# Patient Record
Sex: Male | Born: 1975 | Race: Black or African American | Hispanic: No | Smoking: Current every day smoker
Health system: Southern US, Community
[De-identification: ages and names within clinical notes are randomized; demographics above are authoritative.]

## PROBLEM LIST (undated history)

## (undated) DIAGNOSIS — E7800 Pure hypercholesterolemia, unspecified: Secondary | ICD-10-CM

## (undated) DIAGNOSIS — R569 Unspecified convulsions: Secondary | ICD-10-CM

## (undated) DIAGNOSIS — G4730 Sleep apnea, unspecified: Secondary | ICD-10-CM

## (undated) DIAGNOSIS — K219 Gastro-esophageal reflux disease without esophagitis: Secondary | ICD-10-CM

## (undated) DIAGNOSIS — I10 Essential (primary) hypertension: Secondary | ICD-10-CM

## (undated) DIAGNOSIS — E119 Type 2 diabetes mellitus without complications: Secondary | ICD-10-CM

## (undated) DIAGNOSIS — K852 Alcohol induced acute pancreatitis without necrosis or infection: Secondary | ICD-10-CM

## (undated) DIAGNOSIS — F32A Depression, unspecified: Secondary | ICD-10-CM

## (undated) DIAGNOSIS — F1010 Alcohol abuse, uncomplicated: Secondary | ICD-10-CM

## (undated) DIAGNOSIS — F329 Major depressive disorder, single episode, unspecified: Secondary | ICD-10-CM

## (undated) DIAGNOSIS — Z8739 Personal history of other diseases of the musculoskeletal system and connective tissue: Secondary | ICD-10-CM

## (undated) HISTORY — DX: Alcohol induced acute pancreatitis without necrosis or infection: K85.20

## (undated) HISTORY — DX: Personal history of other diseases of the musculoskeletal system and connective tissue: Z87.39

## (undated) HISTORY — DX: Type 2 diabetes mellitus without complications: E11.9

## (undated) HISTORY — PX: LUMBAR MICRODISCECTOMY: SHX99

## (undated) HISTORY — DX: Essential (primary) hypertension: I10

---

## 2001-02-06 ENCOUNTER — Emergency Department (HOSPITAL_COMMUNITY): Admission: EM | Admit: 2001-02-06 | Discharge: 2001-02-06 | Payer: Self-pay | Admitting: Emergency Medicine

## 2005-12-19 ENCOUNTER — Ambulatory Visit (HOSPITAL_COMMUNITY): Admission: RE | Admit: 2005-12-19 | Discharge: 2005-12-20 | Payer: Self-pay | Admitting: Neurological Surgery

## 2009-03-22 ENCOUNTER — Emergency Department (HOSPITAL_COMMUNITY): Admission: EM | Admit: 2009-03-22 | Discharge: 2009-03-22 | Payer: Self-pay | Admitting: Family Medicine

## 2009-03-22 ENCOUNTER — Inpatient Hospital Stay (HOSPITAL_COMMUNITY): Admission: EM | Admit: 2009-03-22 | Discharge: 2009-03-28 | Payer: Self-pay | Admitting: Emergency Medicine

## 2009-03-22 ENCOUNTER — Ambulatory Visit: Payer: Self-pay | Admitting: Infectious Diseases

## 2009-03-23 ENCOUNTER — Ambulatory Visit: Payer: Self-pay | Admitting: Internal Medicine

## 2009-07-10 ENCOUNTER — Encounter: Payer: Self-pay | Admitting: Internal Medicine

## 2010-01-18 ENCOUNTER — Emergency Department (HOSPITAL_COMMUNITY): Admission: EM | Admit: 2010-01-18 | Discharge: 2010-01-18 | Payer: Self-pay | Admitting: Emergency Medicine

## 2010-04-13 ENCOUNTER — Emergency Department (HOSPITAL_BASED_OUTPATIENT_CLINIC_OR_DEPARTMENT_OTHER)
Admission: EM | Admit: 2010-04-13 | Discharge: 2010-04-14 | Payer: Self-pay | Source: Home / Self Care | Admitting: Emergency Medicine

## 2010-04-14 ENCOUNTER — Encounter: Payer: Self-pay | Admitting: Internal Medicine

## 2010-04-14 ENCOUNTER — Inpatient Hospital Stay (HOSPITAL_COMMUNITY)
Admission: EM | Admit: 2010-04-14 | Discharge: 2010-04-17 | Payer: Self-pay | Attending: Internal Medicine | Admitting: Internal Medicine

## 2010-05-22 ENCOUNTER — Ambulatory Visit
Admission: RE | Admit: 2010-05-22 | Discharge: 2010-05-22 | Payer: Self-pay | Source: Home / Self Care | Attending: Internal Medicine | Admitting: Internal Medicine

## 2010-05-22 DIAGNOSIS — I10 Essential (primary) hypertension: Secondary | ICD-10-CM | POA: Insufficient documentation

## 2010-05-22 DIAGNOSIS — E1165 Type 2 diabetes mellitus with hyperglycemia: Secondary | ICD-10-CM | POA: Insufficient documentation

## 2010-05-22 LAB — HM DIABETES FOOT EXAM

## 2010-06-14 NOTE — Assessment & Plan Note (Signed)
Summary: new to est/ umr/mhf   Vital Signs:  Patient profile:   35 year old male Height:      70.5 inches Weight:      261.50 pounds BMI:     37.12 O2 Sat:      99 % on Room air Temp:     97.5 degrees F oral Pulse rate:   65 / minute Resp:     18 per minute BP sitting:   140 / 90  Vitals Entered By: Glendell Docker CMA (May 22, 2010 2:40 PM)  O2 Flow:  Room air CC: New Patient  Is Patient Diabetic? Yes Pain Assessment Patient in pain? no      Comments needs refill on Metformin, blood pressure has been elevated, newly diagnosed DM in hospital   Primary Care Provider:  D. Thomos Lemons DO  CC:  New Patient .  History of Present Illness: 35 y/o male to establish he as hx of recurrent alcoholic pancreatitis on last hosp - they noted elevated A1c ( new onset DM II) has not been to diabetic educator  graduated college weighed 200 lbs  - current wt 261 lbs poor diet   Preventive Screening-Counseling & Management  Alcohol-Tobacco     Alcohol drinks/day: 0     Smoking Status: never  Caffeine-Diet-Exercise     Caffeine use/day: 2 beverages daily     Does Patient Exercise: yes     Type of exercise: walking     Times/week: 5  Allergies (verified): No Known Drug Allergies  Past History:  Past Medical History: Hx of recurrent alcoholic pancreatitis Diabetes mellitus, type II Hypertension Hx of low back pain with  herniated disc L5 S1 with  right lumbar radiculopathy s/p right lumbar microdiskectomy 11/2001    Dr. 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  The patient denies vision  loss, chest pain, dyspnea on exertion, abdominal pain, melena, hematochezia, severe indigestion/heartburn, and depression.    Physical Exam  General:  alert, well-developed, and well-nourished.   Head:  normocephalic and atraumatic.   Eyes:  pupils equal, pupils round, and pupils reactive to light.   Ears:  R ear normal and L ear normal.   Mouth:  pharynx pink and moist.   Neck:  No deformities, masses, or tenderness noted. Lungs:  normal respiratory effort and normal breath sounds.   Heart:  normal rate, regular rhythm, no murmur, and no gallop.   Abdomen:  soft, non-tender, normal bowel sounds, no masses, no hepatomegaly, and no splenomegaly.   Extremities:  trace left pedal edema and trace right pedal edema.   Neurologic:  cranial nerves II-XII intact and gait normal.    Diabetes Management Exam:    Foot Exam (with socks and/or shoes not present):       Inspection:          Left foot: normal          Right foot: normal   Impression & Recommendations:  Problem # 1:  DIABETES MELLITUS, TYPE II (ICD-250.00) Assessment New start metformin.  DM II education provided. Pt  counseled on diet and exercise.  The following medications were removed from the medication list:    Metformin Hcl 500 Mg Tabs (Metformin hcl) .Marland Kitchen... Take 1 tablet by mouth two times a day with meals His updated medication list for this problem includes:    Benicar Hct 20-12.5 Mg Tabs (Olmesartan medoxomil-hctz) ..... One by mouth qam    Metformin Hcl 500 Mg Tabs (Metformin hcl) .Marland Kitchen... 1 and 1/2 tab by mouth two times a day  Problem # 2:  HYPERTENSION (ICD-401.9) poorly controlled.  add benicar hctz  His updated medication list for this problem includes:    Zebeta 10 Mg Tabs (Bisoprolol fumarate) .Marland Kitchen... Take 1 tab by mouth at bedtime    Benicar Hct 20-12.5 Mg Tabs (Olmesartan medoxomil-hctz) ..... One by mouth qam  BP today: 140/90  Complete Medication List: 1)  Nexium 40 Mg Cpdr (Esomeprazole magnesium) ....  Take 1 capsule by mouth at bedtime 2)  Alprazolam 0.5 Mg Tabs (Alprazolam) .... 1/2 tablet by mouth  every morning and one tablet by mouth at bedtime as needed 3)  Zebeta 10 Mg Tabs (Bisoprolol fumarate) .... Take 1 tab by mouth at bedtime 4)  Benicar Hct 20-12.5 Mg Tabs (Olmesartan medoxomil-hctz) .... One by mouth qam 5)  Metformin Hcl 500 Mg Tabs (Metformin hcl) .Marland Kitchen.. 1 and 1/2 tab by mouth two times a day 6)  Onetouch Ultra Blue Strp (Glucose blood) .... Use once daily as directed 7)  Onetouch Delica Lancets Misc (Lancets) .... Use once daily as directed  Patient Instructions: 1)  Please schedule a follow-up appointment in 1 month. 2)  BMP prior to visit, ICD-9:  401.9 3)  Hepatic Panel prior to visit, ICD-9: 250.00 4)  Lipid Panel prior to visit, ICD-9: 250.00 5)  TSH prior to visit, ICD-9: 401.9 6)  HbgA1C prior to visit, ICD-9: 250.00 7)  Urine Microalbumin prior to visit, ICD-9: 250.00 8)  Please return for lab work one (1) week before your next appointment.  Prescriptions: NEXIUM 40 MG CPDR (ESOMEPRAZOLE MAGNESIUM) Take 1 capsule by mouth at bedtime  #30 x 5   Entered and Authorized by:   D. Thomos Lemons DO   Signed by:   D. Thomos Lemons DO on 05/22/2010   Method used:   Electronically to        [facility]* (retail)       [address]       [address] Shipping/mailing       [place], [place]  [address]       Ph: [phone]       Fax: [phone]   RxID:   [id number] ALPRAZOLAM 0.5 MG TABS (ALPRAZOLAM) 1/2 tablet by mouth  every morning and one tablet by mouth at bedtime as needed  #30 x 3   Entered and Authorized by:   D. Thomos Lemons DO   Signed by:   D. Thomos Lemons DO on 05/22/2010   Method used:   Print then Give to Patient   RxID:   [id number] [facility] DELICA LANCETS  MISC (LANCETS) use once daily as directed  #30 x 3   Entered and Authorized by:   D. Thomos Lemons DO   Signed by:   D. Thomos Lemons DO on 05/22/2010   Method used:    Electronically to        [facility]* (retail)       [address]       [address] Shipping/mailing       [place], [place]  [address]       Ph: [phone]       Fax: [phone]   RxID:   [id number] ONETOUCH ULTRA BLUE  STRP (GLUCOSE BLOOD) use once daily as directed  #30 x 3   Entered and Authorized by:   D. Thomos Lemons DO   Signed by:   D. Thomos Lemons DO on 05/22/2010   Method used:   Electronically to        [facility]* (retail)       [address]       [address] Shipping/mailing       [place], [place]  [address]       Ph: [phone]       Fax: [phone]   RxID:   [id number] METFORMIN HCL 500 MG TABS (METFORMIN HCL) 1 and 1/2 tab by mouth two times a day  #90 x 3   Entered and Authorized by:   D. Thomos Lemons DO   Signed by:   D. Thomos Lemons DO on 05/22/2010   Method used:   Electronically to        [facility]* (retail)       [address]       [address] Shipping/mailing       [place], [place]  [address]       Ph: [phone]       Fax: [phone]   RxID:   [id number] BENICAR HCT 20-12.5 MG TABS (OLMESARTAN MEDOXOMIL-HCTZ) one by mouth qAM  #30 x 2   Entered and Authorized by:   D. Thomos Lemons DO   Signed by:   D. Thomos Lemons DO on 05/22/2010   Method used:   Electronically to        [facility]* (retail)       [address]       [address] Shipping/mailing       [place], [place]  [address]       Ph: [phone]       Fax: [phone]   RxID:   [id number]    Orders Added: 1)  New Patient Level III [614[address]]   Immunization History:  Influenza Immunization History:    Influenza:  historical (04/16/2010)   Immunization History:  Influenza Immunization History:    Influenza:  Historical (04/16/2010)   Current Allergies (reviewed today): No known allergies

## 2010-06-25 ENCOUNTER — Encounter: Payer: Self-pay | Admitting: Internal Medicine

## 2010-06-25 LAB — CONVERTED CEMR LAB
ALT: 39 units/L (ref 0–53)
AST: 27 units/L (ref 0–37)
Albumin: 5.1 g/dL (ref 3.5–5.2)
Bilirubin, Direct: 0.1 mg/dL (ref 0.0–0.3)
CO2: 28 meq/L (ref 19–32)
Cholesterol: 188 mg/dL (ref 0–200)
Glucose, Bld: 96 mg/dL (ref 70–99)
HDL: 37 mg/dL — ABNORMAL LOW (ref >39)
Hgb A1c MFr Bld: 6.3 % — ABNORMAL HIGH (ref <5.7)
Microalb Creat Ratio: 6.9 mg/g (ref 0.0–30.0)
Microalb, Ur: 0.78 mg/dL (ref 0.00–1.89)
Potassium: 3.8 meq/L (ref 3.5–5.3)
Sodium: 138 meq/L (ref 135–145)
TSH: 2.436 microintl units/mL (ref 0.350–4.500)
Total CHOL/HDL Ratio: 5.1
VLDL: 23 mg/dL (ref 0–40)

## 2010-06-27 ENCOUNTER — Encounter: Payer: Self-pay | Admitting: Internal Medicine

## 2010-06-27 ENCOUNTER — Ambulatory Visit (INDEPENDENT_AMBULATORY_CARE_PROVIDER_SITE_OTHER): Payer: 59 | Admitting: Internal Medicine

## 2010-06-27 DIAGNOSIS — I10 Essential (primary) hypertension: Secondary | ICD-10-CM

## 2010-06-27 DIAGNOSIS — E785 Hyperlipidemia, unspecified: Secondary | ICD-10-CM | POA: Insufficient documentation

## 2010-06-27 DIAGNOSIS — E119 Type 2 diabetes mellitus without complications: Secondary | ICD-10-CM

## 2010-07-05 NOTE — Assessment & Plan Note (Signed)
Summary: 1 month follow up/mhf   Vital Signs:  Patient profile:   35 year old male Height:      70.5 inches Weight:      257.25 pounds BMI:     36.52 O2 Sat:      99 % on Room air Temp:     98.8 degrees F oral Pulse rate:   71 / minute Resp:     20 per minute BP sitting:   130 / 80  (right arm) Cuff size:   large  Vitals Entered By: Glendell Docker CMA (June 27, 2010 10:00 AM)  O2 Flow:  Room air CC: 1 Month follow up Is Patient Diabetic? Yes Pain Assessment Patient in pain? no      Comments low blood sugar 90 high 119 avg 90's   Primary Care Provider:  Dondra Spry DO  CC:  1 Month follow up.  History of Present Illness: 35 y/o male for diabetes f/u doing well,  lost 4 lbs family working on eliminating sugars in diet tolerating metformin A1c improved  Htn - no side effects from benicar.  denies dizziness or light headedness reveiwed lipid panel LDL is suboptimal.  goal LDL < 70 he has never taken statin in the past      Preventive Screening-Counseling & Management  Alcohol-Tobacco     Smoking Status: never  Allergies (verified): No Known Drug Allergies  Past History:  Past Medical History: Hx of recurrent alcoholic pancreatitis Diabetes mellitus, type II - new onset 03/2010 Hypertension Hx of low back pain with  herniated disc L5 S1 with  right lumbar radiculopathy s/p right lumbar microdiskectomy 11/2001    Dr. 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   Lungs:  normal respiratory effort and normal breath sounds.   Heart:  normal rate, regular rhythm, no murmur, and no gallop.   Extremities:  trace left pedal edema and trace right pedal  edema.     Impression & Recommendations:  Problem # 1:  HYPERTENSION (ICD-401.9) Assessment Improved bp at goal.  Maintain current medication regimen.  His updated medication list for this problem includes:    Zebeta 10 Mg Tabs (Bisoprolol fumarate) .Marland Kitchen... Take 1 tab by mouth at bedtime    Benicar Hct 20-12.5 Mg Tabs (Olmesartan medoxomil-hctz) ..... One by mouth qam  BP today: 130/80 Prior BP: 140/90 (05/22/2010)  Labs Reviewed: K+: 3.8 (06/25/2010) Creat: : 0.97 (06/25/2010)   Chol: 188 (06/25/2010)   HDL: 37 (06/25/2010)   LDL: 128 (06/25/2010)   TG: 115 (06/25/2010)  Problem # 2:  DIABETES MELLITUS, TYPE II (ICD-250.00) Assessment: Improved  add statin, and baby aspirin encouraged daily exercise program  His updated medication list for this problem includes:    Benicar Hct 20-12.5 Mg Tabs (Olmesartan medoxomil-hctz) ..... One by mouth qam    Metformin Hcl 500 Mg Tabs (Metformin hcl) .Marland Kitchen... 1 and 1/2 tab by mouth two times a day    Aspirin Ec 81 Mg Tbec (Aspirin) ..... One  by mouth once daily  Labs Reviewed: Creat: 0.97 (06/25/2010)    Reviewed HgBA1c results: 6.3 (06/25/2010)  7.1 (04/14/2010)  Orders: Ophthalmology Referral (Ophthalmology)  Problem # 3:  HYPERLIPIDEMIA (  ICD-272.4) goal LDL < 70.  we discussed potential side effects  His updated medication list for this problem includes:    Atorvastatin Calcium 10 Mg Tabs (Atorvastatin calcium) ..... One by mouth once daily  Labs Reviewed: SGOT: 27 (06/25/2010)   SGPT: 39 (06/25/2010)   HDL:37 (06/25/2010)  LDL:128 (06/25/2010)  Chol:188 (06/25/2010)  Trig:115 (06/25/2010)  Complete Medication List: 1)  Nexium 40 Mg Cpdr (Esomeprazole magnesium) .... Take 1 capsule by mouth at bedtime 2)  Alprazolam 0.5 Mg Tabs (Alprazolam) .... 1/2 tablet by mouth  every morning and one tablet by mouth at bedtime as needed 3)  Zebeta 10 Mg Tabs (Bisoprolol fumarate) .... Take 1 tab by mouth at bedtime 4)  Benicar Hct 20-12.5  Mg Tabs (Olmesartan medoxomil-hctz) .... One by mouth qam 5)  Metformin Hcl 500 Mg Tabs (Metformin hcl) .Marland Kitchen.. 1 and 1/2 tab by mouth two times a day 6)  Onetouch Ultra Blue Strp (Glucose blood) .... Use once daily as directed 7)  Onetouch Delica Lancets Misc (Lancets) .... Use once daily as directed 8)  Atorvastatin Calcium 10 Mg Tabs (Atorvastatin calcium) .... One by mouth once daily 9)  Aspirin Ec 81 Mg Tbec (Aspirin) .... One  by mouth once daily  Patient Instructions: 1)  Please schedule a follow-up appointment in 4 months. 2)  BMP prior to visit, ICD-9:  401.9 3)  HbgA1C prior to visit, ICD-9:  250.00 4)  FLP, AST, AST - 272.4 5)  Please return for lab work one (1) week before your next appointment.  Prescriptions: BENICAR HCT 20-12.5 MG TABS (OLMESARTAN MEDOXOMIL-HCTZ) one by mouth qAM  #90 x 1   Entered and Authorized by:   D. Thomos Lemons DO   Signed by:   D. Thomos Lemons DO on 06/27/2010   Method used:   Electronically to        [facility]* (retail)       [address]       [address] Shipping/mailing       [place], [place]  [address]       Ph: [phone]       Fax: [phone]   RxID:   [id number] ATORVASTATIN CALCIUM 10 MG TABS (ATORVASTATIN CALCIUM) one by mouth once daily  #90 x 1   Entered and Authorized by:   D. Thomos Lemons DO   Signed by:   D. Thomos Lemons DO on 06/27/2010   Method used:   Electronically to        [facility]* (retail)       [address]       [address] Shipping/mailing       [place], [place]  [address]       Ph: [phone]       Fax: [phone]   RxID:   [id number]    Orders Added: 1)  Est. Patient Level III [06301] 2)  Ophthalmology Referral [Ophthalmology]    Current Allergies (reviewed today): No known allergies

## 2010-07-05 NOTE — Miscellaneous (Signed)
Summary: lab order  Clinical Lists Changes  Orders: Added new Test order of T-Basic Metabolic Panel [phone]) - Signed Added new Test order of T-TSH [phone]) - Signed Added new Test order of T-Hepatic Function [phone]) - Signed Added new Test order of T-Lipid Profile [phone]) - Signed Added new Test order of T- Hemoglobin A1C ([phone]) - Signed Added new Test order of T-Urine Microalbumin w/creat. ratio [id number]) - Signed

## 2010-07-09 LAB — COMPREHENSIVE METABOLIC PANEL
ALT: 64 U/L — ABNORMAL HIGH (ref 0–53)
ALT: 75 U/L — ABNORMAL HIGH (ref 0–53)
ALT: 122 U/L — ABNORMAL HIGH (ref 0–53)
AST: 31 U/L (ref 0–37)
AST: 64 U/L — ABNORMAL HIGH (ref 0–37)
AST: 77 U/L — ABNORMAL HIGH (ref 0–37)
Albumin: 3.3 g/dL — ABNORMAL LOW (ref 3.5–5.2)
Albumin: 3.4 g/dL — ABNORMAL LOW (ref 3.5–5.2)
Albumin: 4.3 g/dL (ref 3.5–5.2)
Alkaline Phosphatase: 57 U/L (ref 39–117)
Alkaline Phosphatase: 58 U/L (ref 39–117)
Alkaline Phosphatase: 90 U/L (ref 39–117)
BUN: 5 mg/dL — ABNORMAL LOW (ref 6–23)
BUN: 6 mg/dL (ref 6–23)
BUN: 9 mg/dL (ref 6–23)
CO2: 28 mEq/L (ref 19–32)
CO2: 29 mEq/L (ref 19–32)
CO2: 24 mEq/L (ref 19–32)
Calcium: 8.8 mg/dL (ref 8.4–10.5)
Calcium: 8.9 mg/dL (ref 8.4–10.5)
Calcium: 9.3 mg/dL (ref 8.4–10.5)
Chloride: 100 mEq/L (ref 96–112)
Chloride: 103 mEq/L (ref 96–112)
Chloride: 101 mEq/L (ref 96–112)
Creatinine, Ser: 0.92 mg/dL (ref 0.4–1.5)
Creatinine, Ser: 0.98 mg/dL (ref 0.4–1.5)
Creatinine, Ser: 0.8 mg/dL (ref 0.4–1.5)
GFR calc Af Amer: >60 mL/min (ref >60)
GFR calc Af Amer: >60 mL/min (ref >60)
GFR calc Af Amer: >60 mL/min (ref >60)
GFR calc non Af Amer: >60 mL/min (ref >60)
GFR calc non Af Amer: >60 mL/min (ref >60)
GFR calc non Af Amer: >60 mL/min (ref >60)
Glucose, Bld: 114 mg/dL — ABNORMAL HIGH (ref 70–99)
Glucose, Bld: 117 mg/dL — ABNORMAL HIGH (ref 70–99)
Glucose, Bld: 188 mg/dL — ABNORMAL HIGH (ref 70–99)
Potassium: 3.2 mEq/L — ABNORMAL LOW (ref 3.5–5.1)
Potassium: 3.3 mEq/L — ABNORMAL LOW (ref 3.5–5.1)
Potassium: 3.4 mEq/L — ABNORMAL LOW (ref 3.5–5.1)
Sodium: 137 mEq/L (ref 135–145)
Sodium: 137 mEq/L (ref 135–145)
Sodium: 141 mEq/L (ref 135–145)
Total Bilirubin: 0.9 mg/dL (ref 0.3–1.2)
Total Bilirubin: 1 mg/dL (ref 0.3–1.2)
Total Bilirubin: 0.9 mg/dL (ref 0.3–1.2)
Total Protein: 6.4 g/dL (ref 6.0–8.3)
Total Protein: 6.6 g/dL (ref 6.0–8.3)
Total Protein: 7.8 g/dL (ref 6.0–8.3)

## 2010-07-09 LAB — DIFFERENTIAL
Basophils Absolute: 0 10*3/uL (ref 0.0–0.1)
Basophils Relative: 0 % (ref 0–1)
Eosinophils Absolute: 0.1 10*3/uL (ref 0.0–0.7)
Eosinophils Absolute: 0.2 10*3/uL (ref 0.0–0.7)
Eosinophils Relative: 0 % (ref 0–5)
Eosinophils Relative: 1 % (ref 0–5)
Lymphocytes Relative: 8 % — ABNORMAL LOW (ref 12–46)
Lymphs Abs: 1.5 10*3/uL (ref 0.7–4.0)
Monocytes Absolute: 0.9 10*3/uL (ref 0.1–1.0)

## 2010-07-09 LAB — GLUCOSE, CAPILLARY
Glucose-Capillary: 101 mg/dL — ABNORMAL HIGH (ref 70–99)
Glucose-Capillary: 102 mg/dL — ABNORMAL HIGH (ref 70–99)
Glucose-Capillary: 105 mg/dL — ABNORMAL HIGH (ref 70–99)
Glucose-Capillary: 117 mg/dL — ABNORMAL HIGH (ref 70–99)
Glucose-Capillary: 118 mg/dL — ABNORMAL HIGH (ref 70–99)
Glucose-Capillary: 121 mg/dL — ABNORMAL HIGH (ref 70–99)
Glucose-Capillary: 121 mg/dL — ABNORMAL HIGH (ref 70–99)
Glucose-Capillary: 123 mg/dL — ABNORMAL HIGH (ref 70–99)
Glucose-Capillary: 123 mg/dL — ABNORMAL HIGH (ref 70–99)
Glucose-Capillary: 128 mg/dL — ABNORMAL HIGH (ref 70–99)
Glucose-Capillary: 130 mg/dL — ABNORMAL HIGH (ref 70–99)
Glucose-Capillary: 133 mg/dL — ABNORMAL HIGH (ref 70–99)
Glucose-Capillary: 198 mg/dL — ABNORMAL HIGH (ref 70–99)

## 2010-07-09 LAB — BASIC METABOLIC PANEL
BUN: 7 mg/dL (ref 6–23)
CO2: 28 mEq/L (ref 19–32)
Chloride: 99 mEq/L (ref 96–112)
Creatinine, Ser: 0.99 mg/dL (ref 0.4–1.5)
Glucose, Bld: 167 mg/dL — ABNORMAL HIGH (ref 70–99)
Potassium: 4.6 mEq/L (ref 3.5–5.1)

## 2010-07-09 LAB — CBC
HCT: 42.5 % (ref 39.0–52.0)
Hemoglobin: 15.7 g/dL (ref 13.0–17.0)
MCH: 29.5 pg (ref 26.0–34.0)
MCHC: 35.3 g/dL (ref 30.0–36.0)
MCV: 83.5 fL (ref 78.0–100.0)
Platelets: 237 10*3/uL (ref 150–400)
RBC: 5.39 MIL/uL (ref 4.22–5.81)
RDW: 13.2 % (ref 11.5–15.5)
WBC: 12.5 10*3/uL — ABNORMAL HIGH (ref 4.0–10.5)

## 2010-07-09 LAB — LIPASE, BLOOD
Lipase: 1146 U/L — ABNORMAL HIGH (ref 11–59)
Lipase: 134 U/L — ABNORMAL HIGH (ref 11–59)
Lipase: 192 U/L — ABNORMAL HIGH (ref 11–59)
Lipase: >2000 U/L — ABNORMAL HIGH (ref 23–300)

## 2010-07-09 LAB — LIPID PANEL
Cholesterol: 195 mg/dL (ref 0–200)
LDL Cholesterol: 139 mg/dL — ABNORMAL HIGH (ref 0–99)
Total CHOL/HDL Ratio: 6.3 RATIO

## 2010-07-09 LAB — HEMOGLOBIN A1C: Hgb A1c MFr Bld: 7.1 % — ABNORMAL HIGH (ref <5.7)

## 2010-07-09 LAB — URINE MICROSCOPIC-ADD ON

## 2010-07-09 LAB — HEPATIC FUNCTION PANEL
ALT: 92 U/L — ABNORMAL HIGH (ref 0–53)
Bilirubin, Direct: 0.2 mg/dL (ref 0.0–0.3)
Indirect Bilirubin: 0.4 mg/dL (ref 0.3–0.9)
Total Protein: 7.1 g/dL (ref 6.0–8.3)

## 2010-07-09 LAB — ETHANOL: Alcohol, Ethyl (B): <5 mg/dL (ref 0–10)

## 2010-07-09 LAB — URINALYSIS, ROUTINE W REFLEX MICROSCOPIC
Bilirubin Urine: NEGATIVE
Ketones, ur: 15 mg/dL — AB
Nitrite: NEGATIVE
pH: 6 (ref 5.0–8.0)

## 2010-07-12 LAB — POCT I-STAT, CHEM 8
Creatinine, Ser: 1 mg/dL (ref 0.4–1.5)
Hemoglobin: 16.7 g/dL (ref 13.0–17.0)
Potassium: 3.7 mEq/L (ref 3.5–5.1)
Sodium: 135 mEq/L (ref 135–145)

## 2010-07-12 LAB — POCT URINALYSIS DIPSTICK
Glucose, UA: NEGATIVE mg/dL
Nitrite: NEGATIVE
Specific Gravity, Urine: 1.01 (ref 1.005–1.030)
Urobilinogen, UA: 0.2 mg/dL (ref 0.0–1.0)

## 2010-07-12 LAB — LIPASE, BLOOD: Lipase: 506 U/L — ABNORMAL HIGH (ref 11–59)

## 2010-08-01 LAB — HOMOCYSTEINE: Homocysteine: 4.9 umol/L (ref 4.0–15.4)

## 2010-08-01 LAB — COMPREHENSIVE METABOLIC PANEL
AST: 45 U/L — ABNORMAL HIGH (ref 0–37)
Albumin: 2.7 g/dL — ABNORMAL LOW (ref 3.5–5.2)
Alkaline Phosphatase: 49 U/L (ref 39–117)
Alkaline Phosphatase: 64 U/L (ref 39–117)
BUN: 5 mg/dL — ABNORMAL LOW (ref 6–23)
BUN: 5 mg/dL — ABNORMAL LOW (ref 6–23)
BUN: 8 mg/dL (ref 6–23)
CO2: 26 mEq/L (ref 19–32)
Calcium: 8.4 mg/dL (ref 8.4–10.5)
Chloride: 106 mEq/L (ref 96–112)
Creatinine, Ser: 1.37 mg/dL (ref 0.4–1.5)
GFR calc Af Amer: >60 mL/min (ref >60)
GFR calc non Af Amer: >60 mL/min (ref >60)
Glucose, Bld: 118 mg/dL — ABNORMAL HIGH (ref 70–99)
Potassium: 3.3 mEq/L — ABNORMAL LOW (ref 3.5–5.1)
Potassium: 3.9 mEq/L (ref 3.5–5.1)
Total Bilirubin: 1.2 mg/dL (ref 0.3–1.2)
Total Bilirubin: 1.5 mg/dL — ABNORMAL HIGH (ref 0.3–1.2)
Total Protein: 6.4 g/dL (ref 6.0–8.3)

## 2010-08-01 LAB — LIPID PANEL
HDL: 41 mg/dL (ref >39)
LDL Cholesterol: 98 mg/dL (ref 0–99)
Triglycerides: 195 mg/dL — ABNORMAL HIGH (ref <150)

## 2010-08-01 LAB — POCT I-STAT, CHEM 8
BUN: 5 mg/dL — ABNORMAL LOW (ref 6–23)
Calcium, Ion: 1.11 mmol/L — ABNORMAL LOW (ref 1.12–1.32)
Creatinine, Ser: 0.9 mg/dL (ref 0.4–1.5)
TCO2: 22 mmol/L (ref 0–100)

## 2010-08-01 LAB — DIFFERENTIAL
Basophils Absolute: 0 10*3/uL (ref 0.0–0.1)
Basophils Relative: 0 % (ref 0–1)
Lymphocytes Relative: 6 % — ABNORMAL LOW (ref 12–46)
Lymphs Abs: 1.1 10*3/uL (ref 0.7–4.0)
Monocytes Absolute: 0.7 10*3/uL (ref 0.1–1.0)
Monocytes Relative: 4 % (ref 3–12)
Neutro Abs: 15.9 10*3/uL — ABNORMAL HIGH (ref 1.7–7.7)
Neutro Abs: 19 10*3/uL — ABNORMAL HIGH (ref 1.7–7.7)
Neutrophils Relative %: 92 % — ABNORMAL HIGH (ref 43–77)

## 2010-08-01 LAB — CBC
HCT: 38 % — ABNORMAL LOW (ref 39.0–52.0)
HCT: 38.3 % — ABNORMAL LOW (ref 39.0–52.0)
HCT: 38.3 % — ABNORMAL LOW (ref 39.0–52.0)
HCT: 39.7 % (ref 39.0–52.0)
HCT: 44.4 % (ref 39.0–52.0)
HCT: 52.4 % — ABNORMAL HIGH (ref 39.0–52.0)
Hemoglobin: 13.2 g/dL (ref 13.0–17.0)
Hemoglobin: 13.3 g/dL (ref 13.0–17.0)
Hemoglobin: 13.4 g/dL (ref 13.0–17.0)
Hemoglobin: 15.5 g/dL (ref 13.0–17.0)
Hemoglobin: 16.6 g/dL (ref 13.0–17.0)
MCHC: 34.9 g/dL (ref 30.0–36.0)
MCHC: 35.4 g/dL (ref 30.0–36.0)
MCV: 88.8 fL (ref 78.0–100.0)
MCV: 89.3 fL (ref 78.0–100.0)
Platelets: 170 10*3/uL (ref 150–400)
Platelets: 258 10*3/uL (ref 150–400)
Platelets: 353 10*3/uL (ref 150–400)
RBC: 4.29 MIL/uL (ref 4.22–5.81)
RBC: 4.31 MIL/uL (ref 4.22–5.81)
RBC: 5.37 MIL/uL (ref 4.22–5.81)
RBC: 5.91 MIL/uL — ABNORMAL HIGH (ref 4.22–5.81)
RDW: 12.8 % (ref 11.5–15.5)
RDW: 13.2 % (ref 11.5–15.5)
RDW: 13.3 % (ref 11.5–15.5)
RDW: 13.5 % (ref 11.5–15.5)
WBC: 11.4 10*3/uL — ABNORMAL HIGH (ref 4.0–10.5)
WBC: 13.5 10*3/uL — ABNORMAL HIGH (ref 4.0–10.5)
WBC: 17.9 10*3/uL — ABNORMAL HIGH (ref 4.0–10.5)
WBC: 20.6 10*3/uL — ABNORMAL HIGH (ref 4.0–10.5)

## 2010-08-01 LAB — CARDIAC PANEL(CRET KIN+CKTOT+MB+TROPI)
CK, MB: 1.9 ng/mL (ref 0.3–4.0)
CK, MB: 2 ng/mL (ref 0.3–4.0)
Relative Index: 1.9 (ref 0.0–2.5)
Total CK: 110 U/L (ref 7–232)
Troponin I: 0.02 ng/mL (ref 0.00–0.06)

## 2010-08-01 LAB — CARDIOLIPIN ANTIBODIES, IGG, IGM, IGA
Anticardiolipin IgA: <3 APL U/mL — ABNORMAL LOW (ref <10)
Anticardiolipin IgG: 5 GPL U/mL — ABNORMAL LOW (ref <10)

## 2010-08-01 LAB — HEPATIC FUNCTION PANEL
ALT: 47 U/L (ref 0–53)
AST: 47 U/L — ABNORMAL HIGH (ref 0–37)
Alkaline Phosphatase: 85 U/L (ref 39–117)
Bilirubin, Direct: 0.3 mg/dL (ref 0.0–0.3)
Total Bilirubin: 1.3 mg/dL — ABNORMAL HIGH (ref 0.3–1.2)

## 2010-08-01 LAB — BASIC METABOLIC PANEL
BUN: 4 mg/dL — ABNORMAL LOW (ref 6–23)
BUN: 8 mg/dL (ref 6–23)
CO2: 29 mEq/L (ref 19–32)
Calcium: 8.3 mg/dL — ABNORMAL LOW (ref 8.4–10.5)
Chloride: 104 mEq/L (ref 96–112)
GFR calc non Af Amer: >60 mL/min (ref >60)
Glucose, Bld: 118 mg/dL — ABNORMAL HIGH (ref 70–99)
Glucose, Bld: 126 mg/dL — ABNORMAL HIGH (ref 70–99)
Potassium: 3.2 mEq/L — ABNORMAL LOW (ref 3.5–5.1)
Potassium: 3.5 mEq/L (ref 3.5–5.1)

## 2010-08-01 LAB — GLUCOSE, CAPILLARY: Glucose-Capillary: 141 mg/dL — ABNORMAL HIGH (ref 70–99)

## 2010-08-01 LAB — BILIRUBIN, FRACTIONATED(TOT/DIR/INDIR)
Bilirubin, Direct: 0.6 mg/dL — ABNORMAL HIGH (ref 0.0–0.3)
Total Bilirubin: 1.6 mg/dL — ABNORMAL HIGH (ref 0.3–1.2)

## 2010-08-01 LAB — ANA: Anti Nuclear Antibody(ANA): NEGATIVE

## 2010-08-01 LAB — HEMOGLOBIN A1C: Hgb A1c MFr Bld: 5.8 % (ref 4.6–6.1)

## 2010-08-01 LAB — ANTI-SMOOTH MUSCLE ANTIBODY, IGG: F-Actin IgG: <20 U (ref <20)

## 2010-08-01 LAB — APTT: aPTT: 35 seconds (ref 24–37)

## 2010-08-01 LAB — POCT CARDIAC MARKERS
Myoglobin, poc: 69.1 ng/mL (ref 12–200)
Troponin i, poc: <0.05 ng/mL (ref 0.00–0.09)
Troponin i, poc: <0.05 ng/mL (ref 0.00–0.09)

## 2010-08-01 LAB — ANTI-NEUTROPHIL ANTIBODY

## 2010-08-01 LAB — ANTITHROMBIN III: AntiThromb III Func: 93 % (ref 75–120)

## 2010-08-01 LAB — MAGNESIUM
Magnesium: 1.6 mg/dL (ref 1.5–2.5)
Magnesium: 2.3 mg/dL (ref 1.5–2.5)

## 2010-08-23 ENCOUNTER — Telehealth: Payer: Self-pay | Admitting: *Deleted

## 2010-08-23 NOTE — Telephone Encounter (Signed)
Pt called stating he has noticed some blood in the toilet and on his paper after bowel movements intermittent x 1 week.  Pt denies other symptoms at present. Advised pt of need for evaluation in the office. Pt only wants to see Dr Artist Pais for this and scheduled appt. For Monday 08/27/10 @ 2:15pm. Advised pt to go to the ER if symptoms worsen.

## 2010-08-23 NOTE — Telephone Encounter (Signed)
Ok for appt on 4/30 as long rectal bleeding is mild. Please make sure pt stopped taking aspirin If pt having difficulty to hard to pass stools, I suggest he started OTC stool softener (colace) and increase fluid intake

## 2010-08-24 NOTE — Telephone Encounter (Signed)
Call returned to patient at [phone], no answer. A detailed voice message was left informing patient per Dr Artist Pais instructions. He was advised to call back and leave message that he has received instructions,and status of aspirin

## 2010-08-26 ENCOUNTER — Encounter: Payer: Self-pay | Admitting: Internal Medicine

## 2010-08-27 ENCOUNTER — Encounter: Payer: Self-pay | Admitting: Internal Medicine

## 2010-08-27 ENCOUNTER — Ambulatory Visit (INDEPENDENT_AMBULATORY_CARE_PROVIDER_SITE_OTHER): Payer: 59 | Admitting: Internal Medicine

## 2010-08-27 VITALS — BP 126/90 | HR 68 | Temp 98.0°F | Resp 18 | Wt 258.0 lb

## 2010-08-27 DIAGNOSIS — K625 Hemorrhage of anus and rectum: Secondary | ICD-10-CM | POA: Insufficient documentation

## 2010-08-27 MED ORDER — BISOPROLOL FUMARATE 10 MG PO TABS: 10.0000 mg | ORAL_TABLET | Freq: Every day | ORAL | Status: DC

## 2010-08-27 NOTE — Patient Instructions (Signed)
Our office will contact you re:  GI referral Please use stool softener, and fiber supplement as directed. Please call our office if your rectal bleeding recurs or gets worse. Keep your next follow up appointment

## 2010-08-27 NOTE — Progress Notes (Signed)
  Subjective:    Patient ID: Willie [name], male    DOB: 01-Mar-1976, 35 y.o.   MRN: [id number]  HPI  35 y/o male c/o rectal bleeding.   He had previous episode of rectal bleeding 6-7 months ago  This episode lasted 3-4 days.  Commode is pink.  He noticed blood on tissue. He denies rectal pain  No family hx of colorectal cancer  Brother has lung cancer  Review of Systems No shortness of breath  Past Medical History  Diagnosis Date  . Alcoholic pancreatitis     recurrent  . Diabetes mellitus, type II     New Onset 03/2010  . Hypertension   . 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 S/P lumbar microdiscectomy 11/2001    Dr Danielle Dess    History   Social History  . Marital Status: Married    Spouse Name: N/A    Number of Children: N/A  . Years of Education: N/A   Occupational History  . Not on file.   Social History Main Topics  . Smoking status: Never Smoker   . Smokeless tobacco: Not on file  . Alcohol Use: No  . Drug Use: Not on file  . Sexually Active: Not on file   Other Topics Concern  . 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 Lumbar microdiscectomy     Dr Danielle Dess    Family History  Problem Relation Age of Onset  . Diabetes Mother   . Lung cancer Brother     twin brother    No Known Allergies  Current Outpatient Prescriptions on File Prior to Visit  Medication Sig Dispense Refill  . atorvastatin (LIPITOR) 10 MG tablet Take 10 mg by mouth daily.        Marland Kitchen esomeprazole (NEXIUM) 40 MG capsule Take 40 mg by mouth at bedtime.        Marland Kitchen glucose blood (ONE TOUCH TEST STRIPS) test strip 1 each. Use to check blood sugar once day       . ONE TOUCH LANCETS MISC by Does not apply route. Use to check blood sugar once a day         . aspirin 81 MG tablet Take 81 mg by mouth daily.          BP 126/90  Pulse 68  Temp(Src) 98 F (36.7 C) (Oral)  Resp 18  Wt 258 lb (117.028 kg)  SpO2 100%       Objective:   Physical Exam  Constitutional: He appears well-developed and well-nourished.  Cardiovascular: Normal rate, regular rhythm and normal heart sounds.   Pulmonary/Chest: Effort normal and breath sounds normal.  Abdominal: Soft. Bowel sounds are normal. He exhibits no mass.       RLQ tenderness Rectal exam:  Normal tone, no external hemorrhoids,  Internal hemorrhoid.  Hemoccult negative       Assessment & Plan:

## 2010-08-27 NOTE — Telephone Encounter (Signed)
Call placed to patient at [phone], no answer. Voice message left for patient regarding status. He is scheduled for 2:15pm today with Dr Artist Pais

## 2010-09-14 NOTE — Op Note (Signed)
NAMESTANFORD, Willie [name]             ACCOUNT NO.:  [id number]   MEDICAL RECORD NO.:  [id number]          PATIENT TYPE:  OIB   LOCATION:  3172                         FACILITY:  [facility]   PHYSICIAN:  Stefani Dama, M.D.  DATE OF BIRTH:  1975-11-22   DATE OF PROCEDURE:  12/19/2005  DATE OF DISCHARGE:                                 OPERATIVE REPORT   PREOPERATIVE DIAGNOSIS:  Herniated nucleus pulposus at L5-S1 right with  right lumbar radiculopathy.   POSTOPERATIVE DIAGNOSIS:  Herniated nucleus pulposus at L5-S1 right with  right lumbar radiculopathy.   PROCEDURE:  Right lumbar microdiskectomy at L5-S1 with the operating  microscope and microdissection technique.   SURGEON:  Dr. Barnett Abu.   FIRST ASSISTANT:  Dr. Maeola Harman.   ANESTHESIA:  General tracheal.   INDICATIONS:  Willie [name] is a 35 year old individual whose had  significant back and right lower extremity pain.  He has failed efforts at  conservative management.  MRI demonstrated the presence of a large extruded  fragment of disk behind the body of S1 from the L5-S1 disk space on the  right. The patient was advised regarding surgical extirpation of the disk.   DESCRIPTION OF PROCEDURE:  The patient was brought to the operating room  supine on the stretcher. After the smooth induction of general tracheal  anesthesia, he was turned prone, the back was prepped with DuraPrep, draped  in a sterile fashion.  A midline incision was created in the lower lumbar  spine and the L5-S1 interlaminar space was identified positively with a  singular radiograph. The interlaminar space was then cleared using a  monopolar cautery to remove the outer layer of the yellow ligament. A  laminotomy was then created removing the inferior margin of the lamina of L5  out to the mesial wall of the facet. The inner layer of the yellow ligament  was then taken up and the common dural tube was identified and this was  dissected out laterally  to identify the takeoff at the S1 nerve root which  was both dorsally. By identifying the lateral margin of the dural extent of  the S1 nerve root, this would be gently retracted medially off a significant  mass underneath this. The epidural veins in this area was then cauterized  and divided and with some gentle teasing several fragments of disk were  identified in this region underneath the S1 nerve root.  The fragments were  removed sequentially and then ultimately the S1 nerve root was noted to be  free and clear in its path out the foramen. More proximally over the area of  the disk space, there was noted to be an opening on the medial aspect of the  disk into the disk space.  This was then dissected further and an entry was  created into the disk space. With a 15 blade then an annulotomy was created  to enlarge this opening and the disk space was then evacuated of a  significant quantity of severely degenerated disk material. In the end, the  disk space was cleared of any free disk material that  could be obtained  through this aperture and the epidural space was checked for hemostasis.  The common dural tube and the S1 nerve root was well decompressed in its  travel out the foramen.  With this, the wound was copiously irrigated with  antibiotic irrigating solution, 15 mg of Toradol was  administered in the epidural space and then the lumbodorsal fascia was  closed with 2-0 Vicryl in interrupted fashion, 3-0 Vicryl was used in the  subcuticular tissues.  Dermabond was placed on the skin. The patient  tolerated the procedure well and was returned to the recovery in stable  condition.      Stefani Dama, M.D.  Electronically Signed     HJE/MEDQ  D:  12/19/2005  T:  12/20/2005  Job:  [id number]

## 2010-09-19 ENCOUNTER — Other Ambulatory Visit: Payer: Self-pay | Admitting: Internal Medicine

## 2010-09-20 NOTE — Telephone Encounter (Signed)
Rx refill sent to pharmacy. 

## 2010-09-21 ENCOUNTER — Telehealth: Payer: Self-pay | Admitting: Internal Medicine

## 2010-09-21 MED ORDER — ALPRAZOLAM 0.5 MG PO TABS: ORAL_TABLET | ORAL | Status: DC

## 2010-09-21 NOTE — Telephone Encounter (Signed)
Call placed to pharmacy at [phone] to verify patient instructions on Alprazolam. Spoke with Marta Antu, she stated patients most recent refill was at [facility] pharmacy in [place] in March for 1/2 twice a day and 1 at bedtime. She was informed our Rx directions read different than what was requested. She confirmed instructions from previous Rx's and she was advised to refill this one time only. Patient is due for follow up with Dr Artist Pais in June and clarification on instructions would be obtained at the office visit. Refill for Alprazolam x 1 called to pharmacy

## 2010-09-21 NOTE — Telephone Encounter (Signed)
Refill- alprazolam 0.5mg  tablet. Take 1/2 tablet by mouth twice daily and 1 tablet at bedtime as directed . Qty 60 last fill 3.7.12

## 2010-09-28 HISTORY — PX: COLONOSCOPY W/ BIOPSIES: SHX1374

## 2010-09-30 NOTE — Assessment & Plan Note (Signed)
35 y/o male with unexplained intermittent rectal bleeding.  Refer to GI for further eval.

## 2010-10-02 ENCOUNTER — Ambulatory Visit: Payer: 59 | Admitting: Gastroenterology

## 2010-10-09 ENCOUNTER — Ambulatory Visit (INDEPENDENT_AMBULATORY_CARE_PROVIDER_SITE_OTHER): Payer: 59 | Admitting: Gastroenterology

## 2010-10-09 ENCOUNTER — Encounter: Payer: Self-pay | Admitting: Gastroenterology

## 2010-10-09 ENCOUNTER — Other Ambulatory Visit (INDEPENDENT_AMBULATORY_CARE_PROVIDER_SITE_OTHER): Payer: 59

## 2010-10-09 VITALS — BP 132/84 | HR 76 | Ht 72.0 in | Wt 264.0 lb

## 2010-10-09 DIAGNOSIS — K625 Hemorrhage of anus and rectum: Secondary | ICD-10-CM

## 2010-10-09 LAB — CBC WITH DIFFERENTIAL/PLATELET
Basophils Relative: 0.4 % (ref 0.0–3.0)
Eosinophils Absolute: 0.2 10*3/uL (ref 0.0–0.7)
HCT: 40.8 % (ref 39.0–52.0)
Hemoglobin: 14 g/dL (ref 13.0–17.0)
Lymphs Abs: 2.4 10*3/uL (ref 0.7–4.0)
MCHC: 34.4 g/dL (ref 30.0–36.0)
MCV: 86.6 fl (ref 78.0–100.0)
Monocytes Absolute: 0.5 10*3/uL (ref 0.1–1.0)
Neutro Abs: 4.7 10*3/uL (ref 1.4–7.7)
Neutrophils Relative %: 59.5 % (ref 43.0–77.0)
RBC: 4.71 Mil/uL (ref 4.22–5.81)

## 2010-10-09 MED ORDER — PEG-KCL-NACL-NASULF-NA ASC-C 100 G PO SOLR: 1.0000 | Freq: Once | ORAL | Status: DC

## 2010-10-09 NOTE — Progress Notes (Signed)
HPI: This is a  very pleasant 35 year old man.  He noticed red rectal bleeding 4-5 weeks ago for about 2 weeks.  Had similar bleeding 5-6 months ago. The stool is solid, red blood around.  Changes color red.  PCP told him he had internal hemorrhoids.  No changes in bowels during bleeding.  He started fiber, intermittently. Has noticed much difference.  No NSAIDs.    No colon cancer in family. His twin brother has lung.  Has had admissions for acute pancreatitis, thought due to etoh use.  Last etoh use wsa 2 weeks ago, does not drink to excess anymore.    Overall stable, fluctuating weight.    He has had at least 2 well-documented episodes of acute pancreatitis. This was in 2010 and also 2011. He had pancreatic enzyme elevations well over 1000. Imaging studies showed no gallstones, his triglyceride levels were normal. He had been drinking a fair amount of alcohol prior to these and so the episodes were attributed to alcoholic pancreatitis.    Review of systems: Pertinent positive and negative review of systems were noted in the above HPI section.  All other review of systems was otherwise negative.     Physical Exam: Ht 6' (1.829 m)  Wt 264 lb (119.75 kg)  BMI 35.80 kg/m2 Constitutional: generally well-appearing Psychiatric: alert and oriented x3 Eyes: extraocular movements intact Mouth: oral pharynx moist, no lesions Neck: supple no lymphadenopathy Cardiovascular: heart regular rate and rhythm Lungs: clear to auscultation bilaterally Abdomen: soft, nontender, nondistended, no obvious ascites, no peritoneal signs, normal bowel sounds Extremities: no lower extremity edema bilaterally Skin: no lesions on visible extremities    Assessment and plan: 35 y.o. male with recent rectal bleeding  We will proceed with colonoscopy at his soonest convenience. He'll get a basic set of labs including a CBC to see if he is anemic however he is not appear to be so clinically.

## 2010-10-09 NOTE — Patient Instructions (Signed)
You will be set up for a colonoscopy. You will have labs checked today in the basement lab.  Please head down after you check out with the front desk  (cb[facility]). A copy of this information will be made available to Dr. Artist Pais.

## 2010-10-10 ENCOUNTER — Telehealth: Payer: Self-pay | Admitting: *Deleted

## 2010-10-10 NOTE — Telephone Encounter (Signed)
Message copied by Florene Glen on Wed Oct 10, 2010  3:33 PM ------      Message from: Rob Bunting P      Created: Wed Oct 10, 2010  8:26 AM                   Please call the patient.  The labs were all normal.  Should continue with the suggestions outlined at recent visit.

## 2010-10-10 NOTE — Telephone Encounter (Signed)
Informed pt his labs were normal per Dr Christella Hartigan; he should continue with suggestions made at his last OV. Pt stated understanding.

## 2010-10-22 ENCOUNTER — Encounter: Payer: Self-pay | Admitting: Gastroenterology

## 2010-10-22 ENCOUNTER — Ambulatory Visit (AMBULATORY_SURGERY_CENTER): Payer: 59 | Admitting: Gastroenterology

## 2010-10-22 VITALS — BP 148/77 | HR 65 | Resp 18 | Ht 72.0 in | Wt 254.0 lb

## 2010-10-22 DIAGNOSIS — K625 Hemorrhage of anus and rectum: Secondary | ICD-10-CM

## 2010-10-22 DIAGNOSIS — R933 Abnormal findings on diagnostic imaging of other parts of digestive tract: Secondary | ICD-10-CM

## 2010-10-22 DIAGNOSIS — K5730 Diverticulosis of large intestine without perforation or abscess without bleeding: Secondary | ICD-10-CM

## 2010-10-22 DIAGNOSIS — D126 Benign neoplasm of colon, unspecified: Secondary | ICD-10-CM

## 2010-10-22 LAB — GLUCOSE, CAPILLARY: Glucose-Capillary: 110 mg/dL — ABNORMAL HIGH (ref 70–99)

## 2010-10-22 MED ORDER — SODIUM CHLORIDE 0.9 % IV SOLN: 500.0000 mL | INTRAVENOUS | Status: DC

## 2010-10-22 NOTE — Patient Instructions (Signed)
Please review discharge instructions.

## 2010-10-23 ENCOUNTER — Telehealth: Payer: Self-pay | Admitting: *Deleted

## 2010-10-23 ENCOUNTER — Ambulatory Visit: Payer: 59 | Admitting: Internal Medicine

## 2010-10-23 NOTE — Telephone Encounter (Signed)
No answer, left message on voicemail.

## 2010-10-26 ENCOUNTER — Telehealth: Payer: Self-pay | Admitting: Gastroenterology

## 2010-10-26 NOTE — Telephone Encounter (Signed)
Patty, please call him.  Biopsies were all normal; he does not have colitis, inflammation.  No recall is needed  Dorene Grebe, no results letter is needed

## 2010-10-26 NOTE — Telephone Encounter (Signed)
Pt aware.

## 2010-11-13 ENCOUNTER — Encounter: Payer: Self-pay | Admitting: Family

## 2010-11-13 ENCOUNTER — Other Ambulatory Visit: Payer: Self-pay | Admitting: Family

## 2010-11-13 ENCOUNTER — Ambulatory Visit (INDEPENDENT_AMBULATORY_CARE_PROVIDER_SITE_OTHER): Payer: 59 | Admitting: Family

## 2010-11-13 DIAGNOSIS — F329 Major depressive disorder, single episode, unspecified: Secondary | ICD-10-CM

## 2010-11-13 DIAGNOSIS — E785 Hyperlipidemia, unspecified: Secondary | ICD-10-CM

## 2010-11-13 DIAGNOSIS — I10 Essential (primary) hypertension: Secondary | ICD-10-CM

## 2010-11-13 DIAGNOSIS — F418 Other specified anxiety disorders: Secondary | ICD-10-CM | POA: Insufficient documentation

## 2010-11-13 DIAGNOSIS — E119 Type 2 diabetes mellitus without complications: Secondary | ICD-10-CM

## 2010-11-13 MED ORDER — CITALOPRAM HYDROBROMIDE 20 MG PO TABS: 20.0000 mg | ORAL_TABLET | Freq: Every day | ORAL | Status: DC

## 2010-11-13 NOTE — Patient Instructions (Signed)
Complete your blood work on the first floor. Citalopram- 1/2 tablet once daily for 1 week, then increase to full tablet once daily on week two. Follow up in 1 month, sooner if problems or concerns.

## 2010-11-13 NOTE — Assessment & Plan Note (Addendum)
This is a new problem for the patient. He is also currently abusing alcohol due to his depression. We discussed the possibility of seeing a therapist- though he does not feel that he has the time currently.  I have advised that he start citalopram and follow up in one month. He was advised on side effects of the medication and to start 1/2 tablet once daily for the first week then increase to a full tablet once daily.  He is advised today to discontinue use of alcohol. 25 minutes spent with the patient today.  >50% of this time was spent counseling pt on his depression and alcohol abuse.

## 2010-11-13 NOTE — Assessment & Plan Note (Signed)
This is deteriorating. We'll increase his Benicar HCT from 20/12.5 once daily to 40/25 once daily. He is instructed to take 2 tablets of his existing prescription until that runs out.

## 2010-11-13 NOTE — Assessment & Plan Note (Signed)
This appears well controlled, we'll check A1c. He is up-to-date on his eye exam.

## 2010-11-13 NOTE — Progress Notes (Signed)
Subjective:    Patient ID: Willie [name], male    DOB: 1975/05/09, 35 y.o.   MRN: [id number]  HPI Willie [name] is a 35 year old male who presents today for routine followup. He has several concerns today:  1. DM2- taking metformin 1000mg  AM, and 500mg  PM.  Sugar 120's fasting unless he has had PM snack (then it runs about 150).  Denies any episodes or hypoglycemia. Last eye exam was 1 week ago at [facility].  Told that his eyes looked health.   2. HTN- Takes zebeta daily at lunch time- reports that he has been taking this regularly. Denies chest pain, SOB, HA, swelling in his feet.    3. Hyperlipidemia- on lipitor.  Diet is fair.  Denies myalgia.    4. Fraternal twin brother with lung cancer- now metastatic.   Sleeps ok "when I take my pills (alprazolam)." Some trouble motivating to get up in the AM.  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  Appetite is "fine."  Tearful a lot for 3-4 weeks.   He has never been treated for depression in the past.  Concentration is "fine."  Denies panic attacks.  Increased alcohol consumption 4-8 beers a night, "until I fall asleep" for the last 10 days.  The patient denies suicide ideation.    Review of Systems See HPI  Past Medical History  Diagnosis Date  . Alcoholic pancreatitis     recurrent  . Diabetes mellitus, type II     New Onset 03/2010  . Hypertension   . 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 S/P lumbar microdiscectomy 11/2001    Dr Danielle Dess    History   Social History  . Marital Status: Married    Spouse Name: N/A    Number of Children: 2  . 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 Smoking status: Never Smoker   . Smokeless tobacco: Never Used  . Alcohol Use: 2.4 oz/week    4 Cans of beer per week  . Drug Use: No  . Sexually Active: Not on file   Other Topics Concern  . 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 Lumbar microdiscectomy     Dr Danielle Dess    Family History  Problem Relation Age of Onset  . Diabetes Mother   . Lung cancer Brother     twin brother  . Colon cancer Neg Hx   . Pancreatic cancer Paternal Aunt     No Known Allergies  Current Outpatient Prescriptions on File Prior to Visit  Medication Sig Dispense Refill  . ALPRAZolam (XANAX) 0.5 MG tablet 1/2 tablet by mouth twice a day and 1 tablet by mouth at bedtime as needed  60 tablet  0  . atorvastatin (LIPITOR) 10 MG tablet Take 10 mg by mouth daily.        . bisoprolol (ZEBETA) 10 MG tablet Take 1 tablet (10 mg total) by mouth at bedtime.  90 tablet  1  . esomeprazole (NEXIUM) 40 MG capsule Take 40 mg by mouth at bedtime.        Marland Kitchen  glucose blood (ONE TOUCH TEST STRIPS) test strip 1 each. Use to check blood sugar once day       . metFORMIN (GLUCOPHAGE) 500 MG tablet TAKE 1 & 1/2 TABLET BY MOUTH TWICE DAILY  90 tablet  3  . multivitamin (ONE-A-DAY MEN'S) TABS Take 1 tablet by mouth daily.        . Omega-3 Fatty Acids (FISH OIL) 1000 MG CAPS Take 1,000 mg by mouth 2 (two) times daily.        . ONE TOUCH LANCETS MISC by Does not apply route. Use to check blood sugar once a day        Current Facility-Administered Medications on File Prior to Visit  Medication Dose Route Frequency Provider Last Rate Last Dose  . 0.9 %  sodium chloride infusion  500 mL Intravenous Continuous Rob Bunting, MD        BP 150/100  Pulse 66  Temp(Src) 97.9 F (36.6 C) (Oral)  Resp 16  Ht 6' (1.829 m)  Wt 261 lb 0.6 oz (118.407 kg)  BMI 35.40 kg/m2       Objective:   Physical Exam Gen.: Overweight, pleasant, African American male, in no acute distress Cardiovascular: S1, S2, regular rate and  rhythm Respiratory: Breath sounds are clear to auscultation bilaterally without wheezes rales or rhonchi Abdomen: Soft, nontender, nondistended, positive bowel sounds are noted Extremities: No peripheral edema is noted, see diabetic foot exam Psychiatric: Patient is calm and pleasant, awake and alert and oriented x4. He was noted to become mildly tearful upon discussion of his brother's health.       Assessment & Plan:

## 2010-11-14 LAB — BASIC METABOLIC PANEL
BUN: 12 mg/dL (ref 6–23)
CO2: 28 mEq/L (ref 19–32)
Chloride: 98 mEq/L (ref 96–112)
Creat: 0.95 mg/dL (ref 0.50–1.35)

## 2010-11-14 LAB — HEPATIC FUNCTION PANEL
Alkaline Phosphatase: 72 U/L (ref 39–117)
Indirect Bilirubin: 0.3 mg/dL (ref 0.0–0.9)
Total Bilirubin: 0.4 mg/dL (ref 0.3–1.2)

## 2010-11-14 LAB — LIPID PANEL
LDL Cholesterol: 89 mg/dL (ref 0–99)
Triglycerides: 119 mg/dL (ref <150)

## 2010-11-19 ENCOUNTER — Telehealth: Payer: Self-pay | Admitting: Family

## 2010-11-19 ENCOUNTER — Encounter: Payer: Self-pay | Admitting: Family

## 2010-11-19 NOTE — Telephone Encounter (Signed)
Opened in error

## 2010-11-26 ENCOUNTER — Other Ambulatory Visit: Payer: Self-pay | Admitting: Internal Medicine

## 2010-11-26 NOTE — Telephone Encounter (Signed)
Alprazolam refill left on pharmacy voicemail for #60 x no refills.

## 2010-12-12 ENCOUNTER — Encounter: Payer: Self-pay | Admitting: Family

## 2010-12-12 ENCOUNTER — Ambulatory Visit (INDEPENDENT_AMBULATORY_CARE_PROVIDER_SITE_OTHER): Payer: 59 | Admitting: Family

## 2010-12-12 VITALS — BP 128/86 | HR 83 | Temp 98.0°F | Ht 70.5 in | Wt 258.1 lb

## 2010-12-12 DIAGNOSIS — I10 Essential (primary) hypertension: Secondary | ICD-10-CM

## 2010-12-12 DIAGNOSIS — F329 Major depressive disorder, single episode, unspecified: Secondary | ICD-10-CM

## 2010-12-12 LAB — BASIC METABOLIC PANEL
CO2: 25 mEq/L (ref 19–32)
Calcium: 10.2 mg/dL (ref 8.4–10.5)
Creat: 1.01 mg/dL (ref 0.50–1.35)
Sodium: 134 mEq/L — ABNORMAL LOW (ref 135–145)

## 2010-12-12 MED ORDER — OLMESARTAN MEDOXOMIL-HCTZ 40-25 MG PO TABS: 1.0000 | ORAL_TABLET | Freq: Every day | ORAL | Status: DC

## 2010-12-12 MED ORDER — CITALOPRAM HYDROBROMIDE 20 MG PO TABS: 20.0000 mg | ORAL_TABLET | Freq: Every day | ORAL | Status: DC

## 2010-12-12 MED ORDER — METFORMIN HCL 500 MG PO TABS: ORAL_TABLET | ORAL | Status: DC

## 2010-12-12 NOTE — Assessment & Plan Note (Signed)
Improved, continue current dose of benicar.

## 2010-12-12 NOTE — Patient Instructions (Signed)
Please follow up in 3 months, sooner if problems or concerns. 

## 2010-12-12 NOTE — Progress Notes (Signed)
Subjective:    Patient ID: Willie [name], male    DOB: 1975-05-23, 35 y.o.   MRN: [id number]  HPI  Willie [name] is a 35 yr old male who presents today for folow up of his depression.  Last visit he was placed on citalopram for his depression.  He reports that he is sleeping and eating well.  No longer tearful.  Finds it easier to get out of bed in the morning.  Has cut back on his alcohol intake to 2-3 drinks a night which is a significant improvement for him.  He notes some associated sexual dysfunction, but not severe enough to consider discontinuation of medication.  HTN-  Last visit his benicar was increased.  Reports that he is tolerating the dose without any difficulty.   Review of Systems See HPI  Past Medical History  Diagnosis Date  . Alcoholic pancreatitis     recurrent  . Diabetes mellitus, type II     New Onset 03/2010  . Hypertension   . 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 S/P lumbar microdiscectomy 11/2001    Dr Danielle Dess    History   Social History  . Marital Status: Married    Spouse Name: N/A    Number of Children: 2  . 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 Smoking status: Never Smoker   . Smokeless tobacco: Never Used  . Alcohol Use: 2.4 oz/week    4 Cans of beer per week  . Drug Use: No  . Sexually Active: Not on file   Other Topics Concern  . 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 Lumbar microdiscectomy     Dr Danielle Dess    Family History  Problem Relation Age of Onset  . Diabetes Mother   . Lung cancer Brother     twin brother  . Colon cancer Neg Hx   . Pancreatic cancer Paternal Aunt      No Known Allergies  Current Outpatient Prescriptions on File Prior to Visit  Medication Sig Dispense Refill  . ALPRAZolam (XANAX) 0.5 MG tablet TAKE 1/2 TABLET BY MOUTH TWICE DAILY AND 1 TABLET AT BEDTIME AS DIRECTED  60 tablet  0  . atorvastatin (LIPITOR) 10 MG tablet Take 10 mg by mouth daily.        . bisoprolol (ZEBETA) 10 MG tablet Take 1 tablet (10 mg total) by mouth at bedtime.  90 tablet  1  . glucose blood (ONE TOUCH TEST STRIPS) test strip 1 each. Use to check blood sugar once day       . multivitamin (ONE-A-DAY MEN'S) TABS Take 1 tablet by mouth daily.        Marland Kitchen NEXIUM 40 MG capsule TAKE 1 CAPSULE BY MOUTH AT BEDTIME  30 capsule  1  . Omega-3 Fatty Acids (FISH OIL) 1000 MG CAPS Take 1,000 mg by mouth 2 (two) times daily.        . ONE TOUCH LANCETS MISC by Does not apply route. Use to check blood sugar once a day  Current Facility-Administered Medications on File Prior to Visit  Medication Dose Route Frequency Provider Last Rate Last Dose  . 0.9 %  sodium chloride infusion  500 mL Intravenous Continuous Rob Bunting, MD        BP 128/86  Pulse 83  Temp(Src) 98 F (36.7 C) (Oral)  Ht 5' 10.5" (1.791 m)  Wt 258 lb 1.9 oz (117.082 kg)  BMI 36.51 kg/m2  SpO2 96%       Objective:   Physical Exam  Constitutional: He appears well-developed and well-nourished.  Cardiovascular: Normal rate and regular rhythm.   Pulmonary/Chest: Effort normal and breath sounds normal.  Musculoskeletal: He exhibits no edema.  Psychiatric: He has a normal mood and affect. His behavior is normal. Judgment and thought content normal.          Assessment & Plan:  benicar hct samples provided lot 04/14 lot [id number] #28

## 2010-12-12 NOTE — Assessment & Plan Note (Signed)
Improved, continue current dose of citalopram.  15 minutes spent with pt today.  >50% of this time was spent counseling pt on his depression.

## 2010-12-26 ENCOUNTER — Telehealth: Payer: Self-pay | Admitting: Family

## 2010-12-26 ENCOUNTER — Ambulatory Visit (HOSPITAL_BASED_OUTPATIENT_CLINIC_OR_DEPARTMENT_OTHER)
Admission: RE | Admit: 2010-12-26 | Discharge: 2010-12-26 | Disposition: A | Discharge status: Home / Self Care | Payer: 59 | Source: Ambulatory Visit | Attending: Family | Admitting: Family

## 2010-12-26 ENCOUNTER — Ambulatory Visit: Payer: 59 | Admitting: Family

## 2010-12-26 ENCOUNTER — Ambulatory Visit (INDEPENDENT_AMBULATORY_CARE_PROVIDER_SITE_OTHER): Payer: 59 | Admitting: Family

## 2010-12-26 ENCOUNTER — Encounter: Payer: Self-pay | Admitting: Family

## 2010-12-26 VITALS — BP 152/90 | HR 66 | Temp 98.1°F | Resp 16 | Ht 70.5 in | Wt 263.0 lb

## 2010-12-26 DIAGNOSIS — M25579 Pain in unspecified ankle and joints of unspecified foot: Secondary | ICD-10-CM | POA: Insufficient documentation

## 2010-12-26 DIAGNOSIS — W19XXXA Unspecified fall, initial encounter: Secondary | ICD-10-CM

## 2010-12-26 DIAGNOSIS — M25571 Pain in right ankle and joints of right foot: Secondary | ICD-10-CM

## 2010-12-26 DIAGNOSIS — S93409A Sprain of unspecified ligament of unspecified ankle, initial encounter: Secondary | ICD-10-CM

## 2010-12-26 NOTE — Progress Notes (Signed)
Subjective:    Patient ID: Willie [name], male    DOB: 1975-11-09, 35 y.o.   MRN: [id number]  HPI  Mr.  Willie [name] is a 35 yr old male who presents today following an accidental slip and fall last night at the grocery store. He now has right ankle and mid low back pain.  He reports difficulty bearing weight on the right foot due to pain.  Low back pain is localized to the mid low back and is not associated with any radiculopathy or lower extremity weakness. He does have a history of an L5-S1 microdiscectomy in 2003. Took Aleve, old rx of vicodin with minimal improvement.       Review of Systems Past Medical History  Diagnosis Date  . Alcoholic pancreatitis     recurrent  . Diabetes mellitus, type II     New Onset 03/2010  . Hypertension   . 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 S/P lumbar microdiscectomy 11/2001    Dr Danielle Dess    History   Social History  . Marital Status: Married    Spouse Name: N/A    Number of Children: 2  . 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 Smoking status: Never Smoker   . Smokeless tobacco: Never Used  . Alcohol Use: 2.4 oz/week    4 Cans of beer per week  . Drug Use: No  . Sexually Active: Not on file   Other Topics Concern  . 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 Lumbar microdiscectomy     Dr Danielle Dess    Family History  Problem Relation Age of Onset  . Diabetes Mother   . Lung cancer Brother     twin brother  . Colon cancer Neg Hx   . Pancreatic cancer Paternal Aunt     No Known Allergies  Current Outpatient Prescriptions on File Prior to Visit  Medication Sig Dispense Refill  .  ALPRAZolam (XANAX) 0.5 MG tablet TAKE 1/2 TABLET BY MOUTH TWICE DAILY AND 1 TABLET AT BEDTIME AS DIRECTED  60 tablet  0  . atorvastatin (LIPITOR) 10 MG tablet Take 10 mg by mouth daily.        . bisoprolol (ZEBETA) 10 MG tablet Take 1 tablet (10 mg total) by mouth at bedtime.  90 tablet  1  . citalopram (CELEXA) 20 MG tablet Take 1 tablet (20 mg total) by mouth daily.  30 tablet  3  . glucose blood (ONE TOUCH TEST STRIPS) test strip 1 each. Use to check blood sugar once day       . metFORMIN (GLUCOPHAGE) 500 MG tablet Take two tablets in the morning and one tablet in the evening  90 tablet  3  . multivitamin (ONE-A-DAY MEN'S) TABS Take 1 tablet by mouth daily.        Marland Kitchen NEXIUM 40 MG capsule TAKE 1 CAPSULE BY MOUTH AT BEDTIME  30 capsule  1  . olmesartan-hydrochlorothiazide (BENICAR HCT) 40-25 MG per tablet Take 1 tablet by mouth daily.  60 tablet  2  . Omega-3 Fatty Acids (FISH OIL)  1000 MG CAPS Take 1,000 mg by mouth 2 (two) times daily.        . ONE TOUCH LANCETS MISC by Does not apply route. Use to check blood sugar once a day        Current Facility-Administered Medications on File Prior to Visit  Medication Dose Route Frequency Clint Biello Last Rate Last Dose  . 0.9 %  sodium chloride infusion  500 mL Intravenous Continuous Rob Bunting, MD        BP 152/90  Pulse 66  Temp(Src) 98.1 F (36.7 C) (Oral)  Resp 16  Ht 5' 10.5" (1.791 m)  Wt 263 lb (119.296 kg)  BMI 37.20 kg/m2       Objective:   Physical Exam  Constitutional: He appears well-developed and well-nourished.  Cardiovascular: Normal rate and regular rhythm.   No murmur heard. Pulmonary/Chest: Effort normal and breath sounds normal.  Musculoskeletal:       Full ROM of right ankle- pain is worst with internal rotation.  Mild lateral ankle swelling/tenderness to palpation.    + tenderness to palpation of right lumbar area.           Assessment & Plan:

## 2010-12-26 NOTE — Patient Instructions (Signed)
Aleve 220mg  twice daily as needed for pain.   Complete your x-ray on the first floor. We will contact you with the results.

## 2010-12-26 NOTE — Telephone Encounter (Signed)
Reviewed x-ray results. Neg for fracture.  Discussed with pt. Recommended ACE wrap.

## 2010-12-28 DIAGNOSIS — S93409A Sprain of unspecified ligament of unspecified ankle, initial encounter: Secondary | ICD-10-CM | POA: Insufficient documentation

## 2010-12-28 NOTE — Assessment & Plan Note (Signed)
X-ray of right ankle is reviewed and neg for fracture.  Recommended ACE wrap and short course of NSAIDS as needed for pain.

## 2011-01-16 ENCOUNTER — Telehealth: Payer: Self-pay | Admitting: Internal Medicine

## 2011-01-16 NOTE — Telephone Encounter (Signed)
Refills- alprazolam 0.5mg  tablet. Take 1/2 tablet by mouth twice daily and 1 tablet at bedtime as directed. Qty 60. Last fill 7.30.12

## 2011-01-16 NOTE — Telephone Encounter (Signed)
Is it okay to refill this medication?

## 2011-01-17 MED ORDER — ALPRAZOLAM 0.5 MG PO TABS: ORAL_TABLET | ORAL | Status: DC

## 2011-01-17 NOTE — Telephone Encounter (Signed)
OK to send #60 with no refills. 

## 2011-01-17 NOTE — Telephone Encounter (Signed)
Rx refill called to pharmacy doctor line at  [phone].

## 2011-01-25 ENCOUNTER — Other Ambulatory Visit: Payer: Self-pay | Admitting: Internal Medicine

## 2011-01-25 ENCOUNTER — Other Ambulatory Visit: Payer: Self-pay | Admitting: Family

## 2011-01-29 ENCOUNTER — Telehealth: Payer: Self-pay | Admitting: Internal Medicine

## 2011-01-29 NOTE — Telephone Encounter (Signed)
Refill- atorvastatin 10mg  tab. Take one tablet by mouth daily. Qty 90. Last fill 6.22.12

## 2011-01-30 MED ORDER — ATORVASTATIN CALCIUM 10 MG PO TABS: 10.0000 mg | ORAL_TABLET | Freq: Every day | ORAL | Status: DC

## 2011-01-30 NOTE — Telephone Encounter (Signed)
Refill sent for 90 day supply x no refills. 

## 2011-01-31 ENCOUNTER — Ambulatory Visit (INDEPENDENT_AMBULATORY_CARE_PROVIDER_SITE_OTHER): Payer: 59 | Admitting: Internal Medicine

## 2011-01-31 ENCOUNTER — Emergency Department (HOSPITAL_BASED_OUTPATIENT_CLINIC_OR_DEPARTMENT_OTHER)
Admission: EM | Admit: 2011-01-31 | Discharge: 2011-02-01 | Disposition: A | Discharge status: Home / Self Care | Payer: 59 | Attending: Emergency Medicine | Admitting: Emergency Medicine

## 2011-01-31 ENCOUNTER — Emergency Department (INDEPENDENT_AMBULATORY_CARE_PROVIDER_SITE_OTHER): Payer: 59

## 2011-01-31 ENCOUNTER — Encounter (HOSPITAL_BASED_OUTPATIENT_CLINIC_OR_DEPARTMENT_OTHER): Payer: Self-pay | Admitting: *Deleted

## 2011-01-31 ENCOUNTER — Encounter: Payer: Self-pay | Admitting: Internal Medicine

## 2011-01-31 VITALS — BP 140/100 | HR 79 | Temp 98.0°F | Resp 18 | Ht 70.5 in | Wt 262.0 lb

## 2011-01-31 DIAGNOSIS — K852 Alcohol induced acute pancreatitis without necrosis or infection: Secondary | ICD-10-CM

## 2011-01-31 DIAGNOSIS — R1013 Epigastric pain: Secondary | ICD-10-CM | POA: Insufficient documentation

## 2011-01-31 DIAGNOSIS — R109 Unspecified abdominal pain: Secondary | ICD-10-CM

## 2011-01-31 DIAGNOSIS — F1010 Alcohol abuse, uncomplicated: Secondary | ICD-10-CM | POA: Insufficient documentation

## 2011-01-31 DIAGNOSIS — I10 Essential (primary) hypertension: Secondary | ICD-10-CM

## 2011-01-31 DIAGNOSIS — K429 Umbilical hernia without obstruction or gangrene: Secondary | ICD-10-CM

## 2011-01-31 DIAGNOSIS — R112 Nausea with vomiting, unspecified: Secondary | ICD-10-CM | POA: Insufficient documentation

## 2011-01-31 DIAGNOSIS — K7689 Other specified diseases of liver: Secondary | ICD-10-CM | POA: Insufficient documentation

## 2011-01-31 DIAGNOSIS — K859 Acute pancreatitis without necrosis or infection, unspecified: Secondary | ICD-10-CM

## 2011-01-31 LAB — DIFFERENTIAL
Basophils Absolute: 0 10*3/uL (ref 0.0–0.1)
Basophils Relative: 0 % (ref 0–1)
Monocytes Relative: 6 % (ref 3–12)
Neutro Abs: 10.1 10*3/uL — ABNORMAL HIGH (ref 1.7–7.7)
Neutrophils Relative %: 86 % — ABNORMAL HIGH (ref 43–77)

## 2011-01-31 LAB — BASIC METABOLIC PANEL
CO2: 26 mEq/L (ref 19–32)
Calcium: 9.5 mg/dL (ref 8.4–10.5)
Sodium: 137 mEq/L (ref 135–145)

## 2011-01-31 LAB — URINALYSIS, ROUTINE W REFLEX MICROSCOPIC
Bilirubin Urine: NEGATIVE
Glucose, UA: 100 mg/dL — AB
Ketones, ur: NEGATIVE mg/dL
pH: 6.5 (ref 5.0–8.0)

## 2011-01-31 LAB — CBC WITH DIFFERENTIAL/PLATELET
Basophils Absolute: 0 10*3/uL (ref 0.0–0.1)
Eosinophils Absolute: 0.2 10*3/uL (ref 0.0–0.7)
Lymphocytes Relative: 26 % (ref 12–46)
Lymphs Abs: 3.4 10*3/uL (ref 0.7–4.0)
MCH: 30.7 pg (ref 26.0–34.0)
Neutrophils Relative %: 67 % (ref 43–77)
Platelets: 263 10*3/uL (ref 150–400)
RBC: 4.99 MIL/uL (ref 4.22–5.81)
WBC: 13.2 10*3/uL — ABNORMAL HIGH (ref 4.0–10.5)

## 2011-01-31 LAB — CBC
Hemoglobin: 16.1 g/dL (ref 13.0–17.0)
MCHC: 36.4 g/dL — ABNORMAL HIGH (ref 30.0–36.0)
RDW: 13.4 % (ref 11.5–15.5)

## 2011-01-31 LAB — HEPATIC FUNCTION PANEL
Alkaline Phosphatase: 61 U/L (ref 39–117)
Bilirubin, Direct: 0.2 mg/dL (ref 0.0–0.3)
Indirect Bilirubin: 0.7 mg/dL (ref 0.0–0.9)
Total Protein: 8 g/dL (ref 6.0–8.3)

## 2011-01-31 LAB — COMPREHENSIVE METABOLIC PANEL
ALT: 110 U/L — ABNORMAL HIGH (ref 0–53)
AST: 73 U/L — ABNORMAL HIGH (ref 0–37)
Albumin: 4.5 g/dL (ref 3.5–5.2)
Alkaline Phosphatase: 72 U/L (ref 39–117)
Chloride: 95 mEq/L — ABNORMAL LOW (ref 96–112)
Potassium: 4 mEq/L (ref 3.5–5.1)
Total Bilirubin: 0.8 mg/dL (ref 0.3–1.2)

## 2011-01-31 MED ORDER — AMLODIPINE BESYLATE 5 MG PO TABS: 5.0000 mg | ORAL_TABLET | Freq: Every day | ORAL | Status: DC

## 2011-01-31 MED ORDER — HYDROMORPHONE HCL 2 MG/ML IJ SOLN
INTRAMUSCULAR | Status: AC
  Administered 2011-01-31: 2 mg
  Filled 2011-01-31: qty 1

## 2011-01-31 MED ORDER — SODIUM CHLORIDE 0.9 % IV SOLN
999.0000 mL | Freq: Once | INTRAVENOUS | Status: AC
  Administered 2011-01-31: 1000 mL via INTRAVENOUS

## 2011-01-31 MED ORDER — HYDROMORPHONE HCL 2 MG PO TABS: 2.0000 mg | ORAL_TABLET | ORAL | Status: DC | PRN

## 2011-01-31 MED ORDER — ONDANSETRON HCL 8 MG PO TABS: 8.0000 mg | ORAL_TABLET | Freq: Four times a day (QID) | ORAL | Status: AC | PRN

## 2011-01-31 MED ORDER — HYDROMORPHONE HCL 1 MG/ML IJ SOLN
2.0000 mg | Freq: Once | INTRAMUSCULAR | Status: DC
  Filled 2011-01-31: qty 2

## 2011-01-31 MED ORDER — IOHEXOL 300 MG/ML  SOLN
100.0000 mL | Freq: Once | INTRAMUSCULAR | Status: AC | PRN
  Administered 2011-01-31: 100 mL via INTRAVENOUS

## 2011-01-31 MED ORDER — OXYCODONE HCL 5 MG PO TABS: 5.0000 mg | ORAL_TABLET | ORAL | Status: DC | PRN

## 2011-01-31 MED ORDER — ONDANSETRON HCL 4 MG/2ML IJ SOLN
4.0000 mg | Freq: Once | INTRAMUSCULAR | Status: AC
  Administered 2011-01-31: 4 mg via INTRAVENOUS
  Filled 2011-01-31: qty 2

## 2011-01-31 MED ORDER — HYDROMORPHONE HCL 1 MG/ML IJ SOLN
1.0000 mg | Freq: Once | INTRAMUSCULAR | Status: AC
  Administered 2011-01-31: 1 mg via INTRAVENOUS
  Filled 2011-01-31: qty 1

## 2011-01-31 NOTE — ED Notes (Signed)
Abdominal pain across top of abdomen woke him at 0400 this am. Pt denies nausea or vomiting . States he saw dr Yetta Flock this morning and dr called him back this afternoon telling him his labs were back and he had pancreatitis to go to the hospital if he was still hurting.

## 2011-01-31 NOTE — ED Provider Notes (Signed)
History     CSN: [id number] Arrival date & time: 01/31/2011  6:16 PM  Chief Complaint  Patient presents with  . Pancreatitis    (Consider location/radiation/quality/duration/timing/severity/associated sxs/prior treatment) HPI Comments: The patient is a 35 year old male with a history of prior pancreatitis. He was seen earlier today by his doctor, Dr. Sherre Scarlet of the [facility] for the same complaint of epigastric abdominal pain, burning, and moderate to severe in intensity. There is no radiation of the pain, and he has had nausea but no vomiting. He reports that recently his twin brother died and he had resumed drinking alcohol about 2 weeks ago. He reports onset of his pain was this morning. He denies any shortness of breath or chest pain. Patient is a 35 y.o. male presenting with abdominal pain.  Abdominal Pain The primary symptoms of the illness include abdominal pain, nausea and vomiting. The primary symptoms of the illness do not include fever, fatigue, shortness of breath, diarrhea, hematemesis, hematochezia or dysuria. The current episode started yesterday. The onset of the illness was gradual. The problem has been gradually improving.  The abdominal pain began yesterday. The pain came on gradually. The abdominal pain has been gradually improving since its onset. The abdominal pain is located in the epigastric region. The abdominal pain does not radiate. The severity of the abdominal pain is 7/10. The abdominal pain is relieved by nothing. The abdominal pain is exacerbated by alcohol use and eating.  Nausea began yesterday. The nausea is associated with alcohol use and eating. The nausea is exacerbated by food.  The illness is associated with alcohol use and eating. The patient has not had a change in bowel habit. Additional symptoms associated with the illness include anorexia. Symptoms associated with the illness do not include chills, diaphoresis, heartburn, constipation, urgency,  hematuria, frequency or back pain. Significant associated medical issues include substance abuse.    Past Medical History  Diagnosis Date  . Alcoholic pancreatitis     recurrent  . Diabetes mellitus, type II     New Onset 03/2010  . Hypertension   . 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 S/P lumbar microdiscectomy 11/2001    Dr Danielle Dess    Past Surgical History  Procedure Date  . Lumbar microdiscectomy     Dr Danielle Dess    Family History  Problem Relation Age of Onset  . Diabetes Mother   . Lung cancer Brother     twin brother  . Colon cancer Neg Hx   . Pancreatic cancer Paternal Aunt     History  Substance Use Topics  . Smoking status: Never Smoker   . Smokeless tobacco: Never Used  . Alcohol Use: 2.4 oz/week    4 Cans of beer per week      Review of Systems  Constitutional: Negative for fever, chills, diaphoresis and fatigue.  HENT: Negative.   Eyes: Negative.   Respiratory: Negative for chest tightness, shortness of breath and wheezing.   Cardiovascular: Negative for chest pain and leg swelling.  Gastrointestinal: Positive for nausea, vomiting, abdominal pain and anorexia. Negative for heartburn, diarrhea, constipation, blood in stool, hematochezia, abdominal distention, anal bleeding, rectal pain and hematemesis.  Genitourinary: Negative for dysuria, urgency, frequency, hematuria, flank pain, decreased urine volume and difficulty urinating.  Musculoskeletal: Negative for myalgias and back pain.  Skin: Negative.   Neurological: Negative for dizziness, light-headedness, numbness and headaches.  Psychiatric/Behavioral: Negative.  Allergies  Review of patient's allergies indicates no known allergies.  Home Medications   Current Outpatient Rx  Name Route Sig Dispense Refill  . ALPRAZOLAM 0.5 MG PO TABS  0.5 mg 2 (two) times daily as needed. For anxiety     . AMLODIPINE BESYLATE 5 MG PO TABS Oral Take 1 tablet  (5 mg total) by mouth daily. 30 tablet 0  . BISOPROLOL FUMARATE 10 MG PO TABS Oral Take 1 tablet (10 mg total) by mouth at bedtime. 90 tablet 1  . CITALOPRAM HYDROBROMIDE 20 MG PO TABS Oral Take 1 tablet (20 mg total) by mouth daily. 30 tablet 3  . METFORMIN HCL 500 MG PO TABS Oral Take 500 mg by mouth at bedtime.      Marland Kitchen METFORMIN HCL 500 MG PO TABS Oral Take 1,000 mg by mouth every morning. Take two tablets in the morning and one tablet in the evening     . ONE-A-DAY MENS PO TABS Oral Take 1 tablet by mouth daily.      Marland Kitchen NEXIUM 40 MG PO CPDR  TAKE 1 CAPSULE BY MOUTH AT BEDTIME 30 capsule 2  . OLMESARTAN MEDOXOMIL-HCTZ 40-25 MG PO TABS Oral Take 1 tablet by mouth daily. 60 tablet 2  . FISH OIL 1000 MG PO CAPS Oral Take 2,000 mg by mouth 2 (two) times daily.     . OXYCODONE HCL 5 MG PO TABS Oral Take 1 tablet (5 mg total) by mouth every 4 (four) hours as needed for pain. 28 tablet 0  . ATORVASTATIN CALCIUM 10 MG PO TABS Oral Take 1 tablet (10 mg total) by mouth daily. 90 tablet 0  . GLUCOSE BLOOD VI STRP  1 each. Use to check blood sugar once day     . ONETOUCH LANCETS MISC Does not apply by Does not apply route. Use to check blood sugar once a day       BP 143/96  Pulse 84  Temp 97.6 F (36.4 C)  Resp 18  SpO2 99%  Physical Exam  Nursing note and vitals reviewed. Constitutional: He is oriented to person, place, and time. He appears well-developed and well-nourished. He is active.  Non-toxic appearance. He does not have a sickly appearance. He does not appear ill. He appears distressed.  HENT:  Head: Normocephalic and atraumatic.  Right Ear: Hearing, tympanic membrane, external ear and ear canal normal.  Left Ear: Hearing, tympanic membrane, external ear and ear canal normal.  Nose: Nose normal. No mucosal edema.  Mouth/Throat: Uvula is midline and oropharynx is clear and moist. Mucous membranes are dry. No oral lesions. No uvula swelling. No oropharyngeal exudate, posterior  oropharyngeal edema, posterior oropharyngeal erythema or tonsillar abscesses.  Eyes: Conjunctivae and EOM are normal. Pupils are equal, round, and reactive to light. Right eye exhibits no chemosis, no discharge and no exudate. Left eye exhibits no chemosis, no discharge and no exudate. Right conjunctiva is not injected. Left conjunctiva is not injected. No scleral icterus.  Neck: Normal range of motion, full passive range of motion without pain and phonation normal. Neck supple. No JVD present. No rigidity. No Brudzinski's sign noted.  Cardiovascular: Normal rate, regular rhythm, normal heart sounds, intact distal pulses and normal pulses.   No extrasystoles are present. Exam reveals no gallop and no friction rub.   No murmur heard. Pulmonary/Chest: Effort normal and breath sounds normal. No accessory muscle usage. Not tachypneic. No respiratory distress. He has no decreased breath sounds. He has no wheezes. He has  no rhonchi. He has no rales. He exhibits no tenderness, no crepitus and no retraction.  Abdominal: Soft. Normal appearance and bowel sounds are normal. He exhibits no shifting dullness, no distension, no pulsatile liver, no fluid wave, no abdominal bruit, no ascites, no pulsatile midline mass and no mass. There is no hepatosplenomegaly. There is tenderness in the epigastric area. There is no rigidity, no rebound and no guarding. No hernia.  Musculoskeletal: Normal range of motion.  Neurological: He is alert and oriented to person, place, and time. He has normal strength and normal reflexes. He is not disoriented. No cranial nerve deficit. Coordination normal. GCS eye subscore is 4. GCS verbal subscore is 5. GCS motor subscore is 6.  Skin: Skin is warm, dry and intact. No bruising, no ecchymosis, no lesion and no rash noted. He is not diaphoretic. No erythema. No pallor.  Psychiatric: He has a normal mood and affect. His speech is normal and behavior is normal. Judgment and thought content  normal. Cognition and memory are normal.    ED Course  Procedures (including critical care time) 9:34 PM The patient is a present comfortable and reporting no significant abdominal pain. I will obtain an abdominal CT scan to evaluate for pathology behind his pancreatitis. Labs Reviewed  CBC - Abnormal; Notable for the following:    WBC 11.7 (*)    MCHC 36.4 (*)    All other components within normal limits  DIFFERENTIAL - Abnormal; Notable for the following:    Neutrophils Relative 86 (*)    Neutro Abs 10.1 (*)    Lymphocytes Relative 8 (*)    All other components within normal limits  COMPREHENSIVE METABOLIC PANEL - Abnormal; Notable for the following:    Chloride 95 (*)    Glucose, Bld 174 (*)    AST 73 (*)    ALT 110 (*)    All other components within normal limits  LIPASE, BLOOD  URINALYSIS, ROUTINE W REFLEX MICROSCOPIC  Ct Abdomen Pelvis W Contrast  01/31/2011  *RADIOLOGY REPORT*  Clinical Data: Abdominal pain, elevated lipase, and elevated liver function tests.  Clinically pancreatitis.  CT ABDOMEN AND PELVIS WITH CONTRAST  Technique:  Multidetector CT imaging of the abdomen and pelvis was performed following the standard protocol during bolus administration of intravenous contrast.  Contrast: OMNIPAQUE IOHEXOL 300 MG/ML IV SOLN  Comparison: 03/22/2009  Findings: There is diffuse inflammatory infiltration and fluid around the pancreas and extending throughout the upper abdominal fat planes as well as along the pericolic gutters particularly on the right.  Changes are consistent with acute pancreatitis.  The pancreatic parenchyma demonstrates homogeneous enhancement without evidence of pancreatic necrosis.  No focal loculated fluid collections are present to suggest abscess or pseudocyst.  Diffuse low attenuation change throughout the liver consistent with fatty infiltration.  The gallbladder is not abnormally distended. Spleen size is normal.  Small accessory spleen.  Adrenal  glands and kidneys are unremarkable.  No abdominal aortic aneurysm.  No retroperitoneal lymphadenopathy.  The stomach and small bowel are are not distended.  No free air in the abdomen.  Small umbilical hernia containing fat.  Pelvis:  The appendix is normal.  Scattered diverticula in the sigmoid colon without inflammatory change.  Fat in the left inguinal canal.  No bladder wall thickening.  Minimal fluid in the pelvis, likely extending from the abdominal inflammatory process. Normal alignment of the lumbar vertebra.  Benign-appearing sclerosis in the right acetabulum.  IMPRESSION: Diffuse inflammatory infiltration around the pancreas and upper  abdomen, extending throughout the abdominal fat planes, consistent with acute pancreatitis.  No evidence of pancreatic necrosis, abscess, or pseudocyst.  Diffuse fatty infiltration of the liver. Small fat containing umbilical hernia.  Original Report Authenticated By: Marlon Pel, M.D.   No results found.   No diagnosis found.    MDM  Gastritis, peptic ulcer disease, biliary colic, cholelithiasis, cholecystitis, cholangitis, hepatitis, renal colic, urinary tract infection, colitis, constipation, gastroenteritis all considered among other etiologies in the patient's differential diagnosis.  The patient appears to have uncomplicated acute pancreatitis. At this time his pain is controlled and he is taking oral fluids without difficulty. He wishes to be discharged home. I will do so with prescribed pain medications and have him followup with his primary care physician.        Felisa Bonier, MD 02/01/11 2113

## 2011-02-03 DIAGNOSIS — R109 Unspecified abdominal pain: Secondary | ICD-10-CM | POA: Insufficient documentation

## 2011-02-03 NOTE — Assessment & Plan Note (Signed)
Strongly c/w pancreatitis. Pt not interested in hospitalization. Obtain cbc, chem7, amylase, lipase, lft. Attempt oxycodone prn. Attempt to avoid po food and maintain po fluid hydration. Advised strongly to seek immediate medical attention at closest ED if abdominal pain worsens or is accompanied by n/v, f/c or bleeding. States understanding and agreement.

## 2011-02-03 NOTE — Progress Notes (Signed)
  Subjective:    Patient ID: Willie [name], male    DOB: 30-Sep-1975, 35 y.o.   MRN: [id number]  HPI Pt presents to clinic for evaluation of abdominal pain. Notes one day h/o upper abdominal pain onset 4am without radiation. No associated n/v, f/c. Not eating food secondary to pain exacerbation but is attempting water for hydration. Has h/o alcohol pancreatitis in the past and states recently drinking 12 pack of beer daily since passing of brother. No current evidence of intoxification or withdrawal. Rates pain 8/10 but is attempting to avoid hospitalization due to need to provide support for family at home. Also notes recent increased bp that predates abdominal pain. Had episode of of visual changes accompanying elevated bp at home. Compliant with current bp medication without adverse effect. No other complaint.  Past Medical History  Diagnosis Date  . Alcoholic pancreatitis     recurrent  . Diabetes mellitus, type II     New Onset 03/2010  . Hypertension   . 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 S/P lumbar microdiscectomy 11/2001    Dr Danielle Dess   Past Surgical History  Procedure Date  . Lumbar microdiscectomy     Dr Danielle Dess    reports that he has never smoked. He has never used smokeless tobacco. He reports that he drinks about 2.4 ounces of alcohol per week. He reports that he does not use illicit drugs. family history includes Diabetes in his mother; Lung cancer in his brother; and Pancreatic cancer in his paternal aunt.  There is no history of Colon cancer. No Known Allergies     Review of Systems see hpi     Objective:   Physical Exam  Constitutional: He appears well-developed and well-nourished.       Appears uncomfortable in pain  HENT:  Head: Normocephalic and atraumatic.  Eyes: Conjunctivae are normal. Pupils are equal, round, and reactive to light. No scleral icterus.  Fundoscopic exam:      The right eye shows no AV  nicking, no exudate, no hemorrhage and no papilledema.       The left eye shows no AV nicking, no exudate, no hemorrhage and no papilledema.  Neck: Neck supple.  Cardiovascular: Normal rate, regular rhythm and normal heart sounds.  Exam reveals no gallop and no friction rub.   No murmur heard. Pulmonary/Chest: Effort normal and breath sounds normal. No respiratory distress. He has no wheezes. He has no rales.  Abdominal: Soft. Normal appearance and bowel sounds are normal. He exhibits no distension and no mass. There is tenderness in the epigastric area and periumbilical area. There is no rigidity, no rebound, no guarding and negative Murphy's sign.  Neurological: He is alert.  Skin: Skin is warm and dry. He is not diaphoretic.          Assessment & Plan:

## 2011-02-03 NOTE — Assessment & Plan Note (Signed)
suboptimal control pre-dating abd pain. Add norvasc 5mg  po qd. Monitor bp closely and f/u in clinic closely as scheduled.

## 2011-02-04 ENCOUNTER — Ambulatory Visit (INDEPENDENT_AMBULATORY_CARE_PROVIDER_SITE_OTHER): Payer: 59 | Admitting: Internal Medicine

## 2011-02-04 ENCOUNTER — Encounter: Payer: Self-pay | Admitting: Internal Medicine

## 2011-02-04 DIAGNOSIS — K859 Acute pancreatitis without necrosis or infection, unspecified: Secondary | ICD-10-CM

## 2011-02-04 DIAGNOSIS — I10 Essential (primary) hypertension: Secondary | ICD-10-CM

## 2011-02-04 MED ORDER — HYDROMORPHONE HCL 2 MG PO TABS: 2.0000 mg | ORAL_TABLET | ORAL | Status: AC | PRN

## 2011-02-04 NOTE — Patient Instructions (Signed)
Please stop your lipitor until we can discuss it next week. Also hold the norvasc until you are re-evaluated.

## 2011-02-05 DIAGNOSIS — K861 Other chronic pancreatitis: Secondary | ICD-10-CM | POA: Insufficient documentation

## 2011-02-05 NOTE — Assessment & Plan Note (Signed)
Hold norvasc. Monitor bp as outpt and follow up closely as scheduled.

## 2011-02-05 NOTE — Assessment & Plan Note (Signed)
Mildly improved. Rf dilaudid for short term prn use. Slow advance diet as tolerated. Consider repeat lipase/lfts next visit. Work note provided. Discussion held regarding etoh use. Advised cannot resume etoh use in future. Aware of potential morbidity and/or mortality involved with pancreatitis as well as complications affecting liver.

## 2011-02-05 NOTE — Progress Notes (Signed)
  Subjective:    Patient ID: Willie [name], male    DOB: Apr 07, 1976, 35 y.o.   MRN: [id number]  HPI Pt presents to clinic for follow up of pancreatitis and HTN. Did ultimately present to the ED due to severe pain. Received dilaudid and ivf. Lipase confirmed elevated as well as mildly elevated lfts. CT scan reviewed demonstrating changes c/w pancreatitis but without evidence of necrosis, abscess, or phlegmon. States is some better since last visit. Pain is 2/10 with dilaudid and 8/10 without. Has advanced diet to soft food intermittently. No f/c or n/v. fsbs recently in low 100's without hyoglycemia. No further etoh ingestion and no evidence of withdrawal. Currently holding bp medications due to low bp's ?80's. bp reviewed currently as normotensive. No other complaints.  Past Medical History  Diagnosis Date  . Alcoholic pancreatitis     recurrent  . Diabetes mellitus, type II     New Onset 03/2010  . Hypertension   . 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 S/P lumbar microdiscectomy 11/2001    Dr Danielle Dess   Past Surgical History  Procedure Date  . Lumbar microdiscectomy     Dr Danielle Dess    reports that he has never smoked. He has never used smokeless tobacco. He reports that he drinks about 2.4 ounces of alcohol per week. He reports that he does not use illicit drugs. family history includes Diabetes in his mother; Lung cancer in his brother; and Pancreatic cancer in his paternal aunt.  There is no history of Colon cancer. No Known Allergies     Review of Systems see hpi     Objective:   Physical Exam  Nursing note and vitals reviewed. Constitutional: He appears well-developed and well-nourished. No distress.  HENT:  Head: Normocephalic and atraumatic.  Eyes: Conjunctivae are normal. No scleral icterus.  Abdominal:       Defers abdominal exam  Neurological: He is alert.  Skin: He is not diaphoretic.  Psychiatric: He has a normal  mood and affect.          Assessment & Plan:

## 2011-02-11 ENCOUNTER — Ambulatory Visit (INDEPENDENT_AMBULATORY_CARE_PROVIDER_SITE_OTHER): Payer: 59 | Admitting: Family

## 2011-02-11 ENCOUNTER — Encounter: Payer: Self-pay | Admitting: Family

## 2011-02-11 VITALS — BP 120/82 | HR 78 | Temp 97.8°F | Resp 16 | Ht 70.5 in | Wt 257.1 lb

## 2011-02-11 DIAGNOSIS — K625 Hemorrhage of anus and rectum: Secondary | ICD-10-CM

## 2011-02-11 DIAGNOSIS — R209 Unspecified disturbances of skin sensation: Secondary | ICD-10-CM

## 2011-02-11 DIAGNOSIS — I10 Essential (primary) hypertension: Secondary | ICD-10-CM

## 2011-02-11 DIAGNOSIS — R202 Paresthesia of skin: Secondary | ICD-10-CM

## 2011-02-11 DIAGNOSIS — K859 Acute pancreatitis without necrosis or infection, unspecified: Secondary | ICD-10-CM

## 2011-02-11 LAB — CBC WITH DIFFERENTIAL/PLATELET
Basophils Relative: 1 % (ref 0–1)
HCT: 39.2 % (ref 39.0–52.0)
Hemoglobin: 13.7 g/dL (ref 13.0–17.0)
Lymphs Abs: 1.9 10*3/uL (ref 0.7–4.0)
MCH: 30.6 pg (ref 26.0–34.0)
MCHC: 34.9 g/dL (ref 30.0–36.0)
Monocytes Absolute: 0.5 10*3/uL (ref 0.1–1.0)
Monocytes Relative: 7 % (ref 3–12)
Neutro Abs: 3.7 10*3/uL (ref 1.7–7.7)
RBC: 4.47 MIL/uL (ref 4.22–5.81)

## 2011-02-11 LAB — HEPATIC FUNCTION PANEL
Albumin: 4.6 g/dL (ref 3.5–5.2)
Alkaline Phosphatase: 66 U/L (ref 39–117)
Indirect Bilirubin: 0.5 mg/dL (ref 0.0–0.9)
Total Bilirubin: 0.6 mg/dL (ref 0.3–1.2)

## 2011-02-11 LAB — LIPASE: Lipase: 31 U/L (ref 0–75)

## 2011-02-11 MED ORDER — OXYCODONE HCL 5 MG PO TABS: 5.0000 mg | ORAL_TABLET | ORAL | Status: DC | PRN

## 2011-02-11 NOTE — Assessment & Plan Note (Signed)
He has had recurrent rectal bleeding.  In the setting of recent pancreatitis, will arrange a follow up appointment with Dr. Christella Hartigan.  Check CBC today.

## 2011-02-11 NOTE — Assessment & Plan Note (Signed)
Will check follow up lipase, lft's, cbc today. The patient was commended for discontinuing use of alcohol and I reinforced the importance of staying off of alcohol completely. He verbalizes understanding.  He is requesting a refill on his pain medication and I told him that I would give him one small refill, but that he is to try to get off of pain medication as he will be returning to work in a few days.

## 2011-02-11 NOTE — Assessment & Plan Note (Signed)
BP is stable.

## 2011-02-11 NOTE — Assessment & Plan Note (Signed)
Will check B12/folate (at risk for low levels due to ETOH abuse).  Peripheral neuropathy in the setting of DM2 is also a consideration.

## 2011-02-11 NOTE — Patient Instructions (Addendum)
You will be contacted about your referral to Dr. Christella Hartigan.  Avoid all alcohol. Complete your blood work on the first floor today. Call if you develop recurrent rectal bleeding, or worsening abdominal pain. Go to the ER if severe. Follow up in 1 month.

## 2011-02-11 NOTE — Progress Notes (Signed)
Subjective:    Patient ID: Willie [name], male    DOB: [birth year], 35 y.o.   MRN: [id number]  HPI  Mr.  Willie [name] is a 35 yr old male who presents today for follow up of his pancreatitis.  Pancreatitis- has discontinued use of alcohol completely.  Still having some abdominal discomfort.    Rectal bleeding.  Had with bowel movements started Thursday/friday.  Had a normal non-bloody bowel movement this AM.  Denies vomitting.  + burping. Denies melena.  Some right sided abdominal pain. He has had an evaluation in the past by Dr. Christella Hartigan for rectal bleeding and was noted to have diverticulosis.  Depression- brother passed away on 02-11-23.  He is taking citalopram.  Feels that his alcohol binge was related to the stress of his brother's death.  Overall, he feels that his depression is well controlled at this point.   HTN-  Tolerating his medications without difficulty.  Hyperlipidemia- lipitor was discontinued last visit.  LE numbness- notes intermittent LE numbness worse at night.  Review of Systems See HPI  Past Medical History  Diagnosis Date  . Alcoholic pancreatitis     recurrent  . Diabetes mellitus, type II     New Onset 03/2010  . Hypertension   . 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 S/P lumbar microdiscectomy 11/2001    Dr Danielle Dess    History   Social History  . Marital Status: Married    Spouse Name: N/A    Number of Children: 2  . 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 Smoking status: Never Smoker   . Smokeless tobacco: Never Used  . Alcohol Use: 2.4 oz/week    4 Cans of beer per week  . Drug Use: No  . Sexually Active: Not on file   Other Topics Concern  . 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 Lumbar microdiscectomy     Dr Danielle Dess    Family History  Problem Relation Age of Onset  . Diabetes Mother   . Lung cancer Brother     twin brother  . Colon cancer Neg Hx   . Pancreatic cancer Paternal Aunt     No Known Allergies  Current Outpatient Prescriptions on File Prior to Visit  Medication Sig Dispense Refill  . ALPRAZolam (XANAX) 0.5 MG tablet 0.5 mg 2 (two) times daily as needed. For anxiety       . bisoprolol (ZEBETA) 10 MG tablet Take 1 tablet (10 mg total) by mouth at bedtime.  90 tablet  1  . citalopram (CELEXA) 20 MG tablet Take 1 tablet (20 mg total) by mouth daily.  30 tablet  3  . glucose blood (ONE TOUCH TEST STRIPS) test strip 1 each. Use to check blood sugar once day       . HYDROmorphone (DILAUDID) 2 MG tablet Take 1 tablet (2 mg total) by mouth every 4 (four) hours as needed for pain.  30 tablet  0  . metFORMIN (GLUCOPHAGE) 500 MG tablet Take two tablets in the morning and one tablet in the evening      .  multivitamin (ONE-A-DAY MEN'S) TABS Take 1 tablet by mouth daily.        Marland Kitchen NEXIUM 40 MG capsule TAKE 1 CAPSULE BY MOUTH AT BEDTIME  30 capsule  2  . olmesartan-hydrochlorothiazide (BENICAR HCT) 40-25 MG per tablet Take 1 tablet by mouth daily.  60 tablet  2  . Omega-3 Fatty Acids (FISH OIL) 1000 MG CAPS Take 2,000 mg by mouth 2 (two) times daily.       . ONE TOUCH LANCETS MISC by Does not apply route. Use to check blood sugar once a day       . amLODipine (NORVASC) 5 MG tablet Take 1 tablet (5 mg total) by mouth daily.  30 tablet  0  . atorvastatin (LIPITOR) 10 MG tablet Take 1 tablet (10 mg total) by mouth daily.  90 tablet  0   Current Facility-Administered Medications on File Prior to Visit  Medication Dose Route Frequency Provider Last Rate Last Dose  . 0.9 %  sodium chloride infusion  500 mL Intravenous Continuous Rob Bunting, MD        BP 120/82  Pulse 78   Temp(Src) 97.8 F (36.6 C) (Oral)  Resp 16  Ht 5' 10.5" (1.791 m)  Wt 257 lb 1.3 oz (116.611 kg)  BMI 36.37 kg/m2       Objective:   Physical Exam  Constitutional: He appears well-developed and well-nourished.  HENT:  Head: Normocephalic and atraumatic.  Cardiovascular: Normal rate and regular rhythm.   No murmur heard. Pulmonary/Chest: Effort normal and breath sounds normal. No respiratory distress. He has no wheezes. He has no rales. He exhibits no tenderness.  Abdominal: Soft.       Mild epigastric tenderness to palpation. + bowel sounds, no guarding.  Skin:       Dry skin is noted.   Psychiatric: He has a normal mood and affect. His speech is normal and behavior is normal. Judgment and thought content normal. Cognition and memory are normal.          Assessment & Plan:

## 2011-02-12 ENCOUNTER — Encounter: Payer: Self-pay | Admitting: Family

## 2011-02-12 LAB — FOLATE: Folate: >20 ng/mL

## 2011-03-04 ENCOUNTER — Telehealth: Payer: Self-pay | Admitting: Family

## 2011-03-04 MED ORDER — BISOPROLOL FUMARATE 10 MG PO TABS: 10.0000 mg | ORAL_TABLET | Freq: Every day | ORAL | Status: DC

## 2011-03-04 NOTE — Telephone Encounter (Signed)
Refill bisoprolol fumarate 10 mg take 1 tab by mouth at bedtime qty 90 last fill 11-26-2010

## 2011-03-04 NOTE — Telephone Encounter (Signed)
Refill sent to pharmacy #90 x 1 refill. 

## 2011-03-05 ENCOUNTER — Ambulatory Visit: Payer: 59 | Admitting: Gastroenterology

## 2011-03-13 ENCOUNTER — Ambulatory Visit: Payer: 59 | Admitting: Gastroenterology

## 2011-03-19 ENCOUNTER — Other Ambulatory Visit: Payer: Self-pay | Admitting: Family

## 2011-03-19 NOTE — Telephone Encounter (Signed)
Refill left on pharmacy voicemail for alprazolam 0.5mg  1/2 tablet twice a day and 1 tablet at bedtime #60 x no refills.

## 2011-03-20 ENCOUNTER — Ambulatory Visit: Payer: 59 | Admitting: Family

## 2011-03-26 ENCOUNTER — Ambulatory Visit: Payer: 59 | Admitting: Family

## 2011-04-09 ENCOUNTER — Ambulatory Visit: Payer: 59 | Admitting: Family

## 2011-04-17 ENCOUNTER — Encounter: Payer: Self-pay | Admitting: Family

## 2011-04-17 ENCOUNTER — Other Ambulatory Visit: Payer: Self-pay | Admitting: Family

## 2011-04-17 ENCOUNTER — Emergency Department (INDEPENDENT_AMBULATORY_CARE_PROVIDER_SITE_OTHER): Payer: 59

## 2011-04-17 ENCOUNTER — Ambulatory Visit (INDEPENDENT_AMBULATORY_CARE_PROVIDER_SITE_OTHER): Payer: 59 | Admitting: Family

## 2011-04-17 ENCOUNTER — Emergency Department (HOSPITAL_BASED_OUTPATIENT_CLINIC_OR_DEPARTMENT_OTHER)
Admission: EM | Admit: 2011-04-17 | Discharge: 2011-04-17 | Disposition: A | Discharge status: Home / Self Care | Payer: 59 | Attending: Emergency Medicine | Admitting: Emergency Medicine

## 2011-04-17 ENCOUNTER — Encounter (HOSPITAL_BASED_OUTPATIENT_CLINIC_OR_DEPARTMENT_OTHER): Payer: Self-pay | Admitting: *Deleted

## 2011-04-17 DIAGNOSIS — R1031 Right lower quadrant pain: Secondary | ICD-10-CM | POA: Insufficient documentation

## 2011-04-17 DIAGNOSIS — E119 Type 2 diabetes mellitus without complications: Secondary | ICD-10-CM | POA: Insufficient documentation

## 2011-04-17 DIAGNOSIS — R197 Diarrhea, unspecified: Secondary | ICD-10-CM

## 2011-04-17 DIAGNOSIS — Z79899 Other long term (current) drug therapy: Secondary | ICD-10-CM | POA: Insufficient documentation

## 2011-04-17 DIAGNOSIS — R109 Unspecified abdominal pain: Secondary | ICD-10-CM

## 2011-04-17 DIAGNOSIS — R1011 Right upper quadrant pain: Secondary | ICD-10-CM | POA: Insufficient documentation

## 2011-04-17 DIAGNOSIS — K625 Hemorrhage of anus and rectum: Secondary | ICD-10-CM

## 2011-04-17 DIAGNOSIS — F329 Major depressive disorder, single episode, unspecified: Secondary | ICD-10-CM

## 2011-04-17 DIAGNOSIS — I10 Essential (primary) hypertension: Secondary | ICD-10-CM | POA: Insufficient documentation

## 2011-04-17 DIAGNOSIS — K7689 Other specified diseases of liver: Secondary | ICD-10-CM | POA: Insufficient documentation

## 2011-04-17 DIAGNOSIS — K5730 Diverticulosis of large intestine without perforation or abscess without bleeding: Secondary | ICD-10-CM

## 2011-04-17 LAB — DIFFERENTIAL
Basophils Relative: 0 % (ref 0–1)
Eosinophils Absolute: 0.3 10*3/uL (ref 0.0–0.7)
Eosinophils Relative: 5 % (ref 0–5)
Lymphs Abs: 0.9 10*3/uL (ref 0.7–4.0)
Monocytes Absolute: 0.7 10*3/uL (ref 0.1–1.0)

## 2011-04-17 LAB — COMPREHENSIVE METABOLIC PANEL
ALT: 83 U/L — ABNORMAL HIGH (ref 0–53)
AST: 55 U/L — ABNORMAL HIGH (ref 0–37)
Albumin: 4.1 g/dL (ref 3.5–5.2)
CO2: 30 mEq/L (ref 19–32)
Chloride: 98 mEq/L (ref 96–112)
Creatinine, Ser: 1 mg/dL (ref 0.50–1.35)
Sodium: 139 mEq/L (ref 135–145)
Total Bilirubin: 0.6 mg/dL (ref 0.3–1.2)

## 2011-04-17 LAB — CBC
Hemoglobin: 13.6 g/dL (ref 13.0–17.0)
MCH: 29.4 pg (ref 26.0–34.0)
MCHC: 35.6 g/dL (ref 30.0–36.0)
MCV: 82.7 fL (ref 78.0–100.0)
Platelets: 240 10*3/uL (ref 150–400)
RBC: 4.62 MIL/uL (ref 4.22–5.81)

## 2011-04-17 LAB — URINALYSIS, ROUTINE W REFLEX MICROSCOPIC
Leukocytes, UA: NEGATIVE
Nitrite: NEGATIVE
Specific Gravity, Urine: 1.025 (ref 1.005–1.030)
Urobilinogen, UA: 1 mg/dL (ref 0.0–1.0)
pH: 6 (ref 5.0–8.0)

## 2011-04-17 MED ORDER — ONDANSETRON HCL 4 MG/2ML IJ SOLN
4.0000 mg | Freq: Once | INTRAMUSCULAR | Status: AC
  Administered 2011-04-17: 4 mg via INTRAVENOUS
  Filled 2011-04-17: qty 2

## 2011-04-17 MED ORDER — IOHEXOL 300 MG/ML  SOLN
100.0000 mL | Freq: Once | INTRAMUSCULAR | Status: AC | PRN
  Administered 2011-04-17: 100 mL via INTRAVENOUS

## 2011-04-17 MED ORDER — IOHEXOL 300 MG/ML  SOLN
18.0000 mL | Freq: Once | INTRAMUSCULAR | Status: AC | PRN
  Administered 2011-04-17: 18 mL via ORAL

## 2011-04-17 MED ORDER — MORPHINE SULFATE 4 MG/ML IJ SOLN
4.0000 mg | Freq: Once | INTRAMUSCULAR | Status: AC
  Administered 2011-04-17: 4 mg via INTRAVENOUS
  Filled 2011-04-17: qty 1

## 2011-04-17 MED ORDER — HYDROCODONE-ACETAMINOPHEN 5-500 MG PO TABS: 1.0000 | ORAL_TABLET | Freq: Four times a day (QID) | ORAL | Status: AC | PRN

## 2011-04-17 NOTE — Assessment & Plan Note (Signed)
BP is stable on current meds. Continue same.   BP Readings from Last 3 Encounters:  04/17/11 120/70  02/11/11 120/82  02/04/11 122/80

## 2011-04-17 NOTE — Patient Instructions (Addendum)
Please go directly to the ED on the first floor.  Follow up in 1 week.

## 2011-04-17 NOTE — Assessment & Plan Note (Signed)
Clinically stable. He will need an a1c, hepatic panel next visit.

## 2011-04-17 NOTE — Assessment & Plan Note (Signed)
This is reasonably well controlled.  I commended him for staying off of alcohol.  His brother died a few months back and he finds the holiday season to be more difficult.

## 2011-04-17 NOTE — Assessment & Plan Note (Addendum)
35 yr old male with acute RLQ pain.  Recent rectal bleeding.  Will refer to ED for further evaluation. Diverticulitis and Appendicitis remain in differential.  Report was given to [facility] the Consulting civil engineer in the [facility].

## 2011-04-17 NOTE — Progress Notes (Signed)
Subjective:    Patient ID: Willie [name], male    DOB: [birth year], 35 y.o.   MRN: [id number]  HPI  Mr.  Woolever is a 35 yr old male who presents today to discuss rectal bleeding and follow up.  1) Rectal bleeding- started 2 weeks ago.  With each BM- he has seen blood on the tissue and in the bowl.  Note BM for 2 days.  He felt really weak on Sunday.  Had cold/chills. No fever.  Never saw Dr. Christella Hartigan. He has associated right lower quadrant pain which he rates as a 6/10 at rest.  He has known hx of hemorrhoids on colonoscopy.    2) Depression- reports that things are "so so."  He is not drinking.  He is taking citalopram without difficulty.  He takes xanax at night.   3) DM2-  He reports that he has not checked his sugars for 3 days.  Reports that his numbers have been in the low 100's in the morning.  He continues metformin.  He has not had a flu shot.    4)HTN-  He is on amlodipine and benicar HCT.     Review of Systems See HPI  Past Medical History  Diagnosis Date  . Alcoholic pancreatitis     recurrent  . Diabetes mellitus, type II     New Onset 03/2010  . Hypertension   . 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 S/P lumbar microdiscectomy 11/2001    Dr Danielle Dess    History   Social History  . Marital Status: Married    Spouse Name: N/A    Number of Children: 2  . 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 Smoking status: Never Smoker   . Smokeless tobacco: Never Used  . Alcohol Use: 2.4 oz/week    4 Cans of beer per week  . Drug Use: No  . Sexually Active: Not on file   Other Topics Concern  . 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 Lumbar microdiscectomy     Dr Danielle Dess    Family History  Problem Relation Age of Onset  . Diabetes Mother   . Lung cancer Brother     twin brother  . Colon cancer Neg Hx   . Pancreatic cancer Paternal Aunt     No Known Allergies  Current Outpatient Prescriptions on File Prior to Visit  Medication Sig Dispense Refill  . ALPRAZolam (XANAX) 0.5 MG tablet TAKE 1/2 TABLET BY MOUTH TWICE DAILY AND 1 TABLET AT BEDTIME AS DIRECTED  60 tablet  0  . amLODipine (NORVASC) 5 MG tablet Take 1 tablet (5 mg total) by mouth daily.  30 tablet  0  . atorvastatin (LIPITOR) 10 MG tablet Take 1 tablet (10 mg total) by mouth daily.  90 tablet  0  . bisoprolol (ZEBETA) 10 MG tablet Take 1 tablet (10 mg total) by mouth at bedtime.  90 tablet  1  . glucose blood (ONE TOUCH TEST STRIPS) test strip 1 each. Use to check blood sugar once day       .  metFORMIN (GLUCOPHAGE) 500 MG tablet Take two tablets in the morning and one tablet in the evening      . multivitamin (ONE-A-DAY MEN'S) TABS Take 1 tablet by mouth daily.        Marland Kitchen NEXIUM 40 MG capsule TAKE 1 CAPSULE BY MOUTH AT BEDTIME  30 capsule  2  . olmesartan-hydrochlorothiazide (BENICAR HCT) 40-25 MG per tablet Take 1 tablet by mouth daily.  60 tablet  2  . Omega-3 Fatty Acids (FISH OIL) 1000 MG CAPS Take 2,000 mg by mouth 2 (two) times daily.       . ONE TOUCH LANCETS MISC by Does not apply route. Use to check blood sugar once a day        Current Facility-Administered Medications on File Prior to Visit  Medication Dose Route Frequency Provider Last Rate Last Dose  . 0.9 %  sodium chloride infusion  500 mL Intravenous Continuous Rob Bunting, MD        BP 120/70  Pulse 90  Temp(Src) 97.9 F (36.6 C) (Oral)  Resp 18  Ht 5' 10.5" (1.791 m)  Wt 264 lb 1.9 oz (119.804 kg)  BMI 37.36 kg/m2       Objective:   Physical Exam  Constitutional: He appears well-developed and well-nourished. No distress.  Eyes: No scleral icterus.        Pale conjunctiva  Cardiovascular: Normal rate and regular rhythm.   No murmur heard. Pulmonary/Chest: Effort normal and breath sounds normal. No respiratory distress. He has no wheezes. He has no rales. He exhibits no tenderness.  Abdominal: Soft. There is tenderness.       RLQ tenderness with mild guarding.    Skin: Skin is warm.       Dry skin  Psychiatric: He has a normal mood and affect. His behavior is normal. Judgment and thought content normal.          Assessment & Plan:

## 2011-04-17 NOTE — Telephone Encounter (Signed)
Rx refill sent to pharmacy. 

## 2011-04-17 NOTE — ED Provider Notes (Signed)
History     CSN: [id number] Arrival date & time: 04/17/2011  4:25 PM   First MD Initiated Contact with Patient 04/17/11 1646      Chief Complaint  Patient presents with  . Abdominal Pain  . Rectal Bleeding    (Consider location/radiation/quality/duration/timing/severity/associated sxs/prior treatment) HPI Comments: Pt states that he has a history of rectal bleeding and had colonoscopy and his doctor told him he had internal hemorrhoids:pt states that he has not had bleeding or a bm in the last 4 days:pt states that he was having right lower abdominal pain on exam in the pcp office today  Patient is a 35 y.o. male presenting with abdominal pain and hematochezia. The history is provided by the patient. No language interpreter was used.  Abdominal Pain The primary symptoms of the illness include abdominal pain, nausea, diarrhea and hematochezia. The primary symptoms of the illness do not include vomiting. The current episode started more than 2 days ago. The onset of the illness was gradual. The problem has not changed since onset. The patient has not had a change in bowel habit.  Rectal Bleeding  The current episode started more than 2 weeks ago. The problem occurs occasionally. The problem has been unchanged. The patient is experiencing no pain. The stool is described as mixed with blood, streaked with blood and bloody. Associated symptoms include abdominal pain, diarrhea and nausea. Pertinent negatives include no vomiting.    Past Medical History  Diagnosis Date  . Alcoholic pancreatitis     recurrent  . Diabetes mellitus, type II     New Onset 03/2010  . Hypertension   . 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 S/P lumbar microdiscectomy 11/2001    Dr Danielle Dess    Past Surgical History  Procedure Date  . Lumbar microdiscectomy     Dr Danielle Dess    Family History  Problem Relation Age of Onset  . Diabetes Mother   . Lung cancer  Brother     twin brother  . Colon cancer Neg Hx   . Pancreatic cancer Paternal Aunt     History  Substance Use Topics  . Smoking status: Never Smoker   . Smokeless tobacco: Never Used  . Alcohol Use: 2.4 oz/week    4 Cans of beer per week      Review of Systems  Gastrointestinal: Positive for nausea, abdominal pain, diarrhea and hematochezia. Negative for vomiting.  All other systems reviewed and are negative.    Allergies  Review of patient's allergies indicates no known allergies.  Home Medications   Current Outpatient Rx  Name Route Sig Dispense Refill  . ALPRAZOLAM 0.5 MG PO TABS  TAKE 1/2 TABLET BY MOUTH TWICE DAILY AND 1 TABLET AT BEDTIME AS DIRECTED 60 tablet 0  . ATORVASTATIN CALCIUM 10 MG PO TABS Oral Take 1 tablet (10 mg total) by mouth daily. 90 tablet 0  . CITALOPRAM HYDROBROMIDE 20 MG PO TABS  TAKE 1 TABLET (20 MG TOTAL) BY MOUTH DAILY. 30 tablet 3  . GLUCOSE BLOOD VI STRP  1 each. Use to check blood sugar once day     . METFORMIN HCL 500 MG PO TABS  Take two tablets in the morning and one tablet in the evening    . ONE-A-DAY MENS PO TABS Oral Take 1 tablet by mouth daily.      Marland Kitchen NEXIUM 40 MG PO CPDR  TAKE 1 CAPSULE  BY MOUTH AT BEDTIME 30 capsule 2  . OLMESARTAN MEDOXOMIL-HCTZ 40-25 MG PO TABS Oral Take 1 tablet by mouth daily. 60 tablet 2  . FISH OIL 1000 MG PO CAPS Oral Take 1,000 mg by mouth 2 (two) times daily.     Letta Pate LANCETS MISC Does not apply by Does not apply route. Use to check blood sugar once a day       BP 106/74  Pulse 84  Temp(Src) 98.8 F (37.1 C) (Oral)  Resp 16  Ht 6' (1.829 m)  Wt 260 lb (117.935 kg)  BMI 35.26 kg/m2  SpO2 100%  Physical Exam  Nursing note and vitals reviewed. Constitutional: He is oriented to person, place, and time. He appears well-developed and well-nourished.  HENT:  Head: Normocephalic and atraumatic.  Neck: Normal range of motion. Neck supple.  Cardiovascular: Normal rate and regular rhythm.     Pulmonary/Chest: Effort normal and breath sounds normal.  Abdominal: There is tenderness in the right upper quadrant and right lower quadrant.  Genitourinary: Rectum normal.  Musculoskeletal: Normal range of motion.  Neurological: He is alert and oriented to person, place, and time.  Skin: Skin is warm and dry.  Psychiatric: He has a normal mood and affect.    ED Course  Procedures (including critical care time)  Labs Reviewed  URINALYSIS, ROUTINE W REFLEX MICROSCOPIC - Abnormal; Notable for the following:    Color, Urine AMBER (*) BIOCHEMICALS MAY BE AFFECTED BY COLOR   Bilirubin Urine SMALL (*)    All other components within normal limits  COMPREHENSIVE METABOLIC PANEL - Abnormal; Notable for the following:    Glucose, Bld 114 (*)    AST 55 (*)    ALT 83 (*)    All other components within normal limits  CBC - Abnormal; Notable for the following:    HCT 38.2 (*)    All other components within normal limits  DIFFERENTIAL  LIPASE, BLOOD  OCCULT BLOOD X 1 CARD TO LAB, STOOL  POCT OCCULT BLOOD STOOL, DEVICE   Ct Abdomen Pelvis W Contrast  04/17/2011  *RADIOLOGY REPORT*  Clinical Data: Abdominal pain, right lower quadrant pain, rectal bleeding, diarrhea  CT ABDOMEN AND PELVIS WITH CONTRAST  Technique:  Multidetector CT imaging of the abdomen and pelvis was performed following the standard protocol during bolus administration of intravenous contrast.  Contrast: 18mL OMNIPAQUE IOHEXOL 300 MG/ML IV SOLN, OMNIPAQUE IOHEXOL 300 MG/ML IV SOLN  Comparison: 01/31/2011  Findings: Lung bases are unremarkable.  Sagittal images of the spine shows again mild disc space flattening at L5 S1 level.  Fatty infiltration of the liver again noted.  No intrahepatic biliary ductal dilatation.  No calcified gallstones are noted within gallbladder.  The spleen, and adrenal glands are unremarkable.  The unenhanced pancreas is unremarkable.  There is no evidence of pancreatitis.  Kidneys are symmetrical in  size and enhancement.  No hydronephrosis or hydroureter.  There is no evidence of small bowel obstruction.  No ascites or free air.  No adenopathy.  No pericecal inflammation.  Some stool noted within the cecum.  Normal appendix is clearly visualized in axial image 57.  A few diverticula are noted in the distal left colon and proximal sigmoid colon without evidence of acute diverticulitis.  Prostate gland and seminal vesicles are unremarkable.  Urinary bladder is unremarkable.  No distal colonic obstruction.  Some stool noted in sigmoid colon. The rectum is empty.  No destructive bony lesions are noted within pelvis.  No  inguinal adenopathy.  IMPRESSION:  1.  No acute inflammatory process within abdomen or pelvis. 2.  Fatty infiltration of the liver again noted. 3.  Normal appearing pancreas.  The previous pancreatitis has resolved. 4.  Normal appendix.  No pericecal inflammation. 5.  Few colonic diverticula are noted distal left colon without evidence of acute diverticulitis.  Original Report Authenticated By: Natasha Mead, M.D.     1. Abdominal pain   2. Rectal bleeding       MDM  No acute finding noted:pt ast and alt getting getting:no bleeding here:pt is okay to follow up with his gi doctor   Medical screening examination/treatment/procedure(s) were performed by non-physician practitioner and as supervising physician I was immediately available for consultation/collaboration. Osvaldo Human, M.D.       Teressa Lower, NP 04/17/11 1922  Carleene Cooper III, MD 04/18/11 1325

## 2011-04-17 NOTE — ED Notes (Signed)
Pt sent here from [facility] office c/o lower right abd pain and rectal bleeding

## 2011-05-01 ENCOUNTER — Other Ambulatory Visit: Payer: Self-pay | Admitting: Family

## 2011-05-01 ENCOUNTER — Telehealth: Payer: Self-pay | Admitting: Family

## 2011-05-01 MED ORDER — ESOMEPRAZOLE MAGNESIUM 40 MG PO CPDR: 40.0000 mg | DELAYED_RELEASE_CAPSULE | Freq: Every day | ORAL | Status: DC

## 2011-05-01 NOTE — Telephone Encounter (Signed)
Rx sent to pharmacy for #90 x 1 refill.

## 2011-05-01 NOTE — Telephone Encounter (Signed)
Refill- nexium 40mg  capsule. Take one capsule by mouth at bedtime. Last fill 9.28.12  Pharmacy comments: patient is requesting a 3 month supply

## 2011-05-01 NOTE — Telephone Encounter (Signed)
Pharmacy called requesting refill on metformin.

## 2011-05-02 ENCOUNTER — Other Ambulatory Visit: Payer: Self-pay | Admitting: Family

## 2011-05-17 ENCOUNTER — Other Ambulatory Visit: Payer: Self-pay | Admitting: Family

## 2011-05-17 ENCOUNTER — Ambulatory Visit (INDEPENDENT_AMBULATORY_CARE_PROVIDER_SITE_OTHER): Payer: 59 | Admitting: Family

## 2011-05-17 DIAGNOSIS — Z23 Encounter for immunization: Secondary | ICD-10-CM

## 2011-05-17 MED ORDER — ALPRAZOLAM 0.5 MG PO TABS: 0.5000 mg | ORAL_TABLET | Freq: Two times a day (BID) | ORAL | Status: DC

## 2011-05-17 NOTE — Telephone Encounter (Signed)
Rx refill for Alprazolam provided to patient at office visit for flu shot 05/17/2011.

## 2011-05-30 ENCOUNTER — Other Ambulatory Visit: Payer: Self-pay | Admitting: Family

## 2011-05-30 NOTE — Telephone Encounter (Signed)
Refill previously sent to pharmacy on 05/01/11 #90 x 1 refill. Verified receipt with [facility]. Current request denied.

## 2011-07-16 ENCOUNTER — Other Ambulatory Visit: Payer: Self-pay | Admitting: Family

## 2011-07-16 NOTE — Telephone Encounter (Signed)
Refill left on pharmacy voicemail Alprazolam 0.5mg  1 tablet twice a day, #60 x no refills.

## 2011-08-02 ENCOUNTER — Other Ambulatory Visit: Payer: Self-pay | Admitting: Family

## 2011-08-02 NOTE — Telephone Encounter (Signed)
Rx refill sent to pharmacy. 

## 2011-08-07 ENCOUNTER — Other Ambulatory Visit: Payer: Self-pay | Admitting: Family

## 2011-08-07 NOTE — Telephone Encounter (Signed)
Refills sent to pharmacy for citalopram, atorvastatin and metformin. Pt has no future appts on file. Please advise when pt will be due for office visit?

## 2011-08-07 NOTE — Telephone Encounter (Signed)
He is overdue for follow up and should be seen soon please.

## 2011-08-07 NOTE — Telephone Encounter (Signed)
Please call pt to arrange f/u. 

## 2011-08-08 NOTE — Telephone Encounter (Signed)
Left message on cell stating that refills have been sent to pharmacy and that the patient needs to call to schedule a follow up appointment with [name].

## 2011-09-06 ENCOUNTER — Encounter (HOSPITAL_BASED_OUTPATIENT_CLINIC_OR_DEPARTMENT_OTHER): Payer: Self-pay

## 2011-09-06 ENCOUNTER — Ambulatory Visit: Payer: 59 | Admitting: Family

## 2011-09-06 ENCOUNTER — Emergency Department (HOSPITAL_BASED_OUTPATIENT_CLINIC_OR_DEPARTMENT_OTHER)
Admission: EM | Admit: 2011-09-06 | Discharge: 2011-09-06 | Disposition: A | Discharge status: Home / Self Care | Payer: 59 | Attending: Emergency Medicine | Admitting: Emergency Medicine

## 2011-09-06 ENCOUNTER — Other Ambulatory Visit: Payer: Self-pay | Admitting: Family

## 2011-09-06 DIAGNOSIS — I10 Essential (primary) hypertension: Secondary | ICD-10-CM | POA: Insufficient documentation

## 2011-09-06 DIAGNOSIS — E119 Type 2 diabetes mellitus without complications: Secondary | ICD-10-CM | POA: Insufficient documentation

## 2011-09-06 DIAGNOSIS — K859 Acute pancreatitis without necrosis or infection, unspecified: Secondary | ICD-10-CM | POA: Insufficient documentation

## 2011-09-06 DIAGNOSIS — R1013 Epigastric pain: Secondary | ICD-10-CM | POA: Insufficient documentation

## 2011-09-06 LAB — COMPREHENSIVE METABOLIC PANEL
Alkaline Phosphatase: 74 U/L (ref 39–117)
BUN: 13 mg/dL (ref 6–23)
Calcium: 9.8 mg/dL (ref 8.4–10.5)
Creatinine, Ser: 1 mg/dL (ref 0.50–1.35)
GFR calc Af Amer: >90 mL/min (ref >90)
Glucose, Bld: 164 mg/dL — ABNORMAL HIGH (ref 70–99)
Potassium: 3.6 mEq/L (ref 3.5–5.1)
Total Protein: 7.9 g/dL (ref 6.0–8.3)

## 2011-09-06 LAB — URINALYSIS, ROUTINE W REFLEX MICROSCOPIC
Bilirubin Urine: NEGATIVE
Nitrite: NEGATIVE
Specific Gravity, Urine: 1.023 (ref 1.005–1.030)
Urobilinogen, UA: 0.2 mg/dL (ref 0.0–1.0)

## 2011-09-06 LAB — DIFFERENTIAL
Eosinophils Absolute: 0.1 10*3/uL (ref 0.0–0.7)
Eosinophils Relative: 1 % (ref 0–5)
Lymphs Abs: 1.7 10*3/uL (ref 0.7–4.0)
Monocytes Absolute: 0.5 10*3/uL (ref 0.1–1.0)
Monocytes Relative: 5 % (ref 3–12)

## 2011-09-06 LAB — CBC
Hemoglobin: 15.1 g/dL (ref 13.0–17.0)
MCH: 29.7 pg (ref 26.0–34.0)
MCV: 81.9 fL (ref 78.0–100.0)
RBC: 5.08 MIL/uL (ref 4.22–5.81)

## 2011-09-06 LAB — LIPASE, BLOOD: Lipase: 704 U/L — ABNORMAL HIGH (ref 11–59)

## 2011-09-06 MED ORDER — ONDANSETRON 4 MG PO TBDP: 4.0000 mg | ORAL_TABLET | Freq: Three times a day (TID) | ORAL | Status: AC | PRN

## 2011-09-06 MED ORDER — HYDROMORPHONE HCL PF 1 MG/ML IJ SOLN
1.0000 mg | Freq: Once | INTRAMUSCULAR | Status: AC
  Administered 2011-09-06: 1 mg via INTRAVENOUS
  Filled 2011-09-06: qty 1

## 2011-09-06 MED ORDER — SODIUM CHLORIDE 0.9 % IV SOLN
1000.0000 mL | Freq: Once | INTRAVENOUS | Status: AC
  Administered 2011-09-06: 1000 mL via INTRAVENOUS

## 2011-09-06 MED ORDER — OXYCODONE-ACETAMINOPHEN 5-325 MG PO TABS: 2.0000 | ORAL_TABLET | ORAL | Status: DC | PRN

## 2011-09-06 MED ORDER — ONDANSETRON HCL 4 MG/2ML IJ SOLN
4.0000 mg | Freq: Once | INTRAMUSCULAR | Status: AC
  Administered 2011-09-06: 4 mg via INTRAVENOUS
  Filled 2011-09-06: qty 2

## 2011-09-06 NOTE — Discharge Instructions (Signed)
Acute Pancreatitis The pancreas is a large gland located behind your stomach. It produces (secretes) enzymes. These enzymes help digest food. It also releases the hormones glucagon and insulin. These hormones help regulate blood sugar. When the pancreas becomes inflamed, the disease is called pancreatitis. Inflammation of the pancreas occurs when enzymes from the pancreas begin attacking and digesting the pancreas. CAUSES  Most cases ofsudden onset (acute) pancreatitis are caused by:  Alcohol abuse.   Gallstones.  Other less common causes are:  Some medications.   Exposure to certain chemicals   Infection.   Damage caused by an accident (trauma).   Surgery of the belly (abdomen).  SYMPTOMS  Acute pancreatitis usually begins with pain in the upper abdomen and may radiate to the back. This pain may last a couple days. The constant pain varies from mild to severe. The acute form of this disease may vary from mild, nonspecific abdominal pain to profound shock with coma. About 1 in 5 cases are severe. These patients become dehydrated and develop low blood pressure. In severe cases, bleeding into the pancreas can lead to shock and death. The lungs, heart, and kidneys may fail. DIAGNOSIS  Your caregiver will form a clinical opinion after giving you an exam. Laboratory work is used to confirm this diagnosis. Often,a digestive enzyme from the pancreas (serum amylase) and other enzymes are elevated. Sugars and fats (lipids) in the blood may be elevated. There may also be changes in the following levels: calcium, magnesium, potassium, chloride and bicarbonate (chemicals in the blood). X-rays, a CT scan, or ultrasound of your abdomen may be necessary to search for other causes of your abdominal pain. TREATMENT  Most pancreatitis requires treatment of symptoms. Most acute attacks last a couple of days. Your caregiver can discuss the treatment options with you.  If complications occur, hospitalization  may be necessary for pain control and intravenous (IV) fluid replacement.   Sometimes, a tube may be put into the stomach to control vomiting.   Food may not be allowed for 3 to 4 days. This gives the pancreas time to rest. Giving the pancreas a rest means there is no stimulation that would produce more enzymes and cause more damage.   Medicines (antibiotics) that kill germs may be given if infection is the cause.   Sometimes, surgery may be required.   Following an acute attack, your caregiver will determine the cause, if possible, and offer suggestions to prevent recurrences.  HOME CARE INSTRUCTIONS   Eat smaller, more frequent meals. This reduces the amount of digestive juices the pancreas produces.   Decrease the amount of fat in your diet. This may help reduce loose, diarrheal stools.   Drink enough water and fluids to keep your urine clear or pale yellow. This is to avoid dehydration which can cause increased pain.   Talk to your caregiver about pain relievers or other medicines that may help.   Avoid anything that may have triggered your pancreatitis (for example, alcohol).   Follow the diet advised by your caregiver. Do not advance the diet too soon.   Take medicines as prescribed.   Get plenty of rest.   Check your blood sugar at home as directed by your caregiver.   If your caregiver has given you a follow-up appointment, it is very important to keep that appointment. Not keeping the appointment could result in a lasting (chronic) or permanent injury, pain, and disability. If there is any problem keeping the appointment, you must call to reschedule.    SEEK MEDICAL CARE IF:   You are not recovering in the time described by your caregiver.   You have persistent pain, weakness, or feel sick to your stomach (nauseous).   You have recovered and then have another bout of pain.  SEEK IMMEDIATE MEDICAL CARE IF:   You are unable to eat or keep fluids down.   Your pain  increases a lot or changes.   You have an oral temperature above 102 F (38.9 C), not controlled by medicine.   Your skin or the white part of your eyes look yellow (jaundice).   You develop vomiting.   You feel dizzy or faint.   Your blood sugar is high (over 300).  MAKE SURE YOU:   Understand these instructions.   Will watch your condition.   Will get help right away if you are not doing well or get worse.  Document Released: 04/15/2005 Document Revised: 04/04/2011 Document Reviewed: 11/27/2007 [employer] Patient Information 2012 [employer]. 

## 2011-09-06 NOTE — ED Notes (Addendum)
Pt c/o abdominal pain.  Pt states he has HX of pancreatitis and feels like onset of same.  Last ETOH 3 days ago.  Pt states he had appt upstairs with Willie [name] at 330pm but unable to wait.

## 2011-09-06 NOTE — Telephone Encounter (Signed)
Bisoprolol is no longer on pt's current med list. Please advise if he should still be taking it?

## 2011-09-06 NOTE — ED Provider Notes (Signed)
History     CSN: [id number]  Arrival date & time 09/06/11  1424   First MD Initiated Contact with Patient 09/06/11 1503      Chief Complaint  Patient presents with  . Abdominal Pain    (Consider location/radiation/quality/duration/timing/severity/associated sxs/prior treatment) HPI Comments: Pt states that he has a history of pancreatitis and he drank 4 beers 3 days ago:pt states that he hasn't had a problem in the last year  Patient is a 36 y.o. male presenting with abdominal pain. The history is provided by the patient. No language interpreter was used.  Abdominal Pain The primary symptoms of the illness include abdominal pain. The primary symptoms of the illness do not include fever, nausea, vomiting or dysuria. The current episode started 2 days ago. The onset of the illness was gradual. The problem has not changed since onset. The illness is associated with alcohol use.    Past Medical History  Diagnosis Date  . Alcoholic pancreatitis     recurrent  . Diabetes mellitus, type II     New Onset 03/2010  . Hypertension   . 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 S/P lumbar microdiscectomy 11/2001    Dr Danielle Dess    Past Surgical History  Procedure Date  . Lumbar microdiscectomy     Dr Danielle Dess    Family History  Problem Relation Age of Onset  . Diabetes Mother   . Lung cancer Brother     twin brother  . Colon cancer Neg Hx   . Pancreatic cancer Paternal Aunt     History  Substance Use Topics  . Smoking status: Never Smoker   . Smokeless tobacco: Never Used  . Alcohol Use: 2.4 oz/week    4 Cans of beer per week      Review of Systems  Constitutional: Negative for fever.  Respiratory: Negative.   Cardiovascular: Negative.   Gastrointestinal: Positive for abdominal pain. Negative for nausea and vomiting.  Genitourinary: Negative for dysuria.    Allergies  Review of patient's allergies indicates no known  allergies.  Home Medications   Current Outpatient Rx  Name Route Sig Dispense Refill  . ACETAMINOPHEN 500 MG PO TABS Oral Take 1,000 mg by mouth every 6 (six) hours as needed. Patient used this medication for pain.    Marland Kitchen ALPRAZOLAM 0.5 MG PO TABS  TAKE 1 TABLET BY MOUTH TWICE DAILY 60 tablet 0  . ATORVASTATIN CALCIUM 10 MG PO TABS  TAKE 1 TABLET BY MOUTH DAILY. 90 tablet 0  . BENICAR HCT 40-25 MG PO TABS  TAKE 1 TABLET BY MOUTH DAILY. 60 tablet 2  . BISOPROLOL FUMARATE 10 MG PO TABS  TAKE 1 TABLET BY MOUTH AT BEDTIME. 90 tablet 0  . CITALOPRAM HYDROBROMIDE 20 MG PO TABS  TAKE 1 TABLET (20 MG TOTAL) BY MOUTH DAILY. 30 tablet 2  . ESOMEPRAZOLE MAGNESIUM 40 MG PO CPDR Oral Take 1 capsule (40 mg total) by mouth at bedtime. 90 capsule 1  . GLUCOSE BLOOD VI STRP  1 each. Use to check blood sugar once day     . METFORMIN HCL 500 MG PO TABS  Take two tablets in the morning and one tablet in the evening    . ONE-A-DAY MENS PO TABS Oral Take 1 tablet by mouth daily.      Letta Pate LANCETS MISC Does not apply by Does not apply route. Use to check blood  sugar once a day       BP 143/103  Pulse 81  Temp(Src) 98.9 F (37.2 C) (Oral)  Resp 18  Ht 5\' 11"  (1.803 m)  Wt 270 lb (122.471 kg)  BMI 37.66 kg/m2  SpO2 99%  Physical Exam  Nursing note and vitals reviewed. Constitutional: He is oriented to person, place, and time. He appears well-developed and well-nourished.  HENT:  Head: Normocephalic and atraumatic.  Eyes: Conjunctivae and EOM are normal.  Neck: Neck supple.  Cardiovascular: Normal rate and regular rhythm.   Pulmonary/Chest: Effort normal and breath sounds normal.  Abdominal: Soft. Bowel sounds are normal. There is tenderness in the epigastric area.  Musculoskeletal: Normal range of motion.  Neurological: He is alert and oriented to person, place, and time.  Skin: Skin is warm and dry.  Psychiatric: He has a normal mood and affect.    ED Course  Procedures (including critical  care time)  Labs Reviewed  CBC - Abnormal; Notable for the following:    MCHC 36.3 (*)    All other components within normal limits  DIFFERENTIAL - Abnormal; Notable for the following:    Neutrophils Relative 78 (*)    Neutro Abs 8.1 (*)    All other components within normal limits  COMPREHENSIVE METABOLIC PANEL - Abnormal; Notable for the following:    Glucose, Bld 164 (*)    AST 72 (*)    ALT 123 (*)    All other components within normal limits  LIPASE, BLOOD - Abnormal; Notable for the following:    Lipase 704 (*)    All other components within normal limits  URINALYSIS, ROUTINE W REFLEX MICROSCOPIC  ETHANOL   No results found.   1. Pancreatitis       MDM  Pt feeling better at this time and is ready to go home:pt is tolerating po at this time:abdomen is not acute at this time:will treat with pain and nausea medication at home        Teressa Lower, NP 09/06/11 1757

## 2011-09-06 NOTE — Telephone Encounter (Signed)
Per verbal from Provider, pt called back and is still taking bisoprolol. Ok to send refills. Refill sent to pharmacy.

## 2011-09-06 NOTE — Telephone Encounter (Signed)
Left message on cell phone requesting call back.  Need to confirm that he is still taking bisoprolol.  If he is taking, ok to continue.

## 2011-09-09 ENCOUNTER — Other Ambulatory Visit: Payer: 59

## 2011-09-09 ENCOUNTER — Encounter: Payer: Self-pay | Admitting: Family

## 2011-09-09 ENCOUNTER — Ambulatory Visit (HOSPITAL_COMMUNITY)
Admission: RE | Admit: 2011-09-09 | Discharge: 2011-09-09 | Disposition: A | Discharge status: Home / Self Care | Payer: 59 | Source: Ambulatory Visit | Attending: Family | Admitting: Family

## 2011-09-09 ENCOUNTER — Ambulatory Visit (INDEPENDENT_AMBULATORY_CARE_PROVIDER_SITE_OTHER): Payer: 59 | Admitting: Family

## 2011-09-09 VITALS — HR 75 | Temp 97.9°F | Resp 18 | Ht 70.5 in | Wt 261.0 lb

## 2011-09-09 DIAGNOSIS — R109 Unspecified abdominal pain: Secondary | ICD-10-CM | POA: Insufficient documentation

## 2011-09-09 DIAGNOSIS — F329 Major depressive disorder, single episode, unspecified: Secondary | ICD-10-CM

## 2011-09-09 DIAGNOSIS — K859 Acute pancreatitis without necrosis or infection, unspecified: Secondary | ICD-10-CM

## 2011-09-09 DIAGNOSIS — K449 Diaphragmatic hernia without obstruction or gangrene: Secondary | ICD-10-CM | POA: Insufficient documentation

## 2011-09-09 DIAGNOSIS — K429 Umbilical hernia without obstruction or gangrene: Secondary | ICD-10-CM | POA: Insufficient documentation

## 2011-09-09 DIAGNOSIS — K7689 Other specified diseases of liver: Secondary | ICD-10-CM | POA: Insufficient documentation

## 2011-09-09 LAB — CBC WITH DIFFERENTIAL/PLATELET
Basophils Relative: 0 % (ref 0–1)
Eosinophils Absolute: 0.3 10*3/uL (ref 0.0–0.7)
Eosinophils Relative: 3 % (ref 0–5)
MCH: 29.8 pg (ref 26.0–34.0)
MCHC: 35.3 g/dL (ref 30.0–36.0)
MCV: 84.4 fL (ref 78.0–100.0)
Monocytes Relative: 7 % (ref 3–12)
Neutrophils Relative %: 62 % (ref 43–77)
Platelets: 291 10*3/uL (ref 150–400)

## 2011-09-09 LAB — HEPATIC FUNCTION PANEL
AST: 108 U/L — ABNORMAL HIGH (ref 0–37)
Albumin: 4.8 g/dL (ref 3.5–5.2)
Alkaline Phosphatase: 70 U/L (ref 39–117)
Total Bilirubin: 0.9 mg/dL (ref 0.3–1.2)
Total Protein: 7.6 g/dL (ref 6.0–8.3)

## 2011-09-09 LAB — LIPASE: Lipase: 210 U/L — ABNORMAL HIGH (ref 0–75)

## 2011-09-09 MED ORDER — IOHEXOL 300 MG/ML  SOLN
100.0000 mL | Freq: Once | INTRAMUSCULAR | Status: AC | PRN
  Administered 2011-09-09: 100 mL via INTRAVENOUS

## 2011-09-09 MED ORDER — OXYCODONE-ACETAMINOPHEN 5-325 MG PO TABS: 2.0000 | ORAL_TABLET | ORAL | Status: DC | PRN

## 2011-09-09 NOTE — Patient Instructions (Signed)
Hold metformin for 48 hours after CT. Complete blood work prior to leaving.  Complete CT on the first floor today.  Continue clear liquids. Follow up on Wednesday- go to ER if fever over 101, worsening abdominal pain.

## 2011-09-09 NOTE — Assessment & Plan Note (Signed)
He continues citalopram.  Overall, seems to be doing fairly well considering his family stressors. I did advise him to avoid alcohol.  Monitor.

## 2011-09-09 NOTE — Progress Notes (Signed)
Subjective:    Patient ID: JOEANTHONY SEELING, male    DOB: 02-06-1976, 36 y.o.   MRN: [id number]  HPI  Mr. Mochizuki is a 36 yr old male who presents today for ED follow up.  He was evaluated in the ED on 5/10 due to abdominal pain and was diagnosed with pancreatitis.  ED records are reviewed.  He was noted to have elevated transaminases and a lipase of 700.  No abdominal imaging was performed.  He was given IV abx, rx for percocet which he has completed and told to maintain a liquid diet for 4 days.  Since going home he notes that the epigastric pain has improved, but he is now having lower abdominal pain- especially in the LLQ.  He denies associated vomiting or fevers since discharge from the ED. He has been using percocet every 4-6 hours- which helps his pain for about 4 hours at a time.  He does report that he drank last week  A "6 pack of beer multiple nights in a row."  Was "stressed out."    Depression/Anxiety- notes that he has been "stressed out."  His brother died a few months back and he reports that he has been stressed about his mother who is having a tough time with the situation.  Work remains stressful.  He continues citalopram without difficulty.   Review of Systems See HPI  Past Medical History  Diagnosis Date  . Alcoholic pancreatitis     recurrent  . Diabetes mellitus, type II     New Onset 03/2010  . Hypertension   . 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 S/P lumbar microdiscectomy 11/2001    Dr Danielle Dess    History   Social History  . Marital Status: Married    Spouse Name: N/A    Number of Children: 2  . 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 Smoking status: Never Smoker   . Smokeless tobacco: Never Used  . Alcohol Use: 2.4 oz/week    4 Cans of beer per week  . Drug Use: No  . Sexually Active: Not on file   Other  Topics Concern  . 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 Lumbar microdiscectomy     Dr Danielle Dess    Family History  Problem Relation Age of Onset  . Diabetes Mother   . Lung cancer Brother     twin brother  . Colon cancer Neg Hx   . Pancreatic cancer Paternal Aunt     No Known Allergies  Current Outpatient Prescriptions on File Prior to Visit  Medication Sig Dispense Refill  . ALPRAZolam (XANAX) 0.5 MG tablet TAKE 1 TABLET BY MOUTH TWICE DAILY  60 tablet  0  . atorvastatin (LIPITOR) 10 MG tablet TAKE 1 TABLET BY MOUTH DAILY.  90 tablet  0  . BENICAR HCT 40-25 MG per tablet TAKE 1 TABLET BY MOUTH DAILY.  60 tablet  2  . bisoprolol (ZEBETA) 10 MG tablet TAKE 1 TABLET BY MOUTH AT BEDTIME.  90 tablet  0  . citalopram (CELEXA) 20 MG  tablet TAKE 1 TABLET (20 MG TOTAL) BY MOUTH DAILY.  30 tablet  2  . esomeprazole (NEXIUM) 40 MG capsule Take 1 capsule (40 mg total) by mouth at bedtime.  90 capsule  1  . glucose blood (ONE TOUCH TEST STRIPS) test strip 1 each. Use to check blood sugar once day       . metFORMIN (GLUCOPHAGE) 500 MG tablet Take two tablets in the morning and one tablet in the evening      . multivitamin (ONE-A-DAY MEN'S) TABS Take 1 tablet by mouth daily.        . ondansetron (ZOFRAN ODT) 4 MG disintegrating tablet Take 1 tablet (4 mg total) by mouth every 8 (eight) hours as needed for nausea.  20 tablet  0  . ONE TOUCH LANCETS MISC by Does not apply route. Use to check blood sugar once a day       . acetaminophen (TYLENOL) 500 MG tablet Take 1,000 mg by mouth every 6 (six) hours as needed. Patient used this medication for pain.        Pulse 75  Temp(Src) 97.9 F (36.6 C) (Oral)  Resp 18  Ht 5' 10.5" (1.791 m)  Wt 261 lb 0.6 oz (118.407 kg)  BMI 36.93 kg/m2  SpO2  96%       Objective:   Physical Exam  Constitutional: He appears well-developed and well-nourished. No distress.  Cardiovascular: Normal rate and regular rhythm.   No murmur heard. Pulmonary/Chest: Effort normal and breath sounds normal. No respiratory distress. He has no wheezes. He has no rales. He exhibits no tenderness.  Abdominal: Soft. Bowel sounds are normal. He exhibits no distension.       Mild left upper quadrant tenderness to palpation without rebound tenderness or guarding.    LLQ tenderness is noted to be moderate without rebound or guarding.   Musculoskeletal: He exhibits no edema.  Psychiatric: He has a normal mood and affect. His speech is normal and behavior is normal. Judgment and thought content normal. Cognition and memory are normal.          Assessment & Plan:

## 2011-09-09 NOTE — Assessment & Plan Note (Addendum)
Pt with recurrent pancreatitis in setting of alcohol use.  Recommended that he continue clear liquid diet for now.   Counseled on importance of abstinence from alcohol.  Will repeat follow up LFT's/amylase today along with CBC.   I am concerned about possible colitis with the LLQ pain.  Obtain contrasted CT abdomen. Pt instructed to hold metformin x 2 days following contrast.   Refilled percocet.  Follow up in 2 days, go to ER if symptoms worsen.

## 2011-09-10 ENCOUNTER — Telehealth: Payer: Self-pay | Admitting: Family

## 2011-09-10 NOTE — Telephone Encounter (Signed)
Spoke with pt.  Reports that he is feeling ok today.  Reviewed CT and lab results and instructed pt to follow up in office tomorrow.

## 2011-09-11 ENCOUNTER — Ambulatory Visit (INDEPENDENT_AMBULATORY_CARE_PROVIDER_SITE_OTHER): Payer: 59 | Admitting: Family

## 2011-09-11 ENCOUNTER — Encounter: Payer: Self-pay | Admitting: Family

## 2011-09-11 VITALS — BP 110/80 | HR 68 | Temp 97.6°F | Resp 18 | Wt 262.0 lb

## 2011-09-11 DIAGNOSIS — K859 Acute pancreatitis without necrosis or infection, unspecified: Secondary | ICD-10-CM

## 2011-09-11 DIAGNOSIS — R42 Dizziness and giddiness: Secondary | ICD-10-CM

## 2011-09-11 NOTE — Assessment & Plan Note (Signed)
Pt's BP is on low side today- I have asked him to hold BP meds today, restart tomorrow with parameters as outlined in AVS.  Hopefully as pt advances his diet his dizziness will improve.

## 2011-09-11 NOTE — Assessment & Plan Note (Signed)
Clinically improving and labs on Monday were trending downward.  Will advance to full liquid diet and then regular diet as tolerated. Follow up in 1 week, sooner if problems/concerns.

## 2011-09-11 NOTE — Progress Notes (Signed)
Subjective:    Patient ID: Willie [name], male    DOB: March 06, 1976, 36 y.o.   MRN: [id number]  HPI  Mr. Arriaga is a 36 yr old male who presents today for follow up of his pancreatitis.  He underwent CT on Monday which noted "mild uncomplicated pancreatitis." He reports that he continues to have abdominal pain but that it is much improved from last week when he was seen in the ED.  He is tolerating a clear liquid diet. Ate a bowel of oatmeal today.  Dizziness- He reports that he has had random episodes of dizziness.  On one episode he checked his blood pressure  reports that his BP was 110/58. Happened again last night and a few times throughout the day. He has not taken his BP meds today.      Review of Systems See HPI  Past Medical History  Diagnosis Date  . Alcoholic pancreatitis     recurrent  . Diabetes mellitus, type II     New Onset 03/2010  . Hypertension   . 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 S/P lumbar microdiscectomy 11/2001    Dr Danielle Dess    History   Social History  . Marital Status: Married    Spouse Name: N/A    Number of Children: 2  . 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 Smoking status: Never Smoker   . Smokeless tobacco: Never Used  . Alcohol Use: 2.4 oz/week    4 Cans of beer per week  . Drug Use: No  . Sexually Active: Not on file   Other Topics Concern  . 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 Lumbar microdiscectomy     Dr Danielle Dess    Family History  Problem Relation Age of Onset  . Diabetes Mother   . Lung cancer Brother     twin brother  . Colon cancer Neg Hx   . Pancreatic cancer  Paternal Aunt     No Known Allergies  Current Outpatient Prescriptions on File Prior to Visit  Medication Sig Dispense Refill  . ALPRAZolam (XANAX) 0.5 MG tablet TAKE 1 TABLET BY MOUTH TWICE DAILY  60 tablet  0  . atorvastatin (LIPITOR) 10 MG tablet TAKE 1 TABLET BY MOUTH DAILY.  90 tablet  0  . BENICAR HCT 40-25 MG per tablet TAKE 1 TABLET BY MOUTH DAILY.  60 tablet  2  . bisoprolol (ZEBETA) 10 MG tablet TAKE 1 TABLET BY MOUTH AT BEDTIME.  90 tablet  0  . citalopram (CELEXA) 20 MG tablet TAKE 1 TABLET (20 MG TOTAL) BY MOUTH DAILY.  30 tablet  2  . esomeprazole (NEXIUM) 40 MG capsule Take 1 capsule (40 mg total) by mouth at bedtime.  90 capsule  1  . glucose blood (ONE TOUCH TEST STRIPS) test strip 1 each. Use to check blood sugar once day       . metFORMIN (GLUCOPHAGE) 500 MG tablet Take two tablets in the morning and one tablet in the evening      . multivitamin (ONE-A-DAY MEN'S) TABS Take  1 tablet by mouth daily.        . ondansetron (ZOFRAN ODT) 4 MG disintegrating tablet Take 1 tablet (4 mg total) by mouth every 8 (eight) hours as needed for nausea.  20 tablet  0  . ONE TOUCH LANCETS MISC by Does not apply route. Use to check blood sugar once a day       . oxyCODONE-acetaminophen (PERCOCET) 5-325 MG per tablet Take 2 tablets by mouth every 4 (four) hours as needed for pain.  40 tablet  0    BP 110/80  Pulse 68  Temp(Src) 97.6 F (36.4 C) (Oral)  Resp 18  Wt 262 lb (118.842 kg)  SpO2 98%       Objective:   Physical Exam  Constitutional: He appears well-developed and well-nourished. No distress.  Cardiovascular: Normal rate and regular rhythm.   No murmur heard. Pulmonary/Chest: Effort normal and breath sounds normal. No respiratory distress. He has no wheezes. He has no rales. He exhibits no tenderness.  Abdominal: Bowel sounds are normal. He exhibits no distension. There is tenderness in the epigastric area.  Musculoskeletal: He exhibits no edema.  Psychiatric: He has a  normal mood and affect. His behavior is normal. Judgment and thought content normal.          Assessment & Plan:

## 2011-09-11 NOTE — Patient Instructions (Signed)
You may advance your diet to a full liquid diet today.  If you are able to tolerate full liquids, may advance to regular diet in 24 hours.  Hold Blood pressure medications today.  May restart BP meds tomorrow at half dose if BP >120/80. Call if Blood pressure <90/50. Follow up in 1 week.

## 2011-09-12 NOTE — ED Provider Notes (Signed)
Medical screening examination/treatment/procedure(s) were performed by non-physician practitioner and as supervising physician I was immediately available for consultation/collaboration.   Ziare Cryder Y. Amorita Vanrossum, MD 09/12/11 1949 

## 2011-09-18 ENCOUNTER — Ambulatory Visit: Payer: 59 | Admitting: Family

## 2011-09-18 ENCOUNTER — Encounter: Payer: Self-pay | Admitting: Family

## 2011-09-18 ENCOUNTER — Ambulatory Visit (INDEPENDENT_AMBULATORY_CARE_PROVIDER_SITE_OTHER): Payer: 59 | Admitting: Family

## 2011-09-18 VITALS — BP 132/88 | HR 76 | Temp 97.8°F | Resp 16 | Ht 70.5 in | Wt 269.1 lb

## 2011-09-18 DIAGNOSIS — F329 Major depressive disorder, single episode, unspecified: Secondary | ICD-10-CM

## 2011-09-18 DIAGNOSIS — K859 Acute pancreatitis without necrosis or infection, unspecified: Secondary | ICD-10-CM

## 2011-09-18 DIAGNOSIS — R42 Dizziness and giddiness: Secondary | ICD-10-CM

## 2011-09-18 LAB — CBC WITH DIFFERENTIAL/PLATELET
Hemoglobin: 13.2 g/dL (ref 13.0–17.0)
Lymphocytes Relative: 33 % (ref 12–46)
Lymphs Abs: 2.5 10*3/uL (ref 0.7–4.0)
MCH: 29.4 pg (ref 26.0–34.0)
Monocytes Relative: 5 % (ref 3–12)
Neutro Abs: 4.4 10*3/uL (ref 1.7–7.7)
Neutrophils Relative %: 59 % (ref 43–77)
Platelets: 302 10*3/uL (ref 150–400)
RBC: 4.49 MIL/uL (ref 4.22–5.81)
WBC: 7.4 10*3/uL (ref 4.0–10.5)

## 2011-09-18 LAB — HEPATIC FUNCTION PANEL
ALT: 93 U/L — ABNORMAL HIGH (ref 0–53)
Indirect Bilirubin: 0.3 mg/dL (ref 0.0–0.9)
Total Protein: 6.8 g/dL (ref 6.0–8.3)

## 2011-09-18 LAB — LIPASE: Lipase: 42 U/L (ref 0–75)

## 2011-09-18 MED ORDER — OXYCODONE-ACETAMINOPHEN 5-325 MG PO TABS: 2.0000 | ORAL_TABLET | ORAL | Status: AC | PRN

## 2011-09-18 MED ORDER — ALPRAZOLAM 0.5 MG PO TABS: ORAL_TABLET | ORAL | Status: DC

## 2011-09-18 NOTE — Assessment & Plan Note (Signed)
Well controlled.  Will try cutting his HS alprazolam in half to see if he will tolerate, and if possible, pt then to try to D/C.

## 2011-09-18 NOTE — Assessment & Plan Note (Signed)
Still having some post prandial pain.  Otherwise, clinically improving and he continues to abstain from alcohol.  Will refer back to Dr.  Christella Hartigan GI for further evaluation of his pancreatitis and recent hematochezia.  Obtain follow up lab work today.

## 2011-09-18 NOTE — Patient Instructions (Signed)
You will be contact about your referral to Dr. Christella Hartigan. Please let us know if you have not heard back within 1 week about your referral. Complete your labs prior to leaving. Follow up in 6 weeks.

## 2011-09-18 NOTE — Assessment & Plan Note (Signed)
May be related to addition of percocet.  In the meantime, recommended trial of claritin prn.  Monitor.

## 2011-09-18 NOTE — Progress Notes (Signed)
Subjective:    Patient ID: Willie [name], male    DOB: 05-01-1975, 36 y.o.   MRN: [id number]  HPI  Mr.  Willie [name] is a 36 yr old male who presents today for follow up of his pancreatitis.  He has advanced to a regular diet and reports that he is tolerating food, but still has some post prandial abdominal pain- though it is better than in the beginning.  He continues to use pain pills as needed. He reports a small amount of bright red blood in his stools after he advanced his diet, this has resolved.    Depression- this is now well controlled on citalopram.  He is using alprazolam HS for sleep only.   Dizziness- notes that he has been experiencing brief episodes of dizziness.    Review of Systems See HPI  Past Medical History  Diagnosis Date  . Alcoholic pancreatitis     recurrent  . Diabetes mellitus, type II     New Onset 03/2010  . Hypertension   . 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 S/P lumbar microdiscectomy 11/2001    Dr Danielle Dess    History   Social History  . Marital Status: Married    Spouse Name: N/A    Number of Children: 2  . 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 Smoking status: Never Smoker   . Smokeless tobacco: Never Used  . Alcohol Use: 2.4 oz/week    4 Cans of beer per week  . Drug Use: No  . Sexually Active: Not on file   Other Topics Concern  . 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 Lumbar microdiscectomy     Dr Danielle Dess    Family History  Problem Relation Age of Onset  . Diabetes Mother   . Lung cancer Brother     twin brother  . Colon cancer Neg Hx   . Pancreatic cancer Paternal Aunt      No Known Allergies  Current Outpatient Prescriptions on File Prior to Visit  Medication Sig Dispense Refill  . atorvastatin (LIPITOR) 10 MG tablet TAKE 1 TABLET BY MOUTH DAILY.  90 tablet  0  . BENICAR HCT 40-25 MG per tablet TAKE 1 TABLET BY MOUTH DAILY.  60 tablet  2  . bisoprolol (ZEBETA) 10 MG tablet TAKE 1 TABLET BY MOUTH AT BEDTIME.  90 tablet  0  . citalopram (CELEXA) 20 MG tablet TAKE 1 TABLET (20 MG TOTAL) BY MOUTH DAILY.  30 tablet  2  . esomeprazole (NEXIUM) 40 MG capsule Take 1 capsule (40 mg total) by mouth at bedtime.  90 capsule  1  . glucose blood (ONE TOUCH TEST STRIPS) test strip 1 each. Use to check blood sugar once day       . metFORMIN (GLUCOPHAGE) 500 MG tablet Take two tablets in the morning and one tablet in the evening      . multivitamin (ONE-A-DAY MEN'S) TABS Take 1 tablet by mouth daily.        . ONE TOUCH LANCETS MISC by Does not apply route. Use to  check blood sugar once a day         BP 132/88  Pulse 76  Temp(Src) 97.8 F (36.6 C) (Oral)  Resp 16  Ht 5' 10.5" (1.791 m)  Wt 269 lb 1.3 oz (122.054 kg)  BMI 38.06 kg/m2  SpO2 99%       Objective:   Physical Exam  Constitutional: He appears well-developed and well-nourished. No distress.  Cardiovascular: Normal rate and regular rhythm.   No murmur heard. Pulmonary/Chest: Effort normal and breath sounds normal. No respiratory distress. He has no wheezes. He has no rales. He exhibits no tenderness.  Abdominal: Soft. Bowel sounds are normal. He exhibits no distension and no mass. There is no tenderness. There is no rebound and no guarding.  Psychiatric: He has a normal mood and affect. His behavior is normal. Judgment and thought content normal.          Assessment & Plan:

## 2011-09-27 ENCOUNTER — Ambulatory Visit: Payer: 59 | Admitting: Gastroenterology

## 2011-10-01 ENCOUNTER — Telehealth: Payer: Self-pay | Admitting: Gastroenterology

## 2011-10-01 NOTE — Telephone Encounter (Signed)
Message copied by Leanor Kail I on Tue Oct 01, 2011 12:02 PM ------      Message from: Donata Duff      Created: Fri Sep 27, 2011 10:07 AM       Do not bill

## 2011-10-15 ENCOUNTER — Other Ambulatory Visit: Payer: Self-pay | Admitting: Family

## 2011-10-15 ENCOUNTER — Encounter: Payer: Self-pay | Admitting: Family

## 2011-10-15 ENCOUNTER — Ambulatory Visit (INDEPENDENT_AMBULATORY_CARE_PROVIDER_SITE_OTHER): Payer: 59 | Admitting: Family

## 2011-10-15 VITALS — BP 110/80 | HR 72 | Temp 98.2°F | Resp 16 | Ht 70.5 in | Wt 266.1 lb

## 2011-10-15 DIAGNOSIS — F341 Dysthymic disorder: Secondary | ICD-10-CM

## 2011-10-15 DIAGNOSIS — F418 Other specified anxiety disorders: Secondary | ICD-10-CM

## 2011-10-15 MED ORDER — ALPRAZOLAM 0.5 MG PO TABS: ORAL_TABLET | ORAL | Status: DC

## 2011-10-15 MED ORDER — CITALOPRAM HYDROBROMIDE 40 MG PO TABS: 40.0000 mg | ORAL_TABLET | Freq: Every day | ORAL | Status: DC

## 2011-10-15 NOTE — Patient Instructions (Addendum)
Please schedule a follow up appointment in 1 month.

## 2011-10-15 NOTE — Telephone Encounter (Signed)
Alprazolam refill called to [place] at [facility]30 x no refills and not to fill rx before 10/17/11.

## 2011-10-15 NOTE — Progress Notes (Signed)
  Subjective:    Patient ID: LARRIE FRAIZER, male    DOB: [birth year], 36 y.o.   MRN: [id number]  HPI  Mr.  Traynham is a 36 yr old male who presents today with chief complaint of anxiety.  He reports that recently he has been working hard not to drink and has been doing well with this. He has been taking 1/2 tablet of the alprazolam as needed during the day. He was unable to successfully decrease his hs dose of alprazolam due to difficulty sleeping.  He reports increased stress at work and feeling emotional due to his upcoming birthday tomorrow which he shared with his deceased twin brother. Reports episodes of "falling apart"- tearfulness, anger.  Wife tells him that he has been taking out his anger on her and the children.  He is scheduled to begin therapy next Tuesday.   Review of Systems See HPI     Objective:   Physical Exam  Constitutional: He appears well-developed and well-nourished. No distress.  Psychiatric: His speech is normal and behavior is normal. Judgment and thought content normal. Cognition and memory are normal.       Pleasant, but somewhat flat affect.            Assessment & Plan:

## 2011-10-15 NOTE — Assessment & Plan Note (Addendum)
Deteriorated. Will increase citalopram from 20mg  to 40 mg.  Continue alprazolam as he is currently taking. We discussed that with the increase in his citalopram, hopefully he will not need alprazolam as much during the day. I encouraged him to keep his upcoming apt with the therapist.  20 minutes spent with the pt today.  >50% of this time was spent counseling pt on his anxiety/depression.

## 2011-11-05 ENCOUNTER — Ambulatory Visit: Payer: 59 | Admitting: Family

## 2011-11-08 ENCOUNTER — Other Ambulatory Visit: Payer: Self-pay | Admitting: Family

## 2011-11-11 NOTE — Telephone Encounter (Signed)
Patient called regarding medication refill  

## 2011-11-11 NOTE — Telephone Encounter (Signed)
Refill sent to pharmacy and detailed message left on pt's voicemail.

## 2011-11-12 ENCOUNTER — Encounter: Payer: Self-pay | Admitting: Family

## 2011-11-12 ENCOUNTER — Ambulatory Visit (INDEPENDENT_AMBULATORY_CARE_PROVIDER_SITE_OTHER): Payer: 59 | Admitting: Family

## 2011-11-12 VITALS — BP 124/92 | HR 69 | Temp 97.7°F | Resp 16 | Ht 70.5 in | Wt 265.0 lb

## 2011-11-12 DIAGNOSIS — F418 Other specified anxiety disorders: Secondary | ICD-10-CM

## 2011-11-12 DIAGNOSIS — E119 Type 2 diabetes mellitus without complications: Secondary | ICD-10-CM

## 2011-11-12 DIAGNOSIS — F341 Dysthymic disorder: Secondary | ICD-10-CM

## 2011-11-12 LAB — HEMOGLOBIN A1C
Hgb A1c MFr Bld: 6.5 % — ABNORMAL HIGH (ref <5.7)
Mean Plasma Glucose: 140 mg/dL — ABNORMAL HIGH (ref <117)

## 2011-11-12 MED ORDER — PANTOPRAZOLE SODIUM 40 MG PO TBEC: 40.0000 mg | DELAYED_RELEASE_TABLET | Freq: Every day | ORAL | Status: DC

## 2011-11-12 MED ORDER — CITALOPRAM HYDROBROMIDE 40 MG PO TABS: 40.0000 mg | ORAL_TABLET | Freq: Every day | ORAL | Status: DC

## 2011-11-12 NOTE — Patient Instructions (Addendum)
Please follow up in 3 months. Come fasting to this appointment.  

## 2011-11-12 NOTE — Assessment & Plan Note (Signed)
Improved.  Continue 40mg  of citalopram. Follow up in 3 months.

## 2011-11-12 NOTE — Assessment & Plan Note (Signed)
Pt is due for A1C.  Will check today.

## 2011-11-12 NOTE — Progress Notes (Signed)
Subjective:    Patient ID: Willie [name], male    DOB: 01/22/1976, 36 y.o.   MRN: [id number]  HPI  Willie [name] is a 36 yr old male who presents today for follow up of his anxiety and depression.  Last visit citalopram was increased from 20mg  to 40mg .   He was struggling at that time with his upcoming birthday which he shares with his late twin brother. He reports that he is overall feeling better.  Not having panic attacks. He has been able to take on some new projects. Feels more motivated.  Sleeping somewhat better than before.  Using alprazolam at night as needed.  Review of Systems See HPI  Past Medical History  Diagnosis Date  . Alcoholic pancreatitis     recurrent  . Diabetes mellitus, type II     New Onset 03/2010  . Hypertension   . 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 S/P lumbar microdiscectomy 11/2001    Dr Danielle Dess    History   Social History  . Marital Status: Married    Spouse Name: N/A    Number of Children: 2  . 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 Smoking status: Never Smoker   . Smokeless tobacco: Never Used  . Alcohol Use: No  . Drug Use: No  . Sexually Active: Not on file   Other Topics Concern  . 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 Lumbar microdiscectomy     Dr Danielle Dess    Family History  Problem Relation Age of Onset  . Diabetes Mother   . Lung cancer Brother     twin brother  . Colon cancer Neg Hx   . Pancreatic cancer Paternal Aunt     No Known Allergies  Current Outpatient Prescriptions on File Prior to Visit  Medication Sig Dispense Refill  . ALPRAZolam (XANAX) 0.5 MG tablet 1/2  tablet by mouth as needed during the day.  1 tablet at bedtime.  45 tablet  0  . atorvastatin (LIPITOR) 10 MG tablet TAKE 1 TABLET BY MOUTH DAILY.  90 tablet  0  . BENICAR HCT 40-25 MG per tablet TAKE 1 TABLET BY MOUTH DAILY.  60 tablet  2  . bisoprolol (ZEBETA) 10 MG tablet TAKE 1 TABLET BY MOUTH AT BEDTIME.  90 tablet  0  . glucose blood (ONE TOUCH TEST STRIPS) test strip 1 each. Use to check blood sugar once day       . metFORMIN (GLUCOPHAGE) 500 MG tablet Take two tablets in the morning and one tablet in the evening      . multivitamin (ONE-A-DAY MEN'S) TABS Take 1 tablet by mouth daily.        . ONE TOUCH LANCETS MISC by Does not apply route. Use to check blood sugar once a day       . DISCONTD: citalopram (CELEXA) 40 MG tablet Take 1 tablet (40 mg total) by mouth daily.  30 tablet  1  . pantoprazole (PROTONIX) 40 MG tablet Take 1 tablet (40 mg total) by mouth daily.  30 tablet  3    BP 124/92  Pulse 69  Temp 97.7 F (36.5 C) (Oral)  Resp 16  Ht 5' 10.5" (1.791 m)  Wt 265 lb 0.6 oz (120.221 kg)  BMI 37.49 kg/m2  SpO2 98%       Objective:   Physical Exam  Constitutional: He appears well-developed and well-nourished. No distress.  Cardiovascular: Normal rate and regular rhythm.   No murmur heard. Pulmonary/Chest: Effort normal and breath sounds normal. No respiratory distress. He has no wheezes. He has no rales. He exhibits no tenderness.  Abdominal: Soft. Bowel sounds are normal. He exhibits no distension and no mass. There is no tenderness. There is no rebound and no guarding.  Psychiatric: He has a normal mood and affect. His behavior is normal. Judgment and thought content normal.          Assessment & Plan:

## 2011-11-13 ENCOUNTER — Encounter: Payer: Self-pay | Admitting: Family

## 2011-11-21 ENCOUNTER — Other Ambulatory Visit: Payer: Self-pay | Admitting: Family

## 2011-11-21 NOTE — Telephone Encounter (Signed)
Alprazolam #45 x no refills and metformin #90 x 2 refills called to [place] at [facility]

## 2011-12-10 ENCOUNTER — Other Ambulatory Visit: Payer: Self-pay | Admitting: Family

## 2011-12-11 ENCOUNTER — Ambulatory Visit (HOSPITAL_BASED_OUTPATIENT_CLINIC_OR_DEPARTMENT_OTHER)
Admission: RE | Admit: 2011-12-11 | Discharge: 2011-12-11 | Disposition: A | Discharge status: Home / Self Care | Payer: 59 | Source: Ambulatory Visit | Attending: Family | Admitting: Family

## 2011-12-11 ENCOUNTER — Telehealth: Payer: Self-pay | Admitting: Family

## 2011-12-11 ENCOUNTER — Encounter: Payer: Self-pay | Admitting: Family

## 2011-12-11 ENCOUNTER — Ambulatory Visit (INDEPENDENT_AMBULATORY_CARE_PROVIDER_SITE_OTHER): Payer: 59 | Admitting: Family

## 2011-12-11 VITALS — BP 120/84 | HR 105 | Temp 97.9°F | Resp 16 | Wt 265.0 lb

## 2011-12-11 DIAGNOSIS — R51 Headache: Secondary | ICD-10-CM | POA: Insufficient documentation

## 2011-12-11 DIAGNOSIS — S0990XA Unspecified injury of head, initial encounter: Secondary | ICD-10-CM | POA: Insufficient documentation

## 2011-12-11 DIAGNOSIS — M542 Cervicalgia: Secondary | ICD-10-CM

## 2011-12-11 DIAGNOSIS — W19XXXA Unspecified fall, initial encounter: Secondary | ICD-10-CM | POA: Insufficient documentation

## 2011-12-11 DIAGNOSIS — S0003XA Contusion of scalp, initial encounter: Secondary | ICD-10-CM | POA: Insufficient documentation

## 2011-12-11 DIAGNOSIS — S060X9A Concussion with loss of consciousness of unspecified duration, initial encounter: Secondary | ICD-10-CM | POA: Insufficient documentation

## 2011-12-11 MED ORDER — TRAMADOL HCL 50 MG PO TABS: 50.0000 mg | ORAL_TABLET | Freq: Three times a day (TID) | ORAL | Status: DC | PRN

## 2011-12-11 NOTE — Telephone Encounter (Signed)
Spoke with pt re: results of CT scan.  He reports that he had extreme discomfort of neck when CT performed.  Advised pt to return to imaging for xray of Cspine.  Pt is agreeable.  Advised pt OK to drive as long as he is not sleepy.

## 2011-12-11 NOTE — Patient Instructions (Addendum)
Do not drive until further instruction. Complete CT head on the first floor this AM. Follow up in 1 week. Go to ER if you develop recurrent nausea/vomitting, worsening headache.    Concussion and Brain Injury A blow or jolt to the head can disrupt the normal function of the brain. This type of brain injury is often called a "concussion" or a "closed head injury." Concussions are usually not life-threatening. Even so, the effects of a concussion can be serious.  CAUSES  A concussion is caused by a blunt blow to the head. The blow might be direct or indirect as described below.  Direct blow (running into another player during a soccer game, being hit in a fight, or hitting your head on a hard surface).   Indirect blow (when your head moves rapidly and violently back and forth like in a car crash).  SYMPTOMS  The brain is very complex. Every head injury is different. Some symptoms may appear right away. Other symptoms may not show up for days or weeks after the concussion. The signs of concussion can be hard to notice. Early on, problems may be missed by patients, family members, and caregivers. You may look fine even though you are acting or feeling differently.  These symptoms are usually temporary, but may last for days, weeks, or even longer. Symptoms include:  Mild headaches that will not go away.   Having more trouble than usual with:   Remembering things.   Paying attention or concentrating.   Organizing daily tasks.   Making decisions and solving problems.   Slowness in thinking, acting, speaking, or reading.   Getting lost or easily confused.   Feeling tired all the time or lacking energy (fatigue).   Feeling drowsy.   Sleep disturbances.   Sleeping more than usual.   Sleeping less than usual.   Trouble falling asleep.   Trouble sleeping (insomnia).   Loss of balance or feeling lightheaded or dizzy.   Nausea or vomiting.   Numbness or tingling.   Increased  sensitivity to:   Sounds.   Lights.   Distractions.  Other symptoms might include:  Vision problems or eyes that tire easily.   Diminished sense of taste or smell.   Ringing in the ears.   Mood changes such as feeling sad, anxious, or listless.   Becoming easily irritated or angry for little or no reason.   Lack of motivation.  DIAGNOSIS  Your caregiver can usually diagnose a concussion or mild brain injury based on your description of your injury and your symptoms.  Your evaluation might include:  A brain scan to look for signs of injury to the brain. Even if the test shows no injury, you may still have a concussion.   Blood tests to be sure other problems are not present.  TREATMENT   People with a concussion need to be examined and evaluated. Most people with concussions are treated in an emergency department, urgent care, or clinic. Some people must stay in the hospital overnight for further treatment.   Your caregiver will send you home with important instructions to follow. Be sure to carefully follow them.   Tell your caregiver if you are already taking any medicines (prescription, over-the-counter, or natural remedies), or if you are drinking alcohol or taking illegal drugs. Also, talk with your caregiver if you are taking blood thinners (anticoagulants) or aspirin. These drugs may increase your chances of complications. All of this is important information that may affect treatment.  Only take over-the-counter or prescription medicines for pain, discomfort, or fever as directed by your caregiver.  PROGNOSIS  How fast people recover from brain injury varies from person to person. Although most people have a good recovery, how quickly they improve depends on many factors. These factors include how severe their concussion was, what part of the brain was injured, their age, and how healthy they were before the concussion.  Because all head injuries are different, so is  recovery. Most people with mild injuries recover fully. Recovery can take time. In general, recovery is slower in older persons. Also, persons who have had a concussion in the past or have other medical problems may find that it takes longer to recover from their current injury. Anxiety and depression may also make it harder to adjust to the symptoms of brain injury. HOME CARE INSTRUCTIONS  Return to your normal activities slowly, not all at once. You must give your body and brain enough time for recovery.  Get plenty of sleep at night, and rest during the day. Rest helps the brain to heal.   Avoid staying up late at night.   Keep the same bedtime hours on weekends and weekdays.   Take daytime naps or rest breaks when you feel tired.   Limit activities that require a lot of thought or concentration (brain or cognitive rest). This includes:   Homework or job-related work.   Watching TV.   Computer work.   Avoid activities that could lead to a second brain injury, such as contact or recreational sports, until your caregiver says it is okay. Even after your brain injury has healed, you should protect yourself from having another concussion.   Ask your caregiver when you can return to your normal activities such as driving, bicycling, or operating heavy equipment. Your ability to react may be slower after a brain injury.   Talk with your caregiver about when you can return to work or school.   Inform your teachers, school nurse, school counselor, coach, Event organiser, or work Production designer, theatre/television/film about your injury, symptoms, and restrictions. They should be instructed to report:   Increased problems with attention or concentration.   Increased problems remembering or learning new information.   Increased time needed to complete tasks or assignments.   Increased irritability or decreased ability to cope with stress.   Increased symptoms.   Take only those medicines that your caregiver has  approved.   Do not drink alcohol until your caregiver says you are well enough to do so. Alcohol and certain other drugs may slow your recovery and can put you at risk of further injury.   If it is harder than usual to remember things, write them down.   If you are easily distracted, try to do one thing at a time. For example, do not try to watch TV while fixing dinner.   Talk with family members or close friends when making important decisions.   Keep all follow-up appointments. Repeated evaluation of your symptoms is recommended for your recovery.  PREVENTION  Protect your head from future injury. It is very important to avoid another head or brain injury before you have recovered. In rare cases, another injury has lead to permanent brain damage, brain swelling, or death. Avoid injuries by using:  Seatbelts when riding in a car.   Alcohol only in moderation.   A helmet when biking, skiing, skateboarding, skating, or doing similar activities.   Safety measures in your home.  Remove clutter and tripping hazards from floors and stairways.   Use grab bars in bathrooms and handrails by stairs.   Place non-slip mats on floors and in bathtubs.   Improve lighting in dim areas.  SEEK MEDICAL CARE IF:  A head injury can cause lingering symptoms. You should seek medical care if you have any of the following symptoms for more than 3 weeks after your injury or are planning to return to sports:  Chronic headaches.   Dizziness or balance problems.   Nausea.   Vision problems.   Increased sensitivity to noise or light.   Depression or mood swings.   Anxiety or irritability.   Memory problems.   Difficulty concentrating or paying attention.   Sleep problems.   Feeling tired all the time.  SEEK IMMEDIATE MEDICAL CARE IF:  You have had a blow or jolt to the head and you (or your family or friends) notice:  Severe or worsening headaches.   Weakness (even if only in one hand  or one leg or one part of the face), numbness, or decreased coordination.   Repeated vomiting.   Increased sleepiness or passing out.   One black center of the eye (pupil) is larger than the other.   Convulsions (seizures).   Slurred speech.   Increasing confusion, restlessness, agitation, or irritability.   Lack of ability to recognize people or places.   Neck pain.   Difficulty being awakened.   Unusual behavior changes.   Loss of consciousness.  Older adults with a brain injury may have a higher risk of serious complications such as a blood clot on the brain. Headaches that get worse or an increase in confusion are signs of this complication. If these signs occur, see a caregiver right away. MAKE SURE YOU:   Understand these instructions.   Will watch your condition.   Will get help right away if you are not doing well or get worse.  FOR MORE INFORMATION  Several groups help people with brain injury and their families. They provide information and put people in touch with local resources. These include support groups, rehabilitation services, and a variety of health care professionals. Among these groups, the Brain Injury Association (BIA, www.biausa.org) has a Secretary/administrator that gathers scientific and educational information and works on a national level to help people with brain injury.  Document Released: 07/06/2003 Document Revised: 04/04/2011 Document Reviewed: 12/02/2007 [facility] Patient Information 2012 [place], [place].

## 2011-12-11 NOTE — Assessment & Plan Note (Addendum)
Picture appears to be mechanical fall with subsequent head trauma, concussion. Neuro intact. No somnolence noted.  CT head notes superficial scalp hematoma without acute intracranial process. Recommended that he follow up in 1 week. He is instructed to go to the ER if confusion, worsening HA, visual changes.  Not provided for work.  Case discussed with Dr. Rodena Medin.

## 2011-12-11 NOTE — Progress Notes (Signed)
Subjective:    Patient ID: Willie [name], male    DOB: 08/13/1975, 36 y.o.   MRN: [id number]  HPI  Willie [name] is a 36 yr old male who presents today following an accidental fall in his garage yesterday.   Patient presents today following episode of head trauma. He slipped at 4PM yesterday afternoon.  Does not remember the fall.  He woke up in his bed at 11PM. He has swelling on the back of his head.  No bleeding.   He thinks that he may have vomitted following the fall. He has head soreness today posterior. Had headache this AM- took 2 ibuprofen and 2 tylenol.  Also notes some swelling at base of left anterior neck.  Denies bowel/bladder incontinence or tongue injury. Denies unusual nasal drainage.  What is your name? mycah What is the name of this place? [facility] Why are you here? fall What month are we in? August What year are we in? 2013 In what town/suburb are you in? [place] How old are you? 9 What is your date of birth? 01-20-1976 What time of day is it? (morning, afternoon, evening) Morning    Review of Systems See HPI  Past Medical History  Diagnosis Date  . Alcoholic pancreatitis     recurrent  . Diabetes mellitus, type II     New Onset 03/2010  . Hypertension   . 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 S/P lumbar microdiscectomy 11/2001    Dr Danielle Dess    History   Social History  . Marital Status: Married    Spouse Name: N/A    Number of Children: 2  . 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 Smoking status: Never Smoker   . Smokeless tobacco: Never Used  . Alcohol Use: No  . Drug Use: No  . Sexually Active: Not on file   Other Topics Concern  . 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 Lumbar microdiscectomy     Dr Danielle Dess    Family History  Problem Relation Age of Onset  . Diabetes Mother   . Lung cancer Brother     twin brother  . Colon cancer Neg Hx   . Pancreatic cancer Paternal Aunt     No Known Allergies  Current Outpatient Prescriptions on File Prior to Visit  Medication Sig Dispense Refill  . ALPRAZolam (XANAX) 0.5 MG tablet TAKE 1/2 TABLET BY MOUTH DURING THE DAY AS NEEDED AND 1 AT BEDTIME  45 tablet  0  . atorvastatin (LIPITOR) 10 MG tablet TAKE 1 TABLET BY MOUTH DAILY.  90 tablet  0  . BENICAR HCT 40-25 MG per tablet TAKE 1 TABLET BY MOUTH DAILY.  60 tablet  2  . bisoprolol (ZEBETA) 10 MG tablet TAKE 1 TABLET BY MOUTH AT BEDTIME.  90 tablet  0  . citalopram (CELEXA) 40 MG tablet Take 1 tablet (40 mg total) by mouth daily.  30 tablet  2  . glucose blood (ONE TOUCH TEST STRIPS) test strip 1 each. Use to check blood sugar once day       .  metFORMIN (GLUCOPHAGE) 500 MG tablet Take two tablets in the morning and one tablet in the evening      . metFORMIN (GLUCOPHAGE) 500 MG tablet TAKE 1 & 1/2 TABLETS BY MOUTH TWICE DAILY  90 tablet  2  . multivitamin (ONE-A-DAY MEN'S) TABS Take 1 tablet by mouth daily.        . ONE TOUCH LANCETS MISC by Does not apply route. Use to check blood sugar once a day       . pantoprazole (PROTONIX) 40 MG tablet Take 1 tablet (40 mg total) by mouth daily.  30 tablet  3    BP 120/84  Pulse 105  Temp 97.9 F (36.6 C) (Oral)  Resp 16  Wt 265 lb 0.6 oz (120.221 kg)  SpO2 95%       Objective:   Physical Exam  Constitutional: He is oriented to person, place, and time. He appears well-developed and well-nourished. No distress.  HENT:  Head:         Superficial abrasion right occipital region with mild associated erythema/swelling.   Eyes: Pupils are equal, round, and reactive to light.  Cardiovascular: Normal rate and regular rhythm.     No murmur heard. Pulmonary/Chest: Effort normal and breath sounds normal. No respiratory distress. He has no wheezes. He has no rales. He exhibits no tenderness.  Musculoskeletal:       Mild soft tissue swelling at base of left anterior neck.  No obvious bony abnormality of shoulder or clavicle.   Neurological: He is alert and oriented to person, place, and time. He has normal strength. No cranial nerve deficit. He exhibits normal muscle tone. Coordination and gait normal.  Psychiatric: He has a normal mood and affect. His behavior is normal. Judgment and thought content normal.          Assessment & Plan:

## 2011-12-18 ENCOUNTER — Encounter: Payer: Self-pay | Admitting: Family

## 2011-12-18 ENCOUNTER — Ambulatory Visit (INDEPENDENT_AMBULATORY_CARE_PROVIDER_SITE_OTHER): Payer: 59 | Admitting: Family

## 2011-12-18 VITALS — BP 122/88 | HR 74 | Temp 98.2°F | Resp 16 | Wt 269.5 lb

## 2011-12-18 DIAGNOSIS — G4700 Insomnia, unspecified: Secondary | ICD-10-CM

## 2011-12-18 DIAGNOSIS — S060X9A Concussion with loss of consciousness of unspecified duration, initial encounter: Secondary | ICD-10-CM

## 2011-12-18 DIAGNOSIS — Z23 Encounter for immunization: Secondary | ICD-10-CM

## 2011-12-18 MED ORDER — ALPRAZOLAM 0.5 MG PO TABS: ORAL_TABLET | ORAL | Status: DC

## 2011-12-18 MED ORDER — ZOLPIDEM TARTRATE 5 MG PO TABS: 5.0000 mg | ORAL_TABLET | Freq: Every evening | ORAL | Status: DC | PRN

## 2011-12-18 MED ORDER — TRAMADOL HCL 50 MG PO TABS: 50.0000 mg | ORAL_TABLET | Freq: Three times a day (TID) | ORAL | Status: AC | PRN

## 2011-12-18 NOTE — Patient Instructions (Addendum)
Please call if headaches worsen or if no improvement in headaches in 1-2 weeks.

## 2011-12-18 NOTE — Progress Notes (Signed)
Subjective:    Patient ID: Willie [name], male    DOB: [birth year], 36 y.o.   MRN: [id number]  HPI  [name] is a 36 yr old male who presents today for his 1 week follow up following his concussion.  He continues to have headaches. Yesterday he had mild associated nausea.  Felt better with h2o and sitting in the [facility].  Worked Saturday and Sunday. Had some dizziness and disoriented.  Was having trouble keeping up.  He has not been sleeping well,  Wakes up every 30-45 minutes.  This is new and he attributes this to tenderness on the back of his head.  Often falls asleep sitting in a chair.  He has been using tramadol and 2 tylenol prior to bed.    Review of Systems See HPI  Past Medical History  Diagnosis Date  . Alcoholic pancreatitis     recurrent  . Diabetes mellitus, type II     New Onset 03/2010  . Hypertension   . 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 S/P lumbar microdiscectomy 11/2001    Dr Danielle Dess    History   Social History  . Marital Status: Married    Spouse Name: N/A    Number of Children: 2  . 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 Smoking status: Never Smoker   . Smokeless tobacco: Never Used  . Alcohol Use: No  . Drug Use: No  . Sexually Active: Not on file   Other Topics Concern  . 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 Lumbar microdiscectomy     Dr Danielle Dess    Family History  Problem Relation Age of Onset  . Diabetes Mother   . Lung cancer Brother     twin brother  . Colon cancer Neg Hx   . Pancreatic cancer Paternal Aunt     No Known Allergies  Current Outpatient Prescriptions on File Prior  to Visit  Medication Sig Dispense Refill  . atorvastatin (LIPITOR) 10 MG tablet TAKE 1 TABLET BY MOUTH DAILY.  90 tablet  0  . BENICAR HCT 40-25 MG per tablet TAKE 1 TABLET BY MOUTH DAILY.  60 tablet  2  . bisoprolol (ZEBETA) 10 MG tablet TAKE 1 TABLET BY MOUTH AT BEDTIME.  90 tablet  0  . citalopram (CELEXA) 40 MG tablet Take 1 tablet (40 mg total) by mouth daily.  30 tablet  2  . glucose blood (ONE TOUCH TEST STRIPS) test strip 1 each. Use to check blood sugar once day       . metFORMIN (GLUCOPHAGE) 500 MG tablet Take two tablets in the morning and one tablet in the evening      . metFORMIN (GLUCOPHAGE) 500 MG tablet TAKE 1 & 1/2 TABLETS BY MOUTH TWICE DAILY  90 tablet  2  . multivitamin (ONE-A-DAY MEN'S) TABS Take 1 tablet by mouth daily.        . ONE TOUCH LANCETS MISC by Does not apply route. Use to check blood sugar once a day       . pantoprazole (  PROTONIX) 40 MG tablet Take 1 tablet (40 mg total) by mouth daily.  30 tablet  3  . zolpidem (AMBIEN) 5 MG tablet Take 1 tablet (5 mg total) by mouth at bedtime as needed for sleep.  20 tablet  0    BP 122/88  Pulse 74  Temp 98.2 F (36.8 C) (Oral)  Resp 16  Wt 269 lb 8 oz (122.244 kg)  SpO2 98%       Objective:   Physical Exam  Constitutional: He appears well-developed and well-nourished. He appears lethargic. No distress.  HENT:       R occipital hematoma appears unchanged.   Cardiovascular: Normal rate and regular rhythm.   No murmur heard. Pulmonary/Chest: Effort normal and breath sounds normal. No respiratory distress. He has no wheezes. He has no rales. He exhibits no tenderness.  Neurological: He has normal strength and normal reflexes. He appears lethargic. He displays a negative Romberg sign.          Assessment & Plan:

## 2011-12-20 DIAGNOSIS — G4700 Insomnia, unspecified: Secondary | ICD-10-CM | POA: Insufficient documentation

## 2011-12-20 NOTE — Assessment & Plan Note (Signed)
Exacerbated by painful hematoma on his head.  Plan short course of ambien to be used sparingly.

## 2011-12-20 NOTE — Assessment & Plan Note (Addendum)
He continues to have headaches which I assured him are normal.  It will take him a few weeks before these headaches resolve.  He has requested a note for light duty at work for the next few weeks which I have provided him.

## 2012-01-28 ENCOUNTER — Emergency Department (HOSPITAL_BASED_OUTPATIENT_CLINIC_OR_DEPARTMENT_OTHER): Payer: 59

## 2012-01-28 ENCOUNTER — Emergency Department (HOSPITAL_BASED_OUTPATIENT_CLINIC_OR_DEPARTMENT_OTHER)
Admission: EM | Admit: 2012-01-28 | Discharge: 2012-01-28 | Disposition: A | Discharge status: Home / Self Care | Payer: 59 | Attending: Emergency Medicine | Admitting: Emergency Medicine

## 2012-01-28 ENCOUNTER — Telehealth: Payer: Self-pay | Admitting: Family

## 2012-01-28 ENCOUNTER — Encounter (HOSPITAL_BASED_OUTPATIENT_CLINIC_OR_DEPARTMENT_OTHER): Payer: Self-pay | Admitting: *Deleted

## 2012-01-28 DIAGNOSIS — W19XXXA Unspecified fall, initial encounter: Secondary | ICD-10-CM | POA: Insufficient documentation

## 2012-01-28 DIAGNOSIS — I10 Essential (primary) hypertension: Secondary | ICD-10-CM | POA: Insufficient documentation

## 2012-01-28 DIAGNOSIS — K859 Acute pancreatitis without necrosis or infection, unspecified: Secondary | ICD-10-CM | POA: Insufficient documentation

## 2012-01-28 DIAGNOSIS — R51 Headache: Secondary | ICD-10-CM | POA: Insufficient documentation

## 2012-01-28 DIAGNOSIS — IMO0002 Reserved for concepts with insufficient information to code with codable children: Secondary | ICD-10-CM | POA: Insufficient documentation

## 2012-01-28 DIAGNOSIS — R109 Unspecified abdominal pain: Secondary | ICD-10-CM | POA: Insufficient documentation

## 2012-01-28 DIAGNOSIS — T07XXXA Unspecified multiple injuries, initial encounter: Secondary | ICD-10-CM

## 2012-01-28 DIAGNOSIS — E119 Type 2 diabetes mellitus without complications: Secondary | ICD-10-CM | POA: Insufficient documentation

## 2012-01-28 DIAGNOSIS — K852 Alcohol induced acute pancreatitis without necrosis or infection: Secondary | ICD-10-CM

## 2012-01-28 LAB — CBC WITH DIFFERENTIAL/PLATELET
Basophils Absolute: 0 10*3/uL (ref 0.0–0.1)
Eosinophils Relative: 1 % (ref 0–5)
Lymphocytes Relative: 33 % (ref 12–46)
Lymphs Abs: 3.3 10*3/uL (ref 0.7–4.0)
MCV: 83.4 fL (ref 78.0–100.0)
Neutrophils Relative %: 59 % (ref 43–77)
Platelets: 284 10*3/uL (ref 150–400)
RBC: 4.87 MIL/uL (ref 4.22–5.81)
RDW: 13.7 % (ref 11.5–15.5)
WBC: 10.3 10*3/uL (ref 4.0–10.5)

## 2012-01-28 LAB — COMPREHENSIVE METABOLIC PANEL
ALT: 104 U/L — ABNORMAL HIGH (ref 0–53)
AST: 54 U/L — ABNORMAL HIGH (ref 0–37)
Alkaline Phosphatase: 77 U/L (ref 39–117)
CO2: 30 mEq/L (ref 19–32)
Calcium: 8.8 mg/dL (ref 8.4–10.5)
GFR calc non Af Amer: >90 mL/min (ref >90)
Potassium: 3.3 mEq/L — ABNORMAL LOW (ref 3.5–5.1)
Sodium: 137 mEq/L (ref 135–145)
Total Protein: 7.9 g/dL (ref 6.0–8.3)

## 2012-01-28 LAB — URINALYSIS, ROUTINE W REFLEX MICROSCOPIC
Bilirubin Urine: NEGATIVE
Glucose, UA: NEGATIVE mg/dL
Hgb urine dipstick: NEGATIVE
Specific Gravity, Urine: 1.019 (ref 1.005–1.030)

## 2012-01-28 MED ORDER — FENTANYL CITRATE 0.05 MG/ML IJ SOLN
50.0000 ug | Freq: Once | INTRAMUSCULAR | Status: AC
  Administered 2012-01-28: 50 ug via INTRAVENOUS
  Filled 2012-01-28: qty 2

## 2012-01-28 MED ORDER — GI COCKTAIL ~~LOC~~
30.0000 mL | Freq: Once | ORAL | Status: AC
  Administered 2012-01-28: 30 mL via ORAL
  Filled 2012-01-28: qty 30

## 2012-01-28 MED ORDER — OXYCODONE-ACETAMINOPHEN 5-325 MG PO TABS: 1.0000 | ORAL_TABLET | Freq: Four times a day (QID) | ORAL | Status: DC | PRN

## 2012-01-28 MED ORDER — ONDANSETRON HCL 4 MG/2ML IJ SOLN
4.0000 mg | Freq: Once | INTRAMUSCULAR | Status: AC
  Administered 2012-01-28: 4 mg via INTRAVENOUS
  Filled 2012-01-28: qty 2

## 2012-01-28 MED ORDER — ONDANSETRON 8 MG PO TBDP: ORAL_TABLET | ORAL | Status: DC

## 2012-01-28 MED ORDER — HYDROMORPHONE HCL PF 2 MG/ML IJ SOLN
2.0000 mg | Freq: Once | INTRAMUSCULAR | Status: AC
  Administered 2012-01-28: 2 mg via INTRAVENOUS
  Filled 2012-01-28: qty 1

## 2012-01-28 NOTE — Telephone Encounter (Signed)
We can refill on Friday at his follow up.

## 2012-01-28 NOTE — ED Notes (Signed)
Patient transported to CT 

## 2012-01-28 NOTE — Telephone Encounter (Signed)
Pt reports that he was just seen in the ER for pancreatitis and given a work note for today only. Pt doesn't feel he will be able to return to work tomorrow. Spoke with Provider and advised pt that he can pick up note to be out of work through Friday when he returns for his follow up. Pt stated the ER only gave him enough medications for the next 2 days and he is wanting to know if we will be giving him more?  Please advise.

## 2012-01-28 NOTE — Telephone Encounter (Signed)
Pls call pt to arrange an ED follow up visit this Friday.

## 2012-01-28 NOTE — ED Notes (Signed)
Pt c/o LUQ pain since yesterday that he states feels like his usual pancreatitis flare up. Pt also c/o nausea.

## 2012-01-28 NOTE — Telephone Encounter (Signed)
Letter placed at front desk for pick up and pt has been notified of instructions below.

## 2012-01-28 NOTE — ED Provider Notes (Signed)
History     CSN: [id number]  Arrival date & time 01/28/12  4098   First MD Initiated Contact with Patient 01/28/12 0542      Chief Complaint  Patient presents with  . Fall  . Alcohol Intoxication    (Consider location/radiation/quality/duration/timing/severity/associated sxs/prior treatment) Patient is a 36 y.o. male presenting with fall, intoxication, and abdominal pain. The history is provided by the patient.  Fall The accident occurred 2 days ago. The fall occurred while recreating/playing. He fell from an unknown height. Impact surface: unknown. The volume of blood lost was minimal. The point of impact was the head. The patient is experiencing no pain. There was alcohol use involved in the accident. Associated symptoms include abdominal pain, nausea and vomiting. Pertinent negatives include no visual change. He has tried nothing for the symptoms. The treatment provided no relief.  Alcohol Intoxication Associated symptoms include abdominal pain. Pertinent negatives include no chest pain and no shortness of breath.  Abdominal Pain The primary symptoms of the illness include abdominal pain, nausea and vomiting. The primary symptoms of the illness do not include shortness of breath. The current episode started yesterday. The onset of the illness was gradual. The problem has not changed since onset. The abdominal pain began yesterday. The pain came on gradually. The abdominal pain has been unchanged since its onset. The abdominal pain is located in the LUQ. The abdominal pain does not radiate. The severity of the abdominal pain is 6/10. The abdominal pain is relieved by nothing. The abdominal pain is exacerbated by alcohol use.  The vomiting began yesterday.  The patient has not had a change in bowel habit. Symptoms associated with the illness do not include anorexia, diaphoresis, heartburn, constipation or frequency. Significant associated medical issues include diabetes. Associated medical  issues comments: alcohol abuse and alcoholic pancreatitis.  Patient reports drinking a large amount of vodka on Sunday night.  Is unable to say how much he drank but says he always gets pancreatitis when he drinks to excess.  Moreover, he had an unwitnessed fall while drinking unknown where or what he fell onto but struck face and head  Past Medical History  Diagnosis Date  . Alcoholic pancreatitis     recurrent  . Diabetes mellitus, type II     New Onset 03/2010  . Hypertension   . 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 S/P lumbar microdiscectomy 11/2001    Dr Danielle Dess    Past Surgical History  Procedure Date  . Lumbar microdiscectomy     Dr Danielle Dess    Family History  Problem Relation Age of Onset  . Diabetes Mother   . Lung cancer Brother     twin brother  . Colon cancer Neg Hx   . Pancreatic cancer Paternal Aunt     History  Substance Use Topics  . Smoking status: Never Smoker   . Smokeless tobacco: Never Used  . Alcohol Use: 0.0 oz/week      Review of Systems  Constitutional: Negative for diaphoresis.  Respiratory: Negative for chest tightness and shortness of breath.   Cardiovascular: Negative for chest pain.  Gastrointestinal: Positive for nausea, vomiting and abdominal pain. Negative for heartburn, constipation and anorexia.  Genitourinary: Negative for frequency.  All other systems reviewed and are negative.    Allergies  Review of patient's allergies indicates no known allergies.  Home Medications   Current Outpatient Rx  Name Route Sig  Dispense Refill  . ALPRAZOLAM 0.5 MG PO TABS  TAKE 1/2 TABLET BY MOUTH DURING THE DAY AS NEEDED AND 1 AT BEDTIME 45 tablet 0  . ATORVASTATIN CALCIUM 10 MG PO TABS  TAKE 1 TABLET BY MOUTH DAILY. 90 tablet 0  . BENICAR HCT 40-25 MG PO TABS  TAKE 1 TABLET BY MOUTH DAILY. 60 tablet 2  . BISOPROLOL FUMARATE 10 MG PO TABS  TAKE 1 TABLET BY MOUTH AT BEDTIME. 90 tablet 0  .  CITALOPRAM HYDROBROMIDE 40 MG PO TABS Oral Take 1 tablet (40 mg total) by mouth daily. 30 tablet 2  . GLUCOSE BLOOD VI STRP  1 each. Use to check blood sugar once day     . METFORMIN HCL 500 MG PO TABS  Take two tablets in the morning and one tablet in the evening    . METFORMIN HCL 500 MG PO TABS  TAKE 1 & 1/2 TABLETS BY MOUTH TWICE DAILY 90 tablet 2  . ONE-A-DAY MENS PO TABS Oral Take 1 tablet by mouth daily.      Letta Pate LANCETS MISC Does not apply by Does not apply route. Use to check blood sugar once a day     . PANTOPRAZOLE SODIUM 40 MG PO TBEC Oral Take 1 tablet (40 mg total) by mouth daily. 30 tablet 3  . ZOLPIDEM TARTRATE 5 MG PO TABS Oral Take 1 tablet (5 mg total) by mouth at bedtime as needed for sleep. 20 tablet 0    BP 157/91  Pulse 86  Temp 97.7 F (36.5 C) (Oral)  Resp 18  Wt 270 lb (122.471 kg)  SpO2 99%  Physical Exam  Constitutional: He is oriented to person, place, and time. He appears well-developed and well-nourished. No distress.  HENT:  Head: Normocephalic. Head is without raccoon's eyes.    Right Ear: No mastoid tenderness. No hemotympanum.  Left Ear: No mastoid tenderness. No hemotympanum.  Mouth/Throat: Oropharynx is clear and moist.  Eyes: Pupils are equal, round, and reactive to light.  Neck: Normal range of motion. Neck supple. No tracheal deviation present.  Cardiovascular: Normal rate, regular rhythm and intact distal pulses.   Pulmonary/Chest: Effort normal and breath sounds normal. He has no wheezes. He has no rales.  Abdominal: Soft. Bowel sounds are normal. There is no tenderness. There is no rebound and no guarding.  Musculoskeletal: Normal range of motion.  Neurological: He is alert and oriented to person, place, and time.  Skin: Skin is warm and dry.  Psychiatric: He has a normal mood and affect.    ED Course  Procedures (including critical care time)   Labs Reviewed  CBC WITH DIFFERENTIAL  URINALYSIS, ROUTINE W REFLEX MICROSCOPIC    COMPREHENSIVE METABOLIC PANEL  LIPASE, BLOOD  URINE CULTURE   No results found.   No diagnosis found.    MDM  Patient belching c/w gastritis and his typical pancreatitis.  Given nausea and vomiting in the setting of a fall the patient does not recall because of intoxication will CT head.  Labs c/w pancreatitis.  Has stopped belching and wretching will continue fluid and give dilaudid.  D/c with meds, zofran and clear diet and NO alcohol.  Follow up this week with Dr. Peggyann Juba return for fevers, intractable pain and or vomiting.  Patient verbalizes understanding and agrees to follow up        Mata Rowen Smitty Cords, MD 01/28/12 [phone]

## 2012-01-28 NOTE — Telephone Encounter (Signed)
Patient scheduled appointment for 01/31/12

## 2012-01-29 LAB — URINE CULTURE
Colony Count: NO GROWTH
Culture: NO GROWTH

## 2012-01-31 ENCOUNTER — Encounter: Payer: Self-pay | Admitting: Family

## 2012-01-31 ENCOUNTER — Ambulatory Visit (INDEPENDENT_AMBULATORY_CARE_PROVIDER_SITE_OTHER): Payer: 59 | Admitting: Family

## 2012-01-31 ENCOUNTER — Encounter: Payer: Self-pay | Admitting: *Deleted

## 2012-01-31 VITALS — BP 132/80 | HR 67 | Temp 98.7°F | Resp 16 | Wt 266.1 lb

## 2012-01-31 DIAGNOSIS — H539 Unspecified visual disturbance: Secondary | ICD-10-CM

## 2012-01-31 DIAGNOSIS — F418 Other specified anxiety disorders: Secondary | ICD-10-CM

## 2012-01-31 DIAGNOSIS — F329 Major depressive disorder, single episode, unspecified: Secondary | ICD-10-CM

## 2012-01-31 DIAGNOSIS — Z23 Encounter for immunization: Secondary | ICD-10-CM

## 2012-01-31 DIAGNOSIS — F341 Dysthymic disorder: Secondary | ICD-10-CM

## 2012-01-31 DIAGNOSIS — H547 Unspecified visual loss: Secondary | ICD-10-CM

## 2012-01-31 DIAGNOSIS — E876 Hypokalemia: Secondary | ICD-10-CM

## 2012-01-31 DIAGNOSIS — K859 Acute pancreatitis without necrosis or infection, unspecified: Secondary | ICD-10-CM

## 2012-01-31 LAB — BASIC METABOLIC PANEL WITH GFR
BUN: 7 mg/dL (ref 6–23)
Creat: 1.03 mg/dL (ref 0.50–1.35)
GFR, Est African American: >89 mL/min
GFR, Est Non African American: >89 mL/min
Glucose, Bld: 129 mg/dL — ABNORMAL HIGH (ref 70–99)

## 2012-01-31 LAB — CBC WITH DIFFERENTIAL/PLATELET
Basophils Absolute: 0.1 10*3/uL (ref 0.0–0.1)
Basophils Relative: 1 % (ref 0–1)
Eosinophils Absolute: 0.2 10*3/uL (ref 0.0–0.7)
MCH: 29 pg (ref 26.0–34.0)
MCHC: 34.3 g/dL (ref 30.0–36.0)
Monocytes Absolute: 0.4 10*3/uL (ref 0.1–1.0)
Monocytes Relative: 7 % (ref 3–12)
Neutro Abs: 3.8 10*3/uL (ref 1.7–7.7)
Neutrophils Relative %: 63 % (ref 43–77)
RDW: 14.6 % (ref 11.5–15.5)

## 2012-01-31 LAB — HEPATIC FUNCTION PANEL
ALT: 141 U/L — ABNORMAL HIGH (ref 0–53)
AST: 130 U/L — ABNORMAL HIGH (ref 0–37)
Albumin: 4.5 g/dL (ref 3.5–5.2)
Bilirubin, Direct: 0.2 mg/dL (ref 0.0–0.3)
Total Bilirubin: 0.7 mg/dL (ref 0.3–1.2)

## 2012-01-31 LAB — LIPASE: Lipase: 211 U/L — ABNORMAL HIGH (ref 0–75)

## 2012-01-31 LAB — AMYLASE: Amylase: 74 U/L (ref 0–105)

## 2012-01-31 MED ORDER — DULOXETINE HCL 60 MG PO CPEP: 60.0000 mg | ORAL_CAPSULE | Freq: Every day | ORAL | Status: DC

## 2012-01-31 MED ORDER — FAMOTIDINE 20 MG PO TABS: 20.0000 mg | ORAL_TABLET | Freq: Two times a day (BID) | ORAL | Status: DC

## 2012-01-31 MED ORDER — OXYCODONE-ACETAMINOPHEN 5-325 MG PO TABS: 1.0000 | ORAL_TABLET | Freq: Four times a day (QID) | ORAL | Status: DC | PRN

## 2012-01-31 NOTE — Patient Instructions (Addendum)
You will be contact about your referral to the eye doctor to check your vision.  Please let us know if you have not heard back within 1 week about your referral. Please go to the ER if you develop worsening vision. Cut citalopram in half and take 20mg  once daily for 1 week then stop. Cymbalta- new medication.  Start 30mg  once daily for 1 week then increase to 60mg  once daily on second week. You will overlap the citalopram and cymbalta for 1 week. You will be contact about your referral to Dr. Dellia Cloud (therapist) Please let us know if you have not heard back within 1 week about your referral. Follow up in 1 week. Call if you develop increased abdominal pain, nausea/vomitting or if symptoms do not continue to improve.

## 2012-01-31 NOTE — Progress Notes (Signed)
Subjective:    Patient ID: [place] [name], male    DOB: [birth year], 36 y.o.   MRN: [id number]  HPI  [name] is a 36 yr old male who presents today for ED follow up. ED records are reviewed.  He presented to the ED following a fall 2 days prior. He was apparently intoxicated at the time of the accident.  When he presented to the ED he complained of abdominal pain, nausea and vomitting.  He was noted at that time to be mildly hypokalemic, lft's and amylase were elevated.  Reports that he feel following a Sunday night alcohol binge. Realized that it was the 1 year anniversary of his twin brother's death. Since his visit to the ED he reports that he has been continuing a clear diet.  Last night he developed+ abdominal pain with chicken noodle soup.  Pain is worse with movement. Has been using percocet for pain. Reports that it only helps for 4 hours at at time. He is out of pain medication.   ETOH abuse- reports that he tried to go to AA.  Didn't feel that it was right for him.  Report that there are a lot of people there who are "intense alcoholics."  He feels that he would benefit more from counseling  Review of Systems see HPI    Feel like vision hasn't been as clear since his fall.   Past Medical History  Diagnosis Date  . Alcoholic pancreatitis     recurrent  . Diabetes mellitus, type II     New Onset 03/2010  . Hypertension   . 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 S/P lumbar microdiscectomy 11/2001    Dr [name]    History   Social History  . Marital Status: Married    Spouse Name: N/A    Number of Children: 2  . 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 Smoking status: Never Smoker   . Smokeless tobacco: Never Used  . Alcohol Use: 0.0 oz/week  . Drug Use: No  . Sexually Active: Not on file   Other Topics Concern  . 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 Lumbar microdiscectomy     Dr [name]    Family History  Problem Relation Age of Onset  . Diabetes Mother   . Lung cancer Brother     twin brother  . Colon cancer Neg Hx   . Pancreatic cancer Paternal Aunt     No Known Allergies  Current Outpatient Prescriptions on File Prior to Visit  Medication Sig Dispense Refill  . ALPRAZolam (XANAX) 0.5 MG tablet TAKE 1/2 TABLET BY MOUTH DURING THE DAY AS NEEDED AND 1 AT BEDTIME  45 tablet  0  . atorvastatin (LIPITOR) 10 MG tablet TAKE 1 TABLET BY MOUTH DAILY.  90 tablet  0  . BENICAR HCT 40-25 MG per tablet TAKE 1 TABLET BY MOUTH DAILY.  60 tablet  2  . bisoprolol (ZEBETA) 10 MG tablet TAKE 1 TABLET BY MOUTH AT BEDTIME.  90 tablet  0  . citalopram (CELEXA) 40  MG tablet Take 1 tablet (40 mg total) by mouth daily.  30 tablet  2  . glucose blood (ONE TOUCH TEST STRIPS) test strip 1 each. Use to check blood sugar once day       . metFORMIN (GLUCOPHAGE) 500 MG tablet Take two tablets in the morning and one tablet in the evening      . metFORMIN (GLUCOPHAGE) 500 MG tablet TAKE 1 & 1/2 TABLETS BY MOUTH TWICE DAILY  90 tablet  2  . multivitamin (ONE-A-DAY MEN'S) TABS Take 1 tablet by mouth daily.        . ondansetron (ZOFRAN ODT) 8 MG disintegrating tablet 8mg  ODT q 8 hours prn nausea  12 tablet  0  . ONE TOUCH LANCETS MISC by Does not apply route. Use to check blood sugar once a day       . pantoprazole (PROTONIX) 40 MG tablet Take 1 tablet (40 mg total) by mouth daily.  30 tablet  3  . zolpidem (AMBIEN) 5 MG tablet Take 1 tablet (5 mg total) by mouth at bedtime as needed for sleep.  20 tablet  0  . DULoxetine (CYMBALTA) 60 MG capsule Take 1 capsule (60 mg total) by mouth daily.  30 capsule  0  . famotidine  (PEPCID) 20 MG tablet Take 1 tablet (20 mg total) by mouth 2 (two) times daily.  60 tablet  2    BP 132/80  Pulse 67  Temp 98.7 F (37.1 C) (Oral)  Resp 16  Wt 266 lb 1.3 oz (120.693 kg)  SpO2 99%    Objective:   Physical Exam  Constitutional: He is oriented to person, place, and time. He appears well-developed and well-nourished.  HENT:       Nasal scabbing noted from fall.   Eyes: Left conjunctiva has a hemorrhage.    Cardiovascular: Normal rate and regular rhythm.   No murmur heard. Pulmonary/Chest: Effort normal and breath sounds normal. No respiratory distress. He has no wheezes. He has no rales. He exhibits no tenderness.  Abdominal: Soft. Bowel sounds are decreased. There is no rebound and no guarding.  Musculoskeletal: He exhibits no edema.  Neurological: He is alert and oriented to person, place, and time.  Psychiatric: He has a normal mood and affect. His behavior is normal. Judgment and thought content normal.          Assessment & Plan:

## 2012-02-01 ENCOUNTER — Telehealth: Payer: Self-pay | Admitting: Family

## 2012-02-02 DIAGNOSIS — H547 Unspecified visual loss: Secondary | ICD-10-CM | POA: Insufficient documentation

## 2012-02-02 NOTE — Assessment & Plan Note (Signed)
Clinically improving slowly. Recommended that he continue clear liquid diet for next few days and advance to full liquid diet as tolerated.  Refill percocet for pain. We discussed the importance of complete alcohol avoidance.   

## 2012-02-02 NOTE — Assessment & Plan Note (Deleted)
Clinically improving slowly. Recommended that he continue clear liquid diet for next few days and advance to full liquid diet as tolerated.  Refill percocet for pain. We discussed the importance of complete alcohol avoidance.

## 2012-02-02 NOTE — Assessment & Plan Note (Signed)
Will refer to opthalmology.  Head CT was negative.

## 2012-02-02 NOTE — Assessment & Plan Note (Signed)
Deteriorated in the setting of anniversary of pt's brother's death. Will refer for counseling.

## 2012-02-03 ENCOUNTER — Telehealth: Payer: Self-pay | Admitting: Family

## 2012-02-03 NOTE — Telephone Encounter (Signed)
Message copied by Sandford Craze on Mon Feb 03, 2012 10:54 AM ------      Message from: Eulah Pont      Created: Mon Feb 03, 2012 10:33 AM      Regarding: Referral       Patient is scheduled to  Berniece Andreas,  , [facility]  On Oct  29 @ 11am

## 2012-02-04 MED ORDER — ALPRAZOLAM 0.5 MG PO TABS: ORAL_TABLET | ORAL | Status: DC

## 2012-02-04 NOTE — Telephone Encounter (Signed)
Alprazolam 0.5mg  1/2 during the day as needed and 1 at bedtime, #45 x no refills called to [place] at [facility].

## 2012-02-05 ENCOUNTER — Telehealth: Payer: Self-pay | Admitting: *Deleted

## 2012-02-05 MED ORDER — OXYCODONE-ACETAMINOPHEN 5-325 MG PO TABS: 1.0000 | ORAL_TABLET | Freq: Four times a day (QID) | ORAL | Status: DC | PRN

## 2012-02-05 NOTE — Telephone Encounter (Signed)
Attempted to call rx to pharmacy, rx must be written. Rx printed and fowarded to Provider. Dr Rodena Medin, can you sign Rx in [name]'s absence?

## 2012-02-05 NOTE — Telephone Encounter (Signed)
Pt left message requesting refill of Percocet. He has follow up with on Friday, 02/07/12.  Please advise.

## 2012-02-05 NOTE — Telephone Encounter (Signed)
yes

## 2012-02-05 NOTE — Telephone Encounter (Signed)
OK to send 30 tabs no refills.  

## 2012-02-06 NOTE — Telephone Encounter (Signed)
Left detailed message on cell and to call if any questions. 

## 2012-02-07 ENCOUNTER — Encounter: Payer: Self-pay | Admitting: Family

## 2012-02-07 ENCOUNTER — Ambulatory Visit: Payer: 59 | Admitting: Family

## 2012-02-07 ENCOUNTER — Ambulatory Visit (INDEPENDENT_AMBULATORY_CARE_PROVIDER_SITE_OTHER): Payer: 59 | Admitting: Family

## 2012-02-07 VITALS — BP 110/80 | HR 75 | Temp 97.6°F | Resp 16 | Ht 70.5 in | Wt 265.1 lb

## 2012-02-07 DIAGNOSIS — K859 Acute pancreatitis without necrosis or infection, unspecified: Secondary | ICD-10-CM

## 2012-02-07 DIAGNOSIS — F418 Other specified anxiety disorders: Secondary | ICD-10-CM

## 2012-02-07 DIAGNOSIS — F341 Dysthymic disorder: Secondary | ICD-10-CM

## 2012-02-07 LAB — CBC WITH DIFFERENTIAL/PLATELET
Basophils Absolute: 0 10*3/uL (ref 0.0–0.1)
Basophils Relative: 0 % (ref 0–1)
Eosinophils Absolute: 0.2 10*3/uL (ref 0.0–0.7)
Hemoglobin: 14 g/dL (ref 13.0–17.0)
MCH: 29.2 pg (ref 26.0–34.0)
MCHC: 35 g/dL (ref 30.0–36.0)
Neutro Abs: 5.4 10*3/uL (ref 1.7–7.7)
Neutrophils Relative %: 60 % (ref 43–77)
Platelets: 333 10*3/uL (ref 150–400)
RDW: 14.1 % (ref 11.5–15.5)

## 2012-02-07 NOTE — Assessment & Plan Note (Signed)
Clinically improving. Repeat blood work.  I expect lft's and lipase to be trending downward.  He has already returned to work. I have asked him to try to switch to tylenol as needed for pain relief in place of percocet.

## 2012-02-07 NOTE — Assessment & Plan Note (Signed)
Stable.  He is scheduled to start meeting with a therapist which I think will help him a lot.

## 2012-02-07 NOTE — Progress Notes (Signed)
Subjective:    Patient ID: [place] [name], male    DOB: 03/19/1976, 36 y.o.   MRN: [id number]  HPI  Mr. Hoovler is a 36 yr old male who presents today for follow up of his pancreatitis.    Depression-  Feeling better, keeping busy.  Pancreatitis- Reports that he started eating solids 2 days ago, painful at first.  Eating a bland diet.  Mild post prandial pain.  Some pain in the early AM.  He reports that he has used 2 pain pills today. Denies fever. He reports that he continues to avoid alcohol. Review of Systems See HPI  Past Medical History  Diagnosis Date  . Alcoholic pancreatitis     recurrent  . Diabetes mellitus, type II     New Onset 03/2010  . Hypertension   . 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 S/P lumbar microdiscectomy 11/2001    Dr Danielle Dess    History   Social History  . Marital Status: Married    Spouse Name: N/A    Number of Children: 2  . 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 Smoking status: Never Smoker   . Smokeless tobacco: Never Used  . Alcohol Use: 0.0 oz/week  . Drug Use: No  . Sexually Active: Not on file   Other Topics Concern  . 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 Lumbar microdiscectomy     Dr Danielle Dess    Family History  Problem Relation Age of Onset  . Diabetes Mother   . Lung cancer Brother     twin brother  . Colon cancer Neg Hx   . Pancreatic cancer Paternal Aunt     No Known Allergies  Current Outpatient Prescriptions on File Prior to Visit  Medication Sig Dispense Refill  . ALPRAZolam (XANAX) 0.5 MG tablet TAKE 1/2 TABLET BY MOUTH DURING THE DAY AS NEEDED AND 1 AT BEDTIME   45 tablet  0  . atorvastatin (LIPITOR) 10 MG tablet TAKE 1 TABLET BY MOUTH DAILY.  90 tablet  0  . BENICAR HCT 40-25 MG per tablet TAKE 1 TABLET BY MOUTH DAILY.  60 tablet  2  . bisoprolol (ZEBETA) 10 MG tablet TAKE 1 TABLET BY MOUTH AT BEDTIME.  90 tablet  0  . citalopram (CELEXA) 40 MG tablet Take 1 tablet (40 mg total) by mouth daily.  30 tablet  2  . DULoxetine (CYMBALTA) 60 MG capsule Take 1 capsule (60 mg total) by mouth daily.  30 capsule  0  . famotidine (PEPCID) 20 MG tablet Take 1 tablet (20 mg total) by mouth 2 (two) times daily.  60 tablet  2  . glucose blood (ONE TOUCH TEST STRIPS) test strip 1 each. Use to check blood sugar once day       . metFORMIN (GLUCOPHAGE) 500 MG tablet Take two tablets in the morning and one tablet in the evening      . multivitamin (ONE-A-DAY MEN'S) TABS Take 1 tablet by mouth daily.        . ondansetron (ZOFRAN ODT) 8 MG disintegrating tablet  8mg  ODT q 8 hours prn nausea  12 tablet  0  . ONE TOUCH LANCETS MISC by Does not apply route. Use to check blood sugar once a day       . oxyCODONE-acetaminophen (PERCOCET) 5-325 MG per tablet Take 1 tablet by mouth every 6 (six) hours as needed for pain.  30 tablet  0  . pantoprazole (PROTONIX) 40 MG tablet Take 1 tablet (40 mg total) by mouth daily.  30 tablet  3  . DISCONTD: metFORMIN (GLUCOPHAGE) 500 MG tablet TAKE 1 & 1/2 TABLETS BY MOUTH TWICE DAILY  90 tablet  2  . zolpidem (AMBIEN) 5 MG tablet Take 1 tablet (5 mg total) by mouth at bedtime as needed for sleep.  20 tablet  0    BP 110/80  Pulse 75  Temp 97.6 F (36.4 C) (Oral)  Resp 16  Ht 5' 10.5" (1.791 m)  Wt 265 lb 1.3 oz (120.239 kg)  BMI 37.50 kg/m2  SpO2 98%       Objective:   Physical Exam  Constitutional: He appears well-developed and well-nourished. No distress.  Cardiovascular: Normal rate and regular rhythm.   No murmur heard. Pulmonary/Chest: Effort normal and breath sounds normal. No respiratory distress. He has no wheezes. He has  no rales. He exhibits no tenderness.  Abdominal: Soft. Bowel sounds are normal. He exhibits no distension. There is no tenderness.  Psychiatric: He has a normal mood and affect. His behavior is normal. Judgment and thought content normal.          Assessment & Plan:

## 2012-02-07 NOTE — Patient Instructions (Addendum)
Please complete your blood work prior to leaving.  Follow up in 2 months, call sooner if symptoms do not continue to improve.

## 2012-02-08 LAB — HEPATIC FUNCTION PANEL
Albumin: 4.7 g/dL (ref 3.5–5.2)
Alkaline Phosphatase: 77 U/L (ref 39–117)
Total Bilirubin: 0.6 mg/dL (ref 0.3–1.2)
Total Protein: 7.6 g/dL (ref 6.0–8.3)

## 2012-02-08 LAB — LIPASE: Lipase: 37 U/L (ref 0–75)

## 2012-02-11 ENCOUNTER — Ambulatory Visit: Payer: 59 | Admitting: Family

## 2012-02-24 ENCOUNTER — Other Ambulatory Visit: Payer: Self-pay | Admitting: Family

## 2012-02-25 ENCOUNTER — Ambulatory Visit (INDEPENDENT_AMBULATORY_CARE_PROVIDER_SITE_OTHER): Payer: 59 | Admitting: Licensed Clinical Social Worker

## 2012-02-25 ENCOUNTER — Other Ambulatory Visit: Payer: Self-pay | Admitting: *Deleted

## 2012-02-25 DIAGNOSIS — F321 Major depressive disorder, single episode, moderate: Secondary | ICD-10-CM

## 2012-02-25 MED ORDER — OLMESARTAN MEDOXOMIL-HCTZ 40-25 MG PO TABS: 1.0000 | ORAL_TABLET | Freq: Every day | ORAL | Status: DC

## 2012-02-25 MED ORDER — ATORVASTATIN CALCIUM 10 MG PO TABS: 10.0000 mg | ORAL_TABLET | Freq: Every day | ORAL | Status: DC

## 2012-02-25 NOTE — Telephone Encounter (Signed)
Received call from [facility] at [facility] pharmacy requesting refill of pt's benicar hct. Verbal given for #90 x no refills.

## 2012-02-26 ENCOUNTER — Other Ambulatory Visit: Payer: Self-pay | Admitting: Family

## 2012-03-04 ENCOUNTER — Other Ambulatory Visit: Payer: Self-pay | Admitting: Family

## 2012-03-05 ENCOUNTER — Ambulatory Visit (INDEPENDENT_AMBULATORY_CARE_PROVIDER_SITE_OTHER): Payer: 59 | Admitting: Licensed Clinical Social Worker

## 2012-03-05 DIAGNOSIS — F321 Major depressive disorder, single episode, moderate: Secondary | ICD-10-CM

## 2012-03-19 ENCOUNTER — Ambulatory Visit: Payer: 59 | Admitting: Licensed Clinical Social Worker

## 2012-03-24 ENCOUNTER — Ambulatory Visit (INDEPENDENT_AMBULATORY_CARE_PROVIDER_SITE_OTHER): Payer: 59 | Admitting: Family

## 2012-03-24 ENCOUNTER — Encounter: Payer: Self-pay | Admitting: Family

## 2012-03-24 VITALS — BP 136/100 | HR 69 | Temp 97.8°F | Resp 18 | Ht 70.5 in | Wt 267.0 lb

## 2012-03-24 DIAGNOSIS — Z23 Encounter for immunization: Secondary | ICD-10-CM

## 2012-03-24 DIAGNOSIS — E119 Type 2 diabetes mellitus without complications: Secondary | ICD-10-CM

## 2012-03-24 DIAGNOSIS — E785 Hyperlipidemia, unspecified: Secondary | ICD-10-CM

## 2012-03-24 DIAGNOSIS — I10 Essential (primary) hypertension: Secondary | ICD-10-CM

## 2012-03-24 DIAGNOSIS — F341 Dysthymic disorder: Secondary | ICD-10-CM

## 2012-03-24 DIAGNOSIS — F418 Other specified anxiety disorders: Secondary | ICD-10-CM

## 2012-03-24 LAB — HEMOGLOBIN A1C: Hgb A1c MFr Bld: 7.2 % — ABNORMAL HIGH (ref <5.7)

## 2012-03-24 NOTE — Assessment & Plan Note (Signed)
LDL at goal.  LFTs stable.  Plan repeat flp/lft next visit.

## 2012-03-24 NOTE — Assessment & Plan Note (Signed)
BP Readings from Last 3 Encounters:  03/24/12 136/100  02/07/12 110/80  01/31/12 132/80   DBP up today, pt has not taken meds today. Continue same, monitor.

## 2012-03-24 NOTE — Patient Instructions (Addendum)
Please complete your lab work prior to leaving. Please schedule a follow up appointment in 3 months.  

## 2012-03-24 NOTE — Progress Notes (Signed)
Subjective:    Patient ID: [place] [name], male    DOB: January 12, 1976, 36 y.o.   MRN: [id number]  HPI  Mr.  Willie [name] is a 36 yr old male who presents today for follow up.  1) DM2- He has not been checking sugar.  He continues metformin. Reports Polyuria.  +Polydipsia.  2) Depression/anxiety- Taking cymbalta.  Well controlled.  Denies ETOH.  3) HTN-  Continues benicar-HCT.    4) Hyperlipidemia-  Continues atovastatin.  No myalgia.    Review of Systems See HPI  Past Medical History  Diagnosis Date  . Alcoholic pancreatitis     recurrent  . Diabetes mellitus, type II     New Onset 03/2010  . Hypertension   . 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 S/P lumbar microdiscectomy 11/2001    Dr Willie [name]    History   Social History  . Marital Status: Married    Spouse Name: Willie [name]    Number of Children: 2  . Years of Education: [name]   Occupational History  . 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 Smoking status: Never Smoker   . Smokeless tobacco: Never Used  . Alcohol Use: 0.0 oz/week  . Drug Use: No  . Sexually Active: Not on file   Other Topics Concern  . 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 Lumbar microdiscectomy     Dr [name]    Family History  Problem Relation Age of Onset  . Diabetes [name]   . Lung cancer [name]     twin [place] [name]  . Colon cancer Neg Hx   . Pancreatic cancer [name]     No Known Allergies  Current Outpatient Prescriptions on File Prior to Visit  Medication Sig Dispense Refill  . ALPRAZolam (XANAX) 0.5 MG tablet TAKE 1/2 TABLET BY MOUTH DURING THE DAY AS NEEDED AND 1 AT BEDTIME  45 tablet  0  . atorvastatin (LIPITOR) 10 MG tablet Take 1  tablet (10 mg total) by mouth daily.  90 tablet  0  . bisoprolol (ZEBETA) 10 MG tablet TAKE 1 TABLET BY MOUTH AT BEDTIME.  90 tablet  0  . CYMBALTA 60 MG capsule TAKE 1 CAPSULE (60 MG TOTAL) BY MOUTH DAILY.  30 capsule  0  . famotidine (PEPCID) 20 MG tablet Take 1 tablet (20 mg total) by mouth 2 (two) times daily.  60 tablet  2  . glucose blood (ONE TOUCH TEST STRIPS) test strip 1 each. Use to check blood sugar once day       . metFORMIN (GLUCOPHAGE) 500 MG tablet Take 2 tablets by mouth in the morning and 1 in the evening.  90 tablet  1  . multivitamin (ONE-A-DAY MEN'S) TABS Take 1 tablet by mouth daily.        Marland Kitchen olmesartan-hydrochlorothiazide (BENICAR HCT) 40-25 MG per tablet Take 1 tablet by mouth daily.  90 tablet  0  . ondansetron (ZOFRAN ODT) 8 MG disintegrating tablet 8mg  ODT q 8 hours prn nausea  12 tablet  0  . ONE TOUCH LANCETS MISC by Does not apply route. Use to check blood sugar once a day       .  pantoprazole (PROTONIX) 40 MG tablet Take 1 tablet (40 mg total) by mouth daily.  30 tablet  3    BP 136/100  Pulse 69  Temp 97.8 F (36.6 C) (Oral)  Resp 18  Ht 5' 10.5" (1.791 m)  Wt 267 lb (121.11 kg)  BMI 37.77 kg/m2  SpO2 96%       Objective:   Physical Exam  Constitutional: He appears well-developed and well-nourished. No distress.  Cardiovascular: Normal rate and regular rhythm.   No murmur heard. Pulmonary/Chest: Effort normal and breath sounds normal. No respiratory distress. He has no wheezes. He has no rales. He exhibits no tenderness.  Musculoskeletal: He exhibits no edema.  Skin: Skin is warm and dry. No rash noted. No erythema. No pallor.  Psychiatric: He has a normal mood and affect. His behavior is normal. Judgment and thought content normal.          Assessment & Plan:

## 2012-03-24 NOTE — Assessment & Plan Note (Signed)
Not checking sugars.  Obtain A1C, urine microalbumin.  Pneumovax today. Continue metformin.

## 2012-03-24 NOTE — Assessment & Plan Note (Signed)
Stable on cymbalta, continue same.   

## 2012-03-25 ENCOUNTER — Telehealth: Payer: Self-pay | Admitting: Family

## 2012-03-25 LAB — MICROALBUMIN / CREATININE URINE RATIO
Creatinine, Urine: 257.9 mg/dL
Microalb Creat Ratio: 7.1 mg/g (ref 0.0–30.0)
Microalb, Ur: 1.84 mg/dL (ref 0.00–1.89)

## 2012-03-25 MED ORDER — METFORMIN HCL 500 MG PO TABS: 1000.0000 mg | ORAL_TABLET | Freq: Two times a day (BID) | ORAL | Status: DC

## 2012-03-25 NOTE — Telephone Encounter (Signed)
Pls let pt know that his diabetes is slightly above goal.  I would like him to increase metformin to 1000mg  (2 tabs) bid.  Previously he was taking 2 in am and 1 in pm.

## 2012-03-25 NOTE — Telephone Encounter (Signed)
Notified pt and refill sent to pharmacy.

## 2012-03-27 ENCOUNTER — Other Ambulatory Visit: Payer: Self-pay | Admitting: Family

## 2012-03-27 NOTE — Telephone Encounter (Signed)
45

## 2012-03-27 NOTE — Telephone Encounter (Signed)
Alprazolam request [Last Rx 10.08.13 #45x0]/SLS Please advise.

## 2012-03-27 NOTE — Telephone Encounter (Signed)
Rx phone to pharmacy/SLS 

## 2012-04-07 ENCOUNTER — Other Ambulatory Visit: Payer: Self-pay | Admitting: Family

## 2012-04-07 ENCOUNTER — Ambulatory Visit: Payer: 59 | Admitting: Family

## 2012-04-09 ENCOUNTER — Ambulatory Visit (INDEPENDENT_AMBULATORY_CARE_PROVIDER_SITE_OTHER): Payer: 59 | Admitting: Licensed Clinical Social Worker

## 2012-04-09 DIAGNOSIS — F321 Major depressive disorder, single episode, moderate: Secondary | ICD-10-CM

## 2012-04-14 ENCOUNTER — Telehealth: Payer: Self-pay | Admitting: Family

## 2012-04-14 ENCOUNTER — Encounter (HOSPITAL_BASED_OUTPATIENT_CLINIC_OR_DEPARTMENT_OTHER): Payer: Self-pay | Admitting: *Deleted

## 2012-04-14 ENCOUNTER — Emergency Department (HOSPITAL_BASED_OUTPATIENT_CLINIC_OR_DEPARTMENT_OTHER)
Admission: EM | Admit: 2012-04-14 | Discharge: 2012-04-14 | Disposition: A | Discharge status: Home / Self Care | Payer: 59 | Attending: Emergency Medicine | Admitting: Emergency Medicine

## 2012-04-14 DIAGNOSIS — Z79899 Other long term (current) drug therapy: Secondary | ICD-10-CM | POA: Insufficient documentation

## 2012-04-14 DIAGNOSIS — K861 Other chronic pancreatitis: Secondary | ICD-10-CM | POA: Insufficient documentation

## 2012-04-14 DIAGNOSIS — R7309 Other abnormal glucose: Secondary | ICD-10-CM | POA: Insufficient documentation

## 2012-04-14 DIAGNOSIS — E119 Type 2 diabetes mellitus without complications: Secondary | ICD-10-CM | POA: Insufficient documentation

## 2012-04-14 DIAGNOSIS — R739 Hyperglycemia, unspecified: Secondary | ICD-10-CM

## 2012-04-14 DIAGNOSIS — Z8739 Personal history of other diseases of the musculoskeletal system and connective tissue: Secondary | ICD-10-CM | POA: Insufficient documentation

## 2012-04-14 DIAGNOSIS — R0789 Other chest pain: Secondary | ICD-10-CM

## 2012-04-14 DIAGNOSIS — R071 Chest pain on breathing: Secondary | ICD-10-CM | POA: Insufficient documentation

## 2012-04-14 DIAGNOSIS — I10 Essential (primary) hypertension: Secondary | ICD-10-CM | POA: Insufficient documentation

## 2012-04-14 LAB — CBC WITH DIFFERENTIAL/PLATELET
Basophils Relative: 0 % (ref 0–1)
Eosinophils Absolute: 0.2 10*3/uL (ref 0.0–0.7)
Eosinophils Relative: 2 % (ref 0–5)
Lymphs Abs: 3.3 10*3/uL (ref 0.7–4.0)
MCH: 29.4 pg (ref 26.0–34.0)
MCHC: 36.2 g/dL — ABNORMAL HIGH (ref 30.0–36.0)
MCV: 81.1 fL (ref 78.0–100.0)
Monocytes Relative: 8 % (ref 3–12)
Platelets: 301 10*3/uL (ref 150–400)
RBC: 4.66 MIL/uL (ref 4.22–5.81)

## 2012-04-14 LAB — BASIC METABOLIC PANEL
BUN: 10 mg/dL (ref 6–23)
CO2: 27 mEq/L (ref 19–32)
Calcium: 9.6 mg/dL (ref 8.4–10.5)
Glucose, Bld: 188 mg/dL — ABNORMAL HIGH (ref 70–99)
Sodium: 138 mEq/L (ref 135–145)

## 2012-04-14 MED ORDER — ASPIRIN 81 MG PO CHEW
324.0000 mg | CHEWABLE_TABLET | Freq: Once | ORAL | Status: AC
  Administered 2012-04-14: 324 mg via ORAL
  Filled 2012-04-14: qty 4

## 2012-04-14 MED ORDER — HYDROCODONE-ACETAMINOPHEN 5-325 MG PO TABS
2.0000 | ORAL_TABLET | Freq: Once | ORAL | Status: AC
  Administered 2012-04-14: 2 via ORAL
  Filled 2012-04-14: qty 2

## 2012-04-14 MED ORDER — HYDROCODONE-ACETAMINOPHEN 5-325 MG PO TABS: 2.0000 | ORAL_TABLET | ORAL | Status: DC | PRN

## 2012-04-14 NOTE — Telephone Encounter (Signed)
Pls call pt to arrange a 1 week ED follow up.

## 2012-04-14 NOTE — ED Notes (Signed)
Rx x 1 given for hydrocodone- pt has a ride

## 2012-04-14 NOTE — ED Notes (Signed)
Pt c/o chest pain while driving at 6 pm with "tingling and numbness to both arms and fingers

## 2012-04-14 NOTE — ED Provider Notes (Signed)
History     CSN: [id number]  Arrival date & time 04/14/12  0021   First MD Initiated Contact with Patient 04/14/12 0107      Chief Complaint  Patient presents with  . Chest Pain    (Consider location/radiation/quality/duration/timing/severity/associated sxs/prior treatment) HPI 36 year old male presents to emergency room complaining of intermittent left upper chest pain for last week. He has noticed symptoms more over the last 2-3 days, worse today with pins and needle sensation in both hands left greater than right. He denies any shortness of breath, nausea, no diaphoresis. Pain is worse with movement, sitting up or using his left arm. He denies any known trauma or injury to the area. He has had similar pain in the past, but usually resolves on its own. He is taking no medicine for it, no heat or ice.  Patient became concerned this evening with the tingling and persistent pain. He has history of diabetes, hypertension. He reports his father had heart trouble early on, and died in his 12s. Pain is not worse with exertion. He has no pain when sitting still.  Past Medical History  Diagnosis Date  . Alcoholic pancreatitis     recurrent  . Diabetes mellitus, type II     New Onset 03/2010  . Hypertension   . 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 S/P lumbar microdiscectomy 11/2001    Dr Danielle Dess    Past Surgical History  Procedure Date  . Lumbar microdiscectomy     Dr Danielle Dess    Family History  Problem Relation Age of Onset  . Diabetes Mother   . Lung cancer Brother     twin brother  . Colon cancer Neg Hx   . Pancreatic cancer Paternal Aunt     History  Substance Use Topics  . Smoking status: Never Smoker   . Smokeless tobacco: Never Used  . Alcohol Use: 0.0 oz/week      Review of Systems  All other systems reviewed and are negative.   other than listed in history of present illness    Allergies  Review of  patient's allergies indicates no known allergies.  Home Medications   Current Outpatient Rx  Name  Route  Sig  Dispense  Refill  . ALPRAZOLAM 0.5 MG PO TABS      TAKE 1/2 TABLET BY MOUTH DURING THE DAY AS NEEDED AND 1 AT BEDTIME   45 tablet   0     PATIENT IS OUT OF MEDICATION   . ATORVASTATIN CALCIUM 10 MG PO TABS   Oral   Take 1 tablet (10 mg total) by mouth daily.   90 tablet   0   . BISOPROLOL FUMARATE 10 MG PO TABS      TAKE 1 TABLET BY MOUTH AT BEDTIME.   90 tablet   0   . CYMBALTA 60 MG PO CPEP      TAKE 1 CAPSULE BY MOUTH DAILY   30 capsule   2   . FAMOTIDINE 20 MG PO TABS   Oral   Take 1 tablet (20 mg total) by mouth 2 (two) times daily.   60 tablet   2   . GLUCOSE BLOOD VI STRP      1 each. Use to check blood sugar once day          . METFORMIN HCL 500 MG PO TABS   Oral   Take 2  tablets (1,000 mg total) by mouth 2 (two) times daily with a meal.   120 tablet   3   . ONE-A-DAY MENS PO TABS   Oral   Take 1 tablet by mouth daily.           Marland Kitchen OLMESARTAN MEDOXOMIL-HCTZ 40-25 MG PO TABS   Oral   Take 1 tablet by mouth daily.   90 tablet   0   . ONDANSETRON 8 MG PO TBDP      8mg  ODT q 8 hours prn nausea   12 tablet   0   . ONETOUCH LANCETS MISC   Does not apply   by Does not apply route. Use to check blood sugar once a day          . PANTOPRAZOLE SODIUM 40 MG PO TBEC      TAKE 1 TABLET (40 MG TOTAL) BY MOUTH DAILY.   30 tablet   3     BP 144/98  Pulse 82  Temp 98 F (36.7 C) (Oral)  Resp 16  Ht 5\' 11"  (1.803 m)  Wt 264 lb (119.75 kg)  BMI 36.82 kg/m2  SpO2 100%  Physical Exam  Nursing note and vitals reviewed. Constitutional: He is oriented to person, place, and time. He appears well-developed and well-nourished.       Obese male in no acute distress  HENT:  Head: Normocephalic and atraumatic.  Nose: Nose normal.  Mouth/Throat: Oropharynx is clear and moist.  Eyes: Conjunctivae normal and EOM are normal. Pupils are  equal, round, and reactive to light.  Neck: Normal range of motion. Neck supple. No JVD present. No tracheal deviation present. No thyromegaly present.       Axial loading of the head does not recreate pins and needle sensation in the arms no tenderness to posterior cervical spine or paraspinal muscles. No spasm noted  Cardiovascular: Normal rate, regular rhythm, normal heart sounds and intact distal pulses.  Exam reveals no gallop and no friction rub.   No murmur heard. Pulmonary/Chest: Effort normal and breath sounds normal. No stridor. No respiratory distress. He has no wheezes. He has no rales. He exhibits tenderness (patient with significant tenderness to palpation of his left upper pectoral muscle. Palpation of this area reproduces pain completely. Movement of the left arm above his head also reproduces the pain.).  Abdominal: Soft. Bowel sounds are normal. He exhibits no distension and no mass. There is no tenderness. There is no rebound and no guarding.  Musculoskeletal: Normal range of motion. He exhibits no edema and no tenderness.  Lymphadenopathy:    He has no cervical adenopathy.  Neurological: He is alert and oriented to person, place, and time. He has normal reflexes. No cranial nerve deficit. He exhibits normal muscle tone. Coordination normal.  Skin: Skin is warm and dry. No rash noted. No erythema. No pallor.  Psychiatric: He has a normal mood and affect. His behavior is normal. Judgment and thought content normal.    ED Course  Procedures (including critical care time)  Labs Reviewed  BASIC METABOLIC PANEL - Abnormal; Notable for the following:    Potassium 3.2 (*)     Glucose, Bld 188 (*)     All other components within normal limits  CBC WITH DIFFERENTIAL - Abnormal; Notable for the following:    HCT 37.8 (*)     MCHC 36.2 (*)     All other components within normal limits  TROPONIN I   No results found.  Date: 04/14/2012  Rate: 85  Rhythm: normal sinus rhythm   QRS Axis: normal  Intervals: normal  ST/T Wave abnormalities: nonspecific T wave changes  Conduction Disutrbances:none  Narrative Interpretation: Lateral T-wave flattening new from prior EKG of 2011  Old EKG Reviewed: changes noted    1. Left-sided chest wall pain   2. Hyperglycemia without ketosis   3. Diabetes       MDM  36 year old male with left chest wall pain. Patient does have significant cardiac risk factors, however pain is completely reproducible with palpation and movement of the left pectoral muscle. Will treat with Vicodin. Given his history, we'll check baseline labs. Warfarin back to his PCP for further workup should he have persistent symptoms. With the tingling in his upper extremities, there is a possibility that he may have a cervical spinal cord abn causing symptoms.  Should sxs continue, he may need MRI for further evaluation.          Olivia Mackie, MD 04/14/12 [phone]

## 2012-04-14 NOTE — ED Notes (Signed)
MD at bedside. 

## 2012-04-14 NOTE — Telephone Encounter (Signed)
Left detailed message for patient to return my call.  °

## 2012-04-15 ENCOUNTER — Ambulatory Visit (INDEPENDENT_AMBULATORY_CARE_PROVIDER_SITE_OTHER): Payer: 59 | Admitting: Family

## 2012-04-15 ENCOUNTER — Other Ambulatory Visit: Payer: Self-pay | Admitting: Family

## 2012-04-15 ENCOUNTER — Encounter: Payer: Self-pay | Admitting: Family

## 2012-04-15 ENCOUNTER — Ambulatory Visit (HOSPITAL_BASED_OUTPATIENT_CLINIC_OR_DEPARTMENT_OTHER)
Admission: RE | Admit: 2012-04-15 | Discharge: 2012-04-15 | Disposition: A | Discharge status: Home / Self Care | Payer: 59 | Source: Ambulatory Visit | Attending: Family | Admitting: Family

## 2012-04-15 VITALS — BP 126/90 | HR 78 | Temp 98.4°F | Resp 16

## 2012-04-15 DIAGNOSIS — R209 Unspecified disturbances of skin sensation: Secondary | ICD-10-CM | POA: Insufficient documentation

## 2012-04-15 DIAGNOSIS — M542 Cervicalgia: Secondary | ICD-10-CM | POA: Insufficient documentation

## 2012-04-15 MED ORDER — MELOXICAM 7.5 MG PO TABS: 7.5000 mg | ORAL_TABLET | Freq: Every day | ORAL | Status: DC

## 2012-04-15 NOTE — Assessment & Plan Note (Signed)
?   Mild cervical radiculopathy.  Short course of aleve. Obtain plain film of the c spine.

## 2012-04-15 NOTE — Patient Instructions (Addendum)
Complete your x ray on the first floor. Please call if you develop numbness/weakness in her hands. Follow up in early March, sooner if problems/concerns.

## 2012-04-15 NOTE — Progress Notes (Signed)
Subjective:    Patient ID: Willie [name], male    DOB: July 12, 1975, 36 y.o.   MRN: [id number]  HPI  Willie [name] is a 36 yr old male who presents today for ED follow up. He was seen in the ED yesterday due to left sided musculoskeletal pain. Records are reviewed. Work up revealed normal tryponin.  Mild hypokalemia was incidentally noted.  He was prescribed vicodin for pain.  He reports that prior to his ED visit he was having chest discomfort x 1 week with intermittent hand numbness.  He took vicodin today.  Now having some neck pain and some left anterior chest wall pain.      Review of Systems See HPI  Past Medical History  Diagnosis Date  . Alcoholic pancreatitis     recurrent  . Diabetes mellitus, type II     New Onset 03/2010  . Hypertension   . 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 S/P lumbar microdiscectomy 11/2001    Dr Danielle Dess    History   Social History  . Marital Status: Married    Spouse Name: N/A    Number of Children: 2  . 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 Smoking status: Never Smoker   . Smokeless tobacco: Never Used  . Alcohol Use: 0.0 oz/week  . Drug Use: No  . Sexually Active: Not on file   Other Topics Concern  . 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 Lumbar microdiscectomy     Dr Danielle Dess    Family History  Problem Relation Age of Onset  . Diabetes Mother   . Lung cancer Brother     twin brother  . Colon cancer Neg Hx   . Pancreatic cancer Paternal Aunt     No Known Allergies  Current Outpatient Prescriptions on File Prior to Visit  Medication Sig Dispense Refill  . ALPRAZolam  (XANAX) 0.5 MG tablet TAKE 1/2 TABLET BY MOUTH DURING THE DAY AS NEEDED AND 1 AT BEDTIME  45 tablet  0  . atorvastatin (LIPITOR) 10 MG tablet Take 1 tablet (10 mg total) by mouth daily.  90 tablet  0  . bisoprolol (ZEBETA) 10 MG tablet TAKE 1 TABLET BY MOUTH AT BEDTIME.  90 tablet  0  . CYMBALTA 60 MG capsule TAKE 1 CAPSULE BY MOUTH DAILY  30 capsule  2  . famotidine (PEPCID) 20 MG tablet Take 1 tablet (20 mg total) by mouth 2 (two) times daily.  60 tablet  2  . glucose blood (ONE TOUCH TEST STRIPS) test strip 1 each. Use to check blood sugar once day       . HYDROcodone-acetaminophen (NORCO/VICODIN) 5-325 MG per tablet Take 2 tablets by mouth every 4 (four) hours as needed for pain.  6 tablet  0  . metFORMIN (GLUCOPHAGE) 500 MG tablet Take 2 tablets (1,000 mg total) by mouth 2 (two) times daily with a meal.  120 tablet  3  . multivitamin (ONE-A-DAY MEN'S) TABS Take 1 tablet by mouth daily.        Marland Kitchen olmesartan-hydrochlorothiazide (BENICAR HCT)  40-25 MG per tablet Take 1 tablet by mouth daily.  90 tablet  0  . ondansetron (ZOFRAN ODT) 8 MG disintegrating tablet 8mg  ODT q 8 hours prn nausea  12 tablet  0  . ONE TOUCH LANCETS MISC by Does not apply route. Use to check blood sugar once a day       . pantoprazole (PROTONIX) 40 MG tablet TAKE 1 TABLET (40 MG TOTAL) BY MOUTH DAILY.  30 tablet  3    BP 126/90  Pulse 78  Temp 98.4 F (36.9 C) (Oral)  Resp 16  SpO2 97%       Objective:   Physical Exam  Constitutional: He appears well-developed and well-nourished. No distress.  HENT:  Head: Normocephalic and atraumatic.  Cardiovascular: Normal rate and regular rhythm.   No murmur heard. Pulmonary/Chest: Effort normal and breath sounds normal. No respiratory distress. He has no wheezes. He has no rales. He exhibits no tenderness.  Musculoskeletal: He exhibits no edema.       Left anterior chest wall tenderness to palpation  Neurological: He is alert.       Strong equal hand grasps Bilateral  UE strength is 5/5 Neg Phalans, Neg Tinnels  Skin: Skin is warm.  Psychiatric: He has a normal mood and affect. His behavior is normal. Judgment and thought content normal.          Assessment & Plan:

## 2012-04-15 NOTE — Telephone Encounter (Signed)
Patient had an appointment this morning.

## 2012-04-30 ENCOUNTER — Ambulatory Visit: Payer: 59 | Admitting: Licensed Clinical Social Worker

## 2012-05-08 ENCOUNTER — Telehealth: Payer: Self-pay | Admitting: Family

## 2012-05-08 MED ORDER — ALPRAZOLAM 0.5 MG PO TABS: ORAL_TABLET | ORAL | Status: DC

## 2012-05-08 NOTE — Telephone Encounter (Signed)
Refill called to [place] at [facility].

## 2012-05-08 NOTE — Telephone Encounter (Signed)
Refill-alprazolam 0.5mg  tablet. Take 1/2 tablet by mouth during the day and 1 tablet by mouth at night as needed. Qty 30 last fill 11.29.13

## 2012-05-18 ENCOUNTER — Encounter (HOSPITAL_BASED_OUTPATIENT_CLINIC_OR_DEPARTMENT_OTHER): Payer: Self-pay | Admitting: *Deleted

## 2012-05-18 ENCOUNTER — Emergency Department (HOSPITAL_BASED_OUTPATIENT_CLINIC_OR_DEPARTMENT_OTHER): Payer: 59

## 2012-05-18 ENCOUNTER — Emergency Department (HOSPITAL_BASED_OUTPATIENT_CLINIC_OR_DEPARTMENT_OTHER)
Admission: EM | Admit: 2012-05-18 | Discharge: 2012-05-18 | Disposition: A | Discharge status: Home / Self Care | Payer: 59 | Attending: Emergency Medicine | Admitting: Emergency Medicine

## 2012-05-18 ENCOUNTER — Emergency Department (HOSPITAL_BASED_OUTPATIENT_CLINIC_OR_DEPARTMENT_OTHER)
Admission: EM | Admit: 2012-05-18 | Discharge: 2012-05-19 | Disposition: A | Discharge status: Home / Self Care | Payer: 59 | Attending: Emergency Medicine | Admitting: Emergency Medicine

## 2012-05-18 ENCOUNTER — Telehealth: Payer: Self-pay | Admitting: Family

## 2012-05-18 DIAGNOSIS — K859 Acute pancreatitis without necrosis or infection, unspecified: Secondary | ICD-10-CM

## 2012-05-18 DIAGNOSIS — Z79899 Other long term (current) drug therapy: Secondary | ICD-10-CM | POA: Insufficient documentation

## 2012-05-18 DIAGNOSIS — E119 Type 2 diabetes mellitus without complications: Secondary | ICD-10-CM | POA: Insufficient documentation

## 2012-05-18 DIAGNOSIS — I10 Essential (primary) hypertension: Secondary | ICD-10-CM | POA: Insufficient documentation

## 2012-05-18 DIAGNOSIS — Z8739 Personal history of other diseases of the musculoskeletal system and connective tissue: Secondary | ICD-10-CM | POA: Insufficient documentation

## 2012-05-18 DIAGNOSIS — K861 Other chronic pancreatitis: Secondary | ICD-10-CM | POA: Insufficient documentation

## 2012-05-18 LAB — COMPREHENSIVE METABOLIC PANEL
ALT: 138 U/L — ABNORMAL HIGH (ref 0–53)
Albumin: 4.5 g/dL (ref 3.5–5.2)
Alkaline Phosphatase: 85 U/L (ref 39–117)
Calcium: 10.1 mg/dL (ref 8.4–10.5)
GFR calc Af Amer: >90 mL/min (ref >90)
Glucose, Bld: 137 mg/dL — ABNORMAL HIGH (ref 70–99)
Potassium: 3.6 mEq/L (ref 3.5–5.1)
Sodium: 137 mEq/L (ref 135–145)
Total Protein: 7.8 g/dL (ref 6.0–8.3)

## 2012-05-18 LAB — CBC
MCH: 29.6 pg (ref 26.0–34.0)
MCHC: 36 g/dL (ref 30.0–36.0)
Platelets: 296 10*3/uL (ref 150–400)
RDW: 13.6 % (ref 11.5–15.5)

## 2012-05-18 LAB — URINALYSIS, ROUTINE W REFLEX MICROSCOPIC
Glucose, UA: NEGATIVE mg/dL
Leukocytes, UA: NEGATIVE
Nitrite: NEGATIVE
Protein, ur: NEGATIVE mg/dL
Urobilinogen, UA: 0.2 mg/dL (ref 0.0–1.0)

## 2012-05-18 LAB — LIPASE, BLOOD: Lipase: 778 U/L — ABNORMAL HIGH (ref 11–59)

## 2012-05-18 MED ORDER — ONDANSETRON HCL 4 MG PO TABS: 4.0000 mg | ORAL_TABLET | Freq: Four times a day (QID) | ORAL | Status: DC

## 2012-05-18 MED ORDER — SODIUM CHLORIDE 0.9 % IV BOLUS (SEPSIS)
1000.0000 mL | Freq: Once | INTRAVENOUS | Status: AC
  Administered 2012-05-18: 1000 mL via INTRAVENOUS

## 2012-05-18 MED ORDER — ONDANSETRON HCL 4 MG/2ML IJ SOLN
4.0000 mg | Freq: Once | INTRAMUSCULAR | Status: AC
  Administered 2012-05-18: 4 mg via INTRAVENOUS
  Filled 2012-05-18: qty 2

## 2012-05-18 MED ORDER — OXYCODONE HCL 5 MG PO TABS: 5.0000 mg | ORAL_TABLET | ORAL | Status: DC | PRN

## 2012-05-18 MED ORDER — HYDROMORPHONE HCL PF 1 MG/ML IJ SOLN
1.0000 mg | Freq: Once | INTRAMUSCULAR | Status: AC
  Administered 2012-05-18: 1 mg via INTRAVENOUS
  Filled 2012-05-18: qty 1

## 2012-05-18 MED ORDER — IOHEXOL 300 MG/ML  SOLN
20.0000 mL | INTRAMUSCULAR | Status: AC
  Administered 2012-05-18: 20 mL via ORAL

## 2012-05-18 MED ORDER — OXYCODONE-ACETAMINOPHEN 5-325 MG PO TABS
1.0000 | ORAL_TABLET | Freq: Once | ORAL | Status: AC
  Administered 2012-05-18: 1 via ORAL
  Filled 2012-05-18 (×2): qty 1

## 2012-05-18 MED ORDER — HYDROMORPHONE HCL PF 2 MG/ML IJ SOLN
2.0000 mg | Freq: Once | INTRAMUSCULAR | Status: AC
  Administered 2012-05-18: 2 mg via INTRAMUSCULAR
  Filled 2012-05-18: qty 1

## 2012-05-18 MED ORDER — OXYCODONE-ACETAMINOPHEN 5-325 MG PO TABS: 1.0000 | ORAL_TABLET | Freq: Four times a day (QID) | ORAL | Status: DC | PRN

## 2012-05-18 MED ORDER — IOHEXOL 300 MG/ML  SOLN
100.0000 mL | Freq: Once | INTRAMUSCULAR | Status: AC | PRN
  Administered 2012-05-18: 100 mL via INTRAVENOUS

## 2012-05-18 NOTE — ED Notes (Signed)
C/o mid upper abd pain that started 4 hours ago. States hx of pancreatitis. Denies any n/v or fevers. Denies diarrhea. Describes pain as sharp and constant. States last etoh was yesterday.

## 2012-05-18 NOTE — Telephone Encounter (Signed)
Left detailed message on cell and to call if any questions. 

## 2012-05-18 NOTE — ED Notes (Addendum)
Abdominal pain since yesterday. States pain is no better. Last pain medication was at 6:30pm. He states he is still in pain. Pt drove himself here.

## 2012-05-18 NOTE — ED Notes (Signed)
Pt. Reports he has a follow up  Appt. Next Wednesday.  Pt. Reports he was binge drinking approx. 3 days ago.

## 2012-05-18 NOTE — ED Notes (Signed)
MD at bedside. 

## 2012-05-18 NOTE — Telephone Encounter (Signed)
Patient was seen last night at the ED and scheduled an appointment for 05/27/12. He states that he will be out of pain medication before then and would like to know if Efraim Kaufmann would call him in more. [facility]

## 2012-05-18 NOTE — ED Notes (Signed)
Pt. Reports he has been drinking water.  Pt. Reports he is unable to control his pain with the pain meds perscribed for him.

## 2012-05-18 NOTE — ED Provider Notes (Signed)
History     CSN: [id number]  Arrival date & time 05/18/12  0308   First MD Initiated Contact with Patient 05/18/12 [phone]      Chief Complaint  Patient presents with  . Abdominal Pain    (Consider location/radiation/quality/duration/timing/severity/associated sxs/prior treatment) HPI Pt presents with c/o epigastric abdominal pain.  Pain began approx 4 hours prior to arrival.  He states he has hx of pancreatitis and this feels very similar.  No vomiting.  No fever/chills.  Denies dysuria.  States etoh usually causes flare of pancreatitis- had several drinks approx 24 hours ago.  No change in stools. Pain is constant and sharp.  Has not had any treatment prior to arrival.  There are no other associated systemic symptoms, there are no other alleviating or modifying factors.   Past Medical History  Diagnosis Date  . Alcoholic pancreatitis     recurrent  . Diabetes mellitus, type II     New Onset 03/2010  . Hypertension   . 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 S/P lumbar microdiscectomy 11/2001    Dr Danielle Dess    Past Surgical History  Procedure Date  . Lumbar microdiscectomy     Dr Danielle Dess    Family History  Problem Relation Age of Onset  . Diabetes Mother   . Lung cancer Brother     twin brother  . Colon cancer Neg Hx   . Pancreatic cancer Paternal Aunt     History  Substance Use Topics  . Smoking status: Never Smoker   . Smokeless tobacco: Never Used  . Alcohol Use: 0.0 oz/week      Review of Systems ROS reviewed and all otherwise negative except for mentioned in HPI  Allergies  Review of patient's allergies indicates no known allergies.  Home Medications   Current Outpatient Rx  Name  Route  Sig  Dispense  Refill  . ALPRAZOLAM 0.5 MG PO TABS      Take 1/2 tablet by mouth during the day as needed and 1 at bedtime.   45 tablet   0     PATIENT IS OUT OF MEDICATION   . ATORVASTATIN CALCIUM 10 MG PO  TABS   Oral   Take 1 tablet (10 mg total) by mouth daily.   90 tablet   0   . BISOPROLOL FUMARATE 10 MG PO TABS      TAKE 1 TABLET BY MOUTH AT BEDTIME.   90 tablet   0   . CYMBALTA 60 MG PO CPEP      TAKE 1 CAPSULE BY MOUTH DAILY   30 capsule   2   . FAMOTIDINE 20 MG PO TABS   Oral   Take 1 tablet (20 mg total) by mouth 2 (two) times daily.   60 tablet   2   . GLUCOSE BLOOD VI STRP      1 each. Use to check blood sugar once day          . HYDROCODONE-ACETAMINOPHEN 5-325 MG PO TABS   Oral   Take 2 tablets by mouth every 4 (four) hours as needed for pain.   6 tablet   0   . MELOXICAM 7.5 MG PO TABS   Oral   Take 1 tablet (7.5 mg total) by mouth daily.   7 tablet   0   . METFORMIN HCL 500 MG PO TABS   Oral   Take  2 tablets (1,000 mg total) by mouth 2 (two) times daily with a meal.   120 tablet   3   . ONE-A-DAY MENS PO TABS   Oral   Take 1 tablet by mouth daily.           Marland Kitchen OLMESARTAN MEDOXOMIL-HCTZ 40-25 MG PO TABS   Oral   Take 1 tablet by mouth daily.   90 tablet   0   . ONDANSETRON 8 MG PO TBDP      8mg  ODT q 8 hours prn nausea   12 tablet   0   . ONDANSETRON HCL 4 MG PO TABS   Oral   Take 1 tablet (4 mg total) by mouth every 6 (six) hours.   12 tablet   0   . ONETOUCH LANCETS MISC   Does not apply   by Does not apply route. Use to check blood sugar once a day          . OXYCODONE-ACETAMINOPHEN 5-325 MG PO TABS   Oral   Take 1-2 tablets by mouth every 6 (six) hours as needed for pain.   15 tablet   0   . PANTOPRAZOLE SODIUM 40 MG PO TBEC      TAKE 1 TABLET (40 MG TOTAL) BY MOUTH DAILY.   30 tablet   3     BP 160/112  Pulse 90  Temp 97.5 F (36.4 C) (Oral)  Resp 23  SpO2 100% Vitals reviewed Physical Exam Physical Examination: General appearance - alert, well appearing, and in no distress Mental status - alert, oriented to person, place, and time Eyes - no scleral icterus, no conjunctival injection Mouth - mucous  membranes moist, pharynx normal without lesions Chest - clear to auscultation, no wheezes, rales or rhonchi, symmetric air entry Heart - normal rate, regular rhythm, normal S1, S2, no murmurs, rubs, clicks or gallops Abdomen - soft, epigastric tenderness to palpation, no gaurding or rebound, nondistended, no masses or organomegaly Extremities - peripheral pulses normal, no pedal edema, no clubbing or cyanosis Skin - normal coloration and turgor, no rashes  ED Course  Procedures (including critical care time)  Labs Reviewed  COMPREHENSIVE METABOLIC PANEL - Abnormal; Notable for the following:    Chloride 94 (*)     Glucose, Bld 137 (*)     AST 92 (*)     ALT 138 (*)     All other components within normal limits  LIPASE, BLOOD - Abnormal; Notable for the following:    Lipase 165 (*)     All other components within normal limits  CBC   Ct Abdomen Pelvis W Contrast  05/18/2012  *RADIOLOGY REPORT*  Clinical Data: Mid to left-sided abdominal pain.  CT ABDOMEN AND PELVIS WITH CONTRAST  Technique:  Multidetector CT imaging of the abdomen and pelvis was performed following the standard protocol during bolus administration of intravenous contrast.  Contrast: 100 mL of Omnipaque 300 IV contrast  Comparison: CT of the abdomen and pelvis performed 09/09/2011  Findings: The visualized lung bases are clear.  There is soft tissue inflammation noted about the tail of the pancreas, compatible with acute pancreatitis.  A small amount of associated fluid is also seen.  The tail of the pancreas demonstrates grossly normal attenuation; there is no evidence for pseudocyst formation or devascularization.  The remainder of the pancreas is unremarkable in appearance.  An adjacent splenule is again noted.  The liver demonstrates diffuse fatty infiltration, with mild sparing about the gallbladder  fossa.  The spleen is unremarkable in appearance.  The gallbladder is within normal limits.  The adrenal glands are grossly  unremarkable.  The kidneys are unremarkable in appearance.  There is no evidence of hydronephrosis.  No renal or ureteral stones are seen.  No perinephric stranding is appreciated.  No free fluid is identified.  The small bowel is unremarkable in appearance.  The stomach is within normal limits.  No acute vascular abnormalities are seen.  The appendix is normal in caliber and contains air, without evidence for appendicitis.  Mild scattered diverticulosis is noted along the distal descending and proximal sigmoid colon, without evidence of diverticulitis.  The bladder is mildly distended and grossly unremarkable.  The prostate remains normal in size.  No inguinal lymphadenopathy is seen.  No acute osseous abnormalities are identified.  IMPRESSION:  1.  Soft tissue inflammation about the tail of the pancreas, compatible with mild acute pancreatitis.  Small amount of associated fluid seen.  No evidence for pseudocyst formation or devascularization; the remainder of the pancreas is unremarkable in appearance.  2.  Diffuse fatty infiltration within the liver. 3.  Mild scattered diverticulosis along the distal descending and proximal sigmoid colon, without evidence of diverticulitis.   Original Report Authenticated By: Tonia Ghent, M.D.      1. Pancreatitis       MDM  Pt presenting with epigastric pain and findings c/w flare of his prior hx of pancreatitis.  Pt feels improved after IV hydration, analgesia and anti-emetics.  Pt discharged with rx for percocet and zofran.  Discharged with strict return precautions.  Pt agreeable with plan.        Ethelda Chick, MD 05/18/12 [phone]

## 2012-05-18 NOTE — Telephone Encounter (Signed)
Please call pt and arrange a 1 week ED follow up visit.

## 2012-05-18 NOTE — ED Provider Notes (Signed)
History     CSN: [id number]  Arrival date & time 05/18/12  2200   First MD Initiated Contact with Patient 05/18/12 2211      Chief Complaint  Patient presents with  . Abdominal Pain    (Consider location/radiation/quality/duration/timing/severity/associated sxs/prior treatment) Patient is a 37 y.o. male presenting with abdominal pain. The history is provided by the patient. No language interpreter was used.  Abdominal Pain The primary symptoms of the illness include abdominal pain. The primary symptoms of the illness do not include fever, shortness of breath, nausea, vomiting or diarrhea. The current episode started 13 to 24 hours ago. The onset of the illness was gradual. The problem has been gradually worsening.  The illness is associated with alcohol use.   Recurring abdominal pain similar to his chronic alcoholic pancreatitis.  He was here last pm for the same.  No nausea and vomiting.  Has been taking percocet every 6 hours for pain with some relief. States the pain is worse and that is why he came back to the ER.  No acute distress noted. No other associated systemic symptoms, alleviating or modifying factors. CT last pm showed pancreatitis.  We will control the pain and give fluid trial.  Recheck lipase.     Past Medical History  Diagnosis Date  . Alcoholic pancreatitis     recurrent  . Diabetes mellitus, type II     New Onset 03/2010  . Hypertension   . 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 S/P lumbar microdiscectomy 11/2001    Dr Danielle Dess    Past Surgical History  Procedure Date  . Lumbar microdiscectomy     Dr Danielle Dess    Family History  Problem Relation Age of Onset  . Diabetes Mother   . Lung cancer Brother     twin brother  . Colon cancer Neg Hx   . Pancreatic cancer Paternal Aunt     History  Substance Use Topics  . Smoking status: Never Smoker   . Smokeless tobacco: Never Used  . Alcohol Use: 0.0  oz/week      Review of Systems  Constitutional: Negative.  Negative for fever.  HENT: Negative.   Eyes: Negative.   Respiratory: Negative.  Negative for shortness of breath.   Cardiovascular: Negative.   Gastrointestinal: Positive for abdominal pain. Negative for nausea, vomiting, diarrhea and abdominal distention.       RUQ pain  Neurological: Negative.   Psychiatric/Behavioral: Negative.   All other systems reviewed and are negative.    Allergies  Review of patient's allergies indicates no known allergies.  Home Medications   Current Outpatient Rx  Name  Route  Sig  Dispense  Refill  . ALPRAZOLAM 0.5 MG PO TABS      Take 1/2 tablet by mouth during the day as needed and 1 at bedtime.   45 tablet   0     PATIENT IS OUT OF MEDICATION   . ATORVASTATIN CALCIUM 10 MG PO TABS   Oral   Take 1 tablet (10 mg total) by mouth daily.   90 tablet   0   . BISOPROLOL FUMARATE 10 MG PO TABS      TAKE 1 TABLET BY MOUTH AT BEDTIME.   90 tablet   0   . CYMBALTA 60 MG PO CPEP      TAKE 1 CAPSULE BY MOUTH DAILY   30 capsule   2   .  FAMOTIDINE 20 MG PO TABS   Oral   Take 1 tablet (20 mg total) by mouth 2 (two) times daily.   60 tablet   2   . GLUCOSE BLOOD VI STRP      1 each. Use to check blood sugar once day          . HYDROCODONE-ACETAMINOPHEN 5-325 MG PO TABS   Oral   Take 2 tablets by mouth every 4 (four) hours as needed for pain.   6 tablet   0   . MELOXICAM 7.5 MG PO TABS   Oral   Take 1 tablet (7.5 mg total) by mouth daily.   7 tablet   0   . METFORMIN HCL 500 MG PO TABS   Oral   Take 2 tablets (1,000 mg total) by mouth 2 (two) times daily with a meal.   120 tablet   3   . ONE-A-DAY MENS PO TABS   Oral   Take 1 tablet by mouth daily.           Marland Kitchen OLMESARTAN MEDOXOMIL-HCTZ 40-25 MG PO TABS   Oral   Take 1 tablet by mouth daily.   90 tablet   0   . ONDANSETRON 8 MG PO TBDP      8mg  ODT q 8 hours prn nausea   12 tablet   0   .  ONDANSETRON HCL 4 MG PO TABS   Oral   Take 1 tablet (4 mg total) by mouth every 6 (six) hours.   12 tablet   0   . ONETOUCH LANCETS MISC   Does not apply   by Does not apply route. Use to check blood sugar once a day          . OXYCODONE HCL 5 MG PO TABS   Oral   Take 1 tablet (5 mg total) by mouth every 4 (four) hours as needed for pain.   15 tablet   0   . OXYCODONE-ACETAMINOPHEN 5-325 MG PO TABS   Oral   Take 1-2 tablets by mouth every 6 (six) hours as needed for pain.   15 tablet   0   . PANTOPRAZOLE SODIUM 40 MG PO TBEC      TAKE 1 TABLET (40 MG TOTAL) BY MOUTH DAILY.   30 tablet   3     BP 160/114  Pulse 78  Temp 97.7 F (36.5 C) (Oral)  Resp 16  Ht 5\' 11"  (1.803 m)  Wt 276 lb (125.193 kg)  BMI 38.49 kg/m2  SpO2 99%  Physical Exam  Nursing note and vitals reviewed. Constitutional: He is oriented to person, place, and time. He appears well-developed and well-nourished.  HENT:  Head: Normocephalic.  Eyes: Conjunctivae normal and EOM are normal. Pupils are equal, round, and reactive to light.  Neck: Normal range of motion. Neck supple.  Cardiovascular: Normal rate.   Pulmonary/Chest: Effort normal and breath sounds normal. No respiratory distress.  Abdominal: Soft. Bowel sounds are normal. He exhibits no distension. There is tenderness. There is no rebound and no guarding.  Musculoskeletal: Normal range of motion.  Neurological: He is alert and oriented to person, place, and time.  Skin: Skin is warm and dry.  Psychiatric: He has a normal mood and affect.    ED Course  Procedures (including critical care time)  Labs Reviewed  LIPASE, BLOOD - Abnormal; Notable for the following:    Lipase 778 (*)     All other components within  normal limits  URINALYSIS, ROUTINE W REFLEX MICROSCOPIC   Ct Abdomen Pelvis W Contrast  05/18/2012  *RADIOLOGY REPORT*  Clinical Data: Mid to left-sided abdominal pain.  CT ABDOMEN AND PELVIS WITH CONTRAST  Technique:   Multidetector CT imaging of the abdomen and pelvis was performed following the standard protocol during bolus administration of intravenous contrast.  Contrast: 100 mL of Omnipaque 300 IV contrast  Comparison: CT of the abdomen and pelvis performed 09/09/2011  Findings: The visualized lung bases are clear.  There is soft tissue inflammation noted about the tail of the pancreas, compatible with acute pancreatitis.  A small amount of associated fluid is also seen.  The tail of the pancreas demonstrates grossly normal attenuation; there is no evidence for pseudocyst formation or devascularization.  The remainder of the pancreas is unremarkable in appearance.  An adjacent splenule is again noted.  The liver demonstrates diffuse fatty infiltration, with mild sparing about the gallbladder fossa.  The spleen is unremarkable in appearance.  The gallbladder is within normal limits.  The adrenal glands are grossly unremarkable.  The kidneys are unremarkable in appearance.  There is no evidence of hydronephrosis.  No renal or ureteral stones are seen.  No perinephric stranding is appreciated.  No free fluid is identified.  The small bowel is unremarkable in appearance.  The stomach is within normal limits.  No acute vascular abnormalities are seen.  The appendix is normal in caliber and contains air, without evidence for appendicitis.  Mild scattered diverticulosis is noted along the distal descending and proximal sigmoid colon, without evidence of diverticulitis.  The bladder is mildly distended and grossly unremarkable.  The prostate remains normal in size.  No inguinal lymphadenopathy is seen.  No acute osseous abnormalities are identified.  IMPRESSION:  1.  Soft tissue inflammation about the tail of the pancreas, compatible with mild acute pancreatitis.  Small amount of associated fluid seen.  No evidence for pseudocyst formation or devascularization; the remainder of the pancreas is unremarkable in appearance.  2.   Diffuse fatty infiltration within the liver. 3.  Mild scattered diverticulosis along the distal descending and proximal sigmoid colon, without evidence of diverticulitis.   Original Report Authenticated By: Tonia Ghent, M.D.      1. Pancreatitis       MDM  Chronic pancreatitis better after IM dilaudid and fluid challenge.  Will continue percocet and zofran at home.  Wife and patient agree with plan.  rx for oxycodone without tylenol per wife's request who is a Engineer, civil (consulting). He will follow up this week with Sandford Craze NP        Remi Haggard, NP 05/19/12 1946

## 2012-05-18 NOTE — Telephone Encounter (Signed)
He should call us back in 3 days and let us know how his pain is and we can decide on further refills at that time.

## 2012-05-19 NOTE — Telephone Encounter (Signed)
Patient has post ED visit scheduled for 05/27/12

## 2012-05-19 NOTE — ED Provider Notes (Signed)
Medical screening examination/treatment/procedure(s) were performed by non-physician practitioner and as supervising physician I was immediately available for consultation/collaboration.   Charles B. Bernette Mayers, MD 05/19/12 Corky Crafts

## 2012-05-21 ENCOUNTER — Telehealth: Payer: Self-pay | Admitting: Family

## 2012-05-21 NOTE — Telephone Encounter (Signed)
Was seen in ED 1-20 and diagnosed with pancreatitis. Thinks was prescribed Oxycodone 5mg  but has been prescribed Percocet by office in past for same diagnosis. Is wanting pain medicine refilled until sees MD in this office 1-27. States was told to call by practitioner and medicine would be refilled. PLEASE CALL PATIENT AND ADVISE IF CAN REFILL EITHER PAIN MEDICINE [phone].

## 2012-05-22 MED ORDER — OXYCODONE-ACETAMINOPHEN 5-325 MG PO TABS: 1.0000 | ORAL_TABLET | Freq: Four times a day (QID) | ORAL | Status: DC | PRN

## 2012-05-22 NOTE — Telephone Encounter (Signed)
Unable to call Rx to Pharmacy as it is a CII drug. Rx printed. Left detailed message on pt's cell# that rx can be picked up today and to call if any questions.

## 2012-05-22 NOTE — Telephone Encounter (Signed)
OK to send percocet 5/325mg  one tablet every 6 hours as needed for pain #20 no refills.

## 2012-05-27 ENCOUNTER — Ambulatory Visit: Payer: 59 | Admitting: Family

## 2012-05-28 ENCOUNTER — Other Ambulatory Visit: Payer: Self-pay | Admitting: Family

## 2012-05-29 MED ORDER — ATORVASTATIN CALCIUM 10 MG PO TABS: 10.0000 mg | ORAL_TABLET | Freq: Every day | ORAL | Status: DC

## 2012-05-29 MED ORDER — OXYCODONE-ACETAMINOPHEN 5-325 MG PO TABS: 1.0000 | ORAL_TABLET | Freq: Four times a day (QID) | ORAL | Status: DC | PRN

## 2012-05-29 NOTE — Telephone Encounter (Signed)
Can send 10 tabs only.  No further refills until seen in office. He was scheduled for apt yesterday which he cancelled.

## 2012-05-29 NOTE — Telephone Encounter (Signed)
Rx printed for #20 tablets per "mychart" message and verbal from Provider. Rx taken to [facility] pharmacy.

## 2012-06-02 ENCOUNTER — Ambulatory Visit: Payer: 59 | Admitting: Family

## 2012-06-09 ENCOUNTER — Other Ambulatory Visit: Payer: Self-pay | Admitting: Family

## 2012-06-09 ENCOUNTER — Encounter: Payer: Self-pay | Admitting: Family

## 2012-06-09 ENCOUNTER — Ambulatory Visit (INDEPENDENT_AMBULATORY_CARE_PROVIDER_SITE_OTHER): Payer: 59 | Admitting: Family

## 2012-06-09 VITALS — BP 126/92 | HR 79 | Temp 97.8°F | Resp 16 | Ht 70.5 in | Wt 269.0 lb

## 2012-06-09 DIAGNOSIS — K859 Acute pancreatitis without necrosis or infection, unspecified: Secondary | ICD-10-CM

## 2012-06-09 DIAGNOSIS — R319 Hematuria, unspecified: Secondary | ICD-10-CM

## 2012-06-09 LAB — HEPATIC FUNCTION PANEL
ALT: 80 U/L — ABNORMAL HIGH (ref 0–53)
AST: 38 U/L — ABNORMAL HIGH (ref 0–37)
Albumin: 4.7 g/dL (ref 3.5–5.2)
Alkaline Phosphatase: 85 U/L (ref 39–117)
Indirect Bilirubin: 0.4 mg/dL (ref 0.0–0.9)
Total Protein: 7.2 g/dL (ref 6.0–8.3)

## 2012-06-09 MED ORDER — ALPRAZOLAM 0.5 MG PO TABS: ORAL_TABLET | ORAL | Status: DC

## 2012-06-09 MED ORDER — OLMESARTAN MEDOXOMIL-HCTZ 40-25 MG PO TABS: 1.0000 | ORAL_TABLET | Freq: Every day | ORAL | Status: DC

## 2012-06-09 NOTE — Telephone Encounter (Signed)
Alprazolam rx called to [facility] pharmacy, #45 x no refills. Message sent to pt via previous "mychart" message.

## 2012-06-09 NOTE — Patient Instructions (Addendum)
Please complete your lab work prior to leaving.  Follow up in 2 months. Call if you see recurrent blood in your urine.

## 2012-06-09 NOTE — Telephone Encounter (Signed)
Rx was called to [place] at [facility]. Also called alprazolam 0.5mg  #45 x no refills to [facility].

## 2012-06-09 NOTE — Progress Notes (Signed)
Subjective:    Patient ID: Willie [name], male    DOB: 1975/12/21, 37 y.o.   MRN: [id number]  HPI  Pt presents today for follow up of his pancreatitis.  He was seen in the ED on 1/20 and diagnosed with pancreatitis.  Denies nausea, vomitting or abdominal pain.    ETOH use- [facility], following with Dr. Marland KitchenA" neuropsychiatrist.  He was given medication for "nerve" and muscle spasm - doesn't know name. He will be working with a therapist.   Low back pain- Started to act up 4 days ago.  He has not taken any medication for his back.  Hematuria- has stopped but had been occurring x 3 weeks following his pancreatitis.    Review of Systems    see HPI  Past Medical History  Diagnosis Date  . Alcoholic pancreatitis     recurrent  . Diabetes mellitus, type II     New Onset 03/2010  . Hypertension   . 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 S/P lumbar microdiscectomy 11/2001    Dr Danielle Dess    History   Social History  . Marital Status: Married    Spouse Name: N/A    Number of Children: 2  . 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 Smoking status: Never Smoker   . Smokeless tobacco: Never Used  . Alcohol Use: 0.0 oz/week  . Drug Use: No  . Sexually Active: Not on file   Other Topics Concern  . 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 Lumbar microdiscectomy      Dr Danielle Dess    Family History  Problem Relation Age of Onset  . Diabetes Mother   . Lung cancer Brother     twin brother  . Colon cancer Neg Hx   . Pancreatic cancer Paternal Aunt     No Known Allergies  Current Outpatient  Prescriptions on File Prior to Visit  Medication Sig Dispense Refill  . ALPRAZolam (XANAX) 0.5 MG tablet Take 1/2 tablet by mouth during the day as needed and 1 at bedtime.  45 tablet  0  . atorvastatin (LIPITOR) 10 MG tablet Take 1 tablet (10 mg total) by mouth daily.  90 tablet  0  . bisoprolol (ZEBETA) 10 MG tablet TAKE 1 TABLET BY MOUTH AT BEDTIME.  90 tablet  0  . CYMBALTA 60 MG capsule TAKE 1 CAPSULE BY MOUTH DAILY  30 capsule  2  . famotidine (PEPCID) 20 MG tablet Take 1 tablet (20 mg total) by mouth 2 (two) times daily.  60 tablet  2  . glucose blood (ONE TOUCH TEST STRIPS) test strip 1 each. Use to check blood sugar once day       . HYDROcodone-acetaminophen (NORCO/VICODIN) 5-325 MG per tablet Take 2 tablets by mouth every 4 (four) hours as needed for pain.  6 tablet  0  . meloxicam (MOBIC) 7.5 MG tablet Take 1 tablet (7.5 mg total) by mouth daily.  7  tablet  0  . metFORMIN (GLUCOPHAGE) 500 MG tablet Take 2 tablets (1,000 mg total) by mouth 2 (two) times daily with a meal.  120 tablet  3  . multivitamin (ONE-A-DAY MEN'S) TABS Take 1 tablet by mouth daily.        Marland Kitchen olmesartan-hydrochlorothiazide (BENICAR HCT) 40-25 MG per tablet Take 1 tablet by mouth daily.  90 tablet  0  . ondansetron (ZOFRAN ODT) 8 MG disintegrating tablet 8mg  ODT q 8 hours prn nausea  12 tablet  0  . ondansetron (ZOFRAN) 4 MG tablet Take 1 tablet (4 mg total) by mouth every 6 (six) hours.  12 tablet  0  . ONE TOUCH LANCETS MISC by Does not apply route. Use to check blood sugar once a day       . oxyCODONE (ROXICODONE) 5 MG immediate release tablet Take 1 tablet (5 mg total) by mouth every 4 (four) hours as needed for pain.  15 tablet  0  . oxyCODONE-acetaminophen (PERCOCET/ROXICET) 5-325 MG per tablet Take 1-2 tablets by mouth every 6 (six) hours as needed for pain.  20 tablet  0  . pantoprazole (PROTONIX) 40 MG tablet TAKE 1 TABLET (40 MG TOTAL) BY MOUTH DAILY.  30 tablet  3   No current facility-administered  medications on file prior to visit.    BP 126/92  Pulse 79  Temp(Src) 97.8 F (36.6 C) (Oral)  Resp 16  Ht 5' 10.5" (1.791 m)  Wt 269 lb (122.018 kg)  BMI 38.04 kg/m2  SpO2 97%    Objective:   Physical Exam  Constitutional: He is oriented to person, place, and time. He appears well-developed and well-nourished. No distress.  Cardiovascular: Normal rate.   No murmur heard. Pulmonary/Chest: Effort normal and breath sounds normal. No respiratory distress. He has no wheezes. He has no rales. He exhibits no tenderness.  Abdominal: Soft. Bowel sounds are normal. He exhibits no distension and no mass. There is no tenderness. There is no rebound and no guarding.  Genitourinary:  Neg cvat  Neurological: He is alert and oriented to person, place, and time.  Psychiatric: He has a normal mood and affect. His behavior is normal. Judgment and thought content normal.          Assessment & Plan:

## 2012-06-10 LAB — URINALYSIS, ROUTINE W REFLEX MICROSCOPIC
Hgb urine dipstick: NEGATIVE
Nitrite: NEGATIVE
Protein, ur: NEGATIVE mg/dL
Specific Gravity, Urine: >1.03 — ABNORMAL HIGH (ref 1.005–1.030)
Urobilinogen, UA: 0.2 mg/dL (ref 0.0–1.0)

## 2012-06-10 LAB — URINALYSIS, MICROSCOPIC ONLY: Bacteria, UA: NONE SEEN

## 2012-06-11 DIAGNOSIS — R319 Hematuria, unspecified: Secondary | ICD-10-CM | POA: Insufficient documentation

## 2012-06-11 DIAGNOSIS — K859 Acute pancreatitis without necrosis or infection, unspecified: Secondary | ICD-10-CM | POA: Insufficient documentation

## 2012-06-11 NOTE — Assessment & Plan Note (Addendum)
Resolved, follow up ua neg. Had recent CT which did not show any kidney stones or renal pathology. I suspect that he may have passed a stone prior to ct. This may explain his recent low back pain. Monitor for now.

## 2012-06-11 NOTE — Assessment & Plan Note (Signed)
Clinically resolves, follow up lab work is notmal. Discussed importance of etoh abstinence.

## 2012-06-19 ENCOUNTER — Other Ambulatory Visit: Payer: Self-pay | Admitting: Family

## 2012-06-20 ENCOUNTER — Other Ambulatory Visit: Payer: Self-pay | Admitting: Family

## 2012-06-22 ENCOUNTER — Other Ambulatory Visit: Payer: Self-pay | Admitting: Family

## 2012-06-22 MED ORDER — BISOPROLOL FUMARATE 10 MG PO TABS: 10.0000 mg | ORAL_TABLET | Freq: Every day | ORAL | Status: DC

## 2012-06-22 MED ORDER — BISOPROLOL FUMARATE 10 MG PO TABS: ORAL_TABLET | ORAL | Status: DC

## 2012-06-23 ENCOUNTER — Ambulatory Visit: Payer: 59 | Admitting: Family

## 2012-07-08 ENCOUNTER — Telehealth: Payer: Self-pay | Admitting: Family

## 2012-07-08 MED ORDER — DULOXETINE HCL 60 MG PO CPEP: ORAL_CAPSULE | ORAL | Status: DC

## 2012-07-08 NOTE — Telephone Encounter (Signed)
Refill sent.

## 2012-07-08 NOTE — Telephone Encounter (Signed)
Refill- cymbalta 60mg  capsule. Take one capsule by mouth daily. Qty 30 last fill 2.11.14

## 2012-07-15 ENCOUNTER — Ambulatory Visit: Payer: 59 | Admitting: Family

## 2012-07-15 DIAGNOSIS — Z0289 Encounter for other administrative examinations: Secondary | ICD-10-CM

## 2012-07-26 ENCOUNTER — Other Ambulatory Visit: Payer: Self-pay | Admitting: Family

## 2012-07-27 MED ORDER — ALPRAZOLAM 0.5 MG PO TABS: ORAL_TABLET | ORAL | Status: DC

## 2012-07-27 NOTE — Telephone Encounter (Signed)
Rx last called in 06/09/12 #45 x no refills. Refill called to [place] at [facility], #45 x no refills.

## 2012-08-04 ENCOUNTER — Encounter (HOSPITAL_COMMUNITY): Payer: Self-pay | Admitting: General Practice

## 2012-08-04 ENCOUNTER — Encounter (HOSPITAL_BASED_OUTPATIENT_CLINIC_OR_DEPARTMENT_OTHER): Payer: Self-pay | Admitting: *Deleted

## 2012-08-04 ENCOUNTER — Inpatient Hospital Stay (HOSPITAL_COMMUNITY): Payer: 59

## 2012-08-04 ENCOUNTER — Encounter: Payer: Self-pay | Admitting: Family

## 2012-08-04 ENCOUNTER — Ambulatory Visit (INDEPENDENT_AMBULATORY_CARE_PROVIDER_SITE_OTHER): Payer: 59 | Admitting: Family

## 2012-08-04 ENCOUNTER — Inpatient Hospital Stay (HOSPITAL_COMMUNITY)
Admission: AD | Admit: 2012-08-04 | Discharge: 2012-08-06 | DRG: 440 | Disposition: A | Discharge status: Home / Self Care | Payer: 59 | Source: Ambulatory Visit | Attending: Internal Medicine | Admitting: Internal Medicine

## 2012-08-04 ENCOUNTER — Emergency Department (HOSPITAL_BASED_OUTPATIENT_CLINIC_OR_DEPARTMENT_OTHER)
Admission: EM | Admit: 2012-08-04 | Discharge: 2012-08-04 | Disposition: A | Discharge status: Home / Self Care | Payer: 59 | Source: Home / Self Care | Attending: Emergency Medicine | Admitting: Emergency Medicine

## 2012-08-04 VITALS — BP 130/90 | HR 75 | Temp 97.8°F | Resp 18 | Wt 261.0 lb

## 2012-08-04 DIAGNOSIS — Z800 Family history of malignant neoplasm of digestive organs: Secondary | ICD-10-CM

## 2012-08-04 DIAGNOSIS — F341 Dysthymic disorder: Secondary | ICD-10-CM | POA: Diagnosis present

## 2012-08-04 DIAGNOSIS — Z833 Family history of diabetes mellitus: Secondary | ICD-10-CM

## 2012-08-04 DIAGNOSIS — Z801 Family history of malignant neoplasm of trachea, bronchus and lung: Secondary | ICD-10-CM

## 2012-08-04 DIAGNOSIS — F1010 Alcohol abuse, uncomplicated: Secondary | ICD-10-CM

## 2012-08-04 DIAGNOSIS — F418 Other specified anxiety disorders: Secondary | ICD-10-CM

## 2012-08-04 DIAGNOSIS — R1031 Right lower quadrant pain: Secondary | ICD-10-CM | POA: Diagnosis present

## 2012-08-04 DIAGNOSIS — K859 Acute pancreatitis without necrosis or infection, unspecified: Principal | ICD-10-CM | POA: Diagnosis present

## 2012-08-04 DIAGNOSIS — K7689 Other specified diseases of liver: Secondary | ICD-10-CM | POA: Diagnosis present

## 2012-08-04 DIAGNOSIS — IMO0002 Reserved for concepts with insufficient information to code with codable children: Secondary | ICD-10-CM | POA: Diagnosis present

## 2012-08-04 DIAGNOSIS — K861 Other chronic pancreatitis: Secondary | ICD-10-CM | POA: Diagnosis present

## 2012-08-04 DIAGNOSIS — I10 Essential (primary) hypertension: Secondary | ICD-10-CM | POA: Diagnosis present

## 2012-08-04 DIAGNOSIS — E785 Hyperlipidemia, unspecified: Secondary | ICD-10-CM | POA: Diagnosis present

## 2012-08-04 DIAGNOSIS — F1020 Alcohol dependence, uncomplicated: Secondary | ICD-10-CM | POA: Diagnosis present

## 2012-08-04 DIAGNOSIS — Z9089 Acquired absence of other organs: Secondary | ICD-10-CM | POA: Insufficient documentation

## 2012-08-04 DIAGNOSIS — B358 Other dermatophytoses: Secondary | ICD-10-CM | POA: Diagnosis present

## 2012-08-04 DIAGNOSIS — Z79899 Other long term (current) drug therapy: Secondary | ICD-10-CM | POA: Insufficient documentation

## 2012-08-04 DIAGNOSIS — E1165 Type 2 diabetes mellitus with hyperglycemia: Secondary | ICD-10-CM | POA: Diagnosis present

## 2012-08-04 DIAGNOSIS — K219 Gastro-esophageal reflux disease without esophagitis: Secondary | ICD-10-CM | POA: Diagnosis present

## 2012-08-04 DIAGNOSIS — E119 Type 2 diabetes mellitus without complications: Secondary | ICD-10-CM | POA: Diagnosis present

## 2012-08-04 HISTORY — DX: Major depressive disorder, single episode, unspecified: F32.9

## 2012-08-04 HISTORY — DX: Gastro-esophageal reflux disease without esophagitis: K21.9

## 2012-08-04 HISTORY — DX: Depression, unspecified: F32.A

## 2012-08-04 HISTORY — DX: Pure hypercholesterolemia, unspecified: E78.00

## 2012-08-04 HISTORY — DX: Sleep apnea, unspecified: G47.30

## 2012-08-04 LAB — HEMOGLOBIN A1C: Hgb A1c MFr Bld: 7.6 % — ABNORMAL HIGH (ref <5.7)

## 2012-08-04 LAB — CBC WITH DIFFERENTIAL/PLATELET
Basophils Absolute: 0 10*3/uL (ref 0.0–0.1)
Basophils Relative: 0 % (ref 0–1)
HCT: 44 % (ref 39.0–52.0)
Hemoglobin: 16.1 g/dL (ref 13.0–17.0)
Lymphocytes Relative: 18 % (ref 12–46)
Monocytes Absolute: 0.9 10*3/uL (ref 0.1–1.0)
Monocytes Relative: 8 % (ref 3–12)
Neutro Abs: 8.4 10*3/uL — ABNORMAL HIGH (ref 1.7–7.7)
Neutrophils Relative %: 73 % (ref 43–77)
WBC: 11.6 10*3/uL — ABNORMAL HIGH (ref 4.0–10.5)

## 2012-08-04 LAB — URINALYSIS, ROUTINE W REFLEX MICROSCOPIC
Hgb urine dipstick: NEGATIVE
Specific Gravity, Urine: 1.019 (ref 1.005–1.030)
Urobilinogen, UA: 0.2 mg/dL (ref 0.0–1.0)
pH: 7 (ref 5.0–8.0)

## 2012-08-04 LAB — COMPREHENSIVE METABOLIC PANEL
AST: 48 U/L — ABNORMAL HIGH (ref 0–37)
Albumin: 4.2 g/dL (ref 3.5–5.2)
Alkaline Phosphatase: 69 U/L (ref 39–117)
BUN: 10 mg/dL (ref 6–23)
CO2: 25 mEq/L (ref 19–32)
Chloride: 92 mEq/L — ABNORMAL LOW (ref 96–112)
Creatinine, Ser: 0.9 mg/dL (ref 0.50–1.35)
GFR calc non Af Amer: >90 mL/min (ref >90)
Potassium: 3.6 mEq/L (ref 3.5–5.1)
Total Bilirubin: 1.2 mg/dL (ref 0.3–1.2)

## 2012-08-04 LAB — TROPONIN I: Troponin I: <0.3 ng/mL (ref <0.30)

## 2012-08-04 LAB — GLUCOSE, CAPILLARY: Glucose-Capillary: 184 mg/dL — ABNORMAL HIGH (ref 70–99)

## 2012-08-04 MED ORDER — SODIUM CHLORIDE 0.9 % IJ SOLN: 3.0000 mL | Freq: Two times a day (BID) | INTRAMUSCULAR | Status: DC

## 2012-08-04 MED ORDER — MORPHINE SULFATE 4 MG/ML IJ SOLN
4.0000 mg | Freq: Once | INTRAMUSCULAR | Status: AC
  Administered 2012-08-04: 4 mg via INTRAVENOUS
  Filled 2012-08-04: qty 1

## 2012-08-04 MED ORDER — PANTOPRAZOLE SODIUM 40 MG PO TBEC
40.0000 mg | DELAYED_RELEASE_TABLET | Freq: Every day | ORAL | Status: DC
  Administered 2012-08-04 – 2012-08-05 (×2): 40 mg via ORAL
  Filled 2012-08-04 (×2): qty 1

## 2012-08-04 MED ORDER — GI COCKTAIL ~~LOC~~
30.0000 mL | Freq: Three times a day (TID) | ORAL | Status: DC | PRN
  Administered 2012-08-04: 30 mL via ORAL
  Filled 2012-08-04: qty 30

## 2012-08-04 MED ORDER — VITAMIN B-1 100 MG PO TABS
100.0000 mg | ORAL_TABLET | Freq: Every day | ORAL | Status: DC
  Administered 2012-08-04 – 2012-08-06 (×3): 100 mg via ORAL
  Filled 2012-08-04 (×3): qty 1

## 2012-08-04 MED ORDER — SODIUM CHLORIDE 0.9 % IV SOLN
INTRAVENOUS | Status: DC
  Administered 2012-08-04 – 2012-08-06 (×6): via INTRAVENOUS

## 2012-08-04 MED ORDER — BISOPROLOL FUMARATE 10 MG PO TABS
10.0000 mg | ORAL_TABLET | Freq: Every day | ORAL | Status: DC
  Administered 2012-08-04 – 2012-08-06 (×3): 10 mg via ORAL
  Filled 2012-08-04 (×3): qty 1

## 2012-08-04 MED ORDER — ONDANSETRON HCL 4 MG PO TABS: 4.0000 mg | ORAL_TABLET | Freq: Four times a day (QID) | ORAL | Status: DC | PRN

## 2012-08-04 MED ORDER — ONDANSETRON HCL 4 MG/2ML IJ SOLN
4.0000 mg | Freq: Once | INTRAMUSCULAR | Status: AC
  Administered 2012-08-04: 4 mg via INTRAVENOUS
  Filled 2012-08-04: qty 2

## 2012-08-04 MED ORDER — ONDANSETRON HCL 4 MG/2ML IJ SOLN
4.0000 mg | Freq: Four times a day (QID) | INTRAMUSCULAR | Status: DC | PRN
  Administered 2012-08-04 (×2): 4 mg via INTRAVENOUS
  Filled 2012-08-04 (×3): qty 2

## 2012-08-04 MED ORDER — SODIUM CHLORIDE 0.9 % IV SOLN
Freq: Once | INTRAVENOUS | Status: AC
  Administered 2012-08-04: 18:00:00 via INTRAVENOUS
  Filled 2012-08-04: qty 1000

## 2012-08-04 MED ORDER — OXYCODONE HCL 5 MG PO TABS
5.0000 mg | ORAL_TABLET | ORAL | Status: DC | PRN
  Administered 2012-08-04 – 2012-08-06 (×9): 5 mg via ORAL
  Filled 2012-08-04: qty 1
  Filled 2012-08-04: qty 2
  Filled 2012-08-04: qty 1
  Filled 2012-08-04: qty 2
  Filled 2012-08-04 (×5): qty 1

## 2012-08-04 MED ORDER — MORPHINE SULFATE 2 MG/ML IJ SOLN
2.0000 mg | INTRAMUSCULAR | Status: DC | PRN
  Administered 2012-08-04 – 2012-08-05 (×4): 2 mg via INTRAVENOUS
  Filled 2012-08-04 (×4): qty 1

## 2012-08-04 MED ORDER — HYDROMORPHONE HCL PF 1 MG/ML IJ SOLN
1.0000 mg | Freq: Once | INTRAMUSCULAR | Status: AC
  Administered 2012-08-04: 1 mg via INTRAVENOUS
  Filled 2012-08-04: qty 1

## 2012-08-04 MED ORDER — HYDRALAZINE HCL 20 MG/ML IJ SOLN
10.0000 mg | Freq: Four times a day (QID) | INTRAMUSCULAR | Status: DC | PRN
  Administered 2012-08-05: 10 mg via INTRAVENOUS
  Filled 2012-08-04: qty 1

## 2012-08-04 MED ORDER — LEVALBUTEROL HCL 0.63 MG/3ML IN NEBU
0.6300 mg | INHALATION_SOLUTION | Freq: Four times a day (QID) | RESPIRATORY_TRACT | Status: DC
  Administered 2012-08-04 (×2): 0.63 mg via RESPIRATORY_TRACT
  Filled 2012-08-04 (×4): qty 3

## 2012-08-04 MED ORDER — ALPRAZOLAM 0.25 MG PO TABS
0.2500 mg | ORAL_TABLET | Freq: Three times a day (TID) | ORAL | Status: DC | PRN
  Administered 2012-08-04 – 2012-08-06 (×2): 0.25 mg via ORAL
  Filled 2012-08-04 (×2): qty 1

## 2012-08-04 MED ORDER — DULOXETINE HCL 60 MG PO CPEP
60.0000 mg | ORAL_CAPSULE | Freq: Every day | ORAL | Status: DC
  Administered 2012-08-04 – 2012-08-05 (×2): 60 mg via ORAL
  Filled 2012-08-04 (×4): qty 1

## 2012-08-04 MED ORDER — INSULIN ASPART 100 UNIT/ML ~~LOC~~ SOLN
0.0000 [IU] | Freq: Three times a day (TID) | SUBCUTANEOUS | Status: DC
  Administered 2012-08-04 (×2): 3 [IU] via SUBCUTANEOUS

## 2012-08-04 MED ORDER — ENOXAPARIN SODIUM 40 MG/0.4ML ~~LOC~~ SOLN: 40.0000 mg | SUBCUTANEOUS | Status: DC

## 2012-08-04 MED ORDER — SODIUM CHLORIDE 0.9 % IV SOLN
INTRAVENOUS | Status: DC
  Administered 2012-08-04: 03:00:00 via INTRAVENOUS

## 2012-08-04 MED ORDER — MORPHINE SULFATE 2 MG/ML IJ SOLN
1.0000 mg | INTRAMUSCULAR | Status: DC | PRN
  Administered 2012-08-04 (×2): 1 mg via INTRAVENOUS
  Filled 2012-08-04 (×2): qty 1

## 2012-08-04 MED ORDER — FOLIC ACID 1 MG PO TABS
1.0000 mg | ORAL_TABLET | Freq: Every day | ORAL | Status: DC
  Administered 2012-08-04 – 2012-08-06 (×3): 1 mg via ORAL
  Filled 2012-08-04 (×3): qty 1

## 2012-08-04 MED ORDER — HYDRALAZINE HCL 20 MG/ML IJ SOLN
5.0000 mg | Freq: Four times a day (QID) | INTRAMUSCULAR | Status: DC | PRN
  Administered 2012-08-04: 5 mg via INTRAVENOUS
  Filled 2012-08-04: qty 1

## 2012-08-04 NOTE — H&P (Addendum)
[facility] History and Physical  Willie [name] ZOX:[id number] DOB: 10/13/1975 DOA: 08/04/2012  Referring physician:  PCP: Lemont Fillers., NP   Chief Complaint: Abdominal pain  HPI:  37year old with history of recurrent alcoholic pancreatitis, who was seen earlier this AM at [facility] ED for acute pancreatitis but left AMA as he had PCP appointment. Lipase was elevated at 1008 and was binging on alcohol 3-4 days. He reports that abdominal pain for the last 2 days. Pain is not associated with nausea/vomitting. He went to the ED where he was found to have an elevated lipase of 1088. It was recommended by the ER physician that the patient be admitted to the hospital for treatment of pancreatitis. The patient refused admission. He has previous hx of recurrent pancreatitis in the setting of alcohol binge. The patient tells me that he and his wife are having problems and that he had an alcohol binge 3-4 days ago.  For his alcohol dependence, is followed by- [facility], following with Dr. Marland KitchenA" neuropsychiatrist. He is on Xanax-  he has been working with outpatient therapist      Review of Systems: negative for the following  Constitutional: Denies fever, chills, diaphoresis, appetite change and fatigue.  HEENT: Denies photophobia, eye pain, redness, hearing loss, ear pain, congestion, sore throat, rhinorrhea, sneezing, mouth sores, trouble swallowing, neck pain, neck stiffness and tinnitus.  Respiratory: Denies SOB, DOE, cough, chest tightness, and wheezing.  Cardiovascular: Denies chest pain, palpitations and leg swelling.  Gastrointestinal: Denies nausea, vomiting, abdominal pain, diarrhea, constipation, blood in stool and abdominal distention.  Genitourinary: Denies dysuria, urgency, frequency, hematuria, flank pain and difficulty urinating.  Musculoskeletal: Denies myalgias, back pain, joint swelling, arthralgias and gait problem.  Skin: Denies pallor, rash and wound.   Neurological: Denies dizziness, seizures, syncope, weakness, light-headedness, numbness and headaches.  Hematological: Denies adenopathy. Easy bruising, personal or family bleeding history  Psychiatric/Behavioral: Denies suicidal ideation, mood changes, confusion, nervousness, sleep disturbance and agitation       Past Medical History  Diagnosis Date  . Alcoholic pancreatitis     recurrent  . Diabetes mellitus, type II     New Onset 03/2010  . Hypertension   . 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 S/P lumbar microdiscectomy 11/2001    Dr Danielle Dess     Past Surgical History  Procedure Laterality Date  . Lumbar microdiscectomy      Dr Danielle Dess      Social History:  reports that he has never smoked. He has never used smokeless tobacco. He reports that  drinks alcohol. He reports that he does not use illicit drugs.    No Known Allergies  Family History  Problem Relation Age of Onset  . Diabetes Mother   . Lung cancer Brother     twin brother  . Colon cancer Neg Hx   . Pancreatic cancer Paternal Aunt      Prior to Admission medications   Medication Sig Start Date End Date Taking? Authorizing Provider  ALPRAZolam Prudy Feeler) 0.5 MG tablet Take 1/2 tablet by mouth during the day as needed and 1 at bedtime. 07/27/12   Sandford Craze, NP  atorvastatin (LIPITOR) 10 MG tablet Take 1 tablet (10 mg total) by mouth daily. 05/29/12   Sandford Craze, NP  bisoprolol (ZEBETA) 10 MG tablet Take 1 tablet (10 mg total) by mouth daily. 06/22/12   Sandford Craze, NP  DULoxetine (CYMBALTA) 60  MG capsule TAKE 1 CAPSULE BY MOUTH DAILY 07/08/12   Sandford Craze, NP  famotidine (PEPCID) 20 MG tablet Take 1 tablet (20 mg total) by mouth 2 (two) times daily. 01/31/12   Sandford Craze, NP  glucose blood (ONE TOUCH TEST STRIPS) test strip 1 each. Use to check blood sugar once day     Historical Provider, MD  metFORMIN (GLUCOPHAGE) 500 MG  tablet Take 2 tablets (1,000 mg total) by mouth 2 (two) times daily with a meal. 03/25/12   Sandford Craze, NP  multivitamin (ONE-A-DAY MEN'S) TABS Take 1 tablet by mouth daily.      Historical Provider, MD  olmesartan-hydrochlorothiazide (BENICAR HCT) 40-25 MG per tablet Take 1 tablet by mouth daily. 06/09/12   Sandford Craze, NP  ondansetron (ZOFRAN) 4 MG tablet Take 1 tablet (4 mg total) by mouth every 6 (six) hours. 05/18/12   Ethelda Chick, MD  ONE TOUCH LANCETS MISC by Does not apply route. Use to check blood sugar once a day     Historical Provider, MD  pantoprazole (PROTONIX) 40 MG tablet TAKE 1 TABLET (40 MG TOTAL) BY MOUTH DAILY. 03/27/12   Sandford Craze, NP     Physical Exam: Filed Vitals:   08/04/12 1040  BP: 152/105  Pulse: 86  Temp: 97.4 F (36.3 C)  TempSrc: Oral  Resp: 22  Height: 5\' 11"  (1.803 m)  Weight: 118.389 kg (261 lb)  SpO2: 100%     Constitutional: Vital signs reviewed. Patient is a well-developed and well-nourished in no acute distress and cooperative with exam. Alert and oriented x3.  Head: Normocephalic and atraumatic  Ear: TM normal bilaterally  Mouth: no erythema or exudates, MMM  Eyes: PERRL, EOMI, conjunctivae normal, No scleral icterus.  Neck: Supple, Trachea midline normal ROM, No JVD, mass, thyromegaly, or carotid bruit present.  Cardiovascular: RRR, S1 normal, S2 normal, no MRG, pulses symmetric and intact bilaterally  Pulmonary/Chest: CTAB, no wheezes, rales, or rhonchi  Abdominal: Bowel sounds are decreased.  + epigastric pain + distension  GU: no CVA tenderness Musculoskeletal: No joint deformities, erythema, or stiffness, ROM full and no nontender Ext: no edema and no cyanosis, pulses palpable bilaterally (DP and PT)  Hematology: no cervical, inginal, or axillary adenopathy.  Neurological: A&O x3, Strenght is normal and symmetric bilaterally, cranial nerve II-XII are grossly intact, no focal motor deficit, sensory intact to  light touch bilaterally.  Skin: Warm, dry and intact. No rash, cyanosis, or clubbing.  Psychiatric: Normal mood and affect. speech and behavior is normal. Judgment and thought content normal. Cognition and memory are normal.       Labs on Admission:    Basic Metabolic Panel:  Recent Labs Lab 08/04/12 0212  NA 132*  K 3.6  CL 92*  CO2 25  GLUCOSE 188*  BUN 10  CREATININE 0.90  CALCIUM 9.9   Liver Function Tests:  Recent Labs Lab 08/04/12 0212  AST 48*  ALT 69*  ALKPHOS 69  BILITOT 1.2  PROT 7.7  ALBUMIN 4.2    Recent Labs Lab 08/04/12 0212  LIPASE 1055*   No results found for this basename: AMMONIA,  in the last 168 hours CBC:  Recent Labs Lab 08/04/12 0212  WBC 11.6*  NEUTROABS 8.4*  HGB 16.1  HCT 44.0  MCV 81.9  PLT 304   Cardiac Enzymes:  Recent Labs Lab 08/04/12 0212  TROPONINI <0.30    BNP (last 3 results) No results found for this basename: PROBNP,  in the last 8760  hours    CBG: No results found for this basename: GLUCAP,  in the last 168 hours  Radiological Exams on Admission: No results found.  EKG:   Assessment/Plan Active Problems:   DIABETES MELLITUS, TYPE II   HYPERTENSION   HYPERLIPIDEMIA   Pancreatitis, acute   1. Acute pancreatitis making the patient n.p.o., start the patient on aggressive IV hydration, obtain a CT scan of the abdomen and pelvis, recheck lipase, 2. Alcohol dependence monitor valve on withdrawal and start the patient on CIWA protocol, folic acid, but none about, thiamine 3. Diabetes check the patient's hemoglobin A1c and start the patient on sliding scale insulin, hold metformin 4. Transaminitis follow function tests likely secondary to alcohol use  Code Status:   full Family Communication: bedside Disposition Plan: admit   Time spent: 70 mins   [facility] Pager [phone]  If 7PM-7AM, please contact night-coverage [email or web address] Password [email or web address] 08/04/2012, 11:11  AM

## 2012-08-04 NOTE — Progress Notes (Signed)
SBP 160-170s . DBP > 110's. Patient previosuly medicated for pain and anxiety. BP remains elevated. Dr. Susie Cassette notified. Orders received.

## 2012-08-04 NOTE — Assessment & Plan Note (Signed)
Spoke with patient. I have advised pt to be admitted to the hospital for further treatment.  He is now agreeable to admission.

## 2012-08-04 NOTE — ED Provider Notes (Signed)
History     CSN: [id number]  Arrival date & time 08/04/12  0144   None     Chief Complaint  Patient presents with  . Abdominal Pain    (Consider location/radiation/quality/duration/timing/severity/associated sxs/prior treatment) HPI Comments: Comes to the ER for evaluation of moderate to severe abdominal pain. Patient reports that his symptoms began around noon today. He has had constant upper abdominal pain with increased belching. He has not had any nausea, vomiting or diarrhea. Patient reports a previous history of pancreatitis secondary to alcohol intake. He reports that the pain he is experiencing today is similar to pancreatitis episodes he has had in the past.  Patient is a 37 y.o. male presenting with abdominal pain.  Abdominal Pain Associated symptoms: no fever     Past Medical History  Diagnosis Date  . Alcoholic pancreatitis     recurrent  . Diabetes mellitus, type II     New Onset 03/2010  . Hypertension   . 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 S/P lumbar microdiscectomy 11/2001    Dr Danielle Dess    Past Surgical History  Procedure Laterality Date  . Lumbar microdiscectomy      Dr Danielle Dess    Family History  Problem Relation Age of Onset  . Diabetes Mother   . Lung cancer Brother     twin brother  . Colon cancer Neg Hx   . Pancreatic cancer Paternal Aunt     History  Substance Use Topics  . Smoking status: Never Smoker   . Smokeless tobacco: Never Used  . Alcohol Use: 0.0 oz/week      Review of Systems  Constitutional: Negative for fever.  Gastrointestinal: Positive for abdominal pain.  All other systems reviewed and are negative.    Allergies  Review of patient's allergies indicates no known allergies.  Home Medications   Current Outpatient Rx  Name  Route  Sig  Dispense  Refill  . ALPRAZolam (XANAX) 0.5 MG tablet      Take 1/2 tablet by mouth during the day as needed and 1 at  bedtime.   45 tablet   0   . atorvastatin (LIPITOR) 10 MG tablet   Oral   Take 1 tablet (10 mg total) by mouth daily.   90 tablet   0   . bisoprolol (ZEBETA) 10 MG tablet      TAKE 1 TABLET BY MOUTH AT BEDTIME.   90 tablet   0   . bisoprolol (ZEBETA) 10 MG tablet   Oral   Take 1 tablet (10 mg total) by mouth daily.   90 tablet   1   . DULoxetine (CYMBALTA) 60 MG capsule      TAKE 1 CAPSULE BY MOUTH DAILY   30 capsule   2   . famotidine (PEPCID) 20 MG tablet   Oral   Take 1 tablet (20 mg total) by mouth 2 (two) times daily.   60 tablet   2   . glucose blood (ONE TOUCH TEST STRIPS) test strip      1 each. Use to check blood sugar once day          . metFORMIN (GLUCOPHAGE) 500 MG tablet   Oral   Take 2 tablets (1,000 mg total) by mouth 2 (two) times daily with a meal.   120 tablet   3   . multivitamin (ONE-A-DAY MEN'S) TABS   Oral  Take 1 tablet by mouth daily.           Marland Kitchen olmesartan-hydrochlorothiazide (BENICAR HCT) 40-25 MG per tablet   Oral   Take 1 tablet by mouth daily.   90 tablet   0   . ondansetron (ZOFRAN ODT) 8 MG disintegrating tablet      8mg  ODT q 8 hours prn nausea   12 tablet   0   . ondansetron (ZOFRAN) 4 MG tablet   Oral   Take 1 tablet (4 mg total) by mouth every 6 (six) hours.   12 tablet   0   . ONE TOUCH LANCETS MISC   Does not apply   by Does not apply route. Use to check blood sugar once a day          . pantoprazole (PROTONIX) 40 MG tablet      TAKE 1 TABLET (40 MG TOTAL) BY MOUTH DAILY.   30 tablet   3     BP 162/115  Pulse 84  Temp(Src) 98.3 F (36.8 C) (Oral)  Resp 18  Ht 5\' 11"  (1.803 m)  Wt 235 lb (106.595 kg)  BMI 32.79 kg/m2  SpO2 97%  Physical Exam  Constitutional: He is oriented to person, place, and time. He appears well-developed and well-nourished. No distress.  HENT:  Head: Normocephalic and atraumatic.  Right Ear: Hearing normal.  Nose: Nose normal.  Mouth/Throat: Oropharynx is  clear and moist and mucous membranes are normal.  Eyes: Conjunctivae and EOM are normal. Pupils are equal, round, and reactive to light.  Neck: Normal range of motion. Neck supple.  Cardiovascular: Normal rate, regular rhythm, S1 normal and S2 normal.  Exam reveals no gallop and no friction rub.   No murmur heard. Pulmonary/Chest: Effort normal and breath sounds normal. No respiratory distress. He exhibits no tenderness.  Abdominal: Soft. Normal appearance and bowel sounds are normal. There is no hepatosplenomegaly. There is tenderness in the right upper quadrant, epigastric area and left upper quadrant. There is no rebound, no guarding, no tenderness at McBurney's point and negative Murphy's sign. No hernia.  Musculoskeletal: Normal range of motion.  Neurological: He is alert and oriented to person, place, and time. He has normal strength. No cranial nerve deficit or sensory deficit. Coordination normal. GCS eye subscore is 4. GCS verbal subscore is 5. GCS motor subscore is 6.  Skin: Skin is warm, dry and intact. No rash noted. No cyanosis.  Psychiatric: He has a normal mood and affect. His speech is normal and behavior is normal. Thought content normal.    ED Course  Procedures (including critical care time)  EKG:  Date: 08/04/2012  Rate: 82  Rhythm: normal sinus rhythm  QRS Axis: normal  Intervals: normal  ST/T Wave abnormalities: normal  Conduction Disutrbances: none  Narrative Interpretation: unremarkable      Labs Reviewed  CBC WITH DIFFERENTIAL - Abnormal; Notable for the following:    WBC 11.6 (*)    MCHC 36.6 (*)    Neutro Abs 8.4 (*)    All other components within normal limits  COMPREHENSIVE METABOLIC PANEL - Abnormal; Notable for the following:    Sodium 132 (*)    Chloride 92 (*)    Glucose, Bld 188 (*)    AST 48 (*)    ALT 69 (*)    All other components within normal limits  LIPASE, BLOOD - Abnormal; Notable for the following:    Lipase 1055 (*)    All other  components  within normal limits  TROPONIN I   No results found.   Diagnosis: Pancreatitis    MDM  Patient presents to the ER for evaluation of abdominal pain. Patient reports a previous history of pancreatitis secondary to alcohol. Patient reports that his epigastric pain today is similar to previous pancreatitis flares. Patient did have diffuse upper abdominal tenderness without guarding or rebound. Blood work shows lipase above 1000. I recommended admission to the patient. He declines admission. I did explain to him that pancreatitis is a potentially life-threatening illness and that he would be best served admitted to the hospital. He declines admission. He understands that he could die. Patient reports that he has a primary care doctor's appointment in the morning. Patient was told to return to the ER immediately if he has any increasing pain, recurrent vomiting or fever. He understands and agrees to the plan. Patient was told that he needs to stay n.p.o. except for fluid intake.        Gilda Crease, MD 08/04/12 [phone]

## 2012-08-04 NOTE — Progress Notes (Signed)
Subjective:    Patient ID: Willie [name], male    DOB: [birth year], 37 y.o.   MRN: [id number]  HPI  Mr. Darnell is a 37 yr old male who presents today in follow up from the ER. ER records are reviewed.  The pt presents today with chief complaint of epigastric pain.  He reports that abdominal pain started around noon yesterday. Pain is not associated with nausea/vomitting.    He went to the ED where he was found to have an elevated lipase of 1088.  It was recommended by the ER physician that the patient be admitted to the hospital for treatment of pancreatitis.    The patient refused admission. He has previous hx of recurrent pancreatitis in the setting of alcohol binge. The patient tells me that he and his wife are having problems and that he had an alcohol binge 3-4 days ago.    Review of Systems See HPI  Past Medical History  Diagnosis Date  . Alcoholic pancreatitis     recurrent  . Diabetes mellitus, type II     New Onset 03/2010  . Hypertension   . 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 S/P lumbar microdiscectomy 11/2001    Dr Danielle Dess    History   Social History  . Marital Status: Married    Spouse Name: N/A    Number of Children: 2  . 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 Smoking status: Never Smoker   . Smokeless tobacco: Never Used  . Alcohol Use: 0.0 oz/week  . Drug Use: No  . Sexually Active: Not on file   Other Topics Concern  . 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 Lumbar microdiscectomy      Dr Danielle Dess    Family History  Problem Relation Age of Onset  . Diabetes Mother    . Lung cancer Brother     twin brother  . Colon cancer Neg Hx   . Pancreatic cancer Paternal Aunt     No Known Allergies  Current Outpatient Prescriptions on File Prior to Visit  Medication Sig Dispense Refill  . ALPRAZolam (XANAX) 0.5 MG tablet Take 1/2 tablet by mouth during the day as needed and 1 at bedtime.  45 tablet  0  . atorvastatin (LIPITOR) 10 MG tablet Take 1 tablet (10 mg total) by mouth daily.  90 tablet  0  . bisoprolol (ZEBETA) 10 MG tablet Take 1 tablet (10 mg total) by mouth daily.  90 tablet  1  . DULoxetine (CYMBALTA) 60 MG capsule TAKE 1 CAPSULE BY MOUTH DAILY  30 capsule  2  . famotidine (PEPCID) 20 MG tablet Take 1 tablet (20 mg total) by mouth 2 (two) times daily.  60 tablet  2  . glucose blood (ONE TOUCH TEST STRIPS) test strip 1 each. Use to check blood sugar once day       . metFORMIN (GLUCOPHAGE) 500 MG tablet Take 2 tablets (1,000 mg total) by  mouth 2 (two) times daily with a meal.  120 tablet  3  . multivitamin (ONE-A-DAY MEN'S) TABS Take 1 tablet by mouth daily.        Marland Kitchen olmesartan-hydrochlorothiazide (BENICAR HCT) 40-25 MG per tablet Take 1 tablet by mouth daily.  90 tablet  0  . ondansetron (ZOFRAN) 4 MG tablet Take 1 tablet (4 mg total) by mouth every 6 (six) hours.  12 tablet  0  . ONE TOUCH LANCETS MISC by Does not apply route. Use to check blood sugar once a day       . pantoprazole (PROTONIX) 40 MG tablet TAKE 1 TABLET (40 MG TOTAL) BY MOUTH DAILY.  30 tablet  3   No current facility-administered medications on file prior to visit.    BP 130/90  Pulse 75  Temp(Src) 97.8 F (36.6 C) (Oral)  Resp 18  Wt 261 lb 0.6 oz (118.407 kg)  BMI 36.42 kg/m2  SpO2 97%       Objective:   Physical Exam  Constitutional: He is oriented to person, place, and time. He appears well-developed and well-nourished. No distress.  Uncomfortable, sick appearing  HENT:  Head: Normocephalic and atraumatic.  Cardiovascular: Normal rate and regular rhythm.    Pulmonary/Chest: Effort normal and breath sounds normal. No respiratory distress. He has no wheezes. He has no rales.  Abdominal: Bowel sounds are decreased.  + epigastric pain + distension  Lymphadenopathy:    He has no cervical adenopathy.  Neurological: He is alert and oriented to person, place, and time.  Skin: Skin is warm and dry.  Psychiatric: He has a normal mood and affect. His behavior is normal. Judgment and thought content normal.          Assessment & Plan:  Case was reviewed with Dr. Isidoro Donning who has accepted to the patient to [facility] team 10/med surg bed.

## 2012-08-04 NOTE — Plan of Care (Signed)
Called by Ms. Sandford Craze, NP [facility] primary care for patient Willie [name] 37year old with history of recurrent alcoholic pancreatitis, who was seen earlier this AM at [facility] ED for acute pancreatitis but left AMA as he had PCP appointment. Lipase was elevated at 1008 and was binging on alcohol 3-4 days. Patient accepted for direct admit to med surg bed, team 10.   RAI,RIPUDEEP M.D. [facility] 08/04/2012, 9:22 AM  Pager: [phone]

## 2012-08-04 NOTE — ED Notes (Signed)
Pt c/o pancreatitis flare up. C/o epigastric pain and belching. Denies N/V/D.

## 2012-08-04 NOTE — Patient Instructions (Signed)
Please go directly to [facility] Admitting office. You are being admitted for Pancreatitis to [facility] Team 10.

## 2012-08-04 NOTE — Progress Notes (Signed)
Patient admitted to [facility] Room 21. Dx: pancreatitis. C/o abdominal pain.Oriented to room. Call bell in reach. M.D. Notified of admission and c/os.

## 2012-08-05 DIAGNOSIS — I10 Essential (primary) hypertension: Secondary | ICD-10-CM

## 2012-08-05 DIAGNOSIS — E119 Type 2 diabetes mellitus without complications: Secondary | ICD-10-CM

## 2012-08-05 DIAGNOSIS — F341 Dysthymic disorder: Secondary | ICD-10-CM

## 2012-08-05 DIAGNOSIS — F1010 Alcohol abuse, uncomplicated: Secondary | ICD-10-CM

## 2012-08-05 DIAGNOSIS — K859 Acute pancreatitis without necrosis or infection, unspecified: Principal | ICD-10-CM

## 2012-08-05 LAB — COMPREHENSIVE METABOLIC PANEL
ALT: 48 U/L (ref 0–53)
Albumin: 3.7 g/dL (ref 3.5–5.2)
Alkaline Phosphatase: 65 U/L (ref 39–117)
Chloride: 97 mEq/L (ref 96–112)
Glucose, Bld: 141 mg/dL — ABNORMAL HIGH (ref 70–99)
Potassium: 3.5 mEq/L (ref 3.5–5.1)
Sodium: 135 mEq/L (ref 135–145)
Total Protein: 7.3 g/dL (ref 6.0–8.3)

## 2012-08-05 LAB — CBC
Hemoglobin: 14.4 g/dL (ref 13.0–17.0)
MCHC: 35.5 g/dL (ref 30.0–36.0)
RDW: 14.1 % (ref 11.5–15.5)
WBC: 12.6 10*3/uL — ABNORMAL HIGH (ref 4.0–10.5)

## 2012-08-05 LAB — GLUCOSE, CAPILLARY: Glucose-Capillary: 136 mg/dL — ABNORMAL HIGH (ref 70–99)

## 2012-08-05 MED ORDER — PANTOPRAZOLE SODIUM 40 MG PO TBEC
40.0000 mg | DELAYED_RELEASE_TABLET | Freq: Two times a day (BID) | ORAL | Status: DC
  Administered 2012-08-05 – 2012-08-06 (×2): 40 mg via ORAL
  Filled 2012-08-05 (×2): qty 1

## 2012-08-05 MED ORDER — INSULIN ASPART 100 UNIT/ML ~~LOC~~ SOLN
0.0000 [IU] | SUBCUTANEOUS | Status: DC
  Administered 2012-08-05 (×3): 2 [IU] via SUBCUTANEOUS
  Administered 2012-08-05: 3 [IU] via SUBCUTANEOUS
  Administered 2012-08-06: 2 [IU] via SUBCUTANEOUS
  Administered 2012-08-06: 5 [IU] via SUBCUTANEOUS
  Administered 2012-08-06: 3 [IU] via SUBCUTANEOUS

## 2012-08-05 NOTE — Progress Notes (Signed)
[facility] PROGRESS NOTE  Willie [name] RUE:[id number] DOB: [birth year] DOA: 08/04/2012 PCP: Willie [name]., NP  Assessment/Plan: 1-Acute pancreatitis: secondary to alcohol abuse. Abd [place] w/o cholelithiasis or cholecystitis. -continue supportive care (anlgesics, antiemetics and IVF's) -advance die to [facility]D -check lipase in am  2-GERD: continue PPI  3-Alcohol abuse: counseling provided; will continue CIWA protocol, thiamine and folic acid. -SW consulted for outpatient resources   4- Transaminitis: ALT and AST back to normal. Due to alcohol and pancreatitis. Will monitor.  5-DM: continue SSI  6-depression:continue cymbalta  7-HTN:continue bisoprolol  DVT: SCD's  Code Status: Full Family Communication: wife at bedside Disposition Plan: home when medically stable; social work consult for assistance with outpatient resources to help quitting alcohol.   Consultants:  Social work  Procedures:  See below for x-ray reports  Antibiotics:  none  HPI/Subjective: Afebrile, no nausea, no vomiting and no abdominal pain.  Objective: Filed Vitals:   08/05/12 0003 08/05/12 0145 08/05/12 0540 08/05/12 1355  BP: 176/108 151/95 154/88 131/84  Pulse: 73  96 81  Temp:   98.1 F (36.7 C) 97.7 F (36.5 C)  TempSrc:   Oral Oral  Resp:   20 18  Height:      Weight:      SpO2:   99% 99%    Intake/Output Summary (Last 24 hours) at 08/05/12 1710 Last data filed at 08/05/12 1356  Gross per 24 hour  Intake 2397.5 ml  Output      0 ml  Net 2397.5 ml   Filed Weights   08/04/12 1040  Weight: 118.389 kg (261 lb)    Exam:   General:  Afebrile, feeling better; no nausea, no vomiting  Cardiovascular: s1 and S2, no rubs or gallops  Respiratory: CTA; no wheezing  Abdomen: mid epigastric tenderness, no rebound or guarding; positive BS  Musculoskeletal: no joint swelling, no erythema.  Data Reviewed: Basic Metabolic Panel:  Recent Labs Lab 08/04/12 0212  08/04/12 1122 08/05/12 0450  NA 132*  --  135  K 3.6  --  3.5  CL 92*  --  97  CO2 25  --  26  GLUCOSE 188*  --  141*  BUN 10  --  7  CREATININE 0.90  --  0.77  CALCIUM 9.9  --  9.0  MG  --  1.8  --   PHOS  --  3.3  --    Liver Function Tests:  Recent Labs Lab 08/04/12 0212 08/05/12 0450  AST 48* 29  ALT 69* 48  ALKPHOS 69 65  BILITOT 1.2 0.7  PROT 7.7 7.3  ALBUMIN 4.2 3.7    Recent Labs Lab 08/04/12 0212 08/05/12 0450  LIPASE 1055* 265*   CBC:  Recent Labs Lab 08/04/12 0212 08/05/12 0450  WBC 11.6* 12.6*  NEUTROABS 8.4*  --   HGB 16.1 14.4  HCT 44.0 40.6  MCV 81.9 80.9  PLT 304 263   Cardiac Enzymes:  Recent Labs Lab 08/04/12 0212  TROPONINI <0.30   CBG:  Recent Labs Lab 08/04/12 1725 08/04/12 2135 08/05/12 0740 08/05/12 1201 08/05/12 1635  GLUCAP 199* 184* 147* 127* 123*    Studies: US Abdomen Complete  08/04/2012  *RADIOLOGY REPORT*  Clinical Data:  Abdominal pain.  Alcoholic pancreatitis.  Diabetes and hypertension.  COMPLETE ABDOMINAL ULTRASOUND  Comparison:  CT 05/18/2012.  Ultrasound 04/14/2010  Findings:  Gallbladder:  Normal, without wall thickening, stone, or pericholecystic fluid.  Sonographic Murphy's sign was not elicited.  Common bile duct: Normal,  5 mm.  Liver: Moderately to markedly increased in echogenicity.  IVC: Negative  Pancreas:  Not visualized due to overlying bowel gas.  Spleen:  Normal in size and echogenicity.  Right Kidney:  13.0 cm. No hydronephrosis.  Left Kidney:  13.3 cm. No hydronephrosis.  Poorly visualized due to overlying bowel gas.  Abdominal aorta:  Poorly visualized due to overlying bowel gas.  No aneurysm or ascites identified.  IMPRESSION:  1. Portions of anatomy obscured by overlying bowel gas. 2.  Hepatic steatosis. 3.  No evidence of cholelithiasis or biliary ductal dilatation.   Original Report Authenticated By: Jeronimo Greaves, M.D.     Scheduled Meds: . bisoprolol  10 mg Oral Daily  . DULoxetine  60 mg  Oral QHS  . folic acid  1 mg Oral Daily  . insulin aspart  0-15 Units Subcutaneous Q4H  . pantoprazole  40 mg Oral Daily  . sodium chloride  3 mL Intravenous Q12H  . thiamine  100 mg Oral Daily   Continuous Infusions: . sodium chloride 150 mL/hr at 08/05/12 1049    Active Problems:   DIABETES MELLITUS, TYPE II   HYPERTENSION   HYPERLIPIDEMIA   Pancreatitis, acute    Time spent: >30 minutes    [name]  [facility] Pager [phone]. If 7PM-7AM, please contact night-coverage at [email or web address], password [facility] 08/05/2012, 5:10 PM  LOS: 1 day

## 2012-08-05 NOTE — Progress Notes (Signed)
Pt BP still elevated at 158/100. Not due for next dose of hydralazine. Floor coverage physician notified and orders given for an increase of hydralazine to 10mg . Will give hydralazine to pt and monitor BP further.

## 2012-08-06 DIAGNOSIS — E785 Hyperlipidemia, unspecified: Secondary | ICD-10-CM

## 2012-08-06 LAB — BASIC METABOLIC PANEL
BUN: 5 mg/dL — ABNORMAL LOW (ref 6–23)
CO2: 27 mEq/L (ref 19–32)
Chloride: 101 mEq/L (ref 96–112)
Creatinine, Ser: 0.84 mg/dL (ref 0.50–1.35)
GFR calc Af Amer: >90 mL/min (ref >90)
Potassium: 3.4 mEq/L — ABNORMAL LOW (ref 3.5–5.1)

## 2012-08-06 LAB — GLUCOSE, CAPILLARY
Glucose-Capillary: 119 mg/dL — ABNORMAL HIGH (ref 70–99)
Glucose-Capillary: 184 mg/dL — ABNORMAL HIGH (ref 70–99)

## 2012-08-06 LAB — LIPASE, BLOOD: Lipase: 81 U/L — ABNORMAL HIGH (ref 11–59)

## 2012-08-06 MED ORDER — ACETAMINOPHEN 325 MG PO TABS
650.0000 mg | ORAL_TABLET | Freq: Once | ORAL | Status: AC
  Administered 2012-08-06: 650 mg via ORAL
  Filled 2012-08-06: qty 2

## 2012-08-06 MED ORDER — OXYCODONE HCL 5 MG PO TABS: 5.0000 mg | ORAL_TABLET | Freq: Four times a day (QID) | ORAL | Status: DC | PRN

## 2012-08-06 MED ORDER — CLOTRIMAZOLE 1 % EX CREA
TOPICAL_CREAM | Freq: Two times a day (BID) | CUTANEOUS | Status: DC
  Administered 2012-08-06: 13:00:00 via TOPICAL
  Filled 2012-08-06 (×2): qty 15

## 2012-08-06 MED ORDER — CLOTRIMAZOLE 1 % EX CREA: TOPICAL_CREAM | Freq: Two times a day (BID) | CUTANEOUS | Status: DC

## 2012-08-06 NOTE — Clinical Social Work Psychosocial (Signed)
     Clinical Social Work Department BRIEF PSYCHOSOCIAL ASSESSMENT 08/06/2012  Patient:  Willie [name], Willie [name]     Account Number:  [id number]     Admit date:  08/04/2012  Clinical Social Worker:  Hulan Fray  Date/Time:  08/06/2012 10:13 AM  Referred by:  Physician  Date Referred:  08/06/2012 Referred for  Other - See comment   Other Referral:   "Alcohol abuse; would like outpatient resources and assistance to quit."   Interview type:  Patient Other interview type:    PSYCHOSOCIAL DATA Living Status:  FAMILY Admitted from facility:   Level of care:   Primary support name:  Dianna Limbo Primary support relationship to patient:  SPOUSE Degree of support available:   supportive    CURRENT CONCERNS Current Concerns  Other - See comment   Other Concerns:   Resources for substance abuse    SOCIAL WORK ASSESSMENT / PLAN Clinical Social Worker received referral for resources for substance abuse, mainly alcohol. CSW introduced self and explained reason for visit. Patient reported that he was interested in resouces for alcohol abuse, like AA meetings and outpatient facilities. Patient reported that his main reason for seeking treatment is for alcohol abuse. Patient reported that he does have a good support system around him that consists of family and friends. CSW provided packet that included, inpatient/outpatient intensive treatment facilities and AA meetings in [place]. Patient did not report any further concerns for this CSW. CSW will sign off, as social work intervention is no longer needed.   Assessment/plan status:  Information/Referral to [facility] Other assessment/ plan:   Information/referral to community resources:   AA meetings in [employer].  Inpatient/outpatient substance abuse treatment facilities.    PATIENTS/FAMILYS RESPONSE TO PLAN OF CARE: Patient was agreeable to receiving resources for AA meetings and inpatient/outpatient substance  abuse treatment facilities. Patient was appreciative of CSW"s assistance and resources provided.

## 2012-08-06 NOTE — Discharge Summary (Signed)
Physician Discharge Summary  Willie [name]:[id number] DOB: 1976-02-15 DOA: 08/04/2012  PCP: Lemont Fillers., NP  Admit date: 08/04/2012 Discharge date: 08/06/2012  Time spent: >30 minutes minutes  Recommendations for Outpatient Follow-up:  1. BMET to follow electrolytes 2. Reassess BP and adjust medications as needed 3. Medication compliance and alcohol abstinence to be encouraged  Discharge Diagnoses:  Active Problems:   DIABETES MELLITUS, TYPE II   HYPERTENSION   HYPERLIPIDEMIA   Pancreatitis, acute Liver steatosis Skin fungal infection Alcohol abuse Depression/Anxiety  Discharge Condition: stable and improved. Discharged home. Instructed to take medications as prescribed, to stop drinking ETOH and to follow low fat diet.  Diet recommendation: low carbohydrates, low sodium and low fat diet.  Filed Weights   08/04/12 1040  Weight: 118.389 kg (261 lb)    History of present illness:  37year old with history of recurrent alcoholic pancreatitis, who was seen earlier this AM at [facility] ED for acute pancreatitis but left AMA as he had PCP appointment. Lipase was elevated at 1008 and was binging on alcohol 3-4 days.  He reports that abdominal pain for the last 2 days. Pain is not associated with nausea/vomitting. He went to the ED where he was found to have an elevated lipase of 1088. It was recommended by the ER physician that the patient be admitted to the hospital for treatment of pancreatitis. The patient refused admission. He has previous hx of recurrent pancreatitis in the setting of alcohol binge. The patient tells me that he and his wife are having problems and that he had an alcohol binge 3-4 days ago.    Hospital Course:  1-Acute pancreatitis: secondary to alcohol abuse. Abd [place] w/o cholelithiasis or cholecystitis.  -lipase down to 81; no further significant abdominal pain and able to tolerate diet and medications. -advise to stop drinking alcohol and to keep himself  hydrated. -will follow with PCP in 2 weeks  2-GERD: continue PPI   3-Alcohol abuse: counseling provided; no withdrawal appreciated while inpatient. -resume MV (to provide thiamine and folic acid) -SW consulted for outpatient resources   4- Transaminitis: ALT and AST back to normal. Due to alcohol and pancreatitis.   5-DM: low carb diet and resumption of metformin  6-depression/anxiety: continue cymbalta and PRN xanax  7-HTN: continue bisoprolol and heart healthy diet  8-skin fungal infection: affecting his penis. Started on clotrimazole BID.  9-HLD: continue statins  10-liver steatosis: due to alcohol and HLD most likely. Continue statins; alcohol abstinence counseled.  Procedures:  See below for x-ray reports  Consultations:  SW (for alcohol cessation and outpatient resources)  Discharge Exam: Filed Vitals:   08/05/12 1355 08/05/12 2135 08/06/12 0545 08/06/12 1359  BP: 131/84 148/89 133/86 142/76  Pulse: 81 87 76 85  Temp: 97.7 F (36.5 C) 98.4 F (36.9 C) 98.4 F (36.9 C) 98.1 F (36.7 C)  TempSrc: Oral Oral Oral Oral  Resp: 18 18 18 20   Height:      Weight:      SpO2: 99% 100% 98% 100%   General: Afebrile, feeling better; no nausea, no vomiting  Cardiovascular: s1 and S2, no rubs or gallops  Respiratory: CTA; no wheezing  Abdomen: mild epigastric tenderness, no rebound or guarding; positive BS  Musculoskeletal: no joint swelling, no erythema.    Discharge Instructions  Discharge Orders   Future Appointments Provider Department Dept Phone   08/11/2012 9:45 AM Sandford Craze, NP [facility] at  [facility] [phone]   Future Orders Complete By Expires  Diet - low sodium heart healthy  As directed     Discharge instructions  As directed     Comments:      -stop alcohol -follow low fat diet -take medications as prescribed -follow with PCP in 2 weeks        Medication List    TAKE these medications       acetaminophen 500 MG  tablet  Commonly known as:  TYLENOL  Take 1,000 mg by mouth every 6 (six) hours as needed for pain.     ALPRAZolam 0.5 MG tablet  Commonly known as:  XANAX  Take 0.5 mg by mouth at bedtime.     atorvastatin 10 MG tablet  Commonly known as:  LIPITOR  Take 1 tablet (10 mg total) by mouth daily.     bisoprolol 10 MG tablet  Commonly known as:  ZEBETA  Take 1 tablet (10 mg total) by mouth daily.     clotrimazole 1 % cream  Commonly known as:  LOTRIMIN  Apply topically 2 (two) times daily. Apply to penis skin twice a day and keep area clean and dry     DULoxetine 60 MG capsule  Commonly known as:  CYMBALTA  Take 60 mg by mouth at bedtime.     metFORMIN 500 MG tablet  Commonly known as:  GLUCOPHAGE  Take 1,000 mg by mouth 2 (two) times daily with a meal.     multivitamin Tabs  Take 1 tablet by mouth daily.     olmesartan-hydrochlorothiazide 40-25 MG per tablet  Commonly known as:  BENICAR HCT  Take 1 tablet by mouth daily.     ONE TOUCH LANCETS Misc  by Does not apply route. Use to check blood sugar once a day     ONE TOUCH TEST STRIPS test strip  Generic drug:  glucose blood  1 each. Use to check blood sugar once day     oxyCODONE 5 MG immediate release tablet  Commonly known as:  Oxy IR/ROXICODONE  Take 1 tablet (5 mg total) by mouth every 6 (six) hours as needed.     pantoprazole 40 MG tablet  Commonly known as:  PROTONIX  Take 40 mg by mouth daily.           Follow-up Information   Follow up with Lemont Fillers., NP. Schedule an appointment as soon as possible for a visit in 2 weeks.   Contact information:   [address] [place] [address] [phone]        The results of significant diagnostics from this hospitalization (including imaging, microbiology, ancillary and laboratory) are listed below for reference.    Significant Diagnostic Studies: US Abdomen Complete  08/04/2012  *RADIOLOGY REPORT*  Clinical Data:  Abdominal pain.   Alcoholic pancreatitis.  Diabetes and hypertension.  COMPLETE ABDOMINAL ULTRASOUND  Comparison:  CT 05/18/2012.  Ultrasound 04/14/2010  Findings:  Gallbladder:  Normal, without wall thickening, stone, or pericholecystic fluid.  Sonographic Murphy's sign was not elicited.  Common bile duct: Normal, 5 mm.  Liver: Moderately to markedly increased in echogenicity.  IVC: Negative  Pancreas:  Not visualized due to overlying bowel gas.  Spleen:  Normal in size and echogenicity.  Right Kidney:  13.0 cm. No hydronephrosis.  Left Kidney:  13.3 cm. No hydronephrosis.  Poorly visualized due to overlying bowel gas.  Abdominal aorta:  Poorly visualized due to overlying bowel gas.  No aneurysm or ascites identified.  IMPRESSION:  1. Portions of anatomy obscured by  overlying bowel gas. 2.  Hepatic steatosis. 3.  No evidence of cholelithiasis or biliary ductal dilatation.   Original Report Authenticated By: Jeronimo Greaves, M.D.     Labs: Basic Metabolic Panel:  Recent Labs Lab 08/04/12 0212 08/04/12 1122 08/05/12 0450 08/06/12 0545  NA 132*  --  135 136  K 3.6  --  3.5 3.4*  CL 92*  --  97 101  CO2 25  --  26 27  GLUCOSE 188*  --  141* 142*  BUN 10  --  7 5*  CREATININE 0.90  --  0.77 0.84  CALCIUM 9.9  --  9.0 8.6  MG  --  1.8  --   --   PHOS  --  3.3  --   --    Liver Function Tests:  Recent Labs Lab 08/04/12 0212 08/05/12 0450  AST 48* 29  ALT 69* 48  ALKPHOS 69 65  BILITOT 1.2 0.7  PROT 7.7 7.3  ALBUMIN 4.2 3.7    Recent Labs Lab 08/04/12 0212 08/05/12 0450 08/06/12 0545  LIPASE 1055* 265* 81*   CBC:  Recent Labs Lab 08/04/12 0212 08/05/12 0450  WBC 11.6* 12.6*  NEUTROABS 8.4*  --   HGB 16.1 14.4  HCT 44.0 40.6  MCV 81.9 80.9  PLT 304 263   Cardiac Enzymes:  Recent Labs Lab 08/04/12 0212  TROPONINI <0.30   CBG:  Recent Labs Lab 08/05/12 1935 08/05/12 2345 08/06/12 0322 08/06/12 0730 08/06/12 1158  GLUCAP 163* 136* 119* 184* 113*      Signed:  Kirstyn Lean  [facility] 08/06/2012, 3:10 PM

## 2012-08-07 ENCOUNTER — Other Ambulatory Visit: Payer: Self-pay | Admitting: Family

## 2012-08-07 ENCOUNTER — Telehealth: Payer: Self-pay | Admitting: Family

## 2012-08-07 NOTE — Telephone Encounter (Signed)
Rx request to pharmacy/SLS  

## 2012-08-07 NOTE — Telephone Encounter (Signed)
Pls call pt to request a 2 week hospital follow up.

## 2012-08-07 NOTE — Telephone Encounter (Signed)
Patient scheduled appointment for 08/25/12

## 2012-08-11 ENCOUNTER — Ambulatory Visit: Payer: 59 | Admitting: Family

## 2012-08-18 ENCOUNTER — Other Ambulatory Visit: Payer: Self-pay | Admitting: Family

## 2012-08-24 ENCOUNTER — Other Ambulatory Visit: Payer: Self-pay | Admitting: Family

## 2012-08-25 ENCOUNTER — Ambulatory Visit: Payer: 59 | Admitting: Family

## 2012-08-25 MED ORDER — OLMESARTAN MEDOXOMIL-HCTZ 40-25 MG PO TABS: 1.0000 | ORAL_TABLET | Freq: Every day | ORAL | Status: DC

## 2012-08-25 MED ORDER — ATORVASTATIN CALCIUM 10 MG PO TABS: 10.0000 mg | ORAL_TABLET | Freq: Every day | ORAL | Status: DC

## 2012-08-26 ENCOUNTER — Ambulatory Visit (INDEPENDENT_AMBULATORY_CARE_PROVIDER_SITE_OTHER): Payer: 59 | Admitting: Family

## 2012-08-26 ENCOUNTER — Telehealth: Payer: Self-pay | Admitting: Family

## 2012-08-26 ENCOUNTER — Telehealth: Payer: Self-pay | Admitting: *Deleted

## 2012-08-26 ENCOUNTER — Encounter: Payer: Self-pay | Admitting: Family

## 2012-08-26 VITALS — BP 130/70 | HR 98 | Temp 97.6°F | Resp 16 | Wt 256.0 lb

## 2012-08-26 DIAGNOSIS — R21 Rash and other nonspecific skin eruption: Secondary | ICD-10-CM

## 2012-08-26 DIAGNOSIS — I10 Essential (primary) hypertension: Secondary | ICD-10-CM

## 2012-08-26 DIAGNOSIS — N489 Disorder of penis, unspecified: Secondary | ICD-10-CM

## 2012-08-26 DIAGNOSIS — E119 Type 2 diabetes mellitus without complications: Secondary | ICD-10-CM

## 2012-08-26 DIAGNOSIS — K859 Acute pancreatitis without necrosis or infection, unspecified: Secondary | ICD-10-CM

## 2012-08-26 LAB — HEPATIC FUNCTION PANEL
Bilirubin, Direct: 0.1 mg/dL (ref 0.0–0.3)
Total Bilirubin: 0.8 mg/dL (ref 0.3–1.2)

## 2012-08-26 LAB — BASIC METABOLIC PANEL
Calcium: 10.4 mg/dL (ref 8.4–10.5)
Creat: 1 mg/dL (ref 0.50–1.35)

## 2012-08-26 MED ORDER — ALPRAZOLAM 0.5 MG PO TABS: 0.5000 mg | ORAL_TABLET | Freq: Every day | ORAL | Status: DC

## 2012-08-26 NOTE — Telephone Encounter (Signed)
OK to send #[phone] with zero refills.

## 2012-08-26 NOTE — Telephone Encounter (Signed)
Refill attempted to be sent to pharmacy at pt's office visit today but printed. Per verbal auth from Provider, ok to call refill to pharmacy. Rx called to [place] at [facility].

## 2012-08-26 NOTE — Assessment & Plan Note (Addendum)
Clinically resolved.  Pt counseled on importance of complete alcohol cessation. Obtain follow up LFT/lipase. I suspect that his left upper back pain was referred back pain from his pancreatitis.

## 2012-08-26 NOTE — Telephone Encounter (Signed)
Refill- alprazolam 0.5mg  tablet. Take 1/2 tablet by mouth during the day and one tablet by mouth at night as needed. Qty 30 last fill 3.31.14

## 2012-08-26 NOTE — Progress Notes (Signed)
Subjective:    Patient ID: Willie [name], male    DOB: 1975/09/20, 37 y.o.   MRN: [id number]  HPI  Willie [name] is a 37 yr old male who presents today for hospital follow up. He was admitted on 08/04/12-08/06/12 for pancreatitis. He denies recent abdominal pain. Tolerating PO's without difficulty. Hospital records are reviewed.  Alcohol abuse- he reports that since returning from the hospital he has had "some alcohol but not a lot."   Penile rash- he was started on bid clotrimazole in the hospital. Reports that this has cleared up. He does report recent new sexual partner and that rash initially appeared to be blisters but then the "skin peeled off."   Reports intermittent sharp pain left upper back which occurred during pancreatitis episode and shortly thereafter. Since that time he denies any further symptoms.   Review of Systems See HPI  Past Medical History  Diagnosis Date  . Alcoholic pancreatitis     recurrent  . Diabetes mellitus, type II     New Onset 03/2010  . Hypertension   . 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 High cholesterol   . Sleep apnea   . GERD (gastroesophageal reflux disease)   . Depression     History   Social History  . Marital Status: Married    Spouse Name: N/A    Number of Children: 2  . 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 Smoking status: Never Smoker   . Smokeless tobacco: Never Used  . Alcohol Use: 9.0 oz/week    15 Cans of beer per week     Comment: 08/04/2012 "maybe 15 beers/wk; I don't drink qd"  . Drug Use: No  . Sexually Active: Yes   Other Topics Concern  . 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 Lumbar microdiscectomy  ~ 2004    Dr Danielle Dess    Family History  Problem Relation Age of Onset  . Diabetes Mother   . Lung cancer Brother     twin brother  . Colon cancer Neg Hx   . Pancreatic cancer Paternal Aunt     No Known Allergies  Current Outpatient Prescriptions on File Prior to Visit  Medication Sig Dispense Refill  . acetaminophen (TYLENOL) 500 MG tablet Take 1,000 mg by mouth every 6 (six) hours as needed for pain.      Marland Kitchen ALPRAZolam (XANAX) 0.5 MG tablet Take 0.5 mg by mouth at bedtime.      Marland Kitchen atorvastatin (LIPITOR) 10 MG tablet Take 1 tablet (10 mg total) by mouth daily.  90 tablet  0  . bisoprolol (ZEBETA) 10 MG tablet Take 1 tablet (10 mg total) by mouth daily.  90 tablet  1  . clotrimazole (LOTRIMIN) 1 % cream Apply topically 2 (two) times daily. Apply to penis skin twice a day and keep area clean and dry  30 g  0  . DULoxetine (CYMBALTA) 60 MG capsule Take 60 mg by mouth  at bedtime.      Marland Kitchen glucose blood (ONE TOUCH TEST STRIPS) test strip 1 each. Use to check blood sugar once day      . metFORMIN (GLUCOPHAGE) 500 MG tablet TAKE 2 TABLETS (1,000 MG TOTAL) BY MOUTH 2 (TWO) TIMES DAILY WITH A MEAL.  120 tablet  3  . multivitamin (ONE-A-DAY MEN'S) TABS Take 1 tablet by mouth daily.        Marland Kitchen olmesartan-hydrochlorothiazide (BENICAR HCT) 40-25 MG per tablet Take 1 tablet by mouth daily.  90 tablet  0  . ONE TOUCH LANCETS MISC by Does not apply route. Use to check blood sugar once a day       . oxyCODONE (OXY IR/ROXICODONE) 5 MG immediate release tablet Take 1 tablet (5 mg total) by mouth every 6 (six) hours as needed.  40 tablet  0  . pantoprazole (PROTONIX) 40 MG tablet Take 40 mg by mouth daily.      . pantoprazole (PROTONIX) 40 MG tablet TAKE 1 TABLET (40 MG TOTAL) BY MOUTH DAILY.  30 tablet  3   No current facility-administered medications on file prior to visit.    BP 130/70  Pulse 98  Temp(Src) 97.6 F (36.4  C) (Oral)  Resp 16  Wt 256 lb 0.6 oz (116.139 kg)  BMI 35.73 kg/m2  SpO2 98%       Objective:   Physical Exam  Constitutional: He is oriented to person, place, and time. He appears well-developed and well-nourished. No distress.  HENT:  Head: Normocephalic and atraumatic.  Cardiovascular: Normal rate and regular rhythm.   No murmur heard. Pulmonary/Chest: Effort normal and breath sounds normal. No respiratory distress. He has no wheezes. He has no rales. He exhibits no tenderness.  Abdominal: Bowel sounds are normal. He exhibits no distension and no mass. There is no tenderness. There is no rebound and no guarding.  Musculoskeletal: He exhibits no edema.  Lymphadenopathy:    He has no cervical adenopathy.  Neurological: He is alert and oriented to person, place, and time.  Psychiatric: He has a normal mood and affect. His behavior is normal. Judgment and thought content normal.          Assessment & Plan:

## 2012-08-26 NOTE — Assessment & Plan Note (Signed)
BP currently stable on zebeta and benicar HCT.  Obtain bmet.

## 2012-08-26 NOTE — Assessment & Plan Note (Signed)
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  He wants STD testing. Check RPR, HIV, GC/Chlamydia, HSV titers.

## 2012-08-26 NOTE — Telephone Encounter (Signed)
Please advise refill request for xanax? 

## 2012-08-26 NOTE — Patient Instructions (Addendum)
Please complete lab work prior to leaving.   

## 2012-08-27 LAB — HSV 1 ANTIBODY, IGG: HSV 1 Glycoprotein G Ab, IgG: 0.19 IV

## 2012-08-27 LAB — HIV ANTIBODY (ROUTINE TESTING W REFLEX): HIV: NONREACTIVE

## 2012-08-27 LAB — HSV 2 ANTIBODY, IGG: HSV 2 Glycoprotein G Ab, IgG: 5.99 IV — ABNORMAL HIGH

## 2012-08-27 LAB — RPR

## 2012-08-27 LAB — HSV(HERPES SIMPLEX VRS) I + II AB-IGM: Herpes Simplex Vrs I&II-IgM Ab (EIA): 0.94 INDEX

## 2012-08-28 ENCOUNTER — Telehealth: Payer: Self-pay | Admitting: Family

## 2012-08-28 LAB — GC/CHLAMYDIA PROBE AMP, URINE
Chlamydia, Swab/Urine, PCR: NEGATIVE
GC Probe Amp, Urine: NEGATIVE

## 2012-08-28 MED ORDER — VALACYCLOVIR HCL 500 MG PO TABS: ORAL_TABLET | ORAL | Status: DC

## 2012-08-28 NOTE — Telephone Encounter (Signed)
Reviewed lab work.  + HSV2 results discussed with pt. We discussed no cure, but could take suppressive therapy along with condoms to decrease risk of transmission to partner.  Alternative is to treat breakouts.  He opted for latter and requests rx for prn valtrex be sent to his pharmacy.

## 2012-09-08 ENCOUNTER — Other Ambulatory Visit: Payer: Self-pay | Admitting: Family

## 2012-09-11 ENCOUNTER — Other Ambulatory Visit: Payer: Self-pay | Admitting: Family

## 2012-09-14 MED ORDER — OLMESARTAN MEDOXOMIL-HCTZ 40-25 MG PO TABS: 1.0000 | ORAL_TABLET | Freq: Every day | ORAL | Status: DC

## 2012-09-14 NOTE — Telephone Encounter (Signed)
RX sent

## 2012-10-02 ENCOUNTER — Emergency Department (HOSPITAL_BASED_OUTPATIENT_CLINIC_OR_DEPARTMENT_OTHER)
Admission: EM | Admit: 2012-10-02 | Discharge: 2012-10-02 | Disposition: A | Discharge status: Home / Self Care | Payer: 59 | Attending: Emergency Medicine | Admitting: Emergency Medicine

## 2012-10-02 ENCOUNTER — Encounter (HOSPITAL_BASED_OUTPATIENT_CLINIC_OR_DEPARTMENT_OTHER): Payer: Self-pay | Admitting: *Deleted

## 2012-10-02 ENCOUNTER — Ambulatory Visit (INDEPENDENT_AMBULATORY_CARE_PROVIDER_SITE_OTHER): Payer: 59 | Admitting: Internal Medicine

## 2012-10-02 VITALS — BP 118/82 | HR 118 | Temp 97.5°F | Wt 257.0 lb

## 2012-10-02 DIAGNOSIS — Z8739 Personal history of other diseases of the musculoskeletal system and connective tissue: Secondary | ICD-10-CM | POA: Insufficient documentation

## 2012-10-02 DIAGNOSIS — E7800 Pure hypercholesterolemia, unspecified: Secondary | ICD-10-CM | POA: Insufficient documentation

## 2012-10-02 DIAGNOSIS — F3289 Other specified depressive episodes: Secondary | ICD-10-CM | POA: Insufficient documentation

## 2012-10-02 DIAGNOSIS — G4730 Sleep apnea, unspecified: Secondary | ICD-10-CM | POA: Insufficient documentation

## 2012-10-02 DIAGNOSIS — K859 Acute pancreatitis without necrosis or infection, unspecified: Secondary | ICD-10-CM | POA: Insufficient documentation

## 2012-10-02 DIAGNOSIS — K219 Gastro-esophageal reflux disease without esophagitis: Secondary | ICD-10-CM | POA: Insufficient documentation

## 2012-10-02 DIAGNOSIS — F329 Major depressive disorder, single episode, unspecified: Secondary | ICD-10-CM | POA: Insufficient documentation

## 2012-10-02 DIAGNOSIS — I10 Essential (primary) hypertension: Secondary | ICD-10-CM | POA: Insufficient documentation

## 2012-10-02 DIAGNOSIS — E119 Type 2 diabetes mellitus without complications: Secondary | ICD-10-CM | POA: Insufficient documentation

## 2012-10-02 DIAGNOSIS — Z79899 Other long term (current) drug therapy: Secondary | ICD-10-CM | POA: Insufficient documentation

## 2012-10-02 LAB — COMPREHENSIVE METABOLIC PANEL
ALT: 67 U/L — ABNORMAL HIGH (ref 0–53)
Albumin: 4.6 g/dL (ref 3.5–5.2)
Alkaline Phosphatase: 73 U/L (ref 39–117)
Chloride: 96 mEq/L (ref 96–112)
GFR calc Af Amer: 81 mL/min — ABNORMAL LOW (ref >90)
Glucose, Bld: 210 mg/dL — ABNORMAL HIGH (ref 70–99)
Potassium: 4.2 mEq/L (ref 3.5–5.1)
Sodium: 136 mEq/L (ref 135–145)
Total Bilirubin: 0.5 mg/dL (ref 0.3–1.2)
Total Protein: 8.3 g/dL (ref 6.0–8.3)

## 2012-10-02 LAB — CBC WITH DIFFERENTIAL/PLATELET
Eosinophils Absolute: 0.1 10*3/uL (ref 0.0–0.7)
Hemoglobin: 16.4 g/dL (ref 13.0–17.0)
Lymphs Abs: 3 10*3/uL (ref 0.7–4.0)
MCH: 29.9 pg (ref 26.0–34.0)
Monocytes Relative: 8 % (ref 3–12)
Neutro Abs: 7.6 10*3/uL (ref 1.7–7.7)
Neutrophils Relative %: 65 % (ref 43–77)
Platelets: 309 10*3/uL (ref 150–400)
RBC: 5.49 MIL/uL (ref 4.22–5.81)
WBC: 11.6 10*3/uL — ABNORMAL HIGH (ref 4.0–10.5)

## 2012-10-02 LAB — GLUCOSE, CAPILLARY: Glucose-Capillary: 188 mg/dL — ABNORMAL HIGH (ref 70–99)

## 2012-10-02 LAB — URINALYSIS, ROUTINE W REFLEX MICROSCOPIC
Glucose, UA: >1000 mg/dL — AB
Ketones, ur: 15 mg/dL — AB
Leukocytes, UA: NEGATIVE
Nitrite: NEGATIVE
Protein, ur: 100 mg/dL — AB
Urobilinogen, UA: 0.2 mg/dL (ref 0.0–1.0)

## 2012-10-02 LAB — URINE MICROSCOPIC-ADD ON

## 2012-10-02 MED ORDER — HYDROMORPHONE HCL PF 1 MG/ML IJ SOLN
1.0000 mg | Freq: Once | INTRAMUSCULAR | Status: AC
  Administered 2012-10-02: 1 mg via INTRAVENOUS
  Filled 2012-10-02: qty 1

## 2012-10-02 MED ORDER — ONDANSETRON HCL 4 MG/2ML IJ SOLN
4.0000 mg | Freq: Once | INTRAMUSCULAR | Status: AC
  Administered 2012-10-02: 4 mg via INTRAVENOUS
  Filled 2012-10-02: qty 2

## 2012-10-02 MED ORDER — SODIUM CHLORIDE 0.9 % IV SOLN
Freq: Once | INTRAVENOUS | Status: AC
  Administered 2012-10-02: 19:00:00 via INTRAVENOUS

## 2012-10-02 MED ORDER — OXYCODONE-ACETAMINOPHEN 5-325 MG PO TABS: 2.0000 | ORAL_TABLET | ORAL | Status: DC | PRN

## 2012-10-02 MED ORDER — SODIUM CHLORIDE 0.9 % IV SOLN
Freq: Once | INTRAVENOUS | Status: AC
  Administered 2012-10-02: 17:00:00 via INTRAVENOUS

## 2012-10-02 MED ORDER — ONDANSETRON 4 MG PO TBDP: 4.0000 mg | ORAL_TABLET | Freq: Three times a day (TID) | ORAL | Status: DC | PRN

## 2012-10-02 MED ORDER — FAMOTIDINE IN NACL 20-0.9 MG/50ML-% IV SOLN
20.0000 mg | Freq: Once | INTRAVENOUS | Status: AC
  Administered 2012-10-02: 20 mg via INTRAVENOUS
  Filled 2012-10-02: qty 50

## 2012-10-02 MED ORDER — SODIUM CHLORIDE 0.9 % IV SOLN
Freq: Once | INTRAVENOUS | Status: AC
  Administered 2012-10-02: 18:00:00 via INTRAVENOUS

## 2012-10-02 NOTE — Patient Instructions (Addendum)
Go to the  [facility], corner of [address]  [place], [place] [address] [phone]

## 2012-10-02 NOTE — Progress Notes (Signed)
  Subjective:    Patient ID: Willie [name], male    DOB: 04-08-1976, 37 y.o.   MRN: [id number]  HPI Acute  visit Developed upper, bilateral abdominal pain this morning, it was as high as 8/10. Now 4/10 He got sick early but did not vomit. Has taken Tylenol and ibuprofen. Pain is similar to previous episodes of pancreatitis. Admits to continue  drinking, had 6 beers 2 days ago, 4 beers  yesterday, none today.  Past Medical History  Diagnosis Date  . Alcoholic pancreatitis     recurrent  . Diabetes mellitus, type II     New Onset 03/2010  . Hypertension   . 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 High cholesterol   . Sleep apnea   . GERD (gastroesophageal reflux disease)   . Depression    Past Surgical History  Procedure Laterality Date  . Lumbar microdiscectomy  ~ 2004    Dr Danielle Dess     Review of Systems Denies fever or chills. No chest pain or shortness of breath although the pain increases with deep breaths. Had diarrhea this morning, so some blood in the stools 2 days ago but none today. CBG this morning in the 150s.     Objective:   Physical Exam BP 118/82  Pulse 118  Temp(Src) 97.5 F (36.4 C) (Oral)  Wt 257 lb (116.574 kg)  BMI 35.86 kg/m2  SpO2 93%  General -- alert, well-developed, Got nauseated during the examination, diaphoretic, pulse 118. .   Lungs -- normal respiratory effort, no intercostal retractions, no accessory muscle use, and normal breath sounds.   Heart-- tachycardic no murmur .   Abdomen-- Not distended, tender in the upper abdomen bilaterally, no mass or rebound. + Bowel sounds.  Extremities-- no pretibial edema bilaterally  Neurologic-- alert & oriented X3 and strength normal in all extremities. Psych-- Cognition and judgment appear intact. Alert and cooperative with normal attention span and concentration.  not anxious appearing and not depressed appearing.        Assessment & Plan:    37 year old gentleman, ongoing alcoholism Presents with abdominal pain similar to previous pancreatitis episodes. He is nauseated, tender at the upper abdomen. His diaphoretic and tachycardic. Advise patient to go to the ER via ambulance. He  strongly declined an ambulance. I called the ER, talk with the triage nurse, discuss the case with her. Pt states  will go to the ER now.

## 2012-10-02 NOTE — ED Provider Notes (Signed)
History     CSN: [id number]  Arrival date & time 10/02/12  1613   First MD Initiated Contact with Patient 10/02/12 1706      Chief Complaint  Patient presents with  . Abdominal Pain    (Consider location/radiation/quality/duration/timing/severity/associated sxs/prior treatment) Patient is a 37 y.o. male presenting with abdominal pain. The history is provided by the patient. No language interpreter was used.  Abdominal Pain This is a new problem. The current episode started in the past 7 days. The problem occurs constantly. The problem has been gradually worsening. Associated symptoms include abdominal pain. Nothing aggravates the symptoms. He has tried nothing for the symptoms. The treatment provided no relief.  Pt reports he has a history of pancreatitis.  Pt reports he has had multiple times in the past.    Past Medical History  Diagnosis Date  . Alcoholic pancreatitis     recurrent  . Diabetes mellitus, type II     New Onset 03/2010  . Hypertension   . 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 High cholesterol   . Sleep apnea   . GERD (gastroesophageal reflux disease)   . Depression     Past Surgical History  Procedure Laterality Date  . Lumbar microdiscectomy  ~ 2004    Dr Danielle Dess    Family History  Problem Relation Age of Onset  . Diabetes Mother   . Lung cancer Brother     twin brother  . Colon cancer Neg Hx   . Pancreatic cancer Paternal Aunt     History  Substance Use Topics  . Smoking status: Never Smoker   . Smokeless tobacco: Never Used  . Alcohol Use: 9.0 oz/week    15 Cans of beer per week     Comment: 08/04/2012 "maybe 15 beers/wk; I don't drink qd"      Review of Systems  Gastrointestinal: Positive for abdominal pain.  All other systems reviewed and are negative.    Allergies  Review of patient's allergies indicates no known allergies.  Home Medications   Current Outpatient Rx  Name   Route  Sig  Dispense  Refill  . acetaminophen (TYLENOL) 500 MG tablet   Oral   Take 1,000 mg by mouth every 6 (six) hours as needed for pain.         Marland Kitchen ALPRAZolam (XANAX) 0.5 MG tablet   Oral   Take 1 tablet (0.5 mg total) by mouth at bedtime.   45 tablet   0   . atorvastatin (LIPITOR) 10 MG tablet   Oral   Take 1 tablet (10 mg total) by mouth daily.   90 tablet   0   . bisoprolol (ZEBETA) 10 MG tablet   Oral   Take 1 tablet (10 mg total) by mouth daily.   90 tablet   1   . DULoxetine (CYMBALTA) 60 MG capsule   Oral   Take 60 mg by mouth at bedtime.         Marland Kitchen glucose blood (ONE TOUCH TEST STRIPS) test strip      1 each. Use to check blood sugar once day         . metFORMIN (GLUCOPHAGE) 500 MG tablet      TAKE 2 TABLETS (1,000 MG TOTAL) BY MOUTH 2 (TWO) TIMES DAILY WITH A MEAL.   120 tablet   3   . multivitamin (ONE-A-DAY MEN'S) TABS   Oral  Take 1 tablet by mouth daily.           Marland Kitchen olmesartan-hydrochlorothiazide (BENICAR HCT) 40-25 MG per tablet   Oral   Take 1 tablet by mouth daily.   90 tablet   1   . ONE TOUCH LANCETS MISC   Does not apply   by Does not apply route. Use to check blood sugar once a day          . pantoprazole (PROTONIX) 40 MG tablet   Oral   Take 40 mg by mouth daily.         . valACYclovir (VALTREX) 500 MG tablet      One tablet twice daily for 3 days. Start at first sign of breakout.   12 tablet   5     BP 137/82  Pulse 112  Temp(Src) 98.1 F (36.7 C) (Oral)  Resp 20  Ht 6' (1.829 m)  Wt 257 lb (116.574 kg)  BMI 34.85 kg/m2  SpO2 100%  Physical Exam  Nursing note and vitals reviewed. Constitutional: He is oriented to person, place, and time. He appears well-developed and well-nourished.  HENT:  Head: Normocephalic.  Right Ear: External ear normal.  Left Ear: External ear normal.  Mouth/Throat: Oropharynx is clear and moist.  Eyes: Conjunctivae and EOM are normal. Pupils are equal, round, and reactive to  light.  Neck: Normal range of motion. Neck supple.  Cardiovascular: Normal rate and normal heart sounds.   Pulmonary/Chest: Effort normal.  Abdominal: Soft. There is tenderness. There is no rebound and no guarding.  Musculoskeletal: Normal range of motion.  Neurological: He is alert and oriented to person, place, and time. He has normal reflexes.  Skin: Skin is warm.    ED Course  Procedures (including critical care time)  Labs Reviewed  URINALYSIS, ROUTINE W REFLEX MICROSCOPIC - Abnormal; Notable for the following:    Color, Urine AMBER (*)    Specific Gravity, Urine 1.045 (*)    Glucose, UA >1000 (*)    Bilirubin Urine SMALL (*)    Ketones, ur 15 (*)    Protein, ur 100 (*)    All other components within normal limits  URINE MICROSCOPIC-ADD ON - Abnormal; Notable for the following:    Bacteria, UA FEW (*)    Casts GRANULAR CAST (*)    All other components within normal limits  GLUCOSE, CAPILLARY - Abnormal; Notable for the following:    Glucose-Capillary 188 (*)    All other components within normal limits  CBC WITH DIFFERENTIAL - Abnormal; Notable for the following:    WBC 11.6 (*)    All other components within normal limits  COMPREHENSIVE METABOLIC PANEL - Abnormal; Notable for the following:    Glucose, Bld 210 (*)    AST 44 (*)    ALT 67 (*)    GFR calc non Af Amer 70 (*)    GFR calc Af Amer 81 (*)    All other components within normal limits  LIPASE, BLOOD - Abnormal; Notable for the following:    Lipase 434 (*)    All other components within normal limits   No results found.   1. Pancreatitis       MDM  Pt given IV fluids x 2 liters,  IV pain medications.   Pt not vomitting, can tolerate po fluids.   Pt does not want admission.  Pt reports his wife is Charity fundraiser and he will return if symptoms worsen.   Pt reports he usually  gets better with clear liquids and pain medications          Elson Areas, [place] 10/02/12 1939

## 2012-10-02 NOTE — ED Notes (Signed)
Was sent from Dr Drue Novel office for c.o abdominal pain. Hx of pancreatitis.

## 2012-10-03 ENCOUNTER — Encounter: Payer: Self-pay | Admitting: Internal Medicine

## 2012-10-03 ENCOUNTER — Emergency Department (HOSPITAL_BASED_OUTPATIENT_CLINIC_OR_DEPARTMENT_OTHER)
Admission: EM | Admit: 2012-10-03 | Discharge: 2012-10-03 | Disposition: A | Discharge status: Home / Self Care | Payer: 59 | Attending: Emergency Medicine | Admitting: Emergency Medicine

## 2012-10-03 ENCOUNTER — Emergency Department (HOSPITAL_BASED_OUTPATIENT_CLINIC_OR_DEPARTMENT_OTHER): Payer: 59

## 2012-10-03 ENCOUNTER — Encounter (HOSPITAL_BASED_OUTPATIENT_CLINIC_OR_DEPARTMENT_OTHER): Payer: Self-pay

## 2012-10-03 DIAGNOSIS — K859 Acute pancreatitis without necrosis or infection, unspecified: Secondary | ICD-10-CM | POA: Insufficient documentation

## 2012-10-03 DIAGNOSIS — F329 Major depressive disorder, single episode, unspecified: Secondary | ICD-10-CM | POA: Insufficient documentation

## 2012-10-03 DIAGNOSIS — E119 Type 2 diabetes mellitus without complications: Secondary | ICD-10-CM | POA: Insufficient documentation

## 2012-10-03 DIAGNOSIS — Z79899 Other long term (current) drug therapy: Secondary | ICD-10-CM | POA: Insufficient documentation

## 2012-10-03 DIAGNOSIS — F3289 Other specified depressive episodes: Secondary | ICD-10-CM | POA: Insufficient documentation

## 2012-10-03 DIAGNOSIS — M549 Dorsalgia, unspecified: Secondary | ICD-10-CM | POA: Insufficient documentation

## 2012-10-03 DIAGNOSIS — Z8739 Personal history of other diseases of the musculoskeletal system and connective tissue: Secondary | ICD-10-CM | POA: Insufficient documentation

## 2012-10-03 DIAGNOSIS — K219 Gastro-esophageal reflux disease without esophagitis: Secondary | ICD-10-CM | POA: Insufficient documentation

## 2012-10-03 DIAGNOSIS — G4730 Sleep apnea, unspecified: Secondary | ICD-10-CM | POA: Insufficient documentation

## 2012-10-03 DIAGNOSIS — R110 Nausea: Secondary | ICD-10-CM | POA: Insufficient documentation

## 2012-10-03 DIAGNOSIS — R61 Generalized hyperhidrosis: Secondary | ICD-10-CM | POA: Insufficient documentation

## 2012-10-03 DIAGNOSIS — I10 Essential (primary) hypertension: Secondary | ICD-10-CM | POA: Insufficient documentation

## 2012-10-03 DIAGNOSIS — E7800 Pure hypercholesterolemia, unspecified: Secondary | ICD-10-CM | POA: Insufficient documentation

## 2012-10-03 LAB — COMPREHENSIVE METABOLIC PANEL
AST: 39 U/L — ABNORMAL HIGH (ref 0–37)
BUN: 12 mg/dL (ref 6–23)
CO2: 29 mEq/L (ref 19–32)
Chloride: 92 mEq/L — ABNORMAL LOW (ref 96–112)
Creatinine, Ser: 1.1 mg/dL (ref 0.50–1.35)
GFR calc Af Amer: >90 mL/min (ref >90)
GFR calc non Af Amer: 86 mL/min — ABNORMAL LOW (ref >90)
Glucose, Bld: 238 mg/dL — ABNORMAL HIGH (ref 70–99)
Total Bilirubin: 0.7 mg/dL (ref 0.3–1.2)

## 2012-10-03 LAB — CBC WITH DIFFERENTIAL/PLATELET
HCT: 43.7 % (ref 39.0–52.0)
Hemoglobin: 15.7 g/dL (ref 13.0–17.0)
Lymphocytes Relative: 12 % (ref 12–46)
MCV: 84.4 fL (ref 78.0–100.0)
Monocytes Absolute: 0.7 10*3/uL (ref 0.1–1.0)
Monocytes Relative: 7 % (ref 3–12)
Neutro Abs: 7.6 10*3/uL (ref 1.7–7.7)
WBC: 9.5 10*3/uL (ref 4.0–10.5)

## 2012-10-03 LAB — LIPASE, BLOOD: Lipase: 766 U/L — ABNORMAL HIGH (ref 11–59)

## 2012-10-03 LAB — TROPONIN I: Troponin I: <0.3 ng/mL (ref <0.30)

## 2012-10-03 LAB — GLUCOSE, CAPILLARY

## 2012-10-03 MED ORDER — SODIUM CHLORIDE 0.9 % IV SOLN
INTRAVENOUS | Status: DC
  Administered 2012-10-03: 13:00:00 via INTRAVENOUS

## 2012-10-03 MED ORDER — ONDANSETRON HCL 4 MG/2ML IJ SOLN
4.0000 mg | Freq: Once | INTRAMUSCULAR | Status: AC
  Administered 2012-10-03: 4 mg via INTRAVENOUS
  Filled 2012-10-03: qty 2

## 2012-10-03 MED ORDER — SODIUM CHLORIDE 0.9 % IV BOLUS (SEPSIS)
250.0000 mL | Freq: Once | INTRAVENOUS | Status: AC
  Administered 2012-10-03: 250 mL via INTRAVENOUS

## 2012-10-03 MED ORDER — HYDROMORPHONE HCL PF 1 MG/ML IJ SOLN
1.0000 mg | Freq: Once | INTRAMUSCULAR | Status: AC
  Administered 2012-10-03: 1 mg via INTRAVENOUS
  Filled 2012-10-03: qty 1

## 2012-10-03 MED ORDER — HYDROMORPHONE HCL 2 MG PO TABS: 2.0000 mg | ORAL_TABLET | ORAL | Status: DC | PRN

## 2012-10-03 MED ORDER — IOHEXOL 300 MG/ML  SOLN
50.0000 mL | Freq: Once | INTRAMUSCULAR | Status: AC | PRN
  Administered 2012-10-03: 50 mL via ORAL

## 2012-10-03 MED ORDER — IOHEXOL 300 MG/ML  SOLN
125.0000 mL | Freq: Once | INTRAMUSCULAR | Status: AC | PRN
  Administered 2012-10-03: 125 mL via INTRAVENOUS

## 2012-10-03 MED ORDER — SODIUM CHLORIDE 0.9 % IV BOLUS (SEPSIS)
1000.0000 mL | Freq: Once | INTRAVENOUS | Status: AC
  Administered 2012-10-03: 1000 mL via INTRAVENOUS

## 2012-10-03 MED ORDER — HYDROMORPHONE HCL PF 1 MG/ML IJ SOLN
INTRAMUSCULAR | Status: AC
  Administered 2012-10-03: 1 mg via INTRAVENOUS
  Filled 2012-10-03: qty 1

## 2012-10-03 MED ORDER — HYDROMORPHONE HCL PF 1 MG/ML IJ SOLN: 1.0000 mg | Freq: Once | INTRAMUSCULAR | Status: AC

## 2012-10-03 NOTE — ED Provider Notes (Signed)
History     CSN: [id number]  Arrival date & time 10/03/12  1001   First MD Initiated Contact with Patient 10/03/12 1009      Chief Complaint  Patient presents with  . Abdominal Pain    (Consider location/radiation/quality/duration/timing/severity/associated sxs/prior treatment) Patient is a 37 y.o. male presenting with abdominal pain. The history is provided by the patient.  Abdominal Pain Associated symptoms include abdominal pain. Pertinent negatives include no chest pain, no headaches and no shortness of breath.   patient with history of pancreatitis seen yesterday and diagnosed as such. Patient back today for worsening pain. Stained pain medication he was sent home with oxycodone is having trouble controlling the pain. His only been drinking water but was confused about the recommendation for liquids. Some nausea but no further vomiting. Appetite is decreased but he is tolerating liquids fine. Patient's pain is currently 10 out of 10 epigastric area radiating to the back. Described as a sharp ache. Not made worse or better by anything. Patient was taking the oxycodone to frequently precautions provided for that.  Past Medical History  Diagnosis Date  . Alcoholic pancreatitis     recurrent  . Diabetes mellitus, type II     New Onset 03/2010  . Hypertension   . 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 High cholesterol   . Sleep apnea   . GERD (gastroesophageal reflux disease)   . Depression     Past Surgical History  Procedure Laterality Date  . Lumbar microdiscectomy  ~ 2004    Dr Danielle Dess    Family History  Problem Relation Age of Onset  . Diabetes Mother   . Lung cancer Brother     twin brother  . Colon cancer Neg Hx   . Pancreatic cancer Paternal Aunt     History  Substance Use Topics  . Smoking status: Never Smoker   . Smokeless tobacco: Never Used  . Alcohol Use: 9.0 oz/week    15 Cans of beer per week      Comment: 08/04/2012 "maybe 15 beers/wk; I don't drink qd"      Review of Systems  Constitutional: Positive for diaphoresis and appetite change.  HENT: Negative for neck pain.   Eyes: Negative for visual disturbance.  Respiratory: Negative for shortness of breath.   Cardiovascular: Negative for chest pain.  Gastrointestinal: Positive for nausea and abdominal pain. Negative for vomiting.  Genitourinary: Negative for hematuria.  Musculoskeletal: Positive for back pain.  Skin: Negative for rash.  Neurological: Negative for headaches.  Hematological: Does not bruise/bleed easily.  Psychiatric/Behavioral: Negative for confusion.    Allergies  Review of patient's allergies indicates no known allergies.  Home Medications   Current Outpatient Rx  Name  Route  Sig  Dispense  Refill  . acetaminophen (TYLENOL) 500 MG tablet   Oral   Take 1,000 mg by mouth every 6 (six) hours as needed for pain.         Marland Kitchen ALPRAZolam (XANAX) 0.5 MG tablet   Oral   Take 1 tablet (0.5 mg total) by mouth at bedtime.   45 tablet   0   . atorvastatin (LIPITOR) 10 MG tablet   Oral   Take 1 tablet (10 mg total) by mouth daily.   90 tablet   0   . bisoprolol (ZEBETA) 10 MG tablet   Oral   Take 1 tablet (10 mg total) by mouth daily.  90 tablet   1   . DULoxetine (CYMBALTA) 60 MG capsule   Oral   Take 60 mg by mouth at bedtime.         Marland Kitchen glucose blood (ONE TOUCH TEST STRIPS) test strip      1 each. Use to check blood sugar once day         . metFORMIN (GLUCOPHAGE) 500 MG tablet      TAKE 2 TABLETS (1,000 MG TOTAL) BY MOUTH 2 (TWO) TIMES DAILY WITH A MEAL.   120 tablet   3   . multivitamin (ONE-A-DAY MEN'S) TABS   Oral   Take 1 tablet by mouth daily.           Marland Kitchen olmesartan-hydrochlorothiazide (BENICAR HCT) 40-25 MG per tablet   Oral   Take 1 tablet by mouth daily.   90 tablet   1   . ondansetron (ZOFRAN ODT) 4 MG disintegrating tablet   Oral   Take 1 tablet (4 mg total) by  mouth every 8 (eight) hours as needed for nausea.   20 tablet   0   . ONE TOUCH LANCETS MISC   Does not apply   by Does not apply route. Use to check blood sugar once a day          . oxyCODONE-acetaminophen (PERCOCET/ROXICET) 5-325 MG per tablet   Oral   Take 2 tablets by mouth every 4 (four) hours as needed for pain.   20 tablet   0   . pantoprazole (PROTONIX) 40 MG tablet   Oral   Take 40 mg by mouth daily.         . valACYclovir (VALTREX) 500 MG tablet      One tablet twice daily for 3 days. Start at first sign of breakout.   12 tablet   5   . HYDROmorphone (DILAUDID) 2 MG tablet   Oral   Take 1 tablet (2 mg total) by mouth every 4 (four) hours as needed for pain.   30 tablet   0     BP 140/89  Pulse 81  Temp(Src) 97.4 F (36.3 C) (Oral)  Resp 13  Ht 6' (1.829 m)  Wt 257 lb (116.574 kg)  BMI 34.85 kg/m2  SpO2 91%  Physical Exam  Nursing note and vitals reviewed. Constitutional: He is oriented to person, place, and time. He appears well-developed and well-nourished. He appears distressed.  HENT:  Head: Normocephalic and atraumatic.  Mouth/Throat: Oropharynx is clear and moist.  Eyes: Conjunctivae and EOM are normal. Pupils are equal, round, and reactive to light.  Neck: Normal range of motion.  Cardiovascular: Normal rate, regular rhythm and normal heart sounds.   No murmur heard. Abdominal: Soft. Bowel sounds are normal. There is tenderness.  Epigastric tenderness. No guarding.  Musculoskeletal: Normal range of motion. He exhibits no edema.  Neurological: He is alert and oriented to person, place, and time. No cranial nerve deficit. He exhibits normal muscle tone. Coordination normal.  Skin: Skin is warm. No rash noted.    ED Course  Procedures (including critical care time)  Labs Reviewed  GLUCOSE, CAPILLARY - Abnormal; Notable for the following:    Glucose-Capillary 230 (*)    All other components within normal limits  CBC WITH DIFFERENTIAL  - Abnormal; Notable for the following:    Neutrophils Relative % 80 (*)    All other components within normal limits  COMPREHENSIVE METABOLIC PANEL - Abnormal; Notable for the following:    Sodium  132 (*)    Chloride 92 (*)    Glucose, Bld 238 (*)    AST 39 (*)    ALT 57 (*)    GFR calc non Af Amer 86 (*)    All other components within normal limits  LIPASE, BLOOD - Abnormal; Notable for the following:    Lipase 766 (*)    All other components within normal limits  TROPONIN I   Ct Abdomen Pelvis W Contrast  10/03/2012   *RADIOLOGY REPORT*  Clinical Data: Epigastric pain for 2 days.  History of pancreatitis.  White count of 0.6.  CT ABDOMEN AND PELVIS WITH CONTRAST  Technique:  Multidetector CT imaging of the abdomen and pelvis was performed following the standard protocol during bolus administration of intravenous contrast.  Contrast: 50mL OMNIPAQUE IOHEXOL 300 MG/ML  SOLN, OMNIPAQUE IOHEXOL 300 MG/ML  SOLN 125 ml Omnipaque-300  Comparison: CT of the abdomen and pelvis 05/18/2012  Findings: Lung bases are negative.  There is fatty infiltration of the liver.  Strandy retroperitoneal densities are identified in the peri pancreatic region, consistent with pancreatic exudates in the setting of pancreatitis.  No focal pancreatic lesions or pseudocysts are identified.  The pancreatic enhancement is normal.  The small left upper quadrant splenule measures 1.7 cm, similar appearance to prior study.  The spleen, adrenal glands, and kidneys have a normal appearance. The gallbladder is present.  The stomach and small bowel loops are normal in appearance. Multiple colonic diverticula are present.  There is stranding surrounding the splenic flexure, felt to be secondary to pancreatitis.  There is no free intraperitoneal air or significant evidence for acute diverticulitis. The appendix is well seen and has a normal appearance.  No retroperitoneal or mesenteric adenopathy. No evidence for aortic aneurysm.  Visualized osseous structures have a normal appearance.  IMPRESSION: 1.  Peripancreatic stranding is diffuse and consistent with pancreatitis.  No evidence for pseudocyst, abscess, or devascularization of the pancreas. 2.  Fatty liver.   Original Report Authenticated By: Norva Pavlov, M.D.   Results for orders placed during the hospital encounter of 10/03/12  GLUCOSE, CAPILLARY      Result Value Range   Glucose-Capillary 230 (*) 70 - 99 mg/dL   Comment 1 Notify RN     Comment 2 Documented in Chart    CBC WITH DIFFERENTIAL      Result Value Range   WBC 9.5  4.0 - 10.5 K/uL   RBC 5.18  4.22 - 5.81 MIL/uL   Hemoglobin 15.7  13.0 - 17.0 g/dL   HCT 16.1  09.6 - 04.5 %   MCV 84.4  78.0 - 100.0 fL   MCH 30.3  26.0 - 34.0 pg   MCHC 35.9  30.0 - 36.0 g/dL   RDW 40.9  81.1 - 91.4 %   Platelets 272  150 - 400 K/uL   Neutrophils Relative % 80 (*) 43 - 77 %   Neutro Abs 7.6  1.7 - 7.7 K/uL   Lymphocytes Relative 12  12 - 46 %   Lymphs Abs 1.1  0.7 - 4.0 K/uL   Monocytes Relative 7  3 - 12 %   Monocytes Absolute 0.7  0.1 - 1.0 K/uL   Eosinophils Relative 1  0 - 5 %   Eosinophils Absolute 0.1  0.0 - 0.7 K/uL   Basophils Relative 0  0 - 1 %   Basophils Absolute 0.0  0.0 - 0.1 K/uL  COMPREHENSIVE METABOLIC PANEL  Result Value Range   Sodium 132 (*) 135 - 145 mEq/L   Potassium 3.9  3.5 - 5.1 mEq/L   Chloride 92 (*) 96 - 112 mEq/L   CO2 29  19 - 32 mEq/L   Glucose, Bld 238 (*) 70 - 99 mg/dL   BUN 12  6 - 23 mg/dL   Creatinine, Ser 8.29  0.50 - 1.35 mg/dL   Calcium 9.4  8.4 - 56.2 mg/dL   Total Protein 7.7  6.0 - 8.3 g/dL   Albumin 4.2  3.5 - 5.2 g/dL   AST 39 (*) 0 - 37 U/L   ALT 57 (*) 0 - 53 U/L   Alkaline Phosphatase 68  39 - 117 U/L   Total Bilirubin 0.7  0.3 - 1.2 mg/dL   GFR calc non Af Amer 86 (*) >90 mL/min   GFR calc Af Amer >90  >90 mL/min  LIPASE, BLOOD      Result Value Range   Lipase 766 (*) 11 - 59 U/L  TROPONIN I      Result Value Range   Troponin I <0.30  <0.30  ng/mL     1. Pancreatitis       MDM  Patient seen here yesterday diagnosed with acute pancreatitis. Returns today for worse pain. Patient's lipase is now in the upper 700s. So most doubled since yesterday. Blood sugar those not too badly controlled at 238 no signs of acidosis. CT scan of the abdomen shows no complicating factors of pancreatitis such as pseudocyst or abscess. However patient's electrolytes of sodium a little low chloride a low treated with normal saline here given more than a liter of fluid. Pain brought under better control still recommended admission due to the patient's elevating lipase and history of diabetes. BUN and creatinine are still normal. Patient refuses admission and her stance potential consequences to include death. Patient is reviewed admissions in the past according to past notes. He will followup with his primary care doctor on Monday. We'll return for any new or worse symptoms. We'll supplement his current pain medication with the hydromorphone orally. Patient did improve with fluids and pain medicine in ED.       Shelda Jakes, MD 10/03/12 [phone]

## 2012-10-03 NOTE — ED Notes (Signed)
Pt states that he was seen yesterday and dx with pancreatitis, states that he has been taking percocet with no relief, diaphoretic.  Pt c/o anorexia, has been tolerating PO liquids.  C/o severe abd pain and reports taking 2 tabs of Roxicet 5/325 every 2 hours instead of every 4 hours.

## 2012-10-03 NOTE — ED Notes (Signed)
Patient transported to CT 

## 2012-10-03 NOTE — ED Provider Notes (Signed)
Medical screening examination/treatment/procedure(s) were performed by non-physician practitioner and as supervising physician I was immediately available for consultation/collaboration.    Gilda Crease, MD 10/03/12 1550

## 2012-10-04 ENCOUNTER — Telehealth: Payer: Self-pay | Admitting: Family

## 2012-10-04 NOTE — Telephone Encounter (Signed)
Please call pt and arrange ED follow up for 6/9.  Ok to use two 15 minute slots. Thanks.

## 2012-10-05 ENCOUNTER — Ambulatory Visit (INDEPENDENT_AMBULATORY_CARE_PROVIDER_SITE_OTHER): Payer: 59 | Admitting: Family

## 2012-10-05 ENCOUNTER — Telehealth: Payer: Self-pay | Admitting: Family

## 2012-10-05 ENCOUNTER — Encounter: Payer: Self-pay | Admitting: Family

## 2012-10-05 VITALS — BP 120/80 | HR 76 | Temp 97.8°F | Resp 16 | Ht 70.5 in | Wt 251.1 lb

## 2012-10-05 DIAGNOSIS — F341 Dysthymic disorder: Secondary | ICD-10-CM

## 2012-10-05 DIAGNOSIS — K859 Acute pancreatitis without necrosis or infection, unspecified: Secondary | ICD-10-CM

## 2012-10-05 DIAGNOSIS — F418 Other specified anxiety disorders: Secondary | ICD-10-CM

## 2012-10-05 LAB — CBC WITH DIFFERENTIAL/PLATELET
Basophils Absolute: 0 10*3/uL (ref 0.0–0.1)
Eosinophils Absolute: 0.2 10*3/uL (ref 0.0–0.7)
Eosinophils Relative: 2 % (ref 0–5)
Lymphocytes Relative: 28 % (ref 12–46)
MCV: 83.3 fL (ref 78.0–100.0)
Platelets: 329 10*3/uL (ref 150–400)
RDW: 14.8 % (ref 11.5–15.5)
WBC: 7.8 10*3/uL (ref 4.0–10.5)

## 2012-10-05 MED ORDER — ALPRAZOLAM 0.5 MG PO TABS: ORAL_TABLET | ORAL | Status: DC

## 2012-10-05 MED ORDER — OXYCODONE HCL 5 MG PO CAPS: 5.0000 mg | ORAL_CAPSULE | ORAL | Status: DC | PRN

## 2012-10-05 NOTE — Telephone Encounter (Signed)
He is using percocet.  He needs without tylenol because they make him bleed.  He needs this refill as we could not get him in for hospital follow up till Friday (per Nicki [place])

## 2012-10-05 NOTE — Progress Notes (Signed)
Subjective:    Patient ID: Willie [name], male    DOB: [birth year], 37 y.o.   MRN: [id number]  HPI  Mr. Cott is a 37 yr old male who presents today for ED follow up of his pancreatitis. Records are reviewed. He was initially seen 6/6 due to abdominal pain.  He was given rx for percocet and sent home.  He returned to the ED on 6/7due to uncontrolled abdominal pain.  His lipase was noted to be increased from 434 to 766.  Admission was recommended.  He declined ad mission and was given rx for dilaudid.  CT abdomen pelvis was performed and it noted peripancreatic stranding.   He is drinking water and gatorade only. He is not taking any solids.  Today his abdominal pain is improved from Saturday.  Was 10/10 now down to 5/10.  Denies known fever but does report + sweating.   Separated from his wife. Reports that he drinks 2-3 times a week.  Goes to AA.  Has been meeting with divorce lawyer and after these meetings he becomes very upset and tends to turn to alcohol.   Review of Systems See HPI  Past Medical History  Diagnosis Date  . Alcoholic pancreatitis     recurrent  . Diabetes mellitus, type II     New Onset 03/2010  . Hypertension   . 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 High cholesterol   . Sleep apnea   . GERD (gastroesophageal reflux disease)   . Depression     History   Social History  . Marital Status: Married    Spouse Name: N/A    Number of Children: 2  . 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 Smoking status: Never Smoker   . Smokeless tobacco: Never Used  . Alcohol Use: 9.0 oz/week    15 Cans of beer per week     Comment: 08/04/2012 "maybe 15 beers/wk; I don't drink qd"  . Drug Use: No  . Sexually Active: Yes   Other Topics Concern  . 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 Lumbar microdiscectomy  ~ 2004    Dr Danielle Dess    Family History  Problem Relation Age of Onset  . Diabetes Mother   . Lung cancer Brother     twin brother  . Colon cancer Neg Hx   . Pancreatic cancer Paternal Aunt     No Known Allergies  Current Outpatient Prescriptions on File Prior to Visit  Medication Sig Dispense Refill  . acetaminophen (TYLENOL) 500 MG tablet Take 1,000 mg by mouth every 6 (six) hours as needed for pain.      Marland Kitchen atorvastatin (LIPITOR) 10 MG tablet Take 1 tablet (10 mg total) by mouth daily.  90 tablet  0  . bisoprolol (ZEBETA) 10 MG tablet Take 1 tablet (10 mg total) by mouth daily.  90 tablet  1  . DULoxetine (CYMBALTA) 60 MG capsule Take 60 mg by mouth  at bedtime.      Marland Kitchen glucose blood (ONE TOUCH TEST STRIPS) test strip 1 each. Use to check blood sugar once day      . metFORMIN (GLUCOPHAGE) 500 MG tablet TAKE 2 TABLETS (1,000 MG TOTAL) BY MOUTH 2 (TWO) TIMES DAILY WITH A MEAL.  120 tablet  3  . multivitamin (ONE-A-DAY MEN'S) TABS Take 1 tablet by mouth daily.        Marland Kitchen olmesartan-hydrochlorothiazide (BENICAR HCT) 40-25 MG per tablet Take 1 tablet by mouth daily.  90 tablet  1  . ondansetron (ZOFRAN ODT) 4 MG disintegrating tablet Take 1 tablet (4 mg total) by mouth every 8 (eight) hours as needed for nausea.  20 tablet  0  . ONE TOUCH LANCETS MISC by Does not apply route. Use to check blood sugar once a day       . oxyCODONE-acetaminophen (PERCOCET/ROXICET) 5-325 MG per tablet Take 2 tablets by mouth every 4 (four) hours as needed for pain.  20 tablet  0  . pantoprazole (PROTONIX) 40 MG tablet Take 40 mg by mouth daily.      . valACYclovir (VALTREX) 500 MG tablet One tablet twice daily for 3 days. Start at first sign of breakout.  12 tablet  5  . HYDROmorphone  (DILAUDID) 2 MG tablet Take 1 tablet (2 mg total) by mouth every 4 (four) hours as needed for pain.  30 tablet  0   No current facility-administered medications on file prior to visit.    BP 120/80  Pulse 76  Temp(Src) 97.8 F (36.6 C) (Oral)  Resp 16  Ht 5' 10.5" (1.791 m)  Wt 251 lb 1.3 oz (113.889 kg)  BMI 35.51 kg/m2  SpO2 99%       Objective:   Physical Exam  Constitutional: He appears well-developed and well-nourished. No distress.  Cardiovascular: Normal rate and regular rhythm.   No murmur heard. Pulmonary/Chest: Effort normal and breath sounds normal. No respiratory distress. He has no wheezes. He has no rales. He exhibits no tenderness.  Abdominal: Soft. Bowel sounds are normal.  Mild epigastric tenderness without guarding.   Musculoskeletal: He exhibits no edema.  Psychiatric: He has a normal mood and affect. His behavior is normal. Judgment and thought content normal.          Assessment & Plan:

## 2012-10-05 NOTE — Telephone Encounter (Signed)
Addressed at today's visit

## 2012-10-05 NOTE — Telephone Encounter (Signed)
Please advise 

## 2012-10-05 NOTE — Patient Instructions (Addendum)
Continue clear liquid diet until you are seen back in the office.  Please follow up on Friday- call sooner if symptoms do not continue to improve.   Clear Liquid Diet The clear liquid dietconsists of foods that are liquid or will become liquid at room temperature.You should be able to see through the liquid and beverages. Examples of foods allowed on a clear liquid diet include fruit juice, broth or bouillon, gelatin, or frozen ice pops. The purpose of this diet is to provide necessary fluid, electrolytes such as sodium and potassium, and energy to keep the body functioning during times when you are not able to consume a regular diet.A clear liquid diet should not be continued for long periods of time as it is not nutritionally adequate.  REASONS FOR USING A CLEAR LIQUID DIET  In sudden onset (acute) conditions for a patient before or after surgery.  As the first step in oral feeding.  For fluid and electrolyte replacement in diarrheal diseases.  As a diet before certain medical tests are performed. ADEQUACY The clear liquid diet is adequate only in ascorbic acid, according to the Recommended Dietary Allowances of the [employer]. CHOOSING FOODS Breads and Starches  Allowed:  None are allowed.  Avoid: All are avoided. Vegetables  Allowed:  Strained tomato or vegetable juice.  Avoid: Any others. Fruit  Allowed:  Strained fruit juices and fruit drinks. Include 1 serving of citrus or vitamin C-enriched fruit juice daily.  Avoid: Any others. Meat and Meat Substitutes  Allowed:  None are allowed.  Avoid: All are avoided. Milk  Allowed:  None are allowed.  Avoid: All are avoided. Soups and Combination Foods  Allowed:  Clear bouillon, broth, or strained broth-based soups.  Avoid: Any others. Desserts and Sweets  Allowed:  Sugar, honey. High protein gelatin. Flavored gelatin, ices, or frozen ice pops that do not contain milk.  Avoid: Any others. Fats and  Oils  Allowed:  None are allowed.  Avoid: All are avoided. Beverages  Allowed: Cereal beverages, coffee (regular or decaffeinated), tea, or soda at the discretion of your caregiver.  Avoid: Any others. Condiments  Allowed:  Iodized salt.  Avoid: Any others, including pepper. Supplements  Allowed:  Liquid nutrition beverages.  Avoid: Any others that contain lactose or fiber. SAMPLE MEAL PLAN Breakfast  4 oz (120 mL) strained orange juice.   to 1 cup (125 to 250 mL) gelatin (plain or fortified).  1 cup (250 mL) beverage (coffee or tea).  Sugar, if desired. Midmorning Snack   cup (125 mL) gelatin (plain or fortified). Lunch  1 cup (250 mL) broth or consomm.  4 oz (120 mL) strained grapefruit juice.   cup (125 mL) gelatin (plain or fortified).  1 cup (250 mL) beverage (coffee or tea).  Sugar, if desired. Midafternoon Snack   cup (125 mL) fruit ice.   cup (125 mL) strained fruit juice. Dinner  1 cup (250 mL) broth or consomm.   cup (125 mL) cranberry juice.   cup (125 mL) flavored gelatin (plain or fortified).  1 cup (250 mL) beverage (coffee or tea).  Sugar, if desired. Evening Snack  4 oz (120 mL) strained apple juice (vitamin C-fortified).   cup (125 mL) flavored gelatin (plain or fortified). Document Released: 04/15/2005 Document Revised: 07/08/2011 Document Reviewed: 07/13/2010 [facility] Patient Information 2014 [place], [place].

## 2012-10-05 NOTE — Telephone Encounter (Signed)
Appointment scheduled for this afternoon at 3:15pm

## 2012-10-06 LAB — BASIC METABOLIC PANEL WITH GFR
CO2: 31 mEq/L (ref 19–32)
Calcium: 9.8 mg/dL (ref 8.4–10.5)
Creat: 0.86 mg/dL (ref 0.50–1.35)

## 2012-10-06 LAB — HEPATIC FUNCTION PANEL
ALT: 121 U/L — ABNORMAL HIGH (ref 0–53)
Bilirubin, Direct: 0.2 mg/dL (ref 0.0–0.3)
Total Protein: 7.8 g/dL (ref 6.0–8.3)

## 2012-10-06 NOTE — Assessment & Plan Note (Signed)
Self medicating with alcohol.  We discussed risk of ETOH use due to recurrent pancreatitis which can be life threatening.  Discussed using prn xanax when anxious rather than turning to ETOH.  Continue cymbalta.

## 2012-10-06 NOTE — Assessment & Plan Note (Signed)
Clinically improving.  Repeat lab work, oxycodone prn pain, continue clear liquids, close follow up.

## 2012-10-09 ENCOUNTER — Ambulatory Visit (INDEPENDENT_AMBULATORY_CARE_PROVIDER_SITE_OTHER): Payer: 59 | Admitting: Family

## 2012-10-09 ENCOUNTER — Encounter: Payer: Self-pay | Admitting: Family

## 2012-10-09 VITALS — BP 110/80 | HR 79 | Temp 97.8°F | Resp 16 | Ht 70.5 in | Wt 258.0 lb

## 2012-10-09 DIAGNOSIS — K859 Acute pancreatitis without necrosis or infection, unspecified: Secondary | ICD-10-CM

## 2012-10-09 DIAGNOSIS — K625 Hemorrhage of anus and rectum: Secondary | ICD-10-CM

## 2012-10-09 LAB — CBC WITH DIFFERENTIAL/PLATELET
Basophils Relative: 0 % (ref 0–1)
Eosinophils Absolute: 0.1 10*3/uL (ref 0.0–0.7)
Eosinophils Relative: 1 % (ref 0–5)
Hemoglobin: 14.1 g/dL (ref 13.0–17.0)
MCH: 29.3 pg (ref 26.0–34.0)
MCHC: 35.1 g/dL (ref 30.0–36.0)
Monocytes Absolute: 0.5 10*3/uL (ref 0.1–1.0)
Monocytes Relative: 6 % (ref 3–12)
Neutrophils Relative %: 68 % (ref 43–77)

## 2012-10-09 LAB — LIPASE: Lipase: 21 U/L (ref 0–75)

## 2012-10-09 LAB — HEPATIC FUNCTION PANEL
Alkaline Phosphatase: 68 U/L (ref 39–117)
Indirect Bilirubin: 0.4 mg/dL (ref 0.0–0.9)
Total Protein: 7 g/dL (ref 6.0–8.3)

## 2012-10-09 NOTE — Assessment & Plan Note (Signed)
Clinically improving. He has advanced his diet.  Repeat lft, lipase to follow up trend.

## 2012-10-09 NOTE — Progress Notes (Signed)
Subjective:    Patient ID: Willie [name], male    DOB: [birth year], 37 y.o.   MRN: [id number]  HPI  Willie [name] is a 37 yr old male who present today for follow up of pancreatitis. Reports that his abdominal pain is improving. Has not taken pain meds in 2 days.  He has progressed his diet.  He reports + rectal bleeding with BM's. Reports that this started prior to his pancreatitis.  Has been intermittent x 1 month.     Review of Systems    see HPI  Past Medical History  Diagnosis Date  . Alcoholic pancreatitis     recurrent  . Diabetes mellitus, type II     New Onset 03/2010  . Hypertension   . 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 High cholesterol   . Sleep apnea   . GERD (gastroesophageal reflux disease)   . Depression     History   Social History  . Marital Status: Married    Spouse Name: N/A    Number of Children: 2  . 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 Smoking status: Never Smoker   . Smokeless tobacco: Never Used  . Alcohol Use: 9.0 oz/week    15 Cans of beer per week     Comment: 08/04/2012 "maybe 15 beers/wk; I don't drink qd"  . Drug Use: No  . Sexually Active: Yes   Other Topics Concern  . 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 Lumbar microdiscectomy  ~ 2004    Dr Danielle Dess    Family History  Problem Relation Age of Onset  . Diabetes Mother   . Lung cancer Brother     twin brother  . Colon cancer Neg Hx   . Pancreatic cancer Paternal Aunt     No Known Allergies  Current Outpatient Prescriptions on File Prior to Visit  Medication Sig Dispense Refill  .  acetaminophen (TYLENOL) 500 MG tablet Take 1,000 mg by mouth every 6 (six) hours as needed for pain.      Marland Kitchen ALPRAZolam (XANAX) 0.5 MG tablet One tablet by mouth twice daily as needed.  60 tablet  0  . atorvastatin (LIPITOR) 10 MG tablet Take 1 tablet (10 mg total) by mouth daily.  90 tablet  0  . bisoprolol (ZEBETA) 10 MG tablet Take 1 tablet (10 mg total) by mouth daily.  90 tablet  1  . DULoxetine (CYMBALTA) 60 MG capsule Take 60 mg by mouth at bedtime.      Marland Kitchen glucose blood (ONE TOUCH TEST STRIPS) test strip 1 each. Use to check blood sugar once day      . metFORMIN (GLUCOPHAGE) 500 MG tablet TAKE 2 TABLETS (1,000 MG TOTAL) BY MOUTH 2 (TWO) TIMES DAILY WITH A MEAL.  120 tablet  3  . multivitamin (ONE-A-DAY MEN'S) TABS Take 1 tablet by mouth daily.        Marland Kitchen olmesartan-hydrochlorothiazide (BENICAR HCT) 40-25 MG per tablet Take  1 tablet by mouth daily.  90 tablet  1  . ONE TOUCH LANCETS MISC by Does not apply route. Use to check blood sugar once a day       . oxycodone (OXY-IR) 5 MG capsule Take 1 capsule (5 mg total) by mouth every 4 (four) hours as needed.  30 capsule  0  . pantoprazole (PROTONIX) 40 MG tablet Take 40 mg by mouth daily.      . valACYclovir (VALTREX) 500 MG tablet One tablet twice daily for 3 days. Start at first sign of breakout.  12 tablet  5  . HYDROmorphone (DILAUDID) 2 MG tablet Take 1 tablet (2 mg total) by mouth every 4 (four) hours as needed for pain.  30 tablet  0  . ondansetron (ZOFRAN ODT) 4 MG disintegrating tablet Take 1 tablet (4 mg total) by mouth every 8 (eight) hours as needed for nausea.  20 tablet  0  . oxyCODONE-acetaminophen (PERCOCET/ROXICET) 5-325 MG per tablet Take 2 tablets by mouth every 4 (four) hours as needed for pain.  20 tablet  0   No current facility-administered medications on file prior to visit.    BP 110/80  Pulse 79  Temp(Src) 97.8 F (36.6 C) (Oral)  Resp 16  Ht 5' 10.5" (1.791 m)  Wt 258 lb 0.6 oz (117.046 kg)  BMI 36.49 kg/m2  SpO2  97%    Objective:   Physical Exam  Gen: awake, alert, NAD CV: s1/S2, RRR Resp: BS CTA bilaterally no wheezes/rales/rhonchi Abd: soft/NT ND, + bowel sounds Psych A and O x 3, calm, pleasant       Assessment & Plan:

## 2012-10-09 NOTE — Assessment & Plan Note (Signed)
Rectal bleeding has been intermittent x 1 month.  Obtain stat CBC. Refer back to GI. Reviewed colo from 2012 and this noted diverticulosis only.  No noted of hemorrhoids. He was having rectal bleeding at that time as well.  He is instructed to go to the ER if he develops increased frequency of bloody stools.  Will refer back to GI for close follow up.

## 2012-10-11 ENCOUNTER — Encounter: Payer: Self-pay | Admitting: Family

## 2012-10-13 ENCOUNTER — Ambulatory Visit: Payer: 59 | Admitting: Physician Assistant

## 2012-10-16 ENCOUNTER — Ambulatory Visit (INDEPENDENT_AMBULATORY_CARE_PROVIDER_SITE_OTHER): Payer: 59 | Admitting: Physician Assistant

## 2012-10-16 ENCOUNTER — Encounter: Payer: Self-pay | Admitting: Physician Assistant

## 2012-10-16 ENCOUNTER — Other Ambulatory Visit: Payer: Self-pay | Admitting: Family

## 2012-10-16 VITALS — BP 118/88 | HR 100 | Ht 71.0 in | Wt 258.5 lb

## 2012-10-16 DIAGNOSIS — K625 Hemorrhage of anus and rectum: Secondary | ICD-10-CM

## 2012-10-16 DIAGNOSIS — K859 Acute pancreatitis without necrosis or infection, unspecified: Secondary | ICD-10-CM

## 2012-10-16 NOTE — Progress Notes (Signed)
I agree with the plan outlined in this note 

## 2012-10-16 NOTE — Patient Instructions (Addendum)
You have been scheduled for a flexible sigmoidoscopy. Please follow the written instructions given to you at your visit today. If you use inhalers (even only as needed), please bring them with you on the day of your procedure.  

## 2012-10-16 NOTE — Telephone Encounter (Signed)
Request for Duloxetine Last Rx: 03.12.14, #30x2 Last OV: 06.13.14 Rx request to pharmacy per [facility] refill protocol/SLS

## 2012-10-16 NOTE — Progress Notes (Signed)
Subjective:    Patient ID: Willie [name], male    DOB: 04/29/1976, 37 y.o.   MRN: [id number]  HPI  Willie [name] is a pleasant 37 year old male known to Dr. Christella Hartigan from prior colonoscopy done in June of 2012. This was done for complaints of intermittent rectal bleeding. He was noted to have left colon diverticulosis but no hemorrhoids. At some mild erythema noted in his descending colon and biopsies were taken to rule out colitis in the showed a benign colonic mucosa . He has history of adult-onset diabetes mellitus, depression and EtOH abuse. He has had problems with recurrent alcohol-related pancreatitis and has had an ER visit on June 7 with abdominal pain. He was found on CT scan at that time to have an acute pancreatitis as well as multiple colon diverticuli. He is referred here today by primary care for evaluation of recurrent intermittent rectal bleeding patient had labs done on 10/09/2012 showing hemoglobin of 14.1 hematocrit 40.2 lipase is 21 AST 38 ALT of 79 Patient states that he has had recurrent bleeding over the past month which is intermittent but occurring almost daily. He says he has  bright red blood mixed in with the bowel movements, in the commode and on the tissue  He denies any rectal pain or discomfort or other hemorrhoidal symptoms. He says he has been having abdominal pain from pancreatitis but has not had any changes in  his bowel habits lower, cramping etc.    Review of Systems  Constitutional: Positive for appetite change.  HENT: Negative.   Eyes: Negative.   Respiratory: Negative.   Cardiovascular: Negative.   Gastrointestinal: Positive for abdominal pain and blood in stool.  Endocrine: Negative.   Genitourinary: Negative.   Musculoskeletal: Negative.   Skin: Negative.   Allergic/Immunologic: Negative.   Neurological: Negative.   Hematological: Negative.   Psychiatric/Behavioral: Negative.    Outpatient Prescriptions Prior to Visit  Medication Sig Dispense  Refill  . ALPRAZolam (XANAX) 0.5 MG tablet One tablet by mouth twice daily as needed.  60 tablet  0  . atorvastatin (LIPITOR) 10 MG tablet Take 1 tablet (10 mg total) by mouth daily.  90 tablet  0  . bisoprolol (ZEBETA) 10 MG tablet Take 1 tablet (10 mg total) by mouth daily.  90 tablet  1  . DULoxetine (CYMBALTA) 60 MG capsule Take 60 mg by mouth at bedtime.      Marland Kitchen glucose blood (ONE TOUCH TEST STRIPS) test strip 1 each. Use to check blood sugar once day      . metFORMIN (GLUCOPHAGE) 500 MG tablet TAKE 2 TABLETS (1,000 MG TOTAL) BY MOUTH 2 (TWO) TIMES DAILY WITH A MEAL.  120 tablet  3  . multivitamin (ONE-A-DAY MEN'S) TABS Take 1 tablet by mouth daily.        Marland Kitchen olmesartan-hydrochlorothiazide (BENICAR HCT) 40-25 MG per tablet Take 1 tablet by mouth daily.  90 tablet  1  . ONE TOUCH LANCETS MISC by Does not apply route. Use to check blood sugar once a day       . oxycodone (OXY-IR) 5 MG capsule Take 1 capsule (5 mg total) by mouth every 4 (four) hours as needed.  30 capsule  0  . pantoprazole (PROTONIX) 40 MG tablet Take 40 mg by mouth daily.      . valACYclovir (VALTREX) 500 MG tablet One tablet twice daily for 3 days. Start at first sign of breakout.  12 tablet  5  . acetaminophen (TYLENOL) 500 MG tablet Take  1,000 mg by mouth every 6 (six) hours as needed for pain.      Marland Kitchen HYDROmorphone (DILAUDID) 2 MG tablet Take 1 tablet (2 mg total) by mouth every 4 (four) hours as needed for pain.  30 tablet  0  . ondansetron (ZOFRAN ODT) 4 MG disintegrating tablet Take 1 tablet (4 mg total) by mouth every 8 (eight) hours as needed for nausea.  20 tablet  0  . oxyCODONE-acetaminophen (PERCOCET/ROXICET) 5-325 MG per tablet Take 2 tablets by mouth every 4 (four) hours as needed for pain.  20 tablet  0   No facility-administered medications prior to visit.   No Known Allergies Patient Active Problem List   Diagnosis Date Noted  . Rectal bleeding 10/09/2012  . Penile rash 08/26/2012  . Hematuria 06/11/2012   . Pancreatitis, acute 06/11/2012  . Vision problem 02/02/2012  . Insomnia 12/20/2011  . Depression with anxiety 11/13/2010  . HYPERLIPIDEMIA 06/27/2010  . DIABETES MELLITUS, TYPE II 05/22/2010  . HYPERTENSION 05/22/2010   History  Substance Use Topics  . Smoking status: Never Smoker   . Smokeless tobacco: Never Used  . Alcohol Use: 9.0 oz/week    15 Cans of beer per week     Comment: 08/04/2012 "maybe 15 beers/wk; I don't drink qd"   family history includes Diabetes in his mother; Lung cancer in his brother; and Pancreatic cancer in his paternal aunt.  There is no history of Colon cancer.     Objective:   Physical Exam well-developed white male in no acute distress. Blood pressure 118/88 pulse 96 height 5 foot 11 weight 258. HEENT; nontraumatic normocephalic EOMI PERRLA sclera anicteric, Neck; supple no JVD, Cardiovascular regular rate and rhythm with S1-S2 no murmur or gallop, Pulmonary; clear bilaterally, Abdomen; soft and is mildly tender in the epigastrium and hypogastrium there is no guarding or rebound no palpable mass or hepatosplenomegaly, Rectal; exam no external lesions noted, anoscopy was done patient had a very difficult time relaxing but no internal hemorrhoids noted, no stool in vault to hemocult ., Extremities ;no clubbing cyanosis or edema skin warm and dry, Psych; mood and affect normal and appropriate.        Assessment & Plan:  #1 37 yo  old male with recurrent rectal bleeding x1 month with blood noted mixed in with the bowel movements. Cannot appreciate any significant internal hemorrhoids on anoscopy and therefore am concerned he may have a proctitis or mild colitis accounting for his recurrent bleeding. #2 recurrent alcohol-related pancreatitis with most recent episode 2 weeks ago-he is stable and recovering #3 diverticulosis #4 adult-onset diabetes mellitus #5 hypertension #6 hyperlipidemia  Plan ; patient had a normal CBC on 10/09/2012 Will schedule for  flexible sigmoidoscopy with Dr. Christella Hartigan for further evaluation-procedure was discussed in detail with patient and he is agreeable to proceed. Further plans pending findings at sigmoidoscopy

## 2012-10-22 ENCOUNTER — Telehealth: Payer: Self-pay | Admitting: Gastroenterology

## 2012-10-24 NOTE — Telephone Encounter (Signed)
no

## 2012-10-26 ENCOUNTER — Other Ambulatory Visit: Payer: 59 | Admitting: Gastroenterology

## 2012-11-05 ENCOUNTER — Other Ambulatory Visit: Payer: Self-pay

## 2012-11-15 ENCOUNTER — Other Ambulatory Visit: Payer: Self-pay | Admitting: Family

## 2012-11-16 ENCOUNTER — Ambulatory Visit (INDEPENDENT_AMBULATORY_CARE_PROVIDER_SITE_OTHER): Payer: 59 | Admitting: Family

## 2012-11-16 ENCOUNTER — Encounter: Payer: Self-pay | Admitting: Family

## 2012-11-16 ENCOUNTER — Ambulatory Visit (HOSPITAL_BASED_OUTPATIENT_CLINIC_OR_DEPARTMENT_OTHER)
Admission: RE | Admit: 2012-11-16 | Discharge: 2012-11-16 | Disposition: A | Discharge status: Home / Self Care | Payer: 59 | Source: Ambulatory Visit | Attending: Family | Admitting: Family

## 2012-11-16 VITALS — BP 110/80 | HR 86 | Temp 98.1°F | Resp 16 | Wt 251.0 lb

## 2012-11-16 DIAGNOSIS — R05 Cough: Secondary | ICD-10-CM

## 2012-11-16 DIAGNOSIS — R062 Wheezing: Secondary | ICD-10-CM | POA: Insufficient documentation

## 2012-11-16 DIAGNOSIS — R059 Cough, unspecified: Secondary | ICD-10-CM | POA: Insufficient documentation

## 2012-11-16 DIAGNOSIS — J209 Acute bronchitis, unspecified: Secondary | ICD-10-CM

## 2012-11-16 MED ORDER — ALPRAZOLAM 0.5 MG PO TABS: ORAL_TABLET | ORAL | Status: DC

## 2012-11-16 MED ORDER — AZITHROMYCIN 250 MG PO TABS: ORAL_TABLET | ORAL | Status: DC

## 2012-11-16 MED ORDER — BENZONATATE 100 MG PO CAPS: 100.0000 mg | ORAL_CAPSULE | Freq: Three times a day (TID) | ORAL | Status: DC | PRN

## 2012-11-16 NOTE — Progress Notes (Signed)
Subjective:    Patient ID: Willie [name], male    DOB: 1975/05/07, 37 y.o.   MRN: [id number]  HPI  [name] is a 37 yr old male who presents today with chief complaint of cough.  Reports that symptoms started one week ago.  Has been taking nyquil which was helping.   Cough is productive at times of thick thick phlegm. Denies fever or sob.  Does report feeling weak and dizzy.   Review of Systems See HPI  Past Medical History  Diagnosis Date  . Alcoholic pancreatitis     recurrent  . Diabetes mellitus, type II     New Onset 03/2010  . Hypertension   . 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 High cholesterol   . Sleep apnea   . GERD (gastroesophageal reflux disease)   . Depression     History   Social History  . Marital Status: Married    Spouse Name: N/A    Number of Children: 2  . 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 Smoking status: Never Smoker   . Smokeless tobacco: Never Used  . Alcohol Use: 9.0 oz/week    15 Cans of beer per week     Comment: 08/04/2012 "maybe 15 beers/wk; I don't drink qd"  . Drug Use: No  . Sexually Active: Yes   Other Topics Concern  . 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 Lumbar microdiscectomy  ~ 2004    Dr Danielle Dess    Family History  Problem Relation Age of Onset  . Diabetes Mother   . Lung cancer Brother     twin brother  . Colon cancer Neg Hx   . Pancreatic cancer Paternal Aunt     No Known Allergies  Current Outpatient Prescriptions on File Prior to Visit  Medication Sig Dispense Refill  . ALPRAZolam (XANAX) 0.5 MG tablet One tablet by  mouth twice daily as needed.  60 tablet  0  . atorvastatin (LIPITOR) 10 MG tablet Take 1 tablet (10 mg total) by mouth daily.  90 tablet  0  . bisoprolol (ZEBETA) 10 MG tablet Take 1 tablet (10 mg total) by mouth daily.  90 tablet  1  . glucose blood (ONE TOUCH TEST STRIPS) test strip 1 each. Use to check blood sugar once day      . metFORMIN (GLUCOPHAGE) 500 MG tablet TAKE 2 TABLETS (1,000 MG TOTAL) BY MOUTH 2 (TWO) TIMES DAILY WITH A MEAL.  120 tablet  3  . multivitamin (ONE-A-DAY MEN'S) TABS Take 1 tablet by mouth daily.        Marland Kitchen olmesartan-hydrochlorothiazide (BENICAR HCT) 40-25 MG per tablet Take 1 tablet by mouth daily.  90 tablet  1  . ONE TOUCH LANCETS MISC by Does not apply route. Use to check blood sugar once a day       . oxycodone (OXY-IR) 5 MG capsule Take 1 capsule (5 mg total) by mouth every 4 (four) hours  as needed.  30 capsule  0  . pantoprazole (PROTONIX) 40 MG tablet Take 40 mg by mouth daily.      . valACYclovir (VALTREX) 500 MG tablet One tablet twice daily for 3 days. Start at first sign of breakout.  12 tablet  5   No current facility-administered medications on file prior to visit.    BP 110/80  Pulse 86  Temp(Src) 98.1 F (36.7 C) (Oral)  Resp 16  Wt 251 lb (113.853 kg)  BMI 35.02 kg/m2  SpO2 98%       Objective:   Physical Exam  Constitutional: He is oriented to person, place, and time. He appears well-developed and well-nourished. No distress.  HENT:  Head: Normocephalic and atraumatic.  Right Ear: Tympanic membrane and ear canal normal.  Left Ear: Tympanic membrane and ear canal normal.  Mouth/Throat: No oropharyngeal exudate, posterior oropharyngeal edema or posterior oropharyngeal erythema.  Cardiovascular: Normal rate and regular rhythm.   No murmur heard. Pulmonary/Chest: Effort normal and breath sounds normal. No respiratory distress. He has no wheezes. He has no rales. He exhibits no tenderness.  Musculoskeletal: He exhibits no edema.   Neurological: He is alert and oriented to person, place, and time.  Psychiatric: He has a normal mood and affect. His behavior is normal. Judgment and thought content normal.          Assessment & Plan:

## 2012-11-16 NOTE — Telephone Encounter (Signed)
Rx request phoned to pharmacy/SLS  

## 2012-11-16 NOTE — Telephone Encounter (Signed)
eScribe request for refill on Alprazolam Last filled - 06.09.14, #60x0 Last AEX - 06.20.14 Please advise on refills/SLS

## 2012-11-16 NOTE — Telephone Encounter (Signed)
OK to sen #[id number] zero refills.

## 2012-11-16 NOTE — Patient Instructions (Addendum)
Please complete your chest x ray on the first floor. Call if symptoms worsen, or if not improved in 2-3 days.

## 2012-11-19 DIAGNOSIS — J209 Acute bronchitis, unspecified: Secondary | ICD-10-CM | POA: Insufficient documentation

## 2012-11-19 NOTE — Assessment & Plan Note (Signed)
CXR is negative for acute changes. Will rx with zithromax and tessalon.

## 2012-11-24 ENCOUNTER — Ambulatory Visit: Payer: 59 | Admitting: Family

## 2012-11-24 ENCOUNTER — Telehealth: Payer: Self-pay | Admitting: Gastroenterology

## 2012-11-24 ENCOUNTER — Other Ambulatory Visit: Payer: 59 | Admitting: Gastroenterology

## 2012-11-24 NOTE — Telephone Encounter (Signed)
Left message on machine to call back  

## 2012-11-24 NOTE — Telephone Encounter (Signed)
He cancelled this procedure last minute one month ago. No showed today.    Patty,  Can you please call him.  He needs rov with me in the office to discuss his symptoms again before the procedure can be rescheduled.

## 2012-11-25 ENCOUNTER — Encounter: Payer: Self-pay | Admitting: Family Medicine

## 2012-11-25 NOTE — Telephone Encounter (Signed)
Left message on machine to call back letter mailed. 

## 2012-11-30 ENCOUNTER — Other Ambulatory Visit: Payer: Self-pay | Admitting: Family

## 2012-11-30 NOTE — Telephone Encounter (Signed)
Rx request to pharmacy/SLS  

## 2012-12-04 ENCOUNTER — Telehealth: Payer: Self-pay | Admitting: Family

## 2012-12-04 ENCOUNTER — Other Ambulatory Visit: Payer: Self-pay | Admitting: Family

## 2012-12-04 NOTE — Telephone Encounter (Signed)
Patient states that he was fired from his job yesterday and they will not let him back on the premises to get his prescription of xanax. He would like to know if Efraim Kaufmann would fill another prescription for him? Also, patient states that he feels like he needs a stronger dosage of xanax.

## 2012-12-04 NOTE — Telephone Encounter (Signed)
We cannot fill early refills for xanax even if lost or stolen.  If he thinks anxiety has worsened, he can schedule office visit to discuss.

## 2012-12-04 NOTE — Telephone Encounter (Signed)
See phone note, 12/04/12. Xanax refill denied.

## 2012-12-04 NOTE — Telephone Encounter (Signed)
Advised pt of below instructions and he is agreeable to an appt. Appt scheduled for 12/09/12 at 8:15am. Advised pt this will not ensure rx for xanax and is a re-evaluation of current anxiety. Pt voices understanding.

## 2012-12-09 ENCOUNTER — Encounter: Payer: Self-pay | Admitting: Family

## 2012-12-09 ENCOUNTER — Ambulatory Visit (INDEPENDENT_AMBULATORY_CARE_PROVIDER_SITE_OTHER): Payer: 59 | Admitting: Family

## 2012-12-09 VITALS — BP 120/84 | HR 88 | Temp 97.9°F | Resp 16 | Ht 70.5 in | Wt 246.0 lb

## 2012-12-09 DIAGNOSIS — F418 Other specified anxiety disorders: Secondary | ICD-10-CM

## 2012-12-09 DIAGNOSIS — F341 Dysthymic disorder: Secondary | ICD-10-CM

## 2012-12-09 DIAGNOSIS — N529 Male erectile dysfunction, unspecified: Secondary | ICD-10-CM

## 2012-12-09 MED ORDER — ALPRAZOLAM 0.5 MG PO TABS: ORAL_TABLET | ORAL | Status: DC

## 2012-12-09 MED ORDER — SILDENAFIL CITRATE 50 MG PO TABS: 50.0000 mg | ORAL_TABLET | Freq: Every day | ORAL | Status: DC | PRN

## 2012-12-09 NOTE — Assessment & Plan Note (Signed)
Trial of viagra 

## 2012-12-09 NOTE — Assessment & Plan Note (Addendum)
Deteriorated. Will increase xanax to tid. We discussed that in the future, no early refills will be provided. Continue cymbalta.  Recent deterioration in his depression appears situational.

## 2012-12-09 NOTE — Progress Notes (Signed)
Subjective:    Patient ID: Willie [name], male    DOB: [birth year], 37 y.o.   MRN: [id number]  HPI  Mr. Madlock is a 37 yr old male who presents today to discuss depression/anxiety.  He reports that he was recently terminated from his job.  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  Since that time he reports crying fits several times a day. Reports that he is able to get out fo bed but reports poor motivation.  Having trouble attending therapy sessions due to finances.  He sees [facility] for counseling.  He reports that he was using xanax tid.  He reports poor sleep- only sleeping 3 hours.  Looking for work.  He has a new puppy.  He reports occasional panic attacks.  Starts "heaving" during crying attacks.  He is currently separated from his wife and this is stressful for him.  Reports that for the last few days he has been drinking due to anxiety and not having any xanax. He has been drinking beer (3-4) he has switched to vodka.  Drank 3-4 ounces of vodka last night.  Denies suicide ideation/homocide ideation.    ED- reports that he has recently had trouble maintaining an erection during sex.  He is bothered by this.  Review of Systems See HPI  Past Medical History  Diagnosis Date  . Alcoholic pancreatitis     recurrent  . Diabetes mellitus, type II     New Onset 03/2010  . Hypertension   . 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 High cholesterol   . Sleep apnea   . GERD (gastroesophageal reflux disease)   . Depression     History   Social History  . Marital Status: Married    Spouse Name: N/A    Number of Children: 2  . 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 Smoking status: Never Smoker   . Smokeless tobacco: Never Used  . Alcohol Use: 9.0 oz/week    15 Cans of beer per week     Comment:  08/04/2012 "maybe 15 beers/wk; I don't drink qd"  . Drug Use: No  . Sexual Activity: Yes   Other Topics Concern  . 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 Lumbar microdiscectomy  ~ 2004    Dr Danielle Dess    Family History  Problem Relation Age of Onset  . Diabetes Mother   . Lung cancer Brother     twin brother  . Colon cancer Neg Hx   . Pancreatic cancer Paternal Aunt     No Known Allergies  Current Outpatient Prescriptions on File Prior to Visit  Medication Sig Dispense Refill  . atorvastatin (LIPITOR) 10 MG tablet Take 1 tablet (10 mg total) by mouth daily.  90 tablet  0  . bisoprolol (ZEBETA) 10 MG tablet Take 1 tablet (10 mg total) by mouth daily.  90 tablet  1  . glucose blood (ONE  TOUCH TEST STRIPS) test strip 1 each. Use to check blood sugar once day      . metFORMIN (GLUCOPHAGE) 500 MG tablet TAKE 2 TABLETS (1,000 MG TOTAL) BY MOUTH 2 (TWO) TIMES DAILY WITH A MEAL.  120 tablet  3  . multivitamin (ONE-A-DAY MEN'S) TABS Take 1 tablet by mouth daily.        Marland Kitchen olmesartan-hydrochlorothiazide (BENICAR HCT) 40-25 MG per tablet Take 1 tablet by mouth daily.  90 tablet  1  . ONE TOUCH LANCETS MISC by Does not apply route. Use to check blood sugar once a day       . pantoprazole (PROTONIX) 40 MG tablet TAKE 1 TABLET (40 MG TOTAL) BY MOUTH DAILY.  30 tablet  3  . valACYclovir (VALTREX) 500 MG tablet One tablet twice daily for 3 days. Start at first sign of breakout.  12 tablet  5   No current facility-administered medications on file prior to visit.    BP 120/84  Pulse 88  Temp(Src) 97.9 F (36.6 C) (Oral)  Resp 16  Ht 5' 10.5" (1.791 m)  Wt 246 lb (111.585 kg)  BMI 34.79 kg/m2  SpO2 98%       Objective:   Physical Exam   Constitutional: He is oriented to person, place, and time. He appears well-developed and well-nourished. No distress.  HENT:  Head: Normocephalic and atraumatic.  Cardiovascular: Normal rate and regular rhythm.   No murmur heard. Pulmonary/Chest: Effort normal and breath sounds normal. No respiratory distress. He has no wheezes. He has no rales. He exhibits no tenderness.  Abdominal: Soft. Bowel sounds are normal. He exhibits no distension. There is no tenderness.  Musculoskeletal: He exhibits no edema.  Neurological: He is alert and oriented to person, place, and time.  Psychiatric: He has a normal mood and affect. His behavior is normal. Judgment and thought content normal.          Assessment & Plan:

## 2012-12-09 NOTE — Patient Instructions (Addendum)
Please follow up in 3 months.  Call sooner if problems/concerns.  

## 2012-12-20 ENCOUNTER — Other Ambulatory Visit: Payer: Self-pay | Admitting: Family

## 2012-12-21 MED ORDER — ATORVASTATIN CALCIUM 10 MG PO TABS: 10.0000 mg | ORAL_TABLET | Freq: Every day | ORAL | Status: DC

## 2012-12-21 NOTE — Telephone Encounter (Signed)
Rx request to pharmacy/SLS  

## 2012-12-29 ENCOUNTER — Encounter (HOSPITAL_BASED_OUTPATIENT_CLINIC_OR_DEPARTMENT_OTHER): Payer: Self-pay | Admitting: *Deleted

## 2012-12-29 ENCOUNTER — Emergency Department (HOSPITAL_BASED_OUTPATIENT_CLINIC_OR_DEPARTMENT_OTHER)
Admission: EM | Admit: 2012-12-29 | Discharge: 2012-12-30 | Disposition: A | Discharge status: Home / Self Care | Payer: 59 | Attending: Emergency Medicine | Admitting: Emergency Medicine

## 2012-12-29 DIAGNOSIS — I10 Essential (primary) hypertension: Secondary | ICD-10-CM | POA: Insufficient documentation

## 2012-12-29 DIAGNOSIS — E876 Hypokalemia: Secondary | ICD-10-CM | POA: Insufficient documentation

## 2012-12-29 DIAGNOSIS — R000 Tachycardia, unspecified: Secondary | ICD-10-CM | POA: Insufficient documentation

## 2012-12-29 DIAGNOSIS — F329 Major depressive disorder, single episode, unspecified: Secondary | ICD-10-CM | POA: Insufficient documentation

## 2012-12-29 DIAGNOSIS — E119 Type 2 diabetes mellitus without complications: Secondary | ICD-10-CM | POA: Insufficient documentation

## 2012-12-29 DIAGNOSIS — K859 Acute pancreatitis without necrosis or infection, unspecified: Secondary | ICD-10-CM | POA: Insufficient documentation

## 2012-12-29 DIAGNOSIS — Z8739 Personal history of other diseases of the musculoskeletal system and connective tissue: Secondary | ICD-10-CM | POA: Insufficient documentation

## 2012-12-29 DIAGNOSIS — F3289 Other specified depressive episodes: Secondary | ICD-10-CM | POA: Insufficient documentation

## 2012-12-29 DIAGNOSIS — Z79899 Other long term (current) drug therapy: Secondary | ICD-10-CM | POA: Insufficient documentation

## 2012-12-29 DIAGNOSIS — K219 Gastro-esophageal reflux disease without esophagitis: Secondary | ICD-10-CM | POA: Insufficient documentation

## 2012-12-29 DIAGNOSIS — E7800 Pure hypercholesterolemia, unspecified: Secondary | ICD-10-CM | POA: Insufficient documentation

## 2012-12-29 DIAGNOSIS — G4730 Sleep apnea, unspecified: Secondary | ICD-10-CM | POA: Insufficient documentation

## 2012-12-29 LAB — COMPREHENSIVE METABOLIC PANEL
ALT: 82 U/L — ABNORMAL HIGH (ref 0–53)
BUN: 6 mg/dL (ref 6–23)
CO2: 25 mEq/L (ref 19–32)
Calcium: 9.7 mg/dL (ref 8.4–10.5)
Creatinine, Ser: 0.8 mg/dL (ref 0.50–1.35)
GFR calc Af Amer: >90 mL/min (ref >90)
GFR calc non Af Amer: >90 mL/min (ref >90)
Glucose, Bld: 388 mg/dL — ABNORMAL HIGH (ref 70–99)

## 2012-12-29 LAB — CBC WITH DIFFERENTIAL/PLATELET
Eosinophils Relative: 2 % (ref 0–5)
HCT: 39.9 % (ref 39.0–52.0)
Lymphocytes Relative: 33 % (ref 12–46)
Lymphs Abs: 2.3 10*3/uL (ref 0.7–4.0)
MCV: 84.2 fL (ref 78.0–100.0)
Monocytes Absolute: 0.5 10*3/uL (ref 0.1–1.0)
RBC: 4.74 MIL/uL (ref 4.22–5.81)
WBC: 6.9 10*3/uL (ref 4.0–10.5)

## 2012-12-29 LAB — ETHANOL: Alcohol, Ethyl (B): 83 mg/dL — ABNORMAL HIGH (ref 0–11)

## 2012-12-29 MED ORDER — SODIUM CHLORIDE 0.9 % IV BOLUS (SEPSIS)
1000.0000 mL | Freq: Once | INTRAVENOUS | Status: AC
  Administered 2012-12-29: 1000 mL via INTRAVENOUS

## 2012-12-29 MED ORDER — HYDROMORPHONE HCL PF 1 MG/ML IJ SOLN
1.0000 mg | Freq: Once | INTRAMUSCULAR | Status: AC
  Administered 2012-12-29: 1 mg via INTRAVENOUS
  Filled 2012-12-29: qty 1

## 2012-12-29 MED ORDER — SODIUM CHLORIDE 0.9 % IV BOLUS (SEPSIS)
1000.0000 mL | Freq: Once | INTRAVENOUS | Status: AC
  Administered 2012-12-30: 1000 mL via INTRAVENOUS

## 2012-12-29 MED ORDER — ONDANSETRON HCL 4 MG/2ML IJ SOLN
4.0000 mg | Freq: Once | INTRAMUSCULAR | Status: AC
  Administered 2012-12-29: 4 mg via INTRAVENOUS
  Filled 2012-12-29: qty 2

## 2012-12-29 MED ORDER — PANTOPRAZOLE SODIUM 40 MG IV SOLR
40.0000 mg | Freq: Once | INTRAVENOUS | Status: AC
  Administered 2012-12-29: 40 mg via INTRAVENOUS
  Filled 2012-12-29: qty 40

## 2012-12-29 NOTE — ED Provider Notes (Signed)
CSN: [id number]     Arrival date & time 12/29/12  2214 History   First MD Initiated Contact with Patient 12/29/12 2301     Chief Complaint  Patient presents with  . Abdominal Pain   (Consider location/radiation/quality/duration/timing/severity/associated sxs/prior Treatment) HPI Comments: 37 year old male presents with recurrent abdominal pain since yesterday. He states his epigastric pain that radiates to his back. This feels like his prior episodes of pancreatitis. He states that his pancreatitis because of alcohol use. Denies any nausea or vomiting but has been burping more. He had alcohol over the past weekend up until yesterday. The pain is currently an 8/10. The pain is worsened by food. He's tried to avoid to do this but has been unable to and has thus had worsening pain.  The history is provided by the patient.    Past Medical History  Diagnosis Date  . Alcoholic pancreatitis     recurrent  . Diabetes mellitus, type II     New Onset 03/2010  . Hypertension   . 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 High cholesterol   . Sleep apnea   . GERD (gastroesophageal reflux disease)   . Depression    Past Surgical History  Procedure Laterality Date  . Lumbar microdiscectomy  ~ 2004    Dr Danielle Dess   Family History  Problem Relation Age of Onset  . Diabetes Mother   . Lung cancer Brother     twin brother  . Colon cancer Neg Hx   . Pancreatic cancer Paternal Aunt    History  Substance Use Topics  . Smoking status: Never Smoker   . Smokeless tobacco: Never Used  . Alcohol Use: 9.0 oz/week    15 Cans of beer per week     Comment: 08/04/2012 "maybe 15 beers/wk; I don't drink qd"    Review of Systems  Constitutional: Negative for fever and chills.  Respiratory: Negative for cough and shortness of breath.   Cardiovascular: Negative for chest pain.  Gastrointestinal: Positive for abdominal pain and diarrhea. Negative for nausea  and vomiting.       Frequent burping  Genitourinary: Negative for dysuria.  Musculoskeletal: Positive for back pain.  All other systems reviewed and are negative.    Allergies  Review of patient's allergies indicates no known allergies.  Home Medications   Current Outpatient Rx  Name  Route  Sig  Dispense  Refill  . ALPRAZolam (XANAX) 0.5 MG tablet      One tablet by mouth twice daily as needed.   90 tablet   0   . atorvastatin (LIPITOR) 10 MG tablet   Oral   Take 1 tablet (10 mg total) by mouth daily.   90 tablet   0   . bisoprolol (ZEBETA) 10 MG tablet   Oral   Take 1 tablet (10 mg total) by mouth daily.   90 tablet   1   . DULoxetine (CYMBALTA) 60 MG capsule   Oral   Take 60 mg by mouth daily.         . metFORMIN (GLUCOPHAGE) 500 MG tablet      TAKE 2 TABLETS (1,000 MG TOTAL) BY MOUTH 2 (TWO) TIMES DAILY WITH A MEAL.   120 tablet   3   . olmesartan-hydrochlorothiazide (BENICAR HCT) 40-25 MG per tablet   Oral   Take 1 tablet by mouth daily.   90 tablet   1   .  pantoprazole (PROTONIX) 40 MG tablet      TAKE 1 TABLET (40 MG TOTAL) BY MOUTH DAILY.   30 tablet   3   . sildenafil (VIAGRA) 50 MG tablet   Oral   Take 1 tablet (50 mg total) by mouth daily as needed for erectile dysfunction.   10 tablet   2   . valACYclovir (VALTREX) 500 MG tablet      One tablet twice daily for 3 days. Start at first sign of breakout.   12 tablet   5   . glucose blood (ONE TOUCH TEST STRIPS) test strip      1 each. Use to check blood sugar once day         . multivitamin (ONE-A-DAY MEN'S) TABS   Oral   Take 1 tablet by mouth daily.           . ONE TOUCH LANCETS MISC   Does not apply   by Does not apply route. Use to check blood sugar once a day           BP 156/104  Pulse 105  Temp(Src) 98.7 F (37.1 C) (Oral)  Resp 20  SpO2 99% Physical Exam  Nursing note and vitals reviewed. Constitutional: He is oriented to person, place, and time. He  appears well-developed and well-nourished.  Frequent burping  HENT:  Head: Normocephalic and atraumatic.  Right Ear: External ear normal.  Left Ear: External ear normal.  Nose: Nose normal.  Eyes: Right eye exhibits no discharge. Left eye exhibits no discharge.  Neck: Neck supple.  Cardiovascular: Regular rhythm, normal heart sounds and intact distal pulses.  Tachycardia present.   Pulmonary/Chest: Effort normal and breath sounds normal. He exhibits no tenderness.  Abdominal: Soft. There is tenderness in the epigastric area. There is no rebound and no guarding.  Musculoskeletal: He exhibits no edema.  Neurological: He is alert and oriented to person, place, and time.  Skin: Skin is warm and dry.    ED Course  Procedures (including critical care time) Labs Review Labs Reviewed  COMPREHENSIVE METABOLIC PANEL - Abnormal; Notable for the following:    Potassium 3.3 (*)    Glucose, Bld 388 (*)    AST 54 (*)    ALT 82 (*)    All other components within normal limits  LIPASE, BLOOD - Abnormal; Notable for the following:    Lipase 432 (*)    All other components within normal limits  ETHANOL - Abnormal; Notable for the following:    Alcohol, Ethyl (B) 83 (*)    All other components within normal limits  CBC WITH DIFFERENTIAL   Imaging Review No results found.  MDM   1. Pancreatitis   2. Hypokalemia    Patient is well-appearing, however has recurrent pancreatitis. Given IV narcotics for pain. Given fluids and antibiotics. Most likely etiology is his recurrent alcohol use. Pain was controlled with pain medicine. His potassium was repleted orally. I discussed with the patient admission, however patient declines. He states that his wife is a Engineer, civil (consulting) and he would rather be home. I discussed that pancreatitis is a serious disease that can be life-threatening. Patient understands risks of going home. He states he is going to follow up with his PCP in 1 day.    Audree Camel,  MD 12/30/12 [phone]

## 2012-12-29 NOTE — ED Notes (Signed)
C/o abd pain with diarrhea. Pt has hx of pancreatitis and admits to ETOH.

## 2012-12-30 ENCOUNTER — Telehealth: Payer: Self-pay | Admitting: Family

## 2012-12-30 MED ORDER — HYDROMORPHONE HCL PF 1 MG/ML IJ SOLN
1.0000 mg | Freq: Once | INTRAMUSCULAR | Status: AC
  Administered 2012-12-30: 1 mg via INTRAVENOUS
  Filled 2012-12-30: qty 1

## 2012-12-30 MED ORDER — POTASSIUM CHLORIDE CRYS ER 20 MEQ PO TBCR
40.0000 meq | EXTENDED_RELEASE_TABLET | Freq: Once | ORAL | Status: AC
Start: 2012-12-30 — End: 2012-12-30
  Administered 2012-12-30: 40 meq via ORAL
  Filled 2012-12-30: qty 2

## 2012-12-30 MED ORDER — HYDROCODONE-ACETAMINOPHEN 5-325 MG PO TABS: 1.0000 | ORAL_TABLET | ORAL | Status: DC | PRN

## 2012-12-30 NOTE — ED Notes (Signed)
Pt observed getting into passenger side of vehicle and riding away from ER. Pt advised not to drive and that he may return to get his vehicle later today. Pt verbalized understanding.

## 2012-12-30 NOTE — Telephone Encounter (Signed)
Please call pt and arrange an ED follow up for this afternoon.

## 2012-12-30 NOTE — Telephone Encounter (Signed)
Received message from pt requesting pain medication for his pancreatitis. Per verbal from Provider, ok to call in hydrocodone 5/325, #10. Take 1 tablet every 4 hours as needed for pain. Spoke with pt and scheduled follow up for tomorrow at 10:30 with Malva Cogan, PA.

## 2012-12-31 ENCOUNTER — Encounter: Payer: Self-pay | Admitting: Physician Assistant

## 2012-12-31 ENCOUNTER — Ambulatory Visit (INDEPENDENT_AMBULATORY_CARE_PROVIDER_SITE_OTHER): Payer: 59 | Admitting: Physician Assistant

## 2012-12-31 VITALS — BP 142/100 | HR 64 | Temp 97.8°F | Resp 16 | Wt 248.2 lb

## 2012-12-31 DIAGNOSIS — K852 Alcohol induced acute pancreatitis without necrosis or infection: Secondary | ICD-10-CM

## 2012-12-31 DIAGNOSIS — F1010 Alcohol abuse, uncomplicated: Secondary | ICD-10-CM

## 2012-12-31 DIAGNOSIS — F341 Dysthymic disorder: Secondary | ICD-10-CM

## 2012-12-31 DIAGNOSIS — I10 Essential (primary) hypertension: Secondary | ICD-10-CM

## 2012-12-31 DIAGNOSIS — F418 Other specified anxiety disorders: Secondary | ICD-10-CM

## 2012-12-31 DIAGNOSIS — K859 Acute pancreatitis without necrosis or infection, unspecified: Secondary | ICD-10-CM

## 2012-12-31 LAB — CBC WITH DIFFERENTIAL/PLATELET
Basophils Absolute: 0 10*3/uL (ref 0.0–0.1)
Eosinophils Relative: 3 % (ref 0–5)
HCT: 40.6 % (ref 39.0–52.0)
Lymphocytes Relative: 26 % (ref 12–46)
Lymphs Abs: 1.4 10*3/uL (ref 0.7–4.0)
MCV: 85.8 fL (ref 78.0–100.0)
Monocytes Absolute: 0.4 10*3/uL (ref 0.1–1.0)
Monocytes Relative: 8 % (ref 3–12)
RDW: 14.4 % (ref 11.5–15.5)
WBC: 5.4 10*3/uL (ref 4.0–10.5)

## 2012-12-31 LAB — COMPREHENSIVE METABOLIC PANEL
Alkaline Phosphatase: 77 U/L (ref 39–117)
Glucose, Bld: 207 mg/dL — ABNORMAL HIGH (ref 70–99)
Sodium: 135 mEq/L (ref 135–145)
Total Bilirubin: 1 mg/dL (ref 0.3–1.2)
Total Protein: 6.8 g/dL (ref 6.0–8.3)

## 2012-12-31 MED ORDER — HYDROCODONE-ACETAMINOPHEN 5-325 MG PO TABS: 1.0000 | ORAL_TABLET | ORAL | Status: DC | PRN

## 2012-12-31 NOTE — Assessment & Plan Note (Signed)
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  Patient currently in [place]. Has  Been to counseling in the past but cannot afford it currently. 15 minutes spent on alcohol cessation and risks associated with alcohol abuse.  Patient verbalizes understanding of his situation.  Will attempt to find resources for patient to receive counseling or rehab services that are reduced-cost or free to him.  Alcohol cessation will be integral to his continued health, as many of his comorbid conditions are exacerbated by alcohol consumption.

## 2012-12-31 NOTE — Assessment & Plan Note (Signed)
Patient states overall his depression and anxiety are controlled with daily cymbalta and xanax.  However patient reports his main coping mechanism is drinking alcohol, and he does not have money to afford counseling sessions.  Encouraged alcohol cessation and spoke with the patient about finding healthier coping mechanisms for his depression -- such as daily exercise, watching his favorite movies, or spending 15 minutes a day doing something he enjoys doing -- to avoid returning to alcohol.  Will attempt to find cost-effective resources for the patient.

## 2012-12-31 NOTE — Assessment & Plan Note (Signed)
Patient with elevated bp today in clinic.  Patient endorses not taking his BP medications this am prior to visit.  Patient to restart BP medications as prescribed.  Will see him in 1 week for follow-up for pancreatitis.  Will recheck BP at that visit.

## 2012-12-31 NOTE — Progress Notes (Signed)
Patient ID: Willie [name], male   DOB: [birth year], 37 y.o.   MRN: [id number]  Patient presents to clinic today for ED follow-up for acute pancreatitis.  Information was obtained from the patient.  Patient has history of multiple recurrent episodes of alcohol-induced pancreatitis.  Most recent was on 12/28/12 where patient presented to ED complaining of Nausea and intense epigastric pain.  Patient was treated in the ED and was advised he should be admitted, but the patient declined and left the ED.  Since ED visit patient still having epigastric pain that is 7/10 on the pain scale.  Has taken vidocin 5-325 mg tablet every 4 hours since filling prescription yesterday.  Stays on a couple of occasions he took two vicodin with alleviation of his pain.  Had some nausea and 1 episode of non-bloody emesis yesterday, but none since.  States he takes his zofran if he is feeling nauseas.  Has avoided foods and alcohol.  Denies fevers, chills, sweats, change to bladder habits.  Denies pain elsewhere.  As stated before, patient has been seen multiple times for alcoholic pancreatitis.  Patient still drinking alcohol despite illness and counseling efforts from NP [facility] for alcohol cessation.  Patient also with history of depression for which he takes a daily cymbalta and xanax BID.  Patient endorses he has tried counseling in the past which was somewhat helpful, but he recently lost his job and cannot afford sessions.  Patient's wife/ex-wife is a [employer], but patient states he was denied 5 free counseling sessions through her insurance.  Patient states he occasionally goes to [facility], but cannot afford the gas to get there.  States he has been a heavy drinker since young adulthood and has tried to cut back, but with recent stressors he feels he has no other way to cope.  Past Medical History  Diagnosis Date  . Alcoholic pancreatitis     recurrent  . Diabetes mellitus, type II     New Onset 03/2010   . Hypertension   . 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 High cholesterol   . Sleep apnea   . GERD (gastroesophageal reflux disease)   . Depression     Current Outpatient Prescriptions on File Prior to Visit  Medication Sig Dispense Refill  . ALPRAZolam (XANAX) 0.5 MG tablet One tablet by mouth twice daily as needed.  90 tablet  0  . atorvastatin (LIPITOR) 10 MG tablet Take 1 tablet (10 mg total) by mouth daily.  90 tablet  0  . bisoprolol (ZEBETA) 10 MG tablet Take 1 tablet (10 mg total) by mouth daily.  90 tablet  1  . DULoxetine (CYMBALTA) 60 MG capsule Take 60 mg by mouth daily.      Marland Kitchen glucose blood (ONE TOUCH TEST STRIPS) test strip 1 each. Use to check blood sugar once day      . metFORMIN (GLUCOPHAGE) 500 MG tablet TAKE 2 TABLETS (1,000 MG TOTAL) BY MOUTH 2 (TWO) TIMES DAILY WITH A MEAL.  120 tablet  3  . multivitamin (ONE-A-DAY MEN'S) TABS Take 1 tablet by mouth daily.        Marland Kitchen olmesartan-hydrochlorothiazide (BENICAR HCT) 40-25 MG per tablet Take 1 tablet by mouth daily.  90 tablet  1  . ONE TOUCH LANCETS MISC by Does not apply route. Use to check blood sugar once a day       . pantoprazole (PROTONIX)  40 MG tablet TAKE 1 TABLET (40 MG TOTAL) BY MOUTH DAILY.  30 tablet  3  . sildenafil (VIAGRA) 50 MG tablet Take 1 tablet (50 mg total) by mouth daily as needed for erectile dysfunction.  10 tablet  2  . valACYclovir (VALTREX) 500 MG tablet One tablet twice daily for 3 days. Start at first sign of breakout.  12 tablet  5   No current facility-administered medications on file prior to visit.    No Known Allergies  Family History  Problem Relation Age of Onset  . Diabetes Mother   . Lung cancer Brother     twin brother  . Colon cancer Neg Hx   . Pancreatic cancer Paternal Aunt     History   Social History  . Marital Status: Married    Spouse Name: N/A    Number of Children: 2  . Years of Education: N/A    Occupational History  . 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 Smoking status: Never Smoker   . Smokeless tobacco: Never Used  . Alcohol Use: 12.0 oz/week    15 Cans of beer, 5 Shots of liquor per week     Comment: 08/04/2012 "maybe 15 beers/wk; I don't drink qd"   12/31/2012 "switched to mostly liquor, but trying to cut back"  . Drug Use: No  . Sexual Activity: Yes   Other Topics Concern  . 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  Cardiovascular: Negative for chest pain and palpitations.  Gastrointestinal: Positive for nausea, vomiting and abdominal pain. Negative for diarrhea, constipation, blood in stool and melena.  Musculoskeletal: Negative for myalgias and back pain.  Neurological: Negative for loss of consciousness and headaches.  Endo/Heme/Allergies: Does not bruise/bleed easily.  Psychiatric/Behavioral: Positive for depression and substance abuse. The patient is nervous/anxious.    Filed Vitals:   12/31/12 1038  BP: 142/100  Pulse: 64  Temp: 97.8 F (36.6 C)  Resp: 16   Physical Exam  Vitals reviewed. Constitutional: He is oriented to person, place, and time and well-developed, well-nourished, and in no distress.  HENT:  Head: Normocephalic and atraumatic.  Eyes: Conjunctivae and EOM are normal. Pupils are equal, round, and reactive to light. No scleral icterus.  Neck: Normal range of motion. Neck supple.  Cardiovascular: Normal rate, regular rhythm, normal heart sounds and intact distal pulses.   Pulmonary/Chest: Effort normal and breath sounds normal.  Abdominal: Soft. Bowel sounds are normal. He exhibits no distension and no mass. There is no rebound  and no guarding.  Positive epigastric tenderness to palpation  Lymphadenopathy:    He has no cervical adenopathy.  Neurological: He is alert and oriented to person, place, and time. He has normal reflexes.  Skin: Skin is warm and dry. No rash noted.  Psychiatric: Mood and affect normal.   Recent Results (from the past 2160 hour(s))  URINALYSIS, ROUTINE W REFLEX MICROSCOPIC     Status: Abnormal   Collection Time    10/02/12  4:20 PM      Result Value Range   Color, Urine AMBER (*) YELLOW  Comment: BIOCHEMICALS MAY BE AFFECTED BY COLOR   APPearance CLEAR  CLEAR   Specific Gravity, Urine 1.045 (*) 1.005 - 1.030   pH 5.5  5.0 - 8.0   Glucose, UA >1000 (*) NEGATIVE mg/dL   Hgb urine dipstick NEGATIVE  NEGATIVE   Bilirubin Urine SMALL (*) NEGATIVE   Ketones, ur 15 (*) NEGATIVE mg/dL   Protein, ur 161 (*) NEGATIVE mg/dL   Urobilinogen, UA 0.2  0.0 - 1.0 mg/dL   Nitrite NEGATIVE  NEGATIVE   Leukocytes, UA NEGATIVE  NEGATIVE  URINE MICROSCOPIC-ADD ON     Status: Abnormal   Collection Time    10/02/12  4:20 PM      Result Value Range   Squamous Epithelial / LPF RARE  RARE   WBC, UA 0-2  <3 WBC/hpf   RBC / HPF 0-2  <3 RBC/hpf   Bacteria, UA FEW (*) RARE   Casts GRANULAR CAST (*) NEGATIVE   Comment: HYALINE CASTS   Urine-Other MUCOUS PRESENT    GLUCOSE, CAPILLARY     Status: Abnormal   Collection Time    10/02/12  5:00 PM      Result Value Range   Glucose-Capillary 188 (*) 70 - 99 mg/dL  CBC WITH DIFFERENTIAL     Status: Abnormal   Collection Time    10/02/12  5:40 PM      Result Value Range   WBC 11.6 (*) 4.0 - 10.5 K/uL   RBC 5.49  4.22 - 5.81 MIL/uL   Hemoglobin 16.4  13.0 - 17.0 g/dL   HCT 09.6  04.5 - 40.9 %   MCV 84.7  78.0 - 100.0 fL   MCH 29.9  26.0 - 34.0 pg   MCHC 35.3  30.0 - 36.0 g/dL   RDW 81.1  91.4 - 78.2 %   Platelets 309  150 - 400 K/uL   Neutrophils Relative % 65  43 - 77 %   Neutro Abs 7.6  1.7 - 7.7 K/uL   Lymphocytes Relative 26  12 - 46 %   Lymphs  Abs 3.0  0.7 - 4.0 K/uL   Monocytes Relative 8  3 - 12 %   Monocytes Absolute 0.9  0.1 - 1.0 K/uL   Eosinophils Relative 1  0 - 5 %   Eosinophils Absolute 0.1  0.0 - 0.7 K/uL   Basophils Relative 0  0 - 1 %   Basophils Absolute 0.0  0.0 - 0.1 K/uL  COMPREHENSIVE METABOLIC PANEL     Status: Abnormal   Collection Time    10/02/12  5:40 PM      Result Value Range   Sodium 136  135 - 145 mEq/L   Potassium 4.2  3.5 - 5.1 mEq/L   Chloride 96  96 - 112 mEq/L   CO2 29  19 - 32 mEq/L   Glucose, Bld 210 (*) 70 - 99 mg/dL   BUN 14  6 - 23 mg/dL   Creatinine, Ser 9.56  0.50 - 1.35 mg/dL   Calcium 21.3  8.4 - 08.6 mg/dL   Total Protein 8.3  6.0 - 8.3 g/dL   Albumin 4.6  3.5 - 5.2 g/dL   AST 44 (*) 0 - 37 U/L   ALT 67 (*) 0 - 53 U/L   Alkaline Phosphatase 73  39 - 117 U/L   Total Bilirubin 0.5  0.3 - 1.2 mg/dL   GFR calc non Af Amer 70 (*) >90 mL/min   GFR calc Af Denyse Dago  81 (*) >90 mL/min   Comment:            The eGFR has been calculated     using the CKD EPI equation.     This calculation has not been     validated in all clinical     situations.     eGFR's persistently     <90 mL/min signify     possible Chronic Kidney Disease.  LIPASE, BLOOD     Status: Abnormal   Collection Time    10/02/12  5:40 PM      Result Value Range   Lipase 434 (*) 11 - 59 U/L  GLUCOSE, CAPILLARY     Status: Abnormal   Collection Time    10/03/12 10:13 AM      Result Value Range   Glucose-Capillary 230 (*) 70 - 99 mg/dL   Comment 1 Notify RN     Comment 2 Documented in Chart    CBC WITH DIFFERENTIAL     Status: Abnormal   Collection Time    10/03/12 10:30 AM      Result Value Range   WBC 9.5  4.0 - 10.5 K/uL   RBC 5.18  4.22 - 5.81 MIL/uL   Hemoglobin 15.7  13.0 - 17.0 g/dL   HCT 16.1  09.6 - 04.5 %   MCV 84.4  78.0 - 100.0 fL   MCH 30.3  26.0 - 34.0 pg   MCHC 35.9  30.0 - 36.0 g/dL   RDW 40.9  81.1 - 91.4 %   Platelets 272  150 - 400 K/uL   Neutrophils Relative % 80 (*) 43 - 77 %   Neutro  Abs 7.6  1.7 - 7.7 K/uL   Lymphocytes Relative 12  12 - 46 %   Lymphs Abs 1.1  0.7 - 4.0 K/uL   Monocytes Relative 7  3 - 12 %   Monocytes Absolute 0.7  0.1 - 1.0 K/uL   Eosinophils Relative 1  0 - 5 %   Eosinophils Absolute 0.1  0.0 - 0.7 K/uL   Basophils Relative 0  0 - 1 %   Basophils Absolute 0.0  0.0 - 0.1 K/uL  COMPREHENSIVE METABOLIC PANEL     Status: Abnormal   Collection Time    10/03/12 10:30 AM      Result Value Range   Sodium 132 (*) 135 - 145 mEq/L   Potassium 3.9  3.5 - 5.1 mEq/L   Chloride 92 (*) 96 - 112 mEq/L   CO2 29  19 - 32 mEq/L   Glucose, Bld 238 (*) 70 - 99 mg/dL   BUN 12  6 - 23 mg/dL   Creatinine, Ser 7.82  0.50 - 1.35 mg/dL   Calcium 9.4  8.4 - 95.6 mg/dL   Total Protein 7.7  6.0 - 8.3 g/dL   Albumin 4.2  3.5 - 5.2 g/dL   AST 39 (*) 0 - 37 U/L   ALT 57 (*) 0 - 53 U/L   Alkaline Phosphatase 68  39 - 117 U/L   Total Bilirubin 0.7  0.3 - 1.2 mg/dL   GFR calc non Af Amer 86 (*) >90 mL/min   GFR calc Af Amer >90  >90 mL/min   Comment:            The eGFR has been calculated     using the CKD EPI equation.     This calculation has not been     validated in all  clinical     situations.     eGFR's persistently     <90 mL/min signify     possible Chronic Kidney Disease.  LIPASE, BLOOD     Status: Abnormal   Collection Time    10/03/12 10:30 AM      Result Value Range   Lipase 766 (*) 11 - 59 U/L   Comment: RESULT CONFIRMED BY AUTOMATED DILUTION  TROPONIN I     Status: None   Collection Time    10/03/12 10:30 AM      Result Value Range   Troponin I <0.30  <0.30 ng/mL   Comment:            Due to the release kinetics of cTnI,     a negative result within the first hours     of the onset of symptoms does not rule out     myocardial infarction with certainty.     If myocardial infarction is still suspected,     repeat the test at appropriate intervals.  BASIC METABOLIC PANEL WITH GFR     Status: Abnormal   Collection Time    10/05/12  3:34 PM       Result Value Range   Sodium 135  135 - 145 mEq/L   Potassium 4.8  3.5 - 5.3 mEq/L   Chloride 94 (*) 96 - 112 mEq/L   CO2 31  19 - 32 mEq/L   Glucose, Bld 184 (*) 70 - 99 mg/dL   BUN 9  6 - 23 mg/dL   Creat 4.09  8.11 - 9.14 mg/dL   Calcium 9.8  8.4 - 78.2 mg/dL   GFR, Est African American >89     GFR, Est Non African American >89     Comment:       The estimated GFR is a calculation valid for adults (>=69 years old)     that uses the CKD-EPI algorithm to adjust for age and sex. It is       not to be used for children, pregnant women, hospitalized patients,        patients on dialysis, or with rapidly changing kidney function.     According to the NKDEP, eGFR >89 is normal, 60-89 shows mild     impairment, 30-59 shows moderate impairment, 15-29 shows severe     impairment and <15 is ESRD.        CBC WITH DIFFERENTIAL     Status: None   Collection Time    10/05/12  3:34 PM      Result Value Range   WBC 7.8  4.0 - 10.5 K/uL   RBC 5.40  4.22 - 5.81 MIL/uL   Hemoglobin 15.6  13.0 - 17.0 g/dL   HCT 95.6  21.3 - 08.6 %   MCV 83.3  78.0 - 100.0 fL   MCH 28.9  26.0 - 34.0 pg   MCHC 34.7  30.0 - 36.0 g/dL   RDW 57.8  46.9 - 62.9 %   Platelets 329  150 - 400 K/uL   Neutrophils Relative % 61  43 - 77 %   Neutro Abs 4.7  1.7 - 7.7 K/uL   Lymphocytes Relative 28  12 - 46 %   Lymphs Abs 2.2  0.7 - 4.0 K/uL   Monocytes Relative 9  3 - 12 %   Monocytes Absolute 0.7  0.1 - 1.0 K/uL   Eosinophils Relative 2  0 - 5 %   Eosinophils Absolute  0.2  0.0 - 0.7 K/uL   Basophils Relative 0  0 - 1 %   Basophils Absolute 0.0  0.0 - 0.1 K/uL   Smear Review Criteria for review not met    LIPASE     Status: Abnormal   Collection Time    10/05/12  3:34 PM      Result Value Range   Lipase 132 (*) 0 - 75 U/L  HEPATIC FUNCTION PANEL     Status: Abnormal   Collection Time    10/05/12  3:34 PM      Result Value Range   Total Bilirubin 1.0  0.3 - 1.2 mg/dL   Bilirubin, Direct 0.2  0.0 - 0.3 mg/dL    Indirect Bilirubin 0.8  0.0 - 0.9 mg/dL   Alkaline Phosphatase 63  39 - 117 U/L   AST 158 (*) 0 - 37 U/L   ALT 121 (*) 0 - 53 U/L   Total Protein 7.8  6.0 - 8.3 g/dL   Albumin 4.5  3.5 - 5.2 g/dL  LIPASE     Status: None   Collection Time    10/09/12  3:22 PM      Result Value Range   Lipase 21  0 - 75 U/L  HEPATIC FUNCTION PANEL     Status: Abnormal   Collection Time    10/09/12  3:22 PM      Result Value Range   Total Bilirubin 0.6  0.3 - 1.2 mg/dL   Bilirubin, Direct 0.2  0.0 - 0.3 mg/dL   Indirect Bilirubin 0.4  0.0 - 0.9 mg/dL   Alkaline Phosphatase 68  39 - 117 U/L   AST 38 (*) 0 - 37 U/L   ALT 79 (*) 0 - 53 U/L   Total Protein 7.0  6.0 - 8.3 g/dL   Albumin 4.6  3.5 - 5.2 g/dL  CBC WITH DIFFERENTIAL     Status: None   Collection Time    10/09/12  3:26 PM      Result Value Range   WBC 7.7  4.0 - 10.5 K/uL   RBC 4.82  4.22 - 5.81 MIL/uL   Hemoglobin 14.1  13.0 - 17.0 g/dL   HCT 16.1  09.6 - 04.5 %   MCV 83.4  78.0 - 100.0 fL   MCH 29.3  26.0 - 34.0 pg   MCHC 35.1  30.0 - 36.0 g/dL   RDW 40.9  81.1 - 91.4 %   Platelets 280  150 - 400 K/uL   Neutrophils Relative % 68  43 - 77 %   Neutro Abs 5.2  1.7 - 7.7 K/uL   Lymphocytes Relative 25  12 - 46 %   Lymphs Abs 1.9  0.7 - 4.0 K/uL   Monocytes Relative 6  3 - 12 %   Monocytes Absolute 0.5  0.1 - 1.0 K/uL   Eosinophils Relative 1  0 - 5 %   Eosinophils Absolute 0.1  0.0 - 0.7 K/uL   Basophils Relative 0  0 - 1 %   Basophils Absolute 0.0  0.0 - 0.1 K/uL   Smear Review Criteria for review not met    CBC WITH DIFFERENTIAL     Status: None   Collection Time    12/29/12 10:50 PM      Result Value Range   WBC 6.9  4.0 - 10.5 K/uL   RBC 4.74  4.22 - 5.81 MIL/uL   Hemoglobin 14.3  13.0 - 17.0 g/dL  HCT 39.9  39.0 - 52.0 %   MCV 84.2  78.0 - 100.0 fL   MCH 30.2  26.0 - 34.0 pg   MCHC 35.8  30.0 - 36.0 g/dL   RDW 16.1  09.6 - 04.5 %   Platelets 343  150 - 400 K/uL   Neutrophils Relative % 57  43 - 77 %   Neutro Abs  3.9  1.7 - 7.7 K/uL   Lymphocytes Relative 33  12 - 46 %   Lymphs Abs 2.3  0.7 - 4.0 K/uL   Monocytes Relative 8  3 - 12 %   Monocytes Absolute 0.5  0.1 - 1.0 K/uL   Eosinophils Relative 2  0 - 5 %   Eosinophils Absolute 0.1  0.0 - 0.7 K/uL   Basophils Relative 1  0 - 1 %   Basophils Absolute 0.1  0.0 - 0.1 K/uL  COMPREHENSIVE METABOLIC PANEL     Status: Abnormal   Collection Time    12/29/12 10:50 PM      Result Value Range   Sodium 139  135 - 145 mEq/L   Potassium 3.3 (*) 3.5 - 5.1 mEq/L   Chloride 96  96 - 112 mEq/L   CO2 25  19 - 32 mEq/L   Glucose, Bld 388 (*) 70 - 99 mg/dL   BUN 6  6 - 23 mg/dL   Creatinine, Ser 4.09  0.50 - 1.35 mg/dL   Calcium 9.7  8.4 - 81.1 mg/dL   Total Protein 7.2  6.0 - 8.3 g/dL   Albumin 3.9  3.5 - 5.2 g/dL   AST 54 (*) 0 - 37 U/L   ALT 82 (*) 0 - 53 U/L   Alkaline Phosphatase 97  39 - 117 U/L   Total Bilirubin 0.4  0.3 - 1.2 mg/dL   GFR calc non Af Amer >90  >90 mL/min   GFR calc Af Amer >90  >90 mL/min   Comment: (NOTE)     The eGFR has been calculated using the CKD EPI equation.     This calculation has not been validated in all clinical situations.     eGFR's persistently <90 mL/min signify possible Chronic Kidney     Disease.  LIPASE, BLOOD     Status: Abnormal   Collection Time    12/29/12 10:50 PM      Result Value Range   Lipase 432 (*) 11 - 59 U/L  ETHANOL     Status: Abnormal   Collection Time    12/29/12 10:50 PM      Result Value Range   Alcohol, Ethyl (B) 83 (*) 0 - 11 mg/dL   Comment:            LOWEST DETECTABLE LIMIT FOR     SERUM ALCOHOL IS 11 mg/dL     FOR MEDICAL PURPOSES ONLY   Assessment/Plan: Pancreatitis, alcoholic Patient still with epigastric pain, improved from 2 days ago.  Refill Vicodin 5-325 # 10 no refill.  Patient to lengthen interval between dosages as pain improves.  Encourage alternating tylenol and vicodin for pain.  Clear liquid diet.  Advance diet as tolerated.  Patient with no N/V today. Has Rx for  Zofran as needed. Recheck labs today.  Will call patient with results. Patient educated on red flag symptoms and when to proceed to the ED.  15 minutes spent on alcohol cessation education and the prognosis of alcoholic pancreatitis, including progression to pancreatic fibrosis and increased risk for pancreatic  cancer.  Will try to find free or reduced-cost counseling resources for patient.  Patient is to follow-up in 1 week with myself or NP Peggyann Juba.  Alcohol abuse Patient with recurrent pancreatitis 2/2 alcohol abuse.  Patient currently in [place]. Has  Been to counseling in the past but cannot afford it currently. 15 minutes spent on alcohol cessation and risks associated with alcohol abuse.  Patient verbalizes understanding of his situation.  Will attempt to find resources for patient to receive counseling or rehab services that are reduced-cost or free to him.  Alcohol cessation will be integral to his continued health, as many of his comorbid conditions are exacerbated by alcohol consumption.  Depression with anxiety Patient states overall his depression and anxiety are controlled with daily cymbalta and xanax.  However patient reports his main coping mechanism is drinking alcohol, and he does not have money to afford counseling sessions.  Encouraged alcohol cessation and spoke with the patient about finding healthier coping mechanisms for his depression -- such as daily exercise, watching his favorite movies, or spending 15 minutes a day doing something he enjoys doing -- to avoid returning to alcohol.  Will attempt to find cost-effective resources for the patient.  HYPERTENSION Patient with elevated bp today in clinic.  Patient endorses not taking his BP medications this am prior to visit.  Patient to restart BP medications as prescribed.  Will see him in 1 week for follow-up for pancreatitis.  Will recheck BP at that visit.

## 2012-12-31 NOTE — Assessment & Plan Note (Signed)
Patient still with epigastric pain, improved from 2 days ago.  Refill Vicodin 5-325 # 10 no refill.  Patient to lengthen interval between dosages as pain improves.  Encourage alternating tylenol and vicodin for pain.  Clear liquid diet.  Advance diet as tolerated.  Patient with no N/V today. Has Rx for Zofran as needed. Recheck labs today.  Will call patient with results. Patient educated on red flag symptoms and when to proceed to the ED.  15 minutes spent on alcohol cessation education and the prognosis of alcoholic pancreatitis, including progression to pancreatic fibrosis and increased risk for pancreatic cancer.  Will try to find free or reduced-cost counseling resources for patient.  Patient is to follow-up in 1 week with myself or NP Peggyann Juba.

## 2012-12-31 NOTE — Patient Instructions (Signed)
Please continue clear liquid diet. You can move towards a diet of soft foods as the pain subsides and you are able to tolerate them.  Continue pain pills as needed for pain.  Try to take 1 pill every four hours.  Try to use tylenol intermittently to cut down on vicodin use.  Need to see you in 1 week for reassessment.  If your pain worsens, please call us or proceed to the ED.  I will try to find resources for your for free or reduced-cost counseling.  Acute Pancreatitis Acute pancreatitis is a disease in which the pancreas becomes suddenly inflamed. The pancreas is a large gland located behind your stomach. The pancreas produces enzymes that help digest food. The pancreas also releases the hormones glucagon and insulin that help regulate blood sugar. Damage to the pancreas occurs when the digestive enzymes from the pancreas are activated and begin attacking the pancreas before being released into the intestine. Most acute attacks last a couple of days and can cause serious complications. Some people become dehydrated and develop low blood pressure. In severe cases, bleeding into the pancreas can lead to shock and can be life-threatening. The lungs, heart, and kidneys may fail. CAUSES  Pancreatitis can happen to anyone. In some cases, the cause is unknown. Most cases are caused by:  Alcohol abuse.  Gallstones. Other less common causes are:  Certain medicines.  Exposure to certain chemicals.  Infection.  Damage caused by an accident (trauma).  Abdominal surgery. SYMPTOMS   Pain in the upper abdomen that may radiate to the back.  Tenderness and swelling of the abdomen.  Nausea and vomiting. DIAGNOSIS  Your caregiver will perform a physical exam. Blood and stool tests may be done to confirm the diagnosis. Imaging tests may also be done, such as X-rays, CT scans, or an ultrasound of the abdomen. TREATMENT  Treatment usually requires a stay in the hospital. Treatment may include:  Pain  medicine.  Fluid replacement through an intravenous line (IV).  Placing a tube in the stomach to remove stomach contents and control vomiting.  Not eating for 3 or 4 days. This gives your pancreas a rest, because enzymes are not being produced that can cause further damage.  Antibiotic medicines if your condition is caused by an infection.  Surgery of the pancreas or gallbladder. HOME CARE INSTRUCTIONS   Follow the diet advised by your caregiver. This may involve avoiding alcohol and decreasing the amount of fat in your diet.  Eat smaller, more frequent meals. This reduces the amount of digestive juices the pancreas produces.  Drink enough fluids to keep your urine clear or pale yellow.  Only take over-the-counter or prescription medicines as directed by your caregiver.  Avoid drinking alcohol if it caused your condition.  Do not smoke.  Get plenty of rest.  Check your blood sugar at home as directed by your caregiver.  Keep all follow-up appointments as directed by your caregiver. SEEK MEDICAL CARE IF:   You do not recover as quickly as expected.  You develop new or worsening symptoms.  You have persistent pain, weakness, or nausea.  You recover and then have another episode of pain. SEEK IMMEDIATE MEDICAL CARE IF:   You are unable to eat or keep fluids down.  Your pain becomes severe.  You have a fever or persistent symptoms for more than 2 to 3 days.  You have a fever and your symptoms suddenly get worse.  Your skin or the white part of  your eyes turn yellow (jaundice).  You develop vomiting.  You feel dizzy, or you faint.  Your blood sugar is high (over 300 mg/dL). MAKE SURE YOU:   Understand these instructions.  Will watch your condition.  Will get help right away if you are not doing well or get worse. Document Released: 04/15/2005 Document Revised: 10/15/2011 Document Reviewed: 07/25/2011 [facility] Patient Information 2014 [place], [place].

## 2013-01-07 ENCOUNTER — Ambulatory Visit: Payer: 59 | Admitting: Physician Assistant

## 2013-01-11 ENCOUNTER — Encounter: Payer: Self-pay | Admitting: Physician Assistant

## 2013-01-11 ENCOUNTER — Ambulatory Visit (INDEPENDENT_AMBULATORY_CARE_PROVIDER_SITE_OTHER): Payer: 59 | Admitting: Physician Assistant

## 2013-01-11 ENCOUNTER — Other Ambulatory Visit: Payer: Self-pay | Admitting: Family

## 2013-01-11 VITALS — BP 138/90 | HR 90 | Temp 97.7°F | Resp 16 | Ht 70.5 in | Wt 242.5 lb

## 2013-01-11 DIAGNOSIS — Z23 Encounter for immunization: Secondary | ICD-10-CM

## 2013-01-11 DIAGNOSIS — F418 Other specified anxiety disorders: Secondary | ICD-10-CM

## 2013-01-11 DIAGNOSIS — F341 Dysthymic disorder: Secondary | ICD-10-CM

## 2013-01-11 DIAGNOSIS — F329 Major depressive disorder, single episode, unspecified: Secondary | ICD-10-CM

## 2013-01-11 DIAGNOSIS — K859 Acute pancreatitis without necrosis or infection, unspecified: Secondary | ICD-10-CM

## 2013-01-11 DIAGNOSIS — F1010 Alcohol abuse, uncomplicated: Secondary | ICD-10-CM

## 2013-01-11 DIAGNOSIS — K852 Alcohol induced acute pancreatitis without necrosis or infection: Secondary | ICD-10-CM

## 2013-01-11 LAB — COMPREHENSIVE METABOLIC PANEL
BUN: 10 mg/dL (ref 6–23)
CO2: 29 mEq/L (ref 19–32)
Calcium: 10.5 mg/dL (ref 8.4–10.5)
Chloride: 95 mEq/L — ABNORMAL LOW (ref 96–112)
Creat: 0.9 mg/dL (ref 0.50–1.35)
Glucose, Bld: 199 mg/dL — ABNORMAL HIGH (ref 70–99)

## 2013-01-11 LAB — LIPASE: Lipase: 12 U/L (ref 0–75)

## 2013-01-11 MED ORDER — ALPRAZOLAM 0.5 MG PO TABS: ORAL_TABLET | ORAL | Status: DC

## 2013-01-11 NOTE — Assessment & Plan Note (Signed)
Refill alprazolam.  Additional refills to be given depending on follow-up with Peggyann Juba who manages this condition.

## 2013-01-11 NOTE — Patient Instructions (Signed)
Please obtain labs.  Will call you with results.  Take alprazolam as prescribed.  Further refills will need to come from your PCP, Willie [name].

## 2013-01-11 NOTE — Progress Notes (Signed)
Patient ID: Willie [name], male   DOB: 1975/09/11, 37 y.o.   MRN: [id number]  Patient presents to clinic today for 2nd follow-up of alcoholic pancreatitis.  Patient has been doing well since last visit.  Endorses pain has resolved.  Denies nausea, vomiting.  Stopped taking pain medicines two days after last visit.  Is up to normal diet.    Patient endorses increase in anxiety after running out of his xanax last Friday.  Denies alcohol consumption. Has been trying to find other ways to cope with his anxiety.  Is still searching for employment, which he states is the main source of current anxiety.  Denies SI/HI.  Past Medical History  Diagnosis Date  . Alcoholic pancreatitis     recurrent  . Diabetes mellitus, type II     New Onset 03/2010  . Hypertension   . 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 High cholesterol   . Sleep apnea   . GERD (gastroesophageal reflux disease)   . Depression     Current Outpatient Prescriptions on File Prior to Visit  Medication Sig Dispense Refill  . atorvastatin (LIPITOR) 10 MG tablet Take 1 tablet (10 mg total) by mouth daily.  90 tablet  0  . bisoprolol (ZEBETA) 10 MG tablet Take 1 tablet (10 mg total) by mouth daily.  90 tablet  1  . DULoxetine (CYMBALTA) 60 MG capsule Take 60 mg by mouth daily.      Marland Kitchen glucose blood (ONE TOUCH TEST STRIPS) test strip 1 each. Use to check blood sugar once day      . HYDROcodone-acetaminophen (NORCO/VICODIN) 5-325 MG per tablet Take 1 tablet by mouth every 4 (four) hours as needed for pain.  10 tablet  0  . multivitamin (ONE-A-DAY MEN'S) TABS Take 1 tablet by mouth daily.        Marland Kitchen olmesartan-hydrochlorothiazide (BENICAR HCT) 40-25 MG per tablet Take 1 tablet by mouth daily.  90 tablet  1  . ONE TOUCH LANCETS MISC by Does not apply route. Use to check blood sugar once a day       . pantoprazole (PROTONIX) 40 MG tablet TAKE 1 TABLET (40 MG TOTAL) BY MOUTH DAILY.  30  tablet  3  . sildenafil (VIAGRA) 50 MG tablet Take 1 tablet (50 mg total) by mouth daily as needed for erectile dysfunction.  10 tablet  2  . valACYclovir (VALTREX) 500 MG tablet One tablet twice daily for 3 days. Start at first sign of breakout.  12 tablet  5   No current facility-administered medications on file prior to visit.    No Known Allergies  Family History  Problem Relation Age of Onset  . Diabetes Mother   . Lung cancer Brother     twin brother  . Colon cancer Neg Hx   . Pancreatic cancer Paternal Aunt     History   Social History  . Marital Status: Married    Spouse Name: N/A    Number of Children: 2  . 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 Smoking status: Never Smoker   . Smokeless tobacco: Never Used  . Alcohol Use: 12.0 oz/week    15 Cans of beer, 5 Shots of liquor per week     Comment: 08/04/2012 "maybe 15 beers/wk; I don't drink qd"   12/31/2012 "  switched to mostly liquor, but trying to cut back"  . Drug Use: No  . Sexual Activity: Yes   Other Topics Concern  . 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  Gastrointestinal: Negative for heartburn, nausea, vomiting, abdominal pain, diarrhea, constipation, blood in stool and melena.  Neurological: Negative for dizziness, loss of consciousness and headaches.  Psychiatric/Behavioral: Positive for depression and substance abuse. Negative for suicidal ideas and hallucinations. The patient is nervous/anxious.    Filed Vitals:   01/11/13 1031  BP: 138/90  Pulse: 90  Temp: 97.7 F (36.5 C)  Resp: 16   Physical Exam  Vitals reviewed. Constitutional: He is oriented  to person, place, and time and well-developed, well-nourished, and in no distress.  HENT:  Head: Normocephalic and atraumatic.  Eyes: Conjunctivae are normal.  Cardiovascular: Normal rate, regular rhythm and normal heart sounds.   Pulmonary/Chest: Effort normal and breath sounds normal.  Abdominal: Soft. Bowel sounds are normal. He exhibits no distension and no mass. There is no tenderness. There is no rebound and no guarding.  Neurological: He is alert and oriented to person, place, and time.  Skin: Skin is warm and dry. No rash noted.  Psychiatric: Affect normal.  Patient seems much more talkative and smiling which is drastically different from last examination where patient exhibited a very flat affect.   Recent Results (from the past 2160 hour(s))  CBC WITH DIFFERENTIAL     Status: None   Collection Time    12/29/12 10:50 PM      Result Value Range   WBC 6.9  4.0 - 10.5 K/uL   RBC 4.74  4.22 - 5.81 MIL/uL   Hemoglobin 14.3  13.0 - 17.0 g/dL   HCT 21.3  08.6 - 57.8 %   MCV 84.2  78.0 - 100.0 fL   MCH 30.2  26.0 - 34.0 pg   MCHC 35.8  30.0 - 36.0 g/dL   RDW 46.9  62.9 - 52.8 %   Platelets 343  150 - 400 K/uL   Neutrophils Relative % 57  43 - 77 %   Neutro Abs 3.9  1.7 - 7.7 K/uL   Lymphocytes Relative 33  12 - 46 %   Lymphs Abs 2.3  0.7 - 4.0 K/uL   Monocytes Relative 8  3 - 12 %   Monocytes Absolute 0.5  0.1 - 1.0 K/uL   Eosinophils Relative 2  0 - 5 %   Eosinophils Absolute 0.1  0.0 - 0.7 K/uL   Basophils Relative 1  0 - 1 %   Basophils Absolute 0.1  0.0 - 0.1 K/uL  COMPREHENSIVE METABOLIC PANEL     Status: Abnormal   Collection Time    12/29/12 10:50 PM      Result Value Range   Sodium 139  135 - 145 mEq/L   Potassium 3.3 (*) 3.5 - 5.1 mEq/L   Chloride 96  96 - 112 mEq/L   CO2 25  19 - 32 mEq/L   Glucose, Bld 388 (*) 70 - 99 mg/dL   BUN 6  6 - 23 mg/dL   Creatinine, Ser 4.13  0.50 - 1.35  mg/dL   Calcium 9.7  8.4 - 78.2 mg/dL   Total Protein 7.2  6.0 - 8.3 g/dL    Albumin 3.9  3.5 - 5.2 g/dL   AST 54 (*) 0 - 37 U/L   ALT 82 (*) 0 - 53 U/L   Alkaline Phosphatase 97  39 - 117 U/L   Total Bilirubin 0.4  0.3 - 1.2 mg/dL   GFR calc non Af Amer >90  >90 mL/min   GFR calc Af Amer >90  >90 mL/min   Comment: (NOTE)     The eGFR has been calculated using the CKD EPI equation.     This calculation has not been validated in all clinical situations.     eGFR's persistently <90 mL/min signify possible Chronic Kidney     Disease.  LIPASE, BLOOD     Status: Abnormal   Collection Time    12/29/12 10:50 PM      Result Value Range   Lipase 432 (*) 11 - 59 U/L  ETHANOL     Status: Abnormal   Collection Time    12/29/12 10:50 PM      Result Value Range   Alcohol, Ethyl (B) 83 (*) 0 - 11 mg/dL   Comment:            LOWEST DETECTABLE LIMIT FOR     SERUM ALCOHOL IS 11 mg/dL     FOR MEDICAL PURPOSES ONLY  COMPREHENSIVE METABOLIC PANEL     Status: Abnormal   Collection Time    12/31/12 11:31 AM      Result Value Range   Sodium 135  135 - 145 mEq/L   Potassium 3.9  3.5 - 5.3 mEq/L   Chloride 100  96 - 112 mEq/L   CO2 27  19 - 32 mEq/L   Glucose, Bld 207 (*) 70 - 99 mg/dL   BUN 5 (*) 6 - 23 mg/dL   Creat 9.56  2.13 - 0.86 mg/dL   Total Bilirubin 1.0  0.3 - 1.2 mg/dL   Alkaline Phosphatase 77  39 - 117 U/L   AST 69 (*) 0 - 37 U/L   ALT 72 (*) 0 - 53 U/L   Total Protein 6.8  6.0 - 8.3 g/dL   Albumin 4.2  3.5 - 5.2 g/dL   Calcium 9.3  8.4 - 57.8 mg/dL  LIPASE     Status: Abnormal   Collection Time    12/31/12 11:31 AM      Result Value Range   Lipase 161 (*) 0 - 75 U/L  CBC WITH DIFFERENTIAL     Status: None   Collection Time    12/31/12 11:31 AM      Result Value Range   WBC 5.4  4.0 - 10.5 K/uL   RBC 4.73  4.22 - 5.81 MIL/uL   Hemoglobin 13.9  13.0 - 17.0 g/dL   HCT 46.9  62.9 - 52.8 %   MCV 85.8  78.0 - 100.0 fL   MCH 29.4  26.0 - 34.0 pg   MCHC 34.2  30.0 - 36.0 g/dL   RDW 41.3  24.4 - 01.0 %   Platelets 357  150 - 400 K/uL   Neutrophils  Relative % 63  43 - 77 %   Neutro Abs 3.4  1.7 - 7.7 K/uL   Lymphocytes Relative 26  12 - 46 %   Lymphs Abs 1.4  0.7 - 4.0 K/uL   Monocytes Relative 8  3 - 12 %  Monocytes Absolute 0.4  0.1 - 1.0 K/uL   Eosinophils Relative 3  0 - 5 %   Eosinophils Absolute 0.2  0.0 - 0.7 K/uL   Basophils Relative 0  0 - 1 %   Basophils Absolute 0.0  0.0 - 0.1 K/uL   Smear Review Criteria for review not met     Assessment/Plan: Pancreatitis, alcoholic Will recheck labs today.  Patient asymptomatic.  Encourage continual alcohol cessation.  Spent 10 minutes again discussing consequences of alcohol abuse.  Depression with anxiety Refill alprazolam.  Additional refills to be given depending on follow-up with Peggyann Juba who manages this condition.  Alcohol abuse Patient denies alcohol use after his last ED visit.  States he does not keep any alcohol in the house as to avoid temptation.  Discussed return to [employer]  Discusses prognosis of continual alcohol consumption.

## 2013-01-11 NOTE — Assessment & Plan Note (Signed)
Patient denies alcohol use after his last ED visit.  States he does not keep any alcohol in the house as to avoid temptation.  Discussed return to [employer]  Discusses prognosis of continual alcohol consumption.

## 2013-01-11 NOTE — Assessment & Plan Note (Signed)
Will recheck labs today.  Patient asymptomatic.  Encourage continual alcohol cessation.  Spent 10 minutes again discussing consequences of alcohol abuse.

## 2013-02-10 ENCOUNTER — Encounter (HOSPITAL_COMMUNITY): Payer: Self-pay | Admitting: Emergency Medicine

## 2013-02-10 ENCOUNTER — Inpatient Hospital Stay (HOSPITAL_COMMUNITY)
Admission: EM | Admit: 2013-02-10 | Discharge: 2013-02-12 | DRG: 918 | Disposition: A | Discharge status: Other Acute Inpatient Hospital | Payer: 59 | Attending: Internal Medicine | Admitting: Internal Medicine

## 2013-02-10 DIAGNOSIS — T394X2A Poisoning by antirheumatics, not elsewhere classified, intentional self-harm, initial encounter: Secondary | ICD-10-CM | POA: Diagnosis present

## 2013-02-10 DIAGNOSIS — F418 Other specified anxiety disorders: Secondary | ICD-10-CM | POA: Diagnosis present

## 2013-02-10 DIAGNOSIS — E1165 Type 2 diabetes mellitus with hyperglycemia: Secondary | ICD-10-CM | POA: Diagnosis present

## 2013-02-10 DIAGNOSIS — T424X4A Poisoning by benzodiazepines, undetermined, initial encounter: Principal | ICD-10-CM | POA: Diagnosis present

## 2013-02-10 DIAGNOSIS — T400X1A Poisoning by opium, accidental (unintentional), initial encounter: Secondary | ICD-10-CM | POA: Diagnosis present

## 2013-02-10 DIAGNOSIS — I10 Essential (primary) hypertension: Secondary | ICD-10-CM | POA: Diagnosis present

## 2013-02-10 DIAGNOSIS — E785 Hyperlipidemia, unspecified: Secondary | ICD-10-CM | POA: Diagnosis present

## 2013-02-10 DIAGNOSIS — T43591A Poisoning by other antipsychotics and neuroleptics, accidental (unintentional), initial encounter: Secondary | ICD-10-CM

## 2013-02-10 DIAGNOSIS — E119 Type 2 diabetes mellitus without complications: Secondary | ICD-10-CM | POA: Diagnosis present

## 2013-02-10 DIAGNOSIS — K219 Gastro-esophageal reflux disease without esophagitis: Secondary | ICD-10-CM | POA: Diagnosis present

## 2013-02-10 DIAGNOSIS — R45851 Suicidal ideations: Secondary | ICD-10-CM

## 2013-02-10 DIAGNOSIS — T6592XA Toxic effect of unspecified substance, intentional self-harm, initial encounter: Secondary | ICD-10-CM

## 2013-02-10 DIAGNOSIS — F1010 Alcohol abuse, uncomplicated: Secondary | ICD-10-CM

## 2013-02-10 DIAGNOSIS — T43502A Poisoning by unspecified antipsychotics and neuroleptics, intentional self-harm, initial encounter: Secondary | ICD-10-CM | POA: Diagnosis present

## 2013-02-10 DIAGNOSIS — I959 Hypotension, unspecified: Secondary | ICD-10-CM | POA: Diagnosis present

## 2013-02-10 DIAGNOSIS — F329 Major depressive disorder, single episode, unspecified: Secondary | ICD-10-CM | POA: Diagnosis present

## 2013-02-10 DIAGNOSIS — IMO0002 Reserved for concepts with insufficient information to code with codable children: Secondary | ICD-10-CM | POA: Diagnosis present

## 2013-02-10 DIAGNOSIS — T424X1A Poisoning by benzodiazepines, accidental (unintentional), initial encounter: Secondary | ICD-10-CM

## 2013-02-10 DIAGNOSIS — F3289 Other specified depressive episodes: Secondary | ICD-10-CM | POA: Diagnosis present

## 2013-02-10 DIAGNOSIS — F411 Generalized anxiety disorder: Secondary | ICD-10-CM | POA: Diagnosis present

## 2013-02-10 DIAGNOSIS — G4730 Sleep apnea, unspecified: Secondary | ICD-10-CM | POA: Diagnosis present

## 2013-02-10 DIAGNOSIS — T50901A Poisoning by unspecified drugs, medicaments and biological substances, accidental (unintentional), initial encounter: Secondary | ICD-10-CM

## 2013-02-10 DIAGNOSIS — E7800 Pure hypercholesterolemia, unspecified: Secondary | ICD-10-CM | POA: Diagnosis present

## 2013-02-10 DIAGNOSIS — F332 Major depressive disorder, recurrent severe without psychotic features: Secondary | ICD-10-CM

## 2013-02-10 DIAGNOSIS — Z79899 Other long term (current) drug therapy: Secondary | ICD-10-CM

## 2013-02-10 DIAGNOSIS — Z8679 Personal history of other diseases of the circulatory system: Secondary | ICD-10-CM

## 2013-02-10 LAB — CBC WITH DIFFERENTIAL/PLATELET
Basophils Relative: 0 % (ref 0–1)
HCT: 35.9 % — ABNORMAL LOW (ref 39.0–52.0)
Hemoglobin: 12.5 g/dL — ABNORMAL LOW (ref 13.0–17.0)
Lymphs Abs: 2 10*3/uL (ref 0.7–4.0)
MCHC: 34.8 g/dL (ref 30.0–36.0)
Monocytes Absolute: 0.4 10*3/uL (ref 0.1–1.0)
Monocytes Relative: 5 % (ref 3–12)
Neutro Abs: 4.6 10*3/uL (ref 1.7–7.7)
Neutrophils Relative %: 66 % (ref 43–77)
RBC: 4.27 MIL/uL (ref 4.22–5.81)

## 2013-02-10 LAB — BLOOD GAS, ARTERIAL
Acid-base deficit: 3.9 mmol/L — ABNORMAL HIGH (ref 0.0–2.0)
Drawn by: 308601
TCO2: 19.1 mmol/L (ref 0–100)
pCO2 arterial: 40.1 mmHg (ref 35.0–45.0)

## 2013-02-10 LAB — URINE MICROSCOPIC-ADD ON: Urine-Other: NONE SEEN

## 2013-02-10 LAB — URINALYSIS, ROUTINE W REFLEX MICROSCOPIC
Nitrite: NEGATIVE
Protein, ur: NEGATIVE mg/dL
Specific Gravity, Urine: 1.021 (ref 1.005–1.030)
Urobilinogen, UA: 0.2 mg/dL (ref 0.0–1.0)

## 2013-02-10 LAB — BASIC METABOLIC PANEL
BUN: 6 mg/dL (ref 6–23)
Chloride: 99 mEq/L (ref 96–112)
GFR calc Af Amer: >90 mL/min (ref >90)
Glucose, Bld: 335 mg/dL — ABNORMAL HIGH (ref 70–99)
Potassium: 3.6 mEq/L (ref 3.5–5.1)
Sodium: 134 mEq/L — ABNORMAL LOW (ref 135–145)

## 2013-02-10 LAB — RAPID URINE DRUG SCREEN, HOSP PERFORMED
Cocaine: NOT DETECTED
Opiates: NOT DETECTED

## 2013-02-10 LAB — CG4 I-STAT (LACTIC ACID): Lactic Acid, Venous: 4.34 mmol/L — ABNORMAL HIGH (ref 0.5–2.2)

## 2013-02-10 MED ORDER — ONDANSETRON HCL 4 MG/2ML IJ SOLN
4.0000 mg | Freq: Once | INTRAMUSCULAR | Status: AC
Start: 2013-02-10 — End: 2013-02-10
  Administered 2013-02-10: 4 mg via INTRAVENOUS
  Filled 2013-02-10: qty 2

## 2013-02-10 MED ORDER — ACETAMINOPHEN 325 MG PO TABS: 650.0000 mg | ORAL_TABLET | Freq: Once | ORAL | Status: DC

## 2013-02-10 MED ORDER — SODIUM CHLORIDE 0.9 % IV BOLUS (SEPSIS)
1000.0000 mL | Freq: Once | INTRAVENOUS | Status: AC
  Administered 2013-02-10: 1000 mL via INTRAVENOUS

## 2013-02-10 MED ORDER — HALOPERIDOL LACTATE 5 MG/ML IJ SOLN
5.0000 mg | Freq: Once | INTRAMUSCULAR | Status: DC
  Filled 2013-02-10: qty 1

## 2013-02-10 NOTE — ED Notes (Signed)
Notified RN, Hamby pt. CG$ i-stat results 4.34.

## 2013-02-10 NOTE — ED Notes (Signed)
bp remains steady.  Family and sitter with pt.  Pt alert and verbalizing at this time.

## 2013-02-10 NOTE — ED Notes (Signed)
Pt brother and sister at bedside.

## 2013-02-10 NOTE — ED Notes (Signed)
Security and staff remaining in room.

## 2013-02-10 NOTE — H&P (Signed)
[facility] History and Physical  Willie [name] ZOX:[id number] DOB: 06/20/1975    PCP:   Lemont Fillers., NP   Chief Complaint: Suicidal attempt by overdosing on Xanax.  HPI: Willie [name] is an 37 y.o. male with hx of DM2, depression and anxiety, sleep apnea, hyperlipidemia, reportedly overdose on Xanax 0.5mg , all 30 tablets.  He has marital problems at home and reportedly problem with child custody. He also has hx of HTN and has been on Benacar/ Hct.  He hadn't taken any other medicine or drugs.  In the ER, he was found somnolent, but arousable.  Evaluation showed hypotension with SBP around 90, not tachycardic.  His UDS was negative, with normal tylenol and ASA level.  He has normal renal fx, electrolytes, and BS of 400's. Poison control advised supportive care.  He has been able to maintain his oral airways and breathing adequately.  Rewiew of Systems:  ROS is unreliable.      Past Medical History  Diagnosis Date  . Alcoholic pancreatitis     recurrent  . Diabetes mellitus, type II     New Onset 03/2010  . Hypertension   . 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 High cholesterol   . Sleep apnea   . GERD (gastroesophageal reflux disease)   . Depression     Past Surgical History  Procedure Laterality Date  . Lumbar microdiscectomy  ~ 2004    Dr Danielle Dess    Medications:  HOME MEDS: Prior to Admission medications   Medication Sig Start Date End Date Taking? Authorizing Provider  ALPRAZolam Prudy Feeler) 0.5 MG tablet One tablet by mouth twice daily as needed. 01/11/13   Piedad Climes, PA-C  atorvastatin (LIPITOR) 10 MG tablet Take 1 tablet (10 mg total) by mouth daily. 12/21/12   Sandford Craze, NP  bisoprolol (ZEBETA) 10 MG tablet Take 1 tablet (10 mg total) by mouth daily. 06/22/12   Sandford Craze, NP  DULoxetine (CYMBALTA) 60 MG capsule Take 60 mg by mouth daily.    Historical Provider, MD   glucose blood (ONE TOUCH TEST STRIPS) test strip 1 each. Use to check blood sugar once day    Historical Provider, MD  HYDROcodone-acetaminophen (NORCO/VICODIN) 5-325 MG per tablet Take 1 tablet by mouth every 4 (four) hours as needed for pain. 12/31/12   Piedad Climes, PA-C  metFORMIN (GLUCOPHAGE) 500 MG tablet TAKE 2 TABLETS (1,000 MG TOTAL) BY MOUTH 2 (TWO) TIMES DAILY WITH A MEAL. 01/11/13   Sandford Craze, NP  multivitamin (ONE-A-DAY MEN'S) TABS Take 1 tablet by mouth daily.      Historical Provider, MD  olmesartan-hydrochlorothiazide (BENICAR HCT) 40-25 MG per tablet Take 1 tablet by mouth daily. 09/14/12   Sandford Craze, NP  ONE TOUCH LANCETS MISC by Does not apply route. Use to check blood sugar once a day     Historical Provider, MD  pantoprazole (PROTONIX) 40 MG tablet TAKE 1 TABLET (40 MG TOTAL) BY MOUTH DAILY. 11/30/12   Sandford Craze, NP  sildenafil (VIAGRA) 50 MG tablet Take 1 tablet (50 mg total) by mouth daily as needed for erectile dysfunction. 12/09/12   Sandford Craze, NP  valACYclovir (VALTREX) 500 MG tablet One tablet twice daily for 3 days. Start at first sign of breakout. 08/28/12   Sandford Craze, NP     Allergies:  No Known Allergies  Social History:   reports that he has never smoked. He  has never used smokeless tobacco. He reports that he drinks about 12.0 ounces of alcohol per week. He reports that he does not use illicit drugs.  Family History: Family History  Problem Relation Age of Onset  . Diabetes Mother   . Lung cancer Brother     twin brother  . Colon cancer Neg Hx   . Pancreatic cancer Paternal Aunt      Physical Exam: Filed Vitals:   02/10/13 2130 02/10/13 2200 02/10/13 2215 02/10/13 2233  BP: 76/46 88/49 84/46  78/46  Pulse:  84 84 66  Temp:      TempSrc:      Resp: 16     SpO2:  95% 99% 100%   Blood pressure 78/46, pulse 66, temperature 98.1 F (36.7 C), temperature source Oral, resp. rate 16, SpO2 100.00%.  GEN:   Lethargic patient lying in the stretcher in no acute distress; cooperative with exam when aroused. PSYCH: lethargic but oriented x4; does not appear anxious affect is appropriate. HEENT: Mucous membranes pink and anicteric; PERRLA; EOM intact; no cervical lymphadenopathy nor thyromegaly or carotid bruit; no JVD; There were no stridor. Neck is very supple. Breasts:: Not examined CHEST WALL: No tenderness CHEST: Normal respiration, clear to auscultation bilaterally.  HEART: Regular rate and rhythm.  There are no murmur, rub, or gallops.   BACK: No kyphosis or scoliosis; no CVA tenderness ABDOMEN: soft and non-tender; no masses, no organomegaly, normal abdominal bowel sounds; no pannus; no intertriginous candida. There is no rebound and no distention. Rectal Exam: Not done EXTREMITIES: No bone or joint deformity; age-appropriate arthropathy of the hands and knees; no edema; no ulcerations.  There is no calf tenderness. Genitalia: not examined PULSES: 2+ and symmetric SKIN: Normal hydration no rash or ulceration CNS: Cranial nerves 2-12 grossly intact no focal lateralizing neurologic deficit.  Speech is fluent; uvula elevated with phonation, facial symmetry and tongue midline. DTR are normal bilaterally, cerebella exam is intact, barbinski is negative and strengths are equaled bilaterally.  No sensory loss.   Labs on Admission:  Basic Metabolic Panel:  Recent Labs Lab 02/10/13 2100  NA 134*  K 3.6  CL 99  CO2 21  GLUCOSE 335*  BUN 6  CREATININE 0.96  CALCIUM 8.3*   Liver Function Tests: No results found for this basename: AST, ALT, ALKPHOS, BILITOT, PROT, ALBUMIN,  in the last 168 hours No results found for this basename: LIPASE, AMYLASE,  in the last 168 hours No results found for this basename: AMMONIA,  in the last 168 hours CBC:  Recent Labs Lab 02/10/13 2100  WBC 7.0  NEUTROABS 4.6  HGB 12.5*  HCT 35.9*  MCV 84.1  PLT 271   Cardiac Enzymes: No results found for this  basename: CKTOTAL, CKMB, CKMBINDEX, TROPONINI,  in the last 168 hours  CBG: No results found for this basename: GLUCAP,  in the last 168 hours   Radiological Exams on Admission: No results found.  Assessment/Plan Present on Admission:  . Hypotension arterial . DIABETES MELLITUS, TYPE II . Depression with anxiety . HYPERLIPIDEMIA  PLAN: Will admit him to the ICU for closer monitoring of RR, and BP.  Will check an ABG. For his hypotension, will cycle his troponins.   I have stopped his betablocker, benicar/Hct, and will continue IVF. Will order sitter with suicidal precaution.  Please consult psychiatry tomorrow. Though his BP is soft, he is having adequate urinary output, and no evidence of shock.  For his DM, will give IVF, and SSI  as needed. I have held his metformin.    Other plans as per orders.  Code Status: FULL Unk Lightning, MD. [facility] Pager [phone] 7pm to 7am.  02/10/2013, 11:29 PM

## 2013-02-10 NOTE — ED Notes (Signed)
Per EMS, pt from a friends house.  Wife has recently left him and pt very upset.  Pt called family telling them goodbye.  Pt states he took 20 (0.5mg ) xanax.  Family called EMS.  BP soft in route.  Pt given 250 cc NS.  BP 104 on arrival.  Pt remains alert. No hx of same.  CBG 416.  HX HTN/DM.  Vitals  104/78, hr 95, resp 18, 94% ra.  IV 20g L hand

## 2013-02-10 NOTE — ED Notes (Signed)
Bed: WA20 Expected date:  Expected time:  Means of arrival:  Comments: 37 y/o M, etoh, overdose 20 xanax

## 2013-02-10 NOTE — ED Notes (Signed)
Pt attempting to get out of bed.  Security and MD notified.  Pt not listening to reasoning.  Haldol and restraints on standby at this time.

## 2013-02-10 NOTE — ED Notes (Signed)
BP low.  MD at bedside.  2nd IV started and open.  Pt placed in trendelenburg position.  Pt remains lethargic.

## 2013-02-10 NOTE — ED Provider Notes (Signed)
CSN: [id number]     Arrival date & time 02/10/13  1928 History   First MD Initiated Contact with Patient 02/10/13 1935     Chief Complaint  Patient presents with  . Drug Overdose   (Consider location/radiation/quality/duration/timing/severity/associated sxs/prior Treatment) HPI  This is a 37 rolled male who presents with acute overdose. He has a history of pancreatitis, diabetes, and hypertension. Per EMS report, patient overdosed on approximately 20 0.5 mg Xanax tablets. This is in the setting of becoming separated from his wife. He reportedly called his family to say goodbye. His brother and sister found him unarousable on the couch. Upon EMS arrival, patient was somnolent but arousable. Given 250 cc bolus in route. CBG was 416. Patient confirms story is as told by EMS. He states "I just wanted to stop hurting." He denies any other ingestions. He denies any alcohol use.  He denies any chest pain or shortness breath, abdominal pain, urinary symptoms, focal weakness or numbness  Past Medical History  Diagnosis Date  . Alcoholic pancreatitis     recurrent  . Diabetes mellitus, type II     New Onset 03/2010  . Hypertension   . 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 High cholesterol   . Sleep apnea   . GERD (gastroesophageal reflux disease)   . Depression    Past Surgical History  Procedure Laterality Date  . Lumbar microdiscectomy  ~ 2004    Dr Danielle Dess   Family History  Problem Relation Age of Onset  . Diabetes Mother   . Lung cancer Brother     twin brother  . Colon cancer Neg Hx   . Pancreatic cancer Paternal Aunt    History  Substance Use Topics  . Smoking status: Never Smoker   . Smokeless tobacco: Never Used  . Alcohol Use: 12.0 oz/week    15 Cans of beer, 5 Shots of liquor per week     Comment: 08/04/2012 "maybe 15 beers/wk; I don't drink qd"   12/31/2012 "switched to mostly liquor, but trying to cut back"    Review of  Systems  Constitutional: Negative.  Negative for fever.  Respiratory: Negative.  Negative for chest tightness and shortness of breath.   Cardiovascular: Negative.  Negative for chest pain.  Gastrointestinal: Negative.  Negative for abdominal pain.  Genitourinary: Negative.  Negative for dysuria.  Neurological: Negative for headaches.  Psychiatric/Behavioral: Positive for suicidal ideas.  All other systems reviewed and are negative.    Allergies  Review of patient's allergies indicates no known allergies.  Home Medications   Current Outpatient Rx  Name  Route  Sig  Dispense  Refill  . ALPRAZolam (XANAX) 0.5 MG tablet      One tablet by mouth twice daily as needed.   90 tablet   0   . atorvastatin (LIPITOR) 10 MG tablet   Oral   Take 1 tablet (10 mg total) by mouth daily.   90 tablet   0   . bisoprolol (ZEBETA) 10 MG tablet   Oral   Take 1 tablet (10 mg total) by mouth daily.   90 tablet   1   . DULoxetine (CYMBALTA) 60 MG capsule   Oral   Take 60 mg by mouth daily.         Marland Kitchen glucose blood (ONE TOUCH TEST STRIPS) test strip      1 each. Use to check blood sugar once  day         . HYDROcodone-acetaminophen (NORCO/VICODIN) 5-325 MG per tablet   Oral   Take 1 tablet by mouth every 4 (four) hours as needed for pain.   10 tablet   0     Ok for patient to fill 12/31/12   . metFORMIN (GLUCOPHAGE) 500 MG tablet      TAKE 2 TABLETS (1,000 MG TOTAL) BY MOUTH 2 (TWO) TIMES DAILY WITH A MEAL.   120 tablet   3   . multivitamin (ONE-A-DAY MEN'S) TABS   Oral   Take 1 tablet by mouth daily.           Marland Kitchen olmesartan-hydrochlorothiazide (BENICAR HCT) 40-25 MG per tablet   Oral   Take 1 tablet by mouth daily.   90 tablet   1   . ONE TOUCH LANCETS MISC   Does not apply   by Does not apply route. Use to check blood sugar once a day          . pantoprazole (PROTONIX) 40 MG tablet      TAKE 1 TABLET (40 MG TOTAL) BY MOUTH DAILY.   30 tablet   3   . sildenafil  (VIAGRA) 50 MG tablet   Oral   Take 1 tablet (50 mg total) by mouth daily as needed for erectile dysfunction.   10 tablet   2   . valACYclovir (VALTREX) 500 MG tablet      One tablet twice daily for 3 days. Start at first sign of breakout.   12 tablet   5    BP 78/46  Pulse 66  Temp(Src) 98.1 F (36.7 C) (Oral)  Resp 16  SpO2 100% Physical Exam  Nursing note and vitals reviewed. Constitutional: He appears well-developed and well-nourished.  Somnolent but arousable  HENT:  Head: Normocephalic and atraumatic.  Eyes: Pupils are equal, round, and reactive to light.  Pupils 3 mm and reactive bilaterally  Neck: Neck supple.  Cardiovascular: Normal rate, regular rhythm and normal heart sounds.   No murmur heard. Pulmonary/Chest: Effort normal and breath sounds normal. No respiratory distress. He has no wheezes.  Respiratory rate 15  Abdominal: Soft. Bowel sounds are normal. There is no tenderness. There is no rebound.  Musculoskeletal: He exhibits no edema.  Lymphadenopathy:    He has no cervical adenopathy.  Neurological:  Somnolent but arousable, follows commands, no lateralizing signs  Skin: Skin is warm and dry.  Psychiatric:  Flat affect    ED Course  Procedures (including critical care time) Labs Review Labs Reviewed  URINALYSIS, ROUTINE W REFLEX MICROSCOPIC - Abnormal; Notable for the following:    Glucose, UA >1000 (*)    All other components within normal limits  BASIC METABOLIC PANEL - Abnormal; Notable for the following:    Sodium 134 (*)    Glucose, Bld 335 (*)    Calcium 8.3 (*)    All other components within normal limits  CBC WITH DIFFERENTIAL - Abnormal; Notable for the following:    Hemoglobin 12.5 (*)    HCT 35.9 (*)    All other components within normal limits  SALICYLATE LEVEL - Abnormal; Notable for the following:    Salicylate Lvl <2.0 (*)    All other components within normal limits  CG4 I-STAT (LACTIC ACID) - Abnormal; Notable for the  following:    Lactic Acid, Venous 4.34 (*)    All other components within normal limits  URINE RAPID DRUG SCREEN (HOSP PERFORMED)  ACETAMINOPHEN  LEVEL  URINE MICROSCOPIC-ADD ON  CBC WITH DIFFERENTIAL  BLOOD GAS, ARTERIAL   Imaging Review No results found.  EKG Interpretation   None      EKG independently reviewed by myself: Normal sinus rhythm with a rate of 89, no evidence of ST elevation or acute ischemia, normal intervals, no widening of the QRS  MDM   1. Overdose drug, initial encounter   2. Hypotension   3. Suicidal ideation    This a 37 year old male who presents with acute overdose.  He is somnolent but arousable on exam. Pupils are equal and reactive. Blood pressure initially noted to be 70/36. Patient was given 2 L of fluid. He is mentating. Basic labwork was obtained. Poison control advises supportive care with fluids and monitoring for at least 6 hours. EKG shows no evidence of heart block or interval prolongation. Work up is notable for hyperglycemia. Patient remained hypotensive even after 2 L of fluid. He does take a beta blocker for hypertension, but at this time I have low suspicion for beta blocker overdose given his heart rate.  UDS is negative. Tylenol level is negative. Patient was given a third liter of fluid. He continues to remain 80s/40s.  Lactate was obtained and shows a lactic acidosis at 4. Patient is not acidotic. Patient was discussed with the hospitalist who will admit the patient to the ICU.    Shon Baton, MD 02/10/13 [phone]

## 2013-02-11 ENCOUNTER — Encounter (HOSPITAL_COMMUNITY): Payer: Self-pay

## 2013-02-11 DIAGNOSIS — E119 Type 2 diabetes mellitus without complications: Secondary | ICD-10-CM

## 2013-02-11 DIAGNOSIS — F332 Major depressive disorder, recurrent severe without psychotic features: Secondary | ICD-10-CM

## 2013-02-11 DIAGNOSIS — F341 Dysthymic disorder: Secondary | ICD-10-CM

## 2013-02-11 DIAGNOSIS — F1010 Alcohol abuse, uncomplicated: Secondary | ICD-10-CM

## 2013-02-11 LAB — CBC
Hemoglobin: 11.8 g/dL — ABNORMAL LOW (ref 13.0–17.0)
MCH: 28.8 pg (ref 26.0–34.0)
RBC: 4.1 MIL/uL — ABNORMAL LOW (ref 4.22–5.81)
RDW: 13.1 % (ref 11.5–15.5)

## 2013-02-11 LAB — CREATININE, SERUM
Creatinine, Ser: 0.76 mg/dL (ref 0.50–1.35)
GFR calc non Af Amer: >90 mL/min (ref >90)

## 2013-02-11 LAB — GLUCOSE, CAPILLARY
Glucose-Capillary: 168 mg/dL — ABNORMAL HIGH (ref 70–99)
Glucose-Capillary: 118 mg/dL — ABNORMAL HIGH (ref 70–99)
Glucose-Capillary: 149 mg/dL — ABNORMAL HIGH (ref 70–99)

## 2013-02-11 LAB — TROPONIN I: Troponin I: <0.3 ng/mL (ref <0.30)

## 2013-02-11 LAB — TSH: TSH: 0.826 u[IU]/mL (ref 0.350–4.500)

## 2013-02-11 MED ORDER — SODIUM CHLORIDE 0.9 % IJ SOLN
3.0000 mL | Freq: Two times a day (BID) | INTRAMUSCULAR | Status: DC
  Administered 2013-02-11 – 2013-02-12 (×4): 3 mL via INTRAVENOUS

## 2013-02-11 MED ORDER — INSULIN ASPART 100 UNIT/ML ~~LOC~~ SOLN
0.0000 [IU] | SUBCUTANEOUS | Status: DC
Start: 2013-02-11 — End: 2013-02-12
  Administered 2013-02-11: 3 [IU] via SUBCUTANEOUS
  Administered 2013-02-11: 2 [IU] via SUBCUTANEOUS
  Administered 2013-02-11: 3 [IU] via SUBCUTANEOUS
  Administered 2013-02-11 – 2013-02-12 (×2): 2 [IU] via SUBCUTANEOUS
  Administered 2013-02-12: 3 [IU] via SUBCUTANEOUS
  Administered 2013-02-12: 11 [IU] via SUBCUTANEOUS
  Administered 2013-02-12: 2 [IU] via SUBCUTANEOUS

## 2013-02-11 MED ORDER — ALPRAZOLAM 0.25 MG PO TABS
0.2500 mg | ORAL_TABLET | Freq: Once | ORAL | Status: AC
  Administered 2013-02-11: 0.25 mg via ORAL
  Filled 2013-02-11: qty 1

## 2013-02-11 MED ORDER — ENOXAPARIN SODIUM 40 MG/0.4ML ~~LOC~~ SOLN
40.0000 mg | SUBCUTANEOUS | Status: DC
  Administered 2013-02-11 – 2013-02-12 (×2): 40 mg via SUBCUTANEOUS
  Filled 2013-02-11 (×2): qty 0.4

## 2013-02-11 MED ORDER — SODIUM CHLORIDE 0.9 % IV BOLUS (SEPSIS)
1000.0000 mL | Freq: Once | INTRAVENOUS | Status: AC
  Administered 2013-02-11: 1000 mL via INTRAVENOUS

## 2013-02-11 MED ORDER — DEXTROSE-NACL 5-0.9 % IV SOLN
INTRAVENOUS | Status: DC
  Administered 2013-02-11 (×3): via INTRAVENOUS

## 2013-02-11 MED ORDER — DULOXETINE HCL 60 MG PO CPEP
60.0000 mg | ORAL_CAPSULE | Freq: Every day | ORAL | Status: DC
  Administered 2013-02-12: 60 mg via ORAL
  Filled 2013-02-11 (×2): qty 1

## 2013-02-11 MED ORDER — PANTOPRAZOLE SODIUM 40 MG PO TBEC
40.0000 mg | DELAYED_RELEASE_TABLET | Freq: Every day | ORAL | Status: DC
  Administered 2013-02-12: 40 mg via ORAL
  Filled 2013-02-11 (×3): qty 1

## 2013-02-11 MED ORDER — ALPRAZOLAM 0.25 MG PO TABS: 0.2500 mg | ORAL_TABLET | Freq: Once | ORAL | Status: DC

## 2013-02-11 NOTE — Progress Notes (Signed)
eLink Physician-Brief Progress Note Patient Name: Willie [name] DOB: [birth year] MRN: [id number]  Date of Service  02/11/2013   HPI/Events of Note  Patient admitted with xanax overdose.  Had been alert but now intermittently responsive.  With stimulation the patient will wake and sats will increase but now with drops to the 60s.  Placed on ventimask by nurse.  ABG earlier was WNL.    eICU Interventions  Plan: BiPAP as bridge 100% NRB for now while awaiting BiPAP May need to intubate   Intervention Category Intermediate Interventions: Respiratory distress - evaluation and management  DETERDING,ELIZABETH 02/11/2013, 2:12 AM

## 2013-02-11 NOTE — Progress Notes (Signed)
CARE MANAGEMENT NOTE 02/11/2013  Patient:  REINER, LOEWEN   Account Number:  [id number]  Date Initiated:  02/11/2013  Documentation initiated by:  Ahamed Hofland  Subjective/Objective Assessment:   drug overdose requiring resp support with bipap     Action/Plan:   home when stable   Anticipated DC Date:  02/14/2013   Anticipated DC Plan:  HOME/SELF CARE  In-house referral  Clinical Social Worker      DC Planning Services  NA      [facility] Choice  NA   Choice offered to / List presented to:  NA   DME arranged  NA      DME agency  NA     HH arranged  NA      HH agency  NA   Status of service:  In process, will continue to follow Medicare Important Message given?  NA - LOS <3 / Initial given by admissions (If response is "NO", the following Medicare IM given date fields will be blank) Date Medicare IM given:   Date Additional Medicare IM given:    Discharge Disposition:    Per UR Regulation:  Reviewed for med. necessity/level of care/duration of stay  If discussed at Long Length of Stay Meetings, dates discussed:    Comments:  [id number]/Pinkney Venard Stark Jock, BSN, [place] [phone] Chart Reviewed for discharge and hospital needs. Discharge needs at time of review:  None Review of patient progress due on [id number].

## 2013-02-11 NOTE — Progress Notes (Signed)
Pt on 3 LPM J[place] and tolerating well at this time, RT to monitor and assess as needed.

## 2013-02-11 NOTE — Consult Note (Signed)
[facility] Face-to-Face Psychiatry Consult   Reason for Consult:  Overdose on Xanax Referring Physician:  Dr Willie [name] Willie [name] is an 37 y.o. male.  Assessment: AXIS I:  Major Depression, Recurrent severe AXIS II:  Deferred AXIS III:   Past Medical History  Diagnosis Date  . Alcoholic pancreatitis     recurrent  . Diabetes mellitus, type II     New Onset 03/2010  . Hypertension   . 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 High cholesterol   . Sleep apnea   . GERD (gastroesophageal reflux disease)   . Depression    AXIS IV:  economic problems, other psychosocial or environmental problems, problems related to social environment and problems with primary support group AXIS V:  1-10 persistent dangerousness to self and others present  Plan:  Recommend psychiatric Inpatient admission when medically cleared.  Subjective:   Willie [name] is a 37 y.o. male patient admitted with overdose on Xanax.Marland Kitchen  HPI:  Patient seen chart reviewed.  Patient is 37 year old man who was admitted after taking an overdose of Xanax 0.5 mg 30 tablets.  Patient admitted having depression for some time which has been getting worse in recent days.  He is in the process of getting divorced because his wife decided to live with another man.  Patient has 2 children who are 28 and 60-year-old.  Yesterday patient tried to see the children however his wife did not let him see the kids and took the phone away.  He was unable to talk to his children.  After that patient became more depressed and took unknown amount of Xanax.  Patient denied it was a suicidal attempt however when I spoke to her mother and sister they told the patient has been very depressed in past 2 weeks.  During the interview patient was very tearful, anxious and admitted to feeling of hopelessness and helplessness.  Patient also endorsed drinking alcohol last night because he was very upset and did not  know what to do.  He is taking Cymbalta and Xanax from [name] [place] but does not feel it is working.  Patient has any hallucination, paranoia, aggression or any mania.  He endorses feeling of hopelessness, anhedonia, lack of motivation and poor sleep.  He lost his job one year ago from [employer].  Patient told he was fired from the work.  Patient endorsed financial distress causing more intense to his depressive symptoms.  Patien is also seeing therapist at [name]. HPI Elements:   Location:  medical floor. Quality:  poor. Severity:  moderate.  Past Psychiatric History: Past Medical History  Diagnosis Date  . Alcoholic pancreatitis     recurrent  . Diabetes mellitus, type II     New Onset 03/2010  . Hypertension   . 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 High cholesterol   . Sleep apnea   . GERD (gastroesophageal reflux disease)   . Depression     reports that he has never smoked. He has never used smokeless tobacco. He reports that he drinks about 12.0 ounces of alcohol per week. He reports that he does not use illicit drugs. Family History  Problem Relation Age of Onset  . Diabetes Mother   . Lung cancer Brother     twin brother  . Colon cancer Neg  Hx   . Pancreatic cancer Paternal Aunt      Living Arrangements: Alone (WIFE RECENTLY LEFT HIM)   Abuse/Neglect [name]) Physical Abuse: Denies Verbal Abuse: Denies Sexual Abuse: Denies Allergies:  No Known Allergies  ACT Assessment Complete:  No:   Past Psychiatric History: Diagnosis:  Depression   Hospitalizations:  Denies   Outpatient Care:  [facility] for counseling   Substance Abuse Care:  [facility]oh  Self-Mutilation:  denies  Suicidal Attempts:  yes  Homicidal Behaviors:  denies   Violent Behaviors:  denies   Place of Residence:  Lives alone Marital Status:  Separated Employed/Unemployed:  Unemployed Education:  Some education Family Supports:  Mother, brother  and sister Objective: Blood pressure 130/89, pulse 65, temperature 97.8 F (36.6 C), temperature source Axillary, resp. rate 13, height 5\' 11"  (1.803 m), weight 244 lb 4.3 oz (110.8 kg), SpO2 100.00%.Body mass index is 34.08 kg/(m^2). Results for orders placed during the hospital encounter of 02/10/13 (from the past 72 hour(s))  URINALYSIS, ROUTINE W REFLEX MICROSCOPIC     Status: Abnormal   Collection Time    02/10/13  8:52 PM      Result Value Range   Color, Urine YELLOW  YELLOW   APPearance CLEAR  CLEAR   Specific Gravity, Urine 1.021  1.005 - 1.030   pH 5.5  5.0 - 8.0   Glucose, UA >1000 (*) NEGATIVE mg/dL   Hgb urine dipstick NEGATIVE  NEGATIVE   Bilirubin Urine NEGATIVE  NEGATIVE   Ketones, ur NEGATIVE  NEGATIVE mg/dL   Protein, ur NEGATIVE  NEGATIVE mg/dL   Urobilinogen, UA 0.2  0.0 - 1.0 mg/dL   Nitrite NEGATIVE  NEGATIVE   Leukocytes, UA NEGATIVE  NEGATIVE  URINE RAPID DRUG SCREEN (HOSP PERFORMED)     Status: None   Collection Time    02/10/13  8:52 PM      Result Value Range   Opiates NONE DETECTED  NONE DETECTED   Cocaine NONE DETECTED  NONE DETECTED   Benzodiazepines NONE DETECTED  NONE DETECTED   Amphetamines NONE DETECTED  NONE DETECTED   Tetrahydrocannabinol NONE DETECTED  NONE DETECTED   Barbiturates NONE DETECTED  NONE DETECTED   Comment:            DRUG SCREEN FOR MEDICAL PURPOSES     ONLY.  IF CONFIRMATION IS NEEDED     FOR ANY PURPOSE, NOTIFY LAB     WITHIN 5 DAYS.                LOWEST DETECTABLE LIMITS     FOR URINE DRUG SCREEN     Drug Class       Cutoff (ng/mL)     Amphetamine      1000     Barbiturate      200     Benzodiazepine   200     Tricyclics       300     Opiates          300     Cocaine          300     THC              50  URINE MICROSCOPIC-ADD ON     Status: None   Collection Time    02/10/13  8:52 PM      Result Value Range   Urine-Other       Value: NO FORMED ELEMENTS SEEN ON URINE MICROSCOPIC EXAMINATION  ACETAMINOPHEN LEVEL      Status: None   Collection Time    02/10/13  9:00 PM      Result Value Range   Acetaminophen (Tylenol), Serum <15.0  10 - 30 ug/mL   Comment:            THERAPEUTIC CONCENTRATIONS VARY     SIGNIFICANTLY. A RANGE OF 10-30     ug/mL MAY BE AN EFFECTIVE     CONCENTRATION FOR MANY PATIENTS.     HOWEVER, SOME ARE BEST TREATED     AT CONCENTRATIONS OUTSIDE THIS     RANGE.     ACETAMINOPHEN CONCENTRATIONS     >150 ug/mL AT 4 HOURS AFTER     INGESTION AND >50 ug/mL AT 12     HOURS AFTER INGESTION ARE     OFTEN ASSOCIATED WITH TOXIC     REACTIONS.  BASIC METABOLIC PANEL     Status: Abnormal   Collection Time    02/10/13  9:00 PM      Result Value Range   Sodium 134 (*) 135 - 145 mEq/L   Potassium 3.6  3.5 - 5.1 mEq/L   Chloride 99  96 - 112 mEq/L   CO2 21  19 - 32 mEq/L   Glucose, Bld 335 (*) 70 - 99 mg/dL   BUN 6  6 - 23 mg/dL   Creatinine, Ser 1.61  0.50 - 1.35 mg/dL   Calcium 8.3 (*) 8.4 - 10.5 mg/dL   GFR calc non Af Amer >90  >90 mL/min   GFR calc Af Amer >90  >90 mL/min   Comment: (NOTE)     The eGFR has been calculated using the CKD EPI equation.     This calculation has not been validated in all clinical situations.     eGFR's persistently <90 mL/min signify possible Chronic Kidney     Disease.  CBC WITH DIFFERENTIAL     Status: Abnormal   Collection Time    02/10/13  9:00 PM      Result Value Range   WBC 7.0  4.0 - 10.5 K/uL   RBC 4.27  4.22 - 5.81 MIL/uL   Hemoglobin 12.5 (*) 13.0 - 17.0 g/dL   Comment: RESULTS VERIFIED VIA RECOLLECT   HCT 35.9 (*) 39.0 - 52.0 %   MCV 84.1  78.0 - 100.0 fL   MCH 29.3  26.0 - 34.0 pg   MCHC 34.8  30.0 - 36.0 g/dL   RDW 09.6  04.5 - 40.9 %   Platelets 271  150 - 400 K/uL   Neutrophils Relative % 66  43 - 77 %   Neutro Abs 4.6  1.7 - 7.7 K/uL   Lymphocytes Relative 28  12 - 46 %   Lymphs Abs 2.0  0.7 - 4.0 K/uL   Monocytes Relative 5  3 - 12 %   Monocytes Absolute 0.4  0.1 - 1.0 K/uL   Eosinophils Relative 1  0 - 5 %    Eosinophils Absolute 0.1  0.0 - 0.7 K/uL   Basophils Relative 0  0 - 1 %   Basophils Absolute 0.0  0.0 - 0.1 K/uL  SALICYLATE LEVEL     Status: Abnormal   Collection Time    02/10/13  9:00 PM      Result Value Range   Salicylate Lvl <2.0 (*) 2.8 - 20.0 mg/dL  BLOOD GAS, ARTERIAL     Status: Abnormal   Collection Time    02/10/13 11:10  PM      Result Value Range   O2 Content 2.0     Delivery systems NASAL CANNULA     pH, Arterial 7.339 (*) 7.350 - 7.450   pCO2 arterial 40.1  35.0 - 45.0 mmHg   pO2, Arterial 100.0  80.0 - 100.0 mmHg   Bicarbonate 21.0  20.0 - 24.0 mEq/L   TCO2 19.1  0 - 100 mmol/L   Acid-base deficit 3.9 (*) 0.0 - 2.0 mmol/L   O2 Saturation 96.7     Patient temperature 98.6     Collection site RIGHT RADIAL     Drawn by [id number]     Sample type ARTERIAL DRAW     Allens test (pass/fail) PASS  PASS  CG4 I-STAT (LACTIC ACID)     Status: Abnormal   Collection Time    02/10/13 11:21 PM      Result Value Range   Lactic Acid, Venous 4.34 (*) 0.5 - 2.2 mmol/L  MRSA PCR SCREENING     Status: None   Collection Time    02/11/13 12:56 AM      Result Value Range   MRSA by PCR NEGATIVE  NEGATIVE   Comment:            The GeneXpert MRSA Assay (FDA     approved for NASAL specimens     only), is one component of a     comprehensive MRSA colonization     surveillance program. It is not     intended to diagnose MRSA     infection nor to guide or     monitor treatment for     MRSA infections.  GLUCOSE, CAPILLARY     Status: Abnormal   Collection Time    02/11/13  1:19 AM      Result Value Range   Glucose-Capillary 168 (*) 70 - 99 mg/dL  CBC     Status: Abnormal   Collection Time    02/11/13  2:44 AM      Result Value Range   WBC 7.2  4.0 - 10.5 K/uL   RBC 4.10 (*) 4.22 - 5.81 MIL/uL   Hemoglobin 11.8 (*) 13.0 - 17.0 g/dL   HCT 78.2 (*) 95.6 - 21.3 %   MCV 84.1  78.0 - 100.0 fL   MCH 28.8  26.0 - 34.0 pg   MCHC 34.2  30.0 - 36.0 g/dL   RDW 08.6  57.8 - 46.9 %    Platelets 231  150 - 400 K/uL  CREATININE, SERUM     Status: None   Collection Time    02/11/13  2:44 AM      Result Value Range   Creatinine, Ser 0.76  0.50 - 1.35 mg/dL   GFR calc non Af Amer >90  >90 mL/min   GFR calc Af Amer >90  >90 mL/min   Comment: (NOTE)     The eGFR has been calculated using the CKD EPI equation.     This calculation has not been validated in all clinical situations.     eGFR's persistently <90 mL/min signify possible Chronic Kidney     Disease.  TSH     Status: None   Collection Time    02/11/13  2:44 AM      Result Value Range   TSH 0.826  0.350 - 4.500 uIU/mL   Comment: Performed at [facility]  TROPONIN I     Status: None   Collection Time    02/11/13  2:44 AM      Result Value Range   Troponin I <0.30  <0.30 ng/mL   Comment:            Due to the release kinetics of cTnI,     a negative result within the first hours     of the onset of symptoms does not rule out     myocardial infarction with certainty.     If myocardial infarction is still suspected,     repeat the test at appropriate intervals.  TROPONIN I     Status: None   Collection Time    02/11/13  4:31 AM      Result Value Range   Troponin I <0.30  <0.30 ng/mL   Comment:            Due to the release kinetics of cTnI,     a negative result within the first hours     of the onset of symptoms does not rule out     myocardial infarction with certainty.     If myocardial infarction is still suspected,     repeat the test at appropriate intervals.  GLUCOSE, CAPILLARY     Status: Abnormal   Collection Time    02/11/13  5:39 AM      Result Value Range   Glucose-Capillary 118 (*) 70 - 99 mg/dL   Comment 1 Notify RN    GLUCOSE, CAPILLARY     Status: Abnormal   Collection Time    02/11/13  7:52 AM      Result Value Range   Glucose-Capillary 149 (*) 70 - 99 mg/dL  GLUCOSE, CAPILLARY     Status: Abnormal   Collection Time    02/11/13 11:41 AM      Result Value Range    Glucose-Capillary 128 (*) 70 - 99 mg/dL  TROPONIN I     Status: None   Collection Time    02/11/13 12:25 PM      Result Value Range   Troponin I <0.30  <0.30 ng/mL   Comment:            Due to the release kinetics of cTnI,     a negative result within the first hours     of the onset of symptoms does not rule out     myocardial infarction with certainty.     If myocardial infarction is still suspected,     repeat the test at appropriate intervals.   Labs are reviewed and are pertinent for UDS negative for benzodiazepine.  Current Facility-Administered Medications  Medication Dose Route Frequency Provider Last Rate Last Dose  . dextrose 5 %-0.9 % sodium chloride infusion   Intravenous Continuous Houston Siren, MD 125 mL/hr at 02/11/13 1045    . DULoxetine (CYMBALTA) DR capsule 60 mg  60 mg Oral Daily Houston Siren, MD      . enoxaparin (LOVENOX) injection 40 mg  40 mg Subcutaneous Q24H Houston Siren, MD   40 mg at 02/11/13 1007  . insulin aspart (novoLOG) injection 0-15 Units  0-15 Units Subcutaneous Q4H Houston Siren, MD   2 Units at 02/11/13 1146  . pantoprazole (PROTONIX) EC tablet 40 mg  40 mg Oral Daily Houston Siren, MD      . sodium chloride 0.9 % injection 3 mL  3 mL Intravenous Q12H Houston Siren, MD   3 mL at 02/11/13 1009    Psychiatric Specialty Exam:     Blood pressure 130/89, pulse  65, temperature 97.8 F (36.6 C), temperature source Axillary, resp. rate 13, height 5\' 11"  (1.803 m), weight 244 lb 4.3 oz (110.8 kg), SpO2 100.00%.Body mass index is 34.08 kg/(m^2).  General Appearance: Fairly Groomed and Guarded  Patent attorney::  Fair  Speech:  Slow  Volume:  Decreased  Mood:  Depressed, Dysphoric and Hopeless  Affect:  Depressed and Flat  Thought Process:  Loose  Orientation:  Full (Time, Place, and Person)  Thought Content:  Rumination  Suicidal Thoughts:  Yes.  with intent/plan  Homicidal Thoughts:  No  Memory:  Negative  Judgement:  Impaired  Insight:  Lacking  Psychomotor Activity:   Decreased  Concentration:  Poor  Recall:  Fair  Akathisia:  No  Handed:  Right  AIMS (if indicated):     Assets:  Communication Skills Housing Social Support  Sleep:      Treatment Plan Summary: Medication management Patient requires inpatient psychiatric treatment when he is medically stable.  Continue sitter for patient safety.  Social worker please call [phone] to facilitate inpatient psychiatric treatment.  Please call us back if you have any further questions.  ARFEEN,SYED T. 02/11/2013 4:57 PM

## 2013-02-11 NOTE — Progress Notes (Signed)
Clinical Social Work Department CLINICAL SOCIAL WORK PSYCHIATRY SERVICE LINE ASSESSMENT 02/11/2013  Patient:  Willie [name]  Account:  [id number]  Admit Date:  02/10/2013  Clinical Social Worker:  Unk Lightning, LCSW  Date/Time:  02/11/2013 04:15 PM Referred by:  Physician  Date referred:  02/11/2013 Reason for Referral  Psychosocial assessment   Presenting Symptoms/Problems (In the person's/family's own words):   Psych consulted due to OD.   Abuse/Neglect/Trauma History (check all that apply)  Denies history   Abuse/Neglect/Trauma Comments:   N/A   Psychiatric History (check all that apply)  Outpatient treatment   Psychiatric medications:  Cymbalta 60 mg   Current Mental Health Hospitalizations/Previous Mental Health History:   Patient sleeping and did not participate in assessment. Family reports that patient has been diagnosed with depression and has several stressors at this time. Patient receives medication management but family unsure at which facility.   Current provider:   Unknown at this time   Place and Date:   Unknown at this time   Current Medications:   Scheduled Meds:      . DULoxetine  60 mg Oral Daily  . enoxaparin (LOVENOX) injection  40 mg Subcutaneous Q24H  . insulin aspart  0-15 Units Subcutaneous Q4H  . pantoprazole  40 mg Oral Daily  . sodium chloride  3 mL Intravenous Q12H        Continuous Infusions:      . dextrose 5 % and 0.9% NaCl 125 mL/hr at 02/11/13 1045          PRN Meds:.       Previous Impatient Admission/Date/Reason:   Family is unaware of any hospitalizations. CSW will verify with patient.   Emotional Health / Current Symptoms    Suicide/Self Harm  Suicide attempt in past (date/description)   Suicide attempt in the past:   Patient admitted after overdosing on medications.   Other harmful behavior:   None reported   Psychotic/Dissociative Symptoms  Unable to accurately assess   Other Psychotic/Dissociative Symptoms:    Will evaluate when patient able to participate in assessment.    Attention/Behavioral Symptoms  Unable to accurately assess   Other Attention / Behavioral Symptoms:   Will evaluate when patient able to participate in assessment.    Cognitive Impairment  Unable to accurately assess   Other Cognitive Impairment:   Will evaluate when patient able to participate in assessment.    Mood and Adjustment  Unable to accurately assess    Stress, Anxiety, Trauma, Any Recent Loss/Stressor  Relationship  Other - See comment   Anxiety (frequency):   N/A   Phobia (specify):   N/A   Compulsive behavior (specify):   N/A   Obsessive behavior (specify):   N/A   Other:   Family reports patient is going through a custody battle to be able to have visitation rights with children.    SBIRT completed (please refer for detailed history):  N  Self-reported substance use:   Will assess with patient at later time.   Urinary Drug Screen Completed:  N Alcohol level:   N/A    Environmental/Housing/Living Arrangement  Stable housing   Who is in the home:   Alone   Emergency contact:  Art gallery manager   Patient's Strengths and Goals (patient's own words):   Patient has supportive sister and mother.   Clinical Social Worker's Interpretive Summary:   CSW received referral to complete psychosocial assessment. CSW reviewed chart and met with patient's  sister and mom at bedside. Patient sleeping and unable to be aroused by verbal stimuli. CSW introduced myself and explained role to family.    Sister and mom report that patient is currently living alone due to separating from wife. Patient has a son and dtr aged 9 and 60 who he is going through a custody battle in order to have visitation rights. Family reports that patient has been depressed and has been taking medications. Mom reports concern with patient's compliance with medication and reports that he  has stated that he has forgotten to take medication or chosen not to.    Family reports concern with patient and his separation from wife. Family reports that patient is unable to cope well with stress from separation and they are worried about his safety.    CSW will continue to follow and will assist with any recommendations provided by psych MD.   Disposition:  Recommend Psych CSW continuing to support while in hospital  [place], [place] [phone]

## 2013-02-11 NOTE — Progress Notes (Signed)
[facility] PROGRESS NOTE  CHEZ BULNES ZOX:[id number] DOB: July 16, 1975 DOA: 02/10/2013 PCP: Lemont Fillers., NP  Assessment/Plan: 1. Suicide attempt/opiate overdose/depression 1. Psychiatry consulted 2. Pt remains on additional O2 support 3. Cont close monitoring 4. Cont SSRI 2. DM 1. On SSI coverage 3. DVT prophylaxis 1. Lovenox  Code Status: Full Family Communication: Pt in room (indicate person spoken with, relationship, and if by phone, the number) Disposition Plan: Pending   Consultants:  Psychiatry  HPI/Subjective: No acute events noted overnight  Objective: Filed Vitals:   02/11/13 0600 02/11/13 0630 02/11/13 0744 02/11/13 0800  BP: 84/58 92/60  103/72  Pulse: 88 87 84 82  Temp:      TempSrc:      Resp: 25 28 14 23   Height:      Weight:      SpO2: 99% 100%  100%    Intake/Output Summary (Last 24 hours) at 02/11/13 0815 Last data filed at 02/11/13 0800  Gross per 24 hour  Intake 814.58 ml  Output    275 ml  Net 539.58 ml   Filed Weights   02/11/13 0102  Weight: 110.8 kg (244 lb 4.3 oz)    Exam:   General:  Arousable, in nad  Cardiovascular: regular, s1, s2  Respiratory: normal resp effort, no wheezing  Abdomen: soft, nondistended  Musculoskeletal: perfused, no clubbing   Data Reviewed: Basic Metabolic Panel:  Recent Labs Lab 02/10/13 2100 02/11/13 0244  NA 134*  --   K 3.6  --   CL 99  --   CO2 21  --   GLUCOSE 335*  --   BUN 6  --   CREATININE 0.96 0.76  CALCIUM 8.3*  --    Liver Function Tests: No results found for this basename: AST, ALT, ALKPHOS, BILITOT, PROT, ALBUMIN,  in the last 168 hours No results found for this basename: LIPASE, AMYLASE,  in the last 168 hours No results found for this basename: AMMONIA,  in the last 168 hours CBC:  Recent Labs Lab 02/10/13 2100 02/11/13 0244  WBC 7.0 7.2  NEUTROABS 4.6  --   HGB 12.5* 11.8*  HCT 35.9* 34.5*  MCV 84.1 84.1  PLT 271 231   Cardiac  Enzymes:  Recent Labs Lab 02/11/13 0244 02/11/13 0431  TROPONINI <0.30 <0.30   BNP (last 3 results) No results found for this basename: PROBNP,  in the last 8760 hours CBG:  Recent Labs Lab 02/11/13 0119  GLUCAP 168*    Recent Results (from the past 240 hour(s))  MRSA PCR SCREENING     Status: None   Collection Time    02/11/13 12:56 AM      Result Value Range Status   MRSA by PCR NEGATIVE  NEGATIVE Final   Comment:            The GeneXpert MRSA Assay (FDA     approved for NASAL specimens     only), is one component of a     comprehensive MRSA colonization     surveillance program. It is not     intended to diagnose MRSA     infection nor to guide or     monitor treatment for     MRSA infections.     Studies: No results found.  Scheduled Meds: . DULoxetine  60 mg Oral Daily  . enoxaparin (LOVENOX) injection  40 mg Subcutaneous Q24H  . insulin aspart  0-15 Units Subcutaneous Q4H  . pantoprazole  40 mg Oral  Daily  . sodium chloride  3 mL Intravenous Q12H   Continuous Infusions: . dextrose 5 % and 0.9% NaCl 125 mL/hr at 02/11/13 0129    Principal Problem:   Overdose of benzodiazepine Active Problems:   DIABETES MELLITUS, TYPE II   HYPERLIPIDEMIA   Depression with anxiety   Suicide attempt by substance overdose   Hypotension arterial   H/O: HTN (hypertension)    Time spent:    Kalin Amrhein K  [facility] Pager [phone]. If 7PM-7AM, please contact night-coverage at [email or web address], password [facility] 02/11/2013, 8:15 AM  LOS: 1 day

## 2013-02-12 ENCOUNTER — Encounter (HOSPITAL_COMMUNITY): Payer: Self-pay | Admitting: *Deleted

## 2013-02-12 ENCOUNTER — Inpatient Hospital Stay (HOSPITAL_COMMUNITY)
Admission: AD | Admit: 2013-02-12 | Discharge: 2013-02-15 | DRG: 885 | Disposition: A | Discharge status: Home / Self Care | Payer: 59 | Source: Intra-hospital | Attending: Psychiatry | Admitting: Psychiatry

## 2013-02-12 DIAGNOSIS — E119 Type 2 diabetes mellitus without complications: Secondary | ICD-10-CM | POA: Diagnosis present

## 2013-02-12 DIAGNOSIS — F332 Major depressive disorder, recurrent severe without psychotic features: Principal | ICD-10-CM | POA: Diagnosis present

## 2013-02-12 DIAGNOSIS — K219 Gastro-esophageal reflux disease without esophagitis: Secondary | ICD-10-CM | POA: Diagnosis present

## 2013-02-12 DIAGNOSIS — F1010 Alcohol abuse, uncomplicated: Secondary | ICD-10-CM | POA: Diagnosis present

## 2013-02-12 DIAGNOSIS — F411 Generalized anxiety disorder: Secondary | ICD-10-CM | POA: Diagnosis present

## 2013-02-12 DIAGNOSIS — F1020 Alcohol dependence, uncomplicated: Secondary | ICD-10-CM | POA: Diagnosis present

## 2013-02-12 DIAGNOSIS — F418 Other specified anxiety disorders: Secondary | ICD-10-CM | POA: Diagnosis present

## 2013-02-12 DIAGNOSIS — Z79899 Other long term (current) drug therapy: Secondary | ICD-10-CM

## 2013-02-12 DIAGNOSIS — I10 Essential (primary) hypertension: Secondary | ICD-10-CM | POA: Diagnosis present

## 2013-02-12 DIAGNOSIS — F329 Major depressive disorder, single episode, unspecified: Secondary | ICD-10-CM

## 2013-02-12 LAB — GLUCOSE, CAPILLARY
Glucose-Capillary: 123 mg/dL — ABNORMAL HIGH (ref 70–99)
Glucose-Capillary: 110 mg/dL — ABNORMAL HIGH (ref 70–99)
Glucose-Capillary: 138 mg/dL — ABNORMAL HIGH (ref 70–99)
Glucose-Capillary: 167 mg/dL — ABNORMAL HIGH (ref 70–99)
Glucose-Capillary: 206 mg/dL — ABNORMAL HIGH (ref 70–99)

## 2013-02-12 MED ORDER — HYDRALAZINE HCL 20 MG/ML IJ SOLN: 10.0000 mg | INTRAMUSCULAR | Status: DC

## 2013-02-12 MED ORDER — HYDROCHLOROTHIAZIDE 25 MG PO TABS
25.0000 mg | ORAL_TABLET | Freq: Every day | ORAL | Status: DC
  Administered 2013-02-13 – 2013-02-15 (×3): 25 mg via ORAL
  Filled 2013-02-12 (×5): qty 1

## 2013-02-12 MED ORDER — MAGNESIUM HYDROXIDE 400 MG/5ML PO SUSP: 30.0000 mL | Freq: Every day | ORAL | Status: DC | PRN

## 2013-02-12 MED ORDER — ACETAMINOPHEN 325 MG PO TABS
650.0000 mg | ORAL_TABLET | Freq: Four times a day (QID) | ORAL | Status: DC | PRN
Start: 2013-02-12 — End: 2013-02-15
  Administered 2013-02-14: 650 mg via ORAL
  Filled 2013-02-12: qty 2

## 2013-02-12 MED ORDER — OLMESARTAN MEDOXOMIL-HCTZ 40-25 MG PO TABS: 1.0000 | ORAL_TABLET | Freq: Every day | ORAL | Status: DC

## 2013-02-12 MED ORDER — METFORMIN HCL 500 MG PO TABS
1000.0000 mg | ORAL_TABLET | Freq: Two times a day (BID) | ORAL | Status: DC
  Administered 2013-02-13 – 2013-02-15 (×5): 1000 mg via ORAL
  Filled 2013-02-12 (×9): qty 2

## 2013-02-12 MED ORDER — HYDRALAZINE HCL 20 MG/ML IJ SOLN
10.0000 mg | INTRAMUSCULAR | Status: AC
  Administered 2013-02-12: 10 mg via INTRAVENOUS
  Filled 2013-02-12: qty 1

## 2013-02-12 MED ORDER — IRBESARTAN 300 MG PO TABS
300.0000 mg | ORAL_TABLET | Freq: Every day | ORAL | Status: DC
  Administered 2013-02-13 – 2013-02-15 (×3): 300 mg via ORAL
  Filled 2013-02-12 (×5): qty 1

## 2013-02-12 MED ORDER — HYDROCHLOROTHIAZIDE 25 MG PO TABS
25.0000 mg | ORAL_TABLET | Freq: Every day | ORAL | Status: DC
  Administered 2013-02-12: 25 mg via ORAL
  Filled 2013-02-12: qty 1

## 2013-02-12 MED ORDER — DULOXETINE HCL 60 MG PO CPEP
60.0000 mg | ORAL_CAPSULE | Freq: Every day | ORAL | Status: DC
  Administered 2013-02-13 – 2013-02-14 (×2): 60 mg via ORAL
  Filled 2013-02-12 (×5): qty 1

## 2013-02-12 MED ORDER — HYDROXYZINE HCL 25 MG PO TABS
25.0000 mg | ORAL_TABLET | ORAL | Status: DC | PRN
  Administered 2013-02-14: 25 mg via ORAL
  Filled 2013-02-12 (×2): qty 1

## 2013-02-12 MED ORDER — ALPRAZOLAM 0.25 MG PO TABS
0.2500 mg | ORAL_TABLET | Freq: Once | ORAL | Status: AC
  Administered 2013-02-12: 0.25 mg via ORAL
  Filled 2013-02-12: qty 1

## 2013-02-12 MED ORDER — ATORVASTATIN CALCIUM 10 MG PO TABS
10.0000 mg | ORAL_TABLET | Freq: Every day | ORAL | Status: DC
  Administered 2013-02-12: 10 mg via ORAL
  Filled 2013-02-12: qty 1

## 2013-02-12 MED ORDER — ATORVASTATIN CALCIUM 10 MG PO TABS
10.0000 mg | ORAL_TABLET | Freq: Every day | ORAL | Status: DC
Start: 2013-02-13 — End: 2013-02-15
  Administered 2013-02-13 – 2013-02-14 (×2): 10 mg via ORAL
  Filled 2013-02-12 (×5): qty 1

## 2013-02-12 MED ORDER — INSULIN ASPART 100 UNIT/ML ~~LOC~~ SOLN: 0.0000 [IU] | Freq: Once | SUBCUTANEOUS | Status: DC

## 2013-02-12 MED ORDER — BISOPROLOL FUMARATE 10 MG PO TABS
10.0000 mg | ORAL_TABLET | Freq: Every day | ORAL | Status: DC
  Administered 2013-02-12: 10 mg via ORAL
  Filled 2013-02-12: qty 1

## 2013-02-12 MED ORDER — BISOPROLOL FUMARATE 10 MG PO TABS
10.0000 mg | ORAL_TABLET | Freq: Every day | ORAL | Status: DC
  Administered 2013-02-13 – 2013-02-15 (×3): 10 mg via ORAL
  Filled 2013-02-12 (×5): qty 1

## 2013-02-12 MED ORDER — TRAZODONE HCL 50 MG PO TABS
50.0000 mg | ORAL_TABLET | Freq: Every evening | ORAL | Status: DC | PRN
  Administered 2013-02-12: 50 mg via ORAL
  Filled 2013-02-12: qty 1

## 2013-02-12 MED ORDER — VALACYCLOVIR HCL 500 MG PO TABS
500.0000 mg | ORAL_TABLET | Freq: Every day | ORAL | Status: DC
  Administered 2013-02-14 – 2013-02-15 (×2): 500 mg via ORAL
  Filled 2013-02-12 (×3): qty 1

## 2013-02-12 MED ORDER — PANTOPRAZOLE SODIUM 40 MG PO TBEC
40.0000 mg | DELAYED_RELEASE_TABLET | Freq: Every day | ORAL | Status: DC
  Administered 2013-02-13 – 2013-02-14 (×2): 40 mg via ORAL
  Filled 2013-02-12 (×5): qty 1

## 2013-02-12 MED ORDER — ALUM & MAG HYDROXIDE-SIMETH 200-200-20 MG/5ML PO SUSP: 30.0000 mL | ORAL | Status: DC | PRN

## 2013-02-12 MED ORDER — IRBESARTAN 300 MG PO TABS
300.0000 mg | ORAL_TABLET | Freq: Every day | ORAL | Status: DC
  Administered 2013-02-12: 300 mg via ORAL
  Filled 2013-02-12: qty 1

## 2013-02-12 NOTE — Progress Notes (Signed)
Clinical Social Work  CSW met with patient at bedside with mom present.  Patient reports he and wife have been married for 17 years and are now going through a divorce. On day of admission, wife was texting him stating that she is dating another man and he is moving in to replace patient's place. CSW validated patient's feelings and reminded him that he will always be his children's father. Patient concerned about visitation rights and states that wife will not let him have contact with children. CSW provided patient with legal aid information and encouraged him to talk with agency regarding assistance.  Patient reports he has been unemployed and it has been difficult to manage money and attend MH appointments. Patient saw therapist at [facility] but could no longer afford the $40 copay and stopped going. Patient also reports that he has not seen his psychiatrist in over a month due to financial concerns. Patient is interested in therapy and wants to return home as soon as possible due to just receiving a job. Patient wanted to speak with psych MD again in order to determine if he could DC home. CSW spoke with Dr. Lolly Mustache who reports that patient still requires inpatient treatment. Patient asked if he could leave against MD advice and CSW explained that IVC would be put into place. Patient understanding and agreeable to go to [facility] voluntarily.  Patient requested letter for work stating hospitalization.  Patient remains tearful throughout assessment and agreeable to [facility] when bed available.  [place], [place] [phone]

## 2013-02-12 NOTE — Progress Notes (Signed)
Report called and given to [place], rn at [facility]. All questions answered. bp within normal limits. Ofilia Neas

## 2013-02-12 NOTE — Progress Notes (Signed)
Clinical Social Work  Per chart review, patient medically stable to DC. CSW sent referral to [facility] and spoke with admissions to confirm referral was received. CSW will continue to follow.  [place], [place] [phone]

## 2013-02-12 NOTE — [facility] Group Notes (Signed)
Adult Psychoeducational Group Note  Date:  02/12/2013 Time:  10:09 PM  Group Topic/Focus:  Wrap-Up Group:   The focus of this group is to help patients review their daily goal of treatment and discuss progress on daily workbooks.  Participation Level:  Did Not Attend  Participation Quality:  None  Affect:  None  Cognitive:  None  Insight: None  Engagement in Group:  None  Modes of Intervention:  Discussion  Additional Comments:  Jene did not attend group.  Caroll Rancher A 02/12/2013, 10:09 PM

## 2013-02-12 NOTE — Progress Notes (Signed)
Clinical Social Work  CSW spoke with Inetta Fermo at [facility] who reports patient's high blood pressure needs to be resolved prior to admission. CSW spoke with RN and MD and MD reports he will address blood pressure medications. RN to retake vitals at 6pm and will call [phone] with report. Patient has already signed voluntary form. RN to call [facility] when [facility] has accepted to transport patient at [phone].  CSW updated patient and mom on DC plans.  [place], [place] [phone]

## 2013-02-12 NOTE — Discharge Summary (Signed)
Physician Discharge Summary  Willie [name]:[id number] DOB: April 19, 1976 DOA: 02/10/2013  PCP: Lemont Fillers., NP  Admit date: 02/10/2013 Discharge date: 02/12/2013  Time spent: 35 minutes  Recommendations for Outpatient Follow-up:  1. Follow up with PCP in 2 weeks after hospital discharge 2. Transfer to inpatient psychiatry for further treatment  Discharge Diagnoses:  Principal Problem:   Overdose of benzodiazepine Active Problems:   DIABETES MELLITUS, TYPE II   HYPERLIPIDEMIA   Depression with anxiety   Suicide attempt by substance overdose   Hypotension arterial   H/O: HTN (hypertension)   Discharge Condition: Improved  Diet recommendation: Diabetic  Filed Weights   02/11/13 0102 02/12/13 0415  Weight: 110.8 kg (244 lb 4.3 oz) 111.4 kg (245 lb 9.5 oz)    History of present illness:  Willie [name] is an 37 y.o. male with hx of DM2, depression and anxiety, sleep apnea, hyperlipidemia, reportedly overdose on Xanax 0.5mg , all 30 tablets. He has marital problems at home and reportedly problem with child custody. He also has hx of HTN and has been on Benacar/ Hct. He hadn't taken any other medicine or drugs. In the ER, he was found somnolent, but arousable. Evaluation showed hypotension with SBP around 90, not tachycardic. His UDS was negative, with normal tylenol and ASA level. He has normal renal fx, electrolytes, and BS of 400's. Poison control advised supportive care. He has been able to maintain his oral airways and breathing adequately.  Hospital Course:  Suicide attempt/opiate overdose/depression  1. Psychiatry was consulted - appreciate recs 2. Pt was initially lethargic and requiring BiPAP support, but was eventually weaned to RA and doing well 3. Cont SSRI 4. Recs noted for inpatient psychiatry 5. Pt was medically stable as of 10/17 for inpatient psych treatment DM  1. On SSI coverage DVT prophylaxis   1. Lovenox  Consultations:  Psychiatry  Discharge Exam: Filed Vitals:   02/12/13 1000 02/12/13 1100 02/12/13 1200 02/12/13 1342  BP:   153/101 135/88  Pulse: 73 73 116 81  Temp:   98.2 F (36.8 C) 98.3 F (36.8 C)  TempSrc:   Oral Oral  Resp: 14 14 14 16   Height:      Weight:      SpO2: 98% 74% 98% 97%    General: Awake, in nad Cardiovascular: regular, s1, s2 Respiratory: normal resp effort, no wheezing  Discharge Instructions    Medication List    STOP taking these medications       sildenafil 50 MG tablet  Commonly known as:  VIAGRA      TAKE these medications       ALPRAZolam 0.5 MG tablet  Commonly known as:  XANAX  One tablet by mouth twice daily as needed.     atorvastatin 10 MG tablet  Commonly known as:  LIPITOR  Take 1 tablet (10 mg total) by mouth daily.     bisoprolol 10 MG tablet  Commonly known as:  ZEBETA  Take 1 tablet (10 mg total) by mouth daily.     DULoxetine 60 MG capsule  Commonly known as:  CYMBALTA  Take 60 mg by mouth daily.     HYDROcodone-acetaminophen 5-325 MG per tablet  Commonly known as:  NORCO/VICODIN  Take 1 tablet by mouth every 4 (four) hours as needed for pain.     metFORMIN 500 MG tablet  Commonly known as:  GLUCOPHAGE  TAKE 2 TABLETS (1,000 MG TOTAL) BY MOUTH 2 (TWO) TIMES DAILY WITH A MEAL.  multivitamin Tabs tablet  Take 1 tablet by mouth daily.     olmesartan-hydrochlorothiazide 40-25 MG per tablet  Commonly known as:  BENICAR HCT  Take 1 tablet by mouth daily.     pantoprazole 40 MG tablet  Commonly known as:  PROTONIX  TAKE 1 TABLET (40 MG TOTAL) BY MOUTH DAILY.     valACYclovir 500 MG tablet  Commonly known as:  VALTREX  One tablet twice daily for 3 days. Start at first sign of breakout.       No Known Allergies Follow-up Information   Follow up with Lemont Fillers., NP. Schedule an appointment as soon as possible for a visit in 2 weeks.   Specialty:  Internal Medicine   Contact  information:   [address] [place] [address] [phone]        The results of significant diagnostics from this hospitalization (including imaging, microbiology, ancillary and laboratory) are listed below for reference.    Significant Diagnostic Studies: No results found.  Microbiology: Recent Results (from the past 240 hour(s))  MRSA PCR SCREENING     Status: None   Collection Time    02/11/13 12:56 AM      Result Value Range Status   MRSA by PCR NEGATIVE  NEGATIVE Final   Comment:            The GeneXpert MRSA Assay (FDA     approved for NASAL specimens     only), is one component of a     comprehensive MRSA colonization     surveillance program. It is not     intended to diagnose MRSA     infection nor to guide or     monitor treatment for     MRSA infections.     Labs: Basic Metabolic Panel:  Recent Labs Lab 02/10/13 2100 02/11/13 0244  NA 134*  --   K 3.6  --   CL 99  --   CO2 21  --   GLUCOSE 335*  --   BUN 6  --   CREATININE 0.96 0.76  CALCIUM 8.3*  --    Liver Function Tests: No results found for this basename: AST, ALT, ALKPHOS, BILITOT, PROT, ALBUMIN,  in the last 168 hours No results found for this basename: LIPASE, AMYLASE,  in the last 168 hours No results found for this basename: AMMONIA,  in the last 168 hours CBC:  Recent Labs Lab 02/10/13 2100 02/11/13 0244  WBC 7.0 7.2  NEUTROABS 4.6  --   HGB 12.5* 11.8*  HCT 35.9* 34.5*  MCV 84.1 84.1  PLT 271 231   Cardiac Enzymes:  Recent Labs Lab 02/11/13 0244 02/11/13 0431 02/11/13 1225  TROPONINI <0.30 <0.30 <0.30   BNP: BNP (last 3 results) No results found for this basename: PROBNP,  in the last 8760 hours CBG:  Recent Labs Lab 02/12/13 0010 02/12/13 0418 02/12/13 0525 02/12/13 0745 02/12/13 1215  GLUCAP 303* 123* 110* 138* 146*    Signed:  Olivya Sobol K  [facility] 02/12/2013, 2:50 PM

## 2013-02-12 NOTE — Progress Notes (Signed)
Clinical Social Work  CSW received call from [facility] reporting that they could accept patient at 1600 if vital signs within normal levels. CSW asked tech to retake vitals at 1600. CSW asked patient to sign voluntary form which was faxed to [facility] and placed on chart. CSW text paged MD. CSW will continue to follow.  [place], [place] [phone]

## 2013-02-12 NOTE — Progress Notes (Addendum)
Pt appears very depressed. He asked to have a bible and also asked for dinner. Pt was given both. Spoke with the PA, Leonette Most who is on call.He will put orders in on the pt. Pt is cooperative and stated his wife will only let him see the children on her terms. Pt stated he did phone home and the children did not recognize the phone number and did not pick up,.Pts. fsbs was 201 so he did not receive any insulin.

## 2013-02-12 NOTE — Tx Team (Signed)
Initial Interdisciplinary Treatment Plan  PATIENT STRENGTHS: (choose at least two) Ability for insight Average or above average intelligence Capable of independent living  PATIENT STRESSORS: Marital or family conflict   PROBLEM LIST: Problem List/Patient Goals Date to be addressed Date deferred Reason deferred Estimated date of resolution  Depression 02/12/13                                                      DISCHARGE CRITERIA:  Ability to meet basic life and health needs Improved stabilization in mood, thinking, and/or behavior Verbal commitment to aftercare and medication compliance  PRELIMINARY DISCHARGE PLAN: Attend aftercare/continuing care group Return to previous living arrangement  PATIENT/FAMIILY INVOLVEMENT: This treatment plan has been presented to and reviewed with the patient, Willie [name], and/or family member, .  The patient and family have been given the opportunity to ask questions and make suggestions.  Lovenia Debruler, [place] 02/12/2013, 8:17 PM

## 2013-02-12 NOTE — Progress Notes (Signed)
Clinical Social Work  CSW spoke with [place] in admissions at [facility] who reports Dr. Lolly Mustache accepted patient Willie [name] pending an available bed on [address]. [facility] is expecting DC and will call CSW when bed is available. CSW will continue to follow.  [place], [place] [phone]

## 2013-02-12 NOTE — Progress Notes (Signed)
37 year old male pt admitted on voluntary basis. Pt reports having some marital problems recently. Pt spoke about how he has been living in an apartment and his wife and children live in the home and how his wife was getting ready to move another man into the home and pt stated that she has only known this person for approximately one month. Pt did state that he took 3 xanax, was talking to his children on the phone and said his wife hung the phone up and that he proceeded to take another 3 xanax. Pt was upset, called his mother and brother who came over and then 911 was called and pt ended up in hospital. Pt denied any SI on admission and stated that he has no desire to want to die and able to contract for safety on the unit. Pt was oriented to the unit and safety maintained.

## 2013-02-12 NOTE — Progress Notes (Signed)
Patient discharged to [facility]. Discharge instructions given with family at bedside. No concerns voiced. Pt excited about being transferred to [facility]. Stated, " I'm ready to go and get this thing over with". Left unit ambulating to checkout accompanied by family members. Refused to be wheeled downstairs. No concerns voiced. [facility] in to transport patient to [facility]. Sitter accompanied patient to [facility]. [facility] will transport nurse tech back to this hospital. Ofilia Neas.

## 2013-02-12 NOTE — Progress Notes (Signed)
[facility] PROGRESS NOTE  LOT MEDFORD AOZ:[id number] DOB: 07/01/1975 DOA: 02/10/2013 PCP: Lemont Fillers., NP  Assessment/Plan: 1. Suicide attempt/opiate overdose/depression 1. Psychiatry consulted - appreciate recs 2. Pt now on RA and doing well 3. Cont close monitoring 4. Cont SSRI 5. Recs for inpatient psychiatry 6. Pt is medically stable at this time for inpatient psych treatment 2. DM 1. On SSI coverage 3. DVT prophylaxis 1. Lovenox  Code Status: Full Family Communication: Pt in room (indicate person spoken with, relationship, and if by phone, the number) Disposition Plan: Pending   Consultants:  Psychiatry  HPI/Subjective: No acute events noted overnight. Successfully weaned to RA  Objective: Filed Vitals:   02/12/13 0200 02/12/13 0400 02/12/13 0415 02/12/13 0600  BP: 137/92 129/77  121/75  Pulse: 74 74  78  Temp:   98.1 F (36.7 C)   TempSrc:   Oral   Resp: 17 22  19   Height:      Weight:   111.4 kg (245 lb 9.5 oz)   SpO2: 99% 98%  97%    Intake/Output Summary (Last 24 hours) at 02/12/13 0806 Last data filed at 02/12/13 0600  Gross per 24 hour  Intake   2750 ml  Output   1300 ml  Net   1450 ml   Filed Weights   02/11/13 0102 02/12/13 0415  Weight: 110.8 kg (244 lb 4.3 oz) 111.4 kg (245 lb 9.5 oz)    Exam:   General:  Awake, tearful  Cardiovascular: regular, s1, s2  Respiratory: normal resp effort, no wheezing  Abdomen: soft, nondistended  Musculoskeletal: perfused, no clubbing   Data Reviewed: Basic Metabolic Panel:  Recent Labs Lab 02/10/13 2100 02/11/13 0244  NA 134*  --   K 3.6  --   CL 99  --   CO2 21  --   GLUCOSE 335*  --   BUN 6  --   CREATININE 0.96 0.76  CALCIUM 8.3*  --    Liver Function Tests: No results found for this basename: AST, ALT, ALKPHOS, BILITOT, PROT, ALBUMIN,  in the last 168 hours No results found for this basename: LIPASE, AMYLASE,  in the last 168 hours No results found for this  basename: AMMONIA,  in the last 168 hours CBC:  Recent Labs Lab 02/10/13 2100 02/11/13 0244  WBC 7.0 7.2  NEUTROABS 4.6  --   HGB 12.5* 11.8*  HCT 35.9* 34.5*  MCV 84.1 84.1  PLT 271 231   Cardiac Enzymes:  Recent Labs Lab 02/11/13 0244 02/11/13 0431 02/11/13 1225  TROPONINI <0.30 <0.30 <0.30   BNP (last 3 results) No results found for this basename: PROBNP,  in the last 8760 hours CBG:  Recent Labs Lab 02/11/13 1141 02/11/13 1632 02/11/13 2016 02/12/13 0010 02/12/13 0525  GLUCAP 128* 170* 126* 303* 110*    Recent Results (from the past 240 hour(s))  MRSA PCR SCREENING     Status: None   Collection Time    02/11/13 12:56 AM      Result Value Range Status   MRSA by PCR NEGATIVE  NEGATIVE Final   Comment:            The GeneXpert MRSA Assay (FDA     approved for NASAL specimens     only), is one component of a     comprehensive MRSA colonization     surveillance program. It is not     intended to diagnose MRSA     infection nor to guide  or     monitor treatment for     MRSA infections.     Studies: No results found.  Scheduled Meds: . DULoxetine  60 mg Oral Daily  . enoxaparin (LOVENOX) injection  40 mg Subcutaneous Q24H  . insulin aspart  0-15 Units Subcutaneous Q4H  . pantoprazole  40 mg Oral Daily  . sodium chloride  3 mL Intravenous Q12H   Continuous Infusions: . dextrose 5 % and 0.9% NaCl 125 mL/hr at 02/11/13 1917    Principal Problem:   Overdose of benzodiazepine Active Problems:   DIABETES MELLITUS, TYPE II   HYPERLIPIDEMIA   Depression with anxiety   Suicide attempt by substance overdose   Hypotension arterial   H/O: HTN (hypertension)    Time spent:    Taber Sweetser K  [facility] Pager [phone]. If 7PM-7AM, please contact night-coverage at [email or web address], password [facility] 02/12/2013, 8:06 AM  LOS: 2 days

## 2013-02-13 ENCOUNTER — Encounter (HOSPITAL_COMMUNITY): Payer: Self-pay | Admitting: Psychiatry

## 2013-02-13 DIAGNOSIS — F332 Major depressive disorder, recurrent severe without psychotic features: Principal | ICD-10-CM

## 2013-02-13 DIAGNOSIS — F1021 Alcohol dependence, in remission: Secondary | ICD-10-CM

## 2013-02-13 DIAGNOSIS — F411 Generalized anxiety disorder: Secondary | ICD-10-CM

## 2013-02-13 LAB — GLUCOSE, CAPILLARY: Glucose-Capillary: 250 mg/dL — ABNORMAL HIGH (ref 70–99)

## 2013-02-13 MED ORDER — TRAZODONE HCL 100 MG PO TABS
100.0000 mg | ORAL_TABLET | Freq: Every evening | ORAL | Status: DC | PRN
  Administered 2013-02-13 (×2): 100 mg via ORAL
  Filled 2013-02-13 (×2): qty 1

## 2013-02-13 MED ORDER — IBUPROFEN 200 MG PO TABS
400.0000 mg | ORAL_TABLET | Freq: Four times a day (QID) | ORAL | Status: DC | PRN
  Administered 2013-02-13: 400 mg via ORAL
  Filled 2013-02-13: qty 2

## 2013-02-13 NOTE — [facility] Group Notes (Signed)
[facility] Group Notes:  (Clinical Social Work)  02/13/2013   3:00-4:00PM  Summary of Progress/Problems:   The main focus of today's process group was for the patient to identify ways in which they have sabotaged their own mental health wellness/recovery.  Motivational interviewing was used to explore the reasons they engage in this behavior, and reasons they may have for wanting to change.  The Stages of Change were explained to the group using a handout, and patients identified where they are with regard to changing self-defeating behaviors.  The patient expressed that he has a problem with alcohol, but he will self-sabotage by putting himself in the wrong place, specifically a bar, where he will end up drinking.  He stated he does this because he wants to be part of "the group."  Type of Therapy:  Process Group  Participation Level:  Active  Participation Quality:  Attentive and Sharing  Affect:  Not Congruent  Cognitive:  Oriented  Insight:  Engaged  Engagement in Therapy:  Engaged  Modes of Intervention:  Education, Motivational Interviewing   Ambrose Mantle, LCSW 02/13/2013, 5:17 PM

## 2013-02-13 NOTE — [facility] Suicide Risk Assessment (Signed)
Suicide Risk Assessment  Admission Assessment     Nursing information obtained from:    Demographic factors:    Current Mental Status:    Loss Factors:    Historical Factors:    Risk Reduction Factors:     CLINICAL FACTORS:   Depression:   Comorbid alcohol abuse/dependence  COGNITIVE FEATURES THAT CONTRIBUTE TO RISK:  Polarized thinking Thought constriction (tunnel vision)    SUICIDE RISK:   Moderate:  Frequent suicidal ideation with limited intensity, and duration, some specificity in terms of plans, no associated intent, good self-control, limited dysphoria/symptomatology, some risk factors present, and identifiable protective factors, including available and accessible social support.  PLAN OF CARE: Supportive approach/coping skills/relapse prevention                               Get collateral information                               CBT                               Optimize response to psychotropics    I certify that inpatient services furnished can reasonably be expected to improve the patient's condition.  Torres Hardenbrook A 02/13/2013, 6:45 PM

## 2013-02-13 NOTE — Progress Notes (Signed)
.  Psychoeducational Group Note    Date: 02/13/2013 Time:  0930   Goal Setting Purpose of Group: To be able to set a goal that is measurable and that can be accomplished in one day Participation Level:  Active  Participation Quality:  Attentive  Affect:  Appropriate  Cognitive:  Oriented  Insight:  Improving  Engagement in Group:  Engaged  Additional Comments:    Markey Deady A 

## 2013-02-13 NOTE — Progress Notes (Signed)
Adult Psychoeducational Group Note  Date:  02/13/2013 Time:  8:00 pm  Group Topic/Focus:  Wrap-Up Group:   The focus of this group is to help patients review their daily goal of treatment and discuss progress on daily workbooks.  Participation Level:  Active  Participation Quality:  Appropriate and Sharing  Affect:  Appropriate  Cognitive:  Appropriate  Insight: Good  Engagement in Group:  Engaged  Modes of Intervention:  Discussion, Education, Socialization and Support  Additional Comments:  Pt stated that he is in the hospital because of his doctor. The pt stated that he has great kids and that he is thankful to have a job when asked to share two positive things happening currently in his life.   Lainee Lehrman 02/13/2013, 9:00 PM
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
## 2013-02-14 DIAGNOSIS — F1994 Other psychoactive substance use, unspecified with psychoactive substance-induced mood disorder: Secondary | ICD-10-CM

## 2013-02-14 LAB — GLUCOSE, CAPILLARY

## 2013-02-14 MED ORDER — QUETIAPINE FUMARATE 100 MG PO TABS
100.0000 mg | ORAL_TABLET | Freq: Every day | ORAL | Status: DC
  Administered 2013-02-14: 100 mg via ORAL
  Filled 2013-02-14 (×4): qty 1

## 2013-02-14 NOTE — Progress Notes (Addendum)
Willie [name] is seen  UAL on the unit today..tolerated fair. He remains sa, depressed and has a flat affect. HE attends his groups. He is engaged in trying tio understand his depression and learn healthier coping skills. `   A He takes his meds as scheduled. He completes his am self inventory and on it hewrites he denies SI, he rates his depression and hopelessness "2/1" and he says his DC plan is to " follow plan set up to avoid wife".    R Safety is in place and POC moves forward.

## 2013-02-14 NOTE — Progress Notes (Signed)
Patient ID: Willie [name], male   DOB: 1975-09-04, 37 y.o.   MRN: [id number] D)  Pt shared with RN that his wife is an Charity fundraiser and works in [employer] system, has concerns that she will look at his chart.  States he had had an affair and they are having custody issues, she went online and met someone and has been seeing that person.  He is concerned about the kids, worried about this new person, said his wife has only known him a month, trying to work out their issues.  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  Has been out on the unit this evening, attended group, played cards and interacting appropriately with staff and peers. A)  Will continue to monitor for safety, continue POC R)  Safety maintained.

## 2013-02-14 NOTE — Progress Notes (Signed)
[facility] Group Notes:  (Nursing/MHT/Case Management/Adjunct)  Date:  02/14/2013  Time:  2:37 PM  Type of Therapy:  Psychoeducational Skills  Participation Level:  Active  Participation Quality:  Appropriate  Affect:  Appropriate  Cognitive:  Appropriate  Insight:  Appropriate  Engagement in Group:  Engaged  Modes of Intervention:  Activity, Socialization and Support  Summary of Progress/Problems: Pt attended group and was appropriate.  Kelisha Dall C 02/14/2013, 2:37 PM 

## 2013-02-14 NOTE — Progress Notes (Signed)
Psychoeducational Group Note  Date: 02/14/2013 Time:  0930  Group Topic/Focus:  Gratefulness:  The focus of this group is to help patients identify what two things they are most grateful for in their lives. What helps ground them and to center them on their work to their recovery.  Participation Level:  Active  Participation Quality:  Appropriate  Affect:  Appropriate  Cognitive:  Oriented  Insight:  Improving  Engagement in Group:  Improving  Additional Comments:    Niclas Markell A   

## 2013-02-14 NOTE — Progress Notes (Signed)
Patient ID: Willie [name], male   DOB: 12-05-1975, 37 y.o.   MRN: [id number] D)  Has been out and about on the hall this evening, interacting appropriately with staff and peers.  Attended group, came to med window afterward for hs meds.  Played cards for a short time with several peers, states feeling better, denies thoughts of self harm. A)  Will continue to monitor for safety, continue POC R)  Safety maintained.

## 2013-02-14 NOTE — Progress Notes (Signed)
Nutrition Consult Note  Wt Readings from Last 10 Encounters:  02/12/13 246 lb (111.585 kg)  02/12/13 245 lb 9.5 oz (111.4 kg)  01/11/13 242 lb 8 oz (109.997 kg)  12/31/12 248 lb 4 oz (112.605 kg)  12/09/12 246 lb (111.585 kg)  11/16/12 251 lb (113.853 kg)  10/16/12 258 lb 8 oz (117.255 kg)  10/09/12 258 lb 0.6 oz (117.046 kg)  10/05/12 251 lb 1.3 oz (113.889 kg)  10/03/12 257 lb (116.574 kg)   Body mass index is 34.33 kg/(m^2). Patient meets criteria for obesity based on current BMI.   Discussed intake PTA with patient and compared to intake presently.  Discussed changes in intake, if any, and encouraged adequate intake of meals and snacks. Current diet order is carb modified and pt is also offered choice of unit snacks mid-morning and mid-afternoon.  Pt is eating as desired.   Labs and medications reviewed.   Pt reported his usual body weight as 270 lbs and that he has recently experienced a 24 lb wt loss. He says that he would like to weight 215 lbs. Pt reports an increase in his blood sugar due to a change in medication. He reports that he has started eating healthy and has been eating salads daily along with a protein shake for breakfast. RD discussed with pt healthy nutrition for a carb modified diet and incorporating protein and fiber into the diet to slow the absorption of carbohydrate-containing foods. Provided pt with healthy nutrition therapy handout.  Nutrition Dx:  Unintended wt change r/t suboptimal oral intake AEB pt report  Interventions:   Discussed the importance of nutrition and encouraged intake of food and beverages.    Provided pt with healthy nutrition instructions and handout for controlling blood glucose and cholesterol.    Discussed weight goals with patient.   Supplements: none    No additional nutrition interventions warranted at this time. If nutrition issues arise, please consult RD.   Ebbie Latus RD, LDN

## 2013-02-14 NOTE — [facility] Group Notes (Signed)
[facility] Group Notes:  (Clinical Social Work)  02/14/2013   3:00-4:00PM  Summary of Progress/Problems:   The main focus of today's process group was to   identify the patient's current support system and decide on other supports that can be put in place.  The picture on workbook was used to discuss why additional supports are needed. An emphasis was placed on using counselor, doctor, therapy groups, 12-step groups, and problem-specific support groups to expand supports.  The patient agreed that there is a need to add more supports.  He was then called out of the group and was out for most of the remaining time of group.  Type of Therapy:  Process Group  Participation Level:  Minimal  Participation Quality:  Attentive  Affect:  Not Congruent  Cognitive:  Appropriate  Insight:  Developing/Improving  Engagement in Therapy:  Engaged  Modes of Intervention:  Education,  Support and [employer], LCSW 02/14/2013, 4:36 PM

## 2013-02-14 NOTE — Progress Notes (Signed)
Psychoeducational Group Note  Date:  02/14/2013 Time:  1015  Group Topic/Focus:  Making Healthy Choices:   The focus of this group is to help patients identify negative/unhealthy choices they were using prior to admission and identify positive/healthier coping strategies to replace them upon discharge.  Participation Level:  Active  Participation Quality:  Appropriate  Affect:  Appropriate  Cognitive:  Oriented  Insight:  Improving  Engagement in Group:  Engaged  Additional Comments:    Dione Housekeeper 02/14/2013

## 2013-02-14 NOTE — Progress Notes (Signed)
[facility] MD Progress Note  02/14/2013 2:16 PM ZACKERIAH KISSLER  MRN:  [id number] Subjective:  Willie [name] is up and active in the unit milieu. He wants me to know that he has signed a 72 hour request for discharge. He is very anxious to get back to his new job, for fear that he will lose it. He also states today that it's time for him to move past his wife's new relationship. He realizes now that if she wouldn't come to see him in the hospital, then it's unlikely that she will mend their relationship. He becomes tearful as he opens up about all the text messages and emails phone calls that go unanswered, and he does admit that he is depressed. He has denied this consistently up until now. 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  Negative for fever, chills, weight loss, malaise/fatigue and diaphoresis.  HENT: Negative for congestion and sore throat.   Eyes: Negative for blurred vision, double vision and photophobia.  Respiratory: Negative for cough, shortness of breath and wheezing.   Cardiovascular: Negative for chest pain, palpitations and PND.  Gastrointestinal: Negative for heartburn, nausea, vomiting, abdominal pain, diarrhea and constipation.  Musculoskeletal: Negative for falls, joint pain and myalgias.  Neurological: Negative for dizziness, tingling, tremors, sensory change, speech change, focal weakness, seizures, loss of consciousness, weakness and headaches.  Endo/Heme/Allergies: Negative for polydipsia.  Does not bruise/bleed easily.  Psychiatric/Behavioral: Negative for depression, suicidal ideas, hallucinations, memory loss and substance abuse. The patient is not nervous/anxious and does not have insomnia.     Blood pressure 150/105, pulse 71, temperature 97.1 F (36.2 C), temperature source Oral, resp. rate 20, height 5\' 11"  (1.803 m), weight 111.585 kg (246 lb).Body mass index is 34.33 kg/(m^2).  General Appearance: Casual  Eye Contact::  Fair  Speech:  Clear and Coherent  Volume:  Normal  Mood:  Depressed  Affect:  Tearful  Thought Process:  Goal Directed  Orientation:  Full (Time, Place, and Person)  Thought Content:  WDL  Suicidal Thoughts:  No  Homicidal Thoughts:  No  Memory:  Immediate;   Fair  Judgement:  Impaired  Insight:  Lacking  Psychomotor Activity:  Normal  Concentration:  Poor  Recall:  Fair  Akathisia:  No  Handed:  Right  AIMS (if indicated):     Assets:  Communication Skills Desire for Improvement Housing Physical Health Vocational/Educational  Sleep:  Number of Hours: 6.5   Current Medications: Current Facility-Administered Medications  Medication Dose Route Frequency Provider Last Rate Last Dose  . acetaminophen (TYLENOL) tablet 650 mg  650 mg Oral Q6H PRN Court Joy, PA-C      . alum & mag hydroxide-simeth (MAALOX/MYLANTA) 200-200-20 MG/5ML suspension 30 mL  30 mL Oral Q4H PRN Court Joy, PA-C      . atorvastatin (LIPITOR) tablet 10 mg  10 mg Oral Daily Court Joy, PA-C   10 mg at 02/13/13 2213  . bisoprolol (ZEBETA) tablet 10 mg  10 mg Oral Daily Court Joy, PA-C   10 mg at 02/14/13 0840  .  DULoxetine (CYMBALTA) DR capsule 60 mg  60 mg Oral Daily Court Joy, PA-C   60 mg at 02/13/13 2213  . irbesartan (AVAPRO) tablet 300 mg  300 mg Oral Daily Nehemiah Settle, MD   300 mg at 02/14/13 0840   And  . hydrochlorothiazide (HYDRODIURIL) tablet 25 mg  25 mg Oral Daily Nehemiah Settle, MD   25 mg at 02/14/13 0840   . hydrOXYzine (ATARAX/VISTARIL) tablet 25 mg  25 mg Oral Q4H PRN Court Joy, PA-C   25 mg at 02/14/13 0901  . ibuprofen (ADVIL,MOTRIN) tablet 400 mg  400 mg Oral Q6H PRN Verne Spurr, PA-C   400 mg at 02/13/13 1720  . insulin aspart (novoLOG) injection 0-6 Units  0-6 Units Subcutaneous Once Court Joy, PA-C      . magnesium hydroxide (MILK OF MAGNESIA) suspension 30 mL  30 mL Oral Daily PRN Court Joy, PA-C      . metFORMIN (GLUCOPHAGE) tablet 1,000 mg  1,000 mg Oral BID WC Court Joy, PA-C   1,000 mg at 02/14/13 0840  . pantoprazole (PROTONIX) EC tablet 40 mg  40 mg Oral Daily Court Joy, PA-C   40 mg at 02/13/13 2213  . traZODone (DESYREL) tablet 100 mg  100 mg Oral QHS PRN,MR X 1 Rachael Fee, MD   100 mg at 02/13/13 2310  . valACYclovir (VALTREX) tablet 500 mg  500 mg Oral Daily Court Joy, PA-C   500 mg at 02/14/13 0840    Lab Results:  Results for orders placed during the hospital encounter of 02/12/13 (from the past 48 hour(s))  GLUCOSE, CAPILLARY     Status: Abnormal   Collection Time    02/12/13  9:12 PM      Result Value Range   Glucose-Capillary 206 (*) 70 - 99 mg/dL  GLUCOSE, CAPILLARY     Status: Abnormal   Collection Time    02/13/13  5:32 PM      Result Value Range   Glucose-Capillary 250 (*) 70 - 99 mg/dL   Comment 1 Notify RN    GLUCOSE, CAPILLARY     Status: Abnormal   Collection Time    02/14/13  6:13 AM      Result Value Range   Glucose-Capillary 176 (*) 70 - 99 mg/dL    Physical Findings: AIMS: Facial and Oral Movements Muscles of Facial Expression: None, normal Lips and Perioral Area: None, normal Jaw: None, normal Tongue: None, normal,Extremity Movements Upper (arms, wrists, hands, fingers): None, normal Lower (legs, knees, ankles, toes): None, normal, Trunk Movements Neck, shoulders, hips: None, normal, Overall Severity Severity of abnormal movements (highest score from questions above): None, normal Incapacitation due  to abnormal movements: None, normal Patient's awareness of abnormal movements (rate only patient's report): No Awareness, Dental Status Current problems with teeth and/or dentures?: No Does patient usually wear dentures?: No  CIWA:    COWS:     Treatment Plan Summary: Daily contact with patient to assess and evaluate symptoms and progress in treatment Medication management  Plan: 1. Discussed with the patient benzodiazepine abuse and his ativan is d/cd and xanax is not recommended for patients who are alcohol dependent. Education done. 2. Will continue the Cymbalta. 3. Will D/C trazodone due to his reports of nightmares and poor sleep. 4. Seroquel 100mg  at hs for insomnia and adjuvant therapy for depression. 5. Tx team will need to discuss his discharge tomorrow after this case is reviewed  We  certainly don't want him to lose his new job, but do not want to discharge him too early or without solid follow up. He is at risk for relapse and self harm but has seemed to make a break through today.  Medical Decision Making Problem Points:  Established problem, stable/improving (1) and Review of psycho-social stressors (1) Data Points:  Review of medication regiment & side effects (2) Review of new medications or change in dosage (2)  I certify that inpatient services furnished can reasonably be expected to improve the patient's condition.   Rona Ravens. Mashburn RPAC 7:35 PM 02/14/2013 Agree with assessment and plan Madie Reno A. Dub Mikes, M.D.
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
## 2013-02-14 NOTE — Progress Notes (Signed)
Adult Psychoeducational Group Note  Date:  02/14/2013 Time:  8:57 PM  Group Topic/Focus:  Wrap-Up Group:   The focus of this group is to help patients review their daily goal of treatment and discuss progress on daily workbooks.  Participation Level:  Active  Participation Quality:  Appropriate  Affect:  Appropriate  Cognitive:  Alert  Insight: Appropriate  Engagement in Group:  Engaged  Modes of Intervention:  Support  Additional Comments:  Pt stated he had a great day today. His sister and mom visited today. He would like a work note for this week.   Rodman Key [facility] 02/14/2013, 8:57 PM

## 2013-02-15 DIAGNOSIS — F411 Generalized anxiety disorder: Secondary | ICD-10-CM

## 2013-02-15 DIAGNOSIS — F1010 Alcohol abuse, uncomplicated: Secondary | ICD-10-CM

## 2013-02-15 LAB — GLUCOSE, CAPILLARY
Glucose-Capillary: 186 mg/dL — ABNORMAL HIGH (ref 70–99)
Glucose-Capillary: 177 mg/dL — ABNORMAL HIGH (ref 70–99)

## 2013-02-15 MED ORDER — QUETIAPINE FUMARATE 100 MG PO TABS: 100.0000 mg | ORAL_TABLET | Freq: Every day | ORAL | Status: DC

## 2013-02-15 MED ORDER — PANTOPRAZOLE SODIUM 40 MG PO TBEC: 40.0000 mg | DELAYED_RELEASE_TABLET | Freq: Every day | ORAL | Status: DC

## 2013-02-15 MED ORDER — HYDROXYZINE HCL 25 MG PO TABS: 25.0000 mg | ORAL_TABLET | ORAL | Status: DC | PRN

## 2013-02-15 MED ORDER — DULOXETINE HCL 60 MG PO CPEP: 60.0000 mg | ORAL_CAPSULE | Freq: Every day | ORAL | Status: DC

## 2013-02-15 NOTE — Progress Notes (Signed)
Pt discharged per MD orders; pt currently denies SI/HI and auditory/visual hallucinations; pt was given education by RN regarding follow-up appointments and medications and pt denied any questions or concerns about these instructions; pt was then escorted to search room to retrieve his belongings by RN before being discharged to hospital lobby. 

## 2013-02-15 NOTE — Progress Notes (Signed)
Patient hypertensive today (BP 165/120, HR 80 at 1150). Patient presents with no symptoms or complaints. MD and NP notified of patient status. Patient denies any concerns and states that he will follow-up with his primary care physician.

## 2013-02-15 NOTE — [facility] Suicide Risk Assessment (Signed)
[facility] INPATIENT:  Family/Significant Other Suicide Prevention Education  Suicide Prevention Education:  Education Completed; Kaiyden Simkin, Brother, [phone]; has been identified by the patient as the family member/significant other with whom the patient will be residing, and identified as the person(s) who will aid the patient in the event of a mental health crisis (suicidal ideations/suicide attempt).  With written consent from the patient, the family member/significant other has been provided the following suicide prevention education, prior to the and/or following the discharge of the patient.  The suicide prevention education provided includes the following:  Suicide risk factors  Suicide prevention and interventions  National Suicide Hotline telephone number  [facility] assessment telephone number  [facility] Emergency Assistance 911  [facility] and/or Residential Mobile Crisis Unit telephone number  Request made of family/significant other to:  Remove weapons (e.g., guns, rifles, knives), all items previously/currently identified as safety concern. Brother advised patient does not have access to weapons.     Remove drugs/medications (over-the-counter, prescriptions, illicit drugs), all items previously/currently identified as a safety concern.  The family member/significant other verbalizes understanding of the suicide prevention education information provided.  The family member/significant other agrees to remove the items of safety concern listed above.  Wynn Banker 02/15/2013, 12:45 PM

## 2013-02-15 NOTE — Discharge Summary (Signed)
Physician Discharge Summary Note  Patient:  Willie [name] is an 37 y.o., male MRN:  [id number] DOB:  [birth year] Patient phone:  [phone] (home)  Patient address:   [address] [place] [address],   Date of Admission:  02/12/2013 Date of Discharge: 02/15/2013  Reason for Admission:  Depression and intentional overdose  Discharge Diagnoses: Active Problems:   Depression with anxiety   Other and unspecified alcohol dependence, unspecified drinking behavior  Review of Systems  Constitutional: Negative.   HENT: Negative.   Eyes: Negative.   Respiratory: Negative.   Cardiovascular: Negative.   Gastrointestinal: Negative.   Genitourinary: Negative.   Musculoskeletal: Negative.   Skin: Negative.   Neurological: Negative.   Endo/Heme/Allergies: Negative.   Psychiatric/Behavioral: The patient is nervous/anxious.     DSM5:  Substance/Addictive Disorders:  Alcohol Related Disorder - Severe (303.90) Depressive Disorders:  Major Depressive Disorder - Severe (296.23)  Axis Diagnosis:   AXIS I:  Alcohol Abuse, Anxiety Disorder NOS and Major Depression, Recurrent severe AXIS II:  Deferred AXIS III:   Past Medical History  Diagnosis Date  . Alcoholic pancreatitis     recurrent  . Diabetes mellitus, type II     New Onset 03/2010  . Hypertension   . 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 High cholesterol   . Sleep apnea   . GERD (gastroesophageal reflux disease)   . Depression    AXIS IV:  other psychosocial or environmental problems, problems related to social environment and problems with primary support group AXIS V:  61-70 mild symptoms  Level of Care:  OP  Hospital Course:  On admission:  37 year old male who is accepted on transfer from the medical unit at [facility] where he was admitted after an overdose of Ativan. He states he took 2 Ativan before he met with his wife from whom he is separated and  then took three more when he returned to his apartment. He denies it was a suicide attempt. He states that he became more upset when he found that his wife had moved another man into their home after only knowing him one month. He received a psychiatric consult after being admitted to the medical floor that recommended in patient psychiatric admission when medically stable.  During hospitalization:  Medications managed--Xanax and Vicodin not continued during inpatient, his medical medications were continued along with Cymbalta 60 mg for depression.  Vistaril 25 mg every 6 hours PRN anxiety, Seroquel 100 mg at bedtime for sleep and mood stability started.  Patient attended and participated in therapy sessions.  He denied suicidal/homicidal ideations and auditory/visual hallucinations, follow-up appointments encouraged to attend, Rx given at discharge.  Willie [name] is mentally and physically stable for discharge.  Consults:  None  Significant Diagnostic Studies:  labs: completed, reviewed, stable  Discharge Vitals:   Blood pressure 150/108, pulse 75, temperature 97.1 F (36.2 C), temperature source Oral, resp. rate 20, height 5\' 11"  (1.803 m), weight 111.585 kg (246 lb). Body mass index is 34.33 kg/(m^2). Lab Results:   Results for orders placed during the hospital encounter of 02/12/13 (from the past 72 hour(s))  GLUCOSE, CAPILLARY     Status: Abnormal   Collection Time    02/12/13  9:12 PM      Result Value Range   Glucose-Capillary 206 (*) 70 - 99 mg/dL  GLUCOSE, CAPILLARY     Status: Abnormal   Collection Time  02/13/13  5:32 PM      Result Value Range   Glucose-Capillary 250 (*) 70 - 99 mg/dL   Comment 1 Notify RN    GLUCOSE, CAPILLARY     Status: Abnormal   Collection Time    02/14/13  6:13 AM      Result Value Range   Glucose-Capillary 176 (*) 70 - 99 mg/dL  GLUCOSE, CAPILLARY     Status: Abnormal   Collection Time    02/14/13  5:10 PM      Result Value Range   Glucose-Capillary  231 (*) 70 - 99 mg/dL   Comment 1 Documented in Chart     Comment 2 Notify RN    GLUCOSE, CAPILLARY     Status: Abnormal   Collection Time    02/15/13  6:45 AM      Result Value Range   Glucose-Capillary 186 (*) 70 - 99 mg/dL   Comment 1 Notify RN    GLUCOSE, CAPILLARY     Status: Abnormal   Collection Time    02/15/13 11:52 AM      Result Value Range   Glucose-Capillary 177 (*) 70 - 99 mg/dL   Comment 1 Notify RN      Physical Findings: AIMS: Facial and Oral Movements Muscles of Facial Expression: None, normal Lips and Perioral Area: None, normal Jaw: None, normal Tongue: None, normal,Extremity Movements Upper (arms, wrists, hands, fingers): None, normal Lower (legs, knees, ankles, toes): None, normal, Trunk Movements Neck, shoulders, hips: None, normal, Overall Severity Severity of abnormal movements (highest score from questions above): None, normal Incapacitation due to abnormal movements: None, normal Patient's awareness of abnormal movements (rate only patient's report): No Awareness, Dental Status Current problems with teeth and/or dentures?: No Does patient usually wear dentures?: No  CIWA:    COWS:     Psychiatric Specialty Exam: See Psychiatric Specialty Exam and Suicide Risk Assessment completed by Attending Physician prior to discharge.  Discharge destination:  Home  Is patient on multiple antipsychotic therapies at discharge:  No   Has Patient had three or more failed trials of antipsychotic monotherapy by history:  No  Recommended Plan for Multiple Antipsychotic Therapies: NA  Discharge Orders   Future Orders Complete By Expires   Activity as tolerated - No restrictions  As directed    Diet - low sodium heart healthy  As directed        Medication List    STOP taking these medications       ALPRAZolam 0.5 MG tablet  Commonly known as:  XANAX     HYDROcodone-acetaminophen 5-325 MG per tablet  Commonly known as:  NORCO/VICODIN     multivitamin  Tabs tablet     valACYclovir 500 MG tablet  Commonly known as:  VALTREX      TAKE these medications     Indication   atorvastatin 10 MG tablet  Commonly known as:  LIPITOR  Take 1 tablet (10 mg total) by mouth daily.   Indication:  hyperlipidemia     BENICAR HCT 40-25 MG per tablet  Generic drug:  olmesartan-hydrochlorothiazide  Take 1 tablet by mouth daily.   Indication:  High Blood Pressure     bisoprolol 10 MG tablet  Commonly known as:  ZEBETA  Take 1 tablet (10 mg total) by mouth daily.   Indication:  High Blood Pressure     DULoxetine 60 MG capsule  Commonly known as:  CYMBALTA  Take 1 capsule (60 mg total) by mouth  daily.   Indication:  Major Depressive Disorder, Neuropathic Pain     hydrOXYzine 25 MG tablet  Commonly known as:  ATARAX/VISTARIL  Take 1 tablet (25 mg total) by mouth every 4 (four) hours as needed for anxiety.      metFORMIN 1000 MG tablet  Commonly known as:  GLUCOPHAGE  Take 1 tablet (1,000 mg total) by mouth 2 (two) times daily with a meal.   Indication:  Type 2 Diabetes     pantoprazole 40 MG tablet  Commonly known as:  PROTONIX  Take 1 tablet (40 mg total) by mouth daily.   Indication:  Gastroesophageal Reflux Disease     QUEtiapine 100 MG tablet  Commonly known as:  SEROQUEL  Take 1 tablet (100 mg total) by mouth at bedtime.   Indication:  Trouble Sleeping, mood stability           Follow-up Information   Follow up with Dallie Dad -  [facility] On 02/17/2013. (Wednesday February 17, 2013 at 9A M)    Contact information:   [address]      Follow-up recommendations:  Activity:  as tolerated Diet:  low-sodium heart healthy diet  Comments:  Patient will continue his care at [facility].  Total Discharge Time:  Greater than 30 minutes.  SignedNanine [name], PMh-NP 02/15/2013, 11:58 AM  Patient is seen personally for psych assessment, suicidal risk assessment and discussed with physician extenders. Developed  discharge treatment plans and reviewed the information documented and agree with the treatment plan.  Willie [name]., MD 02/16/2013 1:06 PM

## 2013-02-15 NOTE — Progress Notes (Signed)
Adult Psychoeducational Group Note  Date:  02/15/2013 Time:  11:00am Group Topic/Focus:  Self Care:   The focus of this group is to help patients understand the importance of self-care in order to improve or restore emotional, physical, spiritual, interpersonal, and financial health.  Participation Level:  Active  Participation Quality:  Appropriate and Attentive  Affect:  Appropriate  Cognitive:  Alert and Appropriate  Insight: Appropriate  Engagement in Group:  Engaged  Modes of Intervention:  Discussion and Education  Additional Comments:    Pt attended and participated in group. Discussion today was on self-care and the question was asked what does self care mean to you and what is your biggest struggle? Pt stated self care means reaching out to others when you need help,developing healthy choices. Pt stated his biggest struggle is moving on.  Shelly Bombard D 02/15/2013, 1:40 PM

## 2013-02-15 NOTE — Tx Team (Signed)
Interdisciplinary Treatment Plan Update   Date Reviewed:  02/15/2013  Time Reviewed:  9:37 AM  Progress in Treatment:   Attending groups: Yes Participating in groups: Yes Taking medication as prescribed: Yes  Tolerating medication: Yes Family/Significant other contact made: Yes, contact made with brother  Patient understands diagnosis: Yes  Discussing patient identified problems/goals with staff: Yes Medical problems stabilized or resolved: Yes Denies suicidal/homicidal ideation: Yes Patient has not harmed self or others: Yes  For review of initial/current patient goals, please see plan of care.  Estimated Length of Stay:  Discharge today  Reasons for Continued Hospitalization:   New Problems/Goals identified:    Discharge Plan or Barriers:   Home with outpatient follow up [facility] and Dr. Jannifer Franklin  Additional Comments:  Attendees:  Patient:  02/15/2013 9:37 AM   Signature: Mervyn Gay, MD 02/15/2013 9:37 AM  Signature:  02/15/2013 9:37 AM  Signature:  02/15/2013 9:37 AM  Signature:Beverly Terrilee Croak, RN 02/15/2013 9:37 AM  Signature:  Neill Loft RN 02/15/2013 9:37 AM  Signature:  Juline Patch, LCSW 02/15/2013 9:37 AM  Signature:  Reyes Ivan, LCSW 02/15/2013 9:37 AM  Signature:  Sharin Grave Coordinator 02/15/2013 9:37 AM  Signature:   02/15/2013 9:37 AM  Signature:  02/15/2013  9:37 AM  Signature:   02/15/2013  9:37 AM  Signature:   02/15/2013  9:37 AM    Scribe for Treatment Team:   Juline Patch,  02/15/2013 9:37 AM

## 2013-02-15 NOTE — Progress Notes (Signed)
[facility] Adult Case Management Discharge Plan :  Will you be returning to the same living situation after discharge: Yes,  Patient is returning to his home At discharge, do you have transportation home?:Yes,  Brother to transport patient home. Do you have the ability to pay for your medications:Yes,  Patient is able to obtain medications.  Release of information consent forms completed and in the chart;  Patient's signature needed at discharge.  Patient to Follow up at: Follow-up Information   Follow up with Dallie Dad -  [facility] On 02/17/2013. (Wednesday February 17, 2013 at 9A M)    Contact information:   [address]      Follow up with Dr. Jannifer Franklin - [facility] On 03/01/2013. (Monday, March 01, 2013 at 4;30 PM)    Contact information:   [address] [place], [place]   [address]  [phone]      Patient denies SI/HI:   Patient no longer endorsing SI/HI or other thoughts of self harm.     Safety Planning and Suicide Prevention discussed:  .Reviewed with all patients during discharge planning group   Willie [name], Willie [name] July 02/15/2013, 2:42 PM

## 2013-02-15 NOTE — [facility] Suicide Risk Assessment (Signed)
Suicide Risk Assessment  Discharge Assessment     Demographic Factors:  Male, Adolescent or young adult, Caucasian and Low socioeconomic status  Mental Status Per Nursing Assessment::   On Admission:     Current Mental Status by Physician: NA  Loss Factors: Loss of significant relationship and Financial problems/change in socioeconomic status  Historical Factors: Prior suicide attempts and Impulsivity  Risk Reduction Factors:   Sense of responsibility to family, Religious beliefs about death, Positive social support, Positive therapeutic relationship and Positive coping skills or problem solving skills  Continued Clinical Symptoms:  Depression:   Impulsivity Recent sense of peace/wellbeing Alcohol/Substance Abuse/Dependencies Medical Diagnoses and Treatments/Surgeries  Cognitive Features That Contribute To Risk:  Polarized thinking    Suicide Risk:  Minimal: No identifiable suicidal ideation.  Patients presenting with no risk factors but with morbid ruminations; may be classified as minimal risk based on the severity of the depressive symptoms  Discharge Diagnoses:   AXIS I:  Depressive Disorder NOS, Substance Induced Mood Disorder and Alcohol abuse vs dependence AXIS II:  Deferred AXIS III:   Past Medical History  Diagnosis Date  . Alcoholic pancreatitis     recurrent  . Diabetes mellitus, type II     New Onset 03/2010  . Hypertension   . 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 High cholesterol   . Sleep apnea   . GERD (gastroesophageal reflux disease)   . Depression    AXIS IV:  economic problems, occupational problems, other psychosocial or environmental problems, problems related to social environment and problems with primary support group AXIS V:  61-70 mild symptoms  Plan Of Care/Follow-up recommendations:  Activity:  as tolerated Diet:  Regular  Is patient on multiple antipsychotic therapies at  discharge:  No   Has Patient had three or more failed trials of antipsychotic monotherapy by history:  No  Recommended Plan for Multiple Antipsychotic Therapies: NA  Nehemiah Settle., MD 02/15/2013, 12:41 PM

## 2013-02-18 NOTE — Progress Notes (Signed)
Patient Discharge Instructions:  After Visit Summary (AVS):   Faxed to:  02/18/13 Discharge Summary Note:   Faxed to:  02/18/13 Psychiatric Admission Assessment Note:   Faxed to:  02/18/13 Suicide Risk Assessment - Discharge Assessment:   Faxed to:  02/18/13 Faxed/Sent to the Next Level Care provider:  02/18/13 Faxed to [facility] @ [phone] Faxed to [facility] @ [phone]  Jerelene Redden, 02/18/2013, 3:29 PM

## 2013-03-04 ENCOUNTER — Other Ambulatory Visit: Payer: Self-pay

## 2013-03-27 ENCOUNTER — Emergency Department (HOSPITAL_BASED_OUTPATIENT_CLINIC_OR_DEPARTMENT_OTHER): Payer: 59

## 2013-03-27 ENCOUNTER — Emergency Department (HOSPITAL_BASED_OUTPATIENT_CLINIC_OR_DEPARTMENT_OTHER)
Admission: EM | Admit: 2013-03-27 | Discharge: 2013-03-27 | Disposition: A | Discharge status: Home / Self Care | Payer: 59 | Attending: Emergency Medicine | Admitting: Emergency Medicine

## 2013-03-27 ENCOUNTER — Encounter (HOSPITAL_BASED_OUTPATIENT_CLINIC_OR_DEPARTMENT_OTHER): Payer: Self-pay | Admitting: Emergency Medicine

## 2013-03-27 DIAGNOSIS — F10929 Alcohol use, unspecified with intoxication, unspecified: Secondary | ICD-10-CM

## 2013-03-27 DIAGNOSIS — F1010 Alcohol abuse, uncomplicated: Secondary | ICD-10-CM | POA: Insufficient documentation

## 2013-03-27 DIAGNOSIS — S61512A Laceration without foreign body of left wrist, initial encounter: Secondary | ICD-10-CM

## 2013-03-27 DIAGNOSIS — E7800 Pure hypercholesterolemia, unspecified: Secondary | ICD-10-CM | POA: Insufficient documentation

## 2013-03-27 DIAGNOSIS — E119 Type 2 diabetes mellitus without complications: Secondary | ICD-10-CM | POA: Insufficient documentation

## 2013-03-27 DIAGNOSIS — Z23 Encounter for immunization: Secondary | ICD-10-CM | POA: Insufficient documentation

## 2013-03-27 DIAGNOSIS — Z8719 Personal history of other diseases of the digestive system: Secondary | ICD-10-CM | POA: Insufficient documentation

## 2013-03-27 DIAGNOSIS — F3289 Other specified depressive episodes: Secondary | ICD-10-CM | POA: Insufficient documentation

## 2013-03-27 DIAGNOSIS — Z79899 Other long term (current) drug therapy: Secondary | ICD-10-CM | POA: Insufficient documentation

## 2013-03-27 DIAGNOSIS — Y939 Activity, unspecified: Secondary | ICD-10-CM | POA: Insufficient documentation

## 2013-03-27 DIAGNOSIS — W2209XA Striking against other stationary object, initial encounter: Secondary | ICD-10-CM | POA: Insufficient documentation

## 2013-03-27 DIAGNOSIS — Y929 Unspecified place or not applicable: Secondary | ICD-10-CM | POA: Insufficient documentation

## 2013-03-27 DIAGNOSIS — I10 Essential (primary) hypertension: Secondary | ICD-10-CM | POA: Insufficient documentation

## 2013-03-27 DIAGNOSIS — S61509A Unspecified open wound of unspecified wrist, initial encounter: Secondary | ICD-10-CM | POA: Insufficient documentation

## 2013-03-27 DIAGNOSIS — K219 Gastro-esophageal reflux disease without esophagitis: Secondary | ICD-10-CM | POA: Insufficient documentation

## 2013-03-27 DIAGNOSIS — F329 Major depressive disorder, single episode, unspecified: Secondary | ICD-10-CM | POA: Insufficient documentation

## 2013-03-27 LAB — RAPID STREP SCREEN (MED CTR MEBANE ONLY): Streptococcus, Group A Screen (Direct): NEGATIVE

## 2013-03-27 MED ORDER — TETANUS-DIPHTH-ACELL PERTUSSIS 5-2.5-18.5 LF-MCG/0.5 IM SUSP
0.5000 mL | Freq: Once | INTRAMUSCULAR | Status: AC
  Administered 2013-03-27: 0.5 mL via INTRAMUSCULAR
  Filled 2013-03-27: qty 0.5

## 2013-03-27 NOTE — ED Provider Notes (Addendum)
CSN: [id number]     Arrival date & time 03/27/13  0415 History   First MD Initiated Contact with Patient 03/27/13 [phone]     Chief Complaint  Patient presents with  . Extremity Laceration  . Alcohol Intoxication   (Consider location/radiation/quality/duration/timing/severity/associated sxs/prior Treatment) HPI Comments: Pt is a 37 y.o. male with Pmhx as above who presents with laceration to L wrist after he punched a broken TV on the wall 3 times because he was angry.  Two visitors corroborate the story and one has video of the broken TV.  He has been drinking tequila this evening.  Patient is a 37 y.o. male presenting with wrist pain. The history is provided by the patient. No language interpreter was used.  Wrist Pain This is a new problem. The current episode started less than 1 hour ago. The problem occurs constantly. The problem has not changed since onset.Pertinent negatives include no chest pain, no abdominal pain, no headaches and no shortness of breath. Nothing aggravates the symptoms. Nothing relieves the symptoms. He has tried nothing for the symptoms. The treatment provided no relief.    Past Medical History  Diagnosis Date  . Alcoholic pancreatitis     recurrent  . Diabetes mellitus, type II     New Onset 03/2010  . Hypertension   . 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 High cholesterol   . Sleep apnea   . GERD (gastroesophageal reflux disease)   . Depression    Past Surgical History  Procedure Laterality Date  . Lumbar microdiscectomy  ~ 2004    Dr Danielle Dess   Family History  Problem Relation Age of Onset  . Diabetes Mother   . Lung cancer Brother     twin brother  . Colon cancer Neg Hx   . Pancreatic cancer Paternal Aunt    History  Substance Use Topics  . Smoking status: Never Smoker   . Smokeless tobacco: Never Used  . Alcohol Use: 12.0 oz/week    15 Cans of beer, 5 Shots of liquor per week     Comment:  08/04/2012 "maybe 15 beers/wk; I don't drink qd"   12/31/2012 "switched to mostly liquor, but trying to cut back"    Review of Systems  Constitutional: Negative for fever, activity change, appetite change and fatigue.  HENT: Negative for congestion, facial swelling, rhinorrhea and trouble swallowing.   Eyes: Negative for photophobia and pain.  Respiratory: Negative for cough, chest tightness and shortness of breath.   Cardiovascular: Negative for chest pain and leg swelling.  Gastrointestinal: Negative for nausea, vomiting, abdominal pain, diarrhea and constipation.  Endocrine: Negative for polydipsia and polyuria.  Genitourinary: Negative for dysuria, urgency, decreased urine volume and difficulty urinating.  Musculoskeletal: Negative for back pain and gait problem.  Skin: Positive for wound. Negative for color change and rash.  Allergic/Immunologic: Negative for immunocompromised state.  Neurological: Negative for dizziness, facial asymmetry, speech difficulty, weakness, numbness and headaches.  Psychiatric/Behavioral: Negative for confusion, decreased concentration and agitation.    Allergies  Review of patient's allergies indicates no known allergies.  Home Medications   Current Outpatient Rx  Name  Route  Sig  Dispense  Refill  . atorvastatin (LIPITOR) 10 MG tablet   Oral   Take 1 tablet (10 mg total) by mouth daily.   90 tablet   0   . bisoprolol (ZEBETA) 10 MG tablet   Oral   Take  1 tablet (10 mg total) by mouth daily.   90 tablet   1   . DULoxetine (CYMBALTA) 60 MG capsule   Oral   Take 1 capsule (60 mg total) by mouth daily.   30 capsule   0   . hydrOXYzine (ATARAX/VISTARIL) 25 MG tablet   Oral   Take 1 tablet (25 mg total) by mouth every 4 (four) hours as needed for anxiety.   30 tablet   0   . metFORMIN (GLUCOPHAGE) 1000 MG tablet   Oral   Take 1 tablet (1,000 mg total) by mouth 2 (two) times daily with a meal.         . olmesartan-hydrochlorothiazide  (BENICAR HCT) 40-25 MG per tablet   Oral   Take 1 tablet by mouth daily.   90 tablet   1   . pantoprazole (PROTONIX) 40 MG tablet   Oral   Take 1 tablet (40 mg total) by mouth daily.   30 tablet   0   . QUEtiapine (SEROQUEL) 100 MG tablet   Oral   Take 1 tablet (100 mg total) by mouth at bedtime.   30 tablet   0    BP 101/68  Pulse 115  Temp(Src) 98.4 F (36.9 C) (Oral)  Resp 16  Ht 5\' 11"  (1.803 m)  SpO2 98% Physical Exam  Constitutional: He is oriented to person, place, and time. He appears well-developed and well-nourished. No distress.  HENT:  Head: Normocephalic and atraumatic.  Mouth/Throat: No oropharyngeal exudate.  Eyes: Pupils are equal, round, and reactive to light.  Neck: Normal range of motion. Neck supple.  Cardiovascular: Normal rate, regular rhythm and normal heart sounds.  Exam reveals no gallop and no friction rub.   No murmur heard. Pulmonary/Chest: Effort normal and breath sounds normal. No respiratory distress. He has no wheezes. He has no rales.  Abdominal: Soft. Bowel sounds are normal. He exhibits no distension and no mass. There is no tenderness. There is no rebound and no guarding.  Musculoskeletal: Normal range of motion. He exhibits no edema and no tenderness.       Left wrist: He exhibits laceration.       Arms: Neurological: He is alert and oriented to person, place, and time.  Skin: Skin is warm and dry.  Psychiatric: He has a normal mood and affect.    ED Course  LACERATION REPAIR Date/Time: 03/27/2013 6:19 AM Performed by: Toy Cookey, E Authorized by: Toy Cookey, E Consent: Verbal consent obtained. written consent not obtained. Risks and benefits: risks, benefits and alternatives were discussed Consent given by: patient Patient understanding: patient states understanding of the procedure being performed Patient consent: the patient's understanding of the procedure matches consent given Procedure consent: procedure  consent matches procedure scheduled Relevant documents: relevant documents present and verified Test results: test results available and properly labeled Site marked: the operative site was marked Imaging studies: imaging studies available Required items: required blood products, implants, devices, and special equipment available Patient identity confirmed: verbally with patient Time out: Immediately prior to procedure a "time out" was called to verify the correct patient, procedure, equipment, support staff and site/side marked as required. Body area: upper extremity Location details: left wrist Laceration length: 2 cm Foreign bodies: no foreign bodies Tendon involvement: none Nerve involvement: none Vascular damage: no Anesthesia: local infiltration Local anesthetic: lidocaine 1% with epinephrine Anesthetic total: 3 ml Patient sedated: no Preparation: Patient was prepped and draped in the usual sterile fashion. Irrigation solution: saline  Amount of cleaning: standard Debridement: none Degree of undermining: none Skin closure: 5-0 nylon Number of sutures: 6 Technique: simple Approximation: close Approximation difficulty: simple Dressing: 4x4 sterile gauze, antibiotic ointment and gauze roll   (including critical care time) Labs Review Labs Reviewed - No data to display Imaging Review Dg Wrist Complete Left  03/27/2013   CLINICAL DATA:  Patient punched a television with anterior wrist laceration.  EXAM: LEFT WRIST - COMPLETE 3+ VIEW  COMPARISON:  None.  FINDINGS: No evidence of acute fracture or subluxation. 2 tiny subcutaneous soft tissue foreign bodies are demonstrated in the soft tissues along the dorsal lateral aspect of the left wrist, at the level of the triquetrum and hamate.  IMPRESSION: Tiny subcutaneous soft tissue foreign bodies demonstrated in the soft tissues along the dorsal lateral aspect of the left wrist. No acute bony abnormalities.   Electronically Signed   By:  Burman Nieves M.D.   On: 03/27/2013 05:32   Dg Hand Complete Right  03/27/2013   CLINICAL DATA:  Pain over the thumb and middle finger.  EXAM: RIGHT HAND - COMPLETE 3+ VIEW  COMPARISON:  None.  FINDINGS: Three views of the right hand are negative for a fracture or dislocation. Alignment of the hand is normal. No gross soft tissue abnormality.  IMPRESSION: No acute bone abnormality to the right hand.   Electronically Signed   By: Richarda Overlie M.D.   On: 03/27/2013 07:09    EKG Interpretation   None       MDM   1. Alcohol intoxication   2. Wrist laceration, left, initial encounter    Pt is a 37 y.o. male with Pmhx as above who presents with laceration to L wrist after he punched a broken TV on the wall 3 times because he was angry.  Two visitors corroborate the story and one has video of the broken TV.  He has been drinking tequila this evening. On PE, pt belligerent, intoxicated.  NVI distally, bleeding controlled.  Laceration about 1cm, irregularly shaped, no c/w self-induced injury.  XR ordered to r/o FB was negative for FB at wound site, but did have two superficial FBs opposite wound that were removed.  Tetanus updated.  Lac repaired.  Pt then complained of pain over R thumb, first, second digit and XR R hand ordered which was negative for acute bony fracture.  Pt will be allowed to metabolized ETOH in ED prior to d/c.  Pt denies SI on repeat exam.         Shanna Cisco, MD 03/27/13 1610  Shanna Cisco, MD 03/27/13 [phone]

## 2013-03-27 NOTE — ED Notes (Signed)
Pt used urinal in room.

## 2013-03-27 NOTE — ED Notes (Addendum)
Pt states that he repeatedly punched a TV due to being mad. Pt states that he has been drinking tequila. Pt complaining of pain to left wrist. Pt appears intoxicated, pt cries and states that he wants to be a xxx patient due to his wife being a nurse and he does not want her to know that he is here. Pt friend and friend's daughter at bedside approved by patient.  Pt denies SI or HI.

## 2013-03-27 NOTE — ED Notes (Signed)
Per EMS: pt punched a TV. Pt has a laceration to his left wrsit with dressing applied no bleeding noted on bandage.

## 2013-03-27 NOTE — ED Notes (Addendum)
[phone] [name], pt friend for calling for discharge.

## 2013-03-27 NOTE — ED Notes (Signed)
EDP at bedside. Removed bandage for EMS. Pt has a laceration to left supine wrist approx. 3cm long and a clot has formed. Dressing reapplied. Pt unaware of last tetnaus shot. Pt friend has a picture of TV with several pieces of broken glass.

## 2013-03-27 NOTE — ED Notes (Signed)
EDP at bedside to suture patients wrist. Area cleaned, Dressing applied and bleeding controlled.

## 2013-03-27 NOTE — ED Notes (Signed)
Pt sleeping, can see chest rise and fall. Pt audible snoring.

## 2013-03-27 NOTE — ED Notes (Signed)
Pt removed dressing and clot from wrist came out. Pt bleeding, dressing reapplied and pt cleaned up.

## 2013-03-29 LAB — CULTURE, GROUP A STREP

## 2013-04-02 ENCOUNTER — Telehealth: Payer: Self-pay | Admitting: Family

## 2013-04-02 MED ORDER — DULOXETINE HCL 60 MG PO CPEP: 60.0000 mg | ORAL_CAPSULE | Freq: Every day | ORAL | Status: DC

## 2013-04-02 NOTE — Telephone Encounter (Signed)
Informed patient of medication refill and he scheduled appointment for 04/07/13

## 2013-04-02 NOTE — Telephone Encounter (Signed)
Refill on cymbalta 60mg  cap take 1 by mouth daily, qty 30 last filled 02/19/13

## 2013-04-02 NOTE — Telephone Encounter (Signed)
30 day supply sent to pharmacy. Pt is due for follow up this month. Further refills will not be given until pt is seen in the office. Please call pt to arrange.

## 2013-04-07 ENCOUNTER — Encounter: Payer: Self-pay | Admitting: Family

## 2013-04-07 ENCOUNTER — Ambulatory Visit (INDEPENDENT_AMBULATORY_CARE_PROVIDER_SITE_OTHER): Payer: 59 | Admitting: Family

## 2013-04-07 VITALS — BP 130/90 | HR 101 | Temp 97.6°F | Resp 16 | Ht 70.5 in | Wt 237.0 lb

## 2013-04-07 DIAGNOSIS — M545 Low back pain, unspecified: Secondary | ICD-10-CM

## 2013-04-07 DIAGNOSIS — R0989 Other specified symptoms and signs involving the circulatory and respiratory systems: Secondary | ICD-10-CM

## 2013-04-07 DIAGNOSIS — R0609 Other forms of dyspnea: Secondary | ICD-10-CM

## 2013-04-07 DIAGNOSIS — F418 Other specified anxiety disorders: Secondary | ICD-10-CM

## 2013-04-07 DIAGNOSIS — IMO0002 Reserved for concepts with insufficient information to code with codable children: Secondary | ICD-10-CM | POA: Insufficient documentation

## 2013-04-07 DIAGNOSIS — R0683 Snoring: Secondary | ICD-10-CM | POA: Insufficient documentation

## 2013-04-07 DIAGNOSIS — T148XXA Other injury of unspecified body region, initial encounter: Secondary | ICD-10-CM

## 2013-04-07 DIAGNOSIS — F341 Dysthymic disorder: Secondary | ICD-10-CM

## 2013-04-07 MED ORDER — METHYLPREDNISOLONE 4 MG PO KIT: PACK | ORAL | Status: DC

## 2013-04-07 MED ORDER — CEPHALEXIN 500 MG PO CAPS: 500.0000 mg | ORAL_CAPSULE | Freq: Two times a day (BID) | ORAL | Status: DC

## 2013-04-07 NOTE — Assessment & Plan Note (Signed)
Obtain mri lumbar spine.  Medrol dose pak

## 2013-04-07 NOTE — Assessment & Plan Note (Signed)
Improving, advised pt to try seroquel 50mg  hs to see if he can better tolerate.

## 2013-04-07 NOTE — Progress Notes (Signed)
Subjective:    Patient ID: Willie [name], male    DOB: 12-17-1975, 37 y.o.   MRN: [id number]  HPI  Willie [name] is a 37 yr old male who presents today for follow up. He was evaluated in the ED on 11/29 due to laceration on the left wrist. This occurred after the pt punched a broken TV on the wall.    Depression-  He was admitted to [facility] health 10/17-10/20/14 following an intentional overdose of ativan.  He cannot afford to follow up with psychiatry.  Reports that he continues cymbalta.  Could not tolerate seroquel due to somnolence.  Denies panic attacks.    He reports low back pain.  Has trouble sitting down for long periods of time.  He reports that he does have some numbenss in the right leg. Reports associated low back spasms- worse when he tries to stand up.  He has tried aleve, tylenol, ibuprofen without relief of symptoms.    OSA-  Was told that he probably has sleep apnea most recently at the hospital. Reports that he has not had sleep study.    ETOH abuse- has not drank in 5 days.   Review of Systems See HPI  Past Medical History  Diagnosis Date  . Alcoholic pancreatitis     recurrent  . Diabetes mellitus, type II     New Onset 03/2010  . Hypertension   . 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 High cholesterol   . Sleep apnea   . GERD (gastroesophageal reflux disease)   . Depression     History   Social History  . Marital Status: Married    Spouse Name: N/A    Number of Children: 2  . 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 Smoking status: Never Smoker   . Smokeless tobacco: Never Used  . Alcohol Use: 12.0 oz/week    15 Cans of beer, 5 Shots of liquor per week     Comment: 08/04/2012 "maybe 15 beers/wk; I don't drink qd"   12/31/2012 "switched to mostly liquor, but trying to cut back"  . Drug Use: No  .  Sexual Activity: Yes   Other Topics Concern  . 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 Lumbar microdiscectomy  ~ 2004    Dr Danielle Dess    Family History  Problem Relation Age of Onset  . Diabetes Mother   . Lung cancer Brother     twin brother  . Colon cancer Neg Hx   . Pancreatic cancer Paternal Aunt     No Known Allergies  Current Outpatient Prescriptions on File Prior to Visit  Medication Sig Dispense Refill  . atorvastatin (LIPITOR) 10 MG tablet Take 1 tablet (10 mg total) by mouth daily.  90 tablet  0  . bisoprolol (ZEBETA) 10 MG tablet Take 1 tablet (10 mg total) by mouth daily.  90 tablet  1  . DULoxetine (CYMBALTA) 60 MG capsule Take 1 capsule (  60 mg total) by mouth daily.  30 capsule  0  . hydrOXYzine (ATARAX/VISTARIL) 25 MG tablet Take 1 tablet (25 mg total) by mouth every 4 (four) hours as needed for anxiety.  30 tablet  0  . olmesartan-hydrochlorothiazide (BENICAR HCT) 40-25 MG per tablet Take 1 tablet by mouth daily.  90 tablet  1  . pantoprazole (PROTONIX) 40 MG tablet Take 1 tablet (40 mg total) by mouth daily.  30 tablet  0  . QUEtiapine (SEROQUEL) 100 MG tablet Take 1 tablet (100 mg total) by mouth at bedtime.  30 tablet  0   No current facility-administered medications on file prior to visit.    BP 130/90  Pulse 101  Temp(Src) 97.6 F (36.4 C) (Oral)  Resp 16  Ht 5' 10.5" (1.791 m)  Wt 237 lb (107.502 kg)  BMI 33.51 kg/m2  SpO2 97%       Objective:   Physical Exam  Constitutional: He is oriented to person, place, and time. He appears well-developed and well-nourished. No distress.  Cardiovascular: Normal rate and regular rhythm.   No murmur heard. Pulmonary/Chest: Effort normal and breath sounds normal. No  respiratory distress. He has no wheezes. He has no rales. He exhibits no tenderness.  Neurological: He is alert and oriented to person, place, and time.  Skin: Skin is warm and dry.  Wound left inner wrist with some granulation tissue/drainage in center.  Mild erythema/induration surrounding.            Assessment & Plan:

## 2013-04-07 NOTE — Assessment & Plan Note (Signed)
Will order home sleep study

## 2013-04-07 NOTE — Progress Notes (Signed)
Pre visit review using our clinic review tool, if applicable. No additional management support is needed unless otherwise documented below in the visit note. 

## 2013-04-07 NOTE — Patient Instructions (Addendum)
Start Keflex (antibiotic) for your wrist wound. Keep wound clean and dry. Change dressing daily. Call if increased redness or if wound does not continue to heal. Restart seroquel at 1/2 tab (50mg ) once daily at bedtime. You will be contacted about your referral for sleep study and for MRI of your back. Start medrol dose pak for your back pain.  Follow up in 1 month.

## 2013-04-07 NOTE — Assessment & Plan Note (Signed)
Sutures removed today in the office. Wound was dressed.  Rx with keflex for possible early infection- pt instructed to follow up if symptoms worsen or if symptoms do not improve.

## 2013-04-09 ENCOUNTER — Other Ambulatory Visit: Payer: Self-pay | Admitting: Family

## 2013-04-09 DIAGNOSIS — M545 Low back pain, unspecified: Secondary | ICD-10-CM

## 2013-04-10 ENCOUNTER — Ambulatory Visit (HOSPITAL_BASED_OUTPATIENT_CLINIC_OR_DEPARTMENT_OTHER): Payer: 59

## 2013-04-13 ENCOUNTER — Telehealth: Payer: Self-pay | Admitting: Family

## 2013-04-13 ENCOUNTER — Ambulatory Visit (INDEPENDENT_AMBULATORY_CARE_PROVIDER_SITE_OTHER): Payer: 59

## 2013-04-13 DIAGNOSIS — M545 Low back pain: Secondary | ICD-10-CM

## 2013-04-13 DIAGNOSIS — M5126 Other intervertebral disc displacement, lumbar region: Secondary | ICD-10-CM

## 2013-04-13 NOTE — Telephone Encounter (Signed)
Pls let pt know that I reviewed his MRI and it notes shows bulging discs in lumbar spine, possible mild nerve compression. How is he feeling with medrol dose pak.  If not improved I will refer to neurosurgery.  Who has he seen in the past.

## 2013-04-13 NOTE — Telephone Encounter (Signed)
Left detailed message on cell# to call back re: below information.

## 2013-04-15 NOTE — Telephone Encounter (Signed)
PATIENT RETURNED YOUR CALL ABOUT SEEING A NEURO SURGEON AND MEDS FOR HIS BACK PLEASE CALL HIM

## 2013-04-19 NOTE — Telephone Encounter (Signed)
Notified pt. He states the dose pack worked great at first but the pain returned when the dose leveled off. States is in a lot of pain. Can't stand or sit for long periods of time and is agreeable to neurosurgeon referral.  He states he will call me back tomorrow with name of neurosurgeon that he wants to see. Also wants to know if a muscle relaxer would help his pain?  Please advise.

## 2013-04-20 MED ORDER — MELOXICAM 7.5 MG PO TABS: 7.5000 mg | ORAL_TABLET | Freq: Every day | ORAL | Status: DC

## 2013-04-20 NOTE — Telephone Encounter (Signed)
I would advise against muscle relaxer at this point.  Rx sent for meloxicam.  Will refer to Neurosurgery.  I checked and he has see Dr. Danielle Dess in the past.  I will arrange follow up with him.

## 2013-04-20 NOTE — Telephone Encounter (Signed)
Left detailed message re: below instructions on pt's voicemail. Pt stated last night that he did not want to return to his previous neurosurgery group.  Advised him to call us with name of neurosurgeon so we can initiate referral.

## 2013-04-29 ENCOUNTER — Other Ambulatory Visit: Payer: Self-pay | Admitting: Family

## 2013-05-03 MED ORDER — DULOXETINE HCL 60 MG PO CPEP: 60.0000 mg | ORAL_CAPSULE | Freq: Every day | ORAL | Status: DC

## 2013-05-03 NOTE — Telephone Encounter (Signed)
Please advise Mobic refill?  Last RX was done on 04-20-13 quantity 20 with 1 refill

## 2013-05-05 ENCOUNTER — Other Ambulatory Visit: Payer: Self-pay | Admitting: Family

## 2013-05-07 ENCOUNTER — Telehealth: Payer: Self-pay | Admitting: *Deleted

## 2013-05-07 MED ORDER — DULOXETINE HCL 60 MG PO CPEP: 60.0000 mg | ORAL_CAPSULE | Freq: Every day | ORAL | Status: DC

## 2013-05-07 NOTE — Telephone Encounter (Signed)
Refill sent. No samples available as rx has gone generic.  He could apply for pt assistance though.

## 2013-05-07 NOTE — Telephone Encounter (Signed)
Left detailed message for pt to call and let us know if he wants to proceed with pt assistance.

## 2013-05-07 NOTE — Telephone Encounter (Signed)
Received message from pt that he was scheduled to see [place] on Tuesday and needs a refill on his Cymbalta. States his insurance was cancelled and he does not have coverage at present. Would like samples of Cymbalta?  Please advise.

## 2013-05-10 NOTE — Telephone Encounter (Signed)
Pt left message on Friday at 4:36pm that he would like rx to go to [facility] as he thinks he can get it for $4.  Left detailed message on voicemail to call and confirm that cymbalta is the only medication he wants to pick up at [facility]. Also checked with [facility] and confirmed that they will do a price match on [facility] $4 meds and they may have special pricing for non-insured Rxs.  Left detailed message on voicemail to let us know how he wants to proceed.

## 2013-05-11 ENCOUNTER — Ambulatory Visit: Payer: Self-pay | Admitting: Family

## 2013-05-13 NOTE — Telephone Encounter (Signed)
Unable to afford cymbalta, got upset on the phone, started crying. Request to be changed to cheaper drug.

## 2013-05-14 MED ORDER — CITALOPRAM HYDROBROMIDE 20 MG PO TABS: 20.0000 mg | ORAL_TABLET | Freq: Every day | ORAL | Status: DC

## 2013-05-14 NOTE — Telephone Encounter (Signed)
Spoke to pt. He reports feeling better. Will rx with celexa as he has tolerated in the past.  Advised him to schedule follow up in the office in the next 1 month.

## 2013-06-11 ENCOUNTER — Emergency Department (HOSPITAL_BASED_OUTPATIENT_CLINIC_OR_DEPARTMENT_OTHER)
Admission: EM | Admit: 2013-06-11 | Discharge: 2013-06-11 | Disposition: A | Discharge status: Home / Self Care | Payer: Self-pay | Attending: Emergency Medicine | Admitting: Emergency Medicine

## 2013-06-11 ENCOUNTER — Emergency Department (HOSPITAL_BASED_OUTPATIENT_CLINIC_OR_DEPARTMENT_OTHER): Payer: Self-pay

## 2013-06-11 ENCOUNTER — Encounter (HOSPITAL_BASED_OUTPATIENT_CLINIC_OR_DEPARTMENT_OTHER): Payer: Self-pay | Admitting: Emergency Medicine

## 2013-06-11 DIAGNOSIS — E876 Hypokalemia: Secondary | ICD-10-CM | POA: Insufficient documentation

## 2013-06-11 DIAGNOSIS — E7800 Pure hypercholesterolemia, unspecified: Secondary | ICD-10-CM | POA: Insufficient documentation

## 2013-06-11 DIAGNOSIS — Z791 Long term (current) use of non-steroidal anti-inflammatories (NSAID): Secondary | ICD-10-CM | POA: Insufficient documentation

## 2013-06-11 DIAGNOSIS — E119 Type 2 diabetes mellitus without complications: Secondary | ICD-10-CM | POA: Insufficient documentation

## 2013-06-11 DIAGNOSIS — K859 Acute pancreatitis without necrosis or infection, unspecified: Secondary | ICD-10-CM | POA: Insufficient documentation

## 2013-06-11 DIAGNOSIS — F329 Major depressive disorder, single episode, unspecified: Secondary | ICD-10-CM | POA: Insufficient documentation

## 2013-06-11 DIAGNOSIS — Z8739 Personal history of other diseases of the musculoskeletal system and connective tissue: Secondary | ICD-10-CM | POA: Insufficient documentation

## 2013-06-11 DIAGNOSIS — F3289 Other specified depressive episodes: Secondary | ICD-10-CM | POA: Insufficient documentation

## 2013-06-11 DIAGNOSIS — N509 Disorder of male genital organs, unspecified: Secondary | ICD-10-CM | POA: Insufficient documentation

## 2013-06-11 DIAGNOSIS — Z79899 Other long term (current) drug therapy: Secondary | ICD-10-CM | POA: Insufficient documentation

## 2013-06-11 DIAGNOSIS — I10 Essential (primary) hypertension: Secondary | ICD-10-CM | POA: Insufficient documentation

## 2013-06-11 DIAGNOSIS — K219 Gastro-esophageal reflux disease without esophagitis: Secondary | ICD-10-CM | POA: Insufficient documentation

## 2013-06-11 LAB — CBC WITH DIFFERENTIAL/PLATELET
Basophils Absolute: 0 10*3/uL (ref 0.0–0.1)
Basophils Relative: 0 % (ref 0–1)
Eosinophils Absolute: 0.2 10*3/uL (ref 0.0–0.7)
Eosinophils Relative: 2 % (ref 0–5)
HEMATOCRIT: 41.4 % (ref 39.0–52.0)
Hemoglobin: 14.9 g/dL (ref 13.0–17.0)
LYMPHS PCT: 16 % (ref 12–46)
Lymphs Abs: 1.4 10*3/uL (ref 0.7–4.0)
MCH: 30.5 pg (ref 26.0–34.0)
MCHC: 36 g/dL (ref 30.0–36.0)
MCV: 84.8 fL (ref 78.0–100.0)
MONOS PCT: 8 % (ref 3–12)
Monocytes Absolute: 0.7 10*3/uL (ref 0.1–1.0)
NEUTROS ABS: 6.6 10*3/uL (ref 1.7–7.7)
Neutrophils Relative %: 74 % (ref 43–77)
Platelets: 279 10*3/uL (ref 150–400)
RBC: 4.88 MIL/uL (ref 4.22–5.81)
RDW: 12.7 % (ref 11.5–15.5)
WBC: 8.9 10*3/uL (ref 4.0–10.5)

## 2013-06-11 LAB — URINALYSIS, ROUTINE W REFLEX MICROSCOPIC
BILIRUBIN URINE: NEGATIVE
Glucose, UA: >1000 mg/dL — AB
Hgb urine dipstick: NEGATIVE
KETONES UR: 40 mg/dL — AB
LEUKOCYTES UA: NEGATIVE
NITRITE: NEGATIVE
PROTEIN: 30 mg/dL — AB
SPECIFIC GRAVITY, URINE: 1.028 (ref 1.005–1.030)
Urobilinogen, UA: 0.2 mg/dL (ref 0.0–1.0)
pH: 6.5 (ref 5.0–8.0)

## 2013-06-11 LAB — CG4 I-STAT (LACTIC ACID): LACTIC ACID, VENOUS: 1.95 mmol/L (ref 0.5–2.2)

## 2013-06-11 LAB — COMPREHENSIVE METABOLIC PANEL
ALK PHOS: 97 U/L (ref 39–117)
ALT: 36 U/L (ref 0–53)
AST: 29 U/L (ref 0–37)
Albumin: 3.9 g/dL (ref 3.5–5.2)
BILIRUBIN TOTAL: 1.5 mg/dL — AB (ref 0.3–1.2)
BUN: 6 mg/dL (ref 6–23)
CHLORIDE: 95 meq/L — AB (ref 96–112)
CO2: 25 mEq/L (ref 19–32)
Calcium: 9.3 mg/dL (ref 8.4–10.5)
Creatinine, Ser: 0.8 mg/dL (ref 0.50–1.35)
GFR calc Af Amer: >90 mL/min (ref >90)
GFR calc non Af Amer: >90 mL/min (ref >90)
Glucose, Bld: 206 mg/dL — ABNORMAL HIGH (ref 70–99)
POTASSIUM: 2.9 meq/L — AB (ref 3.7–5.3)
Sodium: 137 mEq/L (ref 137–147)
Total Protein: 7.4 g/dL (ref 6.0–8.3)

## 2013-06-11 LAB — LIPASE, BLOOD: LIPASE: 574 U/L — AB (ref 11–59)

## 2013-06-11 LAB — URINE MICROSCOPIC-ADD ON

## 2013-06-11 MED ORDER — ONDANSETRON HCL 4 MG/2ML IJ SOLN
4.0000 mg | Freq: Once | INTRAMUSCULAR | Status: AC
  Administered 2013-06-11: 4 mg via INTRAVENOUS
  Filled 2013-06-11: qty 2

## 2013-06-11 MED ORDER — POTASSIUM CHLORIDE CRYS ER 20 MEQ PO TBCR: 20.0000 meq | EXTENDED_RELEASE_TABLET | Freq: Two times a day (BID) | ORAL | Status: DC

## 2013-06-11 MED ORDER — SODIUM CHLORIDE 0.9 % IV SOLN
INTRAVENOUS | Status: DC
  Administered 2013-06-11: 03:00:00 via INTRAVENOUS

## 2013-06-11 MED ORDER — IOHEXOL 300 MG/ML  SOLN
100.0000 mL | Freq: Once | INTRAMUSCULAR | Status: AC | PRN
  Administered 2013-06-11: 100 mL via INTRAVENOUS

## 2013-06-11 MED ORDER — IOHEXOL 300 MG/ML  SOLN
50.0000 mL | Freq: Once | INTRAMUSCULAR | Status: AC | PRN
  Administered 2013-06-11: 50 mL via ORAL

## 2013-06-11 MED ORDER — HYDROMORPHONE HCL 4 MG PO TABS: 4.0000 mg | ORAL_TABLET | ORAL | Status: DC | PRN

## 2013-06-11 MED ORDER — HYDROMORPHONE HCL PF 1 MG/ML IJ SOLN
1.0000 mg | Freq: Once | INTRAMUSCULAR | Status: AC | PRN
  Administered 2013-06-11: 1 mg via INTRAVENOUS
  Filled 2013-06-11: qty 1

## 2013-06-11 MED ORDER — HYDROMORPHONE HCL PF 1 MG/ML IJ SOLN
1.0000 mg | Freq: Once | INTRAMUSCULAR | Status: AC
  Administered 2013-06-11: 1 mg via INTRAVENOUS
  Filled 2013-06-11: qty 1

## 2013-06-11 MED ORDER — POTASSIUM CHLORIDE 10 MEQ/100ML IV SOLN
10.0000 meq | INTRAVENOUS | Status: DC
  Administered 2013-06-11: 10 meq via INTRAVENOUS
  Filled 2013-06-11: qty 100

## 2013-06-11 NOTE — ED Notes (Signed)
Pt c/o lower abd pain with testicular pain x2 days states hx of pancreatitis feels similar, denies n/v/d; states burping and hasn't ate for 2days

## 2013-06-11 NOTE — Discharge Instructions (Signed)
Acute Pancreatitis °Acute pancreatitis is a disease in which the pancreas becomes suddenly inflamed. The pancreas is a large gland located behind your stomach. The pancreas produces enzymes that help digest food. The pancreas also releases the hormones glucagon and insulin that help regulate blood sugar. Damage to the pancreas occurs when the digestive enzymes from the pancreas are activated and begin attacking the pancreas before being released into the intestine. Most acute attacks last a couple of days and can cause serious complications. Some people become dehydrated and develop low blood pressure. In severe cases, bleeding into the pancreas can lead to shock and can be life-threatening. The lungs, heart, and kidneys may fail. °CAUSES  °Pancreatitis can happen to anyone. In some cases, the cause is unknown. Most cases are caused by: °· Alcohol abuse. °· Gallstones. °Other less common causes are: °· Certain medicines. °· Exposure to certain chemicals. °· Infection. °· Damage caused by an accident (trauma). °· Abdominal surgery. °SYMPTOMS  °· Pain in the upper abdomen that may radiate to the back. °· Tenderness and swelling of the abdomen. °· Nausea and vomiting. °DIAGNOSIS  °Your caregiver will perform a physical exam. Blood and stool tests may be done to confirm the diagnosis. Imaging tests may also be done, such as X-rays, CT scans, or an ultrasound of the abdomen. °TREATMENT  °Treatment usually requires a stay in the hospital. Treatment may include: °· Pain medicine. °· Fluid replacement through an intravenous line (IV). °· Placing a tube in the stomach to remove stomach contents and control vomiting. °· Not eating for 3 or 4 days. This gives your pancreas a rest, because enzymes are not being produced that can cause further damage. °· Antibiotic medicines if your condition is caused by an infection. °· Surgery of the pancreas or gallbladder. °HOME CARE INSTRUCTIONS  °· Follow the diet advised by your  caregiver. This may involve avoiding alcohol and decreasing the amount of fat in your diet. °· Eat smaller, more frequent meals. This reduces the amount of digestive juices the pancreas produces. °· Drink enough fluids to keep your urine clear or pale yellow. °· Only take over-the-counter or prescription medicines as directed by your caregiver. °· Avoid drinking alcohol if it caused your condition. °· Do not smoke. °· Get plenty of rest. °· Check your blood sugar at home as directed by your caregiver. °· Keep all follow-up appointments as directed by your caregiver. °SEEK MEDICAL CARE IF:  °· You do not recover as quickly as expected. °· You develop new or worsening symptoms. °· You have persistent pain, weakness, or nausea. °· You recover and then have another episode of pain. °SEEK IMMEDIATE MEDICAL CARE IF:  °· You are unable to eat or keep fluids down. °· Your pain becomes severe. °· You have a fever or persistent symptoms for more than 2 to 3 days. °· You have a fever and your symptoms suddenly get worse. °· Your skin or the white part of your eyes turn yellow (jaundice). °· You develop vomiting. °· You feel dizzy, or you faint. °· Your blood sugar is high (over 300 mg/dL). °MAKE SURE YOU:  °· Understand these instructions. °· Will watch your condition. °· Will get help right away if you are not doing well or get worse. °Document Released: 04/15/2005 Document Revised: 10/15/2011 Document Reviewed: 07/25/2011 °Exit[employer]® Patient Information ©2014 [employer]. ° °

## 2013-06-11 NOTE — ED Provider Notes (Signed)
CSN: [id number]     Arrival date & time 06/11/13  0250 History   First MD Initiated Contact with Patient 06/11/13 0305     Chief Complaint  Patient presents with  . Abdominal Pain     (Consider location/radiation/quality/duration/timing/severity/associated sxs/prior Treatment) HPI This is a 38 year old male with history of alcoholic pancreatitis. He is here with a two-day history of abdominal pain. It felt like pancreatitis at first but it has localized to the lower abdomen rather than the epigastrium where his pancreatitis usually affects him. He had been treating himself with acetaminophen and ibuprofen but the pain became severe and he came to the ED. The pain is worse with deep breathing, movement or palpation. He has also felt pain in his testicles and has had difficulty finding a comfortable position. He has had frequent belching. He denies nausea, vomiting, diarrhea or constipation. He last moved his bowels yesterday which was of normal consistency. He denies fever, chills, shortness of breath, chest pain, dysuria or hematuria. He states his umbilicus, which is usually "an outy and firm", is now soft. He has not eaten in two days. He last consumed alcohol about 4 days ago.  Past Medical History  Diagnosis Date  . Alcoholic pancreatitis     recurrent  . Diabetes mellitus, type II     New Onset 03/2010  . Hypertension   . 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 High cholesterol   . Sleep apnea   . GERD (gastroesophageal reflux disease)   . Depression    Past Surgical History  Procedure Laterality Date  . Lumbar microdiscectomy  ~ 2004    Dr Ellene Route   Family History  Problem Relation Age of Onset  . Diabetes Mother   . Lung cancer Brother     twin brother  . Colon cancer Neg Hx   . Pancreatic cancer Paternal Aunt    History  Substance Use Topics  . Smoking status: Never Smoker   . Smokeless tobacco: Never Used  . Alcohol  Use: 12.0 oz/week    15 Cans of beer, 5 Shots of liquor per week     Comment: 08/04/2012 "maybe 15 beers/wk; I don't drink qd"   12/31/2012 "switched to mostly liquor, but trying to cut back"    Review of Systems  All other systems reviewed and are negative.   Allergies  Review of patient's allergies indicates no known allergies.  Home Medications   Current Outpatient Rx  Name  Route  Sig  Dispense  Refill  . atorvastatin (LIPITOR) 10 MG tablet      TAKE 1 TABLET (10 MG TOTAL) BY MOUTH DAILY.   90 tablet   1   . BENICAR HCT 40-25 MG per tablet      TAKE 1 TABLET BY MOUTH DAILY.   90 tablet   1   . bisoprolol (ZEBETA) 10 MG tablet      TAKE 1 TABLET (10 MG TOTAL) BY MOUTH DAILY.   90 tablet   1   . citalopram (CELEXA) 20 MG tablet   Oral   Take 1 tablet (20 mg total) by mouth daily.   30 tablet   0   . DULoxetine (CYMBALTA) 60 MG capsule   Oral   Take 1 capsule (60 mg total) by mouth daily.   30 capsule   0     PATIENT NEEDS OFFICE VISIT FOR FURTHER REFILLS.   Marland Kitchen  hydrOXYzine (ATARAX/VISTARIL) 25 MG tablet   Oral   Take 1 tablet (25 mg total) by mouth every 4 (four) hours as needed for anxiety.   30 tablet   0   . meloxicam (MOBIC) 7.5 MG tablet   Oral   Take 1 tablet (7.5 mg total) by mouth daily.   20 tablet   0   . metFORMIN (GLUCOPHAGE) 500 MG tablet      Take 2 tablets twice a day.         . pantoprazole (PROTONIX) 40 MG tablet   Oral   Take 1 tablet (40 mg total) by mouth daily.   30 tablet   0    BP 167/109  Pulse 98  Temp(Src) 97.9 F (36.6 C) (Oral)  Resp 16  Ht 5\' 11"  (1.803 m)  Wt 220 lb (99.791 kg)  BMI 30.70 kg/m2  SpO2 100%  Physical Exam General: Well-developed, well-nourished male in no acute distress; appearance consistent with age of record HENT: normocephalic; atraumatic Eyes: pupils equal, round and reactive to light; extraocular muscles intact Neck: supple Heart: regular rate and rhythm Lungs: clear to auscultation  bilaterally Abdomen: soft; nondistended; severe lower abdominal tenderness; no masses or hepatosplenomegaly; bowel sounds present GU: Tanner 4 male, uncircumcised; testicles without tenderness or enlargement, no hernia palpated Extremities: No deformity; full range of motion; pulses normal Neurologic: Awake, alert and oriented; motor function intact in all extremities and symmetric; no facial droop Skin: Warm and dry Psychiatric: Tearful     ED Course  Procedures (including critical care time)     MDM   Nursing notes and vitals signs, including pulse oximetry, reviewed.  Summary of this visit's results, reviewed by myself:  Labs:  Results for orders placed during the hospital encounter of 06/11/13 (from the past 24 hour(s))  COMPREHENSIVE METABOLIC PANEL     Status: Abnormal   Collection Time    06/11/13  3:10 AM      Result Value Ref Range   Sodium 137  137 - 147 mEq/L   Potassium 2.9 (*) 3.7 - 5.3 mEq/L   Chloride 95 (*) 96 - 112 mEq/L   CO2 25  19 - 32 mEq/L   Glucose, Bld 206 (*) 70 - 99 mg/dL   BUN 6  6 - 23 mg/dL   Creatinine, Ser 0.80  0.50 - 1.35 mg/dL   Calcium 9.3  8.4 - 10.5 mg/dL   Total Protein 7.4  6.0 - 8.3 g/dL   Albumin 3.9  3.5 - 5.2 g/dL   AST 29  0 - 37 U/L   ALT 36  0 - 53 U/L   Alkaline Phosphatase 97  39 - 117 U/L   Total Bilirubin 1.5 (*) 0.3 - 1.2 mg/dL   GFR calc non Af Amer >90  >90 mL/min   GFR calc Af Amer >90  >90 mL/min  LIPASE, BLOOD     Status: Abnormal   Collection Time    06/11/13  3:10 AM      Result Value Ref Range   Lipase 574 (*) 11 - 59 U/L  CBC WITH DIFFERENTIAL     Status: None   Collection Time    06/11/13  3:10 AM      Result Value Ref Range   WBC 8.9  4.0 - 10.5 K/uL   RBC 4.88  4.22 - 5.81 MIL/uL   Hemoglobin 14.9  13.0 - 17.0 g/dL   HCT 41.4  39.0 - 52.0 %   MCV 84.8  78.0 - 100.0 fL   MCH 30.5  26.0 - 34.0 pg   MCHC 36.0  30.0 - 36.0 g/dL   RDW 12.7  11.5 - 15.5 %   Platelets 279  150 - 400 K/uL    Neutrophils Relative % 74  43 - 77 %   Neutro Abs 6.6  1.7 - 7.7 K/uL   Lymphocytes Relative 16  12 - 46 %   Lymphs Abs 1.4  0.7 - 4.0 K/uL   Monocytes Relative 8  3 - 12 %   Monocytes Absolute 0.7  0.1 - 1.0 K/uL   Eosinophils Relative 2  0 - 5 %   Eosinophils Absolute 0.2  0.0 - 0.7 K/uL   Basophils Relative 0  0 - 1 %   Basophils Absolute 0.0  0.0 - 0.1 K/uL  URINALYSIS, ROUTINE W REFLEX MICROSCOPIC     Status: Abnormal   Collection Time    06/11/13  3:25 AM      Result Value Ref Range   Color, Urine YELLOW  YELLOW   APPearance CLEAR  CLEAR   Specific Gravity, Urine 1.028  1.005 - 1.030   pH 6.5  5.0 - 8.0   Glucose, UA >1000 (*) NEGATIVE mg/dL   Hgb urine dipstick NEGATIVE  NEGATIVE   Bilirubin Urine NEGATIVE  NEGATIVE   Ketones, ur 40 (*) NEGATIVE mg/dL   Protein, ur 30 (*) NEGATIVE mg/dL   Urobilinogen, UA 0.2  0.0 - 1.0 mg/dL   Nitrite NEGATIVE  NEGATIVE   Leukocytes, UA NEGATIVE  NEGATIVE  URINE MICROSCOPIC-ADD ON     Status: None   Collection Time    06/11/13  3:25 AM      Result Value Ref Range   Squamous Epithelial / LPF RARE  RARE   WBC, UA 0-2  <3 WBC/hpf   RBC / HPF 0-2  <3 RBC/hpf   Urine-Other MUCOUS PRESENT     03:55 Lactate 1.95  Imaging Studies: Ct Abdomen Pelvis W Contrast  06/11/2013   CLINICAL DATA:  Abdominal pain.  EXAM: CT ABDOMEN AND PELVIS WITH CONTRAST  TECHNIQUE: Multidetector CT imaging of the abdomen and pelvis was performed using the standard protocol following bolus administration of intravenous contrast.  CONTRAST:  74mL OMNIPAQUE IOHEXOL 300 MG/ML SOLN, 151mL OMNIPAQUE IOHEXOL 300 MG/ML SOLN  COMPARISON:  CT ABD/PELVIS W CM dated 10/03/2012; CT ABD/PELVIS W CM dated 05/18/2012  FINDINGS: Lung Bases: Dependent atelectasis at the lung bases.  Liver: Hepatomegaly and fatty liver. Small amount of perihepatic ascites. Liver span is 21 cm.  Spleen:  Normal.  Gallbladder: Distended. No calcified stones. No definite mural thickening on CT.  Common bile  duct:  Normal.  Pancreas: Inflammatory changes around the pancreas compatible with acute pancreatitis. No vascular complications or pseudocyst.  Adrenal glands:  Normal.  Kidneys: Normal enhancement. Both ureters appear within normal limits.  Stomach:  Normal.  Small bowel: Normal. No obstruction. Stranding in the mesentery extending from the root associated with pancreatitis.  Colon: Reactive inflammatory changes of the colon secondary to pancreatitis. Moderate stool burden. Colonic diverticulosis.  Pelvic Genitourinary: Urinary bladder appears normal. Small amount of ascites.  Bones:  Negative for AVN.  No aggressive osseous lesions.  Vasculature: Normal.  Body Wall: Fat containing periumbilical hernia.  IMPRESSION: 1. Uncomplicated acute pancreatitis. 2. Hepatomegaly and hepatosteatosis. 3. Small amount of ascites.   Electronically Signed   By: Dereck Ligas M.D.   On: 06/11/2013 05:03   5:10 AM The patient states he  usually does not get admitted for his episodes of acute pancreatitis. He is usually treated as an outpatient with analgesics and bowel rest.       Wynetta Fines, MD 06/11/13 [phone]

## 2013-06-14 ENCOUNTER — Telehealth: Payer: Self-pay | Admitting: Family

## 2013-06-14 NOTE — Telephone Encounter (Signed)
Went to the emergency room on the weekend.  Was told he has pancreatitis.  They gave him dilauid till today.  He needs more meds.

## 2013-06-14 NOTE — Telephone Encounter (Signed)
Cannot prescribe these meds over the phone, if he is still in severe pain will need to go to ED or urgent care and he will need an appt soon to follow up. Watch the fatty, spicy and acidic foods, only eat bland foods with simple carbs.

## 2013-06-14 NOTE — Telephone Encounter (Signed)
Please advise 

## 2013-06-14 NOTE — Telephone Encounter (Signed)
Left a detailed message on v/m

## 2013-08-05 ENCOUNTER — Other Ambulatory Visit: Payer: Self-pay

## 2013-08-13 ENCOUNTER — Emergency Department (HOSPITAL_BASED_OUTPATIENT_CLINIC_OR_DEPARTMENT_OTHER)
Admission: EM | Admit: 2013-08-13 | Discharge: 2013-08-14 | Disposition: A | Discharge status: Home / Self Care | Payer: Self-pay | Attending: Emergency Medicine | Admitting: Emergency Medicine

## 2013-08-13 ENCOUNTER — Encounter (HOSPITAL_BASED_OUTPATIENT_CLINIC_OR_DEPARTMENT_OTHER): Payer: Self-pay | Admitting: Emergency Medicine

## 2013-08-13 DIAGNOSIS — F3289 Other specified depressive episodes: Secondary | ICD-10-CM | POA: Insufficient documentation

## 2013-08-13 DIAGNOSIS — F329 Major depressive disorder, single episode, unspecified: Secondary | ICD-10-CM | POA: Insufficient documentation

## 2013-08-13 DIAGNOSIS — E7800 Pure hypercholesterolemia, unspecified: Secondary | ICD-10-CM | POA: Insufficient documentation

## 2013-08-13 DIAGNOSIS — Z791 Long term (current) use of non-steroidal anti-inflammatories (NSAID): Secondary | ICD-10-CM | POA: Insufficient documentation

## 2013-08-13 DIAGNOSIS — I10 Essential (primary) hypertension: Secondary | ICD-10-CM | POA: Insufficient documentation

## 2013-08-13 DIAGNOSIS — K859 Acute pancreatitis without necrosis or infection, unspecified: Secondary | ICD-10-CM | POA: Insufficient documentation

## 2013-08-13 DIAGNOSIS — E119 Type 2 diabetes mellitus without complications: Secondary | ICD-10-CM | POA: Insufficient documentation

## 2013-08-13 DIAGNOSIS — Z79899 Other long term (current) drug therapy: Secondary | ICD-10-CM | POA: Insufficient documentation

## 2013-08-13 DIAGNOSIS — K219 Gastro-esophageal reflux disease without esophagitis: Secondary | ICD-10-CM | POA: Insufficient documentation

## 2013-08-13 LAB — CBC
HCT: 40.7 % (ref 39.0–52.0)
HEMOGLOBIN: 14.8 g/dL (ref 13.0–17.0)
MCH: 29.8 pg (ref 26.0–34.0)
MCHC: 36.4 g/dL — ABNORMAL HIGH (ref 30.0–36.0)
MCV: 81.9 fL (ref 78.0–100.0)
PLATELETS: 254 10*3/uL (ref 150–400)
RBC: 4.97 MIL/uL (ref 4.22–5.81)
RDW: 12.3 % (ref 11.5–15.5)
WBC: 7.2 10*3/uL (ref 4.0–10.5)

## 2013-08-13 LAB — COMPREHENSIVE METABOLIC PANEL
ALK PHOS: 80 U/L (ref 39–117)
ALT: 39 U/L (ref 0–53)
AST: 33 U/L (ref 0–37)
Albumin: 3.9 g/dL (ref 3.5–5.2)
BILIRUBIN TOTAL: 0.6 mg/dL (ref 0.3–1.2)
BUN: 6 mg/dL (ref 6–23)
CHLORIDE: 97 meq/L (ref 96–112)
CO2: 24 meq/L (ref 19–32)
Calcium: 9.6 mg/dL (ref 8.4–10.5)
Creatinine, Ser: 0.7 mg/dL (ref 0.50–1.35)
GLUCOSE: 240 mg/dL — AB (ref 70–99)
POTASSIUM: 3.5 meq/L — AB (ref 3.7–5.3)
SODIUM: 136 meq/L — AB (ref 137–147)
Total Protein: 7.1 g/dL (ref 6.0–8.3)

## 2013-08-13 LAB — LIPASE, BLOOD: Lipase: 293 U/L — ABNORMAL HIGH (ref 11–59)

## 2013-08-13 MED ORDER — ONDANSETRON HCL 4 MG/2ML IJ SOLN
4.0000 mg | Freq: Once | INTRAMUSCULAR | Status: AC
  Administered 2013-08-13: 4 mg via INTRAVENOUS
  Filled 2013-08-13: qty 2

## 2013-08-13 MED ORDER — SODIUM CHLORIDE 0.9 % IV BOLUS (SEPSIS)
1000.0000 mL | Freq: Once | INTRAVENOUS | Status: AC
  Administered 2013-08-13: 1000 mL via INTRAVENOUS

## 2013-08-13 MED ORDER — HYDROMORPHONE HCL PF 1 MG/ML IJ SOLN
1.0000 mg | Freq: Once | INTRAMUSCULAR | Status: AC
  Administered 2013-08-13: 1 mg via INTRAVENOUS
  Filled 2013-08-13: qty 1

## 2013-08-13 NOTE — ED Notes (Signed)
Pt states upper RQ pain and upper mid back pain for past 2 days.  Worsening over past 2 days.  Patient states has taken 600mg  q3hours for pain with minimal relief.  Pt states dilaudid normally helps with pain during flare ups and that he cannot take tylenol due to liver damage.

## 2013-08-13 NOTE — ED Notes (Signed)
C/o abd pain "pancreatitis" x 2 days

## 2013-08-13 NOTE — ED Provider Notes (Signed)
CSN: [id number]     Arrival date & time 08/13/13  2048 History  This chart was scribed for Osvaldo Shipper, MD by Zettie Pho, ED Scribe. This patient was seen in room MH12/MH12 and the patient's care was started at 9:15 PM.    Chief Complaint  Patient presents with  . Abdominal Pain   Patient is a 38 y.o. male presenting with abdominal pain. The history is provided by the patient. No language interpreter was used.  Abdominal Pain Pain location:  RLQ Pain radiates to:  Back Pain severity:  Moderate Onset quality:  Gradual Timing:  Intermittent Progression:  Worsening Chronicity:  Recurrent Context: alcohol use   Relieved by:  Nothing Worsened by:  Nothing tried Ineffective treatments:  None tried Associated symptoms: no chills, no fever and no vomiting   Risk factors: alcohol abuse    HPI Comments: Willie [name] is a 38 y.o. Male with a history of alcoholic pancreatitis and GERD who presents to the Emergency Department complaining of an intermittent pain to the generalized abdomen, worse over the RLQ, that radiates to the back onset 2 days ago that he states has been progressively worsening. Patient reports that his current pain feels similar to his past pancreatitis. He denies history of prior surgeries or hospital admissions related to his pancreatitis. Patient reports that his last drink was 3 days ago (a few shots of liquor and a few beers per patient). He denies fever, chills, leg swelling, emesis. Patient also has a history of type II DM, HTN, hypercholesterolemia.   Past Medical History  Diagnosis Date  . Alcoholic pancreatitis     recurrent  . Diabetes mellitus, type II     New Onset 03/2010  . Hypertension   . 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 High cholesterol   . Sleep apnea   . GERD (gastroesophageal reflux disease)   . Depression    Past Surgical History  Procedure Laterality Date  . Lumbar  microdiscectomy  ~ 2004    Dr Ellene Route   Family History  Problem Relation Age of Onset  . Diabetes Mother   . Lung cancer Brother     twin brother  . Colon cancer Neg Hx   . Pancreatic cancer Paternal Aunt    History  Substance Use Topics  . Smoking status: Never Smoker   . Smokeless tobacco: Never Used  . Alcohol Use: Yes    Review of Systems  Constitutional: Negative for fever and chills.  Cardiovascular: Negative for leg swelling.  Gastrointestinal: Positive for abdominal pain. Negative for vomiting.  All other systems reviewed and are negative.     Allergies  Review of patient's allergies indicates no known allergies.  Home Medications   Prior to Admission medications   Medication Sig Start Date End Date Taking? Authorizing Provider  atorvastatin (LIPITOR) 10 MG tablet TAKE 1 TABLET (10 MG TOTAL) BY MOUTH DAILY. 05/05/13   Debbrah Alar, NP  BENICAR HCT 40-25 MG per tablet TAKE 1 TABLET BY MOUTH DAILY. 05/05/13   Debbrah Alar, NP  bisoprolol (ZEBETA) 10 MG tablet TAKE 1 TABLET (10 MG TOTAL) BY MOUTH DAILY. 05/05/13   Debbrah Alar, NP  citalopram (CELEXA) 20 MG tablet Take 1 tablet (20 mg total) by mouth daily. 05/14/13   Debbrah Alar, NP  DULoxetine (CYMBALTA) 60 MG capsule Take 1 capsule (60 mg total) by mouth daily. 05/07/13   Debbrah Alar, NP  HYDROmorphone (DILAUDID) 4 MG tablet Take 1 tablet (4 mg total) by mouth every 4 (four) hours as needed for severe pain. 06/11/13   Karen Chafe Molpus, MD  hydrOXYzine (ATARAX/VISTARIL) 25 MG tablet Take 1 tablet (25 mg total) by mouth every 4 (four) hours as needed for anxiety. 02/15/13   Waylan Boga, NP  meloxicam (MOBIC) 7.5 MG tablet Take 1 tablet (7.5 mg total) by mouth daily. 04/20/13   Debbrah Alar, NP  metFORMIN (GLUCOPHAGE) 500 MG tablet Take 2 tablets twice a day.    Historical Provider, MD  pantoprazole (PROTONIX) 40 MG tablet Take 1 tablet (40 mg total) by mouth daily. 02/15/13   Waylan Boga, NP   potassium chloride SA (K-DUR,KLOR-CON) 20 MEQ tablet Take 1 tablet (20 mEq total) by mouth 2 (two) times daily. 06/11/13   Karen Chafe Molpus, MD   Triage Vitals: BP 151/112  Pulse 93  Temp(Src) 98.6 F (37 C) (Oral)  Resp 20  Ht 5\' 11"  (1.803 m)  Wt 209 lb (94.802 kg)  BMI 29.16 kg/m2  SpO2 100%  Physical Exam  Nursing note and vitals reviewed. Constitutional: He is oriented to person, place, and time. He appears well-developed and well-nourished. No distress.  HENT:  Head: Normocephalic and atraumatic.  Eyes: Conjunctivae are normal.  Neck: Normal range of motion. Neck supple.  Cardiovascular: Normal rate, regular rhythm and normal heart sounds.   Pulmonary/Chest: Effort normal and breath sounds normal. No respiratory distress.  Abdominal: Soft. Bowel sounds are normal. He exhibits no distension. There is tenderness. There is no rebound and no guarding.  Tenderness to palpation to the epigastric region and right side of the abdomen.   Musculoskeletal: Normal range of motion.  Neurological: He is alert and oriented to person, place, and time.  Skin: Skin is warm and dry.  Psychiatric: He has a normal mood and affect. His behavior is normal.    ED Course  Procedures (including critical care time)  DIAGNOSTIC STUDIES: Oxygen Saturation is 100% on room air, normal by my interpretation.    COORDINATION OF CARE: 9:19 PM- Ordered lipase, CBC, CMP. Ordered IV fluids, Zofran, and Dilaudid to manage symptoms. Discussed treatment plan with patient at bedside and patient verbalized agreement.     Labs Review Labs Reviewed  CBC - Abnormal; Notable for the following:    MCHC 36.4 (*)    All other components within normal limits  COMPREHENSIVE METABOLIC PANEL - Abnormal; Notable for the following:    Sodium 136 (*)    Potassium 3.5 (*)    Glucose, Bld 240 (*)    All other components within normal limits  LIPASE, BLOOD - Abnormal; Notable for the following:    Lipase 293 (*)    All  other components within normal limits    Imaging Review No results found.   EKG Interpretation None      MDM   Final diagnoses:  Pancreatitis    87M with hx of pancreatitis. Still drinking. Symptoms for past 2 days. No fevers. Similar to prior pancreatitis pain. Here vitals stable. Exam with mid abdominal tenderness. No ecchymosis of flanks.  Labs with elevated lipase - 293 - had elevations prior of 570s, mid 400s. Patient feeling better after fluids, tolerating PO. Stable for discharge. Counseled to quit drinking.   I personally performed the services described in this documentation, which was scribed in my presence. The recorded information has been reviewed and is accurate.      Osvaldo Shipper, MD 08/14/13 [phone]

## 2013-08-14 ENCOUNTER — Telehealth: Payer: Self-pay | Admitting: Family

## 2013-08-14 MED ORDER — OXYCODONE-ACETAMINOPHEN 5-325 MG PO TABS: 1.0000 | ORAL_TABLET | Freq: Four times a day (QID) | ORAL | Status: DC | PRN

## 2013-08-14 NOTE — Discharge Instructions (Signed)
Acute Pancreatitis °Acute pancreatitis is a disease in which the pancreas becomes suddenly inflamed. The pancreas is a large gland located behind your stomach. The pancreas produces enzymes that help digest food. The pancreas also releases the hormones glucagon and insulin that help regulate blood sugar. Damage to the pancreas occurs when the digestive enzymes from the pancreas are activated and begin attacking the pancreas before being released into the intestine. Most acute attacks last a couple of days and can cause serious complications. Some people become dehydrated and develop low blood pressure. In severe cases, bleeding into the pancreas can lead to shock and can be life-threatening. The lungs, heart, and kidneys may fail. °CAUSES  °Pancreatitis can happen to anyone. In some cases, the cause is unknown. Most cases are caused by: °· Alcohol abuse. °· Gallstones. °Other less common causes are: °· Certain medicines. °· Exposure to certain chemicals. °· Infection. °· Damage caused by an accident (trauma). °· Abdominal surgery. °SYMPTOMS  °· Pain in the upper abdomen that may radiate to the back. °· Tenderness and swelling of the abdomen. °· Nausea and vomiting. °DIAGNOSIS  °Your caregiver will perform a physical exam. Blood and stool tests may be done to confirm the diagnosis. Imaging tests may also be done, such as X-rays, CT scans, or an ultrasound of the abdomen. °TREATMENT  °Treatment usually requires a stay in the hospital. Treatment may include: °· Pain medicine. °· Fluid replacement through an intravenous line (IV). °· Placing a tube in the stomach to remove stomach contents and control vomiting. °· Not eating for 3 or 4 days. This gives your pancreas a rest, because enzymes are not being produced that can cause further damage. °· Antibiotic medicines if your condition is caused by an infection. °· Surgery of the pancreas or gallbladder. °HOME CARE INSTRUCTIONS  °· Follow the diet advised by your  caregiver. This may involve avoiding alcohol and decreasing the amount of fat in your diet. °· Eat smaller, more frequent meals. This reduces the amount of digestive juices the pancreas produces. °· Drink enough fluids to keep your urine clear or pale yellow. °· Only take over-the-counter or prescription medicines as directed by your caregiver. °· Avoid drinking alcohol if it caused your condition. °· Do not smoke. °· Get plenty of rest. °· Check your blood sugar at home as directed by your caregiver. °· Keep all follow-up appointments as directed by your caregiver. °SEEK MEDICAL CARE IF:  °· You do not recover as quickly as expected. °· You develop new or worsening symptoms. °· You have persistent pain, weakness, or nausea. °· You recover and then have another episode of pain. °SEEK IMMEDIATE MEDICAL CARE IF:  °· You are unable to eat or keep fluids down. °· Your pain becomes severe. °· You have a fever or persistent symptoms for more than 2 to 3 days. °· You have a fever and your symptoms suddenly get worse. °· Your skin or the white part of your eyes turn yellow (jaundice). °· You develop vomiting. °· You feel dizzy, or you faint. °· Your blood sugar is high (over 300 mg/dL). °MAKE SURE YOU:  °· Understand these instructions. °· Will watch your condition. °· Will get help right away if you are not doing well or get worse. °Document Released: 04/15/2005 Document Revised: 10/15/2011 Document Reviewed: 07/25/2011 °Exit[employer]® Patient Information ©2014 [employer]. ° °

## 2013-08-14 NOTE — Telephone Encounter (Signed)
pls call pt and arrange ED follow up this week.

## 2013-08-18 NOTE — Telephone Encounter (Signed)
Left detailed message informing patient to call our office and schedule an ED follow up this week.

## 2013-09-23 ENCOUNTER — Emergency Department (HOSPITAL_BASED_OUTPATIENT_CLINIC_OR_DEPARTMENT_OTHER)
Admission: EM | Admit: 2013-09-23 | Discharge: 2013-09-23 | Disposition: A | Discharge status: Home / Self Care | Payer: Self-pay | Attending: Emergency Medicine | Admitting: Emergency Medicine

## 2013-09-23 ENCOUNTER — Encounter (HOSPITAL_BASED_OUTPATIENT_CLINIC_OR_DEPARTMENT_OTHER): Payer: Self-pay | Admitting: Emergency Medicine

## 2013-09-23 DIAGNOSIS — E119 Type 2 diabetes mellitus without complications: Secondary | ICD-10-CM | POA: Insufficient documentation

## 2013-09-23 DIAGNOSIS — G8929 Other chronic pain: Secondary | ICD-10-CM | POA: Insufficient documentation

## 2013-09-23 DIAGNOSIS — F329 Major depressive disorder, single episode, unspecified: Secondary | ICD-10-CM | POA: Insufficient documentation

## 2013-09-23 DIAGNOSIS — Z79899 Other long term (current) drug therapy: Secondary | ICD-10-CM | POA: Insufficient documentation

## 2013-09-23 DIAGNOSIS — K859 Acute pancreatitis without necrosis or infection, unspecified: Secondary | ICD-10-CM | POA: Insufficient documentation

## 2013-09-23 DIAGNOSIS — Z791 Long term (current) use of non-steroidal anti-inflammatories (NSAID): Secondary | ICD-10-CM | POA: Insufficient documentation

## 2013-09-23 DIAGNOSIS — E7800 Pure hypercholesterolemia, unspecified: Secondary | ICD-10-CM | POA: Insufficient documentation

## 2013-09-23 DIAGNOSIS — K219 Gastro-esophageal reflux disease without esophagitis: Secondary | ICD-10-CM | POA: Insufficient documentation

## 2013-09-23 DIAGNOSIS — I10 Essential (primary) hypertension: Secondary | ICD-10-CM | POA: Insufficient documentation

## 2013-09-23 DIAGNOSIS — F3289 Other specified depressive episodes: Secondary | ICD-10-CM | POA: Insufficient documentation

## 2013-09-23 LAB — CBC WITH DIFFERENTIAL/PLATELET
Basophils Absolute: 0 10*3/uL (ref 0.0–0.1)
Basophils Relative: 0 % (ref 0–1)
EOS ABS: 0.1 10*3/uL (ref 0.0–0.7)
EOS PCT: 1 % (ref 0–5)
HCT: 43.6 % (ref 39.0–52.0)
Hemoglobin: 16.1 g/dL (ref 13.0–17.0)
LYMPHS ABS: 1.1 10*3/uL (ref 0.7–4.0)
Lymphocytes Relative: 12 % (ref 12–46)
MCH: 29.9 pg (ref 26.0–34.0)
MCHC: 36.8 g/dL — AB (ref 30.0–36.0)
MCV: 81.2 fL (ref 78.0–100.0)
Monocytes Absolute: 0.5 10*3/uL (ref 0.1–1.0)
Monocytes Relative: 5 % (ref 3–12)
NEUTROS PCT: 83 % — AB (ref 43–77)
Neutro Abs: 7.7 10*3/uL (ref 1.7–7.7)
PLATELETS: 351 10*3/uL (ref 150–400)
RBC: 5.37 MIL/uL (ref 4.22–5.81)
RDW: 13.4 % (ref 11.5–15.5)
WBC: 9.4 10*3/uL (ref 4.0–10.5)

## 2013-09-23 LAB — I-STAT VENOUS BLOOD GAS, ED
ACID-BASE EXCESS: 1 mmol/L (ref 0.0–2.0)
Bicarbonate: 27.8 mEq/L — ABNORMAL HIGH (ref 20.0–24.0)
O2 Saturation: 58 %
TCO2: 29 mmol/L (ref 0–100)
pCO2, Ven: 51 mmHg — ABNORMAL HIGH (ref 45.0–50.0)
pH, Ven: 7.345 — ABNORMAL HIGH (ref 7.250–7.300)
pO2, Ven: 33 mmHg (ref 30.0–45.0)

## 2013-09-23 LAB — URINALYSIS, ROUTINE W REFLEX MICROSCOPIC
Bilirubin Urine: NEGATIVE
Glucose, UA: >1000 mg/dL — AB
Hgb urine dipstick: NEGATIVE
Ketones, ur: 40 mg/dL — AB
Leukocytes, UA: NEGATIVE
NITRITE: NEGATIVE
PH: 5 (ref 5.0–8.0)
Protein, ur: NEGATIVE mg/dL
SPECIFIC GRAVITY, URINE: 1.042 — AB (ref 1.005–1.030)
UROBILINOGEN UA: 0.2 mg/dL (ref 0.0–1.0)

## 2013-09-23 LAB — COMPREHENSIVE METABOLIC PANEL
ALBUMIN: 4.2 g/dL (ref 3.5–5.2)
ALT: 43 U/L (ref 0–53)
AST: 38 U/L — ABNORMAL HIGH (ref 0–37)
Alkaline Phosphatase: 103 U/L (ref 39–117)
BUN: 8 mg/dL (ref 6–23)
CALCIUM: 9.8 mg/dL (ref 8.4–10.5)
CO2: 24 mEq/L (ref 19–32)
Chloride: 92 mEq/L — ABNORMAL LOW (ref 96–112)
Creatinine, Ser: 1 mg/dL (ref 0.50–1.35)
GFR calc non Af Amer: >90 mL/min (ref >90)
GLUCOSE: 314 mg/dL — AB (ref 70–99)
Potassium: 3.9 mEq/L (ref 3.7–5.3)
Sodium: 134 mEq/L — ABNORMAL LOW (ref 137–147)
TOTAL PROTEIN: 7.9 g/dL (ref 6.0–8.3)
Total Bilirubin: 2.2 mg/dL — ABNORMAL HIGH (ref 0.3–1.2)

## 2013-09-23 LAB — URINE MICROSCOPIC-ADD ON

## 2013-09-23 LAB — LIPASE, BLOOD: LIPASE: 294 U/L — AB (ref 11–59)

## 2013-09-23 MED ORDER — BISOPROLOL FUMARATE 10 MG PO TABS: 10.0000 mg | ORAL_TABLET | Freq: Every day | ORAL | Status: DC

## 2013-09-23 MED ORDER — HYDROMORPHONE HCL PF 1 MG/ML IJ SOLN
INTRAMUSCULAR | Status: AC
  Administered 2013-09-23: 1 mg via INTRAVENOUS
  Filled 2013-09-23: qty 1

## 2013-09-23 MED ORDER — METFORMIN HCL 1000 MG PO TABS: 1000.0000 mg | ORAL_TABLET | Freq: Two times a day (BID) | ORAL | Status: DC

## 2013-09-23 MED ORDER — HYDROMORPHONE HCL PF 1 MG/ML IJ SOLN
1.0000 mg | Freq: Once | INTRAMUSCULAR | Status: AC
  Administered 2013-09-23: 1 mg via INTRAVENOUS

## 2013-09-23 MED ORDER — OXYCODONE-ACETAMINOPHEN 5-325 MG PO TABS: 1.0000 | ORAL_TABLET | ORAL | Status: DC | PRN

## 2013-09-23 MED ORDER — HYDROMORPHONE HCL PF 1 MG/ML IJ SOLN
1.0000 mg | Freq: Once | INTRAMUSCULAR | Status: AC
  Administered 2013-09-23: 1 mg via INTRAVENOUS
  Filled 2013-09-23: qty 1

## 2013-09-23 MED ORDER — PROMETHAZINE HCL 25 MG PO TABS: 25.0000 mg | ORAL_TABLET | Freq: Four times a day (QID) | ORAL | Status: DC | PRN

## 2013-09-23 MED ORDER — ONDANSETRON HCL 4 MG/2ML IJ SOLN
4.0000 mg | Freq: Once | INTRAMUSCULAR | Status: AC
  Administered 2013-09-23: 4 mg via INTRAVENOUS
  Filled 2013-09-23: qty 2

## 2013-09-23 MED ORDER — SODIUM CHLORIDE 0.9 % IV BOLUS (SEPSIS)
1000.0000 mL | Freq: Once | INTRAVENOUS | Status: AC
  Administered 2013-09-23: 1000 mL via INTRAVENOUS

## 2013-09-23 MED ORDER — MORPHINE SULFATE 4 MG/ML IJ SOLN
4.0000 mg | Freq: Once | INTRAMUSCULAR | Status: AC
  Administered 2013-09-23: 4 mg via INTRAVENOUS
  Filled 2013-09-23: qty 1

## 2013-09-23 NOTE — ED Notes (Signed)
Patient drinking water / no difficulties

## 2013-09-23 NOTE — Discharge Instructions (Signed)

## 2013-09-23 NOTE — ED Notes (Addendum)
Abd pain for couple days.  Pt sts he has pancreatitis.  Sts he has had it 6 times and this is what it feels like.  Pt reports not taking any of his meds x2 months due to loss of ins.

## 2013-09-23 NOTE — ED Provider Notes (Signed)
CSN: [id number]     Arrival date & time 09/23/13  1603 History   First MD Initiated Contact with Patient 09/23/13 1609     Chief Complaint  Patient presents with  . Abdominal Pain     (Consider location/radiation/quality/duration/timing/severity/associated sxs/prior Treatment) HPI Comments: Pt states that he has had vomiting and generalized abdominal pain that started a couple of days. Pt states that he has a history of pancreatitis and it feels similar. No fever, diarrhea. Pt states that he had a beer 3 days ago. Pt hasn't been taking any of his medication because he lost is insurance  The history is provided by the patient. No language interpreter was used.    Past Medical History  Diagnosis Date  . Alcoholic pancreatitis     recurrent  . Diabetes mellitus, type II     New Onset 03/2010  . Hypertension   . 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 High cholesterol   . Sleep apnea   . GERD (gastroesophageal reflux disease)   . Depression    Past Surgical History  Procedure Laterality Date  . Lumbar microdiscectomy  ~ 2004    Dr Ellene Route   Family History  Problem Relation Age of Onset  . Diabetes Mother   . Lung cancer Brother     twin brother  . Colon cancer Neg Hx   . Pancreatic cancer Paternal Aunt    History  Substance Use Topics  . Smoking status: Never Smoker   . Smokeless tobacco: Never Used  . Alcohol Use: Yes     Comment: last drink 4 days ago    Review of Systems  Constitutional: Negative.   Respiratory: Negative.   Cardiovascular: Negative.       Allergies  Review of patient's allergies indicates no known allergies.  Home Medications   Prior to Admission medications   Medication Sig Start Date End Date Taking? Authorizing Provider  atorvastatin (LIPITOR) 10 MG tablet TAKE 1 TABLET (10 MG TOTAL) BY MOUTH DAILY. 05/05/13   Debbrah Alar, NP  BENICAR HCT 40-25 MG per tablet TAKE 1 TABLET BY  MOUTH DAILY. 05/05/13   Debbrah Alar, NP  bisoprolol (ZEBETA) 10 MG tablet TAKE 1 TABLET (10 MG TOTAL) BY MOUTH DAILY. 05/05/13   Debbrah Alar, NP  citalopram (CELEXA) 20 MG tablet Take 1 tablet (20 mg total) by mouth daily. 05/14/13   Debbrah Alar, NP  DULoxetine (CYMBALTA) 60 MG capsule Take 1 capsule (60 mg total) by mouth daily. 05/07/13   Debbrah Alar, NP  HYDROmorphone (DILAUDID) 4 MG tablet Take 1 tablet (4 mg total) by mouth every 4 (four) hours as needed for severe pain. 06/11/13   Karen Chafe Molpus, MD  hydrOXYzine (ATARAX/VISTARIL) 25 MG tablet Take 1 tablet (25 mg total) by mouth every 4 (four) hours as needed for anxiety. 02/15/13   Waylan Boga, NP  meloxicam (MOBIC) 7.5 MG tablet Take 1 tablet (7.5 mg total) by mouth daily. 04/20/13   Debbrah Alar, NP  metFORMIN (GLUCOPHAGE) 500 MG tablet Take 2 tablets twice a day.    Historical Provider, MD  oxyCODONE-acetaminophen (PERCOCET) 5-325 MG per tablet Take 1 tablet by mouth every 6 (six) hours as needed for moderate pain. 08/14/13   Osvaldo Shipper, MD  pantoprazole (PROTONIX) 40 MG tablet Take 1 tablet (40 mg total) by mouth daily. 02/15/13   Waylan Boga, NP  potassium chloride SA (K-DUR,KLOR-CON) 20 MEQ tablet  Take 1 tablet (20 mEq total) by mouth 2 (two) times daily. 06/11/13   John L Molpus, MD   BP 174/120  Pulse 99  Temp(Src) 98.3 F (36.8 C) (Oral)  Resp 18  Ht 5\' 11"  (1.803 m)  Wt 198 lb (89.812 kg)  BMI 27.63 kg/m2  SpO2 100% Physical Exam  Nursing note and vitals reviewed. Constitutional: He is oriented to person, place, and time. He appears well-developed and well-nourished.  Cardiovascular: Normal rate and regular rhythm.   Pulmonary/Chest: Effort normal and breath sounds normal.  Abdominal: Soft. Bowel sounds are normal.  diffuse tenderness  Musculoskeletal: Normal range of motion.  Neurological: He is alert and oriented to person, place, and time.  Skin: Skin is warm and dry.    ED  Course  Procedures (including critical care time) Labs Review Labs Reviewed  CBC WITH DIFFERENTIAL - Abnormal; Notable for the following:    MCHC 36.8 (*)    Neutrophils Relative % 83 (*)    All other components within normal limits  COMPREHENSIVE METABOLIC PANEL - Abnormal; Notable for the following:    Sodium 134 (*)    Chloride 92 (*)    Glucose, Bld 314 (*)    AST 38 (*)    Total Bilirubin 2.2 (*)    All other components within normal limits  LIPASE, BLOOD - Abnormal; Notable for the following:    Lipase 294 (*)    All other components within normal limits  URINALYSIS, ROUTINE W REFLEX MICROSCOPIC - Abnormal; Notable for the following:    Specific Gravity, Urine 1.042 (*)    Glucose, UA >1000 (*)    Ketones, ur 40 (*)    All other components within normal limits  I-STAT VENOUS BLOOD GAS, ED - Abnormal; Notable for the following:    pH, Ven 7.345 (*)    pCO2, Ven 51.0 (*)    Bicarbonate 27.8 (*)    All other components within normal limits  URINE CULTURE  URINE MICROSCOPIC-ADD ON  BLOOD GAS, VENOUS    Imaging Review No results found.   EKG Interpretation None      MDM   Final diagnoses:  Pancreatitis  Diabetes  HTN (hypertension)    Pt is feeling better and tolerating po at this time. Pt looks to have a chronic pancreatitis. Pt not taking his medications, glucose elevated, pt not in dka. He agreed to take his metformin and one bp medication due to cost    Glendell Docker, NP 09/23/13 2035

## 2013-09-24 ENCOUNTER — Telehealth: Payer: Self-pay | Admitting: Family

## 2013-09-24 LAB — URINE CULTURE: Colony Count: 50000

## 2013-09-24 NOTE — ED Provider Notes (Signed)
Medical screening examination/treatment/procedure(s) were performed by non-physician practitioner and as supervising physician I was immediately available for consultation/collaboration.  Megan E Docherty, MD 09/24/13 0931 

## 2013-09-24 NOTE — Telephone Encounter (Signed)
Patient states that he does not have insurance right now and cannot afford to come in for an appointment.

## 2013-09-24 NOTE — Telephone Encounter (Signed)
Noted  

## 2013-09-24 NOTE — Telephone Encounter (Signed)
Please contact pt and arrange a hospital follow up next week.

## 2013-11-05 ENCOUNTER — Telehealth: Payer: Self-pay | Admitting: Family

## 2013-11-05 NOTE — Telephone Encounter (Signed)
Left message for patient to schedule follow up with [name] last OV 03/2013, diabetic bundle pt needs BP check, LDL & A1C

## 2014-05-05 ENCOUNTER — Ambulatory Visit: Payer: Self-pay | Admitting: Medical

## 2014-05-24 ENCOUNTER — Encounter: Payer: Self-pay | Admitting: Family

## 2014-05-24 ENCOUNTER — Ambulatory Visit (INDEPENDENT_AMBULATORY_CARE_PROVIDER_SITE_OTHER): Payer: 59 | Admitting: Family

## 2014-05-24 VITALS — BP 135/96 | HR 85 | Temp 97.6°F | Resp 16 | Ht 71.5 in | Wt 205.0 lb

## 2014-05-24 DIAGNOSIS — R358 Other polyuria: Secondary | ICD-10-CM

## 2014-05-24 DIAGNOSIS — E785 Hyperlipidemia, unspecified: Secondary | ICD-10-CM

## 2014-05-24 DIAGNOSIS — Z23 Encounter for immunization: Secondary | ICD-10-CM

## 2014-05-24 DIAGNOSIS — R3589 Other polyuria: Secondary | ICD-10-CM

## 2014-05-24 DIAGNOSIS — Z0000 Encounter for general adult medical examination without abnormal findings: Secondary | ICD-10-CM

## 2014-05-24 DIAGNOSIS — F418 Other specified anxiety disorders: Secondary | ICD-10-CM

## 2014-05-24 DIAGNOSIS — F1010 Alcohol abuse, uncomplicated: Secondary | ICD-10-CM

## 2014-05-24 DIAGNOSIS — I10 Essential (primary) hypertension: Secondary | ICD-10-CM

## 2014-05-24 DIAGNOSIS — E119 Type 2 diabetes mellitus without complications: Secondary | ICD-10-CM

## 2014-05-24 DIAGNOSIS — R634 Abnormal weight loss: Secondary | ICD-10-CM

## 2014-05-24 DIAGNOSIS — K219 Gastro-esophageal reflux disease without esophagitis: Secondary | ICD-10-CM

## 2014-05-24 LAB — BASIC METABOLIC PANEL
BUN: 6 mg/dL (ref 6–23)
CALCIUM: 9.4 mg/dL (ref 8.4–10.5)
CO2: 26 mEq/L (ref 19–32)
CREATININE: 0.74 mg/dL (ref 0.40–1.50)
Chloride: 97 mEq/L (ref 96–112)
GFR: 152.56 mL/min (ref >60.00)
Glucose, Bld: 372 mg/dL — ABNORMAL HIGH (ref 70–99)
POTASSIUM: 3.5 meq/L (ref 3.5–5.1)
SODIUM: 133 meq/L — AB (ref 135–145)

## 2014-05-24 LAB — HEPATIC FUNCTION PANEL
ALT: 22 U/L (ref 0–53)
AST: 15 U/L (ref 0–37)
Albumin: 4.2 g/dL (ref 3.5–5.2)
Alkaline Phosphatase: 136 U/L — ABNORMAL HIGH (ref 39–117)
BILIRUBIN DIRECT: 0.1 mg/dL (ref 0.0–0.3)
Total Bilirubin: 0.6 mg/dL (ref 0.2–1.2)
Total Protein: 7.4 g/dL (ref 6.0–8.3)

## 2014-05-24 LAB — LIPID PANEL
CHOL/HDL RATIO: 4
Cholesterol: 164 mg/dL (ref 0–200)
HDL: 36.7 mg/dL — ABNORMAL LOW (ref >39.00)
NonHDL: 127.3
Triglycerides: 263 mg/dL — ABNORMAL HIGH (ref 0.0–149.0)
VLDL: 52.6 mg/dL — AB (ref 0.0–40.0)

## 2014-05-24 LAB — MICROALBUMIN / CREATININE URINE RATIO
Creatinine,U: 65.5 mg/dL
MICROALB UR: 1 mg/dL (ref 0.0–1.9)
Microalb Creat Ratio: 1.5 mg/g (ref 0.0–30.0)

## 2014-05-24 LAB — TSH: TSH: 2.6 u[IU]/mL (ref 0.35–4.50)

## 2014-05-24 LAB — HEMOGLOBIN A1C: Hgb A1c MFr Bld: 15 % — ABNORMAL HIGH (ref 4.6–6.5)

## 2014-05-24 LAB — LDL CHOLESTEROL, DIRECT: Direct LDL: 99 mg/dL

## 2014-05-24 MED ORDER — OMEPRAZOLE MAGNESIUM 20 MG PO TBEC: 20.0000 mg | DELAYED_RELEASE_TABLET | Freq: Every day | ORAL | Status: DC

## 2014-05-24 MED ORDER — LISINOPRIL 10 MG PO TABS: 10.0000 mg | ORAL_TABLET | Freq: Every day | ORAL | Status: DC

## 2014-05-24 NOTE — Patient Instructions (Addendum)
Please complete lab work prior to leaving. You will be contacted about your eye exam.  Start prilosec otc 20mg  once daily for reflux. Start lisinopril 10mg  once daily for blood pressure. Follow up in 1 month.

## 2014-05-24 NOTE — Progress Notes (Signed)
Subjective:    Patient ID: Willie [name], male    DOB: October 09, 1975, 39 y.o.   MRN: [id number]  HPI  Willie [name] is a 39 yr old male who presents today for follow up.  He was last seen in our office 12/14.   1) DM2-  Not taking metformin. Not compliant with diabetic diet.   Wt Readings from Last 3 Encounters:  05/24/14 205 lb (92.987 kg)  09/23/13 198 lb (89.812 kg)  06/11/13 220 lb (99.791 kg)  + polyuria, polydipsia.   2) Depression- not currently on meds.  Mood is good. Back at work after period of unemployment. Works for [employer] in [place] and has worked there for 1 year.   3) Hyperlipidemia-  Patient is fasting.   4) HTN-  BP Readings from Last 3 Encounters:  05/24/14 135/96  09/23/13 174/114  06/11/13 167/109   5) ETOH abuse-  Reports that his last drink was 4-5 months ago.    6) gerd- using tums, still having symptoms, used to take protonix.   Review of Systems See HPI  Past Medical History  Diagnosis Date  . Alcoholic pancreatitis     recurrent  . Diabetes mellitus, type II     New Onset 03/2010  . Hypertension   . 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 High cholesterol   . Sleep apnea   . GERD (gastroesophageal reflux disease)   . Depression     History   Social History  . Marital Status: Legally Separated    Spouse Name: N/A    Number of Children: 2  . 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 Smoking status: Never Smoker   . Smokeless tobacco: Never Used  . Alcohol Use: No     Comment: last drink 4 days ago  . Drug Use: No  . Sexual Activity: Not on file   Other Topics Concern  . 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 Lumbar microdiscectomy  ~ 2004    Dr Ellene Route    Family History  Problem Relation Age of Onset  . Diabetes Mother   . Lung cancer Brother     twin brother  . Colon cancer Neg Hx   . Pancreatic cancer Paternal Aunt     No Known Allergies  No current outpatient prescriptions on file prior to visit.   No current facility-administered medications on file prior to visit.    BP 135/96 mmHg  Pulse 85  Temp(Src) 97.6 F (36.4 C) (Oral)  Resp 16  Ht 5' 11.5" (1.816 m)  Wt 205 lb (92.987 kg)  BMI 28.20 kg/m2  SpO2 98%       Objective:   Physical Exam  Constitutional: He is oriented to person, place, and time.  Appears healthier looking/happier than last visit  HENT:  Head: Normocephalic and atraumatic.  Cardiovascular: Normal rate and regular rhythm.   No murmur heard. Pulmonary/Chest: Effort normal and breath sounds normal. No respiratory  distress. He has no wheezes. He has no rales. He exhibits no tenderness.  Neurological: He is alert and oriented to person, place, and time.  Psychiatric: He has a normal mood and affect. His behavior is normal. Judgment and thought content normal.          Assessment & Plan:

## 2014-05-24 NOTE — Progress Notes (Signed)
Pre visit review using our clinic review tool, if applicable. No additional management support is needed unless otherwise documented below in the visit note. 

## 2014-05-24 NOTE — Assessment & Plan Note (Signed)
In remission, I applauded pt for abstaining from alcohol.

## 2014-05-24 NOTE — Assessment & Plan Note (Signed)
BP mildly elevated.  Add low dose ace for BP control and renal protection.

## 2014-05-24 NOTE — Assessment & Plan Note (Signed)
Obtain lipid panel.  Consider statin if LDL above goal.

## 2014-05-24 NOTE — Assessment & Plan Note (Signed)
Currently well controlled off meds.  Monitor.

## 2014-05-24 NOTE — Assessment & Plan Note (Signed)
Recommend prilosec otc.

## 2014-05-24 NOTE — Assessment & Plan Note (Signed)
Suspect elevated sugars.  Check A1C.  If elevated, resume metformin.

## 2014-05-25 ENCOUNTER — Encounter: Payer: Self-pay | Admitting: Family

## 2014-05-25 ENCOUNTER — Telehealth: Payer: Self-pay | Admitting: Family

## 2014-05-25 LAB — URINE CULTURE
COLONY COUNT: NO GROWTH
Organism ID, Bacteria: NO GROWTH

## 2014-05-25 MED ORDER — ESOMEPRAZOLE MAGNESIUM 40 MG PO CPDR: 40.0000 mg | DELAYED_RELEASE_CAPSULE | Freq: Every day | ORAL | Status: DC

## 2014-05-25 MED ORDER — CANAGLIFLOZIN-METFORMIN HCL 50-500 MG PO TABS: 1.0000 | ORAL_TABLET | Freq: Two times a day (BID) | ORAL | Status: DC

## 2014-05-25 NOTE — Telephone Encounter (Signed)
Incorrect phone number in EMR chart; sent message via My Chart with provider information to reach patient in timely manner [see below]; awaiting return call/SLS

## 2014-05-25 NOTE — Telephone Encounter (Signed)
Please contact pt and let him know that sugar is extremely high.  I would recommend that he start invokamet.  He should go onto [employer] and register for coupon for $0 copay or call Call 1-877-INVOKAMET [phone]).  Needs to check sugar twice daily.  Call me if sugar remains >250 after starting invokamet.  Follow up in 1 month. If he does not have a meter please send rx to his pharmacy for one touch meter, test strips/lancets and book nurse visit if needed for teaching.  He also needs to work on dietary changes for diabetes.

## 2014-05-27 MED ORDER — ONETOUCH BASIC SYSTEM W/DEVICE KIT: PACK | Status: DC

## 2014-05-27 MED ORDER — ONETOUCH ULTRASOFT LANCETS MISC: Status: DC

## 2014-05-27 MED ORDER — GLUCOSE BLOOD VI STRP: ORAL_STRIP | Status: DC

## 2014-05-27 NOTE — Telephone Encounter (Signed)
Patient received message from pharmacy stating that his Insurance will no longer cover the Nexium and that they will cover Pantoprazole; requesting to have change made and protonix sent to pharmacy [pended Rx for your signature]/SLS Please Advise on change and refills/SLS  All Blood Glucose Testing supplies sent to pharmacy and [place] went ahead and retrieved the coupon for pt's Invokamet for him. Patient states he has previously attended the education classes for Diabetes and/or blood glucose testing/SLS

## 2014-05-29 MED ORDER — PANTOPRAZOLE SODIUM 40 MG PO TBEC: 40.0000 mg | DELAYED_RELEASE_TABLET | Freq: Every day | ORAL | Status: DC

## 2014-05-31 ENCOUNTER — Telehealth: Payer: Self-pay | Admitting: *Deleted

## 2014-05-31 NOTE — Telephone Encounter (Signed)
Prior authorization initiated for TRUEtest strips. Awaiting determination. JG//CMA

## 2014-06-09 ENCOUNTER — Telehealth: Payer: Self-pay | Admitting: Family

## 2014-06-09 MED ORDER — TADALAFIL 10 MG PO TABS: 10.0000 mg | ORAL_TABLET | Freq: Every day | ORAL | Status: DC | PRN

## 2014-06-09 NOTE — Telephone Encounter (Signed)
Pt notified and made aware 

## 2014-06-09 NOTE — Telephone Encounter (Signed)
Last OV:  05/24/14  Please advise.

## 2014-06-09 NOTE — Telephone Encounter (Signed)
rx sent for cialis.

## 2014-06-09 NOTE — Telephone Encounter (Signed)
Caller name: [place] Relation to pt: self Call back number: [phone] Pharmacy: [facility]  Reason for call:   Requesting a medication for erectile dysfunction. He states that he has talked to [place] about this.

## 2014-06-24 ENCOUNTER — Ambulatory Visit: Payer: Self-pay | Admitting: Family

## 2014-06-27 ENCOUNTER — Encounter: Payer: Self-pay | Admitting: Family

## 2014-06-27 ENCOUNTER — Ambulatory Visit (INDEPENDENT_AMBULATORY_CARE_PROVIDER_SITE_OTHER): Payer: 59 | Admitting: Family

## 2014-06-27 VITALS — BP 120/86 | HR 76 | Temp 97.7°F | Resp 16 | Ht 71.5 in | Wt 215.0 lb

## 2014-06-27 DIAGNOSIS — E785 Hyperlipidemia, unspecified: Secondary | ICD-10-CM

## 2014-06-27 DIAGNOSIS — E1165 Type 2 diabetes mellitus with hyperglycemia: Secondary | ICD-10-CM

## 2014-06-27 DIAGNOSIS — IMO0002 Reserved for concepts with insufficient information to code with codable children: Secondary | ICD-10-CM

## 2014-06-27 DIAGNOSIS — L309 Dermatitis, unspecified: Secondary | ICD-10-CM | POA: Insufficient documentation

## 2014-06-27 MED ORDER — LISINOPRIL 10 MG PO TABS: 10.0000 mg | ORAL_TABLET | Freq: Every day | ORAL | Status: DC

## 2014-06-27 MED ORDER — CANAGLIFLOZIN-METFORMIN HCL 50-500 MG PO TABS: 1.0000 | ORAL_TABLET | Freq: Two times a day (BID) | ORAL | Status: DC

## 2014-06-27 NOTE — Patient Instructions (Signed)
Continue invokamet, diet, exercise.   You may use over the counter hydrocortisone cream 0.5% as needed for eczema on your face, but use care to avoid getting hydrocortisone cream in or near your eyes.  Follow up in 2 months.

## 2014-06-27 NOTE — Assessment & Plan Note (Signed)
Stable. Advised pt against use of high potency steroids on his face.  Advised ok to use low dose steroid such as OTC hydrocortisone 0.5% as long as he does not use on or near his eyes.

## 2014-06-27 NOTE — Assessment & Plan Note (Signed)
Improving, continue invokamet, diabetic diet.  Plan recheck A1C next visit.

## 2014-06-27 NOTE — Progress Notes (Signed)
Subjective:    Patient ID: Willie [name], male    DOB: 1975/05/06, 39 y.o.   MRN: [id number]  HPI  Willie [name] is a 39 yr old male who presents today for follow up of his diabetes.  Pt is currently maintained on the following medications for diabetes:invokamet Last A1C was:   Lab Results  Component Value Date   HGBA1C 15.0* 05/24/2014  Last diabetic eye exam was >1 year ago- he will schedule Reports improvement in polyuria/polydipsia. Denies hypoglycemia Home glucose readings range 100-200 but has not checked for several weeks due to running out of strips.   Eczema-  Reports that he has been using triamcinolone from a family member on his face and noted that this has cleared up his eczema.   Review of Systems    see HPI  Past Medical History  Diagnosis Date  . Alcoholic pancreatitis     recurrent  . Diabetes mellitus, type II     New Onset 03/2010  . Hypertension   . 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 High cholesterol   . Sleep apnea   . GERD (gastroesophageal reflux disease)   . Depression     History   Social History  . Marital Status: Legally Separated    Spouse Name: N/A  . Number of Children: 2  . 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 Smoking status: Never Smoker   . Smokeless tobacco: Never Used  . Alcohol Use: No     Comment: last drink 4 days ago  . Drug Use: No  . Sexual Activity: Not on file   Other Topics Concern  . 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 Lumbar microdiscectomy  ~ 2004    Dr Ellene Route    Family History  Problem Relation Age of Onset  .  Diabetes Mother   . Lung cancer Brother     twin brother  . Colon cancer Neg Hx   . Pancreatic cancer Paternal Aunt     No Known Allergies  Current Outpatient Prescriptions on File Prior to Visit  Medication Sig Dispense Refill  . glucose blood test strip Use as instructed TO TEST BLOOD GLUCOSE TWICE DAILY WITH ONETOUCH DX: E11.9 100 each 5  . Lancets (ONETOUCH ULTRASOFT) lancets Use as instructed TO TEST BLOOD GLUCOSE TWICE DAILY DX: E11.9 100 each 5  . pantoprazole (PROTONIX) 40 MG tablet Take 1 tablet (40 mg total) by mouth daily. 30 tablet 3  . tadalafil (CIALIS) 10 MG tablet Take 1 tablet (10 mg total) by mouth daily as needed for erectile dysfunction. 10 tablet 2   No current facility-administered medications on file prior to visit.    BP 120/86 mmHg  Pulse 76  Temp(Src) 97.7 F (36.5 C) (Oral)  Resp 16  Ht 5' 11.5" (1.816 m)  Wt 215 lb (97.523 kg)  BMI 29.57 kg/m2  SpO2 98%    Objective:   Physical Exam  Constitutional: He is oriented  to person, place, and time. He appears well-developed and well-nourished. No distress.  HENT:  Head: Normocephalic and atraumatic.  Cardiovascular: Normal rate and regular rhythm.   No murmur heard. Pulmonary/Chest: Effort normal and breath sounds normal. No respiratory distress. He has no wheezes. He has no rales. He exhibits no tenderness.  Musculoskeletal: He exhibits no edema.  Neurological: He is alert and oriented to person, place, and time.  Psychiatric: He has a normal mood and affect. His behavior is normal. Thought content normal.          Assessment & Plan:

## 2014-06-27 NOTE — Progress Notes (Signed)
Pre visit review using our clinic review tool, if applicable. No additional management support is needed unless otherwise documented below in the visit note. 

## 2014-07-06 LAB — HM DIABETES EYE EXAM

## 2014-07-13 ENCOUNTER — Other Ambulatory Visit (HOSPITAL_BASED_OUTPATIENT_CLINIC_OR_DEPARTMENT_OTHER): Payer: Self-pay | Admitting: Ophthalmology

## 2014-07-18 ENCOUNTER — Other Ambulatory Visit (HOSPITAL_BASED_OUTPATIENT_CLINIC_OR_DEPARTMENT_OTHER): Payer: Self-pay | Admitting: Ophthalmology

## 2014-07-18 DIAGNOSIS — G902 Horner's syndrome: Secondary | ICD-10-CM

## 2014-07-19 ENCOUNTER — Telehealth: Payer: Self-pay | Admitting: *Deleted

## 2014-07-19 DIAGNOSIS — IMO0002 Reserved for concepts with insufficient information to code with codable children: Secondary | ICD-10-CM

## 2014-07-19 DIAGNOSIS — E1165 Type 2 diabetes mellitus with hyperglycemia: Secondary | ICD-10-CM

## 2014-07-19 NOTE — Telephone Encounter (Signed)
Future lab order entered. Notified Linda in Radiology.

## 2014-07-19 NOTE — Telephone Encounter (Signed)
Received call from [facility] in radiology, ext [phone] stating pt's eye doctor is ordering CTA head orbits and neck CT to rule out horner's syndrome. They are needing bun/creat in the last 6 weeks. We do not have recent result. Can we order lab and have pt complete it here?

## 2014-07-19 NOTE — Telephone Encounter (Signed)
Yes, dx dm2 pls

## 2014-07-20 ENCOUNTER — Other Ambulatory Visit (HOSPITAL_BASED_OUTPATIENT_CLINIC_OR_DEPARTMENT_OTHER): Payer: Self-pay | Admitting: Ophthalmology

## 2014-07-20 ENCOUNTER — Other Ambulatory Visit (INDEPENDENT_AMBULATORY_CARE_PROVIDER_SITE_OTHER): Payer: 59

## 2014-07-20 DIAGNOSIS — IMO0002 Reserved for concepts with insufficient information to code with codable children: Secondary | ICD-10-CM

## 2014-07-20 DIAGNOSIS — E1165 Type 2 diabetes mellitus with hyperglycemia: Secondary | ICD-10-CM

## 2014-07-20 DIAGNOSIS — G902 Horner's syndrome: Secondary | ICD-10-CM

## 2014-07-20 LAB — BASIC METABOLIC PANEL
BUN: 12 mg/dL (ref 6–23)
CALCIUM: 9.7 mg/dL (ref 8.4–10.5)
CO2: 26 mEq/L (ref 19–32)
CREATININE: 0.94 mg/dL (ref 0.40–1.50)
Chloride: 99 mEq/L (ref 96–112)
GFR: 115.66 mL/min (ref >60.00)
GLUCOSE: 260 mg/dL — AB (ref 70–99)
Potassium: 4 mEq/L (ref 3.5–5.1)
Sodium: 134 mEq/L — ABNORMAL LOW (ref 135–145)

## 2014-07-21 ENCOUNTER — Ambulatory Visit (HOSPITAL_BASED_OUTPATIENT_CLINIC_OR_DEPARTMENT_OTHER)
Admission: RE | Admit: 2014-07-21 | Discharge: 2014-07-21 | Disposition: A | Discharge status: Home / Self Care | Payer: 59 | Source: Ambulatory Visit | Attending: Ophthalmology | Admitting: Ophthalmology

## 2014-07-21 ENCOUNTER — Encounter (HOSPITAL_BASED_OUTPATIENT_CLINIC_OR_DEPARTMENT_OTHER): Payer: Self-pay

## 2014-07-21 ENCOUNTER — Encounter: Payer: Self-pay | Admitting: Family

## 2014-07-21 DIAGNOSIS — G902 Horner's syndrome: Secondary | ICD-10-CM | POA: Diagnosis present

## 2014-07-21 MED ORDER — IOHEXOL 350 MG/ML SOLN
100.0000 mL | Freq: Once | INTRAVENOUS | Status: AC | PRN
  Administered 2014-07-21: 100 mL via INTRAVENOUS

## 2014-07-22 ENCOUNTER — Encounter: Payer: Self-pay | Admitting: Family

## 2014-07-23 ENCOUNTER — Ambulatory Visit (HOSPITAL_BASED_OUTPATIENT_CLINIC_OR_DEPARTMENT_OTHER): Payer: 59

## 2014-07-25 ENCOUNTER — Other Ambulatory Visit: Payer: Self-pay

## 2014-08-05 ENCOUNTER — Encounter: Payer: Self-pay | Admitting: Physician Assistant

## 2014-08-05 ENCOUNTER — Ambulatory Visit (INDEPENDENT_AMBULATORY_CARE_PROVIDER_SITE_OTHER): Payer: 59 | Admitting: Physician Assistant

## 2014-08-05 VITALS — BP 133/99 | HR 93 | Temp 98.2°F | Resp 16 | Ht 71.5 in | Wt 215.2 lb

## 2014-08-05 DIAGNOSIS — N481 Balanitis: Secondary | ICD-10-CM | POA: Diagnosis not present

## 2014-08-05 MED ORDER — FLUCONAZOLE 150 MG PO TABS: 150.0000 mg | ORAL_TABLET | Freq: Once | ORAL | Status: DC

## 2014-08-05 NOTE — Assessment & Plan Note (Signed)
Seems mostly yeast related.  Moderate inflammation.  Rx Diflucan. Encouraged OTC cortisone.  NSAIDs.  Hygiene measures reviewed.  Will obtain urine gc/chalmydia, RPR and ancillary testing for trich/bv giving lesion.

## 2014-08-05 NOTE — Patient Instructions (Signed)
Please take the Diflucan as directed. Keep the area clean and dry. Can apply OTC Hydrocortisone to the area. Baby Powders may also be beneficial to keep area clean and dry. Avoid intercourse until this clears up.

## 2014-08-05 NOTE — Addendum Note (Signed)
Addended by: Rockwell Germany on: 08/05/2014 02:58 PM   Modules accepted: Orders

## 2014-08-05 NOTE — Progress Notes (Signed)
Pre visit review using our clinic review tool, if applicable. No additional management support is needed unless otherwise documented below in the visit note/SLS  

## 2014-08-05 NOTE — Progress Notes (Signed)
Patient presents to clinic today c/o peeling, pruritic and painful rash of penile head and shaft.  Endorses wife just diagnosed with yeast infection.  Denies penile drainage.  Denies fever, chills, malaise or fatigue.  Past Medical History  Diagnosis Date  . Alcoholic pancreatitis     recurrent  . Diabetes mellitus, type II     New Onset 03/2010  . Hypertension   . 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 High cholesterol   . Sleep apnea   . GERD (gastroesophageal reflux disease)   . Depression     Current Outpatient Prescriptions on File Prior to Visit  Medication Sig Dispense Refill  . Canagliflozin-Metformin HCl (INVOKAMET) 50-500 MG TABS Take 1 tablet by mouth 2 (two) times daily. 60 tablet 3  . glucose blood test strip Use as instructed TO TEST BLOOD GLUCOSE TWICE DAILY WITH ONETOUCH DX: E11.9 100 each 5  . Lancets (ONETOUCH ULTRASOFT) lancets Use as instructed TO TEST BLOOD GLUCOSE TWICE DAILY DX: E11.9 100 each 5  . lisinopril (PRINIVIL,ZESTRIL) 10 MG tablet Take 1 tablet (10 mg total) by mouth daily. 30 tablet 3  . pantoprazole (PROTONIX) 40 MG tablet Take 1 tablet (40 mg total) by mouth daily. 30 tablet 3  . tadalafil (CIALIS) 10 MG tablet Take 1 tablet (10 mg total) by mouth daily as needed for erectile dysfunction. 10 tablet 2   No current facility-administered medications on file prior to visit.    No Known Allergies  Family History  Problem Relation Age of Onset  . Diabetes Mother   . Lung cancer Brother     twin brother  . Colon cancer Neg Hx   . Pancreatic cancer Paternal Aunt     History   Social History  . Marital Status: Legally Separated    Spouse Name: N/A  . Number of Children: 2  . 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 Smoking status: Never Smoker   . Smokeless tobacco: Never Used  . Alcohol Use: No      Comment: last drink 4 days ago  . Drug Use: No  . Sexual Activity: Not on file   Other Topics Concern  . 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  All other ROS are negative.  BP 133/99 mmHg  Pulse 93  Temp(Src) 98.2 F (36.8 C) (Oral)  Resp 16  Ht 5' 11.5" (1.816 m)  Wt 215 lb 4 oz (97.637 kg)  BMI 29.61 kg/m2  SpO2 97%  Physical Exam  Constitutional: He is oriented to person, place, and time and well-developed, well-nourished, and in no distress.  HENT:  Head: Normocephalic and atraumatic.  Cardiovascular: Normal rate, regular rhythm, normal heart sounds and intact distal pulses.   Pulmonary/Chest: Effort normal and breath sounds normal. No respiratory distress. He has no wheezes. He has no rales. He exhibits no tenderness.  Genitourinary: Testes/scrotum normal. Penis exhibits lesions. Penis exhibits no edema.  Area of erythema, scaling and shiny appearance on glans penis and penile shaft.  Small ulceration noted just underneath the dorsal aspect of glans.  Neurological: He is alert and oriented to person, place, and time.  Skin: Skin is warm and dry.  Vitals reviewed.   Recent Results (from the past 2160 hour(s))  Lipid panel     Status: Abnormal   Collection Time: 05/24/14  9:00 AM  Result Value Ref Range   Cholesterol 164 0 - 200 mg/dL    Comment: ATP III Classification       Desirable:  < 200 mg/dL               Borderline High:  200 - 239 mg/dL          High:  > = 240 mg/dL   Triglycerides 263.0 (H) 0.0 - 149.0 mg/dL    Comment: Normal:  <150 mg/dLBorderline High:  150 - 199 mg/dL   HDL 36.70 (L) >39.00 mg/dL   VLDL 52.6 (H) 0.0 - 40.0 mg/dL   Total CHOL/HDL Ratio 4     Comment:                Men          Women1/2 Average Risk     3.4           3.3Average Risk          5.0          4.42X Average Risk          9.6          7.13X Average Risk          15.0          11.0                       NonHDL 127.30     Comment: NOTE:  Non-HDL goal should be 30 mg/dL higher than patient's LDL goal (i.e. LDL goal of < 70 mg/dL, would have non-HDL goal of < 100 mg/dL)  TSH     Status: None   Collection Time: 05/24/14  9:00 AM  Result Value Ref Range   TSH 2.60 0.35 - 4.50 uIU/mL  Hemoglobin A1c     Status: Abnormal   Collection Time: 05/24/14  9:00 AM  Result Value Ref Range   Hgb A1c MFr Bld 15.0 (H) 4.6 - 6.5 %    Comment: Glycemic Control Guidelines for People with Diabetes:Non Diabetic:  <6%Goal of Therapy: <7%Additional Action Suggested:  >8%   Microalbumin / creatinine urine ratio     Status: None   Collection Time: 05/24/14  9:00 AM  Result Value Ref Range   Microalb, Ur 1.0 0.0 - 1.9 mg/dL   Creatinine,U 65.5 mg/dL   Microalb Creat Ratio 1.5 0.0 - 30.0 mg/g  Basic metabolic panel     Status: Abnormal   Collection Time: 05/24/14  9:00 AM  Result Value Ref Range   Sodium 133 (L) 135 - 145 mEq/L   Potassium 3.5 3.5 - 5.1 mEq/L   Chloride 97 96 - 112 mEq/L   CO2 26 19 - 32 mEq/L   Glucose, Bld 372 (H) 70 - 99 mg/dL   BUN 6 6 - 23 mg/dL   Creatinine, Ser 0.74 0.40 - 1.50 mg/dL   Calcium 9.4 8.4 - 10.5 mg/dL   GFR 152.56 >60.00 mL/min  Hepatic function panel     Status: Abnormal   Collection Time: 05/24/14  9:00 AM  Result Value Ref Range   Total Bilirubin 0.6  0.2 - 1.2 mg/dL   Bilirubin, Direct 0.1 0.0 - 0.3 mg/dL   Alkaline Phosphatase 136 (H) 39 - 117 U/L   AST 15 0 - 37 U/L   ALT 22 0 - 53 U/L   Total Protein 7.4 6.0 - 8.3 g/dL   Albumin 4.2 3.5 - 5.2 g/dL  Urine Culture     Status: None   Collection Time: 05/24/14  9:00 AM  Result Value Ref Range   Colony Count NO GROWTH    Organism ID, Bacteria NO GROWTH   LDL cholesterol, direct     Status: None   Collection Time: 05/24/14  9:00 AM  Result Value Ref Range   Direct  LDL 99.0 mg/dL    Comment: Optimal:  <100 mg/dLNear or Above Optimal:  100-129 mg/dLBorderline High:  130-159 mg/dLHigh:  160-189 mg/dLVery High:  >190 mg/dL  HM DIABETES EYE EXAM     Status: None   Collection Time: 07/06/14  4:05 PM  Result Value Ref Range   HM Diabetic Eye Exam  No Retinopathy  Basic metabolic panel     Status: Abnormal   Collection Time: 07/20/14 11:34 AM  Result Value Ref Range   Sodium 134 (L) 135 - 145 mEq/L   Potassium 4.0 3.5 - 5.1 mEq/L   Chloride 99 96 - 112 mEq/L   CO2 26 19 - 32 mEq/L   Glucose, Bld 260 (H) 70 - 99 mg/dL   BUN 12 6 - 23 mg/dL   Creatinine, Ser 0.94 0.40 - 1.50 mg/dL   Calcium 9.7 8.4 - 10.5 mg/dL   GFR 115.66 >60.00 mL/min    Assessment/Plan: Balanitis Seems mostly yeast related.  Moderate inflammation.  Rx Diflucan. Encouraged OTC cortisone.  NSAIDs.  Hygiene measures reviewed.  Will obtain urine gc/chalmydia, RPR and ancillary testing for trich/bv giving lesion.

## 2014-08-05 NOTE — Addendum Note (Signed)
Addended by: Rockwell Germany on: 08/05/2014 03:42 PM   Modules accepted: Orders

## 2014-08-06 LAB — GC/CHLAMYDIA PROBE AMP, URINE
Chlamydia, Swab/Urine, PCR: NEGATIVE
GC Probe Amp, Urine: NEGATIVE

## 2014-08-06 LAB — RPR

## 2014-08-08 LAB — WOUND CULTURE: GRAM STAIN: NONE SEEN

## 2014-08-09 LAB — HSV(HERPES SMPLX)ABS-I+II(IGG+IGM)-BLD
HERPES SIMPLEX VRS I-IGM AB (EIA): 2.38 {index} — AB
HSV 1 Glycoprotein G Ab, IgG: <0.1 IV
HSV 2 Glycoprotein G Ab, IgG: 8.98 IV — ABNORMAL HIGH

## 2014-08-11 ENCOUNTER — Telehealth: Payer: Self-pay | Admitting: Physician Assistant

## 2014-08-11 MED ORDER — VALACYCLOVIR HCL 500 MG PO TABS
500.0000 mg | ORAL_TABLET | Freq: Every day | ORAL | Status: DC
Start: 2014-08-11 — End: 2014-11-09

## 2014-08-11 NOTE — Telephone Encounter (Signed)
Rx sent 

## 2014-08-11 NOTE — Telephone Encounter (Signed)
-----   Message from Peggyann Shoals, RMA sent at 08/10/2014 12:22 PM EDT ----- Pt agrees to have RX sent to [facility].

## 2014-08-24 ENCOUNTER — Ambulatory Visit: Payer: 59 | Admitting: Family

## 2014-08-24 DIAGNOSIS — Z0289 Encounter for other administrative examinations: Secondary | ICD-10-CM

## 2014-10-17 ENCOUNTER — Other Ambulatory Visit: Payer: Self-pay | Admitting: Family

## 2014-10-17 NOTE — Telephone Encounter (Signed)
30 day supply Protonix sent to pharmacy.  Pt is past due for follow up. Please call pt to arrange appt. Thanks!

## 2014-10-17 NOTE — Telephone Encounter (Signed)
Called pt to schedule appt. He declined scheduling at this time and states he will call back.

## 2014-11-03 ENCOUNTER — Other Ambulatory Visit: Payer: Self-pay | Admitting: Family

## 2014-11-03 NOTE — Telephone Encounter (Signed)
Scheduled appt for 11/09/14 1:15pm

## 2014-11-03 NOTE — Telephone Encounter (Signed)
30 day supply of lisinopril and invokamet sent to pharmacy.  Pt was due for follow up in May and is past due. Please call pt to schedule f/u as further refills may not be given until he is seen.  Thanks!

## 2014-11-09 ENCOUNTER — Ambulatory Visit (INDEPENDENT_AMBULATORY_CARE_PROVIDER_SITE_OTHER): Payer: 59 | Admitting: Family

## 2014-11-09 VITALS — BP 140/82 | HR 97 | Temp 98.1°F | Resp 18 | Ht 72.0 in | Wt 219.2 lb

## 2014-11-09 DIAGNOSIS — IMO0002 Reserved for concepts with insufficient information to code with codable children: Secondary | ICD-10-CM

## 2014-11-09 DIAGNOSIS — E785 Hyperlipidemia, unspecified: Secondary | ICD-10-CM | POA: Diagnosis not present

## 2014-11-09 DIAGNOSIS — E1165 Type 2 diabetes mellitus with hyperglycemia: Secondary | ICD-10-CM | POA: Diagnosis not present

## 2014-11-09 DIAGNOSIS — K219 Gastro-esophageal reflux disease without esophagitis: Secondary | ICD-10-CM

## 2014-11-09 LAB — BASIC METABOLIC PANEL
BUN: 11 mg/dL (ref 6–23)
CALCIUM: 9.6 mg/dL (ref 8.4–10.5)
CHLORIDE: 94 meq/L — AB (ref 96–112)
CO2: 23 mEq/L (ref 19–32)
Creatinine, Ser: 1.15 mg/dL (ref 0.40–1.50)
GFR: 91.5 mL/min (ref >60.00)
Glucose, Bld: 400 mg/dL — ABNORMAL HIGH (ref 70–99)
Potassium: 3.9 mEq/L (ref 3.5–5.1)
SODIUM: 132 meq/L — AB (ref 135–145)

## 2014-11-09 LAB — LIPID PANEL
Cholesterol: 233 mg/dL — ABNORMAL HIGH (ref 0–200)
HDL: 52 mg/dL (ref >39.00)
NonHDL: 181
TRIGLYCERIDES: 280 mg/dL — AB (ref 0.0–149.0)
Total CHOL/HDL Ratio: 4
VLDL: 56 mg/dL — ABNORMAL HIGH (ref 0.0–40.0)

## 2014-11-09 LAB — LDL CHOLESTEROL, DIRECT: LDL DIRECT: 152 mg/dL

## 2014-11-09 LAB — HEMOGLOBIN A1C: HEMOGLOBIN A1C: 10 % — AB (ref 4.6–6.5)

## 2014-11-09 MED ORDER — LISINOPRIL 20 MG PO TABS: 20.0000 mg | ORAL_TABLET | Freq: Every day | ORAL | Status: DC

## 2014-11-09 MED ORDER — PANTOPRAZOLE SODIUM 40 MG PO TBEC: DELAYED_RELEASE_TABLET | ORAL | Status: DC

## 2014-11-09 MED ORDER — CANAGLIFLOZIN-METFORMIN HCL 50-500 MG PO TABS: ORAL_TABLET | ORAL | Status: DC

## 2014-11-09 MED ORDER — ASPIRIN EC 81 MG PO TBEC: 81.0000 mg | DELAYED_RELEASE_TABLET | Freq: Every day | ORAL | Status: DC

## 2014-11-09 MED ORDER — VALACYCLOVIR HCL 500 MG PO TABS: 500.0000 mg | ORAL_TABLET | Freq: Every day | ORAL | Status: DC

## 2014-11-09 MED ORDER — TADALAFIL 10 MG PO TABS: 10.0000 mg | ORAL_TABLET | Freq: Every day | ORAL | Status: DC | PRN

## 2014-11-09 NOTE — Progress Notes (Signed)
Pre visit review using our clinic review tool, if applicable. No additional management support is needed unless otherwise documented below in the visit note. 

## 2014-11-09 NOTE — Progress Notes (Signed)
Subjective:    Patient ID: Willie [name], male    DOB: 1975/09/20, 39 y.o.   MRN: [id number]  HPI  Willie [name] is a 39 yr old male who presents today for follow up.    Depression/anxiety- good moods, sleeping well.  No anxiety.   HTN-  Patient is currently maintained on the following medications for blood pressure: lisinopril 20mg .  Patient reports good compliance with blood pressure medications. Patient denies chest pain, shortness of breath or swelling. Last 3 blood pressure readings in our office are as follows:  BP Readings from Last 3 Encounters:  11/09/14 140/82  08/05/14 133/99  06/27/14 120/86    DM2- not checking sugars. Reports that he feels thirsty.  Currently on invokamet.  Lab Results  Component Value Date   HGBA1C 15.0* 05/24/2014   HGBA1C 7.6* 08/04/2012   HGBA1C 7.2* 03/24/2012   Lab Results  Component Value Date   MICROALBUR 1.0 05/24/2014   LDLCALC 89 11/13/2010   CREATININE 0.94 07/20/2014   Continues protonix, reports gerd is better.  He has changed his diet which has helped.    Penile rash- resolved.  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     Review of Systems    see HPI  Past Medical History  Diagnosis Date  . Alcoholic pancreatitis     recurrent  . Diabetes mellitus, type II     New Onset 03/2010  . Hypertension   . 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 High cholesterol   . Sleep apnea   . GERD (gastroesophageal reflux disease)   . Depression     History   Social History  . Marital Status: Legally Separated    Spouse Name: N/A  . Number of Children: 2  . 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 Smoking status: Never Smoker   . Smokeless tobacco: Never Used  . Alcohol Use: No     Comment: last drink 4 days ago  . Drug Use: No  . Sexual Activity: Not on file    Other Topics Concern  . 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 Lumbar microdiscectomy  ~ 2004    Dr Ellene Route    Family History  Problem Relation Age of Onset  . Diabetes Mother   . Lung cancer Brother     twin brother  . Colon cancer Neg Hx   . Pancreatic cancer Paternal Aunt     No Known Allergies  Current Outpatient Prescriptions on File Prior to Visit  Medication Sig Dispense Refill  . fluconazole (DIFLUCAN) 150 MG tablet Take 1 tablet (150 mg total) by mouth once. 1 tablet 0  . glucose blood test strip Use as instructed TO TEST BLOOD GLUCOSE TWICE DAILY WITH ONETOUCH DX: E11.9 100 each 5  . Lancets (ONETOUCH ULTRASOFT) lancets Use as instructed TO TEST BLOOD GLUCOSE TWICE DAILY DX: E11.9 100 each 5   No current facility-administered medications on file prior to visit.  BP 140/82 mmHg  Pulse 97  Temp(Src) 98.1 F (36.7 C) (Oral)  Resp 18  Ht 6' (1.829 m)  Wt 219 lb 3.2 oz (99.428 kg)  BMI 29.72 kg/m2  SpO2 97%    Objective:   Physical Exam  Constitutional: He is oriented to person, place, and time. He appears well-developed and well-nourished. No distress.  HENT:  Head: Normocephalic and atraumatic.  Cardiovascular: Normal rate and regular rhythm.   No murmur heard. Pulmonary/Chest: Effort normal and breath sounds normal. No respiratory distress. He has no wheezes. He has no rales.  Musculoskeletal: He exhibits no edema.  Lymphadenopathy:    He has no cervical adenopathy.  Neurological: He is alert and oriented to person, place, and time.  Skin: Skin is warm and dry.  Psychiatric: He has a normal mood and affect. His behavior is normal. Thought content normal.          Assessment & Plan:

## 2014-11-09 NOTE — Patient Instructions (Signed)
Please complete lab work prior to leaving. Add aspirin 81 mg once daily for heart protection. Increase lisinopril from 10mg  to 20mg  once daily. Follow up in 1 month.

## 2014-11-10 ENCOUNTER — Telehealth: Payer: Self-pay | Admitting: Family

## 2014-11-10 DIAGNOSIS — E785 Hyperlipidemia, unspecified: Secondary | ICD-10-CM

## 2014-11-10 MED ORDER — GLUCOSE BLOOD VI STRP: ORAL_STRIP | Status: DC

## 2014-11-10 MED ORDER — INSULIN PEN NEEDLE 32G X 4 MM MISC: Status: DC

## 2014-11-10 MED ORDER — SITAGLIPTIN PHOSPHATE 100 MG PO TABS: 100.0000 mg | ORAL_TABLET | Freq: Every day | ORAL | Status: DC

## 2014-11-10 MED ORDER — INSULIN GLARGINE 100 UNITS/ML SOLOSTAR PEN: 10.0000 [IU] | PEN_INJECTOR | Freq: Every day | SUBCUTANEOUS | Status: DC

## 2014-11-10 MED ORDER — ATORVASTATIN CALCIUM 20 MG PO TABS: 20.0000 mg | ORAL_TABLET | Freq: Every day | ORAL | Status: DC

## 2014-11-10 NOTE — Telephone Encounter (Signed)
Please advise pt that his sugar remains very uncontrolled. At this point I would recommend that he add lantus insulin 10 units once daily.  Please send rx for solostar pen + Pen needles.  His wife is an Therapist, sports and should be able to help him with injections.  Check sugar BID and call me in 1 week with blood sugar readings please.   Cholesterol/triglycerides are also elevated. Start atovastatin 20mg . Repeat flp in 3 months, dx hyperlipidemia. Please work on avoiding concentrated sweets, and limiting white carbs (rice/bread/pasta/potatoes). Instead substitute whole grain versions with reasonable portions.

## 2014-11-10 NOTE — Telephone Encounter (Signed)
Notified pt and he voices understanding.  He will have his wife assist him with insulin injections.  Pt states he could not afford onetouch test strips. Rxs sent to pharmacy for true metrix meter, test strips and lancets as well as lantus. Scheduled pt 1 month f/u with PCP for 12/13/14 and lab appt for 02/14/15. Future lab order entered.

## 2014-11-11 ENCOUNTER — Telehealth: Payer: Self-pay | Admitting: Behavioral Health

## 2014-11-11 MED ORDER — INSULIN GLARGINE 300 UNIT/ML ~~LOC~~ SOPN: 10.0000 [IU] | PEN_INJECTOR | Freq: Every day | SUBCUTANEOUS | Status: DC

## 2014-11-11 NOTE — Telephone Encounter (Signed)
Spoke with [facility] in pharmacy at [facility] in regards to Lantus medication being too expensive for the  patient. It was recommended from pharmacy that the patient take Toujeo instead of Lantus. Writer informed Dr. Birdie Riddle of this medication concern. Per Dr. Birdie Riddle, verbal order was given for 10 units of Toujeo daily, issuing 5 pens and  patient is to follow-up with his provider. Patient made aware of the medication change and understood the instructions given. No further questions or concerns at the end of call.

## 2014-11-11 NOTE — Telephone Encounter (Signed)
Spoke with patient.  States questions have been answered.  No further questions or concerns at this time.

## 2014-11-11 NOTE — Telephone Encounter (Signed)
Please call patient. He has questions regarding insulin medication. Best # [phone]

## 2014-11-12 ENCOUNTER — Encounter: Payer: Self-pay | Admitting: Family

## 2014-11-12 NOTE — Assessment & Plan Note (Signed)
Improved, continue PPI 

## 2014-11-12 NOTE — Assessment & Plan Note (Addendum)
Lab Results  Component Value Date   HGBA1C 10.0* 11/09/2014   Follow up A1C has improved significantly, but is still above goal.  Attempted to add lantus, but Tuojeo is less expensive for him. See phone note. Add asa for cardiac protection.

## 2014-11-13 ENCOUNTER — Encounter: Payer: Self-pay | Admitting: Family

## 2014-11-14 ENCOUNTER — Telehealth: Payer: Self-pay | Admitting: *Deleted

## 2014-11-14 ENCOUNTER — Telehealth: Payer: Self-pay | Admitting: Family

## 2014-11-14 MED ORDER — CITALOPRAM HYDROBROMIDE 20 MG PO TABS: ORAL_TABLET | ORAL | Status: DC

## 2014-11-14 NOTE — Telephone Encounter (Signed)
Received fax from [facility] that Lantus will cost pt $75 and he will be unable to afford that. Pharmacy states Toujeo copay card will lower cost to $15 if that is an acceptable alternative?  Please advise.

## 2014-11-14 NOTE — Telephone Encounter (Signed)
OK, dosing is the same.

## 2014-11-14 NOTE — Telephone Encounter (Signed)
Please let pt know that I reviewed his mychart message re: anxiety.  I doubt that is is med related.  I will send rx for citalopram which he has been on in the past to his pharmacy. I will plan to see him back on 8/16 as scheduled.

## 2014-11-14 NOTE — Telephone Encounter (Signed)
See phone note of 11/11/14 re: Toujeo. Notified pt of below recommendation and he voices understanding and is agreeable to restart citalopram.

## 2014-11-14 NOTE — Telephone Encounter (Signed)
See phone note

## 2014-11-14 NOTE — Telephone Encounter (Signed)
Noted, see other phone note

## 2014-11-14 NOTE — Telephone Encounter (Signed)
Relation to pt: sef Call back number: [phone]   Reason for call:  Patient states every since he started new cholesterol medication he has experience anxiety. Patient in need of clinical advice does not know the name of cholesterol medication

## 2014-11-17 ENCOUNTER — Emergency Department (HOSPITAL_BASED_OUTPATIENT_CLINIC_OR_DEPARTMENT_OTHER): Payer: 59

## 2014-11-17 ENCOUNTER — Encounter (HOSPITAL_BASED_OUTPATIENT_CLINIC_OR_DEPARTMENT_OTHER): Payer: Self-pay | Admitting: *Deleted

## 2014-11-17 ENCOUNTER — Emergency Department (HOSPITAL_BASED_OUTPATIENT_CLINIC_OR_DEPARTMENT_OTHER)
Admission: EM | Admit: 2014-11-17 | Discharge: 2014-11-17 | Disposition: A | Discharge status: Home / Self Care | Payer: 59 | Attending: Emergency Medicine | Admitting: Emergency Medicine

## 2014-11-17 DIAGNOSIS — F329 Major depressive disorder, single episode, unspecified: Secondary | ICD-10-CM | POA: Insufficient documentation

## 2014-11-17 DIAGNOSIS — Z7982 Long term (current) use of aspirin: Secondary | ICD-10-CM | POA: Insufficient documentation

## 2014-11-17 DIAGNOSIS — E872 Acidosis, unspecified: Secondary | ICD-10-CM

## 2014-11-17 DIAGNOSIS — E780 Pure hypercholesterolemia: Secondary | ICD-10-CM | POA: Insufficient documentation

## 2014-11-17 DIAGNOSIS — K219 Gastro-esophageal reflux disease without esophagitis: Secondary | ICD-10-CM | POA: Diagnosis not present

## 2014-11-17 DIAGNOSIS — I10 Essential (primary) hypertension: Secondary | ICD-10-CM | POA: Insufficient documentation

## 2014-11-17 DIAGNOSIS — Z79899 Other long term (current) drug therapy: Secondary | ICD-10-CM | POA: Insufficient documentation

## 2014-11-17 DIAGNOSIS — Z8669 Personal history of other diseases of the nervous system and sense organs: Secondary | ICD-10-CM | POA: Insufficient documentation

## 2014-11-17 DIAGNOSIS — E8729 Other acidosis: Secondary | ICD-10-CM

## 2014-11-17 DIAGNOSIS — R000 Tachycardia, unspecified: Secondary | ICD-10-CM | POA: Diagnosis not present

## 2014-11-17 DIAGNOSIS — Z794 Long term (current) use of insulin: Secondary | ICD-10-CM | POA: Diagnosis not present

## 2014-11-17 DIAGNOSIS — R079 Chest pain, unspecified: Secondary | ICD-10-CM

## 2014-11-17 DIAGNOSIS — E119 Type 2 diabetes mellitus without complications: Secondary | ICD-10-CM | POA: Diagnosis not present

## 2014-11-17 LAB — BASIC METABOLIC PANEL
Anion gap: 21 — ABNORMAL HIGH (ref 5–15)
Anion gap: 15 (ref 5–15)
BUN: 21 mg/dL — ABNORMAL HIGH (ref 6–20)
BUN: 16 mg/dL (ref 6–20)
CO2: 16 mmol/L — ABNORMAL LOW (ref 22–32)
CO2: 16 mmol/L — ABNORMAL LOW (ref 22–32)
Calcium: 8.8 mg/dL — ABNORMAL LOW (ref 8.9–10.3)
Calcium: 7.7 mg/dL — ABNORMAL LOW (ref 8.9–10.3)
Chloride: 99 mmol/L — ABNORMAL LOW (ref 101–111)
Chloride: 104 mmol/L (ref 101–111)
Creatinine, Ser: 1.07 mg/dL (ref 0.61–1.24)
Creatinine, Ser: 0.85 mg/dL (ref 0.61–1.24)
GFR calc Af Amer: >60 mL/min (ref >60)
GFR calc Af Amer: >60 mL/min (ref >60)
GFR calc non Af Amer: >60 mL/min (ref >60)
GFR calc non Af Amer: >60 mL/min (ref >60)
Glucose, Bld: 103 mg/dL — ABNORMAL HIGH (ref 65–99)
Glucose, Bld: 84 mg/dL (ref 65–99)
Potassium: 3.8 mmol/L (ref 3.5–5.1)
Potassium: 4.4 mmol/L (ref 3.5–5.1)
Sodium: 136 mmol/L (ref 135–145)
Sodium: 135 mmol/L (ref 135–145)

## 2014-11-17 LAB — URINALYSIS, ROUTINE W REFLEX MICROSCOPIC
Bilirubin Urine: NEGATIVE
Glucose, UA: >1000 mg/dL — AB
Hgb urine dipstick: NEGATIVE
Ketones, ur: 40 mg/dL — AB
Leukocytes, UA: NEGATIVE
Nitrite: NEGATIVE
Protein, ur: NEGATIVE mg/dL
Specific Gravity, Urine: 1.022 (ref 1.005–1.030)
Urobilinogen, UA: 0.2 mg/dL (ref 0.0–1.0)
pH: 5 (ref 5.0–8.0)

## 2014-11-17 LAB — CBC WITH DIFFERENTIAL/PLATELET
Basophils Absolute: 0.1 10*3/uL (ref 0.0–0.1)
Basophils Relative: 1 % (ref 0–1)
Eosinophils Absolute: 0 10*3/uL (ref 0.0–0.7)
Eosinophils Relative: 0 % (ref 0–5)
HCT: 44.2 % (ref 39.0–52.0)
Hemoglobin: 15.5 g/dL (ref 13.0–17.0)
Lymphocytes Relative: 10 % — ABNORMAL LOW (ref 12–46)
Lymphs Abs: 0.9 10*3/uL (ref 0.7–4.0)
MCH: 30.8 pg (ref 26.0–34.0)
MCHC: 35.1 g/dL (ref 30.0–36.0)
MCV: 87.7 fL (ref 78.0–100.0)
Monocytes Absolute: 0.3 10*3/uL (ref 0.1–1.0)
Monocytes Relative: 3 % (ref 3–12)
Neutro Abs: 7.9 10*3/uL — ABNORMAL HIGH (ref 1.7–7.7)
Neutrophils Relative %: 86 % — ABNORMAL HIGH (ref 43–77)
Platelets: 231 10*3/uL (ref 150–400)
RBC: 5.04 MIL/uL (ref 4.22–5.81)
RDW: 13.9 % (ref 11.5–15.5)
WBC: 9.1 10*3/uL (ref 4.0–10.5)

## 2014-11-17 LAB — URINE MICROSCOPIC-ADD ON

## 2014-11-17 LAB — I-STAT CG4 LACTIC ACID, ED
Lactic Acid, Venous: 6.02 mmol/L (ref 0.5–2.0)
Lactic Acid, Venous: 3.95 mmol/L (ref 0.5–2.0)

## 2014-11-17 LAB — CBG MONITORING, ED: Glucose-Capillary: 88 mg/dL (ref 65–99)

## 2014-11-17 LAB — TROPONIN I: Troponin I: <0.03 ng/mL (ref <0.031)

## 2014-11-17 MED ORDER — SODIUM CHLORIDE 0.9 % IV BOLUS (SEPSIS)
2000.0000 mL | Freq: Once | INTRAVENOUS | Status: AC
Start: 2014-11-17 — End: 2014-11-17
  Administered 2014-11-17: 2000 mL via INTRAVENOUS

## 2014-11-17 MED ORDER — LORAZEPAM 1 MG PO TABS
1.0000 mg | ORAL_TABLET | Freq: Once | ORAL | Status: AC
  Administered 2014-11-17: 1 mg via ORAL
  Filled 2014-11-17: qty 1

## 2014-11-17 MED ORDER — SODIUM CHLORIDE 0.9 % IV BOLUS (SEPSIS)
1000.0000 mL | Freq: Once | INTRAVENOUS | Status: AC
  Administered 2014-11-17: 1000 mL via INTRAVENOUS

## 2014-11-17 NOTE — ED Notes (Signed)
MD at bedside. 

## 2014-11-17 NOTE — ED Provider Notes (Addendum)
CSN: [id number]     Arrival date & time 11/17/14  1052 History   First MD Initiated Contact with Patient 11/17/14 1057     Chief Complaint  Patient presents with  . Chest Pain     (Consider location/radiation/quality/duration/timing/severity/associated sxs/prior Treatment) HPI   39yM with palpitations and feeling of nervousness. Weird sensation/tightness across his chest. Not really pain, but feels like something isn't right. Onset about a week ago. Progressively worsening since then. Started new medications about the same time. He was started on invokana/metformin. He had previously taken metformin prior to this. He was also started on Lipitor. Patient describes sensation of feeling nervous. Particularly worse when driving on the highway and has had to pull over. He also expressed some fears about going over bridges/over passes. Has never felt like this before. Denies new stressors. Reports called PCP about this and prescribed celexa but hasn't started taking this yet. No fever or chills. No n/v. No urinary complaints. Past history of ETOH abuse and alcohol induced pancreatitis. Pt reports had 2-3 drinks 3 days ago and last drink prior to this was about a week before. He hasn't felt like this previously when stopped drinking.  Also concerned about being started on new diabetic medication and has made an effort to reduce caloric intake, particularly from simple sugars.   Past Medical History  Diagnosis Date  . Alcoholic pancreatitis     recurrent  . Diabetes mellitus, type II     New Onset 03/2010  . Hypertension   . 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 High cholesterol   . Sleep apnea   . GERD (gastroesophageal reflux disease)   . Depression    Past Surgical History  Procedure Laterality Date  . Lumbar microdiscectomy  ~ 2004    Dr Ellene Route   Family History  Problem Relation Age of Onset  . Diabetes Mother   . Lung cancer Brother      twin brother  . Colon cancer Neg Hx   . Pancreatic cancer Paternal Aunt    History  Substance Use Topics  . Smoking status: Never Smoker   . Smokeless tobacco: Never Used  . Alcohol Use: Yes     Comment: last drink on 11/15/14    Review of Systems  All systems reviewed and negative, other than as noted in HPI.   Allergies  Review of patient's allergies indicates no known allergies.  Home Medications   Prior to Admission medications   Medication Sig Start Date End Date Taking? Authorizing Provider  aspirin EC 81 MG tablet Take 1 tablet (81 mg total) by mouth daily. 11/09/14  Yes Debbrah Alar, NP  atorvastatin (LIPITOR) 20 MG tablet Take 1 tablet (20 mg total) by mouth daily. 11/10/14  Yes Debbrah Alar, NP  Canagliflozin-Metformin HCl (INVOKAMET) 50-500 MG TABS TAKE 1 TABLET BY MOUTH 2 (TWO) TIMES DAILY. 11/09/14  Yes Debbrah Alar, NP  citalopram (CELEXA) 20 MG tablet 1/2 tab by mouth once daily for 1 week, then increase to a full tab once daily on week two. 11/14/14  Yes Debbrah Alar, NP  glucose blood (TRUE METRIX BLOOD GLUCOSE TEST) test strip Use as instructed 11/10/14  Yes Debbrah Alar, NP  Insulin Glargine (TOUJEO SOLOSTAR) 300 UNIT/ML SOPN Inject 10 Units into the skin daily. 11/11/14  Yes Midge Minium, MD  Insulin Pen Needle 32G X 4 MM MISC Use to administer lantus injection daily. 11/10/14  Yes Debbrah Alar, NP  Lancets (ONETOUCH ULTRASOFT) lancets Use as instructed TO TEST BLOOD GLUCOSE TWICE DAILY DX: E11.9 05/27/14  Yes Debbrah Alar, NP  lisinopril (PRINIVIL,ZESTRIL) 20 MG tablet Take 1 tablet (20 mg total) by mouth daily. 11/09/14  Yes Debbrah Alar, NP  pantoprazole (PROTONIX) 40 MG tablet TAKE 1 TABLET (40 MG TOTAL) BY MOUTH DAILY. 11/09/14  Yes Debbrah Alar, NP  tadalafil (CIALIS) 10 MG tablet Take 1 tablet (10 mg total) by mouth daily as needed for erectile dysfunction. 11/09/14  Yes Debbrah Alar, NP   valACYclovir (VALTREX) 500 MG tablet Take 1 tablet (500 mg total) by mouth daily. 11/09/14  Yes Debbrah Alar, NP   BP 127/73 mmHg  Pulse 95  Temp(Src) 97.5 F (36.4 C) (Oral)  Resp 14  Ht 6' (1.829 m)  Wt 216 lb (97.977 kg)  BMI 29.29 kg/m2  SpO2 100% Physical Exam  Constitutional: He appears well-developed and well-nourished. No distress.  HENT:  Head: Normocephalic and atraumatic.  Eyes: Conjunctivae are normal. Right eye exhibits no discharge. Left eye exhibits no discharge.  Neck: Neck supple.  Cardiovascular: Regular rhythm and normal heart sounds.  Exam reveals no gallop and no friction rub.   No murmur heard. Mild tachycardia  Pulmonary/Chest: Effort normal and breath sounds normal. No respiratory distress.  Abdominal: Soft. He exhibits no distension. There is no tenderness.  Musculoskeletal: He exhibits no edema or tenderness.  Neurological: He is alert.  Skin: Skin is warm and dry.  Psychiatric: He has a normal mood and affect. His behavior is normal. Thought content normal.  Nursing note and vitals reviewed.   ED Course  Procedures (including critical care time) Labs Review Labs Reviewed  CBC WITH DIFFERENTIAL/PLATELET - Abnormal; Notable for the following:    Neutrophils Relative % 86 (*)    Neutro Abs 7.9 (*)    Lymphocytes Relative 10 (*)    All other components within normal limits  BASIC METABOLIC PANEL - Abnormal; Notable for the following:    Chloride 99 (*)    CO2 16 (*)    Glucose, Bld 103 (*)    BUN 21 (*)    Calcium 8.8 (*)    Anion gap 21 (*)    All other components within normal limits  URINALYSIS, ROUTINE W REFLEX MICROSCOPIC (NOT AT [facility]) - Abnormal; Notable for the following:    Glucose, UA >1000 (*)    Ketones, ur 40 (*)    All other components within normal limits  BASIC METABOLIC PANEL - Abnormal; Notable for the following:    CO2 16 (*)    Calcium 7.7 (*)    All other components within normal limits  I-STAT CG4 LACTIC ACID, ED  - Abnormal; Notable for the following:    Lactic Acid, Venous 6.02 (*)    All other components within normal limits  I-STAT CG4 LACTIC ACID, ED - Abnormal; Notable for the following:    Lactic Acid, Venous 3.95 (*)    All other components within normal limits  TROPONIN I  URINE MICROSCOPIC-ADD ON  LACTIC ACID, PLASMA  CBG MONITORING, ED    Imaging Review Dg Chest 2 View  11/17/2014   CLINICAL DATA:  Left anterior chest pain and arm pain and tingling since last night.  EXAM: CHEST  2 VIEW  COMPARISON:  11/16/2012.  FINDINGS: Normal sized heart. Clear lungs. Normal vascularity. Minimal thoracic spine degenerative changes.  IMPRESSION: No acute abnormality.   Electronically Signed   By: Percell Locus.D.  On: 11/17/2014 11:51     EKG Interpretation   Date/Time:  Thursday November 17 2014 10:57:27 EDT Ventricular Rate:  102 PR Interval:  172 QRS Duration: 102 QT Interval:  372 QTC Calculation: 484 R Axis:   37 Text Interpretation:  Sinus tachycardia Otherwise normal ECG No  significant change since last tracing Confirmed by Issachar Broady  MD, Maansi Wike  ([id number]) on 11/17/2014 10:58:34 AM      MDM   Final diagnoses:  Chest pain  Metabolic acidosis, increased anion gap  Lactic acidosis    39 year old male with palpitations. Resting HR around 100. W/u significant for anion gap metabolic acidosis. Lactic acid subsequently significantly elevated at 6. Afebrile. Nontoxic. Not significantly hyperglycemic.  Suspect may be medication related.  Pt on invokana/metformin.  Symptoms began shortly after initiating this. SGLT2 inhibitors can potentially cause ketoacidosis with only mildly elevated glucose levels Of note, pt had been on metformin only for quite some time prior to this w/o apparent issue though. Pt also reports also recently making concerted effort to reduced caloric intake. I don't have a great explanation otherwise and I'm sure his symptoms are related to this, at least to some degree. Some  more consistent with anxiety though, particularly feeling tense/fearful when driving.  Consider ETOH withdrawal, but pt denies previous similar symptoms.   Pt hesitant to be hospitalized. Children at home and wife out of town. Has medical decision making capability. Understands my concerns and benefits of hospitalization.   He does not look toxic. Alert and nonfocal neuro exam. Drinking water in ED w/o complaint. Reports no recent n/v. Renal function ok. He is agreeable for placement of IV, IVF and repeat labs.  Will reassess. Disposition pending repeat labs and further discussion.    Lactic acid has improved, but still significantly elevated. Bicarb remains 16, but improvement of anion gap. Again recommending admission to patient but he says he just can't stay. Will have him stop invokana/metformin. Continue insulin. Push fluids. Needs re-evaluation and immediate ED return for worsening symptoms.   Virgel Manifold, MD 11/17/14 1543  Discussed with office upstairs. [name] currently out of the office but arranged appointment with her for 1045 tomorrow.   Virgel Manifold, MD 11/17/14 [phone]

## 2014-11-17 NOTE — ED Notes (Signed)
Pt reports left chest pain with "heart racing" since yesterday afternoon- also c/o lightheadedness- states started on insulin and new cholesterol meds last week- states he has been having panic attacks when driving since new medications-

## 2014-11-17 NOTE — ED Notes (Signed)
Critical Value Lactic Acid 6.02 reported to Dr. Wilson Singer.

## 2014-11-17 NOTE — ED Notes (Signed)
Critical value lactic acid of 3.95 reported to Dr. Wilson Singer.

## 2014-11-17 NOTE — ED Notes (Addendum)
MD at bedside. Notified of lactic acid level

## 2014-11-18 ENCOUNTER — Encounter: Payer: Self-pay | Admitting: Family

## 2014-11-18 ENCOUNTER — Ambulatory Visit (INDEPENDENT_AMBULATORY_CARE_PROVIDER_SITE_OTHER): Payer: 59 | Admitting: Family

## 2014-11-18 VITALS — BP 130/86 | HR 98 | Temp 98.1°F | Resp 18 | Ht 72.0 in | Wt 217.0 lb

## 2014-11-18 DIAGNOSIS — IMO0002 Reserved for concepts with insufficient information to code with codable children: Secondary | ICD-10-CM

## 2014-11-18 DIAGNOSIS — E872 Acidosis, unspecified: Secondary | ICD-10-CM

## 2014-11-18 DIAGNOSIS — F418 Other specified anxiety disorders: Secondary | ICD-10-CM

## 2014-11-18 DIAGNOSIS — E876 Hypokalemia: Secondary | ICD-10-CM

## 2014-11-18 DIAGNOSIS — E1165 Type 2 diabetes mellitus with hyperglycemia: Secondary | ICD-10-CM

## 2014-11-18 LAB — COMPREHENSIVE METABOLIC PANEL
ALK PHOS: 66 U/L (ref 39–117)
ALT: 47 U/L (ref 0–53)
AST: 63 U/L — ABNORMAL HIGH (ref 0–37)
Albumin: 4.4 g/dL (ref 3.5–5.2)
BILIRUBIN TOTAL: 0.7 mg/dL (ref 0.2–1.2)
BUN: 9 mg/dL (ref 6–23)
CO2: 24 mEq/L (ref 19–32)
Calcium: 9.2 mg/dL (ref 8.4–10.5)
Chloride: 102 mEq/L (ref 96–112)
Creatinine, Ser: 0.87 mg/dL (ref 0.40–1.50)
GFR: 126.24 mL/min (ref >60.00)
GLUCOSE: 133 mg/dL — AB (ref 70–99)
Potassium: 3.4 mEq/L — ABNORMAL LOW (ref 3.5–5.1)
SODIUM: 140 meq/L (ref 135–145)
Total Protein: 7.2 g/dL (ref 6.0–8.3)

## 2014-11-18 LAB — CBC WITH DIFFERENTIAL/PLATELET
BASOS ABS: 0 10*3/uL (ref 0.0–0.1)
Basophils Relative: 0.6 % (ref 0.0–3.0)
EOS PCT: 1.1 % (ref 0.0–5.0)
Eosinophils Absolute: 0.1 10*3/uL (ref 0.0–0.7)
HCT: 42.2 % (ref 39.0–52.0)
Hemoglobin: 14.5 g/dL (ref 13.0–17.0)
Lymphocytes Relative: 28 % (ref 12.0–46.0)
Lymphs Abs: 1.4 10*3/uL (ref 0.7–4.0)
MCHC: 34.4 g/dL (ref 30.0–36.0)
MCV: 88.9 fl (ref 78.0–100.0)
Monocytes Absolute: 0.3 10*3/uL (ref 0.1–1.0)
Monocytes Relative: 6.8 % (ref 3.0–12.0)
NEUTROS ABS: 3.1 10*3/uL (ref 1.4–7.7)
Neutrophils Relative %: 63.5 % (ref 43.0–77.0)
Platelets: 250 10*3/uL (ref 150.0–400.0)
RBC: 4.74 Mil/uL (ref 4.22–5.81)
RDW: 15 % (ref 11.5–15.5)
WBC: 4.9 10*3/uL (ref 4.0–10.5)

## 2014-11-18 LAB — LIPASE: Lipase: 7 U/L — ABNORMAL LOW (ref 11.0–59.0)

## 2014-11-18 MED ORDER — POTASSIUM CHLORIDE CRYS ER 20 MEQ PO TBCR: 40.0000 meq | EXTENDED_RELEASE_TABLET | Freq: Once | ORAL | Status: DC

## 2014-11-18 NOTE — Assessment & Plan Note (Signed)
DM is now better controlled.  Advised pt to monitor her blood sugars on the 10 units of Toujeo and call if any further sugars <80. Remain off of invokamet.

## 2014-11-18 NOTE — Assessment & Plan Note (Signed)
Anxiety has deteriorated. Advised pt to start citalopram.

## 2014-11-18 NOTE — Progress Notes (Signed)
Subjective:    Patient ID: Willie [name], male    DOB: 08-Mar-1976, 39 y.o.   MRN: [id number]  HPI  Mr. Debold is a 39 yr old male who presents today for ED follow up. He presented to the ED with c/o nervousness.  He was noted to have anion gap and elevated lactic acid level.  Glucose level was OK- 103.  Mild hypocalcemia.  Blood count was normal.  Tryponin normal.  He recently started Toujeo for his diabetes. Started 1 week ago. Reports that fasting sugar 119 today. Sugar last night was 68.  ED physician stopped invokamet. Denies abdominal pain, nausea/vomitting.    Anxiety- feels that his heart is racing. Reports + panic attacks.  Reports that he has panic attacks with driving only. Ok at home. Denies depression.     Review of Systems     Past Medical History  Diagnosis Date  . Alcoholic pancreatitis     recurrent  . Diabetes mellitus, type II     New Onset 03/2010  . Hypertension   . 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 High cholesterol   . Sleep apnea   . GERD (gastroesophageal reflux disease)   . Depression     History   Social History  . Marital Status: Legally Separated    Spouse Name: N/A  . Number of Children: 2  . 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 Smoking status: Never Smoker   . Smokeless tobacco: Never Used  . Alcohol Use: Yes     Comment: last drink on 11/15/14  . Drug Use: No  . Sexual Activity: Not on file   Other Topics Concern  . 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 Lumbar microdiscectomy  ~ 2004    Dr Ellene Route    Family History  Problem Relation Age  of Onset  . Diabetes Mother   . Lung cancer Brother     twin brother  . Colon cancer Neg Hx   . Pancreatic cancer Paternal Aunt     No Known Allergies  Current Outpatient Prescriptions on File Prior to Visit  Medication Sig Dispense Refill  . aspirin EC 81 MG tablet Take 1 tablet (81 mg total) by mouth daily.    Marland Kitchen atorvastatin (LIPITOR) 20 MG tablet Take 1 tablet (20 mg total) by mouth daily. 30 tablet 2  . citalopram (CELEXA) 20 MG tablet 1/2 tab by mouth once daily for 1 week, then increase to a full tab once daily on week two. 30 tablet 0  . glucose blood (TRUE METRIX BLOOD GLUCOSE TEST) test strip Use as instructed 180 each 1  . Insulin Glargine (TOUJEO SOLOSTAR) 300 UNIT/ML SOPN Inject 10 Units into the skin daily. 5 pen 0  . Insulin Pen Needle 32G X 4 MM MISC Use to administer lantus injection daily. 100 each 1  . Lancets (ONETOUCH ULTRASOFT) lancets Use as instructed TO TEST BLOOD GLUCOSE TWICE DAILY DX: E11.9 100 each  5  . lisinopril (PRINIVIL,ZESTRIL) 20 MG tablet Take 1 tablet (20 mg total) by mouth daily. 30 tablet 5  . pantoprazole (PROTONIX) 40 MG tablet TAKE 1 TABLET (40 MG TOTAL) BY MOUTH DAILY. 30 tablet 5  . tadalafil (CIALIS) 10 MG tablet Take 1 tablet (10 mg total) by mouth daily as needed for erectile dysfunction. 10 tablet 5  . valACYclovir (VALTREX) 500 MG tablet Take 1 tablet (500 mg total) by mouth daily. 30 tablet 5   No current facility-administered medications on file prior to visit.    BP 130/86 mmHg  Pulse 98  Temp(Src) 98.1 F (36.7 C) (Oral)  Resp 18  Ht 6' (1.829 m)  Wt 217 lb (98.431 kg)  BMI 29.42 kg/m2  SpO2 99%    Objective:   Physical Exam  Constitutional: He is oriented to person, place, and time. He appears well-developed and well-nourished. No distress.  HENT:  Head: Normocephalic and atraumatic.  Cardiovascular: Normal rate and regular rhythm.   No murmur heard. Pulmonary/Chest: Effort normal and breath sounds normal. No  respiratory distress. He has no wheezes. He has no rales.  Musculoskeletal: He exhibits no edema.  Lymphadenopathy:    He has no cervical adenopathy.  Neurological: He is alert and oriented to person, place, and time.  Skin: Skin is warm and dry.  Dry skin noted on face  Psychiatric: He has a normal mood and affect. His behavior is normal. Thought content normal.          Assessment & Plan:  Pt was counseled on importance of ETOH abstinence- had a few drinks a few days ago.

## 2014-11-18 NOTE — Progress Notes (Signed)
Pre visit review using our clinic review tool, if applicable. No additional management support is needed unless otherwise documented below in the visit note. 

## 2014-11-18 NOTE — Telephone Encounter (Signed)
Please make sure pt reads message re: potassium.  Could you please check status of lactic acid order?  I looks like it was cancelled.

## 2014-11-18 NOTE — Assessment & Plan Note (Signed)
Follow up bicarb is now normal. Await Lactic acid level. Was most likely related to invokamet +/- dehydration.

## 2014-11-18 NOTE — Patient Instructions (Signed)
Hydrate with at least 64 ounces of water today. Remain off of invokamet.  Continue Toujeo- call me if you develop recurrent sugar < 80. Start citalopram for anxiety. Go to the ER if you develop weakness, nausea/vomiting, fever or if you begin to feel badly.  Follow up in 1 week.

## 2014-11-21 NOTE — Telephone Encounter (Signed)
Willie [name]--  I checked with [facility] and they state they do not have order and did not receive specimen for the lactic acid order from 11/18/14.  Can you find out what happened?  Message status now shows read by pt on 11/18/14.

## 2014-11-22 ENCOUNTER — Telehealth: Payer: Self-pay | Admitting: Family

## 2014-11-22 DIAGNOSIS — E872 Acidosis, unspecified: Secondary | ICD-10-CM

## 2014-11-22 NOTE — Telephone Encounter (Signed)
Please ask lab to add on lactic acid to sample.  It appears that the test was not run.

## 2014-11-22 NOTE — Telephone Encounter (Signed)
Spoke with [place] lab, test would require a grey top tube and they did not receive that from [place]. Left message on pt's voicemail to return my call as we will need to have him return to the lab to complete along with potassium level.

## 2014-11-23 ENCOUNTER — Other Ambulatory Visit (INDEPENDENT_AMBULATORY_CARE_PROVIDER_SITE_OTHER): Payer: 59

## 2014-11-23 DIAGNOSIS — E876 Hypokalemia: Secondary | ICD-10-CM

## 2014-11-23 DIAGNOSIS — E872 Acidosis, unspecified: Secondary | ICD-10-CM

## 2014-11-23 DIAGNOSIS — E785 Hyperlipidemia, unspecified: Secondary | ICD-10-CM | POA: Diagnosis not present

## 2014-11-23 LAB — BASIC METABOLIC PANEL
BUN: 9 mg/dL (ref 6–23)
CO2: 31 mEq/L (ref 19–32)
CREATININE: 0.8 mg/dL (ref 0.40–1.50)
Calcium: 9 mg/dL (ref 8.4–10.5)
Chloride: 101 mEq/L (ref 96–112)
GFR: 139.06 mL/min (ref >60.00)
GLUCOSE: 191 mg/dL — AB (ref 70–99)
POTASSIUM: 3.6 meq/L (ref 3.5–5.1)
Sodium: 137 mEq/L (ref 135–145)

## 2014-11-23 LAB — LIPID PANEL
Cholesterol: 125 mg/dL (ref 0–200)
HDL: 52.2 mg/dL (ref >39.00)
LDL Cholesterol: 59 mg/dL (ref 0–99)
NonHDL: 72.8
Total CHOL/HDL Ratio: 2
Triglycerides: 68 mg/dL (ref 0.0–149.0)
VLDL: 13.6 mg/dL (ref 0.0–40.0)

## 2014-11-23 NOTE — Telephone Encounter (Signed)
Notified pt and scheduled lab visit for 11/23/14 at 1:30pm. Future lab orders entered.

## 2014-11-27 ENCOUNTER — Encounter: Payer: Self-pay | Admitting: Family

## 2014-11-28 ENCOUNTER — Telehealth: Payer: Self-pay | Admitting: Family

## 2014-11-28 ENCOUNTER — Encounter: Payer: Self-pay | Admitting: Family

## 2014-11-28 LAB — LACTIC ACID, PLASMA: LACTIC ACID: 1 mmol/L (ref 0.5–2.2)

## 2014-11-28 NOTE — Telephone Encounter (Signed)
Could you please check status of lactic acid?

## 2014-11-28 NOTE — Telephone Encounter (Signed)
Spoke with Mateo Flow at [facility] and was advised that result should be available by tomorrow afternoon.

## 2014-12-11 ENCOUNTER — Emergency Department (HOSPITAL_BASED_OUTPATIENT_CLINIC_OR_DEPARTMENT_OTHER)
Admission: EM | Admit: 2014-12-11 | Discharge: 2014-12-12 | Disposition: A | Discharge status: Home / Self Care | Payer: 59 | Attending: Emergency Medicine | Admitting: Emergency Medicine

## 2014-12-11 ENCOUNTER — Encounter (HOSPITAL_BASED_OUTPATIENT_CLINIC_OR_DEPARTMENT_OTHER): Payer: Self-pay | Admitting: Emergency Medicine

## 2014-12-11 DIAGNOSIS — E780 Pure hypercholesterolemia: Secondary | ICD-10-CM | POA: Diagnosis not present

## 2014-12-11 DIAGNOSIS — I10 Essential (primary) hypertension: Secondary | ICD-10-CM | POA: Insufficient documentation

## 2014-12-11 DIAGNOSIS — Z794 Long term (current) use of insulin: Secondary | ICD-10-CM | POA: Diagnosis not present

## 2014-12-11 DIAGNOSIS — Z8659 Personal history of other mental and behavioral disorders: Secondary | ICD-10-CM | POA: Diagnosis not present

## 2014-12-11 DIAGNOSIS — Z7982 Long term (current) use of aspirin: Secondary | ICD-10-CM | POA: Diagnosis not present

## 2014-12-11 DIAGNOSIS — K219 Gastro-esophageal reflux disease without esophagitis: Secondary | ICD-10-CM | POA: Insufficient documentation

## 2014-12-11 DIAGNOSIS — Z79899 Other long term (current) drug therapy: Secondary | ICD-10-CM | POA: Insufficient documentation

## 2014-12-11 DIAGNOSIS — K852 Alcohol induced acute pancreatitis without necrosis or infection: Secondary | ICD-10-CM

## 2014-12-11 DIAGNOSIS — E119 Type 2 diabetes mellitus without complications: Secondary | ICD-10-CM | POA: Insufficient documentation

## 2014-12-11 DIAGNOSIS — R1010 Upper abdominal pain, unspecified: Secondary | ICD-10-CM | POA: Diagnosis present

## 2014-12-11 LAB — COMPREHENSIVE METABOLIC PANEL
ALT: 29 U/L (ref 17–63)
AST: 32 U/L (ref 15–41)
Albumin: 4.1 g/dL (ref 3.5–5.0)
Alkaline Phosphatase: 83 U/L (ref 38–126)
Anion gap: 9 (ref 5–15)
BILIRUBIN TOTAL: 1 mg/dL (ref 0.3–1.2)
BUN: 10 mg/dL (ref 6–20)
CO2: 28 mmol/L (ref 22–32)
Calcium: 9.3 mg/dL (ref 8.9–10.3)
Chloride: 101 mmol/L (ref 101–111)
Creatinine, Ser: 0.83 mg/dL (ref 0.61–1.24)
GLUCOSE: 209 mg/dL — AB (ref 65–99)
Potassium: 3.4 mmol/L — ABNORMAL LOW (ref 3.5–5.1)
Sodium: 138 mmol/L (ref 135–145)
TOTAL PROTEIN: 7.3 g/dL (ref 6.5–8.1)

## 2014-12-11 LAB — I-STAT CG4 LACTIC ACID, ED: LACTIC ACID, VENOUS: 1.19 mmol/L (ref 0.5–2.0)

## 2014-12-11 LAB — CBC
HCT: 40.6 % (ref 39.0–52.0)
Hemoglobin: 14.5 g/dL (ref 13.0–17.0)
MCH: 31 pg (ref 26.0–34.0)
MCHC: 35.7 g/dL (ref 30.0–36.0)
MCV: 86.8 fL (ref 78.0–100.0)
Platelets: 268 10*3/uL (ref 150–400)
RBC: 4.68 MIL/uL (ref 4.22–5.81)
RDW: 12.1 % (ref 11.5–15.5)
WBC: 9.3 10*3/uL (ref 4.0–10.5)

## 2014-12-11 LAB — LIPASE, BLOOD: Lipase: 120 U/L — ABNORMAL HIGH (ref 22–51)

## 2014-12-11 MED ORDER — HYDROMORPHONE HCL 1 MG/ML IJ SOLN
1.0000 mg | Freq: Once | INTRAMUSCULAR | Status: AC
  Administered 2014-12-11: 1 mg via INTRAVENOUS
  Filled 2014-12-11: qty 1

## 2014-12-11 MED ORDER — PANTOPRAZOLE SODIUM 40 MG IV SOLR
40.0000 mg | Freq: Once | INTRAVENOUS | Status: AC
Start: 2014-12-11 — End: 2014-12-11
  Administered 2014-12-11: 40 mg via INTRAVENOUS
  Filled 2014-12-11: qty 40

## 2014-12-11 MED ORDER — ONDANSETRON HCL 4 MG/2ML IJ SOLN
4.0000 mg | Freq: Once | INTRAMUSCULAR | Status: AC
  Administered 2014-12-11: 4 mg via INTRAVENOUS
  Filled 2014-12-11: qty 2

## 2014-12-11 MED ORDER — SODIUM CHLORIDE 0.9 % IV BOLUS (SEPSIS)
1000.0000 mL | Freq: Once | INTRAVENOUS | Status: AC
  Administered 2014-12-11: 1000 mL via INTRAVENOUS

## 2014-12-11 NOTE — ED Provider Notes (Signed)
CSN: [id number]     Arrival date & time 12/11/14  2217 History  This chart was scribed for Shanon Rosser, MD by Rayna Sexton, ED scribe. This patient was seen in room MH10/MH10 and the patient's care was started at 11:01 PM.   Chief Complaint  Patient presents with  . Abdominal Pain   HPI  HPI Comments: Willie [name] is a 39 y.o. male, with a hx of pancreatitis, who presents to the Emergency Department complaining of moderate to severe upper abd pain with onset 3 days ago and worsening symptoms beginning today. Pt notes over consuming ETOH 4 days ago after 8 months of sobriety and then eating a large meal today which exacerbated his abd pain which he rates currently as a 10/10. He notes associated diarrhea multiple times today. He denies nausea and vomiting.  Past Medical History  Diagnosis Date  . Alcoholic pancreatitis     recurrent  . Diabetes mellitus, type II     New Onset 03/2010  . Hypertension   . 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 High cholesterol   . Sleep apnea   . GERD (gastroesophageal reflux disease)   . Depression    Past Surgical History  Procedure Laterality Date  . Lumbar microdiscectomy  ~ 2004    Dr Ellene Route   Family History  Problem Relation Age of Onset  . Diabetes Mother   . Lung cancer Brother     twin brother  . Colon cancer Neg Hx   . Pancreatic cancer Paternal Aunt    Social History  Substance Use Topics  . Smoking status: Never Smoker   . Smokeless tobacco: Never Used  . Alcohol Use: Yes     Comment: last drink on 11/15/14    Review of Systems  All other systems reviewed and are negative.  Allergies  Invokamet  Home Medications   Prior to Admission medications   Medication Sig Start Date End Date Taking? Authorizing Provider  aspirin EC 81 MG tablet Take 1 tablet (81 mg total) by mouth daily. 11/09/14   Debbrah Alar, NP  atorvastatin (LIPITOR) 20 MG tablet Take 1 tablet  (20 mg total) by mouth daily. 11/10/14   Debbrah Alar, NP  citalopram (CELEXA) 20 MG tablet 1/2 tab by mouth once daily for 1 week, then increase to a full tab once daily on week two. 11/14/14   Debbrah Alar, NP  glucose blood (TRUE METRIX BLOOD GLUCOSE TEST) test strip Use as instructed 11/10/14   Debbrah Alar, NP  Insulin Glargine (TOUJEO SOLOSTAR) 300 UNIT/ML SOPN Inject 10 Units into the skin daily. 11/11/14   Midge Minium, MD  Insulin Pen Needle 32G X 4 MM MISC Use to administer lantus injection daily. 11/10/14   Debbrah Alar, NP  Lancets [facility] ULTRASOFT) lancets Use as instructed TO TEST BLOOD GLUCOSE TWICE DAILY DX: E11.9 05/27/14   Debbrah Alar, NP  lisinopril (PRINIVIL,ZESTRIL) 20 MG tablet Take 1 tablet (20 mg total) by mouth daily. 11/09/14   Debbrah Alar, NP  pantoprazole (PROTONIX) 40 MG tablet TAKE 1 TABLET (40 MG TOTAL) BY MOUTH DAILY. 11/09/14   Debbrah Alar, NP  potassium chloride SA (K-DUR,KLOR-CON) 20 MEQ tablet Take 2 tablets (40 mEq total) by mouth once. 11/18/14   Debbrah Alar, NP  tadalafil (CIALIS) 10 MG tablet Take 1 tablet (10 mg total) by mouth daily as needed for erectile dysfunction. 11/09/14   Debbrah Alar,  NP  valACYclovir (VALTREX) 500 MG tablet Take 1 tablet (500 mg total) by mouth daily. 11/09/14   Debbrah Alar, NP   BP 155/101 mmHg  Pulse 88  Temp(Src) 98.2 F (36.8 C) (Oral)  Resp 20  Ht 6' (1.829 m)  Wt 217 lb (98.431 kg)  BMI 29.42 kg/m2  SpO2 95%   Physical Exam General: Well-developed, well-nourished male in no acute distress; appearance consistent with age of record HENT: normocephalic; atraumatic Eyes: pupils equal, round and reactive to light; extraocular muscles intact Neck: supple Heart: regular rate and rhythm; no murmurs, rubs or gallops Lungs: clear to auscultation bilaterally Abdomen: soft; nondistended; nontender; no masses or hepatosplenomegaly; bowel sounds present Extremities:  No deformity; full range of motion; pulses normal Neurologic: Awake, alert and oriented; motor function intact in all extremities and symmetric; no facial droop Skin: Warm and dry Psychiatric: Normal mood and affect  ED Course  Procedures  DIAGNOSTIC STUDIES: Oxygen Saturation is 100% on RA, normal by my interpretation.    COORDINATION OF CARE: 11:04 PM Discussed treatment plan with pt at bedside including pain medication and pt agreed to plan.   MDM   Nursing notes and vitals signs, including pulse oximetry, reviewed.  Summary of this visit's results, reviewed by myself:  Labs:  Results for orders placed or performed during the hospital encounter of 12/11/14 (from the past 24 hour(s))  Lipase, blood     Status: Abnormal   Collection Time: 12/11/14 10:45 PM  Result Value Ref Range   Lipase 120 (H) 22 - 51 U/L  Comprehensive metabolic panel     Status: Abnormal   Collection Time: 12/11/14 10:45 PM  Result Value Ref Range   Sodium 138 135 - 145 mmol/L   Potassium 3.4 (L) 3.5 - 5.1 mmol/L   Chloride 101 101 - 111 mmol/L   CO2 28 22 - 32 mmol/L   Glucose, Bld 209 (H) 65 - 99 mg/dL   BUN 10 6 - 20 mg/dL   Creatinine, Ser 0.83 0.61 - 1.24 mg/dL   Calcium 9.3 8.9 - 10.3 mg/dL   Total Protein 7.3 6.5 - 8.1 g/dL   Albumin 4.1 3.5 - 5.0 g/dL   AST 32 15 - 41 U/L   ALT 29 17 - 63 U/L   Alkaline Phosphatase 83 38 - 126 U/L   Total Bilirubin 1.0 0.3 - 1.2 mg/dL   GFR calc non Af Amer >60 >60 mL/min   GFR calc Af Amer >60 >60 mL/min   Anion gap 9 5 - 15  CBC     Status: None   Collection Time: 12/11/14 10:45 PM  Result Value Ref Range   WBC 9.3 4.0 - 10.5 K/uL   RBC 4.68 4.22 - 5.81 MIL/uL   Hemoglobin 14.5 13.0 - 17.0 g/dL   HCT 40.6 39.0 - 52.0 %   MCV 86.8 78.0 - 100.0 fL   MCH 31.0 26.0 - 34.0 pg   MCHC 35.7 30.0 - 36.0 g/dL   RDW 12.1 11.5 - 15.5 %   Platelets 268 150 - 400 K/uL  I-Stat CG4 Lactic Acid, ED     Status: None   Collection Time: 12/11/14 10:56 PM   Result Value Ref Range   Lactic Acid, Venous 1.19 0.5 - 2.0 mmol/L   1:47 AM Patient feeling better after IV fluids and medications.  I personally performed the services described in this documentation, which was scribed in my presence. The recorded information has been reviewed and is accurate.  Shanon Rosser, MD 12/12/14 [phone]

## 2014-12-11 NOTE — ED Notes (Signed)
Patient states that he is having a "flair up of pancreatitis". States that he is having generalized abdominal pain with N/V

## 2014-12-12 MED ORDER — OXYCODONE HCL 5 MG PO TABS: 5.0000 mg | ORAL_TABLET | Freq: Four times a day (QID) | ORAL | Status: DC | PRN

## 2014-12-12 MED ORDER — HYDROMORPHONE HCL 1 MG/ML IJ SOLN
1.0000 mg | Freq: Once | INTRAMUSCULAR | Status: AC
  Administered 2014-12-12: 1 mg via INTRAVENOUS
  Filled 2014-12-12: qty 1

## 2014-12-12 MED ORDER — PROMETHAZINE HCL 25 MG PO TABS: 25.0000 mg | ORAL_TABLET | Freq: Four times a day (QID) | ORAL | Status: DC | PRN

## 2014-12-12 NOTE — Discharge Instructions (Signed)

## 2014-12-13 ENCOUNTER — Encounter: Payer: Self-pay | Admitting: Family

## 2014-12-13 ENCOUNTER — Ambulatory Visit (INDEPENDENT_AMBULATORY_CARE_PROVIDER_SITE_OTHER): Payer: 59 | Admitting: Family

## 2014-12-13 VITALS — BP 148/120 | HR 91 | Temp 98.2°F | Resp 16 | Ht 72.0 in | Wt 218.2 lb

## 2014-12-13 DIAGNOSIS — IMO0002 Reserved for concepts with insufficient information to code with codable children: Secondary | ICD-10-CM

## 2014-12-13 DIAGNOSIS — K852 Alcohol induced acute pancreatitis without necrosis or infection: Secondary | ICD-10-CM

## 2014-12-13 DIAGNOSIS — F1010 Alcohol abuse, uncomplicated: Secondary | ICD-10-CM

## 2014-12-13 DIAGNOSIS — F418 Other specified anxiety disorders: Secondary | ICD-10-CM

## 2014-12-13 DIAGNOSIS — E1165 Type 2 diabetes mellitus with hyperglycemia: Secondary | ICD-10-CM | POA: Diagnosis not present

## 2014-12-13 DIAGNOSIS — K859 Acute pancreatitis, unspecified: Secondary | ICD-10-CM | POA: Diagnosis not present

## 2014-12-13 MED ORDER — PROMETHAZINE HCL 25 MG PO TABS: 25.0000 mg | ORAL_TABLET | Freq: Four times a day (QID) | ORAL | Status: DC | PRN

## 2014-12-13 MED ORDER — OXYCODONE HCL 5 MG PO TABS: 5.0000 mg | ORAL_TABLET | Freq: Four times a day (QID) | ORAL | Status: DC | PRN

## 2014-12-13 MED ORDER — CITALOPRAM HYDROBROMIDE 20 MG PO TABS: ORAL_TABLET | ORAL | Status: DC

## 2014-12-13 NOTE — Progress Notes (Signed)
Pre visit review using our clinic review tool, if applicable. No additional management support is needed unless otherwise documented below in the visit note. 

## 2014-12-13 NOTE — Progress Notes (Signed)
Subjective:    Patient ID: Willie [name], male    DOB: 02/06/1976, 39 y.o.   MRN: [id number]  HPI  [name] is a 39 yr old male who presents today for ED follow up.  1) Pancreatitis- he was evaluated in the ED with acute abdominal pain on 8/14. Record is reviewed. He apparently had an alcohol binge 4 days prior to the start of the abdominal pain.  He was given iv fluid in the ED as well as an rx for pain medication (oxycodone 5mg  #20).  He is requesting a refill today due to ongoing pain. Reports that he is tolerating PO's.  Reports that the pain med only lasts 3 hrs.    2) Depression/anxiety-was initially seen on 7/22 with worsening anxiety and was started on citalopram. Reports no recent panic attacks.  Mood is good.    3) DM2- reports sugars have been 120-140 throughout the day. He is taking insulin in the AM.   Lab Results  Component Value Date   HGBA1C 10.0* 11/09/2014   HGBA1C 15.0* 05/24/2014   HGBA1C 7.6* 08/04/2012   Lab Results  Component Value Date   MICROALBUR 1.0 05/24/2014   LDLCALC 59 11/23/2014   CREATININE 0.83 12/11/2014     Review of Systems See HPI  Past Medical History  Diagnosis Date  . Alcoholic pancreatitis     recurrent  . Diabetes mellitus, type II     New Onset 03/2010  . Hypertension   . 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 High cholesterol   . Sleep apnea   . GERD (gastroesophageal reflux disease)   . Depression     Social History   Social History  . Marital Status: Legally Separated    Spouse Name: N/A  . Number of Children: 2  . 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 Smoking status: Never Smoker   . Smokeless tobacco: Never Used  . Alcohol Use: Yes     Comment: last drink on 11/15/14  . Drug Use: No  . Sexual Activity: Not on file   Other Topics Concern  . 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 Lumbar microdiscectomy  ~ 2004    Dr Ellene Route    Family History  Problem Relation Age of Onset  . Diabetes Mother   . Lung cancer Brother     twin brother  . Colon cancer Neg Hx   . Pancreatic cancer Paternal Aunt     Allergies  Allergen Reactions  . Invokamet [Canagliflozin-Metformin Hcl] Other (See Comments)    Lactic acidosis    Current Outpatient Prescriptions on File Prior to Visit  Medication Sig Dispense Refill  . aspirin EC 81 MG tablet Take 1 tablet (81 mg total) by mouth daily.    Marland Kitchen atorvastatin (LIPITOR) 20 MG tablet Take 1 tablet (20 mg total) by mouth daily. 30 tablet 2  . citalopram (CELEXA) 20 MG tablet 1/2 tab by mouth once daily for  1 week, then increase to a full tab once daily on week two. 30 tablet 0  . glucose blood (TRUE METRIX BLOOD GLUCOSE TEST) test strip Use as instructed 180 each 1  . Insulin Glargine (TOUJEO SOLOSTAR) 300 UNIT/ML SOPN Inject 10 Units into the skin daily. 5 pen 0  . Insulin Pen Needle 32G X 4 MM MISC Use to administer lantus injection daily. 100 each 1  . Lancets (ONETOUCH ULTRASOFT) lancets Use as instructed TO TEST BLOOD GLUCOSE TWICE DAILY DX: E11.9 100 each 5  . lisinopril (PRINIVIL,ZESTRIL) 20 MG tablet Take 1 tablet (20 mg total) by mouth daily. 30 tablet 5  . oxyCODONE (OXY IR/ROXICODONE) 5 MG immediate release tablet Take 1-2 tablets (5-10 mg total) by mouth every 6 (six) hours as needed (for pain). 20 tablet 0  . pantoprazole (PROTONIX) 40 MG tablet TAKE 1 TABLET (40 MG TOTAL) BY MOUTH DAILY. 30 tablet 5  . potassium chloride SA (K-DUR,KLOR-CON) 20 MEQ tablet Take 2 tablets (40 mEq total) by mouth once. 2 tablet 0  . promethazine (PHENERGAN) 25 MG tablet Take 1  tablet (25 mg total) by mouth every 6 (six) hours as needed for nausea or vomiting. 10 tablet 0  . tadalafil (CIALIS) 10 MG tablet Take 1 tablet (10 mg total) by mouth daily as needed for erectile dysfunction. 10 tablet 5  . valACYclovir (VALTREX) 500 MG tablet Take 1 tablet (500 mg total) by mouth daily. 30 tablet 5   No current facility-administered medications on file prior to visit.    BP 148/120 mmHg  Pulse 91  Temp(Src) 98.2 F (36.8 C) (Oral)  Resp 16  Ht 6' (1.829 m)  Wt 218 lb 3.2 oz (98.975 kg)  BMI 29.59 kg/m2  SpO2 97%       Objective:   Physical Exam  Constitutional: He is oriented to person, place, and time. He appears well-developed and well-nourished. No distress.  HENT:  Head: Normocephalic and atraumatic.  Cardiovascular: Normal rate and regular rhythm.   No murmur heard. Pulmonary/Chest: Effort normal and breath sounds normal. No respiratory distress. He has no wheezes. He has no rales.  Abdominal: Soft. He exhibits no distension and no mass. There is tenderness in the epigastric area. There is no rebound and no guarding.  Musculoskeletal: He exhibits no edema.  Neurological: He is alert and oriented to person, place, and time.  Skin: Skin is warm and dry.  Psychiatric: He has a normal mood and affect. His behavior is normal. Thought content normal.          Assessment & Plan:

## 2014-12-13 NOTE — Patient Instructions (Addendum)
Increase toujeo to 12 units once daily once you are back on a regular diet. Continue clear liquids and advance diet slowly as tolerated over the next few days.  Call if you develop worsening abdominal pain or if you are unable to keep down food/liquid or if your abdominal pain does not improve over the next 1 week.  You will be contacted about your referral to endocrinology (diabetes specialist).

## 2014-12-14 ENCOUNTER — Encounter: Payer: Self-pay | Admitting: Family

## 2014-12-14 ENCOUNTER — Other Ambulatory Visit: Payer: Self-pay | Admitting: Family

## 2014-12-14 DIAGNOSIS — E876 Hypokalemia: Secondary | ICD-10-CM

## 2014-12-14 DIAGNOSIS — K859 Acute pancreatitis without necrosis or infection, unspecified: Secondary | ICD-10-CM | POA: Insufficient documentation

## 2014-12-14 LAB — COMPREHENSIVE METABOLIC PANEL
ALK PHOS: 90 U/L (ref 39–117)
ALT: 25 U/L (ref 0–53)
AST: 29 U/L (ref 0–37)
Albumin: 4.4 g/dL (ref 3.5–5.2)
BUN: 6 mg/dL (ref 6–23)
CO2: 29 mEq/L (ref 19–32)
Calcium: 9.7 mg/dL (ref 8.4–10.5)
Chloride: 99 mEq/L (ref 96–112)
Creatinine, Ser: 0.76 mg/dL (ref 0.40–1.50)
GFR: 147.49 mL/min (ref >60.00)
GLUCOSE: 167 mg/dL — AB (ref 70–99)
POTASSIUM: 3.4 meq/L — AB (ref 3.5–5.1)
Sodium: 137 mEq/L (ref 135–145)
TOTAL PROTEIN: 7.5 g/dL (ref 6.0–8.3)
Total Bilirubin: 0.9 mg/dL (ref 0.2–1.2)

## 2014-12-14 LAB — LIPASE: Lipase: 34 U/L (ref 11.0–59.0)

## 2014-12-14 MED ORDER — POTASSIUM CHLORIDE CRYS ER 20 MEQ PO TBCR: 20.0000 meq | EXTENDED_RELEASE_TABLET | Freq: Every day | ORAL | Status: DC

## 2014-12-14 NOTE — Assessment & Plan Note (Signed)
Counseled on importance of absinence.

## 2014-12-14 NOTE — Assessment & Plan Note (Signed)
Lipase/cmet sent.  Clinically stable. Refill provided for oxycodone.   Advised pt:  Continue clear liquids and advance diet slowly as tolerated over the next few days.  Call if you develop worsening abdominal pain or if you are unable to keep down food/liquid or if your abdominal pain does not improve over the next 1 week.

## 2014-12-14 NOTE — Assessment & Plan Note (Signed)
Suboptimally controlled. Once he is tolerating regular diet again, I advised him to increase toujeo from 10-12 units.  Will refer to endocrinology as well for ongoing management.

## 2014-12-14 NOTE — Assessment & Plan Note (Signed)
Mood is improved on citalopram. Continue same.

## 2014-12-15 NOTE — Telephone Encounter (Signed)
Please contact pt re: unread mychart message. 

## 2014-12-16 NOTE — Telephone Encounter (Signed)
Notified pt. States he never started Potassium. He may not be able to pick up Rx until Monday and states he will call us back next week to schedule 1 week lab appt.  Future lab orders already in place.

## 2014-12-26 ENCOUNTER — Ambulatory Visit (INDEPENDENT_AMBULATORY_CARE_PROVIDER_SITE_OTHER): Payer: 59 | Admitting: Family

## 2014-12-26 ENCOUNTER — Encounter: Payer: Self-pay | Admitting: Family

## 2014-12-26 ENCOUNTER — Ambulatory Visit: Payer: Self-pay | Admitting: Family

## 2014-12-26 VITALS — BP 140/86 | HR 112 | Temp 98.0°F | Resp 16 | Wt 216.4 lb

## 2014-12-26 DIAGNOSIS — K852 Alcohol induced acute pancreatitis without necrosis or infection: Secondary | ICD-10-CM

## 2014-12-26 DIAGNOSIS — W19XXXA Unspecified fall, initial encounter: Secondary | ICD-10-CM

## 2014-12-26 MED ORDER — OXYCODONE HCL 5 MG PO TABS: 5.0000 mg | ORAL_TABLET | Freq: Four times a day (QID) | ORAL | Status: DC | PRN

## 2014-12-26 NOTE — Patient Instructions (Signed)
You may use tylenol as needed for pain. We will request your ER records for review.   Stitches should be removed on Thursday or Friday of this week.

## 2014-12-26 NOTE — Progress Notes (Signed)
Pre visit review using our clinic review tool, if applicable. No additional management support is needed unless otherwise documented below in the visit note. 

## 2014-12-26 NOTE — Progress Notes (Signed)
Subjective:    Patient ID: Willie [name], male    DOB: Sep 14, 1975, 39 y.o.   MRN: [id number]  HPI   Mr. Wiedeman is a 39 yr old male who presents today for ED follow up. Fell down the stairs 2 nights ago. Slipped on a wet towel at the top of the stairs and fell all the way down.  He had LOC, pt unsure how long.  Reports that wife contacted EMS.  He was brought to the ED. Reports feeling sore.  + bruising right elbow.  Right brow.  No visual changes.    Pancreatitis- symptoms resolved.  Reports that he is avoiding alcohol.   Review of Systems See HPI  Past Medical History  Diagnosis Date  . Alcoholic pancreatitis     recurrent  . Diabetes mellitus, type II     New Onset 03/2010  . Hypertension   . 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 High cholesterol   . Sleep apnea   . GERD (gastroesophageal reflux disease)   . Depression     Social History   Social History  . Marital Status: Legally Separated    Spouse Name: N/A  . Number of Children: 2  . 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 Smoking status: Never Smoker   . Smokeless tobacco: Never Used  . Alcohol Use: Yes     Comment: last drink on 11/15/14  . Drug Use: No  . Sexual Activity: Not on file   Other Topics Concern  . 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 Lumbar microdiscectomy  ~ 2004    Dr Ellene Route    Family History  Problem Relation Age of Onset  . Diabetes Mother   . Lung cancer Brother     twin brother  . Colon cancer Neg Hx   . Pancreatic cancer Paternal Aunt     Allergies  Allergen Reactions  . Invokamet  [Canagliflozin-Metformin Hcl] Other (See Comments)    Lactic acidosis    Current Outpatient Prescriptions on File Prior to Visit  Medication Sig Dispense Refill  . aspirin EC 81 MG tablet Take 1 tablet (81 mg total) by mouth daily.    Marland Kitchen atorvastatin (LIPITOR) 20 MG tablet Take 1 tablet (20 mg total) by mouth daily. 30 tablet 2  . citalopram (CELEXA) 20 MG tablet Take 1 tablet once daily. 30 tablet 2  . glucose blood (TRUE METRIX BLOOD GLUCOSE TEST) test strip Use as instructed 180 each 1  . Insulin Glargine (TOUJEO SOLOSTAR) 300 UNIT/ML SOPN Inject 10 Units into the skin daily. 5 pen 0  . Insulin Pen Needle 32G X 4 MM MISC Use to administer lantus injection daily. 100 each 1  . Lancets (ONETOUCH ULTRASOFT) lancets Use as instructed TO TEST BLOOD GLUCOSE TWICE DAILY DX: E11.9 100 each 5  . lisinopril (PRINIVIL,ZESTRIL) 20 MG tablet Take 1 tablet (20 mg total) by mouth daily. 30 tablet 5  . pantoprazole (PROTONIX) 40 MG  tablet TAKE 1 TABLET (40 MG TOTAL) BY MOUTH DAILY. 30 tablet 5  . potassium chloride SA (K-DUR,KLOR-CON) 20 MEQ tablet Take 1 tablet (20 mEq total) by mouth daily. 30 tablet 3  . tadalafil (CIALIS) 10 MG tablet Take 1 tablet (10 mg total) by mouth daily as needed for erectile dysfunction. 10 tablet 5  . valACYclovir (VALTREX) 500 MG tablet Take 1 tablet (500 mg total) by mouth daily. 30 tablet 5  . oxyCODONE (OXY IR/ROXICODONE) 5 MG immediate release tablet Take 1 tablet (5 mg total) by mouth every 6 (six) hours as needed (for pain). (Patient not taking: Reported on 12/26/2014) 30 tablet 0  . promethazine (PHENERGAN) 25 MG tablet Take 1 tablet (25 mg total) by mouth every 6 (six) hours as needed for nausea or vomiting. (Patient not taking: Reported on 12/26/2014) 20 tablet 0   No current facility-administered medications on file prior to visit.    BP 140/86 mmHg  Pulse 112  Temp(Src) 98 F (36.7 C) (Oral)  Resp 16  Wt 216 lb 6 oz (98.147 kg)  SpO2 98%       Objective:     Physical Exam  Constitutional: He appears well-developed and well-nourished. No distress.  HENT:  Head: Normocephalic and atraumatic.  Eyes: Right conjunctiva has a hemorrhage.    Cardiovascular: Normal rate and regular rhythm.   No murmur heard. Pulmonary/Chest: Effort normal and breath sounds normal. No respiratory distress. He has no wheezes. He has no rales.  Abdominal: There is no tenderness. There is no rebound.  Skin:  + ecchymosis and soft tissue swelling right inner elbow.           Assessment & Plan:  Patient with accidental fall,  Multiple contusions.  Wife is an Therapist, sports and he wishes to have her remove his sutures. Requests rx for pain.  Rx provided for oxycodone for short term.  He is unsure of what hospital he was evaluated at.  He will ask his wife and send me this info via mychart so we can review his records.  He denies any x rays today.    Pancreatitis- clinically resolved.

## 2014-12-28 ENCOUNTER — Emergency Department (HOSPITAL_COMMUNITY)
Admission: EM | Admit: 2014-12-28 | Discharge: 2014-12-29 | Disposition: A | Discharge status: Home / Self Care | Payer: 59 | Attending: Emergency Medicine | Admitting: Emergency Medicine

## 2014-12-28 ENCOUNTER — Encounter (HOSPITAL_COMMUNITY): Payer: Self-pay | Admitting: *Deleted

## 2014-12-28 DIAGNOSIS — R45851 Suicidal ideations: Secondary | ICD-10-CM

## 2014-12-28 DIAGNOSIS — I10 Essential (primary) hypertension: Secondary | ICD-10-CM | POA: Diagnosis not present

## 2014-12-28 DIAGNOSIS — E119 Type 2 diabetes mellitus without complications: Secondary | ICD-10-CM | POA: Insufficient documentation

## 2014-12-28 DIAGNOSIS — K219 Gastro-esophageal reflux disease without esophagitis: Secondary | ICD-10-CM | POA: Insufficient documentation

## 2014-12-28 DIAGNOSIS — F329 Major depressive disorder, single episode, unspecified: Secondary | ICD-10-CM | POA: Diagnosis not present

## 2014-12-28 DIAGNOSIS — Z8669 Personal history of other diseases of the nervous system and sense organs: Secondary | ICD-10-CM | POA: Insufficient documentation

## 2014-12-28 DIAGNOSIS — Z794 Long term (current) use of insulin: Secondary | ICD-10-CM | POA: Insufficient documentation

## 2014-12-28 DIAGNOSIS — F1310 Sedative, hypnotic or anxiolytic abuse, uncomplicated: Secondary | ICD-10-CM | POA: Insufficient documentation

## 2014-12-28 DIAGNOSIS — Z79899 Other long term (current) drug therapy: Secondary | ICD-10-CM | POA: Diagnosis not present

## 2014-12-28 DIAGNOSIS — F1020 Alcohol dependence, uncomplicated: Secondary | ICD-10-CM | POA: Diagnosis not present

## 2014-12-28 DIAGNOSIS — Z7982 Long term (current) use of aspirin: Secondary | ICD-10-CM | POA: Diagnosis not present

## 2014-12-28 DIAGNOSIS — E780 Pure hypercholesterolemia: Secondary | ICD-10-CM | POA: Diagnosis not present

## 2014-12-28 DIAGNOSIS — Y908 Blood alcohol level of 240 mg/100 ml or more: Secondary | ICD-10-CM | POA: Diagnosis not present

## 2014-12-28 DIAGNOSIS — F1099 Alcohol use, unspecified with unspecified alcohol-induced disorder: Secondary | ICD-10-CM | POA: Diagnosis not present

## 2014-12-28 DIAGNOSIS — F410 Panic disorder [episodic paroxysmal anxiety] without agoraphobia: Secondary | ICD-10-CM | POA: Diagnosis not present

## 2014-12-28 LAB — COMPREHENSIVE METABOLIC PANEL
ALK PHOS: 130 U/L — AB (ref 38–126)
ALT: 37 U/L (ref 17–63)
ANION GAP: 11 (ref 5–15)
AST: 28 U/L (ref 15–41)
Albumin: 4.6 g/dL (ref 3.5–5.0)
BILIRUBIN TOTAL: 1 mg/dL (ref 0.3–1.2)
BUN: 11 mg/dL (ref 6–20)
CALCIUM: 9.2 mg/dL (ref 8.9–10.3)
CO2: 28 mmol/L (ref 22–32)
CREATININE: 0.8 mg/dL (ref 0.61–1.24)
Chloride: 103 mmol/L (ref 101–111)
Glucose, Bld: 210 mg/dL — ABNORMAL HIGH (ref 65–99)
Potassium: 3.8 mmol/L (ref 3.5–5.1)
Sodium: 142 mmol/L (ref 135–145)
TOTAL PROTEIN: 8.5 g/dL — AB (ref 6.5–8.1)

## 2014-12-28 LAB — CBC
HCT: 43 % (ref 39.0–52.0)
HEMOGLOBIN: 15.1 g/dL (ref 13.0–17.0)
MCH: 31.1 pg (ref 26.0–34.0)
MCHC: 35.1 g/dL (ref 30.0–36.0)
MCV: 88.7 fL (ref 78.0–100.0)
Platelets: 536 10*3/uL — ABNORMAL HIGH (ref 150–400)
RBC: 4.85 MIL/uL (ref 4.22–5.81)
RDW: 12.2 % (ref 11.5–15.5)
WBC: 11.5 10*3/uL — ABNORMAL HIGH (ref 4.0–10.5)

## 2014-12-28 LAB — RAPID URINE DRUG SCREEN, HOSP PERFORMED
Amphetamines: NOT DETECTED
BARBITURATES: NOT DETECTED
Benzodiazepines: POSITIVE — AB
COCAINE: NOT DETECTED
OPIATES: NOT DETECTED
TETRAHYDROCANNABINOL: NOT DETECTED

## 2014-12-28 LAB — ACETAMINOPHEN LEVEL

## 2014-12-28 LAB — SALICYLATE LEVEL

## 2014-12-28 LAB — ETHANOL: Alcohol, Ethyl (B): 262 mg/dL — ABNORMAL HIGH (ref <5)

## 2014-12-28 LAB — CBG MONITORING, ED: Glucose-Capillary: 188 mg/dL — ABNORMAL HIGH (ref 65–99)

## 2014-12-28 MED ORDER — PANTOPRAZOLE SODIUM 40 MG PO TBEC: 40.0000 mg | DELAYED_RELEASE_TABLET | Freq: Every day | ORAL | Status: DC

## 2014-12-28 MED ORDER — ACETAMINOPHEN 325 MG PO TABS
650.0000 mg | ORAL_TABLET | Freq: Once | ORAL | Status: AC
  Administered 2014-12-29: 650 mg via ORAL
  Filled 2014-12-28: qty 2

## 2014-12-28 MED ORDER — ATORVASTATIN CALCIUM 20 MG PO TABS
20.0000 mg | ORAL_TABLET | Freq: Every day | ORAL | Status: DC
Start: 2014-12-29 — End: 2014-12-29
  Filled 2014-12-28: qty 1

## 2014-12-28 MED ORDER — POTASSIUM CHLORIDE CRYS ER 20 MEQ PO TBCR: 20.0000 meq | EXTENDED_RELEASE_TABLET | Freq: Every day | ORAL | Status: DC

## 2014-12-28 MED ORDER — LISINOPRIL 20 MG PO TABS
20.0000 mg | ORAL_TABLET | Freq: Every day | ORAL | Status: DC
  Filled 2014-12-28: qty 1

## 2014-12-28 MED ORDER — NICOTINE 21 MG/24HR TD PT24: 21.0000 mg | MEDICATED_PATCH | Freq: Every day | TRANSDERMAL | Status: DC

## 2014-12-28 MED ORDER — INSULIN GLARGINE 100 UNIT/ML ~~LOC~~ SOLN
10.0000 [IU] | Freq: Every day | SUBCUTANEOUS | Status: DC
  Filled 2014-12-28: qty 0.1

## 2014-12-28 MED ORDER — ONDANSETRON HCL 4 MG PO TABS: 4.0000 mg | ORAL_TABLET | Freq: Three times a day (TID) | ORAL | Status: DC | PRN
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
## 2014-12-28 NOTE — ED Notes (Signed)
PT INFO ME HE HAVE TO USE RESTROOM. INFO PT OF URINE SAMPLE, AFTER BEING IN RESTROOM FOR ABOUT ONE MIN PT INFO [employer] HE DID NOT HAVE TO USE IT
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
## 2014-12-29 DIAGNOSIS — Y908 Blood alcohol level of 240 mg/100 ml or more: Secondary | ICD-10-CM

## 2014-12-29 DIAGNOSIS — F410 Panic disorder [episodic paroxysmal anxiety] without agoraphobia: Secondary | ICD-10-CM

## 2014-12-29 DIAGNOSIS — F1020 Alcohol dependence, uncomplicated: Secondary | ICD-10-CM | POA: Diagnosis not present

## 2014-12-29 LAB — CBG MONITORING, ED
GLUCOSE-CAPILLARY: 166 mg/dL — AB (ref 65–99)
Glucose-Capillary: 217 mg/dL — ABNORMAL HIGH (ref 65–99)

## 2014-12-29 MED ORDER — ACETAMINOPHEN 325 MG PO TABS
650.0000 mg | ORAL_TABLET | Freq: Once | ORAL | Status: AC
  Administered 2014-12-29: 650 mg via ORAL
  Filled 2014-12-29: qty 2

## 2014-12-29 MED ORDER — LISINOPRIL 20 MG PO TABS
20.0000 mg | ORAL_TABLET | Freq: Every day | ORAL | Status: DC
  Administered 2014-12-29: 20 mg via ORAL
  Filled 2014-12-29: qty 1

## 2014-12-29 NOTE — ED Provider Notes (Signed)
CSN: [id number]     Arrival date & time 12/28/14  2030 History   First MD Initiated Contact with Patient 12/28/14 2223     Chief Complaint  Patient presents with  . Suicidal     (Consider location/radiation/quality/duration/timing/severity/associated sxs/prior Treatment) HPI Comments: Patient with a history of alcohol dependence, DM, HTN, pancreatitis presents under IVC for polysubstance abuse and being a danger to himself. Per wife's statement, he has been verbalizing suicidal threats throughout today and taking too many Xanax and Percocet. He has recently been arrested with assault while incarcerated, as well as fallen down his stairs at home ( 2 days ago) causing multiple areas of bruising. He denies being suicidal or taking too much medication. He denies drinking today. He complains of pain in the right arm and bilateral hips from injuries sustained in falling 2 days ago.   The history is provided by the patient. No language interpreter was used.    Past Medical History  Diagnosis Date  . Alcoholic pancreatitis     recurrent  . Diabetes mellitus, type II     New Onset 03/2010  . Hypertension   . 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 High cholesterol   . Sleep apnea   . GERD (gastroesophageal reflux disease)   . Depression    Past Surgical History  Procedure Laterality Date  . Lumbar microdiscectomy  ~ 2004    Dr Ellene Route   Family History  Problem Relation Age of Onset  . Diabetes Mother   . Lung cancer Brother     twin brother  . Colon cancer Neg Hx   . Pancreatic cancer Paternal Aunt    Social History  Substance Use Topics  . Smoking status: Never Smoker   . Smokeless tobacco: Never Used  . Alcohol Use: Yes     Comment: last drink on 11/15/14    Review of Systems  Constitutional: Negative for fever and chills.  HENT: Negative.   Respiratory: Negative.   Cardiovascular: Negative.   Gastrointestinal: Negative.    Musculoskeletal:       See HPI.  Skin: Positive for color change.  Neurological: Negative.   Psychiatric/Behavioral:       See HPI.      Allergies  Invokamet and Metformin and related  Home Medications   Prior to Admission medications   Medication Sig Start Date End Date Taking? Authorizing Provider  acetaminophen (TYLENOL) 325 MG tablet Take 650 mg by mouth every 6 (six) hours as needed for moderate pain.   Yes Historical Provider, MD  aspirin EC 81 MG tablet Take 1 tablet (81 mg total) by mouth daily. 11/09/14  Yes Debbrah Alar, NP  atorvastatin (LIPITOR) 20 MG tablet Take 1 tablet (20 mg total) by mouth daily. 11/10/14  Yes Debbrah Alar, NP  citalopram (CELEXA) 20 MG tablet Take 1 tablet once daily. 12/13/14  Yes Debbrah Alar, NP  ibuprofen (ADVIL,MOTRIN) 200 MG tablet Take 600 mg by mouth every 8 (eight) hours as needed for moderate pain.   Yes Historical Provider, MD  Insulin Glargine (TOUJEO SOLOSTAR) 300 UNIT/ML SOPN Inject 10 Units into the skin daily. Patient taking differently: Inject 12 Units into the skin daily.  11/11/14  Yes Midge Minium, MD  lisinopril (PRINIVIL,ZESTRIL) 20 MG tablet Take 1 tablet (20 mg total) by mouth daily. 11/09/14  Yes Debbrah Alar, NP  Multiple Vitamin (MULTIVITAMIN WITH MINERALS) TABS tablet Take 2 tablets  by mouth daily.   Yes Historical Provider, MD  oxyCODONE (OXY IR/ROXICODONE) 5 MG immediate release tablet Take 1 tablet (5 mg total) by mouth every 6 (six) hours as needed (for pain). 12/26/14  Yes Debbrah Alar, NP  pantoprazole (PROTONIX) 40 MG tablet TAKE 1 TABLET (40 MG TOTAL) BY MOUTH DAILY. 11/09/14  Yes Debbrah Alar, NP  potassium chloride SA (K-DUR,KLOR-CON) 20 MEQ tablet Take 1 tablet (20 mEq total) by mouth daily. 12/14/14  Yes Debbrah Alar, NP  tadalafil (CIALIS) 10 MG tablet Take 1 tablet (10 mg total) by mouth daily as needed for erectile dysfunction. 11/09/14  Yes Debbrah Alar, NP   valACYclovir (VALTREX) 500 MG tablet Take 1 tablet (500 mg total) by mouth daily. 11/09/14  Yes Debbrah Alar, NP  promethazine (PHENERGAN) 25 MG tablet Take 1 tablet (25 mg total) by mouth every 6 (six) hours as needed for nausea or vomiting. Patient not taking: Reported on 12/26/2014 12/13/14   Debbrah Alar, NP   BP 170/119 mmHg  Pulse 115  Temp(Src) 98.4 F (36.9 C) (Oral)  Resp 20  SpO2 100% Physical Exam  Constitutional: He is oriented to person, place, and time. He appears well-developed and well-nourished.  HENT:  Head: Normocephalic.  Neck: Normal range of motion. Neck supple.  Cardiovascular: Normal rate and regular rhythm.   Pulmonary/Chest: Effort normal and breath sounds normal.  Abdominal: Soft. Bowel sounds are normal. There is no tenderness. There is no rebound and no guarding.  Musculoskeletal: Normal range of motion.  Right upper extremity has extensive aged bruising to proximal forearm and distal upper arm over elbow. No bony deformity. No tenseness of forearm musculature to suggest compartment syndrome. Bruising with same appearance of age to bilateral hips, right greater than left. . No bony deformity. Ambulatory without difficulty.  Neurological: He is alert and oriented to person, place, and time.  Oriented. Speech clear and focused. CN's 3-12 grossly intact. Coordinated without deficit. Ambulatory without imbalance.   Skin: Skin is warm and dry. No rash noted.  Psychiatric: He has a normal mood and affect.    ED Course  Procedures (including critical care time) Labs Review Labs Reviewed  COMPREHENSIVE METABOLIC PANEL - Abnormal; Notable for the following:    Glucose, Bld 210 (*)    Total Protein 8.5 (*)    Alkaline Phosphatase 130 (*)    All other components within normal limits  ETHANOL - Abnormal; Notable for the following:    Alcohol, Ethyl (B) 262 (*)    All other components within normal limits  ACETAMINOPHEN LEVEL - Abnormal; Notable for  the following:    Acetaminophen (Tylenol), Serum <10 (*)    All other components within normal limits  CBC - Abnormal; Notable for the following:    WBC 11.5 (*)    Platelets 536 (*)    All other components within normal limits  URINE RAPID DRUG SCREEN, HOSP PERFORMED - Abnormal; Notable for the following:    Benzodiazepines POSITIVE (*)    All other components within normal limits  CBG MONITORING, ED - Abnormal; Notable for the following:    Glucose-Capillary 188 (*)    All other components within normal limits  CBG MONITORING, ED - Abnormal; Notable for the following:    Glucose-Capillary 217 (*)    All other components within normal limits  SALICYLATE LEVEL    Imaging Review No results found. I have personally reviewed and evaluated these images and lab results as part of my medical decision-making.  EKG Interpretation None      MDM   Final diagnoses:  None    1. IVC - suicidal ideation, danger to self  The patient attempted to walk out of emergency department through ambulance doors. Redirected to his room and transferred to locked unit immediately.  The patient will require TTS consultation and psychiatric evaluation to determine disposition.   Hyperglycemia mild. He reports he did not take his insulin yesterday. Regular dosing ordered in emergency department, due later this morning. Repeat CBG without significant change. No acidosis. Will continue with regular dosing insulin      Charlann Lange, PA-C 12/29/14 1324  Julianne Rice, MD 01/01/15 2302

## 2014-12-29 NOTE — ED Notes (Signed)
PA Shari at bedside to eval pt.

## 2014-12-29 NOTE — Discharge Instructions (Signed)
Outpatient Programs   [facility] Out[facility] [place], [place] [address]  [phone] Private insurance, Medicare A&B, and [facility]   [facility] ([facility] Drug Services)  [address] # [place],  [place], [place] [address] [phone] Medicaid, Self Pay   [facility] [address] # [place]  [place], [place] Medicaid and Private Insurance, Self Pay   [facility]  IOP and Partial Hospitalization Program  [place].  [place], [place] [address]  [phone] Private Insurance, [place] only for partial   [facility]  [place].  [place], [place] Private Insurance and Self Pay    [facility] [address]  [place], [place], and Self Pay.   Medical laboratory scientific officer (Partial Hospitalization) [place], [place] [address] [phone] [facility] insurance, some Medicaid)     [place], [place] [address]  Private Insurance, [place], and Self Pay   [facility]:  [facility] [address] [place], [place], [place] [address] [phone] [place], [place] [address] Phone: [phone] or [phone] [facility] [address] [place], [place] [address] [phone] Medicaid, private insurance  [facility]      [address]    [place], [place] [address]  [phone] or [place]. [address] [phone]  Self Pay only, sliding scale  [facility]  [address]  [place], [place] [address] [phone] ([facility]) Self Pay Only   [place].  [place], [place] Private Insurance, and Self  pay Residential Treatment Programs   [facility] ([place]  [address]  [place], [place] [address]  [phone] or [phone] Detox (Self Pay Only)  Residential Rehab 14 days (Medicare, Medicaid, private insurance, and self pay)   [facility] (Residential Treatment Services)  [place], [place]  Detox (Self Pay and Medicaid limited availability)  Rehab only Male (Medicare, Medicaid, private insurance, and self pay)   [facility] [address]      [address]  [place], [place] [address]  [phone] or [place]  [place].  [place], [place] [address]  [phone]  Treatment Only, must make assessment appointment, and must be sober for assessment appointment.  Self Pay Only, Medicare A&B, [facility], [place] ID only! *Transportation assistance offered from [place] on [place], [place] [address] Walk in interviews M-Sat 8-4p No pending legal charges [phone]  [facility] ([place]) 90 days [place], [place] or [phone] (Must be homeless, No SI/AVH)   [facility]:  [facility]  Outpatient Surgery Referral or walk in over weekend [place], [place] -- [phone] Admissions -- [phone] Private Insurance Males/Females, call to make referrals, multiple facilities  [facility] [address] [place], [place] [address] Local [phone] [place] [phone] Commercial Insurance Only  [employer] [place].  [place], [place] [address]  [phone] (Males, upfront fee)  [facility]  [place], [place] Private Insurance, Self Pay   [facility]  [place], [place] (Males and females, must be willing to work and go to [employer])  [facility]    [address]  [place], [place], No insurance  [place] program: [facility] [address] [place], [place] [address] [phone])
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
## 2014-12-29 NOTE — ED Notes (Signed)
PA Shari notified of elevated B/P, pending now dose of Lisinopril, to hold 10 am dose.

## 2014-12-29 NOTE — Progress Notes (Signed)
Per discussion with psychiatrist and NP, patient psychiatrically stable for discharge home. Pt provided with outpatient substance abuse resources. Pt wife informed of patient discharge.   Belia Heman, [place] [employer]  [employer] [phone]

## 2014-12-29 NOTE — [facility] Suicide Risk Assessment (Cosign Needed)
Suicide Risk Assessment  Discharge Assessment   [facility] Discharge Suicide Risk Assessment   Demographic Factors:  Male, Caucasian and Low socioeconomic status  Total Time spent with patient: 20 minutes  Musculoskeletal: Strength & Muscle Tone: within normal limits Gait & Station: normal Patient leans: N/A  Psychiatric Specialty Exam:     Blood pressure 146/94, pulse 115, temperature 98.2 F (36.8 C), temperature source Oral, resp. rate 16, SpO2 100 %.There is no weight on file to calculate BMI.  General Appearance: Casual  Eye Contact:: Good  Speech: Clear and Coherent and Normal Rate  Volume: Normal  Mood: Anxious  Affect: Congruent  Thought Process: Coherent, Goal Directed and Intact  Orientation: Full (Time, Place, and Person)  Thought Content: WDL  Suicidal Thoughts: No  Homicidal Thoughts: No  Memory: Immediate; Good Recent; Good Remote; Good  Judgement: Fair  Insight: Fair  Psychomotor Activity: Normal  Concentration: Fair  Recall: NA  Fund of Knowledge:Fair  Language: Good  Akathisia: NA  Handed: Right  AIMS (if indicated):    Assets: Desire for Improvement  ADL's: Intact  Cognition: WNL            Has this patient used any form of tobacco in the last 30 days? (Cigarettes, Smokeless Tobacco, Cigars, and/or Pipes) Yes, A prescription for an FDA-approved tobacco cessation medication was offered at discharge and the patient refused  Mental Status Per Nursing Assessment::   On Admission:     Current Mental Status by Physician: NA  Loss Factors: NA  Historical Factors: Prior suicide attempts  Risk Reduction Factors:   Responsible for children under 40 years of age, Sense of responsibility to family, Living with another person, especially a relative and Positive therapeutic relationship  Continued Clinical Symptoms:  Panic Attacks Alcohol/Substance Abuse/Dependencies  Cognitive Features That  Contribute To Risk:  Polarized thinking    Suicide Risk:  Minimal: No identifiable suicidal ideation.  Patients presenting with no risk factors but with morbid ruminations; may be classified as minimal risk based on the severity of the depressive symptoms  Principal Problem: Alcohol use disorder, severe, dependence Discharge Diagnoses:  Patient Active Problem List   Diagnosis Date Noted  . Alcohol use disorder, severe, dependence [F10.20] 12/29/2014    Priority: High  . Eczema [L30.9] 06/27/2014  . GERD (gastroesophageal reflux disease) [K21.9] 05/24/2014  . Snoring [R06.83] 04/07/2013  . Suicide attempt by substance overdose [T65.92XA] 02/10/2013  . Hypotension arterial [I95.9] 02/10/2013  . Alcohol abuse [F10.10] 12/31/2012  . Erectile dysfunction [N52.9] 12/09/2012  . Hematuria [R31.9] 06/11/2012  . Vision problem [H54.7] 02/02/2012  . Depression with anxiety [F41.8] 11/13/2010  . Hyperlipemia [E78.5] 06/27/2010  . Diabetes type 2, uncontrolled [E11.65] 05/22/2010  . Essential hypertension [I10] 05/22/2010      Plan Of Care/Follow-up recommendations:  Activity:  As tolerated Diet:  Regular  Is patient on multiple antipsychotic therapies at discharge:  No   Has Patient had three or more failed trials of antipsychotic monotherapy by history:  No  Recommended Plan for Multiple Antipsychotic Therapies: NA    Gabriela Irigoyen C   PMHNP-BC 12/29/2014, 10:47 AM

## 2015-01-02 ENCOUNTER — Encounter: Payer: Self-pay | Admitting: Family

## 2015-01-02 DIAGNOSIS — M25551 Pain in right hip: Secondary | ICD-10-CM

## 2015-01-02 DIAGNOSIS — M25521 Pain in right elbow: Secondary | ICD-10-CM

## 2015-01-13 ENCOUNTER — Encounter: Payer: Self-pay | Admitting: Family

## 2015-01-17 ENCOUNTER — Ambulatory Visit: Payer: Self-pay | Admitting: Endocrinology

## 2015-02-13 ENCOUNTER — Other Ambulatory Visit: Payer: Self-pay | Admitting: Family Medicine

## 2015-02-13 ENCOUNTER — Other Ambulatory Visit: Payer: Self-pay | Admitting: Family

## 2015-02-14 ENCOUNTER — Other Ambulatory Visit: Payer: 59

## 2015-02-16 ENCOUNTER — Emergency Department (HOSPITAL_BASED_OUTPATIENT_CLINIC_OR_DEPARTMENT_OTHER)
Admission: EM | Admit: 2015-02-16 | Discharge: 2015-02-16 | Disposition: A | Discharge status: Home / Self Care | Payer: 59 | Attending: Emergency Medicine | Admitting: Emergency Medicine

## 2015-02-16 ENCOUNTER — Emergency Department (HOSPITAL_BASED_OUTPATIENT_CLINIC_OR_DEPARTMENT_OTHER): Payer: 59

## 2015-02-16 ENCOUNTER — Encounter (HOSPITAL_BASED_OUTPATIENT_CLINIC_OR_DEPARTMENT_OTHER): Payer: Self-pay | Admitting: *Deleted

## 2015-02-16 DIAGNOSIS — Y92410 Unspecified street and highway as the place of occurrence of the external cause: Secondary | ICD-10-CM | POA: Insufficient documentation

## 2015-02-16 DIAGNOSIS — S29019A Strain of muscle and tendon of unspecified wall of thorax, initial encounter: Secondary | ICD-10-CM

## 2015-02-16 DIAGNOSIS — Y998 Other external cause status: Secondary | ICD-10-CM | POA: Diagnosis not present

## 2015-02-16 DIAGNOSIS — K219 Gastro-esophageal reflux disease without esophagitis: Secondary | ICD-10-CM | POA: Diagnosis not present

## 2015-02-16 DIAGNOSIS — F329 Major depressive disorder, single episode, unspecified: Secondary | ICD-10-CM | POA: Insufficient documentation

## 2015-02-16 DIAGNOSIS — S161XXA Strain of muscle, fascia and tendon at neck level, initial encounter: Secondary | ICD-10-CM | POA: Diagnosis not present

## 2015-02-16 DIAGNOSIS — E7800 Pure hypercholesterolemia, unspecified: Secondary | ICD-10-CM | POA: Diagnosis not present

## 2015-02-16 DIAGNOSIS — S29012A Strain of muscle and tendon of back wall of thorax, initial encounter: Secondary | ICD-10-CM | POA: Diagnosis not present

## 2015-02-16 DIAGNOSIS — S0990XA Unspecified injury of head, initial encounter: Secondary | ICD-10-CM | POA: Diagnosis not present

## 2015-02-16 DIAGNOSIS — Z7982 Long term (current) use of aspirin: Secondary | ICD-10-CM | POA: Diagnosis not present

## 2015-02-16 DIAGNOSIS — Z79899 Other long term (current) drug therapy: Secondary | ICD-10-CM | POA: Insufficient documentation

## 2015-02-16 DIAGNOSIS — S199XXA Unspecified injury of neck, initial encounter: Secondary | ICD-10-CM | POA: Diagnosis present

## 2015-02-16 DIAGNOSIS — Z8669 Personal history of other diseases of the nervous system and sense organs: Secondary | ICD-10-CM | POA: Insufficient documentation

## 2015-02-16 DIAGNOSIS — Z794 Long term (current) use of insulin: Secondary | ICD-10-CM | POA: Insufficient documentation

## 2015-02-16 DIAGNOSIS — I10 Essential (primary) hypertension: Secondary | ICD-10-CM | POA: Insufficient documentation

## 2015-02-16 DIAGNOSIS — E119 Type 2 diabetes mellitus without complications: Secondary | ICD-10-CM | POA: Diagnosis not present

## 2015-02-16 DIAGNOSIS — Y9389 Activity, other specified: Secondary | ICD-10-CM | POA: Insufficient documentation

## 2015-02-16 DIAGNOSIS — S3992XA Unspecified injury of lower back, initial encounter: Secondary | ICD-10-CM | POA: Insufficient documentation

## 2015-02-16 MED ORDER — KETOROLAC TROMETHAMINE 60 MG/2ML IM SOLN
60.0000 mg | Freq: Once | INTRAMUSCULAR | Status: AC
  Administered 2015-02-16: 60 mg via INTRAMUSCULAR
  Filled 2015-02-16: qty 2

## 2015-02-16 MED ORDER — CYCLOBENZAPRINE HCL 10 MG PO TABS: 10.0000 mg | ORAL_TABLET | Freq: Three times a day (TID) | ORAL | Status: DC | PRN
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
## 2015-03-16 ENCOUNTER — Other Ambulatory Visit: Payer: Self-pay | Admitting: Family

## 2015-03-17 NOTE — Telephone Encounter (Signed)
Pt request refill of Citalopram. Last seen by PCP in August and has no future appts scheduled. How many refills can we give and when should pt follow up in the office?

## 2015-03-17 NOTE — Telephone Encounter (Signed)
Ok to give 1 refill.  Due for follow up.

## 2015-03-17 NOTE — Telephone Encounter (Signed)
30 day supply sent to pharmacy. Please call pt to scheduled f/u with [name] before further refills are due.

## 2015-03-20 NOTE — Telephone Encounter (Signed)
scheduled

## 2015-03-21 ENCOUNTER — Encounter: Payer: Self-pay | Admitting: Family

## 2015-03-21 ENCOUNTER — Ambulatory Visit (INDEPENDENT_AMBULATORY_CARE_PROVIDER_SITE_OTHER): Payer: Self-pay | Admitting: Family

## 2015-03-21 VITALS — BP 150/110 | HR 100 | Temp 97.7°F | Resp 16 | Ht 72.0 in | Wt 212.8 lb

## 2015-03-21 DIAGNOSIS — K429 Umbilical hernia without obstruction or gangrene: Secondary | ICD-10-CM

## 2015-03-21 DIAGNOSIS — Z794 Long term (current) use of insulin: Secondary | ICD-10-CM

## 2015-03-21 DIAGNOSIS — F418 Other specified anxiety disorders: Secondary | ICD-10-CM

## 2015-03-21 DIAGNOSIS — R109 Unspecified abdominal pain: Secondary | ICD-10-CM

## 2015-03-21 DIAGNOSIS — E1310 Other specified diabetes mellitus with ketoacidosis without coma: Secondary | ICD-10-CM

## 2015-03-21 DIAGNOSIS — Z23 Encounter for immunization: Secondary | ICD-10-CM

## 2015-03-21 DIAGNOSIS — E1110 Type 2 diabetes mellitus with ketoacidosis without coma: Secondary | ICD-10-CM

## 2015-03-21 DIAGNOSIS — I10 Essential (primary) hypertension: Secondary | ICD-10-CM

## 2015-03-21 LAB — LIPASE: Lipase: 61 U/L — ABNORMAL HIGH (ref 11.0–59.0)

## 2015-03-21 LAB — CBC WITH DIFFERENTIAL/PLATELET
BASOS PCT: 0.5 % (ref 0.0–3.0)
Basophils Absolute: 0 10*3/uL (ref 0.0–0.1)
EOS ABS: 0.1 10*3/uL (ref 0.0–0.7)
Eosinophils Relative: 0.9 % (ref 0.0–5.0)
HEMATOCRIT: 48.2 % (ref 39.0–52.0)
Hemoglobin: 16.5 g/dL (ref 13.0–17.0)
LYMPHS ABS: 1.4 10*3/uL (ref 0.7–4.0)
LYMPHS PCT: 17.4 % (ref 12.0–46.0)
MCHC: 34.2 g/dL (ref 30.0–36.0)
MCV: 86.4 fl (ref 78.0–100.0)
Monocytes Absolute: 0.7 10*3/uL (ref 0.1–1.0)
Monocytes Relative: 8.9 % (ref 3.0–12.0)
NEUTROS ABS: 5.6 10*3/uL (ref 1.4–7.7)
NEUTROS PCT: 72.3 % (ref 43.0–77.0)
Platelets: 360 10*3/uL (ref 150.0–400.0)
RBC: 5.58 Mil/uL (ref 4.22–5.81)
RDW: 12.8 % (ref 11.5–15.5)
WBC: 7.8 10*3/uL (ref 4.0–10.5)

## 2015-03-21 LAB — COMPREHENSIVE METABOLIC PANEL
ALK PHOS: 126 U/L — AB (ref 39–117)
ALT: 59 U/L — AB (ref 0–53)
AST: 62 U/L — AB (ref 0–37)
Albumin: 4.4 g/dL (ref 3.5–5.2)
BILIRUBIN TOTAL: 2.5 mg/dL — AB (ref 0.2–1.2)
BUN: 8 mg/dL (ref 6–23)
CO2: 27 meq/L (ref 19–32)
CREATININE: 0.78 mg/dL (ref 0.40–1.50)
Calcium: 9.4 mg/dL (ref 8.4–10.5)
Chloride: 97 mEq/L (ref 96–112)
GFR: 142.94 mL/min (ref >60.00)
GLUCOSE: 252 mg/dL — AB (ref 70–99)
Potassium: 3.2 mEq/L — ABNORMAL LOW (ref 3.5–5.1)
SODIUM: 135 meq/L (ref 135–145)
TOTAL PROTEIN: 7.9 g/dL (ref 6.0–8.3)

## 2015-03-21 LAB — HEMOGLOBIN A1C: HEMOGLOBIN A1C: 10.7 % — AB (ref 4.6–6.5)

## 2015-03-21 MED ORDER — OXYCODONE HCL 5 MG PO TABS: 5.0000 mg | ORAL_TABLET | Freq: Four times a day (QID) | ORAL | Status: DC | PRN

## 2015-03-21 MED ORDER — AMLODIPINE BESYLATE 5 MG PO TABS: 5.0000 mg | ORAL_TABLET | Freq: Every day | ORAL | Status: DC

## 2015-03-21 MED ORDER — POTASSIUM CHLORIDE CRYS ER 10 MEQ PO TBCR: 20.0000 meq | EXTENDED_RELEASE_TABLET | Freq: Two times a day (BID) | ORAL | Status: DC

## 2015-03-21 MED ORDER — CITALOPRAM HYDROBROMIDE 40 MG PO TABS: 40.0000 mg | ORAL_TABLET | Freq: Every day | ORAL | Status: DC

## 2015-03-21 NOTE — Patient Instructions (Signed)
Increase citalopram from 20mg  to 40mg . Add amlodipine 5mg  once daily for blood pressure  Stop anti-inflammatories (motrin). Avoid all alcohol. OK to take tylenol, but not more than 3000mg /24 hrs. You will be contacted about your referral to the surgeon to discuss your umbilical hernai.  Follow up in 1 week.

## 2015-03-21 NOTE — Assessment & Plan Note (Signed)
Obtain A1C, urine microablmumin, continue toujeo

## 2015-03-21 NOTE — Assessment & Plan Note (Signed)
Uncontrolled, increase citalopram 20mg .

## 2015-03-21 NOTE — Progress Notes (Signed)
Pre visit review using our clinic review tool, if applicable. No additional management support is needed unless otherwise documented below in the visit note. 

## 2015-03-21 NOTE — Progress Notes (Signed)
Subjective:    Patient ID: Willie [name], male    DOB: 1976-03-09, 39 y.o.   MRN: [id number]  HPI  Mr. Steininger is a 39 yr old male who presents today for follow up.  1) Depression/anxiety- Current meds include citalopram.  Ran out of citalopram 2 days ago. Feels increase stress with the holidays.  Has family stress, work stress Animal nutritionist).  Denies SI/HI  2) DM2- not checking sugars lately. Reports that he is using 13 units of toujeo.  Denies low blood sugars.   Lab Results  Component Value Date   HGBA1C 10.0* 11/09/2014   HGBA1C 15.0* 05/24/2014   HGBA1C 7.6* 08/04/2012   Lab Results  Component Value Date   MICROALBUR 1.0 05/24/2014   LDLCALC 59 11/23/2014   CREATININE 0.80 12/28/2014    3) Abdominal Pain- reports periumbilical pain into epigastrium x 1 week, worse after eating. Using ibuprofen, and tylenol without improvement in his symptoms.    4) HTN-  Reports BP has been running high.  Review of Systems See HPI  Past Medical History  Diagnosis Date  . Alcoholic pancreatitis     recurrent  . Diabetes mellitus, type II ([place])     New Onset 03/2010  . Hypertension   . 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 High cholesterol   . Sleep apnea   . GERD (gastroesophageal reflux disease)   . Depression     Social History   Social History  . Marital Status: Legally Separated    Spouse Name: N/A  . Number of Children: 2  . 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 Smoking status: Never Smoker   . Smokeless tobacco: Never Used  . Alcohol Use: Yes     Comment: last drink on 11/15/14  . Drug Use: No  . Sexual Activity: Not on file   Other Topics Concern  . 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 Lumbar microdiscectomy  ~ 2004    Dr Ellene Route    Family History  Problem Relation Age of Onset  . Diabetes Mother   . Lung cancer Brother     twin brother  . Colon cancer Neg Hx   . Pancreatic cancer Paternal Aunt     Allergies  Allergen Reactions  . Invokamet [Canagliflozin-Metformin Hcl] Other (See Comments)    Lactic acidosis  . Metformin And Related     Drastic drop in blood sugar    Current Outpatient Prescriptions on File Prior to Visit  Medication Sig Dispense Refill  . aspirin EC 81 MG tablet Take 1 tablet (81 mg total) by mouth daily.    Marland Kitchen atorvastatin (LIPITOR) 20 MG tablet TAKE 1 TABLET (20 MG TOTAL) BY MOUTH DAILY. 30 tablet 2  . BD PEN NEEDLE NANO U/F 32G X 4 MM MISC USE ONCE A DAY TO INJECT INSULIN 100 each 0  . citalopram (CELEXA) 20 MG tablet TAKE 1 TABLET BY MOUTH DAILY 30 tablet 0  .  ibuprofen (ADVIL,MOTRIN) 200 MG tablet Take 600 mg by mouth every 8 (eight) hours as needed for moderate pain.    Marland Kitchen lisinopril (PRINIVIL,ZESTRIL) 20 MG tablet Take 1 tablet (20 mg total) by mouth daily. 30 tablet 5  . Multiple Vitamin (MULTIVITAMIN WITH MINERALS) TABS tablet Take 2 tablets by mouth daily.    Marland Kitchen oxyCODONE (OXY IR/ROXICODONE) 5 MG immediate release tablet Take 1 tablet (5 mg total) by mouth every 6 (six) hours as needed (for pain). 20 tablet 0  . pantoprazole (PROTONIX) 40 MG tablet TAKE 1 TABLET (40 MG TOTAL) BY MOUTH DAILY. 30 tablet 5  . potassium chloride SA (K-DUR,KLOR-CON) 20 MEQ tablet Take 1 tablet (20 mEq total) by mouth daily. 30 tablet 3  . promethazine (PHENERGAN) 25 MG tablet Take 1 tablet (25 mg total) by mouth every 6 (six) hours as needed for nausea or vomiting. 20 tablet 0  . tadalafil (CIALIS) 10 MG tablet Take 1 tablet (10 mg total) by mouth daily as needed for erectile dysfunction. 10 tablet 5  . TOUJEO SOLOSTAR 300 UNIT/ML SOPN  INJECT 10 UNITS SUBCUTANEOUSLY ONCE A DAY. 4.5 mL 2  . valACYclovir (VALTREX) 500 MG tablet Take 1 tablet (500 mg total) by mouth daily. 30 tablet 5   No current facility-administered medications on file prior to visit.    BP 156/110 mmHg  Pulse 100  Temp(Src) 97.7 F (36.5 C) (Oral)  Resp 16  Ht 6' (1.829 m)  Wt 212 lb 12.8 oz (96.525 kg)  BMI 28.85 kg/m2  SpO2 100%       Objective:   Physical Exam  Constitutional: He is oriented to person, place, and time. He appears well-developed and well-nourished. No distress.  HENT:  Head: Normocephalic and atraumatic.  Cardiovascular: Normal rate and regular rhythm.   No murmur heard. Pulmonary/Chest: Effort normal and breath sounds normal. No respiratory distress. He has no wheezes. He has no rales.  Abdominal: Soft. Bowel sounds are normal. He exhibits no distension. There is tenderness in the epigastric area and left upper quadrant. There is no guarding.  Reducible umbilical hernia   Musculoskeletal: He exhibits no edema.  Neurological: He is alert and oriented to person, place, and time.  Skin: Skin is warm and dry.  Psychiatric: He has a normal mood and affect. His behavior is normal. Thought content normal.          Assessment & Plan:  Abdominal pain-  I think periumbilical pain is due to his umbilical hernia.  Epigastric/LUQ pain is  Most likely either due to possible gastritis/ulcer from recent NSAID abuse or due to recurrent pancreatitis. Will obtain H pylori, LFT, Lipase, refer to GI  (for epigastric pain) and surgery (for hernia). Pt give a 1 time rx for roxicodone.  He understands to go to the ER for severe/worsening abdominal pain. Flu shot today.

## 2015-03-21 NOTE — Assessment & Plan Note (Signed)
Uncontrolled. Add amlodipine.  

## 2015-03-22 ENCOUNTER — Telehealth: Payer: Self-pay | Admitting: Family

## 2015-03-22 LAB — H. PYLORI ANTIBODY, IGG: H PYLORI IGG: NEGATIVE

## 2015-03-22 NOTE — Telephone Encounter (Signed)
Notified pt and he voices understanding. States pharmacy filled old potassium Rx of 87meq twice a day. He will take 1 tablet now, 1 in 4 hours and resume 1 tablet twice a day. Scheduled follow up for Monday at 8am.

## 2015-03-22 NOTE — Telephone Encounter (Signed)
Potassium is low. Advise pt to take 2 tabs now, 2 tabs in 4 hours, then resume one tab twice daily. Lipase is mildly elevated indicating mild pancreatitis. Advise pt to avoid all alcohol, switch to clear liquids for the next few days until abdominal pain is improved, then go to full liquid, then bland diet until seen back in the office.  I would like to see him back on Monday please.  Go to ER if severe/worsening abdominal pain.

## 2015-03-29 ENCOUNTER — Ambulatory Visit: Payer: Self-pay | Admitting: Family

## 2015-03-29 ENCOUNTER — Telehealth: Payer: Self-pay | Admitting: Family

## 2015-03-29 DIAGNOSIS — Z0289 Encounter for other administrative examinations: Secondary | ICD-10-CM

## 2015-04-05 NOTE — Telephone Encounter (Signed)
Pt was no show 03/29/15 8:15am for follow up appt, pt did not reschedule, 2nd no show I see, charge or no charge?

## 2015-04-05 NOTE — Telephone Encounter (Signed)
Charge please 

## 2015-04-07 ENCOUNTER — Ambulatory Visit: Payer: Self-pay | Admitting: Physician Assistant

## 2015-04-16 ENCOUNTER — Emergency Department (HOSPITAL_BASED_OUTPATIENT_CLINIC_OR_DEPARTMENT_OTHER)
Admission: EM | Admit: 2015-04-16 | Discharge: 2015-04-16 | Disposition: A | Discharge status: Home / Self Care | Payer: 59 | Attending: Emergency Medicine | Admitting: Emergency Medicine

## 2015-04-16 ENCOUNTER — Encounter (HOSPITAL_BASED_OUTPATIENT_CLINIC_OR_DEPARTMENT_OTHER): Payer: Self-pay | Admitting: Emergency Medicine

## 2015-04-16 DIAGNOSIS — I10 Essential (primary) hypertension: Secondary | ICD-10-CM | POA: Insufficient documentation

## 2015-04-16 DIAGNOSIS — K219 Gastro-esophageal reflux disease without esophagitis: Secondary | ICD-10-CM | POA: Diagnosis not present

## 2015-04-16 DIAGNOSIS — Z8669 Personal history of other diseases of the nervous system and sense organs: Secondary | ICD-10-CM | POA: Insufficient documentation

## 2015-04-16 DIAGNOSIS — K859 Acute pancreatitis without necrosis or infection, unspecified: Secondary | ICD-10-CM | POA: Diagnosis not present

## 2015-04-16 DIAGNOSIS — F329 Major depressive disorder, single episode, unspecified: Secondary | ICD-10-CM | POA: Diagnosis not present

## 2015-04-16 DIAGNOSIS — E7800 Pure hypercholesterolemia, unspecified: Secondary | ICD-10-CM | POA: Insufficient documentation

## 2015-04-16 DIAGNOSIS — Z7982 Long term (current) use of aspirin: Secondary | ICD-10-CM | POA: Insufficient documentation

## 2015-04-16 DIAGNOSIS — Z79899 Other long term (current) drug therapy: Secondary | ICD-10-CM | POA: Insufficient documentation

## 2015-04-16 DIAGNOSIS — R109 Unspecified abdominal pain: Secondary | ICD-10-CM | POA: Diagnosis present

## 2015-04-16 DIAGNOSIS — E119 Type 2 diabetes mellitus without complications: Secondary | ICD-10-CM | POA: Insufficient documentation

## 2015-04-16 LAB — COMPREHENSIVE METABOLIC PANEL
ALBUMIN: 4.4 g/dL (ref 3.5–5.0)
ALT: 41 U/L (ref 17–63)
AST: 39 U/L (ref 15–41)
Alkaline Phosphatase: 86 U/L (ref 38–126)
Anion gap: 9 (ref 5–15)
BUN: 11 mg/dL (ref 6–20)
CHLORIDE: 96 mmol/L — AB (ref 101–111)
CO2: 25 mmol/L (ref 22–32)
CREATININE: 0.93 mg/dL (ref 0.61–1.24)
Calcium: 9 mg/dL (ref 8.9–10.3)
GFR calc non Af Amer: >60 mL/min (ref >60)
GLUCOSE: 342 mg/dL — AB (ref 65–99)
Potassium: 3.9 mmol/L (ref 3.5–5.1)
SODIUM: 130 mmol/L — AB (ref 135–145)
Total Bilirubin: 2 mg/dL — ABNORMAL HIGH (ref 0.3–1.2)
Total Protein: 7.6 g/dL (ref 6.5–8.1)

## 2015-04-16 LAB — CBC
HCT: 42.8 % (ref 39.0–52.0)
HEMOGLOBIN: 15.3 g/dL (ref 13.0–17.0)
MCH: 29.1 pg (ref 26.0–34.0)
MCHC: 35.7 g/dL (ref 30.0–36.0)
MCV: 81.5 fL (ref 78.0–100.0)
PLATELETS: 303 10*3/uL (ref 150–400)
RBC: 5.25 MIL/uL (ref 4.22–5.81)
RDW: 12.3 % (ref 11.5–15.5)
WBC: 8.7 10*3/uL (ref 4.0–10.5)

## 2015-04-16 LAB — URINALYSIS, ROUTINE W REFLEX MICROSCOPIC
BILIRUBIN URINE: NEGATIVE
HGB URINE DIPSTICK: NEGATIVE
Ketones, ur: >80 mg/dL — AB
Leukocytes, UA: NEGATIVE
Nitrite: NEGATIVE
Protein, ur: NEGATIVE mg/dL
pH: 5.5 (ref 5.0–8.0)

## 2015-04-16 LAB — URINE MICROSCOPIC-ADD ON: Squamous Epithelial / LPF: NONE SEEN

## 2015-04-16 LAB — LIPASE, BLOOD: LIPASE: 183 U/L — AB (ref 11–51)

## 2015-04-16 MED ORDER — OXYCODONE-ACETAMINOPHEN 5-325 MG PO TABS: 1.0000 | ORAL_TABLET | ORAL | Status: DC | PRN

## 2015-04-16 MED ORDER — MORPHINE SULFATE (PF) 4 MG/ML IV SOLN
4.0000 mg | Freq: Once | INTRAVENOUS | Status: AC
  Administered 2015-04-16: 4 mg via INTRAVENOUS
  Filled 2015-04-16: qty 1

## 2015-04-16 MED ORDER — SODIUM CHLORIDE 0.9 % IV BOLUS (SEPSIS)
1000.0000 mL | Freq: Once | INTRAVENOUS | Status: AC
  Administered 2015-04-16: 1000 mL via INTRAVENOUS

## 2015-04-16 MED ORDER — ONDANSETRON HCL 4 MG/2ML IJ SOLN
4.0000 mg | Freq: Once | INTRAMUSCULAR | Status: AC | PRN
  Administered 2015-04-16: 4 mg via INTRAVENOUS
  Filled 2015-04-16: qty 2

## 2015-04-16 NOTE — ED Notes (Signed)
Patient states that for the last 2 days her has had pain to his right upper quadrant area. Reports that he has had pancreatitis before and it feels like that again.

## 2015-04-16 NOTE — ED Provider Notes (Signed)
CSN: [id number]     Arrival date & time 04/16/15  1535 History   First MD Initiated Contact with Patient 04/16/15 1617     Chief Complaint  Patient presents with  . Abdominal Pain    HPI   39 year old male presents today with acute onset of abdominal pain. Patient has a history of pancreatitis, alcohol induced. Patient reports that 3 days ago he had an episode of drinking alcohol resulting in abdominal pain the next day. He reports pain continued to persist, epigastric in nature with belching, burping, stabbing in nature, this is identical to previous episodes. He reports decreased appetite but has been able to tolerate liquids without difficulty. Patient denies any fever, chills, altered mental status, nausea or vomiting, lower abdominal pain, changes in bowel or bladder characteristics or frequency. Patient denies any exposure to abnormal food or drinks, has not been using any over-the-counter medications other than Tylenol and ibuprofen for the acute episode. He reports hospitalization of his first episode of pancreatitis, he feels this is nowhere near as severe.   Past Medical History  Diagnosis Date  . Alcoholic pancreatitis     recurrent  . Diabetes mellitus, type II ([place])     New Onset 03/2010  . Hypertension   . 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 High cholesterol   . Sleep apnea   . GERD (gastroesophageal reflux disease)   . Depression    Past Surgical History  Procedure Laterality Date  . Lumbar microdiscectomy  ~ 2004    Dr Ellene Route   Family History  Problem Relation Age of Onset  . Diabetes Mother   . Lung cancer Brother     twin brother  . Colon cancer Neg Hx   . Pancreatic cancer Paternal Aunt    Social History  Substance Use Topics  . Smoking status: Never Smoker   . Smokeless tobacco: Never Used  . Alcohol Use: Yes     Comment: last drink 3 days ago    Review of Systems  All other systems reviewed and  are negative.   Allergies  Invokamet and Metformin and related  Home Medications   Prior to Admission medications   Medication Sig Start Date End Date Taking? Authorizing Provider  amLODipine (NORVASC) 5 MG tablet Take 1 tablet (5 mg total) by mouth daily. 03/21/15   Debbrah Alar, NP  aspirin EC 81 MG tablet Take 1 tablet (81 mg total) by mouth daily. 11/09/14   Debbrah Alar, NP  atorvastatin (LIPITOR) 20 MG tablet TAKE 1 TABLET (20 MG TOTAL) BY MOUTH DAILY. 02/13/15   Debbrah Alar, NP  BD PEN NEEDLE NANO U/F 32G X 4 MM MISC USE ONCE A DAY TO INJECT INSULIN 02/13/15   Debbrah Alar, NP  citalopram (CELEXA) 40 MG tablet Take 1 tablet (40 mg total) by mouth daily. 03/21/15   Debbrah Alar, NP  ibuprofen (ADVIL,MOTRIN) 200 MG tablet Take 600 mg by mouth every 8 (eight) hours as needed for moderate pain.    Historical Provider, MD  lisinopril (PRINIVIL,ZESTRIL) 20 MG tablet Take 1 tablet (20 mg total) by mouth daily. 11/09/14   Debbrah Alar, NP  Multiple Vitamin (MULTIVITAMIN WITH MINERALS) TABS tablet Take 2 tablets by mouth daily.    Historical Provider, MD  oxyCODONE (OXY IR/ROXICODONE) 5 MG immediate release tablet Take 1 tablet (5 mg total) by mouth every 6 (six) hours as needed (for pain). 03/21/15  Debbrah Alar, NP  oxyCODONE-acetaminophen (PERCOCET/ROXICET) 5-325 MG tablet Take 1 tablet by mouth every 4 (four) hours as needed for severe pain. 04/16/15   Okey Regal, PA-C  pantoprazole (PROTONIX) 40 MG tablet TAKE 1 TABLET (40 MG TOTAL) BY MOUTH DAILY. 11/09/14   Debbrah Alar, NP  potassium chloride (K-DUR,KLOR-CON) 10 MEQ tablet Take 2 tablets (20 mEq total) by mouth 2 (two) times daily. 03/21/15   Debbrah Alar, NP  promethazine (PHENERGAN) 25 MG tablet Take 1 tablet (25 mg total) by mouth every 6 (six) hours as needed for nausea or vomiting. 12/13/14   Debbrah Alar, NP  tadalafil (CIALIS) 10 MG tablet Take 1 tablet (10 mg total) by  mouth daily as needed for erectile dysfunction. 11/09/14   Debbrah Alar, NP  TOUJEO SOLOSTAR 300 UNIT/ML SOPN INJECT 10 UNITS SUBCUTANEOUSLY ONCE A DAY. 02/14/15   Debbrah Alar, NP  valACYclovir (VALTREX) 500 MG tablet Take 1 tablet (500 mg total) by mouth daily. 11/09/14   Debbrah Alar, NP   BP 133/99 mmHg  Pulse 91  Temp(Src) 98.4 F (36.9 C) (Oral)  Resp 18  Ht 6' (1.829 m)  Wt 97.523 kg  BMI 29.15 kg/m2  SpO2 97%   Physical Exam  Constitutional: He is oriented to person, place, and time. He appears well-developed and well-nourished.  HENT:  Head: Normocephalic and atraumatic.  Eyes: Conjunctivae are normal. Pupils are equal, round, and reactive to light. Right eye exhibits no discharge. Left eye exhibits no discharge. No scleral icterus.  Neck: Normal range of motion. No JVD present. No tracheal deviation present.  Pulmonary/Chest: Effort normal. No stridor.  Abdominal: Soft. Bowel sounds are normal. He exhibits no distension and no mass. There is tenderness. There is no rebound and no guarding.  Epigastric tenderness   Neurological: He is alert and oriented to person, place, and time. Coordination normal.  Skin: Skin is warm and dry. No rash noted. No erythema. No pallor.  Psychiatric: He has a normal mood and affect. His behavior is normal. Judgment and thought content normal.  Nursing note and vitals reviewed.     ED Course  Procedures (including critical care time) Labs Review Labs Reviewed  LIPASE, BLOOD - Abnormal; Notable for the following:    Lipase 183 (*)    All other components within normal limits  COMPREHENSIVE METABOLIC PANEL - Abnormal; Notable for the following:    Sodium 130 (*)    Chloride 96 (*)    Glucose, Bld 342 (*)    Total Bilirubin 2.0 (*)    All other components within normal limits  URINALYSIS, ROUTINE W REFLEX MICROSCOPIC (NOT AT [facility]) - Abnormal; Notable for the following:    Specific Gravity, Urine >1.046 (*)    Glucose,  UA >1000 (*)    Ketones, ur >80 (*)    All other components within normal limits  URINE MICROSCOPIC-ADD ON - Abnormal; Notable for the following:    Bacteria, UA RARE (*)    All other components within normal limits  CBC    Imaging Review No results found. I have personally reviewed and evaluated these images and lab results as part of my medical decision-making.   EKG Interpretation None      MDM   Final diagnoses:  Acute pancreatitis, unspecified pancreatitis type    Labs: Urinalysis, urine microscopic, lipase, CMP, CBC- significant for glucose of 342, lipase 183  Imaging:  Consults:  Therapeutics: NS, morphine  Discharge Meds: Percocet  Assessment/Plan: 39 year old male. History of alcoholic pancreatitis.  Presents today with an acute episode of pancreatitis as evidenced by lipase at 183 and abdominal pain. Patient is afebrile, nontoxic appearing, in no acute distress. He has no elevation in white count, no changes in LFTs, tolerating by mouth without difficulty. Patient had a glucose of 342 was given a liter of normal saline. He was made aware of his elevated CBG and need for management of type 2 diabetes. Patient requesting home management of pancreatic episode, he'll be given a short course of narcotic pain medication and encouraged to drink clear liquids, follow-up with primary care provider in 2 days for reevaluation, return to the emergency room immediately if any worsening signs or symptoms presented. Patient verbalized understanding and agreement for today's plan and had no further questions or concerns at time of discharge        Okey Regal, PA-C 04/16/15 [place], MD 04/16/15 2231

## 2015-04-16 NOTE — Discharge Instructions (Signed)
Acute Pancreatitis Acute pancreatitis is a disease in which the pancreas becomes suddenly inflamed. The pancreas is a large gland located behind your stomach. The pancreas produces enzymes that help digest food. The pancreas also releases the hormones glucagon and insulin that help regulate blood sugar. Damage to the pancreas occurs when the digestive enzymes from the pancreas are activated and begin attacking the pancreas before being released into the intestine. Most acute attacks last a couple of days and can cause serious complications. Some people become dehydrated and develop low blood pressure. In severe cases, bleeding into the pancreas can lead to shock and can be life-threatening. The lungs, heart, and kidneys may fail. CAUSES  Pancreatitis can happen to anyone. In some cases, the cause is unknown. Most cases are caused by:  Alcohol abuse.  Gallstones. Other less common causes are:  Certain medicines.  Exposure to certain chemicals.  Infection.  Damage caused by an accident (trauma).  Abdominal surgery. SYMPTOMS   Pain in the upper abdomen that may radiate to the back.  Tenderness and swelling of the abdomen.  Nausea and vomiting. DIAGNOSIS  Your caregiver will perform a physical exam. Blood and stool tests may be done to confirm the diagnosis. Imaging tests may also be done, such as X-rays, CT scans, or an ultrasound of the abdomen. TREATMENT  Treatment usually requires a stay in the hospital. Treatment may include:  Pain medicine.  Fluid replacement through an intravenous line (IV).  Placing a tube in the stomach to remove stomach contents and control vomiting.  Not eating for 3 or 4 days. This gives your pancreas a rest, because enzymes are not being produced that can cause further damage.  Antibiotic medicines if your condition is caused by an infection.  Surgery of the pancreas or gallbladder. HOME CARE INSTRUCTIONS   Follow the diet advised by your  caregiver. This may involve avoiding alcohol and decreasing the amount of fat in your diet.  Eat smaller, more frequent meals. This reduces the amount of digestive juices the pancreas produces.  Drink enough fluids to keep your urine clear or pale yellow.  Only take over-the-counter or prescription medicines as directed by your caregiver.  Avoid drinking alcohol if it caused your condition.  Do not smoke.  Get plenty of rest.  Check your blood sugar at home as directed by your caregiver.  Keep all follow-up appointments as directed by your caregiver. SEEK MEDICAL CARE IF:   You do not recover as quickly as expected.  You develop new or worsening symptoms.  You have persistent pain, weakness, or nausea.  You recover and then have another episode of pain. SEEK IMMEDIATE MEDICAL CARE IF:   You are unable to eat or keep fluids down.  Your pain becomes severe.  You have a fever or persistent symptoms for more than 2 to 3 days.  You have a fever and your symptoms suddenly get worse.  Your skin or the white part of your eyes turn yellow (jaundice).  You develop vomiting.  You feel dizzy, or you faint.  Your blood sugar is high (over 300 mg/dL). MAKE SURE YOU:   Understand these instructions.  Will watch your condition.  Will get help right away if you are not doing well or get worse.   This information is not intended to replace advice given to you by your health care provider. Make sure you discuss any questions you have with your health care provider.   Document Released: 04/15/2005 Document Revised: 10/15/2011  Document Reviewed: 07/25/2011 Elsevier Interactive Patient Education [employer].  Please read attached information. If you experience any new or worsening signs or symptoms please return to the emergency room for evaluation. Please follow-up with your primary care provider or specialist as discussed. Please use medication prescribed only as directed  and discontinue taking if you have any concerning signs or symptoms.

## 2015-04-17 ENCOUNTER — Telehealth: Payer: Self-pay | Admitting: Family

## 2015-04-17 NOTE — Telephone Encounter (Signed)
Called pt, lvm informing him that a ED follow up is needed. Advised pt to call in to the office or schedule via My Chart    Thanks.

## 2015-04-17 NOTE — Telephone Encounter (Signed)
Please arrange ED follow up with me tomorrow.

## 2015-04-18 ENCOUNTER — Encounter: Payer: Self-pay | Admitting: Family

## 2015-04-18 ENCOUNTER — Ambulatory Visit (INDEPENDENT_AMBULATORY_CARE_PROVIDER_SITE_OTHER): Payer: 59 | Admitting: Family

## 2015-04-18 VITALS — BP 136/103 | HR 97 | Temp 98.3°F | Resp 16 | Ht 72.0 in | Wt 212.4 lb

## 2015-04-18 DIAGNOSIS — K859 Acute pancreatitis without necrosis or infection, unspecified: Secondary | ICD-10-CM | POA: Insufficient documentation

## 2015-04-18 DIAGNOSIS — F39 Unspecified mood [affective] disorder: Secondary | ICD-10-CM

## 2015-04-18 LAB — CBC WITH DIFFERENTIAL/PLATELET
Basophils Absolute: 0 10*3/uL (ref 0.0–0.1)
Basophils Relative: 0.5 % (ref 0.0–3.0)
EOS PCT: 2.2 % (ref 0.0–5.0)
Eosinophils Absolute: 0.2 10*3/uL (ref 0.0–0.7)
HCT: 44.1 % (ref 39.0–52.0)
Hemoglobin: 15 g/dL (ref 13.0–17.0)
LYMPHS ABS: 1.8 10*3/uL (ref 0.7–4.0)
Lymphocytes Relative: 21.6 % (ref 12.0–46.0)
MCHC: 34.1 g/dL (ref 30.0–36.0)
MCV: 86.5 fl (ref 78.0–100.0)
MONOS PCT: 6.1 % (ref 3.0–12.0)
Monocytes Absolute: 0.5 10*3/uL (ref 0.1–1.0)
NEUTROS ABS: 5.8 10*3/uL (ref 1.4–7.7)
NEUTROS PCT: 69.6 % (ref 43.0–77.0)
Platelets: 298 10*3/uL (ref 150.0–400.0)
RBC: 5.1 Mil/uL (ref 4.22–5.81)
RDW: 13 % (ref 11.5–15.5)
WBC: 8.3 10*3/uL (ref 4.0–10.5)

## 2015-04-18 LAB — COMPREHENSIVE METABOLIC PANEL
ALT: 30 U/L (ref 0–53)
AST: 22 U/L (ref 0–37)
Albumin: 4.4 g/dL (ref 3.5–5.2)
Alkaline Phosphatase: 92 U/L (ref 39–117)
BUN: 7 mg/dL (ref 6–23)
CHLORIDE: 95 meq/L — AB (ref 96–112)
CO2: 28 mEq/L (ref 19–32)
Calcium: 9.6 mg/dL (ref 8.4–10.5)
Creatinine, Ser: 0.8 mg/dL (ref 0.40–1.50)
GFR: 138.76 mL/min (ref >60.00)
GLUCOSE: 267 mg/dL — AB (ref 70–99)
POTASSIUM: 3.7 meq/L (ref 3.5–5.1)
SODIUM: 132 meq/L — AB (ref 135–145)
Total Bilirubin: 0.7 mg/dL (ref 0.2–1.2)
Total Protein: 7.9 g/dL (ref 6.0–8.3)

## 2015-04-18 LAB — LIPASE: LIPASE: 57 U/L (ref 11.0–59.0)

## 2015-04-18 MED ORDER — AMLODIPINE BESYLATE 10 MG PO TABS: 10.0000 mg | ORAL_TABLET | Freq: Every day | ORAL | Status: DC

## 2015-04-18 MED ORDER — ATORVASTATIN CALCIUM 20 MG PO TABS: ORAL_TABLET | ORAL | Status: DC

## 2015-04-18 MED ORDER — LISINOPRIL 20 MG PO TABS: 20.0000 mg | ORAL_TABLET | Freq: Every day | ORAL | Status: DC

## 2015-04-18 MED ORDER — ONDANSETRON HCL 4 MG PO TABS: 4.0000 mg | ORAL_TABLET | Freq: Three times a day (TID) | ORAL | Status: DC | PRN

## 2015-04-18 MED ORDER — OXYCODONE-ACETAMINOPHEN 5-325 MG PO TABS: 1.0000 | ORAL_TABLET | ORAL | Status: DC | PRN

## 2015-04-18 MED ORDER — CITALOPRAM HYDROBROMIDE 40 MG PO TABS: 40.0000 mg | ORAL_TABLET | Freq: Every day | ORAL | Status: DC

## 2015-04-18 MED ORDER — PANTOPRAZOLE SODIUM 40 MG PO TBEC: DELAYED_RELEASE_TABLET | ORAL | Status: DC

## 2015-04-18 NOTE — Assessment & Plan Note (Signed)
Advised pt to continue clear liquid diet. Refill provided for oxycodone.  zofran added prn nausea.

## 2015-04-18 NOTE — Progress Notes (Signed)
Pre visit review using our clinic review tool, if applicable. No additional management support is needed unless otherwise documented below in the visit note. 

## 2015-04-18 NOTE — Patient Instructions (Signed)
Increase amlodipine from 5mg  to 10mg . Continue clear fluids for next few days until your abdominal pain is improved.  When abdominal pain improves you may slowly advance diet to bland diet as tolerated.  Please contact psychiatry (see below) to arrange appointment.  I would also recommend that you establish with a counselor. We have counselors at our office. You can arrange an appointment by calling [phone]  Psychiatric Services:  [facility], [place] [address], [place], [address] [place], [place] [facility] [phone] [place], [place], [facility], [address] [place], [place]

## 2015-04-18 NOTE — Progress Notes (Signed)
Subjective:    Patient ID: Willie [name], male    DOB: 1975/07/31, 39 y.o.   MRN: [id number]  HPI  Willie [name] is a 39 yr old male who presents today for ED follow up.  He presented to the ED on 04/16/15 with acute onset of abdominal pain.  ED records are reviewed.  Lipase was elevated.  He was given a short course of narcotic pain medication. Reports epigastric pain began on Thursday following some alcohol consumption.  Tried tylenol and ibuprofen. Reports that he has been sleeping poorly due to the pain.  Reports feeling clammy.  Reports sugars have been running high 200-300 post prandial.  He is now taking 14 units of Toujeo which he takes in the AM.    Pain reports that his epigastric pain is improving but continues to have pain.  Last dose of percocet was at 3AM.  Reports feeling sweating, belching.  He is only taking clear fluids.  Reports that he has been drinking to help him sleep.    Depression- reports severe insomnia.  Stays up all night cleaning etc when he can't sleep.  Wife "thinks I am Bipolar."     Review of Systems See HPI  Past Medical History  Diagnosis Date  . Alcoholic pancreatitis     recurrent  . Diabetes mellitus, type II ([place])     New Onset 03/2010  . Hypertension   . 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 High cholesterol   . Sleep apnea   . GERD (gastroesophageal reflux disease)   . Depression     Social History   Social History  . Marital Status: Legally Separated    Spouse Name: N/A  . Number of Children: 2  . 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 Smoking status: Never Smoker   . Smokeless tobacco: Never Used  . Alcohol Use: Yes     Comment: last drink 3 days ago  . Drug Use: No  . Sexual Activity: Not on file   Other Topics Concern  . 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 Lumbar microdiscectomy  ~ 2004    Dr Ellene Route    Family History  Problem Relation Age of Onset  . Diabetes Mother   . Lung cancer Brother     twin brother  . Colon cancer Neg Hx   . Pancreatic cancer Paternal Aunt     Allergies  Allergen Reactions  . Invokamet [Canagliflozin-Metformin Hcl] Other (See Comments)    Lactic acidosis  . Metformin And Related     Drastic drop in blood sugar    Current Outpatient Prescriptions on File Prior to Visit  Medication Sig Dispense Refill  . amLODipine (NORVASC) 5 MG tablet Take 1 tablet (5 mg total) by mouth daily. 30 tablet 1  . aspirin EC 81 MG tablet Take 1 tablet (81 mg total) by mouth daily.    Marland Kitchen atorvastatin (LIPITOR) 20 MG tablet TAKE 1 TABLET (20  MG TOTAL) BY MOUTH DAILY. 30 tablet 2  . BD PEN NEEDLE NANO U/F 32G X 4 MM MISC USE ONCE A DAY TO INJECT INSULIN 100 each 0  . citalopram (CELEXA) 40 MG tablet Take 1 tablet (40 mg total) by mouth daily. 30 tablet 1  . ibuprofen (ADVIL,MOTRIN) 200 MG tablet Take 600 mg by mouth every 8 (eight) hours as needed for moderate pain.    Marland Kitchen lisinopril (PRINIVIL,ZESTRIL) 20 MG tablet Take 1 tablet (20 mg total) by mouth daily. 30 tablet 5  . Multiple Vitamin (MULTIVITAMIN WITH MINERALS) TABS tablet Take 2 tablets by mouth daily.    Marland Kitchen oxyCODONE-acetaminophen (PERCOCET/ROXICET) 5-325 MG tablet Take 1 tablet by mouth every 4 (four) hours as needed for severe pain. 20 tablet 0  . pantoprazole (PROTONIX) 40 MG tablet TAKE 1 TABLET (40 MG TOTAL) BY MOUTH DAILY. 30 tablet 5  . potassium chloride (K-DUR,KLOR-CON) 10 MEQ tablet Take 2 tablets (20 mEq total) by mouth 2 (two) times daily. 60 tablet 3  . tadalafil (CIALIS) 10 MG tablet Take 1 tablet (10 mg total) by mouth daily as needed for  erectile dysfunction. 10 tablet 5  . TOUJEO SOLOSTAR 300 UNIT/ML SOPN INJECT 10 UNITS SUBCUTANEOUSLY ONCE A DAY. (Patient taking differently: INJECT 13 UNITS SUBCUTANEOUSLY ONCE A DAY.) 4.5 mL 2  . valACYclovir (VALTREX) 500 MG tablet Take 1 tablet (500 mg total) by mouth daily. 30 tablet 5  . promethazine (PHENERGAN) 25 MG tablet Take 1 tablet (25 mg total) by mouth every 6 (six) hours as needed for nausea or vomiting. (Patient not taking: Reported on 04/18/2015) 20 tablet 0   No current facility-administered medications on file prior to visit.    BP 136/103 mmHg  Pulse 97  Temp(Src) 98.3 F (36.8 C) (Oral)  Resp 16  Ht 6' (1.829 m)  Wt 212 lb 6.4 oz (96.344 kg)  BMI 28.80 kg/m2  SpO2 99%        Objective:   Physical Exam  Constitutional: He is oriented to person, place, and time. He appears well-developed and well-nourished. No distress.  HENT:  Head: Normocephalic and atraumatic.  Cardiovascular: Normal rate and regular rhythm.   No murmur heard. Pulmonary/Chest: Effort normal and breath sounds normal. No respiratory distress. He has no wheezes. He has no rales.  Abdominal: Soft. There is tenderness. There is no guarding.  Musculoskeletal: He exhibits no edema.  Neurological: He is alert and oriented to person, place, and time.  Skin: Skin is warm and dry.  Psychiatric: He has a normal mood and affect. His behavior is normal. Thought content normal.          Assessment & Plan:

## 2015-04-18 NOTE — Assessment & Plan Note (Addendum)
Patient did screen positive today on Mood disorder Questionnaire.  I have advised pt to contact psychiatry for consult as well as establish with a counselor for help with depression and ETOH abuse.  I am concerned that his insomnia may be related to BPD and he has been self medicating with alcohol.

## 2015-05-24 MED FILL — VALACYCLOVIR HCL 500 MG TAB: 500 | 30 days supply | Qty: 30 | Fill #4

## 2015-05-24 MED FILL — CITALOPRAM HBR 40 MG TABLET: 40 | 30 days supply | Qty: 30 | Fill #0

## 2015-05-24 MED FILL — TOUJEO SOLOSTAR 300 UNITS/M: 300 | 90 days supply | Qty: 5 | Fill #1

## 2015-06-20 MED FILL — CITALOPRAM HBR 40 MG TABLET: 40 | 30 days supply | Qty: 30 | Fill #1

## 2015-06-20 MED FILL — VALACYCLOVIR HCL 500 MG TAB: 500 | 30 days supply | Qty: 30 | Fill #5

## 2015-06-20 MED FILL — KLOR-CON M10 TABLET: 10 | 15 days supply | Qty: 60 | Fill #2

## 2015-07-10 ENCOUNTER — Other Ambulatory Visit: Payer: Self-pay | Admitting: Family

## 2015-07-10 ENCOUNTER — Ambulatory Visit (INDEPENDENT_AMBULATORY_CARE_PROVIDER_SITE_OTHER): Payer: 59 | Admitting: Physician Assistant

## 2015-07-10 ENCOUNTER — Encounter: Payer: Self-pay | Admitting: Physician Assistant

## 2015-07-10 VITALS — BP 130/88 | HR 101 | Temp 97.6°F | Ht 72.0 in | Wt 209.0 lb

## 2015-07-10 DIAGNOSIS — K047 Periapical abscess without sinus: Secondary | ICD-10-CM | POA: Insufficient documentation

## 2015-07-10 MED ORDER — HYDROCODONE-ACETAMINOPHEN 10-325 MG PO TABS: 1.0000 | ORAL_TABLET | Freq: Three times a day (TID) | ORAL | Status: DC | PRN

## 2015-07-10 MED ORDER — AMOXICILLIN-POT CLAVULANATE 875-125 MG PO TABS: 1.0000 | ORAL_TABLET | Freq: Two times a day (BID) | ORAL | Status: DC

## 2015-07-10 MED FILL — AMOX-CLAV 875-125 MG TABLET: 875-125 | 7 days supply | Qty: 14 | Fill #0

## 2015-07-10 MED FILL — HYDROCODON-APAP 10-325: 10-325 | 10 days supply | Qty: 30 | Fill #0

## 2015-07-10 NOTE — Assessment & Plan Note (Signed)
Rx Augmentin. Pain medication given. Supportive measures reviewed. Patient to schedule appointment with dentist ASAP. Alarm symptoms discussed. ER if any of these things happen.

## 2015-07-10 NOTE — Progress Notes (Signed)
Pre visit review using our clinic review tool, if applicable. No additional management support is needed unless otherwise documented below in the visit note. 

## 2015-07-10 NOTE — Progress Notes (Signed)
Patient presents to clinic today c/o pain in tooth of R upper mouth over the past couple of weeks after the tooth broke. Has noted pain off and on over the weekend with swelling of gums and face. Denies fever or chills. Has been rinsing out mouth and taking tylenol for pain.   Past Medical History  Diagnosis Date  . Alcoholic pancreatitis     recurrent  . Diabetes mellitus, type II ([place])     New Onset 03/2010  . Hypertension   . 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 High cholesterol   . Sleep apnea   . GERD (gastroesophageal reflux disease)   . Depression     Current Outpatient Prescriptions on File Prior to Visit  Medication Sig Dispense Refill  . amLODipine (NORVASC) 10 MG tablet Take 1 tablet (10 mg total) by mouth daily. 30 tablet 2  . aspirin EC 81 MG tablet Take 1 tablet (81 mg total) by mouth daily.    Marland Kitchen atorvastatin (LIPITOR) 20 MG tablet TAKE 1 TABLET (20 MG TOTAL) BY MOUTH DAILY. 30 tablet 2  . BD PEN NEEDLE NANO U/F 32G X 4 MM MISC USE ONCE A DAY TO INJECT INSULIN 100 each 0  . citalopram (CELEXA) 40 MG tablet Take 1 tablet (40 mg total) by mouth daily. 30 tablet 2  . lisinopril (PRINIVIL,ZESTRIL) 20 MG tablet Take 1 tablet (20 mg total) by mouth daily. 30 tablet 5  . Multiple Vitamin (MULTIVITAMIN WITH MINERALS) TABS tablet Take 2 tablets by mouth daily.    . pantoprazole (PROTONIX) 40 MG tablet TAKE 1 TABLET (40 MG TOTAL) BY MOUTH DAILY. 30 tablet 5  . potassium chloride (K-DUR,KLOR-CON) 10 MEQ tablet Take 2 tablets (20 mEq total) by mouth 2 (two) times daily. 60 tablet 3  . tadalafil (CIALIS) 10 MG tablet Take 1 tablet (10 mg total) by mouth daily as needed for erectile dysfunction. (Patient not taking: Reported on 07/10/2015) 10 tablet 5  . TOUJEO SOLOSTAR 300 UNIT/ML SOPN INJECT 10 UNITS SUBCUTANEOUSLY ONCE A DAY. (Patient taking differently: INJECT 13 UNITS SUBCUTANEOUSLY ONCE A DAY.) 4.5 mL 2  . valACYclovir  (VALTREX) 500 MG tablet Take 1 tablet (500 mg total) by mouth daily. 30 tablet 5   No current facility-administered medications on file prior to visit.    Allergies  Allergen Reactions  . Invokamet [Canagliflozin-Metformin Hcl] Other (See Comments)    Lactic acidosis  . Metformin And Related     Drastic drop in blood sugar    Family History  Problem Relation Age of Onset  . Diabetes Mother   . Lung cancer Brother     twin brother  . Colon cancer Neg Hx   . Pancreatic cancer Paternal Aunt     Social History   Social History  . Marital Status: Legally Separated    Spouse Name: N/A  . Number of Children: 2  . 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 Smoking status: Never Smoker   . Smokeless tobacco: Never Used  . Alcohol Use: Yes     Comment: last drink 3 days ago  . Drug Use: No  . Sexual Activity: Not Asked   Other Topics Concern  . 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  All other ROS are negative.  BP 130/88 mmHg  Pulse 101  Temp(Src) 97.6 F (36.4 C) (Oral)  Ht 6' (1.829 m)  Wt 209 lb (94.802 kg)  BMI 28.34 kg/m2  SpO2 99%  Physical Exam  Constitutional: He is oriented to person, place, and time and well-developed, well-nourished, and in no distress.  HENT:  Head: Normocephalic and atraumatic.  Mouth/Throat:    Cardiovascular: Normal rate, regular rhythm, normal heart sounds and intact distal pulses.   Pulmonary/Chest: Effort normal and breath sounds normal. No respiratory distress. He has no wheezes. He has no rales. He exhibits no tenderness.  Neurological: He is alert and oriented to person, place, and time.  Skin: Skin is warm and dry. No rash  noted.  Psychiatric: Affect normal.  Vitals reviewed.   Recent Results (from the past 2160 hour(s))  Lipase, blood     Status: Abnormal   Collection Time: 04/16/15  4:07 PM  Result Value Ref Range   Lipase 183 (H) 11 - 51 U/L  Comprehensive metabolic panel     Status: Abnormal   Collection Time: 04/16/15  4:07 PM  Result Value Ref Range   Sodium 130 (L) 135 - 145 mmol/L   Potassium 3.9 3.5 - 5.1 mmol/L   Chloride 96 (L) 101 - 111 mmol/L   CO2 25 22 - 32 mmol/L   Glucose, Bld 342 (H) 65 - 99 mg/dL   BUN 11 6 - 20 mg/dL   Creatinine, Ser 0.93 0.61 - 1.24 mg/dL   Calcium 9.0 8.9 - 10.3 mg/dL   Total Protein 7.6 6.5 - 8.1 g/dL   Albumin 4.4 3.5 - 5.0 g/dL   AST 39 15 - 41 U/L   ALT 41 17 - 63 U/L   Alkaline Phosphatase 86 38 - 126 U/L   Total Bilirubin 2.0 (H) 0.3 - 1.2 mg/dL   GFR calc non Af Amer >60 >60 mL/min   GFR calc Af Amer >60 >60 mL/min    Comment: (NOTE) The eGFR has been calculated using the CKD EPI equation. This calculation has not been validated in all clinical situations. eGFR's persistently <60 mL/min signify possible Chronic Kidney Disease.    Anion gap 9 5 - 15  CBC     Status: None   Collection Time: 04/16/15  4:07 PM  Result Value Ref Range   WBC 8.7 4.0 - 10.5 K/uL   RBC 5.25 4.22 - 5.81 MIL/uL   Hemoglobin 15.3 13.0 - 17.0 g/dL   HCT 42.8 39.0 - 52.0 %   MCV 81.5 78.0 - 100.0 fL   MCH 29.1 26.0 - 34.0 pg   MCHC 35.7 30.0 - 36.0 g/dL   RDW 12.3 11.5 - 15.5 %   Platelets 303 150 - 400 K/uL  Urinalysis, Routine w reflex microscopic (not at [facility])     Status: Abnormal   Collection Time: 04/16/15  4:12 PM  Result Value Ref Range   Color, Urine YELLOW YELLOW   APPearance CLEAR CLEAR   Specific Gravity, Urine >1.046 (H) 1.005 - 1.030   pH 5.5 5.0 - 8.0   Glucose, UA >1000 (A) NEGATIVE mg/dL   Hgb urine dipstick NEGATIVE NEGATIVE   Bilirubin Urine NEGATIVE NEGATIVE   Ketones, ur >  80 (A) NEGATIVE mg/dL   Protein, ur NEGATIVE NEGATIVE mg/dL    Nitrite NEGATIVE NEGATIVE   Leukocytes, UA NEGATIVE NEGATIVE  Urine microscopic-add on     Status: Abnormal   Collection Time: 04/16/15  4:12 PM  Result Value Ref Range   Squamous Epithelial / LPF NONE SEEN NONE SEEN   WBC, UA 0-5 0 - 5 WBC/hpf   RBC / HPF 0-5 0 - 5 RBC/hpf   Bacteria, UA RARE (A) NONE SEEN  Comp Met (CMET)     Status: Abnormal   Collection Time: 04/18/15  7:41 AM  Result Value Ref Range   Sodium 132 (L) 135 - 145 mEq/L   Potassium 3.7 3.5 - 5.1 mEq/L   Chloride 95 (L) 96 - 112 mEq/L   CO2 28 19 - 32 mEq/L   Glucose, Bld 267 (H) 70 - 99 mg/dL   BUN 7 6 - 23 mg/dL   Creatinine, Ser 0.80 0.40 - 1.50 mg/dL   Total Bilirubin 0.7 0.2 - 1.2 mg/dL   Alkaline Phosphatase 92 39 - 117 U/L   AST 22 0 - 37 U/L   ALT 30 0 - 53 U/L   Total Protein 7.9 6.0 - 8.3 g/dL   Albumin 4.4 3.5 - 5.2 g/dL   Calcium 9.6 8.4 - 10.5 mg/dL   GFR 138.76 >60.00 mL/min  Lipase     Status: None   Collection Time: 04/18/15  7:41 AM  Result Value Ref Range   Lipase 57.0 11.0 - 59.0 U/L  CBC w/Diff     Status: None   Collection Time: 04/18/15  7:41 AM  Result Value Ref Range   WBC 8.3 4.0 - 10.5 K/uL   RBC 5.10 4.22 - 5.81 Mil/uL   Hemoglobin 15.0 13.0 - 17.0 g/dL   HCT 44.1 39.0 - 52.0 %   MCV 86.5 78.0 - 100.0 fl   MCHC 34.1 30.0 - 36.0 g/dL   RDW 13.0 11.5 - 15.5 %   Platelets 298.0 150.0 - 400.0 K/uL   Neutrophils Relative % 69.6 43.0 - 77.0 %   Lymphocytes Relative 21.6 12.0 - 46.0 %   Monocytes Relative 6.1 3.0 - 12.0 %   Eosinophils Relative 2.2 0.0 - 5.0 %   Basophils Relative 0.5 0.0 - 3.0 %   Neutro Abs 5.8 1.4 - 7.7 K/uL   Lymphs Abs 1.8 0.7 - 4.0 K/uL   Monocytes Absolute 0.5 0.1 - 1.0 K/uL   Eosinophils Absolute 0.2 0.0 - 0.7 K/uL   Basophils Absolute 0.0 0.0 - 0.1 K/uL    Assessment/Plan: Dental infection Rx Augmentin. Pain medication given. Supportive measures reviewed. Patient to schedule appointment with dentist ASAP. Alarm symptoms discussed. ER if any of these  things happen.

## 2015-07-10 NOTE — Patient Instructions (Signed)
Please take the antibiotic as directed with food. Use Hydrocodone as directed for severe pain. Once improving, switch to tylenol as needed (no more than as directed).  Please schedule an appointment with your dentist ASAP for assessment and treatment.   If anything acutely worsens or you spike a fever, return immediately or go to the ER.

## 2015-07-11 MED FILL — ULTICARE PEN NDL 8MM 31G: 31G X 8 MM | 90 days supply | Qty: 100 | Fill #0

## 2015-07-28 ENCOUNTER — Other Ambulatory Visit: Payer: Self-pay | Admitting: Family

## 2015-07-28 MED FILL — VALACYCLOVIR HCL 500 MG TAB: 500 | 30 days supply | Qty: 30 | Fill #0

## 2015-07-28 MED FILL — KLOR-CON M10 TABLET: 10 | 15 days supply | Qty: 60 | Fill #3

## 2015-07-28 MED FILL — PANTOPRAZOLE SOD DR 40 MG T: 40 | 90 days supply | Qty: 90 | Fill #1

## 2015-07-28 MED FILL — AMLODIPINE BESYLATE 10 MG T: 10 | 90 days supply | Qty: 90 | Fill #0

## 2015-07-28 MED FILL — CITALOPRAM HBR 40 MG TABLET: 40 | 30 days supply | Qty: 30 | Fill #2

## 2015-07-28 NOTE — Telephone Encounter (Signed)
Amlodipine and valacyclovir refills sent to pharmacy. Pt is due for routine follow up with [name].  Please call pt to arrange appt.  Thanks!

## 2015-07-31 NOTE — Telephone Encounter (Signed)
lvm for pt to return call to schedule F/U.  °

## 2015-08-28 ENCOUNTER — Other Ambulatory Visit: Payer: Self-pay | Admitting: Family

## 2015-08-28 MED FILL — KLOR-CON M10 TABLET: 10 | 30 days supply | Qty: 120 | Fill #0

## 2015-08-28 MED FILL — CITALOPRAM HBR 40 MG TABLET: 40 | 30 days supply | Qty: 30 | Fill #0

## 2015-08-28 MED FILL — LISINOPRIL 20 MG TABLET: 20 | 90 days supply | Qty: 90 | Fill #1

## 2015-08-28 MED FILL — ATORVASTATIN 20 MG TABLET: 20 | 30 days supply | Qty: 30 | Fill #0

## 2015-08-28 MED FILL — VALACYCLOVIR HCL 500 MG TAB: 500 | 30 days supply | Qty: 30 | Fill #1

## 2015-08-28 NOTE — Telephone Encounter (Signed)
30 day supply of atorvastatin, citalopram and potassium sent to pharmacy. PT is past due for follow up with PCP and needs office visit before further refills are given. Please call pt to arrange f/u with [name] soon. Thanks!

## 2015-08-30 NOTE — Telephone Encounter (Signed)
LVM for pt informing of medication refill and also advised pt to schedule a FU .

## 2015-09-13 ENCOUNTER — Ambulatory Visit (INDEPENDENT_AMBULATORY_CARE_PROVIDER_SITE_OTHER): Payer: 59 | Admitting: Medical

## 2015-09-13 ENCOUNTER — Encounter: Payer: Self-pay | Admitting: Medical

## 2015-09-13 ENCOUNTER — Ambulatory Visit (HOSPITAL_BASED_OUTPATIENT_CLINIC_OR_DEPARTMENT_OTHER)
Admission: RE | Admit: 2015-09-13 | Discharge: 2015-09-13 | Disposition: A | Discharge status: Home / Self Care | Payer: 59 | Source: Ambulatory Visit | Attending: Medical | Admitting: Medical

## 2015-09-13 VITALS — BP 116/80 | HR 96 | Temp 98.4°F | Ht 72.0 in | Wt 211.6 lb

## 2015-09-13 DIAGNOSIS — M19012 Primary osteoarthritis, left shoulder: Secondary | ICD-10-CM | POA: Diagnosis not present

## 2015-09-13 DIAGNOSIS — S46812A Strain of other muscles, fascia and tendons at shoulder and upper arm level, left arm, initial encounter: Secondary | ICD-10-CM | POA: Diagnosis not present

## 2015-09-13 DIAGNOSIS — M898X1 Other specified disorders of bone, shoulder: Secondary | ICD-10-CM | POA: Diagnosis not present

## 2015-09-13 DIAGNOSIS — E119 Type 2 diabetes mellitus without complications: Secondary | ICD-10-CM | POA: Diagnosis not present

## 2015-09-13 MED ORDER — DICLOFENAC SODIUM 75 MG PO TBEC: 75.0000 mg | DELAYED_RELEASE_TABLET | Freq: Two times a day (BID) | ORAL | Status: DC

## 2015-09-13 MED ORDER — TIZANIDINE HCL 2 MG PO TABS: 2.0000 mg | ORAL_TABLET | Freq: Every day | ORAL | Status: DC

## 2015-09-13 MED FILL — DICLOFENAC SOD EC 75 MG TAB: 75 | 15 days supply | Qty: 30 | Fill #0

## 2015-09-13 MED FILL — tiZANidine HCL 2 MG TABS: 2 | 10 days supply | Qty: 10 | Fill #0

## 2015-09-13 NOTE — Progress Notes (Signed)
Pre visit review using our clinic review tool, if applicable. No additional management support is needed unless otherwise documented below in the visit note. 

## 2015-09-13 NOTE — Addendum Note (Signed)
Addended by: Anabel Halon on: 09/13/2015 05:04 PM   Modules accepted: Orders

## 2015-09-13 NOTE — Patient Instructions (Addendum)
You do appear to have pain in area of trapezius muscle. Will give you diclofenac medicine for pain.  Also will rx zanaflex muscle relaxant to use at night.  Will go ahead and get xray of the scapula of the region based on your concern.  Will get cmp and a1-c today today.  Follow up 7-10 days or as needed.

## 2015-09-13 NOTE — Progress Notes (Addendum)
Subjective:    Patient ID: Willie [name], male    DOB: 1975/05/05, 40 y.o.   MRN: [id number]  HPI   Pt in some back pain. He points to the left scapula area. He taken some tylenol for the pain. This is not adequate for the pain. Pt has no rash. No injury or heavy lifting.  Pt does mention briefly that his twin passed away 4 years from cancer(he mentioned this due to his area of pain and therefore brings is brother history up.Marland Kitchen He can't remember what type of cancer. On review of chart brother may have had lung cancer.(Pt is not a smoker)     Review of Systems  Constitutional: Negative for fever, chills and fatigue.  Respiratory: Negative for cough, choking, shortness of breath and wheezing.   Cardiovascular: Negative for chest pain and palpitations.  Genitourinary: Negative for dysuria.  Musculoskeletal:       Pain medial to left scapula..  Neurological: Negative for dizziness.  Hematological: Negative for adenopathy. Does not bruise/bleed easily.  Psychiatric/Behavioral: Negative for behavioral problems and confusion.     Past Medical History  Diagnosis Date  . Alcoholic pancreatitis     recurrent  . Diabetes mellitus, type II ([place])     New Onset 03/2010  . Hypertension   . 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 High cholesterol   . Sleep apnea   . GERD (gastroesophageal reflux disease)   . Depression      Social History   Social History  . Marital Status: Legally Separated    Spouse Name: N/A  . Number of Children: 2  . 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 Smoking status: Never Smoker   . Smokeless tobacco: Never Used  . Alcohol Use: Yes     Comment: last drink 3 days ago  . Drug Use: No  . Sexual Activity: Not on file   Other Topics Concern  . 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 Lumbar microdiscectomy  ~ 2004    Dr Ellene Route    Family History  Problem Relation Age of Onset  . Diabetes Mother   . Lung cancer Brother     twin brother  . Colon cancer Neg Hx   . Pancreatic cancer Paternal Aunt     Allergies  Allergen Reactions  . Invokamet [Canagliflozin-Metformin Hcl] Other (See Comments)    Lactic acidosis  . Metformin And Related     Drastic drop in blood sugar    Current Outpatient Prescriptions on File Prior to Visit  Medication Sig Dispense Refill  . amLODipine (NORVASC) 10 MG tablet TAKE 1 TABLET BY MOUTH DAILY 30 tablet 2  . aspirin EC 81 MG tablet Take 1 tablet (81 mg total) by mouth daily.    Marland Kitchen atorvastatin (LIPITOR) 20 MG tablet TAKE 1 TABLET BY MOUTH DAILY 30 tablet 0  . citalopram (CELEXA) 40 MG tablet TAKE 1 TABLET BY MOUTH DAILY 30 tablet 0  .  HYDROcodone-acetaminophen (NORCO) 10-325 MG tablet Take 1 tablet by mouth every 8 (eight) hours as needed. 30 tablet 0  . KLOR-CON M10 10 MEQ tablet TAKE 2 TABLETS (20 MEQ TOTAL) BY MOUTH 2 (TWO) TIMES DAILY. 120 tablet 0  . lisinopril (PRINIVIL,ZESTRIL) 20 MG tablet Take 1 tablet (20 mg total) by mouth daily. 30 tablet 5  . Multiple Vitamin (MULTIVITAMIN WITH MINERALS) TABS tablet Take 2 tablets by mouth daily.    . pantoprazole (PROTONIX) 40 MG tablet TAKE 1 TABLET (40 MG TOTAL) BY MOUTH DAILY. 30 tablet 5  . tadalafil (CIALIS) 10 MG tablet Take 1 tablet (10 mg total) by mouth daily as needed for erectile dysfunction. 10 tablet 5  . TOUJEO SOLOSTAR 300 UNIT/ML SOPN INJECT 10 UNITS SUBCUTANEOUSLY ONCE A DAY. (Patient taking differently: INJECT 13 UNITS SUBCUTANEOUSLY ONCE A DAY.) 4.5 mL 2  . ULTICARE SHORT PEN NEEDLES 31G X 8 MM MISC USE ONCE A DAY TO INJECT INSULIN 100 each 1  .  valACYclovir (VALTREX) 500 MG tablet TAKE 1 TABLET (500 MG TOTAL) BY MOUTH DAILY. 30 tablet 5   No current facility-administered medications on file prior to visit.    BP 116/80 mmHg  Pulse 96  Temp(Src) 98.4 F (36.9 C) (Oral)  Ht 6' (1.829 m)  Wt 211 lb 9.6 oz (95.981 kg)  BMI 28.69 kg/m2  SpO2 98%       Objective:   Physical Exam   General- No acute distress. Pleasant patient. Neck- Full range of motion, no jvd, left side trapezius pain on palpation. Mostly in area just medial to scapula left side. No mid cspine tender. Lungs- Clear, even and unlabored. Heart- regular rate and rhythm. Neurologic- CNII- XII grossly intact.  Back- medial to left scapula tender to palpation directly.   Skin- no rashes seen. No vesicles. Large mole left lower latisimuss area. About 12 mm wide. Uniform in color. No irregular edges.       Assessment & Plan:  You do appear to have pain in area of trapezius muscle. Will give you diclofenac medicine for pain.  Also will rx  zanaflex muscle relaxant to use at night.  Will go ahead and get xray of the scapula of the region based on your concern.  Will get cmp and a1-c today today.(ordered today since saw in epic note done in some time)  Follow up 7-10 days or as needed.  Note pt does have large mole left lat area. He is not sure what features were before. In fact it is out of his visual field. I asked his wife to give opinion if she thinks new or if change in size. When we call on labs and xray will touch base with him. May refer to dermatologist.   Mackie Pai, PA-C

## 2015-09-14 LAB — COMPREHENSIVE METABOLIC PANEL
ALBUMIN: 4.6 g/dL (ref 3.5–5.2)
ALK PHOS: 105 U/L (ref 39–117)
ALT: 50 U/L (ref 0–53)
AST: 43 U/L — AB (ref 0–37)
BUN: 7 mg/dL (ref 6–23)
CALCIUM: 9.5 mg/dL (ref 8.4–10.5)
CHLORIDE: 97 meq/L (ref 96–112)
CO2: 26 mEq/L (ref 19–32)
Creatinine, Ser: 0.94 mg/dL (ref 0.40–1.50)
GFR: 114.95 mL/min (ref >60.00)
Glucose, Bld: 362 mg/dL — ABNORMAL HIGH (ref 70–99)
POTASSIUM: 3.8 meq/L (ref 3.5–5.1)
SODIUM: 132 meq/L — AB (ref 135–145)
Total Bilirubin: 0.6 mg/dL (ref 0.2–1.2)
Total Protein: 7.7 g/dL (ref 6.0–8.3)

## 2015-09-14 LAB — HEMOGLOBIN A1C

## 2015-09-14 NOTE — Progress Notes (Signed)
Quick Note:  Pt has seen results on MyChart and message also sent for patient to call back if any questions. ______ 

## 2015-09-14 NOTE — Telephone Encounter (Signed)
Discussed titration of toujeo with Dr. Charlett Blake and pharmacist.

## 2015-09-27 ENCOUNTER — Other Ambulatory Visit: Payer: Self-pay | Admitting: Family

## 2015-09-29 ENCOUNTER — Other Ambulatory Visit: Payer: Self-pay | Admitting: Emergency Medicine

## 2015-09-29 MED ORDER — CITALOPRAM HYDROBROMIDE 40 MG PO TABS: 40.0000 mg | ORAL_TABLET | Freq: Every day | ORAL | Status: DC

## 2015-09-29 MED ORDER — ATORVASTATIN CALCIUM 20 MG PO TABS: 20.0000 mg | ORAL_TABLET | Freq: Every day | ORAL | Status: DC

## 2015-09-29 MED FILL — ATORVASTATIN 20 MG TABLET: 20 | 30 days supply | Qty: 30 | Fill #0

## 2015-09-29 MED FILL — CITALOPRAM HBR 40 MG TABLET: 40 | 30 days supply | Qty: 30 | Fill #0

## 2015-10-02 MED FILL — VALACYCLOVIR HCL 500 MG TAB: 500 | 30 days supply | Qty: 30 | Fill #2

## 2015-10-22 ENCOUNTER — Other Ambulatory Visit: Payer: Self-pay | Admitting: Family

## 2015-10-23 ENCOUNTER — Telehealth: Payer: Self-pay | Admitting: Family

## 2015-10-23 MED ORDER — ASPIRIN EC 81 MG PO TBEC: 81.0000 mg | DELAYED_RELEASE_TABLET | Freq: Every day | ORAL | Status: DC

## 2015-10-23 MED ORDER — CITALOPRAM HYDROBROMIDE 40 MG PO TABS: 40.0000 mg | ORAL_TABLET | Freq: Every day | ORAL | Status: DC

## 2015-10-23 MED ORDER — ATORVASTATIN CALCIUM 20 MG PO TABS: 20.0000 mg | ORAL_TABLET | Freq: Every day | ORAL | Status: DC

## 2015-10-23 MED FILL — ATORVASTATIN 20 MG TABLET: 20 | 30 days supply | Qty: 30 | Fill #0

## 2015-10-23 MED FILL — ASPIR-LOW EC 81 MG TABLET: 81 | 90 days supply | Qty: 90 | Fill #0

## 2015-10-23 MED FILL — TOUJEO SOLOSTAR 300 UNITS/M: 300 | 68 days supply | Qty: 5 | Fill #2

## 2015-10-23 MED FILL — CITALOPRAM HBR 40 MG TABLET: 40 | 30 days supply | Qty: 30 | Fill #0

## 2015-10-23 NOTE — Telephone Encounter (Signed)
Refill sent in addiitonal rx request from 10/22/15.

## 2015-10-23 NOTE — Telephone Encounter (Signed)
My chart message sent to pt.

## 2015-10-24 NOTE — Telephone Encounter (Signed)
2 week supply of: potassium, lisinopril, amlodipine and pantoprazole sent to pharmacy as pt is past due for follow up with PCP. Please call pt to schedule appt as we cannot give further refills until he is seen. Thanks!

## 2015-10-27 NOTE — Telephone Encounter (Signed)
Left detailed voicemail informing pt of the below.

## 2015-11-02 MED FILL — AMLODIPINE BESYLATE 10 MG T: 10 | 14 days supply | Qty: 14 | Fill #0

## 2015-11-02 MED FILL — POTASSIUM CL 10 MEQ TAB SA: 10 | 14 days supply | Qty: 56 | Fill #0

## 2015-11-02 MED FILL — PANTOPRAZOLE SOD DR 40 MG T: 40 | 14 days supply | Qty: 14 | Fill #0

## 2015-11-07 ENCOUNTER — Ambulatory Visit: Payer: 59 | Admitting: Physician Assistant

## 2015-11-07 ENCOUNTER — Telehealth: Payer: Self-pay | Admitting: Family

## 2015-11-07 NOTE — Telephone Encounter (Signed)
Attempted to contact patient to inform him that his appointment that he scheduled on My Chart was scheduled incorrectly and needed to be rescheduled. Notified patient via VM that appointment with Elyn Aquas had been canceled and he needed to call the office to scheduled an appointment with Debbrah Alar.

## 2015-11-20 ENCOUNTER — Ambulatory Visit (INDEPENDENT_AMBULATORY_CARE_PROVIDER_SITE_OTHER): Payer: 59 | Admitting: Family

## 2015-11-20 ENCOUNTER — Encounter: Payer: Self-pay | Admitting: Family

## 2015-11-20 VITALS — BP 100/70 | HR 99 | Temp 97.8°F | Ht 72.0 in | Wt 217.6 lb

## 2015-11-20 DIAGNOSIS — F411 Generalized anxiety disorder: Secondary | ICD-10-CM | POA: Diagnosis not present

## 2015-11-20 DIAGNOSIS — E785 Hyperlipidemia, unspecified: Secondary | ICD-10-CM

## 2015-11-20 DIAGNOSIS — I10 Essential (primary) hypertension: Secondary | ICD-10-CM | POA: Diagnosis not present

## 2015-11-20 DIAGNOSIS — K219 Gastro-esophageal reflux disease without esophagitis: Secondary | ICD-10-CM

## 2015-11-20 MED ORDER — PANTOPRAZOLE SODIUM 40 MG PO TBEC
40.0000 mg | DELAYED_RELEASE_TABLET | Freq: Every day | ORAL | 1 refills | Status: DC
Start: 2015-11-20 — End: 2016-06-25

## 2015-11-20 MED ORDER — POTASSIUM CHLORIDE CRYS ER 10 MEQ PO TBCR: 20.0000 meq | EXTENDED_RELEASE_TABLET | Freq: Every day | ORAL | 1 refills | Status: DC

## 2015-11-20 MED ORDER — ZOLPIDEM TARTRATE 5 MG PO TABS: 5.0000 mg | ORAL_TABLET | Freq: Every evening | ORAL | 0 refills | Status: DC | PRN

## 2015-11-20 MED ORDER — LISINOPRIL 20 MG PO TABS: ORAL_TABLET | ORAL | 1 refills | Status: DC

## 2015-11-20 MED ORDER — AMLODIPINE BESYLATE 10 MG PO TABS: 10.0000 mg | ORAL_TABLET | Freq: Every day | ORAL | 1 refills | Status: DC

## 2015-11-20 MED ORDER — INSULIN GLARGINE 300 UNIT/ML ~~LOC~~ SOPN: 20.0000 [IU] | PEN_INJECTOR | Freq: Every day | SUBCUTANEOUS | 1 refills | Status: DC

## 2015-11-20 MED ORDER — ATORVASTATIN CALCIUM 20 MG PO TABS: 20.0000 mg | ORAL_TABLET | Freq: Every day | ORAL | 1 refills | Status: DC

## 2015-11-20 MED ORDER — CITALOPRAM HYDROBROMIDE 40 MG PO TABS: 40.0000 mg | ORAL_TABLET | Freq: Every day | ORAL | 1 refills | Status: DC

## 2015-11-20 MED ORDER — VALACYCLOVIR HCL 500 MG PO TABS: ORAL_TABLET | ORAL | 1 refills | Status: DC

## 2015-11-20 MED FILL — ATORVASTATIN 20 MG TABLET: 20 | 90 days supply | Qty: 90 | Fill #0

## 2015-11-20 MED FILL — CITALOPRAM HBR 40 MG TABLET: 40 | 90 days supply | Qty: 90 | Fill #0

## 2015-11-20 MED FILL — VALACYCLOVIR HCL 500 MG TAB: 500 | 90 days supply | Qty: 90 | Fill #0

## 2015-11-20 MED FILL — POTASSIUM CL 10 MEQ TAB SA: 10 | 90 days supply | Qty: 180 | Fill #0

## 2015-11-20 MED FILL — AMLODIPINE BESYLATE 10 MG T: 10 | 90 days supply | Qty: 90 | Fill #0

## 2015-11-20 MED FILL — PANTOPRAZOLE SOD DR 40 MG T: 40 | 90 days supply | Qty: 90 | Fill #0

## 2015-11-20 MED FILL — LISINOPRIL 20 MG TABLET: 20 | 90 days supply | Qty: 90 | Fill #0

## 2015-11-20 MED FILL — ZOLPIDEM TARTRATE 5 MG TAB: 5 | 15 days supply | Qty: 15 | Fill #0

## 2015-11-20 NOTE — Assessment & Plan Note (Signed)
On protonix.  Stable.  

## 2015-11-20 NOTE — Progress Notes (Signed)
Pre visit review using our clinic review tool, if applicable. No additional management support is needed unless otherwise documented below in the visit note. 

## 2015-11-20 NOTE — Assessment & Plan Note (Signed)
Advised pt to check his sugar in the AM for the next few days and send me his readings via my chart for additional dosing recommendations for Toujeo.

## 2015-11-20 NOTE — Assessment & Plan Note (Signed)
Continue lipitor, obtain lipid panel.

## 2015-11-20 NOTE — Assessment & Plan Note (Signed)
Secondary to recent loss of brother. We discussed meeting with a therapist for grief counseling and help with anxiety, continuing citalopram and adding in ambien HS prn for short term.

## 2015-11-20 NOTE — Patient Instructions (Addendum)
Complete lab work prior to leaving.  For anxiety: Please contact [facility] to schedule an appointment with a therapist. Also, continue citalopram and try to add some regular exercise.  For insomnia- please begin ambien 1 tab before bedtime as needed.  Diabetes- check your sugar once daily in the AM for the next 3 days, then send me your readings via mychart. Continue Toujeo.

## 2015-11-20 NOTE — Progress Notes (Signed)
Subjective:    Patient ID: Willie [name], male    DOB: [birth year], 40 y.o.   MRN: [id number]  HPI  Willie [name] is a 40 yr old male who presents today for follow up.  1) Anxiety- Brother had a heart attack and died 1 week ago.  Reports that he drank the day after he learned.  Reports that he has not slept much in the last 1 week.  Has tried melatonin 6mg  without improvement.  Work is going well (at [employer] in [place]).  Getting along well with wife/family.  He continues citalopram.    2) DM2- he reports that he increase to 20 units/day and has not been checking sugars "at all."  Admits to recent dietary non-compliance.  Lab Results  Component Value Date   HGBA1C 12.6 Repeated and verified X2. (H) 09/13/2015   HGBA1C 10.7 (H) 03/21/2015   HGBA1C 10.0 (H) 11/09/2014   Lab Results  Component Value Date   MICROALBUR 1.0 05/24/2014   LDLCALC 59 11/23/2014   CREATININE 0.94 09/13/2015   3) GERD- on protonix. Reports that GERD symptoms are well controlled unless he eats "greasy food." Tries to avoid greasy food.   4) Hyperlipidemia- on lipitor 20mg .  Lab Results  Component Value Date   CHOL 125 11/23/2014   HDL 52.20 11/23/2014   LDLCALC 59 11/23/2014   LDLDIRECT 152.0 11/09/2014   TRIG 68.0 11/23/2014   CHOLHDL 2 11/23/2014   Review of Systems See HPI  Past Medical History:  Diagnosis Date  . Alcoholic pancreatitis    recurrent  . Depression   . Diabetes mellitus, type II ([place])    New Onset 03/2010  . GERD (gastroesophageal reflux disease)   . High cholesterol   . 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 Hypertension   . Sleep apnea      Social History   Social History  . Marital status: Legally Separated    Spouse name: N/A  . Number of children: 2  . Years of education: N/A   Occupational History  . 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 Smoking status:  Never Smoker  . Smokeless tobacco: Never Used  . Alcohol use Yes     Comment: last drink 3 days ago  . Drug use: No  . Sexual activity: Not on file   Other Topics Concern  . 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 LUMBAR MICRODISCECTOMY  ~ 2004   Dr Ellene Route    Family History  Problem Relation Age of Onset  . Diabetes Mother   . Lung cancer Brother     twin brother  . Pancreatic cancer Paternal Aunt   . Colon cancer Neg Hx     Allergies  Allergen Reactions  . Invokamet [Canagliflozin-Metformin Hcl] Other (See Comments)    Lactic acidosis  . Metformin And Related     Drastic drop in blood sugar    Current Outpatient Prescriptions on File Prior to Visit  Medication Sig Dispense Refill  . aspirin EC 81 MG  tablet Take 1 tablet (81 mg total) by mouth daily. 90 tablet 1  . Multiple Vitamin (MULTIVITAMIN WITH MINERALS) TABS tablet Take 2 tablets by mouth daily.    . tadalafil (CIALIS) 10 MG tablet Take 1 tablet (10 mg total) by mouth daily as needed for erectile dysfunction. 10 tablet 5  . ULTICARE SHORT PEN NEEDLES 31G X 8 MM MISC USE ONCE A DAY TO INJECT INSULIN 100 each 1   No current facility-administered medications on file prior to visit.     BP 100/70   Pulse 99   Temp 97.8 F (36.6 C) (Oral)   Ht 6' (1.829 m)   Wt 217 lb 9.6 oz (98.7 kg)   SpO2 98% Comment: room air  BMI 29.51 kg/m       Objective:   Physical Exam  Constitutional: He is oriented to person, place, and time. He appears well-developed and well-nourished. No distress.  HENT:  Head: Normocephalic and atraumatic.  Cardiovascular: Normal rate and regular rhythm.   No murmur heard. Pulmonary/Chest: Effort normal and breath sounds normal. No respiratory  distress. He has no wheezes. He has no rales. He exhibits no tenderness.  Neurological: He is alert and oriented to person, place, and time.  Skin: Skin is warm and dry.  Psychiatric: He has a normal mood and affect. His behavior is normal. Judgment and thought content normal.          Assessment & Plan:

## 2015-11-21 ENCOUNTER — Telehealth: Payer: Self-pay | Admitting: Family

## 2015-11-21 LAB — BASIC METABOLIC PANEL
BUN: 12 mg/dL (ref 6–23)
CHLORIDE: 96 meq/L (ref 96–112)
CO2: 23 meq/L (ref 19–32)
CREATININE: 1.08 mg/dL (ref 0.40–1.50)
Calcium: 9.6 mg/dL (ref 8.4–10.5)
GFR: 97.84 mL/min (ref >60.00)
Glucose, Bld: 494 mg/dL — ABNORMAL HIGH (ref 70–99)
POTASSIUM: 4.3 meq/L (ref 3.5–5.1)
Sodium: 130 mEq/L — ABNORMAL LOW (ref 135–145)

## 2015-11-21 LAB — LIPID PANEL
CHOL/HDL RATIO: 3
CHOLESTEROL: 147 mg/dL (ref 0–200)
HDL: 46.2 mg/dL (ref >39.00)
NONHDL: 100.51
TRIGLYCERIDES: 251 mg/dL — AB (ref 0.0–149.0)
VLDL: 50.2 mg/dL — AB (ref 0.0–40.0)

## 2015-11-21 LAB — LDL CHOLESTEROL, DIRECT: Direct LDL: 77 mg/dL

## 2015-11-21 NOTE — Telephone Encounter (Signed)
Please advise pt that his sugar is very uncontrolled. Sugar ws 494 yesterday.  Make sure he is taking his insulin as ordered.  I would like him to recheck his sugar today and if Sugar is >400 he should proceed to the ER.

## 2015-11-21 NOTE — Telephone Encounter (Signed)
Pt notified of results and verbalized understanding. He was surprised by how high the reading was. He reports compliance w/ Toujeo. Fasting CBG this morning was 295. He agrees to ED eval if CBG climbs > 400.

## 2015-11-22 NOTE — Telephone Encounter (Signed)
Contacted patient. Left message advising pt to send me his most recent blood sugar via mychart.

## 2015-11-23 ENCOUNTER — Encounter: Payer: Self-pay | Admitting: Family

## 2015-11-23 MED ORDER — INSULIN GLARGINE 300 UNIT/ML ~~LOC~~ SOPN: 30.0000 [IU] | PEN_INJECTOR | Freq: Every day | SUBCUTANEOUS | 1 refills | Status: DC

## 2015-11-30 ENCOUNTER — Ambulatory Visit (INDEPENDENT_AMBULATORY_CARE_PROVIDER_SITE_OTHER): Payer: 59 | Admitting: Licensed Clinical Social Worker

## 2015-11-30 DIAGNOSIS — F4323 Adjustment disorder with mixed anxiety and depressed mood: Secondary | ICD-10-CM

## 2015-12-04 ENCOUNTER — Encounter: Payer: Self-pay | Admitting: Family

## 2015-12-04 NOTE — Addendum Note (Signed)
Addended by: Kelle Darting A on: 12/04/2015 02:38 PM   Modules accepted: Orders

## 2015-12-04 NOTE — Telephone Encounter (Signed)
Ok to send #30 tabs pls 

## 2015-12-04 NOTE — Telephone Encounter (Signed)
Ambien Rx pended. Last refill 11/20/15, #15 tablets. Please advise request?

## 2015-12-05 ENCOUNTER — Encounter: Payer: Self-pay | Admitting: Family

## 2015-12-05 MED ORDER — ZOLPIDEM TARTRATE 5 MG PO TABS: 5.0000 mg | ORAL_TABLET | Freq: Every evening | ORAL | 0 refills | Status: DC | PRN

## 2015-12-05 MED FILL — ZOLPIDEM TARTRATE 5 MG TAB: 5 | 30 days supply | Qty: 30 | Fill #0

## 2015-12-05 NOTE — Telephone Encounter (Signed)
Rx called to pharmacy, message sent to pt.

## 2015-12-05 NOTE — Addendum Note (Signed)
Addended by: Kelle Darting A on: 12/05/2015 05:37 PM   Modules accepted: Orders

## 2015-12-10 ENCOUNTER — Emergency Department (HOSPITAL_COMMUNITY)
Admission: EM | Admit: 2015-12-10 | Discharge: 2015-12-10 | Disposition: A | Discharge status: Home / Self Care | Payer: 59 | Attending: Emergency Medicine | Admitting: Emergency Medicine

## 2015-12-10 ENCOUNTER — Encounter (HOSPITAL_COMMUNITY): Payer: Self-pay | Admitting: Emergency Medicine

## 2015-12-10 DIAGNOSIS — I10 Essential (primary) hypertension: Secondary | ICD-10-CM | POA: Insufficient documentation

## 2015-12-10 DIAGNOSIS — E119 Type 2 diabetes mellitus without complications: Secondary | ICD-10-CM | POA: Diagnosis not present

## 2015-12-10 DIAGNOSIS — Z7984 Long term (current) use of oral hypoglycemic drugs: Secondary | ICD-10-CM | POA: Diagnosis not present

## 2015-12-10 DIAGNOSIS — Z794 Long term (current) use of insulin: Secondary | ICD-10-CM | POA: Diagnosis not present

## 2015-12-10 DIAGNOSIS — Z7982 Long term (current) use of aspirin: Secondary | ICD-10-CM | POA: Diagnosis not present

## 2015-12-10 DIAGNOSIS — F4320 Adjustment disorder, unspecified: Secondary | ICD-10-CM | POA: Insufficient documentation

## 2015-12-10 DIAGNOSIS — R45851 Suicidal ideations: Secondary | ICD-10-CM

## 2015-12-10 DIAGNOSIS — F1019 Alcohol abuse with unspecified alcohol-induced disorder: Secondary | ICD-10-CM | POA: Diagnosis not present

## 2015-12-10 DIAGNOSIS — F4321 Adjustment disorder with depressed mood: Secondary | ICD-10-CM | POA: Diagnosis not present

## 2015-12-10 DIAGNOSIS — Z79899 Other long term (current) drug therapy: Secondary | ICD-10-CM | POA: Insufficient documentation

## 2015-12-10 DIAGNOSIS — Y906 Blood alcohol level of 120-199 mg/100 ml: Secondary | ICD-10-CM | POA: Diagnosis not present

## 2015-12-10 LAB — COMPREHENSIVE METABOLIC PANEL
ALBUMIN: 4.5 g/dL (ref 3.5–5.0)
ALT: 70 U/L — ABNORMAL HIGH (ref 17–63)
ANION GAP: 16 — AB (ref 5–15)
AST: 94 U/L — AB (ref 15–41)
Alkaline Phosphatase: 100 U/L (ref 38–126)
BILIRUBIN TOTAL: 1.2 mg/dL (ref 0.3–1.2)
BUN: 12 mg/dL (ref 6–20)
CHLORIDE: 101 mmol/L (ref 101–111)
CO2: 22 mmol/L (ref 22–32)
Calcium: 8.9 mg/dL (ref 8.9–10.3)
Creatinine, Ser: 0.63 mg/dL (ref 0.61–1.24)
GFR calc Af Amer: >60 mL/min (ref >60)
GFR calc non Af Amer: >60 mL/min (ref >60)
GLUCOSE: 189 mg/dL — AB (ref 65–99)
POTASSIUM: 3.2 mmol/L — AB (ref 3.5–5.1)
SODIUM: 139 mmol/L (ref 135–145)
TOTAL PROTEIN: 8.6 g/dL — AB (ref 6.5–8.1)

## 2015-12-10 LAB — LIPASE, BLOOD: Lipase: 12 U/L (ref 11–51)

## 2015-12-10 LAB — CBC
HEMATOCRIT: 44.8 % (ref 39.0–52.0)
Hemoglobin: 15.9 g/dL (ref 13.0–17.0)
MCH: 30.3 pg (ref 26.0–34.0)
MCHC: 35.5 g/dL (ref 30.0–36.0)
MCV: 85.3 fL (ref 78.0–100.0)
PLATELETS: 436 10*3/uL — AB (ref 150–400)
RBC: 5.25 MIL/uL (ref 4.22–5.81)
RDW: 12.8 % (ref 11.5–15.5)
WBC: 11.9 10*3/uL — AB (ref 4.0–10.5)

## 2015-12-10 LAB — RAPID URINE DRUG SCREEN, HOSP PERFORMED
AMPHETAMINES: NOT DETECTED
BARBITURATES: NOT DETECTED
BENZODIAZEPINES: NOT DETECTED
COCAINE: NOT DETECTED
Opiates: NOT DETECTED
Tetrahydrocannabinol: NOT DETECTED

## 2015-12-10 LAB — ACETAMINOPHEN LEVEL

## 2015-12-10 LAB — ETHANOL: Alcohol, Ethyl (B): 162 mg/dL — ABNORMAL HIGH (ref <5)

## 2015-12-10 LAB — SALICYLATE LEVEL: Salicylate Lvl: <4 mg/dL (ref 2.8–30.0)

## 2015-12-10 LAB — CBG MONITORING, ED: GLUCOSE-CAPILLARY: 130 mg/dL — AB (ref 65–99)

## 2015-12-10 MED ORDER — LORAZEPAM 1 MG PO TABS: 1.0000 mg | ORAL_TABLET | Freq: Four times a day (QID) | ORAL | Status: DC | PRN

## 2015-12-10 MED ORDER — LORAZEPAM 1 MG PO TABS
0.0000 mg | ORAL_TABLET | Freq: Four times a day (QID) | ORAL | Status: DC
  Administered 2015-12-10: 1 mg via ORAL
  Filled 2015-12-10: qty 1

## 2015-12-10 MED ORDER — INSULIN GLARGINE 100 UNIT/ML ~~LOC~~ SOLN
30.0000 [IU] | Freq: Every day | SUBCUTANEOUS | Status: DC
  Administered 2015-12-10: 30 [IU] via SUBCUTANEOUS
  Filled 2015-12-10: qty 0.3

## 2015-12-10 MED ORDER — ADULT MULTIVITAMIN W/MINERALS CH
1.0000 | ORAL_TABLET | Freq: Every day | ORAL | Status: DC
  Administered 2015-12-10: 1 via ORAL
  Filled 2015-12-10: qty 1

## 2015-12-10 MED ORDER — LORAZEPAM 2 MG/ML IJ SOLN: 1.0000 mg | Freq: Four times a day (QID) | INTRAMUSCULAR | Status: DC | PRN

## 2015-12-10 MED ORDER — LISINOPRIL 20 MG PO TABS
20.0000 mg | ORAL_TABLET | Freq: Every day | ORAL | Status: DC
  Administered 2015-12-10: 20 mg via ORAL
  Filled 2015-12-10: qty 1

## 2015-12-10 MED ORDER — ATORVASTATIN CALCIUM 20 MG PO TABS
20.0000 mg | ORAL_TABLET | Freq: Every day | ORAL | Status: DC
  Administered 2015-12-10: 20 mg via ORAL
  Filled 2015-12-10: qty 1

## 2015-12-10 MED ORDER — AMLODIPINE BESYLATE 10 MG PO TABS
10.0000 mg | ORAL_TABLET | Freq: Every day | ORAL | Status: DC
  Administered 2015-12-10: 10 mg via ORAL
  Filled 2015-12-10: qty 1

## 2015-12-10 MED ORDER — ASPIRIN EC 81 MG PO TBEC
81.0000 mg | DELAYED_RELEASE_TABLET | Freq: Every day | ORAL | Status: DC
  Administered 2015-12-10: 81 mg via ORAL
  Filled 2015-12-10: qty 1

## 2015-12-10 MED ORDER — FOLIC ACID 1 MG PO TABS
1.0000 mg | ORAL_TABLET | Freq: Every day | ORAL | Status: DC
  Administered 2015-12-10: 1 mg via ORAL
  Filled 2015-12-10: qty 1

## 2015-12-10 MED ORDER — LORAZEPAM 1 MG PO TABS: 0.0000 mg | ORAL_TABLET | Freq: Two times a day (BID) | ORAL | Status: DC

## 2015-12-10 MED ORDER — IBUPROFEN 200 MG PO TABS: 600.0000 mg | ORAL_TABLET | Freq: Three times a day (TID) | ORAL | Status: DC | PRN

## 2015-12-10 MED ORDER — THIAMINE HCL 100 MG/ML IJ SOLN: 100.0000 mg | Freq: Every day | INTRAMUSCULAR | Status: DC

## 2015-12-10 MED ORDER — ONDANSETRON HCL 4 MG PO TABS
4.0000 mg | ORAL_TABLET | Freq: Three times a day (TID) | ORAL | Status: DC | PRN
  Administered 2015-12-10: 4 mg via ORAL
  Filled 2015-12-10: qty 1

## 2015-12-10 MED ORDER — VITAMIN B-1 100 MG PO TABS
100.0000 mg | ORAL_TABLET | Freq: Every day | ORAL | Status: DC
  Administered 2015-12-10: 100 mg via ORAL
  Filled 2015-12-10: qty 1

## 2015-12-10 MED ORDER — ACETAMINOPHEN 325 MG PO TABS: 650.0000 mg | ORAL_TABLET | ORAL | Status: DC | PRN

## 2015-12-10 MED ORDER — POTASSIUM CHLORIDE CRYS ER 20 MEQ PO TBCR
20.0000 meq | EXTENDED_RELEASE_TABLET | Freq: Every day | ORAL | Status: DC
  Administered 2015-12-10: 20 meq via ORAL
  Filled 2015-12-10: qty 1

## 2015-12-10 MED ORDER — ZOLPIDEM TARTRATE 5 MG PO TABS: 5.0000 mg | ORAL_TABLET | Freq: Every evening | ORAL | Status: DC | PRN

## 2015-12-10 MED ORDER — CITALOPRAM HYDROBROMIDE 10 MG PO TABS
10.0000 mg | ORAL_TABLET | Freq: Every day | ORAL | Status: DC
  Administered 2015-12-10: 10 mg via ORAL
  Filled 2015-12-10: qty 1

## 2015-12-10 MED ORDER — PANTOPRAZOLE SODIUM 40 MG PO TBEC
40.0000 mg | DELAYED_RELEASE_TABLET | Freq: Every day | ORAL | Status: DC
  Administered 2015-12-10: 40 mg via ORAL
  Filled 2015-12-10: qty 1

## 2015-12-10 NOTE — ED Notes (Signed)
Patient states he is feeling better since zofran administered.  Lipase level back wnl.  Patient denies any thoughts of self harm.  He denies HI/AVH.  Patient is refusing any outside resources and would like to be discharged home.  He is voluntary.

## 2015-12-10 NOTE — BH Assessment (Signed)
Consulted with Waylan Boga, DNP regarding patient refusing services. Patient states that he will follow up with his therapist. Provided patient with information to contact mobile crisis. Informed patient of [facility] resources in his discharge instructions. Patient denies questions or concerns at this time.   Rosalin Hawking, LCSW Therapeutic Triage Specialist [place] 12/10/2015 4:40 PM

## 2015-12-10 NOTE — BH Assessment (Signed)
Willie Boga, DNP recommends treatment in the observation unit. Spoke with patient about treatment options and patient declined. Patient declines treatment in the observation unit and states "I'm not suicidal." Patient declines inpatient treatment and states "I don't want to be locked up." Patient declined IOP and states "I can't because I have to work, being here today is already stressful." Patient states that he will call his therapist in the morning and move up his therapy appointment.  Patient denies SI/HI and AVH.   Rosalin Hawking, LCSW Therapeutic Triage Specialist [place] 12/10/2015 2:40 PM

## 2015-12-10 NOTE — BH Assessment (Addendum)
Assessment Note  Willie [name] is an 40 y.o. male presenting to [facility] voluntarily for increasing depression. Patient states that he had thoughts about suicide last night after drinking with no plan. Patient states "it was just bad thoughts" and declined to elaborate further. Patient denies current SI with no intent or plan. Patient denies history of attempts and self injurious behaviors. Patient denies HI and history of aggressive behavior. Patient denies access to firearms. Patient states that he has an upcoming court date this week for a DUI but is not sure of the exact date. Patient states that he is currently on probation for DUI. Patient denies AVH and does not appear to be responding to internal stimuli. Patient states that he "binge" drinks approximately every three weeks. Patient states that he does not know how long he drinks or how much. Patient denies use of drugs. Patient states that he last drank yesterday and his BAL 162 upon arrival. Patient UDS clear.   Patient is alert and oriented x4. Patient is calm and cooperative during assessment. Patient states that his most recent stressor is his brother passing away unexpectedly two weeks ago. Patient states that h started therapy at that time. Patient endorses symptoms of depression as; insomnia, anhedonia, crying spells, loss of appetite, irritability, feelings of worthlessness and hopelessness, low energy, and decreased grooming. Patient states that he is prescribed "a list" of medications by his PCP but cannot recall the names. Patient states that he took the medication as prescribed until about two days ago. Patient denies seeing a psychiatrist for his medication. Patient denies previous inpatient psychiatric treatment but states that he has been admitted to a detox program "years ago." Patient states that his sister and mother are supportive. Patient states that he lives with his wife and two children.    Consulted with Waylan Boga, DNP who  recommends treatment in the observation unit at this time. Patient offered treatment and refused.   Diagnosis: Alcohol use disorder, Mild, Uncomplicated Bereavement   Past Medical History:  Past Medical History:  Diagnosis Date  . Alcoholic pancreatitis    recurrent  . Depression   . Diabetes mellitus, type II ([place])    New Onset 03/2010  . GERD (gastroesophageal reflux disease)   . High cholesterol   . 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 Hypertension   . Sleep apnea     Past Surgical History:  Procedure Laterality Date  . LUMBAR MICRODISCECTOMY  ~ 2004   Dr Ellene Route    Family History:  Family History  Problem Relation Age of Onset  . Diabetes Mother   . Lung cancer Brother     twin brother  . Pancreatic cancer Paternal Aunt   . Colon cancer Neg Hx     Social History:  reports that he has never smoked. He has never used smokeless tobacco. He reports that he drinks alcohol. He reports that he does not use drugs.  Additional Social History:  Alcohol / Drug Use Pain Medications: Denies Prescriptions: Denies Over the Counter: Denies History of alcohol / drug use?: Yes Negative Consequences of Use: Legal Substance #1 Name of Substance 1: Alcohol 1 - Age of First Use: 17 1 - Amount (size/oz): varies 1 - Frequency: "I binge"  1 - Duration: "about every three weeks" 1 - Last Use / Amount: yesterday  CIWA: CIWA-Ar BP: (!) 135/104 Pulse Rate: 114 COWS:    Allergies:  Allergies  Allergen Reactions  . Invokamet [Canagliflozin-Metformin Hcl] Other (See Comments)    Lactic acidosis  . Metformin And Related     Drastic drop in blood sugar    Home Medications:  (Not in a hospital admission)  OB/GYN Status:  No LMP for male patient.  General Assessment Data [facility] T[facility] Assessment: In system Is this a Tele or Face-to-Face Assessment?: Face-to-Face Is this an Initial Assessment or a  Re-assessment for this encounter?: Initial Assessment Marital status: Married Is patient pregnant?: No Pregnancy Status: No Living Arrangements: Spouse/significant other, Children Can pt return to current living arrangement?: Yes Admission Status: Voluntary Is patient capable of signing voluntary admission?: Yes Referral Source: Self/Family/Friend     Crisis Care Plan Living Arrangements: Spouse/significant other, Children Name of Psychiatrist: None Name of Therapist: Almyra Free  Psychologist, clinical)  Education Status Is patient currently in school?: Yes Current Grade: 2nd year Name of school: [employer] to self with the past 6 months Suicidal Ideation: No Has patient been a risk to self within the past 6 months prior to admission? : Yes Suicidal Intent: No Has patient had any suicidal intent within the past 6 months prior to admission? : No Is patient at risk for suicide?: No Suicidal Plan?: No Has patient had any suicidal plan within the past 6 months prior to admission? : No Access to Means: No What has been your use of drugs/alcohol within the last 12 months?: Alcohol Binges Previous Attempts/Gestures: No How many times?: 0 Other Self Harm Risks: Denies Triggers for Past Attempts: None known Intentional Self Injurious Behavior: None Family Suicide History: No Recent stressful life event(s): Loss (Comment), Legal Issues (brother passed away, DUI) Persecutory voices/beliefs?: No Depression: Yes Depression Symptoms: Despondent, Insomnia, Tearfulness, Isolating, Fatigue, Guilt, Loss of interest in usual pleasures, Feeling worthless/self pity, Feeling angry/irritable Substance abuse history and/or treatment for substance abuse?: No Suicide prevention information given to non-admitted patients: Not applicable  Risk to Others within the past 6 months Homicidal Ideation: No Does patient have any lifetime risk of violence toward others beyond the six months prior to admission?  : No Thoughts of Harm to Others: No Current Homicidal Intent: No Current Homicidal Plan: No Access to Homicidal Means: No Identified Victim: Denies History of harm to others?: No Assessment of Violence: None Noted Violent Behavior Description: Denies Does patient have access to weapons?: No Criminal Charges Pending?: Yes Describe Pending Criminal Charges: DUI Does patient have a court date: Yes Court Date:  (This week, date [facility]) Is patient on probation?: Yes (DUI)  Psychosis Hallucinations: None noted Delusions: None noted  Mental Status Report Appearance/Hygiene: In scrubs Eye Contact: Fair Motor Activity: Unable to assess Speech: Logical/coherent Level of Consciousness: Alert Mood: Depressed Affect: Depressed Anxiety Level: None Thought Processes: Coherent, Relevant Judgement: Partial Orientation: Person, Place, Time, Situation, Appropriate for developmental age Obsessive Compulsive Thoughts/Behaviors: None  Cognitive Functioning Concentration: Decreased Memory: Recent Intact, Remote Intact IQ: Average Insight: Fair Impulse Control: Fair Appetite: Poor Sleep: Decreased Vegetative Symptoms: Decreased grooming  ADLScreening [facility] Assessment Services) Patient's cognitive ability adequate to safely complete daily activities?: Yes Patient able to express need for assistance with ADLs?: Yes Independently performs ADLs?: Yes (appropriate for developmental age)  Prior Inpatient Therapy Prior Inpatient Therapy: No Prior Therapy Dates: N/A Prior Therapy Facilty/Provider(s): N/A Reason for Treatment: N/A  Prior Outpatient Therapy Prior Outpatient Therapy: Yes Prior Therapy Dates: Present Prior Therapy Facilty/Provider(s): [facility] Reason for Treatment: Depression - brother passed away  Does patient have an ACCT team?: No Does patient have Intensive In-House Services?  : No Does patient have [facility] services? : No Does patient have P4CC services?: No  ADL  Screening (condition at time of admission) Patient's cognitive ability adequate to safely complete daily activities?: Yes Is the patient deaf or have difficulty hearing?: No Does the patient have difficulty seeing, even when wearing glasses/contacts?: No Does the patient have difficulty concentrating, remembering, or making decisions?: No Patient able to express need for assistance with ADLs?: Yes Does the patient have difficulty dressing or bathing?: No Independently performs ADLs?: Yes (appropriate for developmental age) Does the patient have difficulty walking or climbing stairs?: No Weakness of Legs: None Weakness of Arms/Hands: None  Home Assistive Devices/Equipment Home Assistive Devices/Equipment: None  Therapy Consults (therapy consults require a physician order) PT Evaluation Needed: No OT Evalulation Needed: No SLP Evaluation Needed: No Abuse/Neglect Assessment (Assessment to be complete while patient is alone) Physical Abuse: Denies Verbal Abuse: Denies Sexual Abuse: Denies Exploitation of patient/patient's resources: Denies Self-Neglect: Denies Values / Beliefs Cultural Requests During Hospitalization: None Spiritual Requests During Hospitalization: None Consults Spiritual Care Consult Needed: No Social Work Consult Needed: No Regulatory affairs officer (For Healthcare) Does patient have an advance directive?: Yes Would patient like information on creating an advanced directive?: No - patient declined information Copy of advanced directive(s) in chart?: No - copy requested    Additional Information 1:1 In Past 12 Months?: No CIRT Risk: No Elopement Risk: No Does patient have medical clearance?: Yes     Disposition:  Disposition Initial Assessment Completed for this Encounter: Yes Disposition of Patient: Other dispositions (treatment in the observation unit per Waylan Boga, DNP) Other disposition(s): Other (Comment)  On Site Evaluation by:   Reviewed with  Physician:    Korene Dula 12/10/2015 1:49 PM

## 2015-12-10 NOTE — ED Notes (Signed)
Pt ride arrived pt escorted off unit by staff

## 2015-12-10 NOTE — ED Triage Notes (Signed)
Pt reports he has been having SI, no plan. Brother recently passed away. Denies HI. Has used etoh to cope, but has not had any lately. Has not been taking medications. Pt tearful in triage.

## 2015-12-10 NOTE — ED Notes (Signed)
Patient awaiting arrival of wife.  He is ready for discharge.  Ordered dinner tray and completed CBG.

## 2015-12-10 NOTE — ED Notes (Signed)
Patient and belongings wanded by security.  

## 2015-12-10 NOTE — Discharge Instructions (Signed)
[facility]    [facility] [place] [address]                 [address] [place],  [place], [place]                  [phone] Both a day and evening program              *Accepts Medicaid  [place].: Substance abuse treatment ctr [address]                 [address] [place], [place] [address] [id number]  [facility]: [facility]    [facility] - Intensive Outpatient [place] [id number] [place], [place] [address]     [place], [place]      [place] [address] [place]. [place], [place] [address] [phone] *Accepts MCD      Residential Treatment Programs ASAP Residential Treatment    [facility] ([place] [address]     [address] [place], [place]      [phone] or [place]     The [address] (Several in [place]) [address]    [place] [address]     [place], [place]      [place]   [facility] ([facility]) [place]     [address] [place],  [address]                 [place], [place]                  [phone] Admissions: 8am-3pm M-F  [facility] [address] [place] [id number] Self-Help/Support [place] of [place]   Narcotics Anonymous (NA) Variety of support groups    [facility] [phone] (call for more info)    [place] - 2 meetings at this location These referrals have been provided to you as appropriate for your clinical needs while taking into account your financial  concerns. Please be aware that agencies, practitioners and insurance companies sometimes change contracts. When calling to make an appointment have your insurance information available so the professional you are going to see can confirm whether they are covered by your plan. Take this form with you in case the person you are seeing needs a copy or to contact us.

## 2015-12-10 NOTE — ED Notes (Signed)
Pt reminded of discharge and pt still awaiting ride home.

## 2015-12-10 NOTE — ED Notes (Signed)
CBG 130 before dinner

## 2015-12-10 NOTE — ED Provider Notes (Addendum)
[facility] DEPT Provider Note   CSN: [id number] Arrival date & time: 12/10/15  1106  First Provider Contact:  First MD Initiated Contact with Patient 12/10/15 1119        History   Chief Complaint Chief Complaint  Patient presents with  . Suicidal    HPI Willie [name] is a 40 y.o. male.  The history is provided by the patient.  Patient presents with depression. States that 2 weeks ago his brother died unexpectedly on a treadmill. States he's been depressed since then but does have a history of depression. Has had apparent previous overdose. Also is been drinking some alcohol. Previous history of chronic alcohol use and pancreatitis. States he did not take his medicines today. States he has not eaten a few days. His depression. Managed by his primary care doctor and does have a therapist. States he had a few suicidal thoughts last night but no plan. States he has been drinking a little alcohol to deal with the depression.  Past Medical History:  Diagnosis Date  . Alcoholic pancreatitis    recurrent  . Depression   . Diabetes mellitus, type II ([place])    New Onset 03/2010  . GERD (gastroesophageal reflux disease)   . High cholesterol   . 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 Hypertension   . Sleep apnea     Patient Active Problem List   Diagnosis Date Noted  . Anxiety state 11/20/2015  . Dental infection 07/10/2015  . Mood disorder ([place]) 04/18/2015  . Alcohol use disorder, severe, dependence ([place]) 12/29/2014  . Eczema 06/27/2014  . GERD (gastroesophageal reflux disease) 05/24/2014  . Snoring 04/07/2013  . Suicide attempt by substance overdose ([place]) 02/10/2013  . Alcohol abuse 12/31/2012  . Erectile dysfunction 12/09/2012  . Hematuria 06/11/2012  . Vision problem 02/02/2012  . Depression with anxiety 11/13/2010  . Hyperlipemia 06/27/2010  . Diabetes type 2, uncontrolled ([place]) 05/22/2010  . Essential  hypertension 05/22/2010    Past Surgical History:  Procedure Laterality Date  . LUMBAR MICRODISCECTOMY  ~ 2004   Dr Ellene Route       Home Medications    Prior to Admission medications   Medication Sig Start Date End Date Taking? Authorizing Provider  amLODipine (NORVASC) 10 MG tablet Take 1 tablet (10 mg total) by mouth daily. 11/20/15  Yes Debbrah Alar, NP  aspirin EC 81 MG tablet Take 1 tablet (81 mg total) by mouth daily. 10/23/15  Yes Debbrah Alar, NP  atorvastatin (LIPITOR) 20 MG tablet Take 1 tablet (20 mg total) by mouth daily. 11/20/15  Yes Debbrah Alar, NP  citalopram (CELEXA) 40 MG tablet Take 1 tablet (40 mg total) by mouth daily. 11/20/15  Yes Debbrah Alar, NP  Insulin Glargine (TOUJEO SOLOSTAR) 300 UNIT/ML SOPN Inject 30 Units into the skin daily. 11/23/15  Yes Debbrah Alar, NP  lisinopril (PRINIVIL,ZESTRIL) 20 MG tablet TAKE 1 TABLET (20 MG TOTAL) BY MOUTH DAILY. 11/20/15  Yes Debbrah Alar, NP  Multiple Vitamin (MULTIVITAMIN WITH MINERALS) TABS tablet Take 2 tablets by mouth daily.   Yes Historical Provider, MD  pantoprazole (PROTONIX) 40 MG tablet Take 1 tablet (40 mg total) by mouth daily. 11/20/15  Yes Debbrah Alar, NP  potassium chloride (KLOR-CON M10) 10 MEQ tablet Take 2 tablets (20 mEq total) by mouth daily. 11/20/15  Yes Debbrah Alar, NP  tadalafil (CIALIS) 10 MG tablet Take 1 tablet (10 mg total) by mouth daily  as needed for erectile dysfunction. 11/09/14  Yes Debbrah Alar, NP  ULTICARE SHORT PEN NEEDLES 31G X 8 MM MISC USE ONCE A DAY TO INJECT INSULIN 07/11/15  Yes Debbrah Alar, NP  valACYclovir (VALTREX) 500 MG tablet TAKE 1 TABLET (500 MG TOTAL) BY MOUTH DAILY. 11/20/15  Yes Debbrah Alar, NP  zolpidem (AMBIEN) 5 MG tablet Take 1 tablet (5 mg total) by mouth at bedtime as needed for sleep. 12/05/15  Yes Debbrah Alar, NP    Family History Family History  Problem Relation Age of Onset  . Diabetes Mother     . Lung cancer Brother     twin brother  . Pancreatic cancer Paternal Aunt   . Colon cancer Neg Hx     Social History Social History  Substance Use Topics  . Smoking status: Never Smoker  . Smokeless tobacco: Never Used  . Alcohol use Yes     Comment: last drink 3 days ago     Allergies   Invokamet [canagliflozin-metformin hcl] and Metformin and related   Review of Systems Review of Systems  Constitutional: Positive for appetite change. Negative for activity change.  Eyes: Negative for pain.  Respiratory: Negative for chest tightness and shortness of breath.   Cardiovascular: Negative for chest pain and leg swelling.  Gastrointestinal: Negative for abdominal pain, diarrhea, nausea and vomiting.  Genitourinary: Negative for flank pain.  Musculoskeletal: Negative for back pain and neck stiffness.  Skin: Negative for rash.  Neurological: Negative for weakness, numbness and headaches.  Psychiatric/Behavioral: Positive for dysphoric mood and suicidal ideas. Negative for behavioral problems.     Physical Exam Updated Vital Signs BP (!) 135/104   Pulse 114   Temp 97.9 F (36.6 C) (Oral)   Resp 16   SpO2 95%   Physical Exam  Constitutional: He appears well-developed.  HENT:  Head: Atraumatic.  Eyes: EOM are normal.  Neck: Neck supple.  Cardiovascular: Normal rate.   Pulmonary/Chest: Effort normal.  Abdominal: Soft.  Musculoskeletal: Normal range of motion.  Neurological: He is alert.  Skin: Skin is warm.  Psychiatric: His behavior is normal.  Patient appears somewhat depressed.     ED Treatments / Results  Labs (all labs ordered are listed, but only abnormal results are displayed) Labs Reviewed  COMPREHENSIVE METABOLIC PANEL - Abnormal; Notable for the following:       Result Value   Potassium 3.2 (*)    Glucose, Bld 189 (*)    Total Protein 8.6 (*)    AST 94 (*)    ALT 70 (*)    Anion gap 16 (*)    All other components within normal limits  ETHANOL  - Abnormal; Notable for the following:    Alcohol, Ethyl (B) 162 (*)    All other components within normal limits  ACETAMINOPHEN LEVEL - Abnormal; Notable for the following:    Acetaminophen (Tylenol), Serum <10 (*)    All other components within normal limits  CBC - Abnormal; Notable for the following:    WBC 11.9 (*)    Platelets 436 (*)    All other components within normal limits  SALICYLATE LEVEL  URINE RAPID DRUG SCREEN, HOSP PERFORMED    EKG  EKG Interpretation None       Radiology No results found.  Procedures Procedures (including critical care time)  Medications Ordered in ED Medications  LORazepam (ATIVAN) tablet 1 mg (not administered)    Or  LORazepam (ATIVAN) injection 1 mg (not administered)  thiamine (VITAMIN B-1)  tablet 100 mg (not administered)    Or  thiamine (B-1) injection 100 mg (not administered)  folic acid (FOLVITE) tablet 1 mg (not administered)  multivitamin with minerals tablet 1 tablet (not administered)  LORazepam (ATIVAN) tablet 0-4 mg (not administered)    Followed by  LORazepam (ATIVAN) tablet 0-4 mg (not administered)  aspirin EC tablet 81 mg (not administered)  atorvastatin (LIPITOR) tablet 20 mg (not administered)  amLODipine (NORVASC) tablet 10 mg (not administered)  Insulin Glargine SOPN 30 Units (not administered)  lisinopril (PRINIVIL,ZESTRIL) tablet 20 mg (not administered)  pantoprazole (PROTONIX) EC tablet 40 mg (not administered)  potassium chloride SA (K-DUR,KLOR-CON) CR tablet 20 mEq (not administered)  zolpidem (AMBIEN) tablet 5 mg (not administered)  citalopram (CELEXA) tablet 10 mg (not administered)  ibuprofen (ADVIL,MOTRIN) tablet 600 mg (not administered)  acetaminophen (TYLENOL) tablet 650 mg (not administered)  ondansetron (ZOFRAN) tablet 4 mg (not administered)  LORazepam (ATIVAN) tablet 0-4 mg (not administered)    Followed by  LORazepam (ATIVAN) tablet 0-4 mg (not administered)     Initial Impression /  Assessment and Plan / ED Course  I have reviewed the triage vital signs and the nursing notes.  Pertinent labs & imaging results that were available during my care of the patient were reviewed by me and considered in my medical decision making (see chart for details).  Clinical Course    Patient with depression after death of a brother. Some suicidal thoughts. Some alcohol use and previous history of pancreatitis. Appears medically cleared at this time. To be seen by TTS and  Final Clinical Impressions(s) / ED Diagnoses   Final diagnoses:  Grief  Suicidal thoughts    New Prescriptions New Prescriptions   No medications on file     Davonna Belling, MD 12/10/15 1305   Patient is now started to complain of abdominal pain. States this felt like his pancreatitis. We'll now add a lipase. He had not complained of abdominal pain until now.   Davonna Belling, MD 12/10/15 1312   Patient's lipase is normal, making pancreatitis less likely.   Davonna Belling, MD 12/10/15 [phone]

## 2015-12-10 NOTE — ED Notes (Signed)
Patient complains of lower quadrant abdominal pain with nausea.  He has not had any emisis but is continuously gagging and burping.  Lipase level ordered and drawn.  Patient states he has a hx of pancreatitis.  Patient denies any thoughts of self harm at this time.

## 2015-12-11 ENCOUNTER — Encounter: Payer: Self-pay | Admitting: Family

## 2015-12-12 ENCOUNTER — Telehealth: Payer: Self-pay | Admitting: Family

## 2015-12-12 NOTE — Telephone Encounter (Signed)
Relationship to patient: self Can be [phone]   Reason for call: Pt called in stating he discussed depression with [facility] and is needing some help for an RX. I was unclear on if he is taking the previous meds prescribed. Please f/u with pt.

## 2015-12-13 NOTE — Telephone Encounter (Signed)
Is he taking citalopram?  I would like to see him back in the office.  OK to double book if we need to this week.

## 2015-12-13 NOTE — Telephone Encounter (Signed)
Spoke with pt. States he has been having a lot of panic attacks recently. Has been taking ativan at night but it doesn't seem to be helping him sleep any longer. Scheduled pt appt with PCP for Friday at 7am.

## 2015-12-15 ENCOUNTER — Ambulatory Visit: Payer: Self-pay | Admitting: Family

## 2015-12-15 DIAGNOSIS — Z0289 Encounter for other administrative examinations: Secondary | ICD-10-CM

## 2015-12-19 ENCOUNTER — Encounter: Payer: Self-pay | Admitting: Family

## 2015-12-21 ENCOUNTER — Ambulatory Visit: Payer: 59 | Admitting: Licensed Clinical Social Worker

## 2015-12-27 ENCOUNTER — Telehealth: Payer: Self-pay | Admitting: *Deleted

## 2015-12-27 MED ORDER — INSULIN GLARGINE 300 UNIT/ML ~~LOC~~ SOPN: 30.0000 [IU] | PEN_INJECTOR | Freq: Every day | SUBCUTANEOUS | 1 refills | Status: DC

## 2015-12-27 MED FILL — TOUJEO SOLOSTAR 300 UNITS/M: 300 | 45 days supply | Qty: 5 | Fill #0

## 2015-12-27 NOTE — Telephone Encounter (Signed)
Received call from Chad at [facility] requesting new Rx for toujeo since dose has increased. Gave verbal for 30 units once a day, 98mL x 1 refill.

## 2016-01-23 MED FILL — ULTICARE PEN NDL 8MM 31G: 31G X 8 MM | 90 days supply | Qty: 100 | Fill #1

## 2016-02-12 ENCOUNTER — Emergency Department (HOSPITAL_BASED_OUTPATIENT_CLINIC_OR_DEPARTMENT_OTHER)
Admission: EM | Admit: 2016-02-12 | Discharge: 2016-02-12 | Disposition: A | Discharge status: Home / Self Care | Payer: 59 | Attending: Emergency Medicine | Admitting: Emergency Medicine

## 2016-02-12 ENCOUNTER — Telehealth: Payer: Self-pay | Admitting: Family

## 2016-02-12 ENCOUNTER — Encounter (HOSPITAL_BASED_OUTPATIENT_CLINIC_OR_DEPARTMENT_OTHER): Payer: Self-pay | Admitting: *Deleted

## 2016-02-12 DIAGNOSIS — K529 Noninfective gastroenteritis and colitis, unspecified: Secondary | ICD-10-CM | POA: Diagnosis not present

## 2016-02-12 DIAGNOSIS — I10 Essential (primary) hypertension: Secondary | ICD-10-CM | POA: Diagnosis not present

## 2016-02-12 DIAGNOSIS — K7010 Alcoholic hepatitis without ascites: Secondary | ICD-10-CM | POA: Insufficient documentation

## 2016-02-12 DIAGNOSIS — Z7982 Long term (current) use of aspirin: Secondary | ICD-10-CM | POA: Insufficient documentation

## 2016-02-12 DIAGNOSIS — Z794 Long term (current) use of insulin: Secondary | ICD-10-CM | POA: Diagnosis not present

## 2016-02-12 DIAGNOSIS — E119 Type 2 diabetes mellitus without complications: Secondary | ICD-10-CM | POA: Diagnosis not present

## 2016-02-12 DIAGNOSIS — Z79899 Other long term (current) drug therapy: Secondary | ICD-10-CM | POA: Diagnosis not present

## 2016-02-12 DIAGNOSIS — R112 Nausea with vomiting, unspecified: Secondary | ICD-10-CM | POA: Diagnosis not present

## 2016-02-12 LAB — URINALYSIS, ROUTINE W REFLEX MICROSCOPIC
BILIRUBIN URINE: NEGATIVE
Glucose, UA: >1000 mg/dL — AB
HGB URINE DIPSTICK: NEGATIVE
Ketones, ur: 40 mg/dL — AB
Leukocytes, UA: NEGATIVE
NITRITE: NEGATIVE
PROTEIN: 30 mg/dL — AB
Specific Gravity, Urine: 1.023 (ref 1.005–1.030)
pH: 5.5 (ref 5.0–8.0)

## 2016-02-12 LAB — COMPREHENSIVE METABOLIC PANEL
ALT: 230 U/L — AB (ref 17–63)
ANION GAP: 13 (ref 5–15)
AST: 371 U/L — ABNORMAL HIGH (ref 15–41)
Albumin: 4.3 g/dL (ref 3.5–5.0)
Alkaline Phosphatase: 117 U/L (ref 38–126)
BUN: 8 mg/dL (ref 6–20)
CALCIUM: 9.2 mg/dL (ref 8.9–10.3)
CHLORIDE: 94 mmol/L — AB (ref 101–111)
CO2: 26 mmol/L (ref 22–32)
CREATININE: 0.8 mg/dL (ref 0.61–1.24)
Glucose, Bld: 223 mg/dL — ABNORMAL HIGH (ref 65–99)
Potassium: 3.4 mmol/L — ABNORMAL LOW (ref 3.5–5.1)
Sodium: 133 mmol/L — ABNORMAL LOW (ref 135–145)
Total Bilirubin: 2 mg/dL — ABNORMAL HIGH (ref 0.3–1.2)
Total Protein: 8 g/dL (ref 6.5–8.1)

## 2016-02-12 LAB — SALICYLATE LEVEL: Salicylate Lvl: <7 mg/dL (ref 2.8–30.0)

## 2016-02-12 LAB — RAPID URINE DRUG SCREEN, HOSP PERFORMED
Amphetamines: NOT DETECTED
BARBITURATES: NOT DETECTED
Benzodiazepines: NOT DETECTED
Cocaine: NOT DETECTED
Opiates: NOT DETECTED
TETRAHYDROCANNABINOL: NOT DETECTED

## 2016-02-12 LAB — URINE MICROSCOPIC-ADD ON: Bacteria, UA: NONE SEEN

## 2016-02-12 LAB — CBG MONITORING, ED: GLUCOSE-CAPILLARY: 207 mg/dL — AB (ref 65–99)

## 2016-02-12 LAB — LIPASE, BLOOD: LIPASE: 12 U/L (ref 11–51)

## 2016-02-12 LAB — ACETAMINOPHEN LEVEL: Acetaminophen (Tylenol), Serum: <10 ug/mL — ABNORMAL LOW (ref 10–30)

## 2016-02-12 LAB — ETHANOL

## 2016-02-12 MED ORDER — SODIUM CHLORIDE 0.9 % IV BOLUS (SEPSIS)
1000.0000 mL | Freq: Once | INTRAVENOUS | Status: AC
  Administered 2016-02-12: 1000 mL via INTRAVENOUS

## 2016-02-12 MED ORDER — ONDANSETRON 8 MG PO TBDP: ORAL_TABLET | ORAL | 0 refills | Status: DC

## 2016-02-12 MED ORDER — DICYCLOMINE HCL 10 MG/ML IM SOLN
20.0000 mg | Freq: Once | INTRAMUSCULAR | Status: AC
  Administered 2016-02-12: 20 mg via INTRAMUSCULAR
  Filled 2016-02-12: qty 2

## 2016-02-12 MED ORDER — METOCLOPRAMIDE HCL 5 MG/ML IJ SOLN
10.0000 mg | Freq: Once | INTRAMUSCULAR | Status: AC
  Administered 2016-02-12: 10 mg via INTRAVENOUS
  Filled 2016-02-12: qty 2

## 2016-02-12 MED ORDER — ONDANSETRON HCL 4 MG/2ML IJ SOLN
4.0000 mg | Freq: Once | INTRAMUSCULAR | Status: AC
  Administered 2016-02-12: 4 mg via INTRAVENOUS
  Filled 2016-02-12: qty 2

## 2016-02-12 MED ORDER — DICYCLOMINE HCL 20 MG PO TABS: 20.0000 mg | ORAL_TABLET | Freq: Two times a day (BID) | ORAL | 0 refills | Status: DC

## 2016-02-12 MED ORDER — THIAMINE HCL 100 MG PO TABS: 100.0000 mg | ORAL_TABLET | Freq: Every day | ORAL | 0 refills | Status: DC

## 2016-02-12 MED ORDER — SODIUM CHLORIDE 0.9 % IV BOLUS (SEPSIS)
500.0000 mL | Freq: Once | INTRAVENOUS | Status: AC
  Administered 2016-02-12: 500 mL via INTRAVENOUS

## 2016-02-12 MED ORDER — THERA VITAL M PO TABS: 1.0000 | ORAL_TABLET | Freq: Every day | ORAL | 0 refills | Status: DC

## 2016-02-12 MED ORDER — KETOROLAC TROMETHAMINE 30 MG/ML IJ SOLN
30.0000 mg | Freq: Once | INTRAMUSCULAR | Status: AC
  Administered 2016-02-12: 30 mg via INTRAVENOUS
  Filled 2016-02-12: qty 1

## 2016-02-12 MED FILL — VITAMIN B-1 100 MG TABLET: 100 | 100 days supply | Qty: 100 | Fill #0

## 2016-02-12 MED FILL — DICYCLOMINE 20 MG TABLET: 20 | 10 days supply | Qty: 20 | Fill #0

## 2016-02-12 MED FILL — ONDANSETRON ODT 8 MG TABLET: 8 | 4 days supply | Qty: 12 | Fill #0

## 2016-02-12 NOTE — Telephone Encounter (Signed)
Please contact pt to arrange ED follow up. 

## 2016-02-12 NOTE — ED Provider Notes (Signed)
[facility] Provider Note   CSN: [id number] Arrival date & time: 02/12/16  0340     History   Chief Complaint Chief Complaint  Patient presents with  . Emesis    HPI Willie [name] is a 40 y.o. male.  The history is provided by the patient.  Emesis   This is a new problem. The current episode started 12 to 24 hours ago. The problem occurs more than 10 times per day. The problem has not changed since onset.The emesis has an appearance of stomach contents. There has been no fever. Associated symptoms include diarrhea and headaches. Pertinent negatives include no arthralgias, no chills, no fever and no myalgias. Risk factors: none.  Diarrhea   This is a new problem. The current episode started 12 to 24 hours ago. The problem occurs 2 to 4 times per day. The problem has not changed since onset.The stool consistency is described as watery. There has been no fever. Associated symptoms include vomiting and headaches. Pertinent negatives include no chills, no arthralgias and no myalgias. He has tried nothing for the symptoms. The treatment provided no relief. His past medical history does not include irritable bowel syndrome, inflammatory bowel disease or short gut syndrome.  Diarrhea started first then vomiting and cramping. Later headache started.  Last ETOH 2 days earlier  Past Medical History:  Diagnosis Date  . Alcoholic pancreatitis    recurrent  . Depression   . Diabetes mellitus, type II ([place])    New Onset 03/2010  . GERD (gastroesophageal reflux disease)   . High cholesterol   . 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 Hypertension   . Sleep apnea     Patient Active Problem List   Diagnosis Date Noted  . Alcohol abuse with unspecified alcohol-induced disorder ([place]) 12/10/2015  . Anxiety state 11/20/2015  . Dental infection 07/10/2015  . Mood disorder ([place]) 04/18/2015  . Alcohol use disorder, severe,  dependence ([place]) 12/29/2014  . Eczema 06/27/2014  . GERD (gastroesophageal reflux disease) 05/24/2014  . Snoring 04/07/2013  . Suicide attempt by substance overdose ([place]) 02/10/2013  . Alcohol abuse 12/31/2012  . Erectile dysfunction 12/09/2012  . Hematuria 06/11/2012  . Vision problem 02/02/2012  . Depression with anxiety 11/13/2010  . Hyperlipemia 06/27/2010  . Diabetes type 2, uncontrolled ([place]) 05/22/2010  . Essential hypertension 05/22/2010    Past Surgical History:  Procedure Laterality Date  . LUMBAR MICRODISCECTOMY  ~ 2004   Dr Ellene Route       Home Medications    Prior to Admission medications   Medication Sig Start Date End Date Taking? Authorizing Provider  amLODipine (NORVASC) 10 MG tablet Take 1 tablet (10 mg total) by mouth daily. 11/20/15   Debbrah Alar, NP  aspirin EC 81 MG tablet Take 1 tablet (81 mg total) by mouth daily. 10/23/15   Debbrah Alar, NP  atorvastatin (LIPITOR) 20 MG tablet Take 1 tablet (20 mg total) by mouth daily. 11/20/15   Debbrah Alar, NP  citalopram (CELEXA) 40 MG tablet Take 1 tablet (40 mg total) by mouth daily. 11/20/15   Debbrah Alar, NP  Insulin Glargine (TOUJEO SOLOSTAR) 300 UNIT/ML SOPN Inject 30 Units into the skin daily. 12/27/15   Debbrah Alar, NP  lisinopril (PRINIVIL,ZESTRIL) 20 MG tablet TAKE 1 TABLET (20 MG TOTAL) BY MOUTH DAILY. 11/20/15   Debbrah Alar, NP  Multiple Vitamin (MULTIVITAMIN WITH MINERALS) TABS tablet Take 2 tablets by mouth  daily.    Historical Provider, MD  pantoprazole (PROTONIX) 40 MG tablet Take 1 tablet (40 mg total) by mouth daily. 11/20/15   Debbrah Alar, NP  potassium chloride (KLOR-CON M10) 10 MEQ tablet Take 2 tablets (20 mEq total) by mouth daily. 11/20/15   Debbrah Alar, NP  tadalafil (CIALIS) 10 MG tablet Take 1 tablet (10 mg total) by mouth daily as needed for erectile dysfunction. 11/09/14   Debbrah Alar, NP  ULTICARE SHORT PEN NEEDLES 31G X 8 MM MISC USE  ONCE A DAY TO INJECT INSULIN 07/11/15   Debbrah Alar, NP  valACYclovir (VALTREX) 500 MG tablet TAKE 1 TABLET (500 MG TOTAL) BY MOUTH DAILY. 11/20/15   Debbrah Alar, NP  zolpidem (AMBIEN) 5 MG tablet Take 1 tablet (5 mg total) by mouth at bedtime as needed for sleep. 12/05/15   Debbrah Alar, NP    Family History Family History  Problem Relation Age of Onset  . Diabetes Mother   . Lung cancer Brother     twin brother  . Pancreatic cancer Paternal Aunt   . Colon cancer Neg Hx     Social History Social History  Substance Use Topics  . Smoking status: Never Smoker  . Smokeless tobacco: Never Used  . Alcohol use Yes     Comment: last drink 2 days ago     Allergies   Invokamet [canagliflozin-metformin hcl] and Metformin and related   Review of Systems Review of Systems  Constitutional: Negative for chills and fever.  HENT: Negative for sore throat.   Eyes: Negative for photophobia.  Respiratory: Negative for shortness of breath.   Cardiovascular: Negative for chest pain.  Gastrointestinal: Positive for diarrhea, nausea and vomiting. Negative for anal bleeding.  Genitourinary: Negative for dysuria.  Musculoskeletal: Negative for arthralgias and myalgias.  Neurological: Positive for headaches. Negative for speech difficulty and numbness.  All other systems reviewed and are negative.    Physical Exam Updated Vital Signs BP (!) 140/101   Pulse 107   Temp 99 F (37.2 C) (Oral)   Resp 20   Ht 5\' 11"  (1.803 m)   Wt 220 lb (99.8 kg)   SpO2 100%   BMI 30.68 kg/m   Physical Exam  Constitutional: He is oriented to person, place, and time. He appears well-developed and well-nourished. No distress.  HENT:  Head: Normocephalic and atraumatic.  Mouth/Throat: No oropharyngeal exudate.  Eyes: Conjunctivae and EOM are normal. Pupils are equal, round, and reactive to light.  Neck: Normal range of motion. Neck supple.  Cardiovascular: Regular rhythm, normal heart  sounds, intact distal pulses and normal pulses.  Tachycardia present.   Pulmonary/Chest: Effort normal and breath sounds normal. No respiratory distress. He has no wheezes. He has no rales.  Abdominal: Soft. He exhibits no mass. Bowel sounds are increased. There is no tenderness. There is no rigidity, no rebound, no guarding, no tenderness at McBurney's point and negative Murphy's sign.  Musculoskeletal: Normal range of motion.  Lymphadenopathy:    He has no cervical adenopathy.  Neurological: He is alert and oriented to person, place, and time. He has normal reflexes.  Skin: Skin is warm and dry. Capillary refill takes less than 2 seconds.  Psychiatric: Thought content normal.     ED Treatments / Results   Vitals:   02/12/16 0405 02/12/16 0500  BP: 142/99 (!) 140/101  Pulse: (!) 124 107  Resp: 20   Temp: 99 F (37.2 C)    Results for orders placed or performed during  the hospital encounter of 02/12/16  Comprehensive metabolic panel  Result Value Ref Range   Sodium 133 (L) 135 - 145 mmol/L   Potassium 3.4 (L) 3.5 - 5.1 mmol/L   Chloride 94 (L) 101 - 111 mmol/L   CO2 26 22 - 32 mmol/L   Glucose, Bld 223 (H) 65 - 99 mg/dL   BUN 8 6 - 20 mg/dL   Creatinine, Ser 0.80 0.61 - 1.24 mg/dL   Calcium 9.2 8.9 - 10.3 mg/dL   Total Protein 8.0 6.5 - 8.1 g/dL   Albumin 4.3 3.5 - 5.0 g/dL   AST 371 (H) 15 - 41 U/L   ALT 230 (H) 17 - 63 U/L   Alkaline Phosphatase 117 38 - 126 U/L   Total Bilirubin 2.0 (H) 0.3 - 1.2 mg/dL   GFR calc non Af Amer >60 >60 mL/min   GFR calc Af Amer >60 >60 mL/min   Anion gap 13 5 - 15  Lipase, blood  Result Value Ref Range   Lipase 12 11 - 51 U/L  Urinalysis, Routine w reflex microscopic (not at [facility])  Result Value Ref Range   Color, Urine AMBER (A) YELLOW   APPearance CLEAR CLEAR   Specific Gravity, Urine 1.023 1.005 - 1.030   pH 5.5 5.0 - 8.0   Glucose, UA >1000 (A) NEGATIVE mg/dL   Hgb urine dipstick NEGATIVE NEGATIVE   Bilirubin Urine NEGATIVE  NEGATIVE   Ketones, ur 40 (A) NEGATIVE mg/dL   Protein, ur 30 (A) NEGATIVE mg/dL   Nitrite NEGATIVE NEGATIVE   Leukocytes, UA NEGATIVE NEGATIVE  Ethanol  Result Value Ref Range   Alcohol, Ethyl (B) <5 <5 mg/dL  Acetaminophen level  Result Value Ref Range   Acetaminophen (Tylenol), Serum <10 (L) 10 - 30 ug/mL  Salicylate level  Result Value Ref Range   Salicylate Lvl [id number] 2.8 - 30.0 mg/dL  Rapid urine drug screen (hospital performed)  Result Value Ref Range   Opiates NONE DETECTED NONE DETECTED   Cocaine NONE DETECTED NONE DETECTED   Benzodiazepines NONE DETECTED NONE DETECTED   Amphetamines NONE DETECTED NONE DETECTED   Tetrahydrocannabinol NONE DETECTED NONE DETECTED   Barbiturates NONE DETECTED NONE DETECTED  Urine microscopic-add on  Result Value Ref Range   Squamous Epithelial / LPF 0-5 (A) NONE SEEN   WBC, UA 0-5 0 - 5 WBC/hpf   RBC / HPF 0-5 0 - 5 RBC/hpf   Bacteria, UA NONE SEEN NONE SEEN   Urine-Other MUCOUS PRESENT   POC CBG, ED  Result Value Ref Range   Glucose-Capillary 207 (H) 65 - 99 mg/dL   No results found.   Procedures Procedures (including critical care time)  Medications Ordered in ED Medications  metoCLOPramide (REGLAN) injection 10 mg (not administered)  sodium chloride 0.9 % bolus 1,000 mL (0 mLs Intravenous Stopped 02/12/16 0451)  ondansetron (ZOFRAN) injection 4 mg (4 mg Intravenous Given 02/12/16 0435)  dicyclomine (BENTYL) injection 20 mg (20 mg Intramuscular Given 02/12/16 0435)  ketorolac (TORADOL) 30 MG/ML injection 30 mg (30 mg Intravenous Given 02/12/16 0435)  sodium chloride 0.9 % bolus 500 mL (0 mLs Intravenous Stopped 02/12/16 0525)    Doing well post medication.  No further vomiting or diarrhea.  Patient is feeling improved.    Final Clinical Impressions(s) / ED Diagnoses   Alcoholic liver disease.  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  Patient is advised to stop alcohol completely and follow up with his  doctor for ongoing care and  treatment and recheck of labs within 5 days.  Will give RX for zofran.  Have advised a MVI with thiamine and eating a bland well balanced diet.  Take your insulin as directed.  Return for fevers, intractable vomiting, pain that localized to the right upper or lower quadrant or any concerns.  All questions answered to patient's satisfaction. Based on history and exam patient has been appropriately medically screened and emergency conditions excluded. Patient is stable for discharge at this time. Follow up with your PMD for recheck in 2 days and strict return precautions given.    Veatrice Kells, MD 02/12/16 [phone]

## 2016-02-12 NOTE — ED Triage Notes (Signed)
C/o nvd, onset Saturday morning, v x20 in last 24 hrs, d x3 in last 24 hrs, describes as watery and soft, last ETOH 2d ago, (denies: fever, cramping, bleeding or dizziness), "maybe some abd pain from vomiting, does not feel like past pancreatitis", also HA, rates pain 4/10, unable to keep anything including regular meds down. Alert, NAD, anxious, jittery.

## 2016-02-12 NOTE — Telephone Encounter (Signed)
Patient has been schedule for hospital f/u 02/20/16 PC

## 2016-02-15 MED FILL — TOUJEO SOLOSTAR 300 UNITS/M: 300 | 45 days supply | Qty: 5 | Fill #1

## 2016-02-16 ENCOUNTER — Encounter (HOSPITAL_BASED_OUTPATIENT_CLINIC_OR_DEPARTMENT_OTHER): Payer: Self-pay

## 2016-02-16 ENCOUNTER — Encounter: Payer: Self-pay | Admitting: Family

## 2016-02-16 ENCOUNTER — Ambulatory Visit (HOSPITAL_BASED_OUTPATIENT_CLINIC_OR_DEPARTMENT_OTHER)
Admission: RE | Admit: 2016-02-16 | Discharge: 2016-02-16 | Disposition: A | Discharge status: Home / Self Care | Payer: 59 | Source: Ambulatory Visit | Attending: Family | Admitting: Family

## 2016-02-16 ENCOUNTER — Ambulatory Visit (INDEPENDENT_AMBULATORY_CARE_PROVIDER_SITE_OTHER): Payer: 59 | Admitting: Family

## 2016-02-16 ENCOUNTER — Telehealth: Payer: Self-pay | Admitting: Gastroenterology

## 2016-02-16 ENCOUNTER — Telehealth: Payer: Self-pay | Admitting: Family

## 2016-02-16 VITALS — BP 136/88 | HR 94 | Temp 98.1°F | Resp 16 | Ht 71.5 in | Wt 214.2 lb

## 2016-02-16 DIAGNOSIS — R109 Unspecified abdominal pain: Secondary | ICD-10-CM | POA: Insufficient documentation

## 2016-02-16 DIAGNOSIS — K922 Gastrointestinal hemorrhage, unspecified: Secondary | ICD-10-CM | POA: Diagnosis not present

## 2016-02-16 DIAGNOSIS — K625 Hemorrhage of anus and rectum: Secondary | ICD-10-CM | POA: Diagnosis not present

## 2016-02-16 DIAGNOSIS — K2971 Gastritis, unspecified, with bleeding: Secondary | ICD-10-CM | POA: Diagnosis not present

## 2016-02-16 LAB — CBC WITH DIFFERENTIAL/PLATELET
BASOS ABS: 0 10*3/uL (ref 0.0–0.1)
BASOS PCT: 0.6 % (ref 0.0–3.0)
Eosinophils Absolute: 0.1 10*3/uL (ref 0.0–0.7)
Eosinophils Relative: 2 % (ref 0.0–5.0)
HEMATOCRIT: 40.8 % (ref 39.0–52.0)
Hemoglobin: 13.8 g/dL (ref 13.0–17.0)
LYMPHS ABS: 1.8 10*3/uL (ref 0.7–4.0)
LYMPHS PCT: 26.2 % (ref 12.0–46.0)
MCHC: 33.9 g/dL (ref 30.0–36.0)
MCV: 87.8 fl (ref 78.0–100.0)
MONOS PCT: 5.6 % (ref 3.0–12.0)
Monocytes Absolute: 0.4 10*3/uL (ref 0.1–1.0)
NEUTROS ABS: 4.5 10*3/uL (ref 1.4–7.7)
NEUTROS PCT: 65.6 % (ref 43.0–77.0)
PLATELETS: 293 10*3/uL (ref 150.0–400.0)
RBC: 4.64 Mil/uL (ref 4.22–5.81)
RDW: 13.5 % (ref 11.5–15.5)
WBC: 6.9 10*3/uL (ref 4.0–10.5)

## 2016-02-16 LAB — COMPREHENSIVE METABOLIC PANEL
ALT: 97 U/L — AB (ref 0–53)
AST: 55 U/L — AB (ref 0–37)
Albumin: 4.4 g/dL (ref 3.5–5.2)
Alkaline Phosphatase: 114 U/L (ref 39–117)
BILIRUBIN TOTAL: 0.5 mg/dL (ref 0.2–1.2)
BUN: 4 mg/dL — ABNORMAL LOW (ref 6–23)
CHLORIDE: 97 meq/L (ref 96–112)
CO2: 30 meq/L (ref 19–32)
Calcium: 9.5 mg/dL (ref 8.4–10.5)
Creatinine, Ser: 0.89 mg/dL (ref 0.40–1.50)
GFR: 122.17 mL/min (ref >60.00)
GLUCOSE: 436 mg/dL — AB (ref 70–99)
Potassium: 3.8 mEq/L (ref 3.5–5.1)
Sodium: 134 mEq/L — ABNORMAL LOW (ref 135–145)
Total Protein: 7.5 g/dL (ref 6.0–8.3)

## 2016-02-16 LAB — POCT HEMOGLOBIN: Hemoglobin: 14.4 g/dL (ref 14.1–18.1)

## 2016-02-16 LAB — LIPASE: Lipase: 5 U/L — ABNORMAL LOW (ref 11.0–59.0)

## 2016-02-16 MED ORDER — IOPAMIDOL (ISOVUE-300) INJECTION 61%
100.0000 mL | Freq: Once | INTRAVENOUS | Status: AC | PRN
  Administered 2016-02-16: 100 mL via INTRAVENOUS

## 2016-02-16 MED ORDER — OXYCODONE HCL 5 MG PO CAPS: 5.0000 mg | ORAL_CAPSULE | Freq: Four times a day (QID) | ORAL | 0 refills | Status: DC | PRN

## 2016-02-16 MED FILL — oxyCODONE HCL 5 MG TABS: 5 | 5 days supply | Qty: 20 | Fill #0

## 2016-02-16 NOTE — Patient Instructions (Signed)
Please complete lab work prior to leaving. Complete CT scan on the first floor. Go to the ER over the weekend if you continue to have blood in your stools, if unable to keep down food/liquid or if worsening diarrhea/abdominal pain.

## 2016-02-16 NOTE — Telephone Encounter (Signed)
Rectal bleeding, appt with Dr Ardis Hughs on 02/26/16 845 am. Pt aware

## 2016-02-16 NOTE — Progress Notes (Signed)
Subjective:    Patient ID: Willie [name], male    DOB: Apr 27, 1976, 40 y.o.   MRN: [id number]  HPI  Willie [name] is a 40 yr old male who presents today for ED follow up.  ED record is reviewed. He was evaluated on 10/16 with c/o diarrhea/vomitting.  His AST/ALT were both elevated:  371/230.   Lipase was normal. Today he reports dark blood and bright red blood in stool. This is not occurring with every bowel movement. He continues to have frequent diarrhea. Also reports lower abdominal pain and pain radiating to his back. Night sweats x 2 weeks.  Reports that vomiting has resolved. He reports that he first noticed blood in the stool 2 days ago.  Has L sided low back pain, worse with walking.  Notes that he has been waking up at night "drenched in sweat."  Then getting chills.  Denies recent antibiotics.    Review of Systems See HPI  Past Medical History:  Diagnosis Date  . Alcoholic pancreatitis    recurrent  . Depression   . Diabetes mellitus, type II ([place])    New Onset 03/2010  . GERD (gastroesophageal reflux disease)   . High cholesterol   . 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 Hypertension   . Sleep apnea      Social History   Social History  . Marital status: Married    Spouse name: N/A  . Number of children: 2  . Years of education: N/A   Occupational History  . 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 Smoking status: Never Smoker  . Smokeless tobacco: Never Used  . Alcohol use Yes     Comment: last drink 2 days ago  . Drug use: No  . Sexual activity: Not on file   Other Topics Concern  . 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  LUMBAR MICRODISCECTOMY  ~ 2004   Dr Ellene Route    Family History  Problem Relation Age of Onset  . Diabetes Mother   . Lung cancer Brother     twin brother  . Pancreatic cancer Paternal Aunt   . Colon cancer Neg Hx     Allergies  Allergen Reactions  . Invokamet [Canagliflozin-Metformin Hcl] Other (See Comments)    Lactic acidosis  . Metformin And Related     Drastic drop in blood sugar    Current Outpatient Prescriptions on File Prior to Visit  Medication Sig Dispense Refill  . amLODipine (NORVASC) 10 MG tablet Take 1 tablet (10 mg total) by mouth daily. 90 tablet 1  . aspirin EC 81 MG tablet Take 1 tablet (81 mg total) by mouth daily. 90 tablet 1  . atorvastatin (LIPITOR) 20 MG tablet Take 1 tablet (20 mg total) by mouth daily. 90 tablet 1  . citalopram (CELEXA) 40 MG tablet Take 1 tablet (40 mg total) by mouth daily. 90 tablet 1  . dicyclomine (BENTYL) 20 MG tablet Take 1 tablet (20 mg total) by mouth 2 (two) times daily. 20 tablet  0  . Insulin Glargine (TOUJEO SOLOSTAR) 300 UNIT/ML SOPN Inject 30 Units into the skin daily. 10 mL 1  . lisinopril (PRINIVIL,ZESTRIL) 20 MG tablet TAKE 1 TABLET (20 MG TOTAL) BY MOUTH DAILY. 90 tablet 1  . Multiple Vitamins-Minerals (MULTIVITAMIN) tablet Take 1 tablet by mouth daily. 30 tablet 0  . ondansetron (ZOFRAN ODT) 8 MG disintegrating tablet 8mg  ODT q8 hours prn nausea 12 tablet 0  . pantoprazole (PROTONIX) 40 MG tablet Take 1 tablet (40 mg total) by mouth daily. 90 tablet 1  . potassium chloride (KLOR-CON M10) 10 MEQ tablet Take 2 tablets (20 mEq total) by mouth daily. 180 tablet 1  . tadalafil (CIALIS) 10 MG tablet Take 1 tablet (10 mg total) by mouth daily as needed for erectile dysfunction. 10 tablet 5  . thiamine 100 MG tablet Take 1 tablet (100 mg total) by mouth daily. 30 tablet 0  . ULTICARE SHORT PEN NEEDLES 31G X 8 MM MISC USE ONCE A DAY TO INJECT INSULIN 100 each 1  . valACYclovir (VALTREX) 500 MG tablet TAKE 1 TABLET (500 MG TOTAL)  BY MOUTH DAILY. 90 tablet 1  . zolpidem (AMBIEN) 5 MG tablet Take 1 tablet (5 mg total) by mouth at bedtime as needed for sleep. 30 tablet 0   No current facility-administered medications on file prior to visit.     BP 136/88 (BP Location: Left Arm, Patient Position: Sitting, Cuff Size: Normal)   Pulse 94   Temp 98.1 F (36.7 C) (Oral)   Resp 16   Ht 5' 11.5" (1.816 m)   Wt 214 lb 3.2 oz (97.2 kg)   SpO2 98%   BMI 29.46 kg/m       Objective:   Physical Exam  Constitutional: He is oriented to person, place, and time. He appears well-developed and well-nourished. No distress.  HENT:  Head: Normocephalic and atraumatic.  Cardiovascular: Normal rate and regular rhythm.   No murmur heard. Pulmonary/Chest: Effort normal and breath sounds normal. No respiratory distress. He has no wheezes. He has no rales.  Abdominal: Soft. Bowel sounds are normal. There is tenderness in the epigastric area and left lower quadrant. There is no rebound and no guarding.  Musculoskeletal: He exhibits no edema.  Neurological: He is alert and oriented to person, place, and time.  Skin: Skin is warm and dry.  Psychiatric: He has a normal mood and affect. His behavior is normal. Thought content normal.          Assessment & Plan:  Abdominal pain/lower GI bleed- Will refer patient to the ED for further evaluation.  ? Diverticulitis.  Hemoglobin is 14.4 today in the office.  I am concerned about his ongoing bleeding.  His Hgb is 14 today in the office.  I have advised pt to go to the ED. 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  I have advised him to go to the ER over the weekend if he has persistent bleeding and he verbalizes understanding. He is also advised to avoid alcohol which he said he is doing.  He is requesting RX for pain. Will avoid tramadol due to interaction with SSRI, need to avoid tylenol due to his abnormal  lft's, avoid nsaids due to bleeding. Will give short course of prn oxycodone.

## 2016-02-16 NOTE — Progress Notes (Signed)
Pre visit review using our clinic review tool, if applicable. No additional management support is needed unless otherwise documented below in the visit note. 

## 2016-02-16 NOTE — Telephone Encounter (Signed)
Sugar is 436. Attempted to reach patient. Left detailed message on cell phone requesting that he go to the ED for further evaluation of his hyperglycemia. Attempted to reach is wife as well and no answer.

## 2016-02-20 ENCOUNTER — Inpatient Hospital Stay: Payer: Self-pay | Admitting: Family

## 2016-02-26 ENCOUNTER — Other Ambulatory Visit (INDEPENDENT_AMBULATORY_CARE_PROVIDER_SITE_OTHER): Payer: 59

## 2016-02-26 ENCOUNTER — Encounter: Payer: Self-pay | Admitting: Gastroenterology

## 2016-02-26 ENCOUNTER — Ambulatory Visit (INDEPENDENT_AMBULATORY_CARE_PROVIDER_SITE_OTHER): Payer: 59 | Admitting: Gastroenterology

## 2016-02-26 VITALS — BP 122/88 | HR 84 | Ht 70.87 in | Wt 215.1 lb

## 2016-02-26 DIAGNOSIS — R7989 Other specified abnormal findings of blood chemistry: Secondary | ICD-10-CM | POA: Diagnosis not present

## 2016-02-26 DIAGNOSIS — K529 Noninfective gastroenteritis and colitis, unspecified: Secondary | ICD-10-CM | POA: Diagnosis not present

## 2016-02-26 DIAGNOSIS — R945 Abnormal results of liver function studies: Principal | ICD-10-CM

## 2016-02-26 DIAGNOSIS — K625 Hemorrhage of anus and rectum: Secondary | ICD-10-CM

## 2016-02-26 LAB — PROTIME-INR
INR: 1 ratio (ref 0.8–1.0)
Prothrombin Time: 10 s (ref 9.6–13.1)

## 2016-02-26 LAB — CBC WITH DIFFERENTIAL/PLATELET
BASOS PCT: 0.7 % (ref 0.0–3.0)
Basophils Absolute: 0.1 10*3/uL (ref 0.0–0.1)
EOS PCT: 1.2 % (ref 0.0–5.0)
Eosinophils Absolute: 0.1 10*3/uL (ref 0.0–0.7)
HCT: 42.4 % (ref 39.0–52.0)
Hemoglobin: 14.4 g/dL (ref 13.0–17.0)
LYMPHS ABS: 1.6 10*3/uL (ref 0.7–4.0)
Lymphocytes Relative: 16.3 % (ref 12.0–46.0)
MCHC: 34 g/dL (ref 30.0–36.0)
MCV: 87.8 fl (ref 78.0–100.0)
MONO ABS: 0.7 10*3/uL (ref 0.1–1.0)
Monocytes Relative: 6.8 % (ref 3.0–12.0)
NEUTROS PCT: 75 % (ref 43.0–77.0)
Neutro Abs: 7.5 10*3/uL (ref 1.4–7.7)
Platelets: 405 10*3/uL — ABNORMAL HIGH (ref 150.0–400.0)
RBC: 4.82 Mil/uL (ref 4.22–5.81)
RDW: 13.5 % (ref 11.5–15.5)
WBC: 10 10*3/uL (ref 4.0–10.5)

## 2016-02-26 LAB — COMPREHENSIVE METABOLIC PANEL
ALT: 51 U/L (ref 0–53)
AST: 24 U/L (ref 0–37)
Albumin: 4.6 g/dL (ref 3.5–5.2)
Alkaline Phosphatase: 109 U/L (ref 39–117)
BUN: 8 mg/dL (ref 6–23)
CHLORIDE: 98 meq/L (ref 96–112)
CO2: 31 mEq/L (ref 19–32)
Calcium: 9.8 mg/dL (ref 8.4–10.5)
Creatinine, Ser: 0.86 mg/dL (ref 0.40–1.50)
GFR: 127.08 mL/min (ref >60.00)
GLUCOSE: 300 mg/dL — AB (ref 70–99)
POTASSIUM: 3.9 meq/L (ref 3.5–5.1)
SODIUM: 136 meq/L (ref 135–145)
Total Bilirubin: 0.5 mg/dL (ref 0.2–1.2)
Total Protein: 7.6 g/dL (ref 6.0–8.3)

## 2016-02-26 NOTE — Progress Notes (Signed)
Review of pertinent gastrointestinal problems: 1. Minor intermittent rectal bleeding; Colonoscopy 2012 Dr. Ardis Hughs found left sided diverticulosis, mild inflammation (however biopsies all normal), TI was normal.  Probably was intermittent hemorrhoids. 2. Chronic Etoh abuse    HPI: This is a  very pleasant 40 year old man    who was referred to me by Debbrah Alar, NP  to evaluate  rectal bleeding, frequent stooling .    Chief complaint is resolved rectal bleeding, persistent frequent stooling  Vomiting 20 times in about 1 day;  He went to the ER; given IV fluids.  After that he noticed blood in his stools that lasted aboug 3 days.  Even before this, about a month of frequent stooling (solid pieces of stool, 10 time per days.  He's been losing weight 4-5 pounds recently.  No abd pains.  No etoh in 3 weeks: goes through phases. Will binge a few days at a times.  He explained these images as occurring 4-5 times per month. He will drink A999333 alcoholic drinks until he passes out. He has had DUI and is currently enrolled in abstinence program because of that.   Was taking tylenol, NSAIDs interimttently; thought he was having pancreatitis (whih he's    Bloodwork October 2017 shows AST 371, ALT 230, total bilirubin 2.0.  Repeat set 3-4 days later AST 45, ALT 97  CT scan 10/20/2017No acute abnormality.Atrophy and duct dilatation in the pancreatic body and pancreatic neck region. Findings most likely represent the sequelae of previous pancreatitis. No acute inflammatory changes around the pancreas. 1.6 cm lesion in the right hepatic lobe. This has probably been present since 2010 and suspect this represents a benign etiology, such as a cavernous hemangioma.  [place] 2014: fatty liver, normal GB without stones, normal bile ducts.   Review of systems: Pertinent positive and negative review of systems were noted in the above HPI section. Complete review of systems was performed and was otherwise  normal.   Past Medical History:  Diagnosis Date  . Alcoholic pancreatitis    recurrent  . Depression   . Diabetes mellitus, type II ([place])    New Onset 03/2010  . GERD (gastroesophageal reflux disease)   . High cholesterol   . 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 Hypertension   . Sleep apnea     Past Surgical History:  Procedure Laterality Date  . LUMBAR MICRODISCECTOMY  ~ 2004   Dr Ellene Route    Current Outpatient Prescriptions  Medication Sig Dispense Refill  . amLODipine (NORVASC) 10 MG tablet Take 1 tablet (10 mg total) by mouth daily. 90 tablet 1  . aspirin EC 81 MG tablet Take 1 tablet (81 mg total) by mouth daily. 90 tablet 1  . atorvastatin (LIPITOR) 20 MG tablet Take 1 tablet (20 mg total) by mouth daily. 90 tablet 1  . citalopram (CELEXA) 40 MG tablet Take 1 tablet (40 mg total) by mouth daily. 90 tablet 1  . Insulin Glargine (TOUJEO SOLOSTAR) 300 UNIT/ML SOPN Inject 30 Units into the skin daily. 10 mL 1  . lisinopril (PRINIVIL,ZESTRIL) 20 MG tablet TAKE 1 TABLET (20 MG TOTAL) BY MOUTH DAILY. 90 tablet 1  . Multiple Vitamins-Minerals (MULTIVITAMIN) tablet Take 1 tablet by mouth daily. 30 tablet 0  . pantoprazole (PROTONIX) 40 MG tablet Take 1 tablet (40 mg total) by mouth daily. 90 tablet 1  . potassium chloride (KLOR-CON M10) 10 MEQ tablet Take 2 tablets (20  mEq total) by mouth daily. 180 tablet 1  . tadalafil (CIALIS) 10 MG tablet Take 1 tablet (10 mg total) by mouth daily as needed for erectile dysfunction. 10 tablet 5  . thiamine 100 MG tablet Take 1 tablet (100 mg total) by mouth daily. 30 tablet 0  . ULTICARE SHORT PEN NEEDLES 31G X 8 MM MISC USE ONCE A DAY TO INJECT INSULIN 100 each 1  . valACYclovir (VALTREX) 500 MG tablet TAKE 1 TABLET (500 MG TOTAL) BY MOUTH DAILY. 90 tablet 1   No current facility-administered medications for this visit.     Allergies as of 02/26/2016 - Review Complete 02/26/2016  Allergen  Reaction Noted  . Invokamet [canagliflozin-metformin hcl] Other (See Comments) 11/18/2014  . Metformin and related  12/28/2014    Family History  Problem Relation Age of Onset  . Diabetes Mother   . Lung cancer Brother     twin brother  . Pancreatic cancer Paternal Aunt   . Colon cancer Neg Hx   . Stomach cancer Neg Hx     Social History   Social History  . Marital status: Married    Spouse name: N/A  . Number of children: 2  . Years of education: N/A   Occupational History  . 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 Smoking status: Never Smoker  . Smokeless tobacco: Never Used  . Alcohol use No     Comment: 3 weeks ago, last drink  . Drug use: No  . Sexual activity: Not on file   Other Topics Concern  . 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[place])   Married- 60 years (wife nurse at [employer] 35)   1 son  15   1 daughter - 7   Never Smoked    Alcohol use-no   1 Caffeine drink daily      Physical Exam: Ht 5' 10.87" (1.8 m) Comment: w/o shoes  Wt 215 lb 2 oz (97.6 kg)   BMI 30.12 kg/m  Constitutional: generally well-appearing Psychiatric: alert and oriented x3 Eyes: extraocular movements intact Mouth: oral pharynx moist, no lesions Neck: supple no lymphadenopathy Cardiovascular: heart regular rate and rhythm Lungs: clear to auscultation bilaterally Abdomen: soft, nontender, nondistended, no obvious ascites, no peritoneal signs, normal bowel sounds Extremities: no lower extremity edema bilaterally Skin: no lesions on visible extremities   Assessment and plan: 40 y.o. male with  Chronic alcohol abuse, frequent stooling, elevated liver tests, minor intermittent rectal bleeding  He has serious injuries of alcohol 4-5 times per month. He explains that he will drink A999333 alcoholic beverages until he passes out and then none for several days. I think this must be playing a role in  his elevated liver tests, intermittent loose stools, possibly his vomiting illness several days or weeks ago. Currently he has been sober for 3 weeks and I commended him on that. Recommended he continue this. I recommended Alcoholics Anonymous. He is currently enrolled in a DUI mandatory alcohol abstinence program. He says he is committed to never drink again. Hopefully that will be the case. I suspect his intermittent bleeding is anal rectal, minor. He had a colonoscopy 5 years ago no polyps were found. He has no family history of colon cancer. The bleeding stopped 1-2 weeks ago. Not recommending investigation of it right now. Michela Pitcher he is going to get basic set of labs including a CBC, complete metabolic  profile and coags. He will also ultrasound check for chronic liver disease. Lastly I recommended he take an Imodium once every morning for his frequent stooling he will return to see me in 6-8 weeks. Hopefully he will remain sober between now and then we may be a will get better handle on his GI issues at that point.   Owens Loffler, MD [facility] 02/26/2016, 9:12 AM  Cc: Debbrah Alar, NP

## 2016-02-26 NOTE — Patient Instructions (Addendum)
You will have labs checked today in the basement lab.  Please head down after you check out with the front desk  (cbc, cmet, inr). Start one imodium once daily every morning. Stay off the alcohol, congrats on the 3 weeks of sobriety so far. You will be set up for an ultrasound of RUQ for elevated liver tests.  You have been scheduled for an abdominal ultrasound at [facility] (1st floor of hospital) on 03/01/16 at 8 am. Please arrive 15 minutes prior to your appointment for registration. Make certain not to have anything to eat or drink 6 hours prior to your appointment. Should you need to reschedule your appointment, please contact radiology at [phone]. This test typically takes about 30 minutes to perform. Please return to see Dr. Ardis Hughs on 05/14/16 3:30 pm.

## 2016-02-27 ENCOUNTER — Other Ambulatory Visit: Payer: Self-pay | Admitting: Family

## 2016-02-27 MED FILL — CITALOPRAM HBR 40 MG TABLET: 40 | 90 days supply | Qty: 90 | Fill #1

## 2016-02-27 MED FILL — VALACYCLOVIR HCL 500 MG TAB: 500 | 90 days supply | Qty: 90 | Fill #1

## 2016-02-27 MED FILL — POTASSIUM CL 10 MEQ TAB SA: 10 | 90 days supply | Qty: 180 | Fill #1

## 2016-02-27 MED FILL — ATORVASTATIN 20 MG TABLET: 20 | 90 days supply | Qty: 90 | Fill #1

## 2016-02-27 MED FILL — PANTOPRAZOLE SOD DR 40 MG T: 40 | 90 days supply | Qty: 90 | Fill #1

## 2016-02-27 MED FILL — AMLODIPINE BESYLATE 10 MG T: 10 | 90 days supply | Qty: 90 | Fill #1

## 2016-02-27 MED FILL — LISINOPRIL 20 MG TABLET: 20 | 90 days supply | Qty: 90 | Fill #1

## 2016-02-28 MED FILL — ZOLPIDEM TARTRATE 5 MG TAB: 5 | 30 days supply | Qty: 30 | Fill #0

## 2016-02-28 NOTE — Progress Notes (Signed)
Rx faxed to the Pharmacy on 02/28/16.

## 2016-02-28 NOTE — Telephone Encounter (Signed)
Please advise. TL/CMA 

## 2016-03-01 ENCOUNTER — Ambulatory Visit (HOSPITAL_COMMUNITY): Admission: RE | Admit: 2016-03-01 | Payer: 59 | Source: Ambulatory Visit

## 2016-04-01 ENCOUNTER — Other Ambulatory Visit: Payer: Self-pay | Admitting: Family

## 2016-04-01 ENCOUNTER — Encounter: Payer: Self-pay | Admitting: Family

## 2016-04-01 NOTE — Telephone Encounter (Signed)
Refill sent per [facility] refill protocol/SLS  

## 2016-04-02 ENCOUNTER — Encounter: Payer: Self-pay | Admitting: Family

## 2016-04-09 ENCOUNTER — Other Ambulatory Visit: Payer: Self-pay | Admitting: Family

## 2016-04-09 MED FILL — TOUJEO SOLOSTAR 300 UNITS/M: 300 | 30 days supply | Qty: 3 | Fill #0

## 2016-04-09 MED FILL — ULTICARE PEN NDL 8MM 31G: 31G X 8 MM | 90 days supply | Qty: 100 | Fill #0

## 2016-04-12 ENCOUNTER — Encounter (HOSPITAL_BASED_OUTPATIENT_CLINIC_OR_DEPARTMENT_OTHER): Payer: Self-pay | Admitting: *Deleted

## 2016-04-12 ENCOUNTER — Emergency Department (HOSPITAL_BASED_OUTPATIENT_CLINIC_OR_DEPARTMENT_OTHER)
Admission: EM | Admit: 2016-04-12 | Discharge: 2016-04-13 | Disposition: A | Discharge status: Home / Self Care | Payer: 59 | Attending: Emergency Medicine | Admitting: Emergency Medicine

## 2016-04-12 DIAGNOSIS — R112 Nausea with vomiting, unspecified: Secondary | ICD-10-CM | POA: Insufficient documentation

## 2016-04-12 DIAGNOSIS — R1013 Epigastric pain: Secondary | ICD-10-CM | POA: Insufficient documentation

## 2016-04-12 DIAGNOSIS — I10 Essential (primary) hypertension: Secondary | ICD-10-CM | POA: Insufficient documentation

## 2016-04-12 DIAGNOSIS — Z7982 Long term (current) use of aspirin: Secondary | ICD-10-CM | POA: Insufficient documentation

## 2016-04-12 DIAGNOSIS — E119 Type 2 diabetes mellitus without complications: Secondary | ICD-10-CM | POA: Insufficient documentation

## 2016-04-12 LAB — CBC WITH DIFFERENTIAL/PLATELET
Basophils Absolute: 0.1 10*3/uL (ref 0.0–0.1)
Basophils Relative: 1 %
EOS ABS: 0 10*3/uL (ref 0.0–0.7)
EOS PCT: 0 %
HCT: 40 % (ref 39.0–52.0)
Hemoglobin: 14.3 g/dL (ref 13.0–17.0)
LYMPHS ABS: 0.9 10*3/uL (ref 0.7–4.0)
LYMPHS PCT: 7 %
MCH: 29.7 pg (ref 26.0–34.0)
MCHC: 35.8 g/dL (ref 30.0–36.0)
MCV: 83 fL (ref 78.0–100.0)
MONO ABS: 0.8 10*3/uL (ref 0.1–1.0)
MONOS PCT: 6 %
Neutro Abs: 11.5 10*3/uL — ABNORMAL HIGH (ref 1.7–7.7)
Neutrophils Relative %: 86 %
PLATELETS: 375 10*3/uL (ref 150–400)
RBC: 4.82 MIL/uL (ref 4.22–5.81)
RDW: 12.5 % (ref 11.5–15.5)
WBC: 13.3 10*3/uL — AB (ref 4.0–10.5)

## 2016-04-12 LAB — CBG MONITORING, ED: Glucose-Capillary: 277 mg/dL — ABNORMAL HIGH (ref 65–99)

## 2016-04-12 MED ORDER — SODIUM CHLORIDE 0.9 % IV BOLUS (SEPSIS)
1000.0000 mL | Freq: Once | INTRAVENOUS | Status: AC
  Administered 2016-04-12: 1000 mL via INTRAVENOUS

## 2016-04-12 MED ORDER — ONDANSETRON HCL 4 MG/2ML IJ SOLN
4.0000 mg | Freq: Once | INTRAMUSCULAR | Status: AC
  Administered 2016-04-12: 4 mg via INTRAVENOUS
  Filled 2016-04-12: qty 2

## 2016-04-12 MED ORDER — FAMOTIDINE IN NACL 20-0.9 MG/50ML-% IV SOLN
20.0000 mg | INTRAVENOUS | Status: AC
  Administered 2016-04-12: 20 mg via INTRAVENOUS
  Filled 2016-04-12: qty 50

## 2016-04-12 NOTE — ED Triage Notes (Signed)
Vomiting since yesterday. Epigastric pain unrelieved by tums.

## 2016-04-12 NOTE — ED Provider Notes (Signed)
[facility] DEPT [facility] Provider Note   CSN: [id number] Arrival date & time: 04/12/16  2249  By signing my name below, I, Emmanuella Mensah, attest that this documentation has been prepared under the direction and in the presence of Quincy Carnes, PA-C. Electronically Signed: Judithann Sauger, ED Scribe. 04/12/16. 11:15 PM.   History   Chief Complaint Chief Complaint  Patient presents with  . Emesis    HPI Comments: Willie [name] is a 40 y.o. male with a hx of alcoholic pancreatitis, GERD, DM, and hypertension who presents to the Emergency Department complaining of gradually worsening moderate epigastric/lower chest pain he describes as burning onset 2 days ago. He reports associated multiple episodes of non-bloody vomiting and persistent belching. He notes that his symptoms began after drinking ETOH heavily 2 days ago. No alleviating factors noted. He states that he tried Tums PTA with no relief and he did not take his DM medications PTA. He reports an allergy to Invokamet and Metformin. Pt takes Protonix for his hx of GERD. No hx of abdominal surgeries.  Reports current symptoms feel different than his prior bouts of pancreatitis.  Denies any SOB, cough, sore throat, or URI symptoms.    The history is provided by the patient. No language interpreter was used.    Past Medical History:  Diagnosis Date  . Alcoholic pancreatitis    recurrent  . Depression   . Diabetes mellitus, type II ([place])    New Onset 03/2010  . GERD (gastroesophageal reflux disease)   . High cholesterol   . 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 Hypertension   . Sleep apnea     Patient Active Problem List   Diagnosis Date Noted  . Alcohol abuse with unspecified alcohol-induced disorder ([place]) 12/10/2015  . Anxiety state 11/20/2015  . Dental infection 07/10/2015  . Mood disorder ([place]) 04/18/2015  . Alcohol use disorder, severe, dependence ([place])  12/29/2014  . Eczema 06/27/2014  . GERD (gastroesophageal reflux disease) 05/24/2014  . Snoring 04/07/2013  . Suicide attempt by substance overdose ([place]) 02/10/2013  . Alcohol abuse 12/31/2012  . Erectile dysfunction 12/09/2012  . Hematuria 06/11/2012  . Vision problem 02/02/2012  . Depression with anxiety 11/13/2010  . Hyperlipemia 06/27/2010  . Diabetes type 2, uncontrolled ([place]) 05/22/2010  . Essential hypertension 05/22/2010    Past Surgical History:  Procedure Laterality Date  . LUMBAR MICRODISCECTOMY  ~ 2004   Dr Ellene Route       Home Medications    Prior to Admission medications   Medication Sig Start Date End Date Taking? Authorizing Provider  amLODipine (NORVASC) 10 MG tablet Take 1 tablet (10 mg total) by mouth daily. 11/20/15   Debbrah Alar, NP  aspirin EC 81 MG tablet Take 1 tablet (81 mg total) by mouth daily. 10/23/15   Debbrah Alar, NP  atorvastatin (LIPITOR) 20 MG tablet Take 1 tablet (20 mg total) by mouth daily. 11/20/15   Debbrah Alar, NP  citalopram (CELEXA) 40 MG tablet Take 1 tablet (40 mg total) by mouth daily. 11/20/15   Debbrah Alar, NP  lisinopril (PRINIVIL,ZESTRIL) 20 MG tablet TAKE 1 TABLET (20 MG TOTAL) BY MOUTH DAILY. 11/20/15   Debbrah Alar, NP  Multiple Vitamins-Minerals (MULTIVITAMIN) tablet Take 1 tablet by mouth daily. 02/12/16   April Palumbo, MD  pantoprazole (PROTONIX) 40 MG tablet Take 1 tablet (40 mg total) by mouth daily. 11/20/15   Debbrah Alar, NP  potassium chloride (KLOR-CON  M10) 10 MEQ tablet Take 2 tablets (20 mEq total) by mouth daily. 11/20/15   Debbrah Alar, NP  tadalafil (CIALIS) 10 MG tablet Take 1 tablet (10 mg total) by mouth daily as needed for erectile dysfunction. 11/09/14   Debbrah Alar, NP  thiamine 100 MG tablet Take 1 tablet (100 mg total) by mouth daily. 02/12/16   April Palumbo, MD  TOUJEO SOLOSTAR 300 UNIT/ML SOPN INJECT 30 UNITS INTO THE SKIN DAILY 04/01/16   Debbrah Alar,  NP  ULTICARE SHORT PEN NEEDLES 31G X 8 MM MISC USE ONCE A DAY TO INJECT INSULIN 04/09/16   Debbrah Alar, NP  valACYclovir (VALTREX) 500 MG tablet TAKE 1 TABLET (500 MG TOTAL) BY MOUTH DAILY. 11/20/15   Debbrah Alar, NP  zolpidem (AMBIEN) 5 MG tablet TAKE 1 TABLET BY MOUTH AT BEDTIME AS NEEDED FOR SLEEP 02/28/16   Debbrah Alar, NP    Family History Family History  Problem Relation Age of Onset  . Diabetes Mother   . Lung cancer Brother     twin brother  . Pancreatic cancer Paternal Aunt   . Colon cancer Neg Hx   . Stomach cancer Neg Hx     Social History Social History  Substance Use Topics  . Smoking status: Never Smoker  . Smokeless tobacco: Never Used  . Alcohol use No     Comment: 3 weeks ago, last drink     Allergies   Invokamet [canagliflozin-metformin hcl] and Metformin and related   Review of Systems Review of Systems  Constitutional: Negative for chills and fever.  Gastrointestinal: Positive for abdominal pain, nausea and vomiting. Negative for diarrhea.  All other systems reviewed and are negative.    Physical Exam Updated Vital Signs BP (!) 163/113   Pulse (!) 126   Temp 98.4 F (36.9 C) (Oral)   Resp 20   Ht 6' (1.829 m)   Wt 215 lb (97.5 kg)   SpO2 99%   BMI 29.16 kg/m   Physical Exam  Constitutional: He is oriented to person, place, and time. He appears well-developed and well-nourished.  Belching during exam  HENT:  Head: Normocephalic and atraumatic.  Mouth/Throat: Oropharynx is clear and moist.  Dry mucous membranes  Eyes: Conjunctivae and EOM are normal. Pupils are equal, round, and reactive to light.  Neck: Normal range of motion.  Cardiovascular: Normal rate, regular rhythm and normal heart sounds.   Pulmonary/Chest: Effort normal and breath sounds normal. No respiratory distress. He has no wheezes. He has no rhonchi.  No chest wall tenderness, no deformities, lungs clear  Abdominal: Soft. Bowel sounds are normal.  There is no tenderness. There is no rebound.  Reports burning pain in epigastrium and lower chest; Abdomen soft, non-tender, no distention, no peritoneal signs  Musculoskeletal: Normal range of motion.  Neurological: He is alert and oriented to person, place, and time.  Skin: Skin is warm and dry.  Psychiatric: He has a normal mood and affect.  Nursing note and vitals reviewed.    ED Treatments / Results  DIAGNOSTIC STUDIES: Oxygen Saturation is 99% on RA, normal by my interpretation.    COORDINATION OF CARE: 11:12 PM- Pt advised of plan for treatment and pt agrees. Pt will receive lab work and EKG for further evaluation. He will also receive IV fluids and Zofran.    Labs (all labs ordered are listed, but only abnormal results are displayed) Labs Reviewed  CBC WITH DIFFERENTIAL/PLATELET - Abnormal; Notable for the following:  Result Value   WBC 13.3 (*)    Neutro Abs 11.5 (*)    All other components within normal limits  COMPREHENSIVE METABOLIC PANEL - Abnormal; Notable for the following:    Sodium 132 (*)    Chloride 90 (*)    Glucose, Bld 266 (*)    AST 46 (*)    Total Bilirubin 2.4 (*)    All other components within normal limits  LIPASE, BLOOD - Abnormal; Notable for the following:    Lipase <10 (*)    All other components within normal limits  CBG MONITORING, ED - Abnormal; Notable for the following:    Glucose-Capillary 277 (*)    All other components within normal limits  TROPONIN I  URINALYSIS, ROUTINE W REFLEX MICROSCOPIC    EKG  EKG Interpretation  Date/Time:  Friday April 12 2016 23:07:01 EST Ventricular Rate:  115 PR Interval:    QRS Duration: 94 QT Interval:  330 QTC Calculation: 457 R Axis:   80 Text Interpretation:  Sinus tachycardia Borderline T abnormalities, inferior leads Borderline ST elevation, lateral leads Confirmed by DELO  MD, DOUGLAS ([id number]) on 04/12/2016 11:16:30 PM       Radiology No results found.  Procedures Procedures  (including critical care time)  Medications Ordered in ED Medications - No data to display   Initial Impression / Assessment and Plan / ED Course  Quincy Carnes, PA-C  has reviewed the triage vital signs and the nursing notes.  Pertinent labs & imaging results that were available during my care of the patient were reviewed by me and considered in my medical decision making (see chart for details).  Clinical Course    40 year old male here with epigastric and lower chest wall pain for the past 2 days. Described as burning sensation. Began after heavy drinking.  He is afebrile and nontoxic. He is tachycardic on initial exam which I suspect is from dehydration. He does appear clinically dry on exam.  His abdomen is soft and benign. Reports burning pain in his epigastrium and lower chest. He is belching intermittently during exam. Suspect this may be GERD related.  Will plan for labs, IVF, pepcid, zofran.  CBG is 277-- has not had his insulin today due to symptoms.  Lab work overall reassuring.  Mild leukocytosis noted which may be reactive from vomiting.  AST elevated at 46, not necessarily abnormal for patient when compared with prior values (may be from EtOH).  Lipase WNL.  Trop negative.  EKG non-ischemic.  UA without signs of infection, large amount of ketones (>80).  Lab work not consistent with DKA.  On re-check, still complaining of burning pain, belching somewhat better.  Abdomen remains soft, benign.  Tachycardia has improved with fluids.  Additional liter ordered given ketones in urine.  1:02 AM GI cocktail just given.  Additional IVF infusing.  Anticipate discharge once symptoms better controlled.  Care signed out to night provider at this time.  Final Clinical Impressions(s) / ED Diagnoses   Final diagnoses:  Nausea and vomiting, intractability of vomiting not specified, unspecified vomiting type    New Prescriptions New Prescriptions   ONDANSETRON (ZOFRAN ODT) 4 MG DISINTEGRATING  TABLET    Take 1 tablet (4 mg total) by mouth every 8 (eight) hours as needed for nausea.   I personally performed the services described in this documentation, which was scribed in my presence. The recorded information has been reviewed and is accurate.   Larene Pickett, PA-C 04/13/16 [phone]  Veryl Speak, MD 04/13/16 [phone]

## 2016-04-13 DIAGNOSIS — Z7982 Long term (current) use of aspirin: Secondary | ICD-10-CM | POA: Diagnosis not present

## 2016-04-13 DIAGNOSIS — E119 Type 2 diabetes mellitus without complications: Secondary | ICD-10-CM | POA: Diagnosis not present

## 2016-04-13 DIAGNOSIS — R1013 Epigastric pain: Secondary | ICD-10-CM | POA: Diagnosis not present

## 2016-04-13 DIAGNOSIS — R112 Nausea with vomiting, unspecified: Secondary | ICD-10-CM | POA: Diagnosis not present

## 2016-04-13 DIAGNOSIS — I10 Essential (primary) hypertension: Secondary | ICD-10-CM | POA: Diagnosis not present

## 2016-04-13 LAB — URINALYSIS, MICROSCOPIC (REFLEX): Bacteria, UA: NONE SEEN

## 2016-04-13 LAB — URINALYSIS, ROUTINE W REFLEX MICROSCOPIC
BILIRUBIN URINE: NEGATIVE
Glucose, UA: >=500 mg/dL — AB
Hgb urine dipstick: NEGATIVE
LEUKOCYTES UA: NEGATIVE
NITRITE: NEGATIVE
PH: 7 (ref 5.0–8.0)
PROTEIN: 30 mg/dL — AB
Specific Gravity, Urine: 1.046 — ABNORMAL HIGH (ref 1.005–1.030)

## 2016-04-13 LAB — COMPREHENSIVE METABOLIC PANEL
ALT: 45 U/L (ref 17–63)
ANION GAP: 15 (ref 5–15)
AST: 46 U/L — ABNORMAL HIGH (ref 15–41)
Albumin: 4.4 g/dL (ref 3.5–5.0)
Alkaline Phosphatase: 91 U/L (ref 38–126)
BILIRUBIN TOTAL: 2.4 mg/dL — AB (ref 0.3–1.2)
BUN: 10 mg/dL (ref 6–20)
CHLORIDE: 90 mmol/L — AB (ref 101–111)
CO2: 27 mmol/L (ref 22–32)
Calcium: 9.8 mg/dL (ref 8.9–10.3)
Creatinine, Ser: 0.77 mg/dL (ref 0.61–1.24)
Glucose, Bld: 266 mg/dL — ABNORMAL HIGH (ref 65–99)
POTASSIUM: 3.7 mmol/L (ref 3.5–5.1)
Sodium: 132 mmol/L — ABNORMAL LOW (ref 135–145)
TOTAL PROTEIN: 7.8 g/dL (ref 6.5–8.1)

## 2016-04-13 LAB — LIPASE, BLOOD

## 2016-04-13 LAB — TROPONIN I

## 2016-04-13 MED ORDER — SODIUM CHLORIDE 0.9 % IV BOLUS (SEPSIS)
1000.0000 mL | Freq: Once | INTRAVENOUS | Status: AC
  Administered 2016-04-13: 1000 mL via INTRAVENOUS

## 2016-04-13 MED ORDER — GI COCKTAIL ~~LOC~~
30.0000 mL | Freq: Once | ORAL | Status: AC
  Administered 2016-04-13: 30 mL via ORAL
  Filled 2016-04-13: qty 30

## 2016-04-13 MED ORDER — ONDANSETRON 4 MG PO TBDP: 4.0000 mg | ORAL_TABLET | Freq: Three times a day (TID) | ORAL | 0 refills | Status: DC | PRN

## 2016-04-13 NOTE — Discharge Instructions (Signed)
Take the prescribed medication as directed for nausea.  Continue your protonix.  Can add TUMS, maalox, rolaids, etc when needed. Recommend to abstain from alcohol for the next few days. Follow-up with your primary care doctor. Return to the ED for new or worsening symptoms.

## 2016-04-25 DIAGNOSIS — F331 Major depressive disorder, recurrent, moderate: Secondary | ICD-10-CM | POA: Diagnosis not present

## 2016-04-25 DIAGNOSIS — E119 Type 2 diabetes mellitus without complications: Secondary | ICD-10-CM | POA: Diagnosis not present

## 2016-04-25 DIAGNOSIS — F1920 Other psychoactive substance dependence, uncomplicated: Secondary | ICD-10-CM | POA: Diagnosis not present

## 2016-04-25 DIAGNOSIS — F411 Generalized anxiety disorder: Secondary | ICD-10-CM | POA: Diagnosis not present

## 2016-04-25 DIAGNOSIS — F209 Schizophrenia, unspecified: Secondary | ICD-10-CM | POA: Diagnosis not present

## 2016-04-25 DIAGNOSIS — F1020 Alcohol dependence, uncomplicated: Secondary | ICD-10-CM | POA: Diagnosis not present

## 2016-04-25 DIAGNOSIS — I10 Essential (primary) hypertension: Secondary | ICD-10-CM | POA: Diagnosis not present

## 2016-04-25 DIAGNOSIS — Z111 Encounter for screening for respiratory tuberculosis: Secondary | ICD-10-CM | POA: Diagnosis not present

## 2016-04-25 DIAGNOSIS — F319 Bipolar disorder, unspecified: Secondary | ICD-10-CM | POA: Diagnosis not present

## 2016-04-25 DIAGNOSIS — F339 Major depressive disorder, recurrent, unspecified: Secondary | ICD-10-CM | POA: Diagnosis not present

## 2016-04-26 ENCOUNTER — Telehealth: Payer: Self-pay | Admitting: Family

## 2016-04-26 DIAGNOSIS — F331 Major depressive disorder, recurrent, moderate: Secondary | ICD-10-CM | POA: Diagnosis not present

## 2016-04-26 DIAGNOSIS — F1020 Alcohol dependence, uncomplicated: Secondary | ICD-10-CM | POA: Diagnosis not present

## 2016-04-26 DIAGNOSIS — F411 Generalized anxiety disorder: Secondary | ICD-10-CM | POA: Diagnosis not present

## 2016-04-26 DIAGNOSIS — F319 Bipolar disorder, unspecified: Secondary | ICD-10-CM | POA: Diagnosis not present

## 2016-04-26 DIAGNOSIS — E119 Type 2 diabetes mellitus without complications: Secondary | ICD-10-CM | POA: Diagnosis not present

## 2016-04-26 DIAGNOSIS — I10 Essential (primary) hypertension: Secondary | ICD-10-CM | POA: Diagnosis not present

## 2016-04-26 DIAGNOSIS — F209 Schizophrenia, unspecified: Secondary | ICD-10-CM | POA: Diagnosis not present

## 2016-04-26 NOTE — Telephone Encounter (Signed)
Caller name: Loma Sousa Relationship to patient:[facility] Can be reached: [phone] Pharmacy:  Reason for call: [facility] is in [facility] and they are trying to determine the amount of insulin that patient is suppose to be getting daily. ([name]'s patient)

## 2016-04-26 NOTE — Telephone Encounter (Signed)
Called Rehab [facility] with medication dosage for Insulin.

## 2016-04-29 DIAGNOSIS — F1020 Alcohol dependence, uncomplicated: Secondary | ICD-10-CM | POA: Diagnosis not present

## 2016-04-29 DIAGNOSIS — F411 Generalized anxiety disorder: Secondary | ICD-10-CM | POA: Diagnosis not present

## 2016-04-29 DIAGNOSIS — F331 Major depressive disorder, recurrent, moderate: Secondary | ICD-10-CM | POA: Diagnosis not present

## 2016-04-29 DIAGNOSIS — F209 Schizophrenia, unspecified: Secondary | ICD-10-CM | POA: Diagnosis not present

## 2016-04-29 DIAGNOSIS — E119 Type 2 diabetes mellitus without complications: Secondary | ICD-10-CM | POA: Diagnosis not present

## 2016-04-29 DIAGNOSIS — I10 Essential (primary) hypertension: Secondary | ICD-10-CM | POA: Diagnosis not present

## 2016-04-29 DIAGNOSIS — F319 Bipolar disorder, unspecified: Secondary | ICD-10-CM | POA: Diagnosis not present

## 2016-05-03 DIAGNOSIS — F319 Bipolar disorder, unspecified: Secondary | ICD-10-CM | POA: Diagnosis not present

## 2016-05-03 DIAGNOSIS — F1020 Alcohol dependence, uncomplicated: Secondary | ICD-10-CM | POA: Diagnosis not present

## 2016-05-03 DIAGNOSIS — E119 Type 2 diabetes mellitus without complications: Secondary | ICD-10-CM | POA: Diagnosis not present

## 2016-05-03 DIAGNOSIS — F411 Generalized anxiety disorder: Secondary | ICD-10-CM | POA: Diagnosis not present

## 2016-05-03 DIAGNOSIS — F209 Schizophrenia, unspecified: Secondary | ICD-10-CM | POA: Diagnosis not present

## 2016-05-03 DIAGNOSIS — I10 Essential (primary) hypertension: Secondary | ICD-10-CM | POA: Diagnosis not present

## 2016-05-03 DIAGNOSIS — F331 Major depressive disorder, recurrent, moderate: Secondary | ICD-10-CM | POA: Diagnosis not present

## 2016-05-06 DIAGNOSIS — F1020 Alcohol dependence, uncomplicated: Secondary | ICD-10-CM | POA: Diagnosis not present

## 2016-05-06 DIAGNOSIS — F319 Bipolar disorder, unspecified: Secondary | ICD-10-CM | POA: Diagnosis not present

## 2016-05-06 DIAGNOSIS — E119 Type 2 diabetes mellitus without complications: Secondary | ICD-10-CM | POA: Diagnosis not present

## 2016-05-06 DIAGNOSIS — I10 Essential (primary) hypertension: Secondary | ICD-10-CM | POA: Diagnosis not present

## 2016-05-10 DIAGNOSIS — I10 Essential (primary) hypertension: Secondary | ICD-10-CM | POA: Diagnosis not present

## 2016-05-10 DIAGNOSIS — F319 Bipolar disorder, unspecified: Secondary | ICD-10-CM | POA: Diagnosis not present

## 2016-05-10 DIAGNOSIS — E119 Type 2 diabetes mellitus without complications: Secondary | ICD-10-CM | POA: Diagnosis not present

## 2016-05-10 DIAGNOSIS — F1020 Alcohol dependence, uncomplicated: Secondary | ICD-10-CM | POA: Diagnosis not present

## 2016-05-13 DIAGNOSIS — F411 Generalized anxiety disorder: Secondary | ICD-10-CM | POA: Diagnosis not present

## 2016-05-13 DIAGNOSIS — F1020 Alcohol dependence, uncomplicated: Secondary | ICD-10-CM | POA: Diagnosis not present

## 2016-05-13 DIAGNOSIS — I10 Essential (primary) hypertension: Secondary | ICD-10-CM | POA: Diagnosis not present

## 2016-05-13 DIAGNOSIS — E119 Type 2 diabetes mellitus without complications: Secondary | ICD-10-CM | POA: Diagnosis not present

## 2016-05-13 DIAGNOSIS — F339 Major depressive disorder, recurrent, unspecified: Secondary | ICD-10-CM | POA: Diagnosis not present

## 2016-05-14 ENCOUNTER — Ambulatory Visit: Payer: Self-pay | Admitting: Gastroenterology

## 2016-05-15 DIAGNOSIS — F339 Major depressive disorder, recurrent, unspecified: Secondary | ICD-10-CM | POA: Diagnosis not present

## 2016-05-15 DIAGNOSIS — I10 Essential (primary) hypertension: Secondary | ICD-10-CM | POA: Diagnosis not present

## 2016-05-15 DIAGNOSIS — F411 Generalized anxiety disorder: Secondary | ICD-10-CM | POA: Diagnosis not present

## 2016-05-15 DIAGNOSIS — E119 Type 2 diabetes mellitus without complications: Secondary | ICD-10-CM | POA: Diagnosis not present

## 2016-05-15 DIAGNOSIS — F1020 Alcohol dependence, uncomplicated: Secondary | ICD-10-CM | POA: Diagnosis not present

## 2016-05-16 DIAGNOSIS — F1020 Alcohol dependence, uncomplicated: Secondary | ICD-10-CM | POA: Diagnosis not present

## 2016-05-17 DIAGNOSIS — F1020 Alcohol dependence, uncomplicated: Secondary | ICD-10-CM | POA: Diagnosis not present

## 2016-05-18 DIAGNOSIS — F1020 Alcohol dependence, uncomplicated: Secondary | ICD-10-CM | POA: Diagnosis not present

## 2016-05-20 DIAGNOSIS — F1020 Alcohol dependence, uncomplicated: Secondary | ICD-10-CM | POA: Diagnosis not present

## 2016-05-21 DIAGNOSIS — F1020 Alcohol dependence, uncomplicated: Secondary | ICD-10-CM | POA: Diagnosis not present

## 2016-05-22 DIAGNOSIS — F1020 Alcohol dependence, uncomplicated: Secondary | ICD-10-CM | POA: Diagnosis not present

## 2016-05-23 DIAGNOSIS — F1020 Alcohol dependence, uncomplicated: Secondary | ICD-10-CM | POA: Diagnosis not present

## 2016-05-24 DIAGNOSIS — F1020 Alcohol dependence, uncomplicated: Secondary | ICD-10-CM | POA: Diagnosis not present

## 2016-05-25 DIAGNOSIS — F1020 Alcohol dependence, uncomplicated: Secondary | ICD-10-CM | POA: Diagnosis not present

## 2016-05-27 DIAGNOSIS — F1020 Alcohol dependence, uncomplicated: Secondary | ICD-10-CM | POA: Diagnosis not present

## 2016-05-28 ENCOUNTER — Telehealth: Payer: Self-pay | Admitting: Family

## 2016-05-28 NOTE — Telephone Encounter (Signed)
Patient was just released from Rehab for alcohol addiction. He is wanting to see if you can work him in before Friday because he has a new medication they gave him to help curve the cravings. He ask is it ok for him to see someone else if you cannot work him in.   Please advise

## 2016-05-29 MED FILL — ESCITALOPRAM 20 MG TABLET: 20 | 30 days supply | Qty: 30 | Fill #0

## 2016-05-29 NOTE — Telephone Encounter (Signed)
Left patient a message to call office back to schedule with another provider

## 2016-05-29 NOTE — Telephone Encounter (Signed)
I don't have any openings this week. It is ok for him to see another provider.

## 2016-05-30 ENCOUNTER — Ambulatory Visit (INDEPENDENT_AMBULATORY_CARE_PROVIDER_SITE_OTHER): Payer: 59 | Admitting: Medical

## 2016-05-30 ENCOUNTER — Encounter: Payer: Self-pay | Admitting: Medical

## 2016-05-30 VITALS — BP 130/80 | HR 99 | Temp 98.2°F | Ht 70.0 in | Wt 218.2 lb

## 2016-05-30 DIAGNOSIS — F1011 Alcohol abuse, in remission: Secondary | ICD-10-CM | POA: Diagnosis not present

## 2016-05-30 DIAGNOSIS — Z87898 Personal history of other specified conditions: Secondary | ICD-10-CM | POA: Diagnosis not present

## 2016-05-30 MED FILL — VIVITROL 380 MG VIAL + DILU: 380 | 1 days supply | Qty: 1 | Fill #0

## 2016-05-30 NOTE — Patient Instructions (Signed)
For your alcohol abuse recovery, I want you to take all medications rehab facility gave you. I talked with [facility] Pharmacist down stairs and vivitrol is available down stairs and covered.   Please go downstairs and get med now. I will in mean time try to arrange for one of RN to give you the injection or if wife who is RN can give.(Discuss that with pharmacist)  Follow up in 2 weeks with myself or as needed.

## 2016-05-30 NOTE — Progress Notes (Signed)
Subjective:    Patient ID: Willie [name], male    DOB: 12/17/1975, 41 y.o.   MRN: [id number]  HPI  Pt in for follow up. He just finished rehab for alcohol abuse. Pt states he was in [place] and he went to rehab. Pt states he completed the program. They wanted him to stay longer but he had to leave due to work obligations. The rehab facility was trying to discharge him on injection that would help him not drink alcohol. Pt has not drank etoh since admitted to facility. He states doing well.  Pt was discharged on seroquel , thiamine, vistaril, clondine and thiamine.   Pt is on clonidine 0.1mg  bid, vistaril, 25 mg tid, seroquel 300 mg q hs, and thiamine 100 mg a day. Also mood stabilizer.  Pt has been home now for 3 days.   Pt has no curbs/craving. Pt works at [employer]. 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 He sometimes has [employer] when short staffed or overwhelmed.   [facility].  Pt states that he was told would be on naltrexone. Told help to reduce pleasure and cravings.  Once monthly injectable form. Per pt monthly form.   Pt states he was told once prior authorization for once month injection of naltrexone would need to be restarted once he left rehab in [place].  Discussed with vivitrol rx active and is fully covered.    No hx of opiod use per pt.   Review of Systems  Constitutional: Negative for chills, fatigue and fever.  Respiratory: Negative for cough, chest tightness, shortness of breath and wheezing.   Cardiovascular: Negative for chest pain and palpitations.  Gastrointestinal: Negative for abdominal pain.  Musculoskeletal: Negative for back pain.  Neurological: Negative for dizziness, syncope, speech difficulty, weakness and light-headedness.  Hematological: Negative for adenopathy. Does not bruise/bleed easily.  Psychiatric/Behavioral: Negative for behavioral problems, confusion and suicidal ideas. The patient is not nervous/anxious.     Past Medical History:   Diagnosis Date  . Alcoholic pancreatitis    recurrent  . Depression   . Diabetes mellitus, type II ([place])    New Onset 03/2010  . GERD (gastroesophageal reflux disease)   . High cholesterol   . 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 Hypertension   . Sleep apnea      Social History   Social History  . Marital status: Married    Spouse name: N/A  . Number of children: 2  . Years of education: N/A   Occupational History  . 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 Smoking status: Never Smoker  . Smokeless tobacco: Never Used  . Alcohol use No     Comment: 3 weeks ago, last drink  . Drug use: No  . Sexual activity: Not on file   Other Topics Concern  . 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 LUMBAR MICRODISCECTOMY  ~ 2004   Dr Ellene Route    Family History  Problem Relation Age of Onset  . Diabetes Mother   .  Lung cancer Brother     twin brother  . Pancreatic cancer Paternal Aunt   . Colon cancer Neg Hx   . Stomach cancer Neg Hx     Allergies  Allergen Reactions  . Invokamet [Canagliflozin-Metformin Hcl] Other (See Comments)    Lactic acidosis  . Metformin And Related     Drastic drop in blood sugar    Current Outpatient Prescriptions on File Prior to Visit  Medication Sig Dispense Refill  . amLODipine (NORVASC) 10 MG tablet Take 1 tablet (10 mg total) by mouth daily. 90 tablet 1  . aspirin EC 81 MG tablet Take 1 tablet (81 mg total) by mouth daily. 90 tablet 1  . atorvastatin (LIPITOR) 20 MG tablet Take 1 tablet (20 mg total) by mouth daily. 90 tablet 1  . citalopram (CELEXA) 40 MG tablet Take 1 tablet (40 mg total) by mouth daily.  90 tablet 1  . lisinopril (PRINIVIL,ZESTRIL) 20 MG tablet TAKE 1 TABLET (20 MG TOTAL) BY MOUTH DAILY. 90 tablet 1  . Multiple Vitamins-Minerals (MULTIVITAMIN) tablet Take 1 tablet by mouth daily. 30 tablet 0  . ondansetron (ZOFRAN ODT) 4 MG disintegrating tablet Take 1 tablet (4 mg total) by mouth every 8 (eight) hours as needed for nausea. 10 tablet 0  . pantoprazole (PROTONIX) 40 MG tablet Take 1 tablet (40 mg total) by mouth daily. 90 tablet 1  . potassium chloride (KLOR-CON M10) 10 MEQ tablet Take 2 tablets (20 mEq total) by mouth daily. 180 tablet 1  . tadalafil (CIALIS) 10 MG tablet Take 1 tablet (10 mg total) by mouth daily as needed for erectile dysfunction. 10 tablet 5  . thiamine 100 MG tablet Take 1 tablet (100 mg total) by mouth daily. 30 tablet 0  . ULTICARE SHORT PEN NEEDLES 31G X 8 MM MISC USE ONCE A DAY TO INJECT INSULIN 100 each 0  . valACYclovir (VALTREX) 500 MG tablet TAKE 1 TABLET (500 MG TOTAL) BY MOUTH DAILY. 90 tablet 1  . zolpidem (AMBIEN) 5 MG tablet TAKE 1 TABLET BY MOUTH AT BEDTIME AS NEEDED FOR SLEEP 30 tablet 0  . TOUJEO SOLOSTAR 300 UNIT/ML SOPN INJECT 30 UNITS INTO THE SKIN DAILY 4.5 mL 1   No current facility-administered medications on file prior to visit.     BP 130/80   Pulse 99   Temp 98.2 F (36.8 C) (Oral)   Ht 5\' 10"  (1.778 m)   Wt 218 lb 3.2 oz (99 kg)   SpO2 98%   BMI 31.31 kg/m       Objective:   Physical Exam  General Mental Status- Alert. General Appearance- Not in acute distress.   Skin General: Color- Normal Color. Moisture- Normal Moisture.  Neck Carotid Arteries- Normal color. Moisture- Normal Moisture. No carotid bruits. No JVD.  Chest and Lung Exam Auscultation: Breath Sounds:-Normal.  Cardiovascular Auscultation:Rythm- Regular. Murmurs & Other Heart Sounds:Auscultation of the heart reveals- No Murmurs.  Abdomen Inspection:-Inspeection Normal. Palpation/Percussion:Note:No mass. Palpation and Percussion of the abdomen  reveal- Non Tender, Non Distended + BS, no rebound or guarding.    Neurologic Cranial Nerve exam:- CN III-XII intact(No nystagmus), symmetric smile. Strength:- 5/5 equal and symmetric strength both upper and lower extremities.      Assessment & Plan:  For your alcohol abuse recovery, I want you to take all medications rehab facility gave you. I talked with [facility] Pharmacist down stairs and vivitrol is available down stairs and covered.   Please go downstairs and get med now. I  will in mean time try to arrange for one of RN to give you the injection or if wife who is RN can give.(Discuss that with pharmacist)  Follow up in 2 weeks with myself or as needed.  Pt states he may pick up vivitrol later if delay since he needs to get to work  Asked pt to sign release form so we can get rehab records.  Aharon Carriere, Percell Miller, PA-C

## 2016-05-30 NOTE — Progress Notes (Signed)
Pre visit review using our clinic tool,if applicable. No additional management support is needed unless otherwise documented below in the visit note.  

## 2016-05-31 ENCOUNTER — Ambulatory Visit: Payer: Self-pay | Admitting: Family

## 2016-06-04 NOTE — Telephone Encounter (Signed)
I will need more information and an office visit before I can make recommendations.  In the meantime, I would recommend that he get in touch with the rehab doctor to discuss any questions he has about the medication that the rehab is prescribing.

## 2016-06-04 NOTE — Telephone Encounter (Signed)
Willie [name]-- you have already advised that pt schedule office visit with a Provider. I am unfamiliar with the injectable medication he is speaking of and was not sure if it is something we can administer if we did not prescribe it? Please advise?

## 2016-06-04 NOTE — Telephone Encounter (Signed)
Willie [name]-- I see that pt saw you on 05/30/16 and you advised him to get viviitrol Rx downstairs. I do not see that we sent a prescription. Pt is wanting to schedule a nurse visit for the injection but they will need an order (? How often / long to administer, is there anything specific they should know or let pt know while on the medication).

## 2016-06-04 NOTE — Telephone Encounter (Signed)
[name]-- Please see office notes from 05/30/16. Are we just doing this injection one time? Who will prescribe this going forward?    Percell Miller-- Verified with Suezanne Jacquet that pt did pick up Rx on 05/30/16. States injection is given every 28 days and there are detailed instructions in the box re: how to mix and give medication.

## 2016-06-04 NOTE — Telephone Encounter (Signed)
Pt called in because he said that he has been prescribed Visterol from his rehab physician. He said that he picked up medication from pharmacy and was advised that all he need is to have a nurse in the office to inject. Informed pt that I would like to be advised by provider before scheduling a nurse visit.   Please advise.

## 2016-06-04 NOTE — Telephone Encounter (Signed)
This prescription was written by MD in [place] for alcohol abuse/rehab. As I understand pt dropped it off at our pharmacy. I talked with Suezanne Jacquet and he said he ran the script and would fill. Pt was very busy and had to get back to work(so he did not pick up other day. So would you talk with Suezanne Jacquet and verify he can fill. This is special medication for abuse. Provider need special license to write(this is [place] understanding). So he should go ahead and fill the script. Let me know what Suezanne Jacquet says. Verify he still has script and can fill.

## 2016-06-05 NOTE — Telephone Encounter (Signed)
Notified pt and he voices understanding. He would like to be linked up with local resources. Reports that prescribing physician is from [facility] and will not be able to administer it. Will local resources be able to administer medication? Pt was concerned that the medication has a shelf life of 7 days?. Advised pt that should only be once the medication is mixed. Confirmed this with the pharmacy.  Please advise?

## 2016-06-05 NOTE — Telephone Encounter (Signed)
Notified pt and he voices understanding. He only took down the #s for the [facility] and [facility]. I copied and forwarded the additional facilities info to him via mychart.

## 2016-06-05 NOTE — Telephone Encounter (Signed)
I have reviewed this case with Dr. Charlett Blake. We do not prescribe and are not set up to administer this type of drug in our clinic and the prescribing physician is unable to supervise the administration,  therefore we will not be able to administrater the medication here.  I am happy to help him get linked up with some local services for help if he would like.

## 2016-06-05 NOTE — Telephone Encounter (Signed)
Patient calling checking on the status of message below, best # [phone] (M)

## 2016-06-05 NOTE — Telephone Encounter (Signed)
  He can call the [facility] at [phone].  I don't know if they were administer the medication that he has been given.  They do offer extensive counseling services which may be helpful for him.   I did find some providers in the area who are licensed to prescribe viviitriol who he can try to establish with:  [facility]  Prescriber, Injection Provider, Counseling, [place]  [address]  [place], [place] [address]  [phone] [email or web address] Dr. Rona Ravens [facility]  Prescriber, Injection Provider, Counseling, Outpatient Facility  [address] [place]. [place], [place] [address]  [phone] Dr. Lillia Corporal   [facility]  Prescriber, Injection Provider, [place]  [place],  [address]  [phone] Dr. Talbert Forest Corrington

## 2016-06-06 MED FILL — HYDROXYZINE PAM 25 MG CAP: 25 | 30 days supply | Qty: 180 | Fill #0

## 2016-06-07 NOTE — Telephone Encounter (Signed)
I am not licensed to prescribe that medication. It requires a special certification which most providers do not have.

## 2016-06-07 NOTE — Telephone Encounter (Signed)
Notified pt and he voices understanding. 

## 2016-06-13 MED FILL — HumaLOG 100 UNIT/ML SOLN: 100 | 67 days supply | Qty: 10 | Fill #0

## 2016-06-20 MED FILL — TRUEPLUS SYR 0.5ML 30GX5/16: 30G X 5/16" | 40 days supply | Qty: 200 | Fill #0

## 2016-06-21 ENCOUNTER — Telehealth: Payer: Self-pay

## 2016-06-21 MED ORDER — CITALOPRAM HYDROBROMIDE 40 MG PO TABS: 40.0000 mg | ORAL_TABLET | Freq: Every day | ORAL | 0 refills | Status: DC

## 2016-06-21 NOTE — Telephone Encounter (Signed)
Sent 2 week supply of Escitalopram to pharmacy pt. Need follow up for more refills Please call and schedule appointment

## 2016-06-24 NOTE — Telephone Encounter (Signed)
Pt has been scheduled.  °

## 2016-06-25 ENCOUNTER — Telehealth: Payer: Self-pay | Admitting: *Deleted

## 2016-06-25 ENCOUNTER — Encounter: Payer: Self-pay | Admitting: Family

## 2016-06-25 ENCOUNTER — Ambulatory Visit (INDEPENDENT_AMBULATORY_CARE_PROVIDER_SITE_OTHER): Payer: 59 | Admitting: Family

## 2016-06-25 VITALS — BP 118/92 | HR 108 | Temp 98.3°F | Resp 16 | Ht 70.0 in | Wt 219.8 lb

## 2016-06-25 DIAGNOSIS — F418 Other specified anxiety disorders: Secondary | ICD-10-CM | POA: Diagnosis not present

## 2016-06-25 DIAGNOSIS — E349 Endocrine disorder, unspecified: Secondary | ICD-10-CM | POA: Diagnosis not present

## 2016-06-25 DIAGNOSIS — Z87898 Personal history of other specified conditions: Secondary | ICD-10-CM

## 2016-06-25 DIAGNOSIS — R7989 Other specified abnormal findings of blood chemistry: Secondary | ICD-10-CM

## 2016-06-25 DIAGNOSIS — Z794 Long term (current) use of insulin: Secondary | ICD-10-CM

## 2016-06-25 DIAGNOSIS — R61 Generalized hyperhidrosis: Secondary | ICD-10-CM

## 2016-06-25 DIAGNOSIS — G4700 Insomnia, unspecified: Secondary | ICD-10-CM

## 2016-06-25 DIAGNOSIS — E785 Hyperlipidemia, unspecified: Secondary | ICD-10-CM | POA: Diagnosis not present

## 2016-06-25 DIAGNOSIS — Z720 Tobacco use: Secondary | ICD-10-CM | POA: Diagnosis not present

## 2016-06-25 DIAGNOSIS — IMO0001 Reserved for inherently not codable concepts without codable children: Secondary | ICD-10-CM

## 2016-06-25 DIAGNOSIS — E119 Type 2 diabetes mellitus without complications: Secondary | ICD-10-CM

## 2016-06-25 DIAGNOSIS — E1165 Type 2 diabetes mellitus with hyperglycemia: Secondary | ICD-10-CM

## 2016-06-25 DIAGNOSIS — F1011 Alcohol abuse, in remission: Secondary | ICD-10-CM

## 2016-06-25 LAB — COMPREHENSIVE METABOLIC PANEL
ALBUMIN: 4.4 g/dL (ref 3.5–5.2)
ALK PHOS: 106 U/L (ref 39–117)
ALT: 27 U/L (ref 0–53)
AST: 19 U/L (ref 0–37)
BILIRUBIN TOTAL: 0.4 mg/dL (ref 0.2–1.2)
BUN: 7 mg/dL (ref 6–23)
CO2: 31 mEq/L (ref 19–32)
Calcium: 9.7 mg/dL (ref 8.4–10.5)
Chloride: 101 mEq/L (ref 96–112)
Creatinine, Ser: 0.8 mg/dL (ref 0.40–1.50)
GFR: 137.91 mL/min (ref >60.00)
GLUCOSE: 212 mg/dL — AB (ref 70–99)
Potassium: 3.8 mEq/L (ref 3.5–5.1)
SODIUM: 137 meq/L (ref 135–145)
Total Protein: 7.3 g/dL (ref 6.0–8.3)

## 2016-06-25 LAB — LIPID PANEL
CHOL/HDL RATIO: 3
Cholesterol: 119 mg/dL (ref 0–200)
HDL: 45.2 mg/dL (ref >39.00)
LDL Cholesterol: 55 mg/dL (ref 0–99)
NONHDL: 74.17
Triglycerides: 98 mg/dL (ref 0.0–149.0)
VLDL: 19.6 mg/dL (ref 0.0–40.0)

## 2016-06-25 LAB — HEMOGLOBIN A1C: Hgb A1c MFr Bld: 10.2 % — ABNORMAL HIGH (ref 4.6–6.5)

## 2016-06-25 LAB — TSH: TSH: 1.15 u[IU]/mL (ref 0.35–4.50)

## 2016-06-25 MED ORDER — INSULIN GLARGINE 300 UNIT/ML ~~LOC~~ SOPN: 30.0000 [IU] | PEN_INJECTOR | Freq: Every day | SUBCUTANEOUS | 5 refills | Status: DC

## 2016-06-25 MED ORDER — ZOLPIDEM TARTRATE 5 MG PO TABS: 5.0000 mg | ORAL_TABLET | Freq: Every evening | ORAL | 0 refills | Status: DC | PRN

## 2016-06-25 MED ORDER — POTASSIUM CHLORIDE CRYS ER 10 MEQ PO TBCR: 20.0000 meq | EXTENDED_RELEASE_TABLET | Freq: Every day | ORAL | 1 refills | Status: DC

## 2016-06-25 MED ORDER — ESCITALOPRAM OXALATE 20 MG PO TABS: 20.0000 mg | ORAL_TABLET | Freq: Every day | ORAL | 1 refills | Status: DC

## 2016-06-25 MED ORDER — THIAMINE HCL 100 MG PO TABS: 100.0000 mg | ORAL_TABLET | Freq: Every day | ORAL | 0 refills | Status: DC

## 2016-06-25 MED ORDER — TADALAFIL 10 MG PO TABS: 10.0000 mg | ORAL_TABLET | Freq: Every day | ORAL | 5 refills | Status: DC | PRN

## 2016-06-25 MED ORDER — VALACYCLOVIR HCL 500 MG PO TABS: ORAL_TABLET | ORAL | 1 refills | Status: DC

## 2016-06-25 MED ORDER — INSULIN GLARGINE 100 UNIT/ML SOLOSTAR PEN: 30.0000 [IU] | PEN_INJECTOR | Freq: Every day | SUBCUTANEOUS | 2 refills | Status: DC

## 2016-06-25 MED ORDER — AMLODIPINE BESYLATE 10 MG PO TABS: 10.0000 mg | ORAL_TABLET | Freq: Every day | ORAL | 1 refills | Status: DC

## 2016-06-25 MED ORDER — PANTOPRAZOLE SODIUM 40 MG PO TBEC: 40.0000 mg | DELAYED_RELEASE_TABLET | Freq: Every day | ORAL | 1 refills | Status: DC

## 2016-06-25 MED ORDER — THIAMINE HCL 100 MG PO TABS: 100.0000 mg | ORAL_TABLET | Freq: Every day | ORAL | 1 refills | Status: DC

## 2016-06-25 MED ORDER — CLONIDINE HCL 0.1 MG PO TABS: 0.1000 mg | ORAL_TABLET | Freq: Two times a day (BID) | ORAL | 0 refills | Status: DC

## 2016-06-25 MED ORDER — QUETIAPINE FUMARATE 300 MG PO TABS: 300.0000 mg | ORAL_TABLET | Freq: Every day | ORAL | 1 refills | Status: DC

## 2016-06-25 MED ORDER — LISINOPRIL 20 MG PO TABS: ORAL_TABLET | ORAL | 1 refills | Status: DC

## 2016-06-25 MED FILL — QUETIAPINE FUMARATE 300 MG: 300 | 90 days supply | Qty: 90 | Fill #0

## 2016-06-25 MED FILL — ESCITALOPRAM 20 MG TABLET: 20 | 90 days supply | Qty: 90 | Fill #0

## 2016-06-25 MED FILL — LANTUS SOLOSTAR 100 UNITS/M: 100 | 50 days supply | Qty: 15 | Fill #0

## 2016-06-25 MED FILL — ZOLPIDEM TARTRATE 5 MG TAB: 5 | 30 days supply | Qty: 30 | Fill #0

## 2016-06-25 MED FILL — cloNIDine HCL 0.1 MG TABS: 0.1 | 30 days supply | Qty: 60 | Fill #0

## 2016-06-25 MED FILL — POTASSIUM CITRATE ER 10 MEQ: 10 MEQ | 90 days supply | Qty: 180 | Fill #0

## 2016-06-25 MED FILL — LISINOPRIL 20 MG TABLET: 20 | 90 days supply | Qty: 90 | Fill #0

## 2016-06-25 MED FILL — VITAMIN B-1 100 MG TABLET: 100 | 100 days supply | Qty: 100 | Fill #0

## 2016-06-25 MED FILL — AMLODIPINE BESYLATE 10 MG T: 10 | 90 days supply | Qty: 90 | Fill #0

## 2016-06-25 MED FILL — VALACYCLOVIR HCL 500 MG TAB: 500 | 90 days supply | Qty: 90 | Fill #0

## 2016-06-25 MED FILL — PANTOPRAZOLE SOD DR 40 MG T: 40 | 90 days supply | Qty: 90 | Fill #0

## 2016-06-25 NOTE — Assessment & Plan Note (Signed)
Currently sober x >60 days.  He is working with the vivitrol rep to possibly begin injections with a local certified provider.

## 2016-06-25 NOTE — Telephone Encounter (Addendum)
Received prior auth notice via covermymeds for Cialis rx. Need to know what dx medication is being prescribed for and length of treatment? Also needs to know other alternatives pt has tried for this condition.  Please advise?

## 2016-06-25 NOTE — Assessment & Plan Note (Signed)
Currently stable. Continue lexapro and seroquel.

## 2016-06-25 NOTE — Telephone Encounter (Signed)
Lantus rx sent.

## 2016-06-25 NOTE — Telephone Encounter (Signed)
Rx is for ED. Can you review med list to see if he has been on viagra?

## 2016-06-25 NOTE — Assessment & Plan Note (Signed)
Control seems better per report. Will check follow up A1C, continue toujeo.

## 2016-06-25 NOTE — Assessment & Plan Note (Signed)
New problem. Advised pt on importance of quitting smoking.

## 2016-06-25 NOTE — Patient Instructions (Addendum)
Complete lab work prior to leaving.  Great job quitting drinking. Keep up the good work with [place].

## 2016-06-25 NOTE — Progress Notes (Signed)
Pre visit review using our clinic review tool, if applicable. No additional management support is needed unless otherwise documented below in the visit note. 

## 2016-06-25 NOTE — Progress Notes (Signed)
Subjective:    Patient ID: Willie [name], male    DOB: 1976/02/16, 41 y.o.   MRN: [id number]  HPI  Mr. Allocco is a 41 yr old male who presents today for follow up.  Since he was last here he spent some time at an alcohol abuse treatment center in [place].    DWI class every Monday.  AA meetings 1-2 times a week. Working at [employer].  Has his own apartment.   He is working with [employer] who is working with him to try to get a site for him.   Tobacco abuse- reports that he started smoking when he was in rehab.  4 cigarettes a day.    HTN- current bp meds include lisinopril, clonidine, amlodipine.   BP Readings from Last 3 Encounters:  06/25/16 (!) 118/92  05/30/16 130/80  04/13/16 (!) 146/102   DM2- he is taking 35 units of toujeo currently.  Notes that his sugars range 200's.    Lab Results  Component Value Date   HGBA1C 12.6 Repeated and verified X2. (H) 09/13/2015   HGBA1C 10.7 (H) 03/21/2015   HGBA1C 10.0 (H) 11/09/2014   Lab Results  Component Value Date   MICROALBUR 1.0 05/24/2014   LDLCALC 59 11/23/2014   CREATININE 0.77 04/12/2016    Depression- reports that his mood is better on seroquel.  He does report + night sweats.   Review of Systems See HPI  Past Medical History:  Diagnosis Date  . Alcoholic pancreatitis    recurrent  . Depression   . Diabetes mellitus, type II ([place])    New Onset 03/2010  . GERD (gastroesophageal reflux disease)   . High cholesterol   . 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 Hypertension   . Sleep apnea      Social History   Social History  . Marital status: Married    Spouse name: N/A  . Number of children: 2  . Years of education: N/A   Occupational History  . 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 Smoking status: Current Every Day Smoker    Types: Cigarettes    Start date: 04/29/2016  . Smokeless  tobacco: Never Used     Comment: 4 Cigarettes a day   . Alcohol use No     Comment: 3 weeks ago, last drink  . Drug use: No  . Sexual activity: Not on file   Other Topics Concern  . 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 LUMBAR MICRODISCECTOMY  ~ 2004   Dr Ellene Route    Family History  Problem Relation Age of Onset  . Diabetes Mother   . Lung cancer Brother     twin brother  . Pancreatic cancer Paternal Aunt   . Colon cancer Neg Hx   . Stomach cancer Neg Hx     Allergies  Allergen Reactions  . Invokamet [Canagliflozin-Metformin Hcl] Other (See Comments)    Lactic acidosis  . Metformin And Related     Drastic drop in  blood sugar    Current Outpatient Prescriptions on File Prior to Visit  Medication Sig Dispense Refill  . atorvastatin (LIPITOR) 20 MG tablet Take 1 tablet (20 mg total) by mouth daily. 90 tablet 1  . Multiple Vitamins-Minerals (MULTIVITAMIN) tablet Take 1 tablet by mouth daily. 30 tablet 0  . ULTICARE SHORT PEN NEEDLES 31G X 8 MM MISC USE ONCE A DAY TO INJECT INSULIN 100 each 0   No current facility-administered medications on file prior to visit.     BP (!) 118/92 (BP Location: Left Arm, Patient Position: Sitting, Cuff Size: Large)   Pulse (!) 108   Temp 98.3 F (36.8 C) (Oral)   Resp 16   Ht 5\' 10"  (1.778 m)   Wt 219 lb 12.8 oz (99.7 kg)   SpO2 100%   BMI 31.54 kg/m       Objective:   Physical Exam  Constitutional: He is oriented to person, place, and time. He appears well-developed and well-nourished. No distress.  HENT:  Head: Normocephalic and atraumatic.  Cardiovascular: Normal rate and regular rhythm.   No murmur heard. Pulmonary/Chest: Effort normal and breath sounds normal. No respiratory  distress. He has no wheezes. He has no rales.  Musculoskeletal: He exhibits no edema.  Neurological: He is alert and oriented to person, place, and time.  Skin: Skin is warm and dry.  Psychiatric: He has a normal mood and affect. His behavior is normal. Thought content normal.          Assessment & Plan:  Night sweats- could be side effect of medications.  Will check TSH, HIV screen as well as TB screen.  Low testosterone- was told low by rehab doctor, will repeat level here.

## 2016-06-25 NOTE — Assessment & Plan Note (Signed)
Stable with prn ambien. A UDS is obtained today and an updated controlled substance contract is reviewed.

## 2016-06-25 NOTE — Telephone Encounter (Signed)
Ok to change to lantus, same directions please.

## 2016-06-25 NOTE — Telephone Encounter (Signed)
Received call from [place] at [facility]. States insurance will not cover toujeo and copay card will not help. Wants new rx for lantus? Please advise?

## 2016-06-26 LAB — HIV ANTIBODY (ROUTINE TESTING W REFLEX): HIV: NONREACTIVE

## 2016-06-26 NOTE — Telephone Encounter (Signed)
OK, lets include the viagra as a past med on PA.

## 2016-06-26 NOTE — Telephone Encounter (Signed)
PA completed via covermymeds and awaiting determination.

## 2016-06-26 NOTE — Telephone Encounter (Signed)
Start date 12/09/12 was told to stop taking at discharge on 02/12/13

## 2016-06-27 LAB — TESTOSTERONE, FREE & TOTAL
Free Testosterone: 99.1 pg/mL (ref 35.0–155.0)
Testosterone, total: 503 ng/dL (ref 250–1100)

## 2016-06-27 LAB — QUANTIFERON TB GOLD ASSAY (BLOOD)
Interferon Gamma Release Assay: NEGATIVE
Mitogen-Nil: >10 IU/mL
Quantiferon Nil Value: 0.02 IU/mL
Quantiferon Tb Ag Minus Nil Value: 0.01 IU/mL

## 2016-06-28 ENCOUNTER — Telehealth: Payer: Self-pay | Admitting: Family

## 2016-06-28 DIAGNOSIS — IMO0001 Reserved for inherently not codable concepts without codable children: Secondary | ICD-10-CM

## 2016-06-28 DIAGNOSIS — Z794 Long term (current) use of insulin: Principal | ICD-10-CM

## 2016-06-28 DIAGNOSIS — E1165 Type 2 diabetes mellitus with hyperglycemia: Principal | ICD-10-CM

## 2016-06-28 NOTE — Telephone Encounter (Signed)
hiv testing, TB screening negative. Thyroid and testosterone are normal.  Cholesterol looks good.  Sugar remains poorly controlled.  I would like to refer him to endocrinology for further evaluation.

## 2016-06-28 NOTE — Telephone Encounter (Signed)
Spoke with pt. about lab results and referral to endo. Pt voiced understanding.

## 2016-07-02 NOTE — Telephone Encounter (Signed)
PA approved via covermymeds. Spoke with Lauralyn Primes in the pharmacy and they are aware.

## 2016-07-05 ENCOUNTER — Telehealth: Payer: Self-pay | Admitting: *Deleted

## 2016-07-05 MED ORDER — HYDROXYZINE PAMOATE 25 MG PO CAPS: 25.0000 mg | ORAL_CAPSULE | Freq: Three times a day (TID) | ORAL | 2 refills | Status: DC | PRN

## 2016-07-05 MED FILL — HYDROXYZINE PAM 25 MG CAP: 25 | 30 days supply | Qty: 90 | Fill #0

## 2016-07-05 NOTE — Telephone Encounter (Signed)
Ok to send #90 with 2 refills.

## 2016-07-05 NOTE — Telephone Encounter (Signed)
Rx sent 

## 2016-07-05 NOTE — Telephone Encounter (Signed)
Received fax from [place] for hydroxyzine 25mg  1 to 2 capsules by mouth three times daily as needed. Previous rx came from Dr Zenia Resides in [place].  Please advise?

## 2016-07-11 ENCOUNTER — Encounter: Payer: Self-pay | Admitting: Endocrinology

## 2016-07-11 ENCOUNTER — Ambulatory Visit (INDEPENDENT_AMBULATORY_CARE_PROVIDER_SITE_OTHER): Payer: 59 | Admitting: Endocrinology

## 2016-07-11 VITALS — BP 134/78 | HR 102 | Ht 70.0 in | Wt 226.0 lb

## 2016-07-11 DIAGNOSIS — Z794 Long term (current) use of insulin: Secondary | ICD-10-CM

## 2016-07-11 DIAGNOSIS — E1165 Type 2 diabetes mellitus with hyperglycemia: Secondary | ICD-10-CM | POA: Diagnosis not present

## 2016-07-11 DIAGNOSIS — IMO0001 Reserved for inherently not codable concepts without codable children: Secondary | ICD-10-CM

## 2016-07-11 MED ORDER — BASAGLAR KWIKPEN 100 UNIT/ML ~~LOC~~ SOPN: 35.0000 [IU] | PEN_INJECTOR | Freq: Every day | SUBCUTANEOUS | 3 refills | Status: DC

## 2016-07-11 MED ORDER — GLUCOSE BLOOD VI STRP: 1.0000 | ORAL_STRIP | Freq: Two times a day (BID) | 3 refills | Status: DC

## 2016-07-11 MED ORDER — INSULIN ASPART 100 UNIT/ML FLEXPEN: PEN_INJECTOR | SUBCUTANEOUS | 11 refills | Status: DC

## 2016-07-11 NOTE — Progress Notes (Signed)
Subjective:    Patient ID: Willie [name], male    DOB: 1976/04/08, 41 y.o.   MRN: [id number]  HPI pt is referred by Debbrah Alar, NP, for diabetes.  Pt states DM was dx'ed in 2009; he has mild if any neuropathy of the lower extremities; he is unaware of any associated chronic complications; he has been on insulin since 2015; pt says his diet and exercise are good; he has never had pancreatic surgery, severe hypoglycemia or DKA.  He has had pancreatitis approx 6 times (most recently in 2017).  He takes lantus 35/d, and prn humalog (averages a total of approx 18 units per day).  He says cbg's vary from 200-300.  It is in general higher as the day goes on.  Last EtOH was 2 mos ago.   Past Medical History:  Diagnosis Date  . Alcoholic pancreatitis    recurrent  . Depression   . Diabetes mellitus, type II ([place])    New Onset 03/2010  . GERD (gastroesophageal reflux disease)   . High cholesterol   . 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 Hypertension   . Sleep apnea     Past Surgical History:  Procedure Laterality Date  . LUMBAR MICRODISCECTOMY  ~ 2004   Dr Ellene Route    Social History   Social History  . Marital status: Married    Spouse name: N/A  . Number of children: 2  . Years of education: N/A   Occupational History  . 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 Smoking status: Current Every Day Smoker    Types: Cigarettes    Start date: 04/29/2016  . Smokeless tobacco: Never Used     Comment: 4 Cigarettes a day   . Alcohol use No     Comment: 3 weeks ago, last drink  . Drug use: No  . Sexual activity: Not on file   Other Topics Concern  . 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 amLODipine (NORVASC) 10 MG tablet Take 1 tablet (10 mg total) by mouth daily. 90 tablet 1  . atorvastatin (LIPITOR) 20 MG tablet Take 1 tablet (20 mg total) by mouth daily. 90 tablet 1  . cloNIDine (CATAPRES) 0.1 MG tablet Take 1 tablet (0.1 mg total) by mouth 2 (two) times daily. (Patient taking differently: 0.1 mg. 2 tabs by mouth three times per day.) 60 tablet 0  . escitalopram (LEXAPRO) 20 MG tablet Take 1 tablet (20 mg total) by mouth daily. 90 tablet 1  . hydrOXYzine (VISTARIL) 25 MG capsule Take 1 capsule (25 mg total) by mouth 3 (three) times daily as needed. 90 capsule 2  . lisinopril (PRINIVIL,ZESTRIL) 20 MG tablet TAKE 1 TABLET (20 MG TOTAL) BY MOUTH DAILY. 90 tablet 1  . Multiple Vitamins-Minerals (MULTIVITAMIN) tablet Take 1 tablet by mouth daily. 30 tablet 0  . pantoprazole (PROTONIX) 40 MG tablet Take 1 tablet (40 mg total) by mouth daily. 90 tablet 1  . potassium chloride (KLOR-CON M10) 10  MEQ tablet Take 2 tablets (20 mEq total) by mouth daily. 180 tablet 1  . QUEtiapine (SEROQUEL) 300 MG tablet Take 1 tablet (300 mg total) by mouth at bedtime. 90 tablet 1  . tadalafil (CIALIS) 10 MG tablet Take 1 tablet (10 mg total) by mouth daily as needed for erectile dysfunction. 10 tablet 5  . thiamine 100 MG tablet Take 1 tablet (100 mg total) by mouth daily. 90 tablet 1  . valACYclovir (VALTREX) 500 MG tablet TAKE 1 TABLET (500 MG TOTAL) BY MOUTH DAILY. 90 tablet 1  . zolpidem (AMBIEN) 5 MG tablet Take 1 tablet (5 mg total) by mouth at bedtime as needed. for sleep 30 tablet 0   No current facility-administered medications on file prior to visit.     Allergies  Allergen Reactions  . Invokamet [Canagliflozin-Metformin Hcl] Other (See Comments)    Lactic acidosis  . Metformin And Related     Drastic drop in blood sugar    Family History  Problem Relation Age of Onset  . Diabetes Mother   . Lung  cancer Brother     twin brother  . Pancreatic cancer Paternal Aunt   . Colon cancer Neg Hx   . Stomach cancer Neg Hx     BP 134/78   Pulse (!) 102   Ht 5\' 10"  (1.778 m)   Wt 226 lb (102.5 kg)   SpO2 97%   BMI 32.43 kg/m    Review of Systems denies weight loss, headache, chest pain, sob, n/v, muscle cramps, excessive diaphoresis, cold intolerance, rhinorrhea, and easy bruising.  He has gained weight.  He has frequent urination.  Depression is well-controlled.     Objective:   Physical Exam VS: see vs page GEN: no distress HEAD: head: no deformity eyes: no periorbital swelling, no proptosis external nose and ears are normal mouth: no lesion seen NECK: supple, thyroid is not enlarged CHEST WALL: no deformity LUNGS: clear to auscultation CV: reg rate and rhythm, no murmur ABD: abdomen is soft, nontender.  no hepatosplenomegaly.  not distended.  no hernia MUSCULOSKELETAL: muscle bulk and strength are grossly normal.  no obvious joint swelling.  gait is normal and steady EXTEMITIES: no deformity.  no ulcer on the feet.  feet are of normal color and temp.  Trace bilat leg edema PULSES: dorsalis pedis intact bilat.  no carotid bruit NEURO:  cn 2-12 grossly intact.   readily moves all 4's.  sensation is intact to touch on the feet SKIN:  Normal texture and temperature.  No rash or suspicious lesion is visible.   NODES:  None palpable at the neck PSYCH: alert, well-oriented.  Does not appear anxious nor depressed.    Lab Results  Component Value Date   HGBA1C 10.2 (H) 06/25/2016   CT (2017): Pancreas: There appears to be atrophy in the pancreatic body and neck region. There is duct dilatation associated with the areas of pancreatic atrophy. Findings most likely represent the sequelae of prior pancreatitis.  I personally reviewed electrocardiogram tracing (04/12/16): Indication: vomiting Impression: ST.  No MI.  No hypertrophy.  NS-T and TWA Compared to 2016: NS-T and TWA are  new  I have reviewed outside records, and summarized: Pt was noted to have severely elevated a1c, and referred here.  He was noted to be in recovery.        Assessment & Plan:  DM: prob type 2, despite h/o pancreatitis.  Severe exacerbation.    Patient is advised the following: Patient Instructions  good diet and exercise significantly improve the control of your diabetes.  please let me know if you wish to be referred to a dietician.  high blood sugar is very risky to your health.  you should see an eye doctor and dentist every year.  It is very important to get all recommended vaccinations.  Controlling your blood pressure and cholesterol drastically reduces the damage diabetes does to your body.  Those who smoke should quit.  Please discuss these with your doctor.   check your blood sugar twice a day.  vary the time of day when you check, between before the 3 meals, and at bedtime.  also check if you have symptoms of your blood sugar being too high or too low.  please keep a record of the readings and bring it to your next appointment here (or you can bring the meter itself).  You can write it on any piece of paper.  please call us sooner if your blood sugar goes below 70, or if you have a lot of readings over 200.   Based on the number of units you end up needing, you may be able to use a "V-GO device." Here is a new meter.  I have sent a prescription to your pharmacy, for strips.

## 2016-07-11 NOTE — Patient Instructions (Addendum)
good diet and exercise significantly improve the control of your diabetes.  please let me know if you wish to be referred to a dietician.  high blood sugar is very risky to your health.  you should see an eye doctor and dentist every year.  It is very important to get all recommended vaccinations.  Controlling your blood pressure and cholesterol drastically reduces the damage diabetes does to your body.  Those who smoke should quit.  Please discuss these with your doctor.   check your blood sugar twice a day.  vary the time of day when you check, between before the 3 meals, and at bedtime.  also check if you have symptoms of your blood sugar being too high or too low.  please keep a record of the readings and bring it to your next appointment here (or you can bring the meter itself).  You can write it on any piece of paper.  please call us sooner if your blood sugar goes below 70, or if you have a lot of readings over 200.   Based on the number of units you end up needing, you may be able to use a "V-GO device." Here is a new meter.  I have sent a prescription to your pharmacy, for strips.

## 2016-07-17 ENCOUNTER — Other Ambulatory Visit: Payer: Self-pay

## 2016-07-17 MED ORDER — INSULIN LISPRO 100 UNIT/ML (KWIKPEN): PEN_INJECTOR | SUBCUTANEOUS | 4 refills | Status: DC

## 2016-07-17 MED FILL — HUMALOG 100 UNITS/ML KWIKPE: 100 | 50 days supply | Qty: 15 | Fill #0

## 2016-07-18 ENCOUNTER — Ambulatory Visit (INDEPENDENT_AMBULATORY_CARE_PROVIDER_SITE_OTHER): Payer: 59 | Admitting: Medical

## 2016-07-18 ENCOUNTER — Emergency Department (HOSPITAL_BASED_OUTPATIENT_CLINIC_OR_DEPARTMENT_OTHER)
Admission: EM | Admit: 2016-07-18 | Discharge: 2016-07-18 | Disposition: A | Discharge status: Home / Self Care | Payer: 59 | Attending: Emergency Medicine | Admitting: Emergency Medicine

## 2016-07-18 ENCOUNTER — Encounter (HOSPITAL_BASED_OUTPATIENT_CLINIC_OR_DEPARTMENT_OTHER): Payer: Self-pay | Admitting: *Deleted

## 2016-07-18 ENCOUNTER — Encounter: Payer: Self-pay | Admitting: Medical

## 2016-07-18 ENCOUNTER — Emergency Department (HOSPITAL_BASED_OUTPATIENT_CLINIC_OR_DEPARTMENT_OTHER): Payer: 59

## 2016-07-18 VITALS — BP 119/82 | HR 103 | Temp 98.2°F | Resp 18 | Ht 70.0 in | Wt 217.2 lb

## 2016-07-18 DIAGNOSIS — Z794 Long term (current) use of insulin: Secondary | ICD-10-CM | POA: Insufficient documentation

## 2016-07-18 DIAGNOSIS — R6889 Other general symptoms and signs: Secondary | ICD-10-CM

## 2016-07-18 DIAGNOSIS — R0602 Shortness of breath: Secondary | ICD-10-CM | POA: Diagnosis not present

## 2016-07-18 DIAGNOSIS — R059 Cough, unspecified: Secondary | ICD-10-CM

## 2016-07-18 DIAGNOSIS — E1165 Type 2 diabetes mellitus with hyperglycemia: Secondary | ICD-10-CM

## 2016-07-18 DIAGNOSIS — IMO0001 Reserved for inherently not codable concepts without codable children: Secondary | ICD-10-CM

## 2016-07-18 DIAGNOSIS — E119 Type 2 diabetes mellitus without complications: Secondary | ICD-10-CM | POA: Diagnosis not present

## 2016-07-18 DIAGNOSIS — R509 Fever, unspecified: Secondary | ICD-10-CM | POA: Diagnosis not present

## 2016-07-18 DIAGNOSIS — Z79899 Other long term (current) drug therapy: Secondary | ICD-10-CM | POA: Insufficient documentation

## 2016-07-18 DIAGNOSIS — I10 Essential (primary) hypertension: Secondary | ICD-10-CM | POA: Diagnosis not present

## 2016-07-18 DIAGNOSIS — J111 Influenza due to unidentified influenza virus with other respiratory manifestations: Secondary | ICD-10-CM

## 2016-07-18 DIAGNOSIS — F17210 Nicotine dependence, cigarettes, uncomplicated: Secondary | ICD-10-CM | POA: Insufficient documentation

## 2016-07-18 DIAGNOSIS — R531 Weakness: Secondary | ICD-10-CM | POA: Diagnosis not present

## 2016-07-18 DIAGNOSIS — R05 Cough: Secondary | ICD-10-CM | POA: Diagnosis not present

## 2016-07-18 LAB — CBC WITH DIFFERENTIAL/PLATELET
BASOS ABS: 0 10*3/uL (ref 0.0–0.1)
Basophils Relative: 0 %
EOS PCT: 0 %
Eosinophils Absolute: 0 10*3/uL (ref 0.0–0.7)
HEMATOCRIT: 42.5 % (ref 39.0–52.0)
Hemoglobin: 15.5 g/dL (ref 13.0–17.0)
LYMPHS ABS: 1.1 10*3/uL (ref 0.7–4.0)
LYMPHS PCT: 13 %
MCH: 29 pg (ref 26.0–34.0)
MCHC: 36.5 g/dL — ABNORMAL HIGH (ref 30.0–36.0)
MCV: 79.4 fL (ref 78.0–100.0)
Monocytes Absolute: 0.9 10*3/uL (ref 0.1–1.0)
Monocytes Relative: 11 %
NEUTROS ABS: 6.4 10*3/uL (ref 1.7–7.7)
Neutrophils Relative %: 76 %
Platelets: 265 10*3/uL (ref 150–400)
RBC: 5.35 MIL/uL (ref 4.22–5.81)
RDW: 12.4 % (ref 11.5–15.5)
WBC: 8.4 10*3/uL (ref 4.0–10.5)

## 2016-07-18 LAB — BASIC METABOLIC PANEL
ANION GAP: 11 (ref 5–15)
BUN: 13 mg/dL (ref 6–20)
CHLORIDE: 96 mmol/L — AB (ref 101–111)
CO2: 24 mmol/L (ref 22–32)
Calcium: 8.8 mg/dL — ABNORMAL LOW (ref 8.9–10.3)
Creatinine, Ser: 1 mg/dL (ref 0.61–1.24)
GFR calc Af Amer: >60 mL/min (ref >60)
GFR calc non Af Amer: >60 mL/min (ref >60)
Glucose, Bld: 191 mg/dL — ABNORMAL HIGH (ref 65–99)
POTASSIUM: 3.3 mmol/L — AB (ref 3.5–5.1)
Sodium: 131 mmol/L — ABNORMAL LOW (ref 135–145)

## 2016-07-18 LAB — POCT GLUCOSE (DEVICE FOR HOME USE): Glucose Fasting, POC: 195 mg/dL — AB (ref 70–99)

## 2016-07-18 LAB — POC INFLUENZA A&B (BINAX/QUICKVUE)
Influenza A, POC: POSITIVE — AB
Influenza B, POC: NEGATIVE

## 2016-07-18 LAB — POCT RAPID STREP A (OFFICE): RAPID STREP A SCREEN: NEGATIVE

## 2016-07-18 MED ORDER — OSELTAMIVIR PHOSPHATE 75 MG PO CAPS: 75.0000 mg | ORAL_CAPSULE | Freq: Two times a day (BID) | ORAL | 0 refills | Status: DC

## 2016-07-18 MED ORDER — HYDROCODONE-HOMATROPINE 5-1.5 MG/5ML PO SYRP: 5.0000 mL | ORAL_SOLUTION | Freq: Four times a day (QID) | ORAL | 0 refills | Status: DC | PRN

## 2016-07-18 MED ORDER — SODIUM CHLORIDE 0.9 % IV BOLUS (SEPSIS)
1000.0000 mL | Freq: Once | INTRAVENOUS | Status: AC
  Administered 2016-07-18: 1000 mL via INTRAVENOUS

## 2016-07-18 MED ORDER — SODIUM CHLORIDE 0.9 % IV BOLUS (SEPSIS): 1000.0000 mL | Freq: Once | INTRAVENOUS | Status: DC

## 2016-07-18 MED ORDER — KETOROLAC TROMETHAMINE 30 MG/ML IJ SOLN
15.0000 mg | Freq: Once | INTRAMUSCULAR | Status: AC
  Administered 2016-07-18: 15 mg via INTRAVENOUS
  Filled 2016-07-18: qty 1

## 2016-07-18 MED FILL — HYDROCODONE-HOMATROPINE SYR: 5-1.5 | 6 days supply | Qty: 120 | Fill #0

## 2016-07-18 MED FILL — TAMIFLU 75 MG GELCAP: 75 | 5 days supply | Qty: 10 | Fill #0

## 2016-07-18 NOTE — ED Triage Notes (Signed)
He was sent from his doctors office after having a positive flu test in the office this am. Aching all over and weak.

## 2016-07-18 NOTE — Progress Notes (Signed)
Subjective:    Patient ID: Willie [name], male    DOB: 1975-11-07, 41 y.o.   MRN: [id number]  HPI  Pt in feeling weak since Monday. Pt state Tuesday felt ok. Body aches and chills started on Tuesday night. Some sweats. Yesterday stayed in bed. Drank sugar free power aid and water.   Tuesday started to get chest congested and sinus congestion. Some left lower rib pain when he coughs. Pt took tylenol before he came in. Pt states he feel wiped.Pt feels very weak.   Pt is coughing up mucous.   Pt felt feverish last night. Thermometer at his house broke.  Pt is uncontrolled diabetic. A1-c was 10.2  Pt has not been checking his sugars. Pt is on insulin.  But not using since sick.  Bs today 195 fasting.   Review of Systems  Constitutional: Positive for chills, fatigue and fever.  HENT: Negative for congestion.   Respiratory: Positive for cough. Negative for chest tightness, shortness of breath and wheezing.   Cardiovascular: Negative for chest pain and palpitations.  Gastrointestinal: Negative for abdominal pain, constipation, diarrhea and nausea.  Musculoskeletal: Positive for myalgias. Negative for back pain.  Skin: Negative for rash.  Psychiatric/Behavioral: Negative for behavioral problems and confusion.   Past Medical History:  Diagnosis Date  . Alcoholic pancreatitis    recurrent  . Depression   . Diabetes mellitus, type II ([place])    New Onset 03/2010  . GERD (gastroesophageal reflux disease)   . High cholesterol   . 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 Hypertension   . Sleep apnea      Social History   Social History  . Marital status: Married    Spouse name: N/A  . Number of children: 2  . Years of education: N/A   Occupational History  . 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 Smoking status: Current Every Day Smoker    Types: Cigarettes    Start date:  04/29/2016  . Smokeless tobacco: Never Used     Comment: 4 Cigarettes a day   . Alcohol use No     Comment: 3 weeks ago, last drink  . Drug use: No  . Sexual activity: Not on file   Other Topics Concern  . 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 LUMBAR MICRODISCECTOMY  ~ 2004   Dr Ellene Route    Family History  Problem Relation Age of Onset  . Diabetes Mother   . Lung cancer Brother     twin brother  . Pancreatic cancer Paternal Aunt   . Colon cancer Neg Hx   . Stomach cancer Neg Hx     Allergies  Allergen Reactions  . Invokamet [Canagliflozin-Metformin Hcl] Other (See Comments)    Lactic acidosis  . Metformin And Related     Drastic drop in blood sugar    No current facility-administered medications on file prior to visit.    Current Outpatient Prescriptions on File Prior to Visit  Medication Sig Dispense Refill  . amLODipine (NORVASC)  10 MG tablet Take 1 tablet (10 mg total) by mouth daily. 90 tablet 1  . atorvastatin (LIPITOR) 20 MG tablet Take 1 tablet (20 mg total) by mouth daily. 90 tablet 1  . cloNIDine (CATAPRES) 0.1 MG tablet Take 1 tablet (0.1 mg total) by mouth 2 (two) times daily. (Patient taking differently: 0.1 mg. 2 tabs by mouth three times per day.) 60 tablet 0  . escitalopram (LEXAPRO) 20 MG tablet Take 1 tablet (20 mg total) by mouth daily. 90 tablet 1  . glucose blood (ONETOUCH VERIO) test strip 1 each by Other route 2 (two) times daily. And lancets 2/day 200 each 3  . hydrOXYzine (VISTARIL) 25 MG capsule Take 1 capsule (25 mg total) by mouth 3 (three) times daily as needed. 90 capsule 2  . Insulin Glargine (BASAGLAR KWIKPEN) 100 UNIT/ML SOPN Inject 0.35 mLs (35 Units total) into the skin at bedtime. 10 pen 3  .  insulin lispro (HUMALOG KWIKPEN) 100 UNIT/ML KiwkPen 3 times a day (just before each meal) 09-10-08 units, 15 mL 4  . lisinopril (PRINIVIL,ZESTRIL) 20 MG tablet TAKE 1 TABLET (20 MG TOTAL) BY MOUTH DAILY. 90 tablet 1  . Multiple Vitamins-Minerals (MULTIVITAMIN) tablet Take 1 tablet by mouth daily. 30 tablet 0  . pantoprazole (PROTONIX) 40 MG tablet Take 1 tablet (40 mg total) by mouth daily. 90 tablet 1  . potassium chloride (KLOR-CON M10) 10 MEQ tablet Take 2 tablets (20 mEq total) by mouth daily. 180 tablet 1  . QUEtiapine (SEROQUEL) 300 MG tablet Take 1 tablet (300 mg total) by mouth at bedtime. 90 tablet 1  . tadalafil (CIALIS) 10 MG tablet Take 1 tablet (10 mg total) by mouth daily as needed for erectile dysfunction. 10 tablet 5  . thiamine 100 MG tablet Take 1 tablet (100 mg total) by mouth daily. 90 tablet 1  . valACYclovir (VALTREX) 500 MG tablet TAKE 1 TABLET (500 MG TOTAL) BY MOUTH DAILY. 90 tablet 1  . zolpidem (AMBIEN) 5 MG tablet Take 1 tablet (5 mg total) by mouth at bedtime as needed. for sleep 30 tablet 0    BP 119/82 (BP Location: Left Arm, Patient Position: Supine, Cuff Size: Large)   Pulse (!) 103   Temp 98.2 F (36.8 C) (Oral)   Resp 18   Ht 5\' 10"  (1.778 m)   Wt 217 lb 4 oz (98.5 kg)   SpO2 97%   BMI 31.17 kg/m       Objective:   Physical Exam  General  Mental Status - Alert. General Appearance - Well groomed. Not in acute distress. But does look very fatigued/exhausted.  Skin Rashes- No Rashes.  HEENT Head- Normal. Ear Auditory Canal - Left- Normal. Right - Normal.Tympanic Membrane- Left- Normal. Right- Normal. Eye Sclera/Conjunctiva- Left- Normal. Right- Normal. Nose & Sinuses Nasal Mucosa- Left-  Boggy and Congested. Right-  Boggy and  Congested.Bilateral maxillary and frontal sinus pressure. Mouth & Throat Lips: Upper Lip- Normal: no dryness, cracking, pallor, cyanosis, or vesicular eruption. Lower Lip-Normal: no dryness, cracking, pallor, cyanosis or  vesicular eruption. Buccal Mucosa- Bilateral- No Aphthous ulcers. Oropharynx- No Discharge or Erythema. Tonsils: Characteristics- Bilateral- No Erythema or Congestion. Size/Enlargement- Bilateral- No enlargement. Discharge- bilateral-None.  Neck Neck- Supple. No Masses.   Chest and Lung Exam Auscultation: Breath Sounds:-even and unlabored. Left lung field rough breath sounds. More at base(pt reports left lower rib pain)  Cardiovascular Auscultation:Rythm- Regular, rate and rhythm. Murmurs & Other Heart Sounds:Ausculatation of the heart reveal- No Murmurs.  Lymphatic  Head & Neck General Head & Neck Lymphatics: Bilateral: Description- No Localized lymphadenopathy.       Assessment & Plan:  You have the flu type A. You appear very weak and likely dehydrated. By exam have some concern for pneumonia.   I recommend ED evaluation for iv fluids, stat labs, and chest xray to assess your current condition.  Follow up with [place] after ED evaluation as they determine.  Talked with ED physician and gave her update  Treatment deferred to ED after work up.  Alin Hutchins, Percell Miller, PA-C

## 2016-07-18 NOTE — Patient Instructions (Addendum)
You have the flu type A. You appear very weak and likely dehydrated. By exam have some concern for pneumonia.   I recommend ED evaluation for iv fluids, stat labs, and chest xray to assess your current condition.  Follow up with [place] after ED evaluation as they determine.  Talked with ED physician and gave her update

## 2016-07-18 NOTE — ED Provider Notes (Signed)
[facility] Provider Note   CSN: [id number] Arrival date & time: 07/18/16  1255     History   Chief Complaint Chief Complaint  Patient presents with  . Influenza    HPI Willie [name] is a 41 y.o. male.  The history is provided by the patient.  Influenza  Presenting symptoms: cough, fever and myalgias   Presenting symptoms comment:  Generalized weakness, anorexia and left sided chest pain.  Occassional SOB but not persistent.  Seen by PCP and sent here for further evaluation Severity:  Moderate Onset quality:  Gradual Duration:  3 days Progression:  Worsening Chronicity:  New Relieved by:  OTC medications Worsened by:  Nothing Ineffective treatments:  None tried Associated symptoms: chills, decreased appetite and decreased physical activity   Associated symptoms: no mental status change, no congestion and no neck stiffness   Risk factors: diabetes   Risk factors comment:  Has not checked blood sugars   Past Medical History:  Diagnosis Date  . Alcoholic pancreatitis    recurrent  . Depression   . Diabetes mellitus, type II ([place])    New Onset 03/2010  . GERD (gastroesophageal reflux disease)   . High cholesterol   . 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 Hypertension   . Sleep apnea     Patient Active Problem List   Diagnosis Date Noted  . History of alcohol abuse 06/25/2016  . Tobacco abuse 06/25/2016  . Anxiety state 11/20/2015  . Dental infection 07/10/2015  . Mood disorder ([place]) 04/18/2015  . Eczema 06/27/2014  . GERD (gastroesophageal reflux disease) 05/24/2014  . Snoring 04/07/2013  . Suicide attempt by substance overdose ([place]) 02/10/2013  . Erectile dysfunction 12/09/2012  . Hematuria 06/11/2012  . Vision problem 02/02/2012  . Insomnia 12/20/2011  . Depression with anxiety 11/13/2010  . Hyperlipemia 06/27/2010  . Diabetes type 2, uncontrolled ([place]) 05/22/2010  . Essential  hypertension 05/22/2010    Past Surgical History:  Procedure Laterality Date  . LUMBAR MICRODISCECTOMY  ~ 2004   Dr Ellene Route       Home Medications    Prior to Admission medications   Medication Sig Start Date End Date Taking? Authorizing Provider  amLODipine (NORVASC) 10 MG tablet Take 1 tablet (10 mg total) by mouth daily. 06/25/16   Debbrah Alar, NP  atorvastatin (LIPITOR) 20 MG tablet Take 1 tablet (20 mg total) by mouth daily. 11/20/15   Debbrah Alar, NP  cloNIDine (CATAPRES) 0.1 MG tablet Take 1 tablet (0.1 mg total) by mouth 2 (two) times daily. Patient taking differently: 0.1 mg. 2 tabs by mouth three times per day. 06/25/16   Debbrah Alar, NP  escitalopram (LEXAPRO) 20 MG tablet Take 1 tablet (20 mg total) by mouth daily. 06/25/16   Debbrah Alar, NP  glucose blood (ONETOUCH VERIO) test strip 1 each by Other route 2 (two) times daily. And lancets 2/day 07/11/16   Renato Shin, MD  hydrOXYzine (VISTARIL) 25 MG capsule Take 1 capsule (25 mg total) by mouth 3 (three) times daily as needed. 07/05/16   Debbrah Alar, NP  Insulin Glargine (BASAGLAR KWIKPEN) 100 UNIT/ML SOPN Inject 0.35 mLs (35 Units total) into the skin at bedtime. 07/11/16   Renato Shin, MD  insulin lispro (HUMALOG KWIKPEN) 100 UNIT/ML KiwkPen 3 times a day (just before each meal) 09-10-08 units, 07/17/16   Renato Shin, MD  lisinopril (PRINIVIL,ZESTRIL) 20 MG tablet TAKE 1 TABLET (20 MG  TOTAL) BY MOUTH DAILY. 06/25/16   Debbrah Alar, NP  Multiple Vitamins-Minerals (MULTIVITAMIN) tablet Take 1 tablet by mouth daily. 02/12/16   April Palumbo, MD  pantoprazole (PROTONIX) 40 MG tablet Take 1 tablet (40 mg total) by mouth daily. 06/25/16   Debbrah Alar, NP  potassium chloride (KLOR-CON M10) 10 MEQ tablet Take 2 tablets (20 mEq total) by mouth daily. 06/25/16   Debbrah Alar, NP  QUEtiapine (SEROQUEL) 300 MG tablet Take 1 tablet (300 mg total) by mouth at bedtime. 06/25/16   Debbrah Alar, NP  tadalafil (CIALIS) 10 MG tablet Take 1 tablet (10 mg total) by mouth daily as needed for erectile dysfunction. 06/25/16   Debbrah Alar, NP  thiamine 100 MG tablet Take 1 tablet (100 mg total) by mouth daily. 06/25/16   Debbrah Alar, NP  valACYclovir (VALTREX) 500 MG tablet TAKE 1 TABLET (500 MG TOTAL) BY MOUTH DAILY. 06/25/16   Debbrah Alar, NP  zolpidem (AMBIEN) 5 MG tablet Take 1 tablet (5 mg total) by mouth at bedtime as needed. for sleep 06/25/16   Debbrah Alar, NP    Family History Family History  Problem Relation Age of Onset  . Diabetes Mother   . Lung cancer Brother     twin brother  . Pancreatic cancer Paternal Aunt   . Colon cancer Neg Hx   . Stomach cancer Neg Hx     Social History Social History  Substance Use Topics  . Smoking status: Current Every Day Smoker    Types: Cigarettes    Start date: 04/29/2016  . Smokeless tobacco: Never Used     Comment: 4 Cigarettes a day   . Alcohol use No     Comment: 3 weeks ago, last drink     Allergies   Invokamet [canagliflozin-metformin hcl] and Metformin and related   Review of Systems Review of Systems  Constitutional: Positive for chills, decreased appetite and fever.  HENT: Negative for congestion.   Respiratory: Positive for cough.   Musculoskeletal: Positive for myalgias. Negative for neck stiffness.  All other systems reviewed and are negative.    Physical Exam Updated Vital Signs BP 114/88 (BP Location: Left Arm)   Pulse (!) 109   Temp 99.1 F (37.3 C) (Oral)   Resp 18   Ht 5\' 10"  (1.778 m)   Wt 217 lb 4 oz (98.5 kg)   SpO2 96%   BMI 31.17 kg/m   Physical Exam  Constitutional: He is oriented to person, place, and time. He appears well-developed and well-nourished. No distress.  HENT:  Head: Normocephalic and atraumatic.  Right Ear: Tympanic membrane normal.  Left Ear: Tympanic membrane normal.  Mouth/Throat: Oropharynx is clear and moist.  Eyes: Conjunctivae  and EOM are normal. Pupils are equal, round, and reactive to light.  Neck: Normal range of motion. Neck supple.  Cardiovascular: Regular rhythm and intact distal pulses.  Tachycardia present.   No murmur heard. Pulmonary/Chest: Effort normal and breath sounds normal. No respiratory distress. He has no wheezes. He has no rales. He exhibits no tenderness.  Abdominal: Soft. He exhibits no distension. There is no tenderness. There is no rebound and no guarding.  Musculoskeletal: Normal range of motion. He exhibits no edema or tenderness.  Neurological: He is alert and oriented to person, place, and time.  Skin: Skin is warm and dry. Capillary refill takes less than 2 seconds. No rash noted. No erythema.  Psychiatric: He has a normal mood and affect. His behavior is normal.  Nursing note  and vitals reviewed.    ED Treatments / Results  Labs (all labs ordered are listed, but only abnormal results are displayed) Labs Reviewed  CBC WITH DIFFERENTIAL/PLATELET - Abnormal; Notable for the following:       Result Value   MCHC 36.5 (*)    All other components within normal limits  BASIC METABOLIC PANEL - Abnormal; Notable for the following:    Sodium 131 (*)    Potassium 3.3 (*)    Chloride 96 (*)    Glucose, Bld 191 (*)    Calcium 8.8 (*)    All other components within normal limits    EKG  EKG Interpretation None       Radiology Dg Chest 2 View  Result Date: 07/18/2016 CLINICAL DATA:  Productive cough, positive flu test to this morning EXAM: CHEST  2 VIEW COMPARISON:  11/17/2014 FINDINGS: Cardiomediastinal silhouette is unremarkable. No infiltrate or pleural effusion. No pulmonary edema. Subtle mild perihilar bronchitic changes. Bony thorax is unremarkable. IMPRESSION: No acute infiltrate or pulmonary edema. Subtle mild perihilar bronchitic changes. Electronically Signed   By: Lahoma Crocker M.D.   On: 07/18/2016 14:38    Procedures Procedures (including critical care  time)  Medications Ordered in ED Medications  sodium chloride 0.9 % bolus 1,000 mL (not administered)  sodium chloride 0.9 % bolus 1,000 mL (1,000 mLs Intravenous New Bag/Given 07/18/16 1347)  ketorolac (TORADOL) 30 MG/ML injection 15 mg (15 mg Intravenous Given 07/18/16 1347)     Initial Impression / Assessment and Plan / ED Course  I have reviewed the triage vital signs and the nursing notes.  Pertinent labs & imaging results that were available during my care of the patient were reviewed by me and considered in my medical decision making (see chart for details).     Pt with symptoms consistent with influenza or other viral syndrome.  Normal exam here but is febrile.  No signs of breathing difficulty  No signs of strep pharyngitis, otitis or abnormal abdominal findings.   CXR pending.  CBC and BMP without acute findings.  Pt given IVF and toradol.  Flu test done in the office today positive for influenza A.  Will continue antipyretica and rest and fluids and return for any further problems.  CXR wnl.  Pt improved after fluids  Final Clinical Impressions(s) / ED Diagnoses   Final diagnoses:  Influenza    New Prescriptions New Prescriptions   HYDROCODONE-HOMATROPINE (HYCODAN) 5-1.5 MG/5ML SYRUP    Take 5 mLs by mouth every 6 (six) hours as needed for cough.   OSELTAMIVIR (TAMIFLU) 75 MG CAPSULE    Take 1 capsule (75 mg total) by mouth every 12 (twelve) hours.     Blanchie Dessert, MD 07/18/16 [id number]

## 2016-07-18 NOTE — Progress Notes (Signed)
Pre visit review using our clinic review tool, if applicable. No additional management support is needed unless otherwise documented below in the visit note/SLS  

## 2016-07-23 ENCOUNTER — Telehealth: Payer: Self-pay | Admitting: Endocrinology

## 2016-07-23 MED ORDER — FREESTYLE FREEDOM LITE W/DEVICE KIT: PACK | 2 refills | Status: DC

## 2016-07-23 MED ORDER — FREESTYLE LANCETS MISC: 2 refills | Status: DC

## 2016-07-23 MED ORDER — FREESTYLE LANCETS MISC: 5 refills | Status: DC

## 2016-07-23 MED ORDER — GLUCOSE BLOOD VI STRP: ORAL_STRIP | 12 refills | Status: DC

## 2016-07-23 NOTE — Telephone Encounter (Signed)
Rx's submitted for lancets, meter and test strips.

## 2016-07-23 NOTE — Telephone Encounter (Signed)
The pharmacy called in about the patient's test strips and lancets, she said that the patient's insurance will be covering the Freestyle Meter, Strips, and Lancets and they need a script for these sent over.

## 2016-07-25 MED FILL — cloNIDine HCL 0.1 MG TABS: 0.1 | 30 days supply | Qty: 60 | Fill #0

## 2016-07-25 MED FILL — ATORVASTATIN 20 MG TABLET: 20 | 30 days supply | Qty: 30 | Fill #0

## 2016-08-07 MED FILL — HYDROXYZINE PAM 25 MG CAP: 25 | 30 days supply | Qty: 90 | Fill #1

## 2016-08-12 MED FILL — LANTUS SOLOSTAR 100 UNITS/M: 100 | 50 days supply | Qty: 15 | Fill #1

## 2016-09-06 ENCOUNTER — Telehealth: Payer: Self-pay | Admitting: *Deleted

## 2016-09-06 MED ORDER — ZOLPIDEM TARTRATE 5 MG PO TABS: 5.0000 mg | ORAL_TABLET | Freq: Every evening | ORAL | 0 refills | Status: DC | PRN

## 2016-09-06 MED FILL — ZOLPIDEM TARTRATE 5 MG TAB: 5 | 30 days supply | Qty: 30 | Fill #0

## 2016-09-06 NOTE — Telephone Encounter (Signed)
Please check that controlled substance contract and UDS are up to date. OK to send #30.

## 2016-09-06 NOTE — Telephone Encounter (Signed)
Pt gave UDS and signed controlled substance contract on 06/25/16.  Last Refill was 06/25/16. Phone refill to Opal Sidles at [facility] at 2:40pm

## 2016-09-06 NOTE — Telephone Encounter (Signed)
Faxed refill request [facility] Zolpidem 5 mg tab Last filled by MD on 06/25/16, #30x0  Faxed refill request received from [facility] for Atorvastatin 20 mg tab Last filled by MD on 11/20/15, #90x1  Last AEX - 06/25/16 [Pt had Acute w/th Percell Miller, then to ED for Influenza 07/18/16]; No future appointment scheduled Next AEX - 3-Mths. Please Advise on refills/SLS 05/11

## 2016-09-10 ENCOUNTER — Ambulatory Visit: Payer: Self-pay | Admitting: Endocrinology

## 2016-09-10 ENCOUNTER — Telehealth: Payer: Self-pay | Admitting: *Deleted

## 2016-09-10 DIAGNOSIS — Z0289 Encounter for other administrative examinations: Secondary | ICD-10-CM

## 2016-09-10 MED ORDER — ATORVASTATIN CALCIUM 20 MG PO TABS: 20.0000 mg | ORAL_TABLET | Freq: Every day | ORAL | 0 refills | Status: DC

## 2016-09-10 MED FILL — ATORVASTATIN 20 MG TABLET: 20 | 90 days supply | Qty: 90 | Fill #0

## 2016-09-10 NOTE — Telephone Encounter (Signed)
Faxed refill request received from [facility] for Atorvastatin 20 mg tab Last filled by MD on 11/19/16, #90x1 Last AEX - 07/18/16 [Acute visit 07/18/16] Next AEX - 3-Mths. Last Lipid Lab - 06/25/16, Normal Refill sent per [facility] refill protocol/SLS   Please call patient and schedule 3-Mth. F/U office visit per provider instructions/SLS 05/15 Thanks.

## 2016-10-01 MED FILL — HUMALOG 100 UNITS/ML KWIKPE: 100 | 50 days supply | Qty: 15 | Fill #1

## 2016-10-01 MED FILL — LANTUS SOLOSTAR 100 UNITS/M: 100 | 50 days supply | Qty: 15 | Fill #2

## 2016-10-06 ENCOUNTER — Other Ambulatory Visit: Payer: Self-pay | Admitting: Family

## 2016-10-07 MED ORDER — CLONIDINE HCL 0.1 MG PO TABS: 0.1000 mg | ORAL_TABLET | Freq: Two times a day (BID) | ORAL | 0 refills | Status: DC

## 2016-10-07 MED FILL — cloNIDine HCL 0.1 MG TABS: 0.1 | 30 days supply | Qty: 60 | Fill #0

## 2016-10-07 NOTE — Telephone Encounter (Signed)
UDS result placed in PCP red folder. Please advise?

## 2016-10-07 NOTE — Telephone Encounter (Signed)
Last zolpidem RX:  09/06/16, #30 Last OV:  06/25/16 Next OV: was due 09/22/16 and is past due UDS: 06/25/16 but result not available in chart. Message sent to have result faxed to nurse station. Awaiting result.

## 2016-10-09 MED ORDER — ZOLPIDEM TARTRATE 5 MG PO TABS: 5.0000 mg | ORAL_TABLET | Freq: Every evening | ORAL | 0 refills | Status: DC | PRN

## 2016-10-09 MED FILL — VALACYCLOVIR HCL 500 MG TAB: 500 | 90 days supply | Qty: 90 | Fill #1

## 2016-10-09 MED FILL — PANTOPRAZOLE SOD DR 40 MG T: 40 | 90 days supply | Qty: 90 | Fill #1

## 2016-10-09 MED FILL — ESCITALOPRAM 20 MG TABLET: 20 | 90 days supply | Qty: 90 | Fill #1

## 2016-10-09 MED FILL — ZOLPIDEM TARTRATE 5 MG TAB: 5 | 30 days supply | Qty: 30 | Fill #0

## 2016-10-09 NOTE — Telephone Encounter (Signed)
PCP printed Rx and it was called to [facility] [place], #30 x no refills.

## 2016-10-14 ENCOUNTER — Telehealth: Payer: Self-pay | Admitting: *Deleted

## 2016-10-14 MED FILL — POTASSIUM CL 10 MEQ TAB SA: 10 | 90 days supply | Qty: 180 | Fill #0

## 2016-10-14 MED FILL — QUETIAPINE FUMARATE 300 MG: 300 | 90 days supply | Qty: 90 | Fill #1

## 2016-10-14 MED FILL — HYDROXYZINE PAM 25 MG CAP: 25 | 30 days supply | Qty: 90 | Fill #2

## 2016-10-14 NOTE — Telephone Encounter (Signed)
Can they refill now with proper med please? Also, lets have him return to the lab for a bmet. (dx hypokalemia).

## 2016-10-14 NOTE — Telephone Encounter (Signed)
Please get this reach out to this patient to schedule visit with [facility] in august

## 2016-10-14 NOTE — Telephone Encounter (Signed)
Received call from [facility] at [facility] wanting to let us know that the last time patient picked up Potassium refill he was incorrectly given Potassium Citrate. They will notify pt and correct medication at his upcoming refill. Please advise?

## 2016-10-15 NOTE — Telephone Encounter (Signed)
Left message for pt to return my call.

## 2016-10-16 NOTE — Telephone Encounter (Signed)
Spoke with pt regarding below recommendation. Pt voices understanding and states he is due for a routine follow up with PCP anyway. Scheduled appt with [name] on 10/23/16 at 10:30am and will recheck potassium at that time.

## 2016-10-22 NOTE — Telephone Encounter (Signed)
Message left on patients voicemail for him to contact the office to schedule appointment with [facility] in August.

## 2016-10-23 ENCOUNTER — Ambulatory Visit: Payer: 59 | Admitting: Family

## 2016-10-31 ENCOUNTER — Other Ambulatory Visit: Payer: Self-pay | Admitting: Family

## 2016-10-31 MED FILL — ULTICARE PEN NDL 8MM 31G: 31G X 8 MM | 90 days supply | Qty: 100 | Fill #0

## 2016-10-31 MED FILL — AMLODIPINE BESYLATE 10 MG T: 10 | 90 days supply | Qty: 90 | Fill #1

## 2016-11-12 ENCOUNTER — Encounter: Payer: Self-pay | Admitting: Family

## 2016-11-12 ENCOUNTER — Other Ambulatory Visit: Payer: Self-pay | Admitting: Family

## 2016-11-12 ENCOUNTER — Ambulatory Visit (INDEPENDENT_AMBULATORY_CARE_PROVIDER_SITE_OTHER): Payer: 59 | Admitting: Family

## 2016-11-12 VITALS — BP 101/84 | HR 110 | Temp 98.5°F | Resp 18 | Ht 70.0 in | Wt 215.4 lb

## 2016-11-12 DIAGNOSIS — G4700 Insomnia, unspecified: Secondary | ICD-10-CM | POA: Diagnosis not present

## 2016-11-12 DIAGNOSIS — IMO0001 Reserved for inherently not codable concepts without codable children: Secondary | ICD-10-CM

## 2016-11-12 DIAGNOSIS — F1010 Alcohol abuse, uncomplicated: Secondary | ICD-10-CM | POA: Diagnosis not present

## 2016-11-12 DIAGNOSIS — E1165 Type 2 diabetes mellitus with hyperglycemia: Secondary | ICD-10-CM

## 2016-11-12 DIAGNOSIS — Z79899 Other long term (current) drug therapy: Secondary | ICD-10-CM | POA: Diagnosis not present

## 2016-11-12 DIAGNOSIS — F418 Other specified anxiety disorders: Secondary | ICD-10-CM | POA: Diagnosis not present

## 2016-11-12 LAB — COMPREHENSIVE METABOLIC PANEL
ALBUMIN: 4.3 g/dL (ref 3.5–5.2)
ALT: 61 U/L — AB (ref 0–53)
AST: 46 U/L — AB (ref 0–37)
Alkaline Phosphatase: 90 U/L (ref 39–117)
BILIRUBIN TOTAL: 0.5 mg/dL (ref 0.2–1.2)
BUN: 6 mg/dL (ref 6–23)
CALCIUM: 9.8 mg/dL (ref 8.4–10.5)
CHLORIDE: 104 meq/L (ref 96–112)
CO2: 28 meq/L (ref 19–32)
CREATININE: 0.84 mg/dL (ref 0.40–1.50)
GFR: 130.1 mL/min (ref >60.00)
Glucose, Bld: 183 mg/dL — ABNORMAL HIGH (ref 70–99)
Potassium: 3.5 mEq/L (ref 3.5–5.1)
SODIUM: 139 meq/L (ref 135–145)
Total Protein: 7.1 g/dL (ref 6.0–8.3)

## 2016-11-12 MED ORDER — ZOLPIDEM TARTRATE 5 MG PO TABS
5.0000 mg | ORAL_TABLET | Freq: Every evening | ORAL | 0 refills | Status: DC | PRN
Start: 2016-11-12 — End: 2016-12-25

## 2016-11-12 MED ORDER — ATORVASTATIN CALCIUM 20 MG PO TABS: 20.0000 mg | ORAL_TABLET | Freq: Every day | ORAL | 1 refills | Status: DC

## 2016-11-12 MED ORDER — CLONIDINE HCL 0.1 MG PO TABS: 0.1000 mg | ORAL_TABLET | Freq: Three times a day (TID) | ORAL | 1 refills | Status: DC

## 2016-11-12 MED ORDER — INSULIN PEN NEEDLE 31G X 8 MM MISC: 1 refills | Status: DC

## 2016-11-12 MED ORDER — HYDROXYZINE HCL 50 MG PO TABS: 50.0000 mg | ORAL_TABLET | Freq: Three times a day (TID) | ORAL | 1 refills | Status: DC | PRN

## 2016-11-12 NOTE — Assessment & Plan Note (Signed)
Discussed continuing AA meetings weekly. Refills provided for clonidine and hydralazine.

## 2016-11-12 NOTE — Assessment & Plan Note (Signed)
Stable on ambien, continue same, obtain UDS today.

## 2016-11-12 NOTE — Assessment & Plan Note (Signed)
Obtain follow up A1C, last A1C was very high.continue insulin, will adjust meds pending review of A1c.

## 2016-11-12 NOTE — Progress Notes (Signed)
Subjective:    Patient ID: Willie [name], male    DOB: 11-18-1975, 41 y.o.   MRN: [id number]  HPI  Mr. Skelly is a 41 yr old male who presents today for follow up of multiple medical problems:  1) HTN- maintained on lisinopril.  BP Readings from Last 3 Encounters:  11/12/16 101/84  07/18/16 109/90  07/18/16 119/82   2) DM2- Reports that his sugars were running high due to dietary indiscretion.  He is using 5, 15 and 10 units of short acting, was taking 35 units.  Denies hypoglycemia.  Not checking recently.   Lab Results  Component Value Date   HGBA1C 10.2 (H) 06/25/2016   HGBA1C 12.6 Repeated and verified X2. (H) 09/13/2015   HGBA1C 10.7 (H) 03/21/2015   Lab Results  Component Value Date   MICROALBUR 1.0 05/24/2014   LDLCALC 55 06/25/2016   CREATININE 1.00 07/18/2016   3) Depression- was working with a Social worker.  Was too costly.  He attends AA once a week.    4) Hx of ETOH abuse-  Reports that his last drink was 3 weeks ago. Reports that he went on a 1 week "binge" at the anniversary of his brother's death.  Since that time he reports that he is doing better. He increased his hydralazine to 23m TID which was the dose he was taking in rehab and continues clonidine 0.117mTID. Reports these meds help his cravings.  5) Insomnia- continues am[place] he finds helpful for his insomnai.   Review of Systems See HPI  Past Medical History:  Diagnosis Date  . Alcoholic pancreatitis    recurrent  . Depression   . Diabetes mellitus, type II ([place]   New Onset 03/2010  . GERD (gastroesophageal reflux disease)   . High cholesterol   . 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 Hypertension   . Sleep apnea      Social History   Social History  . Marital status: Married    Spouse name: N/A  . Number of children: 2  . Years of education: N/A   Occupational History  . GENERAL MANAGER [employer]    Social History Main Topics  . Smoking status: Current Every Day Smoker    Types: Cigarettes    Start date: 04/29/2016  . Smokeless tobacco: Never Used     Comment: 4 Cigarettes a day   . Alcohol use No     Comment: 3 weeks ago, last drink  . Drug use: No  . Sexual activity: Not on file   Other Topics Concern  . 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 LUMBAR MICRODISCECTOMY  ~ 2004   Dr ElEllene Route  Family History  Problem Relation Age of Onset  . Diabetes Mother   . Lung cancer Brother        twin brother  . Pancreatic cancer Paternal Aunt   . Colon cancer Neg Hx   . Stomach cancer Neg Hx     Allergies  Allergen Reactions  . Invokamet [Canagliflozin-Metformin Hcl]  Other (See Comments)    Lactic acidosis  . Metformin And Related     Drastic drop in blood sugar    Current Outpatient Prescriptions on File Prior to Visit  Medication Sig Dispense Refill  . amLODipine (NORVASC) 10 MG tablet Take 1 tablet (10 mg total) by mouth daily. 90 tablet 1  . atorvastatin (LIPITOR) 20 MG tablet Take 1 tablet (20 mg total) by mouth daily. 90 tablet 0  . Blood Glucose Monitoring Suppl (FREESTYLE FREEDOM LITE) w/Device KIT 2 (two) times daily 1 each 2  . escitalopram (LEXAPRO) 20 MG tablet Take 1 tablet (20 mg total) by mouth daily. 90 tablet 1  . glucose blood (FREESTYLE LITE) test strip Use as instructed 100 each 12  . HYDROcodone-homatropine (HYCODAN) 5-1.5 MG/5ML syrup Take 5 mLs by mouth every 6 (six) hours as needed for cough. 120 mL 0  . hydrOXYzine (VISTARIL) 25 MG capsule Take 1 capsule (25 mg total) by mouth 3 (three) times daily as needed. (Patient taking differently: Take 50 mg by mouth 3 (three) times daily as needed. ) 90 capsule 2  .  Insulin Glargine (BASAGLAR KWIKPEN) 100 UNIT/ML SOPN Inject 0.35 mLs (35 Units total) into the skin at bedtime. 10 pen 3  . insulin lispro (HUMALOG KWIKPEN) 100 UNIT/ML KiwkPen 3 times a day (just before each meal) 09-10-08 units, 15 mL 4  . Lancets (FREESTYLE) lancets 2 (two) times daily 200 each 5  . Lancets (FREESTYLE) lancets 2 (two) times daily 200 each 2  . lisinopril (PRINIVIL,ZESTRIL) 20 MG tablet TAKE 1 TABLET (20 MG TOTAL) BY MOUTH DAILY. 90 tablet 1  . Multiple Vitamins-Minerals (MULTIVITAMIN) tablet Take 1 tablet by mouth daily. 30 tablet 0  . oseltamivir (TAMIFLU) 75 MG capsule Take 1 capsule (75 mg total) by mouth every 12 (twelve) hours. 10 capsule 0  . pantoprazole (PROTONIX) 40 MG tablet Take 1 tablet (40 mg total) by mouth daily. 90 tablet 1  . potassium chloride (KLOR-CON M10) 10 MEQ tablet Take 2 tablets (20 mEq total) by mouth daily. 180 tablet 1  . QUEtiapine (SEROQUEL) 300 MG tablet Take 1 tablet (300 mg total) by mouth at bedtime. 90 tablet 1  . tadalafil (CIALIS) 10 MG tablet Take 1 tablet (10 mg total) by mouth daily as needed for erectile dysfunction. 10 tablet 5  . thiamine 100 MG tablet Take 1 tablet (100 mg total) by mouth daily. 90 tablet 1  . ULTICARE SHORT PEN NEEDLES 31G X 8 MM MISC USE ONCE A DAY TO INJECT INSULIN 100 each 0  . valACYclovir (VALTREX) 500 MG tablet TAKE 1 TABLET (500 MG TOTAL) BY MOUTH DAILY. 90 tablet 1   No current facility-administered medications on file prior to visit.     BP 101/84 (BP Location: Left Arm, Cuff Size: Large)   Pulse (!) 110   Temp 98.5 F (36.9 C) (Oral)   Resp 18   Ht _0  (1.778 m)   Wt 215 lb 6.4 oz (97.7 kg)   SpO2 100%   BMI 30.91 kg/m       Objective:   Physical Exam  Constitutional: He is oriented to person, place, and time. He appears well-developed and well-nourished. No distress.  HENT:  Head: Normocephalic and atraumatic.  Cardiovascular: Normal rate and regular rhythm.   No murmur  heard. Pulmonary/Chest: Effort normal and breath sounds normal. No respiratory distress. He has no wheezes. He has no rales.  Musculoskeletal: He exhibits no edema.  Neurological: He is alert  and oriented to person, place, and time.  Skin: Skin is warm and dry.  Psychiatric: He has a normal mood and affect. His behavior is normal. Thought content normal.          Assessment & Plan:

## 2016-11-12 NOTE — Assessment & Plan Note (Addendum)
Scored 3 on PHQ-9.  Stable, continue seroquel.

## 2016-11-12 NOTE — Patient Instructions (Signed)
Please complete lab work prior to leaving.   

## 2016-11-13 ENCOUNTER — Other Ambulatory Visit: Payer: Self-pay | Admitting: Family

## 2016-11-13 ENCOUNTER — Encounter: Payer: Self-pay | Admitting: Family

## 2016-11-13 LAB — MICROALBUMIN / CREATININE URINE RATIO

## 2016-11-13 LAB — HEMOGLOBIN A1C
Hgb A1c MFr Bld: 7.9 % — ABNORMAL HIGH (ref <5.7)
Mean Plasma Glucose: 180 mg/dL

## 2016-11-13 MED ORDER — BASAGLAR KWIKPEN 100 UNIT/ML ~~LOC~~ SOPN: 40.0000 [IU] | PEN_INJECTOR | Freq: Every day | SUBCUTANEOUS | 3 refills | Status: DC

## 2016-11-13 MED FILL — hydrOXYzine HCL 50 MG TABS: 50 | 90 days supply | Qty: 270 | Fill #0

## 2016-11-20 ENCOUNTER — Telehealth: Payer: Self-pay | Admitting: Family

## 2016-11-20 MED FILL — cloNIDine HCL 0.1 MG TABS: 0.1 | 90 days supply | Qty: 270 | Fill #0

## 2016-11-20 MED FILL — ZOLPIDEM TARTRATE 5 MG TAB: 5 | 30 days supply | Qty: 30 | Fill #0

## 2016-11-21 NOTE — Telephone Encounter (Addendum)
Relation to TU:UEKC Call back number:[phone] Pharmacy:  [place], [place] - [address] [phone] (Phone) [phone] (Fax)     Reason for call:  Patient states Insulin Glargine [facility]) 100 UNIT/ML SOPN is not covered and would like "lancets" refilled patient states he will run out of insulin and would like to discuss, patient states this has been [place] since he's in been in the office on 11/12/16,  please advise

## 2016-11-22 ENCOUNTER — Other Ambulatory Visit: Payer: Self-pay | Admitting: Emergency Medicine

## 2016-11-22 MED ORDER — INSULIN GLARGINE 100 UNIT/ML SOLOSTAR PEN: 40.0000 [IU] | PEN_INJECTOR | Freq: Every day | SUBCUTANEOUS | 1 refills | Status: DC

## 2016-11-22 MED FILL — LANTUS SOLOSTAR 100 UNITS/M: 100 | 38 days supply | Qty: 15 | Fill #0

## 2016-11-22 NOTE — Telephone Encounter (Signed)
Refill sent as requested. 

## 2016-12-10 ENCOUNTER — Encounter: Payer: Self-pay | Admitting: Medical

## 2016-12-10 ENCOUNTER — Ambulatory Visit (INDEPENDENT_AMBULATORY_CARE_PROVIDER_SITE_OTHER): Payer: 59 | Admitting: Medical

## 2016-12-10 VITALS — BP 137/91 | HR 119 | Temp 98.4°F | Resp 16 | Ht 70.0 in | Wt 214.0 lb

## 2016-12-10 DIAGNOSIS — R197 Diarrhea, unspecified: Secondary | ICD-10-CM

## 2016-12-10 NOTE — Patient Instructions (Addendum)
Your  GI symptoms are better now. Based on your clinical improvement I don't think labs are necessary. Please don't use any supplements without seeking advise of your pcp first.   Recent a1c good/better than prior continue current regimen.  Continue to abstain from alcohol.   Providing work note asking employer to please excuse past week abscenses due to brief illness.  Follow up as regularly scheduled with pcp or as needed.

## 2016-12-10 NOTE — Progress Notes (Signed)
Subjective:    Patient ID: Willie [name], male    DOB: Aug 30, 1975, 41 y.o.   MRN: [id number]  HPI  Pt in for evaluation.  Pt states recently he tried otc supplament to help build muscles but his sugars were up and down when on supplament. He states he ate less with supplement shakes and sugar were down to 60(only one time). But he stopped supplament and he states feels better. Pt a1c 4 weeks ago was better. Was 7.9 just recently. Last cmp looked ok. Mild liver enzyme elevation. But hx of alcohol abuse. Pt endocrinologist manages his diabetes. Recently when checks sugars are in low 100's.  He states about a week ago he had 2 days of loose stools around time he started muscle building supplament.. This was for 2 days last week 2 days in a row. He states Saturday his bm normalized. Pt states late last week had brief episodes of severe reflux/almost vomiting but did not.   He states overall he feels great now but he does need work note for weekend/days he missed.   He states he missed 4 days of work past week.   FSBS 112 this am.  Review of Systems  Constitutional: Negative for chills and fatigue.  Respiratory: Negative for cough, chest tightness, shortness of breath and wheezing.   Cardiovascular: Negative for chest pain and palpitations.  Gastrointestinal: Negative for abdominal pain, constipation, diarrhea, nausea and vomiting.  Endocrine: Negative for polydipsia, polyphagia and polyuria.  Musculoskeletal: Negative for back pain.  Skin: Negative for rash.  Neurological: Negative for dizziness, syncope, weakness, numbness and headaches.  Hematological: Negative for adenopathy. Does not bruise/bleed easily.  Psychiatric/Behavioral: Negative for behavioral problems, confusion, self-injury and sleep disturbance.    Past Medical History:  Diagnosis Date  . Alcoholic pancreatitis    recurrent  . Depression   . Diabetes mellitus, type II ([place])    New Onset 03/2010  . GERD  (gastroesophageal reflux disease)   . High cholesterol   . 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 Hypertension   . Sleep apnea      Social History   Social History  . Marital status: Married    Spouse name: N/A  . Number of children: 2  . Years of education: N/A   Occupational History  . 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 Smoking status: Current Every Day Smoker    Types: Cigarettes    Start date: 04/29/2016  . Smokeless tobacco: Never Used     Comment: 4 Cigarettes a day   . Alcohol use No     Comment: 3 weeks ago, last drink  . Drug use: No  . Sexual activity: Not on file   Other Topics Concern  . 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 LUMBAR MICRODISCECTOMY  ~ 2004   Dr Ellene Route    Family History  Problem Relation Age of Onset  . Diabetes Mother   . Lung cancer Brother        twin brother  .  Pancreatic cancer Paternal Aunt   . Colon cancer Neg Hx   . Stomach cancer Neg Hx     Allergies  Allergen Reactions  . Invokamet [Canagliflozin-Metformin Hcl] Other (See Comments)    Lactic acidosis  . Metformin And Related     Drastic drop in blood sugar    Current Outpatient Prescriptions on File Prior to Visit  Medication Sig Dispense Refill  . amLODipine (NORVASC) 10 MG tablet Take 1 tablet (10 mg total) by mouth daily. 90 tablet 1  . atorvastatin (LIPITOR) 20 MG tablet Take 1 tablet (20 mg total) by mouth daily. 90 tablet 1  . Blood Glucose Monitoring Suppl (FREESTYLE FREEDOM LITE) w/Device KIT 2 (two) times daily 1 each 2  . cloNIDine (CATAPRES) 0.1 MG tablet Take 1 tablet (0.1 mg total) by mouth 3 (three) times daily. 270  tablet 1  . escitalopram (LEXAPRO) 20 MG tablet Take 1 tablet (20 mg total) by mouth daily. 90 tablet 1  . glucose blood (FREESTYLE LITE) test strip Use as instructed 100 each 12  . HYDROcodone-homatropine (HYCODAN) 5-1.5 MG/5ML syrup Take 5 mLs by mouth every 6 (six) hours as needed for cough. 120 mL 0  . hydrOXYzine (ATARAX/VISTARIL) 50 MG tablet Take 1 tablet (50 mg total) by mouth 3 (three) times daily as needed. 270 tablet 1  . hydrOXYzine (VISTARIL) 25 MG capsule Take 1 capsule (25 mg total) by mouth 3 (three) times daily as needed. (Patient taking differently: Take 50 mg by mouth 3 (three) times daily as needed. ) 90 capsule 2  . Insulin Glargine (LANTUS SOLOSTAR) 100 UNIT/ML Solostar Pen Inject 40 Units into the skin daily at 10 pm. 15 mL 1  . insulin lispro (HUMALOG KWIKPEN) 100 UNIT/ML KiwkPen 3 times a day (just before each meal) 09-10-08 units, 15 mL 4  . Insulin Pen Needle (ULTICARE SHORT PEN NEEDLES) 31G X 8 MM MISC Use once daily to inject insulin. 100 each 1  . Lancets (FREESTYLE) lancets 2 (two) times daily 200 each 5  . Lancets (FREESTYLE) lancets 2 (two) times daily 200 each 2  . lisinopril (PRINIVIL,ZESTRIL) 20 MG tablet TAKE 1 TABLET (20 MG TOTAL) BY MOUTH DAILY. 90 tablet 1  . Multiple Vitamins-Minerals (MULTIVITAMIN) tablet Take 1 tablet by mouth daily. 30 tablet 0  . oseltamivir (TAMIFLU) 75 MG capsule Take 1 capsule (75 mg total) by mouth every 12 (twelve) hours. 10 capsule 0  . pantoprazole (PROTONIX) 40 MG tablet Take 1 tablet (40 mg total) by mouth daily. 90 tablet 1  . potassium chloride (KLOR-CON M10) 10 MEQ tablet Take 2 tablets (20 mEq total) by mouth daily. 180 tablet 1  . QUEtiapine (SEROQUEL) 300 MG tablet Take 1 tablet (300 mg total) by mouth at bedtime. 90 tablet 1  . tadalafil (CIALIS) 10 MG tablet Take 1 tablet (10 mg total) by mouth daily as needed for erectile dysfunction. 10 tablet 5  . thiamine 100 MG tablet Take 1 tablet (100 mg total) by mouth daily. 90  tablet 1  . valACYclovir (VALTREX) 500 MG tablet TAKE 1 TABLET (500 MG TOTAL) BY MOUTH DAILY. 90 tablet 1  . zolpidem (AMBIEN) 5 MG tablet Take 1 tablet (5 mg total) by mouth at bedtime as needed. for sleep 30 tablet 0   No current facility-administered medications on file prior to visit.     BP (!) 137/91   Pulse (!) 119   Temp 98.4 F (36.9 C) (Oral)   Resp 16   Ht 5'  10" (1.778 m)   Wt 214 lb (97.1 kg)   SpO2 100%   BMI 30.71 kg/m       Objective:   Physical Exam  General Appearance- Not in acute distress.  HEENT Eyes- Scleraeral/Conjuntiva-bilat- Not Yellow. Mouth & Throat- Normal.  Chest and Lung Exam Auscultation: Breath sounds:-Normal. Adventitious sounds:- No Adventitious sounds.  Cardiovascular Auscultation:Rythm - Regular. Heart Sounds -Normal heart sounds.  Abdomen Inspection:-Inspection Normal.  Palpation/Perucssion: Palpation and Percussion of the abdomen reveal- Non Tender, No Rebound tenderness, No rigidity(Guarding) and No Palpable abdominal masses.  Liver:-Normal.  Spleen:- Normal.   Back- no cva tenderness.      Assessment & Plan:  Your symptoms are better now. Based on your clinical improvement I don't think labs are necessary. Please don't use any supplements without seeking advise of your pcp first.   Recent a1c good/better than prior continue current regimen.  Continue to abstain from alcohol.   Providing work note asking employer to please excuse past week abscenses due to brief illness.  Follow up as regularly scheduled with pcp or as needed.  Not on review pt pulse high today. He does tend to run high pulse. Last time 110. Pt has not take his bp meds including clonidine yet today.Harvie Heck, Percell Miller, PA-C

## 2016-12-20 MED FILL — ATORVASTATIN 20 MG TABLET: 20 | 90 days supply | Qty: 90 | Fill #0

## 2016-12-20 MED FILL — ULTICARE PEN NDL 8MM 31G: 31G X 8 MM | 100 days supply | Qty: 100 | Fill #0

## 2016-12-24 MED FILL — LISINOPRIL 20 MG TABS: 20 | 90 days supply | Qty: 90 | Fill #1

## 2016-12-25 ENCOUNTER — Telehealth: Payer: Self-pay | Admitting: Family

## 2016-12-25 ENCOUNTER — Other Ambulatory Visit: Payer: Self-pay

## 2016-12-25 MED ORDER — ZOLPIDEM TARTRATE 5 MG PO TABS: 5.0000 mg | ORAL_TABLET | Freq: Every evening | ORAL | 0 refills | Status: DC | PRN

## 2016-12-25 NOTE — Telephone Encounter (Signed)
Relation to KA:JGOT Call back [phone]   Reason for call:  Patient requesting a refill zolpidem (AMBIEN) 5 MG tablet, please send to   [place], [place] - [place] [phone] (Phone) [phone] (Fax)

## 2016-12-26 MED FILL — ZOLPIDEM TARTRATE 5 MG TAB: 5 | 30 days supply | Qty: 30 | Fill #0

## 2017-01-08 MED FILL — LANTUS SOLOSTAR 100 UNITS/M: 100 | 38 days supply | Qty: 15 | Fill #1

## 2017-01-15 ENCOUNTER — Other Ambulatory Visit: Payer: Self-pay | Admitting: Family

## 2017-01-15 MED FILL — ESCITALOPRAM 20 MG TABLET: 20 | 90 days supply | Qty: 90 | Fill #0

## 2017-01-15 MED FILL — PANTOPRAZOLE SOD DR 40 MG T: 40 | 90 days supply | Qty: 90 | Fill #0

## 2017-01-15 MED FILL — HUMALOG 100 UNITS/ML KWIKPE: 100 | 50 days supply | Qty: 15 | Fill #2

## 2017-01-15 MED FILL — VALACYCLOVIR HCL 500 MG TAB: 500 | 90 days supply | Qty: 90 | Fill #0

## 2017-01-15 MED FILL — AMLODIPINE BESYLATE 10 MG T: 10 | 90 days supply | Qty: 90 | Fill #0

## 2017-01-15 MED FILL — QUETIAPINE FUMARATE 300 MG: 300 | 90 days supply | Qty: 90 | Fill #0

## 2017-01-27 ENCOUNTER — Encounter: Payer: Self-pay | Admitting: Medical

## 2017-01-27 ENCOUNTER — Ambulatory Visit (INDEPENDENT_AMBULATORY_CARE_PROVIDER_SITE_OTHER): Payer: 59 | Admitting: Medical

## 2017-01-27 ENCOUNTER — Telehealth: Payer: 59 | Admitting: Family

## 2017-01-27 VITALS — BP 123/83 | HR 107 | Temp 98.2°F | Ht 70.0 in | Wt 211.2 lb

## 2017-01-27 DIAGNOSIS — R112 Nausea with vomiting, unspecified: Secondary | ICD-10-CM

## 2017-01-27 DIAGNOSIS — R109 Unspecified abdominal pain: Secondary | ICD-10-CM

## 2017-01-27 DIAGNOSIS — R197 Diarrhea, unspecified: Secondary | ICD-10-CM

## 2017-01-27 MED ORDER — ONDANSETRON HCL 4 MG PO TABS: 4.0000 mg | ORAL_TABLET | Freq: Three times a day (TID) | ORAL | 0 refills | Status: DC | PRN

## 2017-01-27 MED FILL — ONDANSETRON HCL 4 MG TABLET: 4 | 7 days supply | Qty: 20 | Fill #0

## 2017-01-27 NOTE — Progress Notes (Signed)
Thank you for the details you included in the comment boxes. Those details are very helpful in determining the best course of treatment for you and help us to provide the best care.  We are sorry that you are not feeling well. Here is how we plan to help!  Based on what you have shared with me it looks like you have a Virus that is irritating your GI tract.  Vomiting is the forceful emptying of a portion of the stomach's content through the mouth.  Although nausea and vomiting can make you feel miserable, it's important to remember that these are not diseases, but rather symptoms of an underlying illness.  When we treat short term symptoms, we always caution that any symptoms that persist should be fully evaluated in a medical office.  I have prescribed a medication that will help alleviate your symptoms and allow you to stay hydrated:  Zofran 4 mg 1 tablet every 8 hours as needed for nausea and vomiting  HOME CARE:  Drink clear liquids.  This is very important! Dehydration (the lack of fluid) can lead to a serious complication.  Start off with 1 tablespoon every 5 minutes for 8 hours.  You may begin eating bland foods after 8 hours without vomiting.  Start with saltine crackers, white bread, rice, mashed potatoes, applesauce.  After 48 hours on a bland diet, you may resume a normal diet.  Try to go to sleep.  Sleep often empties the stomach and relieves the need to vomit.  GET HELP RIGHT AWAY IF:   Your symptoms do not improve or worsen within 2 days after treatment.  You have a fever for over 3 days.  You cannot keep down fluids after trying the medication.  MAKE SURE YOU:   Understand these instructions.  Will watch your condition.  Will get help right away if you are not doing well or get worse.   Thank you for choosing an e-visit. Your e-visit answers were reviewed by a board certified advanced clinical practitioner to complete your personal care plan. Depending upon the  condition, your plan could have included both over the counter or prescription medications. Please review your pharmacy choice. Be sure that the pharmacy you have chosen is open so that you can pick up your prescription now.  If there is a problem you may message your provider in MyChart to have the prescription routed to another pharmacy. Your safety is important to us. If you have drug allergies check your prescription carefully.  For the next 24 hours, you can use MyChart to ask questions about today's visit, request a non-urgent call back, or ask for a work or school excuse from your e-visit provider. You will get an e-mail in the next two days asking about your experience. I hope that your e-visit has been valuable and will speed your recovery.    

## 2017-01-27 NOTE — Progress Notes (Signed)
Subjective:    Patient ID: Willie [name], male    DOB: 1976/01/17, 41 y.o.   MRN: [id number]  HPI  Pt in with recent nausea, vomiting and diarrhea. Pt symptoms have been going on for about one week.   Pt states he did found old rx of zofran today. Pt has not eating much at all or drinking except water.   Pt had evisit today and advised conservative measures.  Pt daughter was sick before he got sick. She was only sick for 2 days and then she got better quickly.  Pt had loose stools 4 times a day for about 4 days. He stopped eating for about 3 days. Then solid one twice today. He has been hydrating over past 3 days.     Review of Systems  Respiratory: Negative for cough, chest tightness, shortness of breath and wheezing.   Cardiovascular: Negative for chest pain.  Gastrointestinal: Positive for diarrhea, nausea and vomiting. Negative for abdominal distention, abdominal pain and constipation.       Early on pt had some abdomen pain.  Genitourinary: Negative for dysuria, flank pain and frequency.  Musculoskeletal: Negative for back pain, joint swelling and myalgias.  Skin: Negative for rash.  Neurological: Negative for dizziness, seizures, facial asymmetry, weakness and numbness.  Hematological: Negative for adenopathy.  Psychiatric/Behavioral: Negative for behavioral problems, confusion, dysphoric mood and sleep disturbance.   Past Medical History:  Diagnosis Date  . Alcoholic pancreatitis    recurrent  . Depression   . Diabetes mellitus, type II ([place])    New Onset 03/2010  . GERD (gastroesophageal reflux disease)   . High cholesterol   . 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 Hypertension   . Sleep apnea      Social History   Social History  . Marital status: Married    Spouse name: N/A  . Number of children: 2  . Years of education: N/A   Occupational History  . GENERAL MANAGER [employer]   Social History Main Topics  . Smoking status: Current Every Day Smoker    Types: Cigarettes    Start date: 04/29/2016  . Smokeless tobacco: Never Used     Comment: 4 Cigarettes a day   . Alcohol use No     Comment: 3 weeks ago, last drink  . Drug use: No  . Sexual activity: Not on file   Other Topics Concern  . 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 LUMBAR MICRODISCECTOMY  ~ 2004   Dr Ellene Route    Family History  Problem Relation Age of Onset  . Diabetes Mother   . Lung cancer Brother        twin brother  . Pancreatic cancer Paternal Aunt   . Colon cancer Neg Hx   . Stomach cancer Neg Hx     Allergies  Allergen Reactions  . Invokamet [Canagliflozin-Metformin Hcl] Other (See Comments)    Lactic acidosis  . Metformin And Related     Drastic drop in blood sugar    Current Outpatient Prescriptions  on File Prior to Visit  Medication Sig Dispense Refill  . amLODipine (NORVASC) 10 MG tablet TAKE 1 TABLET BY MOUTH DAILY 90 tablet 1  . atorvastatin (LIPITOR) 20 MG tablet Take 1 tablet (20 mg total) by mouth daily. 90 tablet 1  . Blood Glucose Monitoring Suppl (FREESTYLE FREEDOM LITE) w/Device KIT 2 (two) times daily 1 each 2  . cloNIDine (CATAPRES) 0.1 MG tablet Take 1 tablet (0.1 mg total) by mouth 3 (three) times daily. 270 tablet 1  . escitalopram (LEXAPRO) 20 MG tablet TAKE 1 TABLET BY MOUTH DAILY 90 tablet 1  . glucose blood (FREESTYLE LITE) test strip Use as instructed 100 each 12  . hydrOXYzine (ATARAX/VISTARIL) 50 MG tablet Take 1 tablet (50 mg total) by mouth 3 (three) times daily as needed. 270 tablet 1  . Insulin Glargine (LANTUS SOLOSTAR) 100 UNIT/ML Solostar Pen Inject 40 Units into the skin daily at 10 pm. 15 mL 1    . insulin lispro (HUMALOG KWIKPEN) 100 UNIT/ML KiwkPen 3 times a day (just before each meal) 09-10-08 units, 15 mL 4  . Insulin Pen Needle (ULTICARE SHORT PEN NEEDLES) 31G X 8 MM MISC Use once daily to inject insulin. 100 each 1  . Lancets (FREESTYLE) lancets 2 (two) times daily 200 each 5  . lisinopril (PRINIVIL,ZESTRIL) 20 MG tablet TAKE 1 TABLET (20 MG TOTAL) BY MOUTH DAILY. 90 tablet 1  . Multiple Vitamins-Minerals (MULTIVITAMIN) tablet Take 1 tablet by mouth daily. 30 tablet 0  . pantoprazole (PROTONIX) 40 MG tablet TAKE 1 TABLET BY MOUTH DAILY 90 tablet 1  . potassium chloride (K-DUR,KLOR-CON) 10 MEQ tablet TAKE 2 TABLETS (20 MEQ TOTAL) BY MOUTH DAILY. 180 tablet 1  . QUEtiapine (SEROQUEL) 300 MG tablet TAKE 1 TABLET BY MOUTH AT BEDTIME 90 tablet 1  . tadalafil (CIALIS) 10 MG tablet Take 1 tablet (10 mg total) by mouth daily as needed for erectile dysfunction. 10 tablet 5  . thiamine 100 MG tablet Take 1 tablet (100 mg total) by mouth daily. 90 tablet 1  . valACYclovir (VALTREX) 500 MG tablet TAKE 1 TABLET BY MOUTH DAILY 90 tablet 1  . zolpidem (AMBIEN) 5 MG tablet Take 1 tablet (5 mg total) by mouth at bedtime as needed. for sleep 30 tablet 0   No current facility-administered medications on file prior to visit.     BP 123/83 (BP Location: Right Arm, Patient Position: Sitting, Cuff Size: Normal)   Pulse (!) 107   Temp 98.2 F (36.8 C) (Oral)   Ht '5\' 10"'  (1.778 m)   Wt 211 lb 3.2 oz (95.8 kg)   SpO2 100%   BMI 30.30 kg/m       Objective:   Physical Exam  General Appearance- Not in acute distress.  HEENT Eyes- Scleraeral/Conjuntiva-bilat- Not Yellow. Mouth & Throat- Normal.  Chest and Lung Exam Auscultation: Breath sounds:-Normal. CTA.  Adventitious sounds:- No Adventitious sounds.  Cardiovascular Auscultation:Rythm - Regular. Heart Sounds -Normal heart sounds.  Abdomen Inspection:-Inspection Normal.  Palpation/Perucssion: Palpation and Percussion of the abdomen  reveal- Non Tender, No Rebound tenderness, No rigidity(Guarding) and No Palpable abdominal masses.  Liver:-Normal.  Spleen:- Normal.   Back- no cva tenderness. .     Assessment & Plan:  You probably had viral gastrointestinal illness over the past week. However once you start eating normal if your loose stools persist/reoccur then would recommend turning in the stool panels to workup other infectious causes.   Recommend start bland foods,(branch, crackers, and soups. ) Avoid meats,  fruits juices and dairy products. Continue zofran if needed  Try to hydrate with propel fitness water over the next 3-4 days.   Please get labs today. If over the next day you have formed stools then you will not need to do stool panel kit studies.   Return to work note written with explanation that you have been sick for one week.  Follow-up in one week or as needed.

## 2017-01-27 NOTE — Patient Instructions (Addendum)
You probably had viral gastrointestinal illness over the past week. However once you start eating normal if your loose stools persist/reoccur then would recommend turning in the stool panels to workup other infectious causes.   Recommend start bland foods,(branch, crackers, and soups. ) Avoid meats, fruits juices and dairy products. Continue zofran if needed  Try to hydrate with propel fitness water over the next 3-4 days.   Please get labs today. If over the next day you have formed stools then you will not need to do stool panel kit studies.   Return to work note written with explanation that you have been sick for one week.  Follow-up in one week or as needed.

## 2017-01-28 ENCOUNTER — Telehealth: Payer: Self-pay | Admitting: Medical

## 2017-01-28 DIAGNOSIS — E871 Hypo-osmolality and hyponatremia: Secondary | ICD-10-CM

## 2017-01-28 LAB — CBC WITH DIFFERENTIAL/PLATELET
Basophils Absolute: 0.1 10*3/uL (ref 0.0–0.1)
Basophils Relative: 1 % (ref 0.0–3.0)
EOS PCT: 0.8 % (ref 0.0–5.0)
Eosinophils Absolute: 0.1 10*3/uL (ref 0.0–0.7)
HEMATOCRIT: 45.9 % (ref 39.0–52.0)
Hemoglobin: 15.2 g/dL (ref 13.0–17.0)
LYMPHS ABS: 1.6 10*3/uL (ref 0.7–4.0)
Lymphocytes Relative: 16.6 % (ref 12.0–46.0)
MCHC: 33.1 g/dL (ref 30.0–36.0)
MCV: 88.8 fl (ref 78.0–100.0)
MONOS PCT: 7.6 % (ref 3.0–12.0)
Monocytes Absolute: 0.7 10*3/uL (ref 0.1–1.0)
NEUTROS ABS: 7.3 10*3/uL (ref 1.4–7.7)
NEUTROS PCT: 74 % (ref 43.0–77.0)
PLATELETS: 304 10*3/uL (ref 150.0–400.0)
RBC: 5.17 Mil/uL (ref 4.22–5.81)
RDW: 13.6 % (ref 11.5–15.5)
WBC: 9.9 10*3/uL (ref 4.0–10.5)

## 2017-01-28 LAB — COMPREHENSIVE METABOLIC PANEL
ALT: 31 U/L (ref 0–53)
AST: 30 U/L (ref 0–37)
Albumin: 4.8 g/dL (ref 3.5–5.2)
Alkaline Phosphatase: 103 U/L (ref 39–117)
BUN: 19 mg/dL (ref 6–23)
CHLORIDE: 87 meq/L — AB (ref 96–112)
CO2: 28 meq/L (ref 19–32)
Calcium: 10.3 mg/dL (ref 8.4–10.5)
Creatinine, Ser: 1.05 mg/dL (ref 0.40–1.50)
GFR: 100.46 mL/min (ref >60.00)
GLUCOSE: 221 mg/dL — AB (ref 70–99)
POTASSIUM: 4.3 meq/L (ref 3.5–5.1)
Sodium: 127 mEq/L — ABNORMAL LOW (ref 135–145)
Total Bilirubin: 2 mg/dL — ABNORMAL HIGH (ref 0.2–1.2)
Total Protein: 7.7 g/dL (ref 6.0–8.3)

## 2017-01-28 LAB — LIPASE: Lipase: 1 U/L — ABNORMAL LOW (ref 11.0–59.0)

## 2017-01-28 LAB — AMYLASE: AMYLASE: 37 U/L (ref 27–131)

## 2017-01-28 NOTE — Telephone Encounter (Signed)
Future lab Ppaced to recheck sodium level.

## 2017-01-29 MED FILL — POTASSIUM CL ER 10 MEQ TAB: 10 | 90 days supply | Qty: 180 | Fill #0

## 2017-01-30 MED ORDER — ZOLPIDEM TARTRATE 5 MG PO TABS: 5.0000 mg | ORAL_TABLET | Freq: Every evening | ORAL | 0 refills | Status: DC | PRN

## 2017-01-30 NOTE — Telephone Encounter (Signed)
Pt states has not heard back from refill request Ambien. Please call to [place]. Call pt.

## 2017-01-30 NOTE — Telephone Encounter (Signed)
Refill Request: Zolpidem   Last RX:12/25/16 Last OV:01/27/17 Next OV: None UDS:11/12/16 CSC:06/19/16

## 2017-01-30 NOTE — Telephone Encounter (Addendum)
Would you mind looking and find out who wrote his last prescription of Ambien. I could not find it but he is patient of [name]. If she has been writing it in the past then I feel comfortable giving refill. If by chance I wrote the last prescription then we should run it by her as she is PCP. The prescription was placed back on your desk/keyboard

## 2017-01-30 NOTE — Addendum Note (Signed)
Addended by: Hinton Dyer on: 01/30/2017 12:34 PM   Modules accepted: Orders

## 2017-01-31 ENCOUNTER — Telehealth: Payer: Self-pay | Admitting: Medical

## 2017-01-31 NOTE — Telephone Encounter (Signed)
Pt called. Pharmacy does not have Ambien script. Yesterday Ambien 5 mg was written and printed but not faxed. Please fax/ send script to pt pharmacy.

## 2017-01-31 NOTE — Telephone Encounter (Signed)
Rx sent to pharmacy   

## 2017-01-31 NOTE — Telephone Encounter (Signed)
Willie [name]-- it looks like [name] did Rx on 12/25/16, #30. Do you still have Rx?

## 2017-01-31 NOTE — Telephone Encounter (Signed)
Filled rx of ambien.

## 2017-02-03 MED FILL — ZOLPIDEM TARTRATE 5 MG TABL: 5 | 30 days supply | Qty: 30 | Fill #0

## 2017-02-24 ENCOUNTER — Other Ambulatory Visit: Payer: Self-pay | Admitting: Family

## 2017-02-25 MED FILL — LANTUS SOLOSTAR 100 UNITS/M: 100 | 38 days supply | Qty: 15 | Fill #0

## 2017-02-27 ENCOUNTER — Encounter: Payer: Self-pay | Admitting: Family

## 2017-02-27 ENCOUNTER — Ambulatory Visit (INDEPENDENT_AMBULATORY_CARE_PROVIDER_SITE_OTHER): Payer: 59 | Admitting: Family

## 2017-02-27 ENCOUNTER — Telehealth: Payer: Self-pay | Admitting: Family

## 2017-02-27 DIAGNOSIS — F1010 Alcohol abuse, uncomplicated: Secondary | ICD-10-CM | POA: Diagnosis not present

## 2017-02-27 DIAGNOSIS — F39 Unspecified mood [affective] disorder: Secondary | ICD-10-CM | POA: Diagnosis not present

## 2017-02-27 DIAGNOSIS — G4700 Insomnia, unspecified: Secondary | ICD-10-CM | POA: Diagnosis not present

## 2017-02-27 DIAGNOSIS — E118 Type 2 diabetes mellitus with unspecified complications: Secondary | ICD-10-CM | POA: Diagnosis not present

## 2017-02-27 DIAGNOSIS — E1165 Type 2 diabetes mellitus with hyperglycemia: Secondary | ICD-10-CM | POA: Diagnosis not present

## 2017-02-27 DIAGNOSIS — I10 Essential (primary) hypertension: Secondary | ICD-10-CM | POA: Diagnosis not present

## 2017-02-27 DIAGNOSIS — E785 Hyperlipidemia, unspecified: Secondary | ICD-10-CM | POA: Diagnosis not present

## 2017-02-27 DIAGNOSIS — Z23 Encounter for immunization: Secondary | ICD-10-CM

## 2017-02-27 DIAGNOSIS — Z79899 Other long term (current) drug therapy: Secondary | ICD-10-CM | POA: Diagnosis not present

## 2017-02-27 LAB — COMPREHENSIVE METABOLIC PANEL
ALBUMIN: 4.2 g/dL (ref 3.5–5.2)
ALT: 33 U/L (ref 0–53)
AST: 24 U/L (ref 0–37)
Alkaline Phosphatase: 93 U/L (ref 39–117)
BUN: 8 mg/dL (ref 6–23)
CHLORIDE: 99 meq/L (ref 96–112)
CO2: 27 meq/L (ref 19–32)
CREATININE: 0.87 mg/dL (ref 0.40–1.50)
Calcium: 9.4 mg/dL (ref 8.4–10.5)
GFR: 124.76 mL/min (ref >60.00)
GLUCOSE: 246 mg/dL — AB (ref 70–99)
Potassium: 3.6 mEq/L (ref 3.5–5.1)
SODIUM: 134 meq/L — AB (ref 135–145)
Total Bilirubin: 0.5 mg/dL (ref 0.2–1.2)
Total Protein: 7.2 g/dL (ref 6.0–8.3)

## 2017-02-27 LAB — LIPID PANEL
CHOL/HDL RATIO: 3
Cholesterol: 114 mg/dL (ref 0–200)
HDL: 38.1 mg/dL — ABNORMAL LOW (ref >39.00)
LDL CALC: 52 mg/dL (ref 0–99)
NONHDL: 76.19
Triglycerides: 121 mg/dL (ref 0.0–149.0)
VLDL: 24.2 mg/dL (ref 0.0–40.0)

## 2017-02-27 LAB — HEMOGLOBIN A1C: Hgb A1c MFr Bld: 10.7 % — ABNORMAL HIGH (ref 4.6–6.5)

## 2017-02-27 MED ORDER — PANTOPRAZOLE SODIUM 40 MG PO TBEC: 40.0000 mg | DELAYED_RELEASE_TABLET | Freq: Every day | ORAL | 1 refills | Status: DC

## 2017-02-27 MED ORDER — HYDROXYZINE HCL 50 MG PO TABS
50.0000 mg | ORAL_TABLET | Freq: Three times a day (TID) | ORAL | 1 refills | Status: DC | PRN
Start: 2017-02-27 — End: 2017-07-07

## 2017-02-27 MED ORDER — FREESTYLE FREEDOM LITE W/DEVICE KIT: PACK | 2 refills | Status: DC

## 2017-02-27 MED ORDER — CLONIDINE HCL 0.1 MG PO TABS: 0.1000 mg | ORAL_TABLET | Freq: Three times a day (TID) | ORAL | 1 refills | Status: DC

## 2017-02-27 MED ORDER — ZOLPIDEM TARTRATE 5 MG PO TABS: 5.0000 mg | ORAL_TABLET | Freq: Every evening | ORAL | 5 refills | Status: DC | PRN

## 2017-02-27 MED ORDER — FREESTYLE LANCETS MISC: 5 refills | Status: AC

## 2017-02-27 MED ORDER — TRIAMCINOLONE ACETONIDE 0.1 % EX CREA: 1.0000 "application " | TOPICAL_CREAM | Freq: Two times a day (BID) | CUTANEOUS | 0 refills | Status: DC

## 2017-02-27 MED ORDER — ATORVASTATIN CALCIUM 20 MG PO TABS: 20.0000 mg | ORAL_TABLET | Freq: Every day | ORAL | 1 refills | Status: DC

## 2017-02-27 MED ORDER — LISINOPRIL 20 MG PO TABS: ORAL_TABLET | ORAL | 1 refills | Status: DC

## 2017-02-27 MED ORDER — POTASSIUM CHLORIDE CRYS ER 10 MEQ PO TBCR: 20.0000 meq | EXTENDED_RELEASE_TABLET | Freq: Every day | ORAL | 1 refills | Status: DC

## 2017-02-27 MED ORDER — INSULIN GLARGINE 100 UNIT/ML SOLOSTAR PEN
40.0000 [IU] | PEN_INJECTOR | Freq: Every day | SUBCUTANEOUS | 0 refills | Status: DC
Start: 2017-02-27 — End: 2017-07-03

## 2017-02-27 MED ORDER — AMLODIPINE BESYLATE 10 MG PO TABS: 10.0000 mg | ORAL_TABLET | Freq: Every day | ORAL | 1 refills | Status: DC

## 2017-02-27 MED ORDER — ESCITALOPRAM OXALATE 20 MG PO TABS: 20.0000 mg | ORAL_TABLET | Freq: Every day | ORAL | 1 refills | Status: DC

## 2017-02-27 MED ORDER — VALACYCLOVIR HCL 500 MG PO TABS: 500.0000 mg | ORAL_TABLET | Freq: Every day | ORAL | 1 refills | Status: DC

## 2017-02-27 MED ORDER — INSULIN LISPRO 100 UNIT/ML (KWIKPEN)
PEN_INJECTOR | SUBCUTANEOUS | 4 refills | Status: DC
Start: 2017-02-27 — End: 2018-07-24

## 2017-02-27 MED ORDER — QUETIAPINE FUMARATE 300 MG PO TABS: 300.0000 mg | ORAL_TABLET | Freq: Every day | ORAL | 1 refills | Status: DC

## 2017-02-27 MED ORDER — GLUCOSE BLOOD VI STRP: ORAL_STRIP | 12 refills | Status: DC

## 2017-02-27 MED ORDER — INSULIN PEN NEEDLE 31G X 8 MM MISC: 1 refills | Status: DC

## 2017-02-27 MED ORDER — THIAMINE HCL 100 MG PO TABS: 100.0000 mg | ORAL_TABLET | Freq: Every day | ORAL | 1 refills | Status: DC

## 2017-02-27 MED FILL — TRIAMCINOLONE 0.1% CREAM: 0.1 | 10 days supply | Qty: 30 | Fill #0

## 2017-02-27 NOTE — Assessment & Plan Note (Addendum)
Uncontrolled.  Will refer to endocrinology. Lab Results  Component Value Date   HGBA1C 10.7 (H) 02/27/2017

## 2017-02-27 NOTE — Telephone Encounter (Signed)
See my chart message

## 2017-02-27 NOTE — Assessment & Plan Note (Signed)
Stable, reports last drink was greater than 1 month ago.

## 2017-02-27 NOTE — Assessment & Plan Note (Signed)
Currently stable. He is maintained on seroquel and lexapro. Continue same.

## 2017-02-27 NOTE — Patient Instructions (Signed)
Please complete lab work prior to leaving.   

## 2017-02-27 NOTE — Progress Notes (Signed)
Subjective:    Patient ID: Willie [name], male    DOB: 1975-08-29, 41 y.o.   MRN: [id number]  HPI  Willie [name] is a 41 yr old male who presents today for follow up.  DM2- reports that his sugars have generally been in the 150's. He is using the humalog once a day in the AM 15 units and 40 units of lantus.  Lab Results  Component Value Date   HGBA1C 7.9 (H) 11/12/2016   HGBA1C 10.2 (H) 06/25/2016   HGBA1C 12.6 Repeated and verified X2. (H) 09/13/2015   Lab Results  Component Value Date   MICROALBUR CANCELED 11/12/2016   LDLCALC 55 06/25/2016   CREATININE 1.05 01/27/2017   ETOH abuse- last drink >1 month ago.    HTN- maintained on clonidine for anxiety.   BP Readings from Last 3 Encounters:  02/27/17 114/83  01/27/17 123/83  12/10/16 (!) 137/91   Insomnia- sleeping well.    Mood has been good.  Maintained on seroquel and lexapro.   Review of Systems See HPI  Past Medical History:  Diagnosis Date  . Alcoholic pancreatitis    recurrent  . Depression   . Diabetes mellitus, type II ([place])    New Onset 03/2010  . GERD (gastroesophageal reflux disease)   . High cholesterol   . 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 Hypertension   . Sleep apnea      Social History   Social History  . Marital status: Married    Spouse name: N/A  . Number of children: 2  . Years of education: N/A   Occupational History  . 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 Smoking status: Current Every Day Smoker    Types: Cigarettes    Start date: 04/29/2016  . Smokeless tobacco: Never Used     Comment: 4 Cigarettes a day   . Alcohol use No     Comment: 3 weeks ago, last drink  . Drug use: No  . Sexual activity: Not on file   Other Topics Concern  . 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 LUMBAR MICRODISCECTOMY  ~ 2004   Dr Ellene Route    Family History  Problem Relation Age of Onset  . Diabetes Mother   . Lung cancer Brother        twin brother  . Pancreatic cancer Paternal Aunt   . Colon cancer Neg Hx   . Stomach cancer Neg Hx     Allergies  Allergen Reactions  . Invokamet [Canagliflozin-Metformin Hcl] Other (See Comments)    Lactic acidosis  . Metformin And Related     Drastic drop in blood sugar    Current Outpatient Prescriptions on File Prior to Visit  Medication Sig Dispense Refill  . amLODipine (NORVASC) 10 MG tablet TAKE 1 TABLET BY MOUTH DAILY 90 tablet 1  . atorvastatin (LIPITOR) 20 MG tablet Take 1 tablet (20 mg total) by mouth daily. 90 tablet 1  . Blood Glucose Monitoring Suppl (FREESTYLE FREEDOM LITE) w/Device KIT 2 (  two) times daily 1 each 2  . cloNIDine (CATAPRES) 0.1 MG tablet Take 1 tablet (0.1 mg total) by mouth 3 (three) times daily. 270 tablet 1  . escitalopram (LEXAPRO) 20 MG tablet TAKE 1 TABLET BY MOUTH DAILY 90 tablet 1  . glucose blood (FREESTYLE LITE) test strip Use as instructed 100 each 12  . hydrOXYzine (ATARAX/VISTARIL) 50 MG tablet Take 1 tablet (50 mg total) by mouth 3 (three) times daily as needed. 270 tablet 1  . insulin lispro (HUMALOG KWIKPEN) 100 UNIT/ML KiwkPen 3 times a day (just before each meal) 09-10-08 units, 15 mL 4  . Insulin Pen Needle (ULTICARE SHORT PEN NEEDLES) 31G X 8 MM MISC Use once daily to inject insulin. 100 each 1  . Lancets (FREESTYLE) lancets 2 (two) times daily 200 each 5  . LANTUS SOLOSTAR 100 UNIT/ML Solostar Pen INJECT 40 UNITS INTO THE SKIN DAILY AT 10 PM. 15 mL 0  . lisinopril (PRINIVIL,ZESTRIL) 20 MG tablet TAKE 1 TABLET (20 MG TOTAL) BY MOUTH DAILY. 90 tablet 1  . Multiple Vitamins-Minerals (MULTIVITAMIN) tablet Take 1 tablet by mouth daily. 30 tablet 0  .  pantoprazole (PROTONIX) 40 MG tablet TAKE 1 TABLET BY MOUTH DAILY 90 tablet 1  . potassium chloride (K-DUR,KLOR-CON) 10 MEQ tablet TAKE 2 TABLETS (20 MEQ TOTAL) BY MOUTH DAILY. 180 tablet 1  . QUEtiapine (SEROQUEL) 300 MG tablet TAKE 1 TABLET BY MOUTH AT BEDTIME 90 tablet 1  . tadalafil (CIALIS) 10 MG tablet Take 1 tablet (10 mg total) by mouth daily as needed for erectile dysfunction. 10 tablet 5  . thiamine 100 MG tablet Take 1 tablet (100 mg total) by mouth daily. 90 tablet 1  . valACYclovir (VALTREX) 500 MG tablet TAKE 1 TABLET BY MOUTH DAILY 90 tablet 1  . zolpidem (AMBIEN) 5 MG tablet Take 1 tablet (5 mg total) by mouth at bedtime as needed. for sleep 30 tablet 0   No current facility-administered medications on file prior to visit.     BP 114/83 (BP Location: Right Arm, Patient Position: Sitting, Cuff Size: Small)   Pulse 98   Temp 98.1 F (36.7 C) (Oral)   Resp 18   Ht '5\' 5"'  (1.651 m)   Wt 226 lb 3.2 oz (102.6 kg)   SpO2 100%   BMI 37.64 kg/m       Objective:   Physical Exam  Constitutional: He is oriented to person, place, and time. He appears well-developed and well-nourished. No distress.  HENT:  Head: Normocephalic and atraumatic.  Cardiovascular: Normal rate and regular rhythm.   No murmur heard. Pulmonary/Chest: Effort normal and breath sounds normal. No respiratory distress. He has no wheezes. He has no rales.  Musculoskeletal: He exhibits no edema.  Neurological: He is alert and oriented to person, place, and time.  Skin: Skin is warm and dry.  Psychiatric: He has a normal mood and affect. His behavior is normal. Thought content normal.          Assessment & Plan:

## 2017-02-27 NOTE — Assessment & Plan Note (Signed)
Blood pressure stable.  Continue current meds.

## 2017-02-27 NOTE — Assessment & Plan Note (Signed)
Stable on current dose of Ambien.  Continue same.

## 2017-03-04 LAB — PAIN MGMT, PROFILE 8 W/CONF, U
6 ACETYLMORPHINE: NEGATIVE ng/mL (ref <10)
ALPHAHYDROXYMIDAZOLAM: NEGATIVE ng/mL (ref <50)
ALPHAHYDROXYTRIAZOLAM: NEGATIVE ng/mL (ref <50)
AMPHETAMINES: NEGATIVE ng/mL (ref <500)
Alcohol Metabolites: NEGATIVE ng/mL (ref <500)
Alphahydroxyalprazolam: NEGATIVE ng/mL (ref <25)
Aminoclonazepam: NEGATIVE ng/mL (ref <25)
Benzodiazepines: NEGATIVE ng/mL (ref <100)
Buprenorphine, Urine: NEGATIVE ng/mL (ref <5)
Cocaine Metabolite: NEGATIVE ng/mL (ref <150)
Creatinine: 94.5 mg/dL
Hydroxyethylflurazepam: NEGATIVE ng/mL (ref <50)
Lorazepam: NEGATIVE ng/mL (ref <50)
MDMA: NEGATIVE ng/mL (ref <500)
Marijuana Metabolite: NEGATIVE ng/mL (ref <20)
NORDIAZEPAM: NEGATIVE ng/mL (ref <50)
OPIATES: NEGATIVE ng/mL (ref <100)
OXAZEPAM: NEGATIVE ng/mL (ref <50)
OXIDANT: NEGATIVE ug/mL (ref <200)
OXYCODONE: NEGATIVE ng/mL (ref <100)
PH: 6.27 (ref 4.5–9.0)
Temazepam: NEGATIVE ng/mL (ref <50)

## 2017-03-04 MED FILL — ZOLPIDEM TARTRATE 5 MG TABL: 5 | 30 days supply | Qty: 30 | Fill #0

## 2017-03-07 MED FILL — cloNIDine HCL 0.1 MG TABS: 0.1 | 90 days supply | Qty: 270 | Fill #0

## 2017-03-07 MED FILL — ATORVASTATIN 20 MG TABLET: 20 | 90 days supply | Qty: 90 | Fill #0

## 2017-03-07 MED FILL — VITAMIN B-1 100 MG TABLET: 100 | 100 days supply | Qty: 100 | Fill #0

## 2017-03-07 MED FILL — TRIAMCINOLONE 0.1% CREAM: 0.1 | 5 days supply | Qty: 30 | Fill #0

## 2017-03-07 MED FILL — hydrOXYzine HCL 50 MG TABS: 50 | 90 days supply | Qty: 270 | Fill #0

## 2017-03-07 MED FILL — LISINOPRIL 20 MG TABLET: 20 | 90 days supply | Qty: 90 | Fill #0

## 2017-03-17 ENCOUNTER — Encounter: Payer: Self-pay | Admitting: Family

## 2017-03-18 ENCOUNTER — Ambulatory Visit: Payer: Self-pay | Admitting: Endocrinology

## 2017-03-26 ENCOUNTER — Other Ambulatory Visit: Payer: Self-pay

## 2017-03-26 ENCOUNTER — Emergency Department (HOSPITAL_BASED_OUTPATIENT_CLINIC_OR_DEPARTMENT_OTHER): Payer: 59

## 2017-03-26 ENCOUNTER — Observation Stay (HOSPITAL_BASED_OUTPATIENT_CLINIC_OR_DEPARTMENT_OTHER)
Admission: EM | Admit: 2017-03-26 | Discharge: 2017-03-27 | Disposition: A | Discharge status: Home / Self Care | Payer: 59 | Attending: Internal Medicine | Admitting: Internal Medicine

## 2017-03-26 ENCOUNTER — Encounter (HOSPITAL_BASED_OUTPATIENT_CLINIC_OR_DEPARTMENT_OTHER): Payer: Self-pay | Admitting: Emergency Medicine

## 2017-03-26 DIAGNOSIS — Z888 Allergy status to other drugs, medicaments and biological substances status: Secondary | ICD-10-CM | POA: Insufficient documentation

## 2017-03-26 DIAGNOSIS — R1013 Epigastric pain: Secondary | ICD-10-CM | POA: Diagnosis not present

## 2017-03-26 DIAGNOSIS — I10 Essential (primary) hypertension: Secondary | ICD-10-CM | POA: Diagnosis not present

## 2017-03-26 DIAGNOSIS — R160 Hepatomegaly, not elsewhere classified: Secondary | ICD-10-CM | POA: Insufficient documentation

## 2017-03-26 DIAGNOSIS — G4730 Sleep apnea, unspecified: Secondary | ICD-10-CM | POA: Insufficient documentation

## 2017-03-26 DIAGNOSIS — E872 Acidosis, unspecified: Secondary | ICD-10-CM

## 2017-03-26 DIAGNOSIS — R0789 Other chest pain: Secondary | ICD-10-CM | POA: Insufficient documentation

## 2017-03-26 DIAGNOSIS — Z794 Long term (current) use of insulin: Secondary | ICD-10-CM | POA: Diagnosis not present

## 2017-03-26 DIAGNOSIS — K449 Diaphragmatic hernia without obstruction or gangrene: Secondary | ICD-10-CM | POA: Diagnosis not present

## 2017-03-26 DIAGNOSIS — K219 Gastro-esophageal reflux disease without esophagitis: Secondary | ICD-10-CM | POA: Diagnosis not present

## 2017-03-26 DIAGNOSIS — F418 Other specified anxiety disorders: Secondary | ICD-10-CM | POA: Diagnosis not present

## 2017-03-26 DIAGNOSIS — E1165 Type 2 diabetes mellitus with hyperglycemia: Secondary | ICD-10-CM | POA: Diagnosis not present

## 2017-03-26 DIAGNOSIS — F39 Unspecified mood [affective] disorder: Secondary | ICD-10-CM | POA: Insufficient documentation

## 2017-03-26 DIAGNOSIS — D1803 Hemangioma of intra-abdominal structures: Secondary | ICD-10-CM | POA: Diagnosis not present

## 2017-03-26 DIAGNOSIS — R112 Nausea with vomiting, unspecified: Secondary | ICD-10-CM | POA: Insufficient documentation

## 2017-03-26 DIAGNOSIS — E7800 Pure hypercholesterolemia, unspecified: Secondary | ICD-10-CM | POA: Diagnosis not present

## 2017-03-26 DIAGNOSIS — Z79899 Other long term (current) drug therapy: Secondary | ICD-10-CM | POA: Insufficient documentation

## 2017-03-26 DIAGNOSIS — K5730 Diverticulosis of large intestine without perforation or abscess without bleeding: Secondary | ICD-10-CM | POA: Insufficient documentation

## 2017-03-26 DIAGNOSIS — IMO0002 Reserved for concepts with insufficient information to code with codable children: Secondary | ICD-10-CM | POA: Diagnosis present

## 2017-03-26 DIAGNOSIS — R079 Chest pain, unspecified: Secondary | ICD-10-CM | POA: Diagnosis not present

## 2017-03-26 DIAGNOSIS — R1110 Vomiting, unspecified: Secondary | ICD-10-CM | POA: Diagnosis present

## 2017-03-26 DIAGNOSIS — K760 Fatty (change of) liver, not elsewhere classified: Secondary | ICD-10-CM | POA: Diagnosis not present

## 2017-03-26 DIAGNOSIS — E860 Dehydration: Secondary | ICD-10-CM | POA: Diagnosis not present

## 2017-03-26 DIAGNOSIS — F1010 Alcohol abuse, uncomplicated: Secondary | ICD-10-CM | POA: Insufficient documentation

## 2017-03-26 DIAGNOSIS — Z720 Tobacco use: Secondary | ICD-10-CM | POA: Diagnosis present

## 2017-03-26 LAB — CBC WITH DIFFERENTIAL/PLATELET
BASOS ABS: 0.1 10*3/uL (ref 0.0–0.1)
Basophils Relative: 0 %
Eosinophils Absolute: 0 10*3/uL (ref 0.0–0.7)
Eosinophils Relative: 0 %
HEMATOCRIT: 43.5 % (ref 39.0–52.0)
HEMOGLOBIN: 15.6 g/dL (ref 13.0–17.0)
LYMPHS PCT: 5 %
Lymphs Abs: 0.8 10*3/uL (ref 0.7–4.0)
MCH: 28.9 pg (ref 26.0–34.0)
MCHC: 35.9 g/dL (ref 30.0–36.0)
MCV: 80.7 fL (ref 78.0–100.0)
MONO ABS: 1 10*3/uL (ref 0.1–1.0)
MONOS PCT: 6 %
NEUTROS ABS: 14.6 10*3/uL — AB (ref 1.7–7.7)
Neutrophils Relative %: 89 %
Platelets: 460 10*3/uL — ABNORMAL HIGH (ref 150–400)
RBC: 5.39 MIL/uL (ref 4.22–5.81)
RDW: 14.1 % (ref 11.5–15.5)
WBC: 16.4 10*3/uL — ABNORMAL HIGH (ref 4.0–10.5)

## 2017-03-26 LAB — COMPREHENSIVE METABOLIC PANEL
ALK PHOS: 135 U/L — AB (ref 38–126)
AST: 83 U/L — AB (ref 15–41)
Albumin: 4.8 g/dL (ref 3.5–5.0)
Anion gap: 19 — ABNORMAL HIGH (ref 5–15)
BILIRUBIN TOTAL: 1.5 mg/dL — AB (ref 0.3–1.2)
BUN: 12 mg/dL (ref 6–20)
CALCIUM: 8.8 mg/dL — AB (ref 8.9–10.3)
CO2: 22 mmol/L (ref 22–32)
CREATININE: 1.13 mg/dL (ref 0.61–1.24)
Chloride: 91 mmol/L — ABNORMAL LOW (ref 101–111)
GFR calc Af Amer: >60 mL/min (ref >60)
Glucose, Bld: 320 mg/dL — ABNORMAL HIGH (ref 65–99)
POTASSIUM: 4.1 mmol/L (ref 3.5–5.1)
Sodium: 132 mmol/L — ABNORMAL LOW (ref 135–145)
TOTAL PROTEIN: 8.4 g/dL — AB (ref 6.5–8.1)

## 2017-03-26 LAB — I-STAT CG4 LACTIC ACID, ED
Lactic Acid, Venous: 10.97 mmol/L (ref 0.5–1.9)
Lactic Acid, Venous: 3.04 mmol/L (ref 0.5–1.9)

## 2017-03-26 LAB — URINALYSIS, MICROSCOPIC (REFLEX)
BACTERIA UA: NONE SEEN
Squamous Epithelial / LPF: NONE SEEN
WBC, UA: NONE SEEN WBC/hpf (ref 0–5)

## 2017-03-26 LAB — I-STAT VENOUS BLOOD GAS, ED
BICARBONATE: 23.3 mmol/L (ref 20.0–28.0)
O2 Saturation: 68 %
PO2 VEN: 33 mmHg (ref 32.0–45.0)
TCO2: 24 mmol/L (ref 22–32)
pCO2, Ven: 33.5 mmHg — ABNORMAL LOW (ref 44.0–60.0)
pH, Ven: 7.451 — ABNORMAL HIGH (ref 7.250–7.430)

## 2017-03-26 LAB — URINALYSIS, ROUTINE W REFLEX MICROSCOPIC
BILIRUBIN URINE: NEGATIVE
HGB URINE DIPSTICK: NEGATIVE
KETONES UR: 40 mg/dL — AB
Leukocytes, UA: NEGATIVE
Nitrite: NEGATIVE
PH: 5.5 (ref 5.0–8.0)
PROTEIN: NEGATIVE mg/dL
Specific Gravity, Urine: 1.02 (ref 1.005–1.030)

## 2017-03-26 LAB — RAPID URINE DRUG SCREEN, HOSP PERFORMED
AMPHETAMINES: NOT DETECTED
BARBITURATES: NOT DETECTED
Benzodiazepines: NOT DETECTED
Cocaine: NOT DETECTED
Opiates: NOT DETECTED
TETRAHYDROCANNABINOL: NOT DETECTED

## 2017-03-26 LAB — TROPONIN I: Troponin I: <0.03 ng/mL (ref <0.03)

## 2017-03-26 LAB — CBG MONITORING, ED: Glucose-Capillary: 330 mg/dL — ABNORMAL HIGH (ref 65–99)

## 2017-03-26 LAB — LIPASE, BLOOD: LIPASE: 14 U/L (ref 11–51)

## 2017-03-26 MED ORDER — FAMOTIDINE IN NACL 20-0.9 MG/50ML-% IV SOLN
20.0000 mg | Freq: Once | INTRAVENOUS | Status: AC
  Administered 2017-03-26: 20 mg via INTRAVENOUS
  Filled 2017-03-26: qty 50

## 2017-03-26 MED ORDER — PROMETHAZINE HCL 25 MG/ML IJ SOLN
25.0000 mg | Freq: Once | INTRAMUSCULAR | Status: AC
  Administered 2017-03-26: 25 mg via INTRAVENOUS
  Filled 2017-03-26: qty 1

## 2017-03-26 MED ORDER — HYDROMORPHONE HCL 1 MG/ML IJ SOLN
1.0000 mg | Freq: Once | INTRAMUSCULAR | Status: AC
  Administered 2017-03-26: 1 mg via INTRAVENOUS
  Filled 2017-03-26: qty 1

## 2017-03-26 MED ORDER — IOPAMIDOL (ISOVUE-300) INJECTION 61%
100.0000 mL | Freq: Once | INTRAVENOUS | Status: AC | PRN
  Administered 2017-03-26: 100 mL via INTRAVENOUS

## 2017-03-26 MED ORDER — ONDANSETRON HCL 4 MG/2ML IJ SOLN
INTRAMUSCULAR | Status: AC
  Filled 2017-03-26: qty 2

## 2017-03-26 MED ORDER — SODIUM CHLORIDE 0.9 % IV BOLUS (SEPSIS)
1000.0000 mL | Freq: Once | INTRAVENOUS | Status: AC
  Administered 2017-03-26: 1000 mL via INTRAVENOUS

## 2017-03-26 MED ORDER — SODIUM CHLORIDE 0.9 % IV SOLN
INTRAVENOUS | Status: DC
  Administered 2017-03-26 – 2017-03-27 (×2): via INTRAVENOUS

## 2017-03-26 MED ORDER — ONDANSETRON HCL 4 MG/2ML IJ SOLN
4.0000 mg | Freq: Once | INTRAMUSCULAR | Status: AC
  Administered 2017-03-26: 4 mg via INTRAVENOUS

## 2017-03-26 MED ORDER — PNEUMOCOCCAL VAC POLYVALENT 25 MCG/0.5ML IJ INJ
0.5000 mL | INJECTION | INTRAMUSCULAR | Status: DC
  Filled 2017-03-26: qty 0.5

## 2017-03-26 NOTE — ED Notes (Signed)
ED Provider at bedside. Dr. Goldston 

## 2017-03-26 NOTE — H&P (Signed)
History and Physical    Willie [name]:[id number] DOB: 1976-02-09 DOA: 03/26/2017  PCP: Debbrah Alar, NP Consultants:  Buddy Duty - Endocrinology Patient coming from:  Home - lives alone; NOK: Ex-wife, [phone]  Chief Complaint: vomiting  HPI: Willie [name] is a 41 y.o. male with medical history significant of ETOH abuse, alcoholic pancreatitis and type 2 DM presenting with severe vomiting x 1 day.  He was unable to keep anything down.  He was drinking water and it just kept coming back up.  He felt very weak with chills.  Midepigastric abdominal pain that started last night, as well.  He was barely able to walk.  Last emesis was at [facility].  No hematemesis.  He is thinking that maybe he had food poisoning after eating leftovers from Thanksgiving (he made his own Thanksgiving dinner).  He works at [employer] and there are sick contacts there.  Unsure if he has had fever.  He describes a vague and very secondary left-sided chest discomfort a few days ago.  Last ETOH was 4 days ago.  He drinks intermittently.  He states that he drank a little for Thanksgiving.  He does have a h/o pancreatitis but there was far more emesis associated with this episode than then.   ED Course: Somewhat hypertensive, tachypnic with normal sats.  Looks much better now.  CT to r/o esophageal perforation.  Lactate 10+.  Repeat lactate still 3.  He was seen in the ER 2 years ago with similar presentation, taken off Metformin and Invokana (and did not resume).  No obvious source of sepsis - negative UA and CXR.  Possibly just severe dehydration but with minimal ketones and scant elevation in creatinine.  Negative troponin but with c/o chest pain.  Review of Systems: As per HPI; otherwise review of systems reviewed and negative.   Ambulatory Status:  Ambulates without assistance  Past Medical History:  Diagnosis Date  . Alcoholic pancreatitis    recurrent  . Depression   . Diabetes mellitus, type II  ([place])    New Onset 03/2010  . GERD (gastroesophageal reflux disease)   . High cholesterol   . 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 Hypertension   . Sleep apnea    does not wear a CPAP    Past Surgical History:  Procedure Laterality Date  . LUMBAR MICRODISCECTOMY  ~ 2004   Dr Ellene Route    Social History   Socioeconomic History  . Marital status: Married    Spouse name: Not on file  . Number of children: 2  . Years of education: Not on file  . Highest education level: Not on file  Social Needs  . Financial resource strain: Not on file  . Food insecurity - worry: Not on file  . Food insecurity - inability: Not on file  . Transportation needs - medical: Not on file  . Transportation needs - non-medical: Not on file  Occupational History  . Occupation: Landscape architect: [employer]  Tobacco Use  . Smoking status: Current Every Day Smoker    Packs/day: 0.25    Years: 1.00    Pack years: 0.25    Types: Cigarettes    Start date: 04/29/2016  . Smokeless tobacco: Never Used  . Tobacco comment: 4 Cigarettes a day   Substance and Sexual Activity  . Alcohol use: Yes    Comment: Last drank  on Thanksgiving  . Drug use: No    Comment: none in years  . Sexual activity: Not on file  Other Topics Concern  . 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 Invokamet [Canagliflozin-Metformin Hcl] Other (See Comments)    Lactic acidosis  . Metformin And Related     Drastic drop in blood sugar    Family History  Problem Relation Age of Onset  . Diabetes Mother   . Lung cancer Brother        twin brother  . Pancreatic cancer Paternal Aunt   . Colon cancer Neg Hx   . Stomach cancer Neg Hx      Prior to Admission medications   Medication Sig Start Date End Date Taking? Authorizing Provider  amLODipine (NORVASC) 10 MG tablet Take 1 tablet (10 mg total) by mouth daily. 02/27/17   Debbrah Alar, NP  atorvastatin (LIPITOR) 20 MG tablet Take 1 tablet (20 mg total) by mouth daily. 02/27/17   Debbrah Alar, NP  Blood Glucose Monitoring Suppl (FREESTYLE FREEDOM LITE) w/Device KIT 2 (two) times daily 02/27/17   Debbrah Alar, NP  cloNIDine (CATAPRES) 0.1 MG tablet Take 1 tablet (0.1 mg total) by mouth 3 (three) times daily. 02/27/17   Debbrah Alar, NP  escitalopram (LEXAPRO) 20 MG tablet Take 1 tablet (20 mg total) by mouth daily. 02/27/17   Debbrah Alar, NP  glucose blood (FREESTYLE LITE) test strip Use as instructed 02/27/17   Debbrah Alar, NP  hydrOXYzine (ATARAX/VISTARIL) 50 MG tablet Take 1 tablet (50 mg total) by mouth 3 (three) times daily as needed. 02/27/17   Debbrah Alar, NP  Insulin Glargine (LANTUS SOLOSTAR) 100 UNIT/ML Solostar Pen Inject 40 Units into the skin daily at 10 pm. 02/27/17   Debbrah Alar, NP  insulin lispro (HUMALOG KWIKPEN) 100 UNIT/ML KiwkPen 3 times a day (just before each meal) 09-10-08 units, 02/27/17   Debbrah Alar, NP  Insulin Pen Needle (ULTICARE SHORT PEN NEEDLES) 31G X 8 MM MISC Use once daily to inject insulin. 02/27/17   Debbrah Alar, NP  Lancets (FREESTYLE) lancets 2 (two) times daily 02/27/17   Debbrah Alar, NP  lisinopril (PRINIVIL,ZESTRIL) 20 MG tablet TAKE 1 TABLET (20 MG TOTAL) BY MOUTH DAILY. 02/27/17   Debbrah Alar, NP  Multiple Vitamins-Minerals (MULTIVITAMIN) tablet Take 1 tablet by mouth daily. 02/12/16   Palumbo, April, MD  pantoprazole (PROTONIX) 40 MG tablet Take 1 tablet (40 mg total) by mouth daily. 02/27/17   Debbrah Alar, NP  potassium chloride (K-DUR,KLOR-CON) 10 MEQ tablet Take 2 tablets (20 mEq total) by mouth daily. 02/27/17   Debbrah Alar, NP    QUEtiapine (SEROQUEL) 300 MG tablet Take 1 tablet (300 mg total) by mouth at bedtime. 02/27/17   Debbrah Alar, NP  tadalafil (CIALIS) 10 MG tablet Take 1 tablet (10 mg total) by mouth daily as needed for erectile dysfunction. 06/25/16   Debbrah Alar, NP  thiamine 100 MG tablet Take 1 tablet (100 mg total) by mouth daily. 02/27/17   Debbrah Alar, NP  triamcinolone cream (KENALOG) 0.1 % Apply 1 application topically 2 (two) times daily. 02/27/17   Debbrah Alar, NP  valACYclovir (VALTREX) 500 MG tablet  Take 1 tablet (500 mg total) by mouth daily. 02/27/17   Debbrah Alar, NP  zolpidem (AMBIEN) 5 MG tablet Take 1 tablet (5 mg total) by mouth at bedtime as needed. for sleep 02/27/17   Debbrah Alar, NP    Physical Exam: Vitals:   03/26/17 2100 03/26/17 2205 03/26/17 2323 03/26/17 2330  BP: (!) 155/103 (!) 158/98  (!) 151/97  Pulse: (!) 111 (!) 110  (!) 105  Resp: '13 20  20  ' Temp:    98.5 F (36.9 C)  TempSrc:    Oral  SpO2: 97% 99%  100%  Weight:   98.3 kg (216 lb 11.2 oz)   Height:   '5\' 11"'  (1.803 m)      General:  Appears calm but somewhat uncomfortable (nauseated) and is NAD Eyes:  EOMI, normal lids, iris ENT:  grossly normal hearing, lips & tongue, mmm Neck:  no LAD, masses or thyromegaly Cardiovascular:  Mild tachycardia, no m/r/g. No LE edema.  Respiratory:   CTA bilaterally with no wheezes/rales/rhonchi.  Normal respiratory effort. Abdomen:  soft, +midepigastric TTP, ND, NABS Skin:  no rash or induration seen on limited exam Musculoskeletal:  grossly normal tone BUE/BLE, good ROM, no bony abnormality Psychiatric:  grossly normal mood and affect, speech fluent and appropriate, AOx3 Neurologic:  CN 2-12 grossly intact, moves all extremities in coordinated fashion, sensation intact    Radiological Exams on Admission: Ct Chest W Contrast  Result Date: 03/26/2017 CLINICAL DATA:  41 y/o M; 2 days of left upper quadrant and left-sided chest  pain. Nausea, vomiting, acid reflux for 24 hours. EXAM: CT CHEST, ABDOMEN, AND PELVIS WITH CONTRAST TECHNIQUE: Multidetector CT imaging of the chest, abdomen and pelvis was performed following the standard protocol during bolus administration of intravenous contrast. CONTRAST:  159m ISOVUE-300 IOPAMIDOL (ISOVUE-300) INJECTION 61% COMPARISON:  02/16/2016 CT abdomen and pelvis. 03/22/2009 CT of the chest. FINDINGS: CT CHEST FINDINGS Cardiovascular: No significant vascular findings. Normal heart size. No pericardial effusion. Mediastinum/Nodes: No enlarged mediastinal, hilar, or axillary lymph nodes. Thyroid gland, trachea, and esophagus demonstrate no significant findings. Small hiatal hernia. Lungs/Pleura: Lungs are clear. No pleural effusion or pneumothorax. Musculoskeletal: No chest wall mass or suspicious bone lesions identified. CT ABDOMEN PELVIS FINDINGS Hepatobiliary: Hepatic steatosis and hepatomegaly. Stable 16 mm focus in segment 7 with enhancement following blood pool compatible with hemangioma (series 2, image 52). Stable subcentimeter lucency in segment 2, likely cyst (series 2, image 46). No other focal liver abnormality is seen. No gallstones, gallbladder wall thickening, or biliary dilatation. Pancreas: Pancreatic atrophy with calcifications compatible sequelae of chronic pancreatitis. Spleen: Normal in size without focal abnormality. Adrenals/Urinary Tract: Adrenal glands are unremarkable. Kidneys are normal, without renal calculi, focal lesion, or hydronephrosis. Bladder is unremarkable. Stomach/Bowel: Stomach is within normal limits. Appendix not identified, no pericecal inflammation. Mild sigmoid diverticulosis without findings of acute diverticulitis. No evidence of bowel wall thickening, distention, or inflammatory changes. Vascular/Lymphatic: No significant vascular findings are present. No enlarged abdominal or pelvic lymph nodes. Reproductive: Mild prostate enlargement. Other: No abdominal  wall hernia or abnormality. No abdominopelvic ascites. Musculoskeletal: No acute or significant osseous findings. IMPRESSION: 1. No acute process identified. 2. Small hiatal hernia. 3. Hepatic steatosis and hepatomegaly. 4. 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 5. Pancreatic atrophy with calcifications compatible with sequelae of chronic pancreatitis. 6. Sigmoid diverticulosis.  No findings of acute diverticulitis. Electronically Signed   By: LKristine GarbeM.D.   On: 03/26/2017 18:47  Ct Abdomen Pelvis W Contrast  Result Date: 03/26/2017 CLINICAL DATA:  41 y/o M; 2 days of left upper quadrant and left-sided chest pain. Nausea, vomiting, acid reflux for 24 hours. EXAM: CT CHEST, ABDOMEN, AND PELVIS WITH CONTRAST TECHNIQUE: Multidetector CT imaging of the chest, abdomen and pelvis was performed following the standard protocol during bolus administration of intravenous contrast. CONTRAST:  122m ISOVUE-300 IOPAMIDOL (ISOVUE-300) INJECTION 61% COMPARISON:  02/16/2016 CT abdomen and pelvis. 03/22/2009 CT of the chest. FINDINGS: CT CHEST FINDINGS Cardiovascular: No significant vascular findings. Normal heart size. No pericardial effusion. Mediastinum/Nodes: No enlarged mediastinal, hilar, or axillary lymph nodes. Thyroid gland, trachea, and esophagus demonstrate no significant findings. Small hiatal hernia. Lungs/Pleura: Lungs are clear. No pleural effusion or pneumothorax. Musculoskeletal: No chest wall mass or suspicious bone lesions identified. CT ABDOMEN PELVIS FINDINGS Hepatobiliary: Hepatic steatosis and hepatomegaly. Stable 16 mm focus in segment 7 with enhancement following blood pool compatible with hemangioma (series 2, image 52). Stable subcentimeter lucency in segment 2, likely cyst (series 2, image 46). No other focal liver abnormality is seen. No gallstones, gallbladder wall thickening, or biliary dilatation. Pancreas: Pancreatic atrophy with  calcifications compatible sequelae of chronic pancreatitis. Spleen: Normal in size without focal abnormality. Adrenals/Urinary Tract: Adrenal glands are unremarkable. Kidneys are normal, without renal calculi, focal lesion, or hydronephrosis. Bladder is unremarkable. Stomach/Bowel: Stomach is within normal limits. Appendix not identified, no pericecal inflammation. Mild sigmoid diverticulosis without findings of acute diverticulitis. No evidence of bowel wall thickening, distention, or inflammatory changes. Vascular/Lymphatic: No significant vascular findings are present. No enlarged abdominal or pelvic lymph nodes. Reproductive: Mild prostate enlargement. Other: No abdominal wall hernia or abnormality. No abdominopelvic ascites. Musculoskeletal: No acute or significant osseous findings. IMPRESSION: 1. No acute process identified. 2. Small hiatal hernia. 3. Hepatic steatosis and hepatomegaly. 4. 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 5. Pancreatic atrophy with calcifications compatible with sequelae of chronic pancreatitis. 6. Sigmoid diverticulosis.  No findings of acute diverticulitis. Electronically Signed   By: LKristine GarbeM.D.   On: 03/26/2017 18:47   Dg Chest Portable 1 View  Result Date: 03/26/2017 CLINICAL DATA:  41y/o M; left-sided chest pain and severe vomiting. EXAM: PORTABLE CHEST 1 VIEW COMPARISON:  07/18/2016 chest radiograph FINDINGS: Stable normal cardiac silhouette given projection and technique. Low lung volumes accentuate pulmonary markings. Clear lungs. No pleural effusion or pneumothorax. No acute osseous abnormality is evident. IMPRESSION: No acute pulmonary process identified. Electronically Signed   By: LKristine GarbeM.D.   On: 03/26/2017 18:34    EKG: Independently reviewed.  Sinus tachycardia with rate 124; nonspecific ST changes with no evidence of acute ischemia   Labs on Admission: I have personally reviewed the  available labs and imaging studies at the time of the admission.  Pertinent labs:   Lactate 10.97, 3.04 VBG: 7.451/33.5 UA: >500 glucose, 40 ketones UDS negative Na++ 132 Glucose 320; A1c 10.7 in 11/18 Anion gap 19 AST 83, ALT <5, Bili 1.5 Troponin <0.03 WBC 16.4   Assessment/Plan Principal Problem:   Lactic acidosis Active Problems:   Diabetes type 2, uncontrolled (HCC)   Essential hypertension   Alcohol abuse   GERD (gastroesophageal reflux disease)   Mood disorder (HCC)   Tobacco abuse   Vomiting alone   Chest pain   Lactic acidosis thought to be related to excessive vomiting -[facility] -He was found to have a lactate of almost 11, which cleared  to 3 with IVF -Evaluation for sepsis was otherwise negative - mildly increased WBC but negative CXR, negative UA -Suspect that his acidosis is due to gastritis/excessive emesis in conjunction with dehydration (despite normal creatinine) -Will observe with ongoing trending of lactate and ongoing hydration at 125 cc/hour -Given persistent vomiting which is finally improved but with ongoing nausea, will continue NPO for now -Given h/o GERD and mid-epigastric pain, will give IV Protonix 80 mg x 1 and then 40 mg q12h for probable gastritis -Will also give Carafate due to his concern about burning esophageal and epigastric pain -If symptoms are not improved/resolved in 24-48 hours, consider GI consultation  Chest pain -Patient with very vague description of left-sided chest pain within the last few days -Low suspicion for ACS -Will trend troponin x 3 and monitor on telemetry overnight -Suspect that this is related to his probable gastritis  DM -Poorly controlled based on last A1c and glucose today -No current evidence of DKA -Cover with SSI  HTN -Continue Norvasc, Catapres, Lisinopril (watch creatinine, but currently appears to be reasonable)  Alcohol abuse -h/o prior  episodes of pancreatis related to ETOH -Denies ETOH overuse -Reports no recent use, with only mild drinking almost a week ago -For now will not cover with CIWA but will remain cognizant of possible s/sx of ETOH withdrawal  Tobacco abuse -Encourage cessation.  This was discussed with the patient and should be reviewed on an ongoing basis.   -Patch declined by patient due to very little current use and cessation efforts.  Mood disorder -Continue Lexapro, Atarax, Seroquel, Ambien  DVT prophylaxis:  Lovenox  Code Status: Full - confirmed with patient Family Communication: None present Disposition Plan:  Home once clinically improved Consults called: None  Admission status: It is my clinical opinion that referral for OBSERVATION is reasonable and necessary in this patient based on the above information provided. The aforementioned taken together are felt to place the patient at high risk for further clinical deterioration. However it is anticipated that the patient may be medically stable for discharge from the hospital within 24 to 48 hours.    Karmen Bongo MD [facility]  If note is complete, please contact covering daytime or nighttime physician. [email or web address] Password [email or web address]  03/27/2017, 1:13 AM

## 2017-03-26 NOTE — ED Provider Notes (Signed)
[facility] Provider Note   CSN: [id number] Arrival date & time: 03/26/17  1702     History   Chief Complaint Chief Complaint  Patient presents with  . Abdominal Pain    HPI Willie [name] is a 41 y.o. male.  HPI  40 year old male with a history of alcoholic pancreatitis, type 2 diabetes and alcohol abuse presents with severe vomiting.  States the vomiting started yesterday.  Has been unable to keep down any fluids and whatever he drinks just come straight back up.  He denies any hematemesis.  There is no diarrhea.  He has had some burning when urinating but also has not been urinating much.  He states that sometime after the vomiting started today, he has developed some left upper abdominal pain and left lower chest pain.  He denies any shortness of breath.  Some pain in his mid back.  He states this feels different than his pancreatitis which is usually lower in his abdomen.  No fevers but has been having significant chills.  He denies drinking alcohol in the last couple days and states his last drink was 4 days ago and was only a small amount.  He denies drinking daily.  He denies any illicit drug use.  Past Medical History:  Diagnosis Date  . Alcoholic pancreatitis    recurrent  . Depression   . Diabetes mellitus, type II ([place])    New Onset 03/2010  . GERD (gastroesophageal reflux disease)   . High cholesterol   . 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 Hypertension   . Sleep apnea     Patient Active Problem List   Diagnosis Date Noted  . Lactic acidosis 03/26/2017  . History of alcohol abuse 06/25/2016  . Tobacco abuse 06/25/2016  . Anxiety state 11/20/2015  . Dental infection 07/10/2015  . Mood disorder ([place]) 04/18/2015  . Eczema 06/27/2014  . GERD (gastroesophageal reflux disease) 05/24/2014  . Snoring 04/07/2013  . Suicide attempt by substance overdose ([place])  02/10/2013  . Alcohol abuse 12/31/2012  . Erectile dysfunction 12/09/2012  . Hematuria 06/11/2012  . Vision problem 02/02/2012  . Insomnia 12/20/2011  . Depression with anxiety 11/13/2010  . Hyperlipemia 06/27/2010  . Diabetes type 2, uncontrolled ([place]) 05/22/2010  . Essential hypertension 05/22/2010    Past Surgical History:  Procedure Laterality Date  . LUMBAR MICRODISCECTOMY  ~ 2004   Dr Ellene Route       Home Medications    Prior to Admission medications   Medication Sig Start Date End Date Taking? Authorizing Provider  amLODipine (NORVASC) 10 MG tablet Take 1 tablet (10 mg total) by mouth daily. 02/27/17   Debbrah Alar, NP  atorvastatin (LIPITOR) 20 MG tablet Take 1 tablet (20 mg total) by mouth daily. 02/27/17   Debbrah Alar, NP  Blood Glucose Monitoring Suppl (FREESTYLE FREEDOM LITE) w/Device KIT 2 (two) times daily 02/27/17   Debbrah Alar, NP  cloNIDine (CATAPRES) 0.1 MG tablet Take 1 tablet (0.1 mg total) by mouth 3 (three) times daily. 02/27/17   Debbrah Alar, NP  escitalopram (LEXAPRO) 20 MG tablet Take 1 tablet (20 mg total) by mouth daily. 02/27/17   Debbrah Alar, NP  glucose blood (FREESTYLE LITE) test strip Use as instructed 02/27/17   Debbrah Alar, NP  hydrOXYzine (ATARAX/VISTARIL) 50 MG tablet Take 1 tablet (50 mg total) by mouth 3 (three) times daily as needed. 02/27/17  Debbrah Alar, NP  Insulin Glargine (LANTUS SOLOSTAR) 100 UNIT/ML Solostar Pen Inject 40 Units into the skin daily at 10 pm. 02/27/17   Debbrah Alar, NP  insulin lispro (HUMALOG KWIKPEN) 100 UNIT/ML KiwkPen 3 times a day (just before each meal) 09-10-08 units, 02/27/17   Debbrah Alar, NP  Insulin Pen Needle (ULTICARE SHORT PEN NEEDLES) 31G X 8 MM MISC Use once daily to inject insulin. 02/27/17   Debbrah Alar, NP  Lancets (FREESTYLE) lancets 2 (two) times daily 02/27/17   Debbrah Alar, NP  lisinopril (PRINIVIL,ZESTRIL) 20 MG tablet TAKE 1  TABLET (20 MG TOTAL) BY MOUTH DAILY. 02/27/17   Debbrah Alar, NP  Multiple Vitamins-Minerals (MULTIVITAMIN) tablet Take 1 tablet by mouth daily. 02/12/16   Palumbo, April, MD  pantoprazole (PROTONIX) 40 MG tablet Take 1 tablet (40 mg total) by mouth daily. 02/27/17   Debbrah Alar, NP  potassium chloride (K-DUR,KLOR-CON) 10 MEQ tablet Take 2 tablets (20 mEq total) by mouth daily. 02/27/17   Debbrah Alar, NP  QUEtiapine (SEROQUEL) 300 MG tablet Take 1 tablet (300 mg total) by mouth at bedtime. 02/27/17   Debbrah Alar, NP  tadalafil (CIALIS) 10 MG tablet Take 1 tablet (10 mg total) by mouth daily as needed for erectile dysfunction. 06/25/16   Debbrah Alar, NP  thiamine 100 MG tablet Take 1 tablet (100 mg total) by mouth daily. 02/27/17   Debbrah Alar, NP  triamcinolone cream (KENALOG) 0.1 % Apply 1 application topically 2 (two) times daily. 02/27/17   Debbrah Alar, NP  valACYclovir (VALTREX) 500 MG tablet Take 1 tablet (500 mg total) by mouth daily. 02/27/17   Debbrah Alar, NP  zolpidem (AMBIEN) 5 MG tablet Take 1 tablet (5 mg total) by mouth at bedtime as needed. for sleep 02/27/17   Debbrah Alar, NP    Family History Family History  Problem Relation Age of Onset  . Diabetes Mother   . Lung cancer Brother        twin brother  . Pancreatic cancer Paternal Aunt   . Colon cancer Neg Hx   . Stomach cancer Neg Hx     Social History Social History   Tobacco Use  . Smoking status: Current Every Day Smoker    Packs/day: 0.50    Types: Cigarettes    Start date: 04/29/2016  . Smokeless tobacco: Never Used  . Tobacco comment: 4 Cigarettes a day   Substance Use Topics  . Alcohol use: Yes    Comment: 3 weeks ago, last drink  . Drug use: No     Allergies   Invokamet [canagliflozin-metformin hcl] and Metformin and related   Review of Systems Review of Systems  Constitutional: Positive for chills. Negative for fever.  Respiratory:  Negative for shortness of breath.   Cardiovascular: Positive for chest pain.  Gastrointestinal: Positive for abdominal pain, nausea and vomiting. Negative for diarrhea.  Genitourinary: Positive for decreased urine volume and dysuria.  Musculoskeletal: Positive for back pain.  All other systems reviewed and are negative.    Physical Exam Updated Vital Signs BP (!) 151/97 (BP Location: Left Arm)   Pulse (!) 105   Temp 98.5 F (36.9 C) (Oral)   Resp 20   Ht _0  (1.803 m)   Wt 98.3 kg (216 lb 11.2 oz)   SpO2 100%   BMI 30.22 kg/m   Physical Exam  Constitutional: He is oriented to person, place, and time. He appears well-developed and well-nourished. He appears distressed.  HENT:  Head: Normocephalic and atraumatic.  Right Ear: External ear normal.  Left Ear: External ear normal.  Nose: Nose normal.  Eyes: Right eye exhibits no discharge. Left eye exhibits no discharge.  Neck: Neck supple.  Cardiovascular: Regular rhythm and normal heart sounds. Tachycardia present.  Pulmonary/Chest: Breath sounds normal. Tachypnea noted. He exhibits no tenderness.  No chest crepitus  Abdominal: Soft. He exhibits no distension. There is tenderness (mild) in the left upper quadrant. There is no rigidity, no rebound and no guarding.  Musculoskeletal: He exhibits no edema.  Neurological: He is alert and oriented to person, place, and time.  Skin: Skin is warm and dry. He is not diaphoretic.  Nursing note and vitals reviewed.    ED Treatments / Results  Labs (all labs ordered are listed, but only abnormal results are displayed) Labs Reviewed  CBC WITH DIFFERENTIAL/PLATELET - Abnormal; Notable for the following components:      Result Value   WBC 16.4 (*)    Platelets 460 (*)    Neutro Abs 14.6 (*)    All other components within normal limits  COMPREHENSIVE METABOLIC PANEL - Abnormal; Notable for the following components:   Sodium 132 (*)    Chloride 91 (*)    Glucose, Bld 320 (*)     Calcium 8.8 (*)    Total Protein 8.4 (*)    AST 83 (*)    ALT <5 (*)    Alkaline Phosphatase 135 (*)    Total Bilirubin 1.5 (*)    Anion gap 19 (*)    All other components within normal limits  URINALYSIS, ROUTINE W REFLEX MICROSCOPIC - Abnormal; Notable for the following components:   Glucose, UA >=500 (*)    Ketones, ur 40 (*)    All other components within normal limits  I-STAT CG4 LACTIC ACID, ED - Abnormal; Notable for the following components:   Lactic Acid, Venous 10.97 (*)    All other components within normal limits  CBG MONITORING, ED - Abnormal; Notable for the following components:   Glucose-Capillary 330 (*)    All other components within normal limits  I-STAT VENOUS BLOOD GAS, ED - Abnormal; Notable for the following components:   pH, Ven 7.451 (*)    pCO2, Ven 33.5 (*)    All other components within normal limits  I-STAT CG4 LACTIC ACID, ED - Abnormal; Notable for the following components:   Lactic Acid, Venous 3.04 (*)    All other components within normal limits  LIPASE, BLOOD  TROPONIN I  RAPID URINE DRUG SCREEN, HOSP PERFORMED  URINALYSIS, MICROSCOPIC (REFLEX)  CBG MONITORING, ED    EKG  EKG Interpretation  Date/Time:  Wednesday March 26 2017 17:36:00 EST Ventricular Rate:  124 PR Interval:    QRS Duration: 104 QT Interval:  336 QTC Calculation: 485 R Axis:   71 Text Interpretation:  Sinus tachycardia Low voltage with right axis deviation Minimal ST elevation, lateral leads Borderline prolonged QT interval rate is faster compared to Mar 2018 Confirmed by Sherwood Gambler [phone]) on 03/26/2017 5:48:01 PM       Radiology Ct Chest W Contrast  Result Date: 03/26/2017 CLINICAL DATA:  41 y/o M; 2 days of left upper quadrant and left-sided chest pain. Nausea, vomiting, acid reflux for 24 hours. EXAM: CT CHEST, ABDOMEN, AND PELVIS WITH CONTRAST TECHNIQUE: Multidetector CT imaging of the chest, abdomen and pelvis was performed following the standard  protocol during bolus administration of intravenous contrast. CONTRAST:  169m ISOVUE-300 IOPAMIDOL (ISOVUE-300) INJECTION 61% COMPARISON:  02/16/2016 CT abdomen  and pelvis. 03/22/2009 CT of the chest. FINDINGS: CT CHEST FINDINGS Cardiovascular: No significant vascular findings. Normal heart size. No pericardial effusion. Mediastinum/Nodes: No enlarged mediastinal, hilar, or axillary lymph nodes. Thyroid gland, trachea, and esophagus demonstrate no significant findings. Small hiatal hernia. Lungs/Pleura: Lungs are clear. No pleural effusion or pneumothorax. Musculoskeletal: No chest wall mass or suspicious bone lesions identified. CT ABDOMEN PELVIS FINDINGS Hepatobiliary: Hepatic steatosis and hepatomegaly. Stable 16 mm focus in segment 7 with enhancement following blood pool compatible with hemangioma (series 2, image 52). Stable subcentimeter lucency in segment 2, likely cyst (series 2, image 46). No other focal liver abnormality is seen. No gallstones, gallbladder wall thickening, or biliary dilatation. Pancreas: Pancreatic atrophy with calcifications compatible sequelae of chronic pancreatitis. Spleen: Normal in size without focal abnormality. Adrenals/Urinary Tract: Adrenal glands are unremarkable. Kidneys are normal, without renal calculi, focal lesion, or hydronephrosis. Bladder is unremarkable. Stomach/Bowel: Stomach is within normal limits. Appendix not identified, no pericecal inflammation. Mild sigmoid diverticulosis without findings of acute diverticulitis. No evidence of bowel wall thickening, distention, or inflammatory changes. Vascular/Lymphatic: No significant vascular findings are present. No enlarged abdominal or pelvic lymph nodes. Reproductive: Mild prostate enlargement. Other: No abdominal wall hernia or abnormality. No abdominopelvic ascites. Musculoskeletal: No acute or significant osseous findings. IMPRESSION: 1. No acute process identified. 2. Small hiatal hernia. 3. Hepatic steatosis and  hepatomegaly. 4. 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 5. Pancreatic atrophy with calcifications compatible with sequelae of chronic pancreatitis. 6. Sigmoid diverticulosis.  No findings of acute diverticulitis. Electronically Signed   By: Kristine Garbe M.D.   On: 03/26/2017 18:47   Ct Abdomen Pelvis W Contrast  Result Date: 03/26/2017 CLINICAL DATA:  41 y/o M; 2 days of left upper quadrant and left-sided chest pain. Nausea, vomiting, acid reflux for 24 hours. EXAM: CT CHEST, ABDOMEN, AND PELVIS WITH CONTRAST TECHNIQUE: Multidetector CT imaging of the chest, abdomen and pelvis was performed following the standard protocol during bolus administration of intravenous contrast. CONTRAST:  174m ISOVUE-300 IOPAMIDOL (ISOVUE-300) INJECTION 61% COMPARISON:  02/16/2016 CT abdomen and pelvis. 03/22/2009 CT of the chest. FINDINGS: CT CHEST FINDINGS Cardiovascular: No significant vascular findings. Normal heart size. No pericardial effusion. Mediastinum/Nodes: No enlarged mediastinal, hilar, or axillary lymph nodes. Thyroid gland, trachea, and esophagus demonstrate no significant findings. Small hiatal hernia. Lungs/Pleura: Lungs are clear. No pleural effusion or pneumothorax. Musculoskeletal: No chest wall mass or suspicious bone lesions identified. CT ABDOMEN PELVIS FINDINGS Hepatobiliary: Hepatic steatosis and hepatomegaly. Stable 16 mm focus in segment 7 with enhancement following blood pool compatible with hemangioma (series 2, image 52). Stable subcentimeter lucency in segment 2, likely cyst (series 2, image 46). No other focal liver abnormality is seen. No gallstones, gallbladder wall thickening, or biliary dilatation. Pancreas: Pancreatic atrophy with calcifications compatible sequelae of chronic pancreatitis. Spleen: Normal in size without focal abnormality. Adrenals/Urinary Tract: Adrenal glands are unremarkable. Kidneys are normal, without renal calculi, focal  lesion, or hydronephrosis. Bladder is unremarkable. Stomach/Bowel: Stomach is within normal limits. Appendix not identified, no pericecal inflammation. Mild sigmoid diverticulosis without findings of acute diverticulitis. No evidence of bowel wall thickening, distention, or inflammatory changes. Vascular/Lymphatic: No significant vascular findings are present. No enlarged abdominal or pelvic lymph nodes. Reproductive: Mild prostate enlargement. Other: No abdominal wall hernia or abnormality. No abdominopelvic ascites. Musculoskeletal: No acute or significant osseous findings. IMPRESSION: 1. No acute process identified. 2. Small hiatal hernia. 3. Hepatic steatosis and hepatomegaly. 4. 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 5. Pancreatic atrophy with calcifications compatible with sequelae of chronic pancreatitis. 6. Sigmoid diverticulosis.  No findings of acute diverticulitis. Electronically Signed   By: Kristine Garbe M.D.   On: 03/26/2017 18:47   Dg Chest Portable 1 View  Result Date: 03/26/2017 CLINICAL DATA:  41 y/o M; left-sided chest pain and severe vomiting. EXAM: PORTABLE CHEST 1 VIEW COMPARISON:  07/18/2016 chest radiograph FINDINGS: Stable normal cardiac silhouette given projection and technique. Low lung volumes accentuate pulmonary markings. Clear lungs. No pleural effusion or pneumothorax. No acute osseous abnormality is evident. IMPRESSION: No acute pulmonary process identified. Electronically Signed   By: Kristine Garbe M.D.   On: 03/26/2017 18:34    Procedures .Critical Care Performed by: Sherwood Gambler, MD Authorized by: Sherwood Gambler, MD   Critical care provider statement:    Critical care time (minutes):  30   Critical care was necessary to treat or prevent imminent or life-threatening deterioration of the following conditions:  Shock and metabolic crisis   Critical care was time spent personally by me on the following  activities:  Development of treatment plan with patient or surrogate, discussions with consultants, evaluation of patient's response to treatment, examination of patient, ordering and performing treatments and interventions, ordering and review of laboratory studies, ordering and review of radiographic studies, pulse oximetry, re-evaluation of patient's condition and review of old charts   (including critical care time)  Medications Ordered in ED Medications  0.9 %  sodium chloride infusion ( Intravenous Transfusing/Transfer 03/26/17 2222)  pneumococcal 23 valent vaccine (PNU-IMMUNE) injection 0.5 mL (not administered)  sodium chloride 0.9 % bolus 1,000 mL (0 mLs Intravenous Stopped 03/26/17 1854)  ondansetron (ZOFRAN) injection 4 mg (4 mg Intravenous Given 03/26/17 1719)  sodium chloride 0.9 % bolus 1,000 mL (0 mLs Intravenous Stopped 03/26/17 1854)  HYDROmorphone (DILAUDID) injection 1 mg (1 mg Intravenous Given 03/26/17 1744)  promethazine (PHENERGAN) injection 25 mg (25 mg Intravenous Given 03/26/17 1818)  iopamidol (ISOVUE-300) 61 % injection 100 mL (100 mLs Intravenous Contrast Given 03/26/17 1821)  sodium chloride 0.9 % bolus 1,000 mL (0 mLs Intravenous Stopped 03/26/17 1959)  famotidine (PEPCID) IVPB 20 mg premix (0 mg Intravenous Stopped 03/26/17 2100)  HYDROmorphone (DILAUDID) injection 1 mg (1 mg Intravenous Given 03/26/17 2100)     Initial Impression / Assessment and Plan / ED Course  I have reviewed the triage vital signs and the nursing notes.  Pertinent labs & imaging results that were available during my care of the patient were reviewed by me and considered in my medical decision making (see chart for details).      Unclear why the patient has such significant lactic acidosis.  Initially when he presents he does not appear well, is tachypneic, and having rigors.  However he is afebrile.  After being given fluids and then Phenergan, his nausea is much better and he is now  resting comfortably with no tachypnea and no distress.  His abdominal exam is benign but given the chest pain that started after vomiting which such a high lactate, CT is obtained to help rule out esophageal perforation or abdominal perforation.  These are benign.  He was given multiple fluid boluses.  Given the overall picture, while he has significant improved, I think he needs to continue to be observed with further fluids.  He had a similar lactic acidosis but not as severe a couple years ago and was taken off metformin and is no longer on this.  Unclear  where the lactate is coming from but I do not think is from sepsis.  Transfer to [facility] for admission for further fluids and supportive care.  Final Clinical Impressions(s) / ED Diagnoses   Final diagnoses:  Lactic acidosis    ED Discharge Orders    None       Sherwood Gambler, MD 03/26/17 2342

## 2017-03-26 NOTE — ED Triage Notes (Signed)
Pt c/o left upper abd pain with n/v x 24 hrs

## 2017-03-26 NOTE — Progress Notes (Signed)
Patient is a 41yo with h/o ETOH abuse, alcoholic pancreatitis and type 2 DM presenting with severe vomiting x 1 day.   He denies ETOH daily, no ETOH 4-5 days.  LUQ and left chest pain.  Somewhat hypertensive, tachypnic with normal sats.  Looks much better now.  CT to r/o esophageal perforation.  Lactate 10+.  Repeat lactate still 3.  He was seen in the ER 2 years ago with similar presentation, taken off Metformin and Invokana (and did not resume).  No obvious source of sepsis - negative UA and CXR.  Possibly just severe dehydration but with minimal ketones and scant elevation in creatinine.  Negative troponin but with c/o chest pain.  Will observe on telemetry for now since symptoms improved and lactate is clearing.   Carlyon Shadow, M.D.

## 2017-03-27 ENCOUNTER — Other Ambulatory Visit: Payer: Self-pay

## 2017-03-27 DIAGNOSIS — R112 Nausea with vomiting, unspecified: Secondary | ICD-10-CM | POA: Diagnosis not present

## 2017-03-27 DIAGNOSIS — I10 Essential (primary) hypertension: Secondary | ICD-10-CM | POA: Diagnosis not present

## 2017-03-27 DIAGNOSIS — F39 Unspecified mood [affective] disorder: Secondary | ICD-10-CM

## 2017-03-27 DIAGNOSIS — E1165 Type 2 diabetes mellitus with hyperglycemia: Secondary | ICD-10-CM

## 2017-03-27 DIAGNOSIS — F1010 Alcohol abuse, uncomplicated: Secondary | ICD-10-CM

## 2017-03-27 DIAGNOSIS — R079 Chest pain, unspecified: Secondary | ICD-10-CM | POA: Diagnosis present

## 2017-03-27 DIAGNOSIS — Z720 Tobacco use: Secondary | ICD-10-CM | POA: Diagnosis not present

## 2017-03-27 DIAGNOSIS — E872 Acidosis: Secondary | ICD-10-CM

## 2017-03-27 DIAGNOSIS — R0789 Other chest pain: Secondary | ICD-10-CM | POA: Diagnosis not present

## 2017-03-27 DIAGNOSIS — R1110 Vomiting, unspecified: Secondary | ICD-10-CM | POA: Diagnosis present

## 2017-03-27 DIAGNOSIS — K219 Gastro-esophageal reflux disease without esophagitis: Secondary | ICD-10-CM | POA: Diagnosis not present

## 2017-03-27 DIAGNOSIS — E860 Dehydration: Secondary | ICD-10-CM | POA: Diagnosis not present

## 2017-03-27 LAB — CBC
HCT: 35.1 % — ABNORMAL LOW (ref 39.0–52.0)
Hemoglobin: 12.5 g/dL — ABNORMAL LOW (ref 13.0–17.0)
MCH: 29.5 pg (ref 26.0–34.0)
MCHC: 35.6 g/dL (ref 30.0–36.0)
MCV: 82.8 fL (ref 78.0–100.0)
PLATELETS: 290 10*3/uL (ref 150–400)
RBC: 4.24 MIL/uL (ref 4.22–5.81)
RDW: 14.3 % (ref 11.5–15.5)
WBC: 9.4 10*3/uL (ref 4.0–10.5)

## 2017-03-27 LAB — BASIC METABOLIC PANEL
Anion gap: 6 (ref 5–15)
BUN: 9 mg/dL (ref 6–20)
CALCIUM: 7.8 mg/dL — AB (ref 8.9–10.3)
CO2: 27 mmol/L (ref 22–32)
CREATININE: 0.75 mg/dL (ref 0.61–1.24)
Chloride: 105 mmol/L (ref 101–111)
GFR calc non Af Amer: >60 mL/min (ref >60)
Glucose, Bld: 135 mg/dL — ABNORMAL HIGH (ref 65–99)
Potassium: 3 mmol/L — ABNORMAL LOW (ref 3.5–5.1)
SODIUM: 138 mmol/L (ref 135–145)

## 2017-03-27 LAB — GLUCOSE, CAPILLARY
GLUCOSE-CAPILLARY: 116 mg/dL — AB (ref 65–99)
GLUCOSE-CAPILLARY: 205 mg/dL — AB (ref 65–99)
GLUCOSE-CAPILLARY: 63 mg/dL — AB (ref 65–99)
GLUCOSE-CAPILLARY: 73 mg/dL (ref 65–99)
GLUCOSE-CAPILLARY: 86 mg/dL (ref 65–99)
Glucose-Capillary: 67 mg/dL (ref 65–99)

## 2017-03-27 LAB — LACTIC ACID, PLASMA
LACTIC ACID, VENOUS: 1.2 mmol/L (ref 0.5–1.9)
LACTIC ACID, VENOUS: 0.9 mmol/L (ref 0.5–1.9)

## 2017-03-27 LAB — TROPONIN I
Troponin I: <0.03 ng/mL (ref <0.03)
Troponin I: <0.03 ng/mL (ref <0.03)

## 2017-03-27 LAB — PROCALCITONIN: Procalcitonin: 0.14 ng/mL

## 2017-03-27 MED ORDER — DEXTROSE 50 % IV SOLN: 25.0000 mL | Freq: Once | INTRAVENOUS | Status: AC

## 2017-03-27 MED ORDER — ONDANSETRON 8 MG PO TBDP: 8.0000 mg | ORAL_TABLET | Freq: Three times a day (TID) | ORAL | 0 refills | Status: DC | PRN

## 2017-03-27 MED ORDER — ACETAMINOPHEN 650 MG RE SUPP: 650.0000 mg | Freq: Four times a day (QID) | RECTAL | Status: DC | PRN

## 2017-03-27 MED ORDER — DEXTROSE 50 % IV SOLN
INTRAVENOUS | Status: AC
  Administered 2017-03-27: 25 mL
  Filled 2017-03-27: qty 50

## 2017-03-27 MED ORDER — INSULIN ASPART 100 UNIT/ML ~~LOC~~ SOLN: 0.0000 [IU] | Freq: Three times a day (TID) | SUBCUTANEOUS | Status: DC

## 2017-03-27 MED ORDER — ONDANSETRON HCL 4 MG/2ML IJ SOLN: 4.0000 mg | Freq: Four times a day (QID) | INTRAMUSCULAR | Status: DC | PRN

## 2017-03-27 MED ORDER — PANTOPRAZOLE SODIUM 40 MG IV SOLR: 40.0000 mg | Freq: Two times a day (BID) | INTRAVENOUS | Status: DC

## 2017-03-27 MED ORDER — METHOCARBAMOL 500 MG PO TABS: 500.0000 mg | ORAL_TABLET | Freq: Three times a day (TID) | ORAL | Status: DC | PRN

## 2017-03-27 MED ORDER — SUCRALFATE 1 GM/10ML PO SUSP: 1.0000 g | Freq: Three times a day (TID) | ORAL | 0 refills | Status: DC

## 2017-03-27 MED ORDER — SODIUM CHLORIDE 0.9 % IV SOLN
80.0000 mg | Freq: Once | INTRAVENOUS | Status: AC
  Administered 2017-03-27: 01:00:00 80 mg via INTRAVENOUS
  Filled 2017-03-27: qty 80

## 2017-03-27 MED ORDER — ACETAMINOPHEN 325 MG PO TABS: 650.0000 mg | ORAL_TABLET | Freq: Four times a day (QID) | ORAL | Status: DC | PRN

## 2017-03-27 MED ORDER — ESCITALOPRAM OXALATE 20 MG PO TABS
20.0000 mg | ORAL_TABLET | Freq: Every day | ORAL | Status: DC
  Administered 2017-03-27: 20 mg via ORAL
  Filled 2017-03-27: qty 1

## 2017-03-27 MED ORDER — ORAL CARE MOUTH RINSE: 15.0000 mL | Freq: Two times a day (BID) | OROMUCOSAL | Status: DC

## 2017-03-27 MED ORDER — PANTOPRAZOLE SODIUM 40 MG PO TBEC: 40.0000 mg | DELAYED_RELEASE_TABLET | Freq: Two times a day (BID) | ORAL | 1 refills | Status: DC

## 2017-03-27 MED ORDER — ZOLPIDEM TARTRATE 5 MG PO TABS: 5.0000 mg | ORAL_TABLET | Freq: Every evening | ORAL | Status: DC | PRN

## 2017-03-27 MED ORDER — HYDROMORPHONE HCL 1 MG/ML IJ SOLN
1.0000 mg | Freq: Once | INTRAMUSCULAR | Status: AC | PRN
  Administered 2017-03-27: 1 mg via INTRAVENOUS
  Filled 2017-03-27: qty 1

## 2017-03-27 MED ORDER — POTASSIUM CHLORIDE CRYS ER 20 MEQ PO TBCR
40.0000 meq | EXTENDED_RELEASE_TABLET | Freq: Two times a day (BID) | ORAL | Status: DC
  Administered 2017-03-27: 40 meq via ORAL
  Filled 2017-03-27: qty 2

## 2017-03-27 MED ORDER — LISINOPRIL 20 MG PO TABS
20.0000 mg | ORAL_TABLET | Freq: Every day | ORAL | Status: DC
  Administered 2017-03-27: 20 mg via ORAL
  Filled 2017-03-27: qty 1

## 2017-03-27 MED ORDER — HYDROXYZINE HCL 50 MG PO TABS
50.0000 mg | ORAL_TABLET | Freq: Three times a day (TID) | ORAL | Status: DC | PRN
  Administered 2017-03-27: 50 mg via ORAL
  Filled 2017-03-27: qty 1

## 2017-03-27 MED ORDER — AMLODIPINE BESYLATE 10 MG PO TABS
10.0000 mg | ORAL_TABLET | Freq: Every day | ORAL | Status: DC
  Administered 2017-03-27: 10 mg via ORAL
  Filled 2017-03-27: qty 1

## 2017-03-27 MED ORDER — VITAMIN B-1 100 MG PO TABS
100.0000 mg | ORAL_TABLET | Freq: Every day | ORAL | Status: DC
  Administered 2017-03-27: 100 mg via ORAL
  Filled 2017-03-27: qty 1

## 2017-03-27 MED ORDER — VALACYCLOVIR HCL 500 MG PO TABS
500.0000 mg | ORAL_TABLET | Freq: Every day | ORAL | Status: DC
  Administered 2017-03-27: 500 mg via ORAL
  Filled 2017-03-27: qty 1

## 2017-03-27 MED ORDER — PROMETHAZINE HCL 25 MG/ML IJ SOLN
12.5000 mg | Freq: Four times a day (QID) | INTRAMUSCULAR | Status: DC | PRN
  Administered 2017-03-27: 12.5 mg via INTRAVENOUS
  Filled 2017-03-27: qty 1

## 2017-03-27 MED ORDER — FAMOTIDINE IN NACL 20-0.9 MG/50ML-% IV SOLN
20.0000 mg | Freq: Two times a day (BID) | INTRAVENOUS | Status: DC | PRN
  Administered 2017-03-27: 20 mg via INTRAVENOUS
  Filled 2017-03-27: qty 50

## 2017-03-27 MED ORDER — ATORVASTATIN CALCIUM 20 MG PO TABS: 20.0000 mg | ORAL_TABLET | Freq: Every day | ORAL | Status: DC

## 2017-03-27 MED ORDER — CLONIDINE HCL 0.1 MG PO TABS
0.1000 mg | ORAL_TABLET | Freq: Three times a day (TID) | ORAL | Status: DC
  Administered 2017-03-27: 0.1 mg via ORAL
  Filled 2017-03-27: qty 1

## 2017-03-27 MED ORDER — QUETIAPINE FUMARATE 200 MG PO TABS
300.0000 mg | ORAL_TABLET | Freq: Every day | ORAL | Status: DC
  Administered 2017-03-27: 300 mg via ORAL
  Filled 2017-03-27: qty 1

## 2017-03-27 MED ORDER — SODIUM CHLORIDE 0.9% FLUSH
3.0000 mL | Freq: Two times a day (BID) | INTRAVENOUS | Status: DC
  Administered 2017-03-27: 3 mL via INTRAVENOUS

## 2017-03-27 MED ORDER — INSULIN GLARGINE 100 UNIT/ML ~~LOC~~ SOLN
40.0000 [IU] | Freq: Every day | SUBCUTANEOUS | Status: DC
  Administered 2017-03-27: 40 [IU] via SUBCUTANEOUS
  Filled 2017-03-27 (×2): qty 0.4

## 2017-03-27 MED ORDER — SUCRALFATE 1 GM/10ML PO SUSP
1.0000 g | Freq: Three times a day (TID) | ORAL | Status: DC
  Administered 2017-03-27 (×2): 1 g via ORAL
  Filled 2017-03-27 (×2): qty 10

## 2017-03-27 MED ORDER — CHLORHEXIDINE GLUCONATE 0.12 % MT SOLN
15.0000 mL | Freq: Two times a day (BID) | OROMUCOSAL | Status: DC
  Administered 2017-03-27: 15 mL via OROMUCOSAL
  Filled 2017-03-27: qty 15

## 2017-03-27 MED ORDER — INSULIN ASPART 100 UNIT/ML ~~LOC~~ SOLN
0.0000 [IU] | Freq: Every day | SUBCUTANEOUS | Status: DC
  Administered 2017-03-27: 2 [IU] via SUBCUTANEOUS

## 2017-03-27 MED ORDER — METHOCARBAMOL 500 MG PO TABS: 500.0000 mg | ORAL_TABLET | Freq: Three times a day (TID) | ORAL | 0 refills | Status: DC | PRN

## 2017-03-27 MED ORDER — ENOXAPARIN SODIUM 40 MG/0.4ML ~~LOC~~ SOLN
40.0000 mg | SUBCUTANEOUS | Status: DC
  Administered 2017-03-27: 40 mg via SUBCUTANEOUS
  Filled 2017-03-27 (×2): qty 0.4

## 2017-03-27 MED FILL — METHOCARBAMOL 500 MG TABLET: 500 | 10 days supply | Qty: 30 | Fill #0

## 2017-03-27 MED FILL — CARAFATE 1 GM/10 ML SUSP: 1 | 14 days supply | Qty: 420 | Fill #0

## 2017-03-27 MED FILL — ONDANSETRON ODT 8 MG TABLET: 8 | 7 days supply | Qty: 20 | Fill #0

## 2017-03-27 MED FILL — PANTOPRAZOLE SOD DR 40 MG T: 40 | 30 days supply | Qty: 60 | Fill #0

## 2017-03-27 NOTE — Progress Notes (Signed)
Hypoglycemic Event  CBG: 63 @1140   Treatment: 15 GM carbohydrate snack  Symptoms: Hungry  Follow-up CBG: Time:1205 CBG Result: 86  Possible Reasons for Event: Inadequate meal intake  Comments/MD notified:Dr [place]era in person (MD changed diet orders to carb mod. Patient had apple juice and lunch was ordered)    [name], [name]

## 2017-03-27 NOTE — Progress Notes (Signed)
Hypoglycemic Event  CBG: 67 (CBG started at 73, RN asked NT to recheck in 30 minutes to make sure pt. Didn't drop and CBG was 67)  Treatment: D50 IV 25 mL  Symptoms: None (only complaint of epigastric/CP radiating to back-MD made aware of this)  Follow-up CBG: Time: 0900 CBG Result: 116  Possible Reasons for Event: Inadequate meal intake-NPO  Comments/MD notified: MD paged.    Katieann Hungate, [name]

## 2017-03-27 NOTE — Discharge Summary (Signed)
Physician Discharge Summary  ERNST CUMPSTON LPN:[id number] DOB: [birth year] DOA: 03/26/2017  PCP: Debbrah Alar, NP  Admit date: 03/26/2017 Discharge date: 03/27/2017  Time spent: 35 minutes  Recommendations for Outpatient Follow-up:  1. Repeat basic metabolic panel to assess electrolytes and renal function 2. Close follow-up to patient's CBGs A1c with further adjustment on his hypoglycemic regimen as needed. 3. Re-access resolution of patient's epigastric pain and reflux symptoms.   Discharge Diagnoses:  Principal Problem:   Lactic acidosis Active Problems:   Diabetes type 2, uncontrolled (HCC)   Essential hypertension   Alcohol abuse   GERD (gastroesophageal reflux disease)   Mood disorder (HCC)   Tobacco abuse   Vomiting alone   Chest pain   Nausea and vomiting   Discharge Condition: Stable and improved.  Patient discharged home with instructions to follow-up with PCP in 10 days.  No acute complaints and able to tolerate diet prior to discharge.  Diet recommendation: Modify carbohydrates and heart healthy diet.  Filed Weights   03/26/17 1706 03/26/17 2323  Weight: 90.7 kg (200 lb) 98.3 kg (216 lb 11.2 oz)    History of present illness:  41 y.o. male with medical history significant of ETOH abuse, alcoholic pancreatitis and type 2 DM presenting with severe vomiting x 1 day.  He was unable to keep anything down.  He was drinking water and it just kept coming back up.  He felt very weak with chills.  Midepigastric abdominal pain that started last night, as well.  He was barely able to walk.  [facility] emesis was at [facility].  No hematemesis.  He is thinking that maybe he had food poisoning after eating leftovers from Thanksgiving (he made his own Thanksgiving dinner).  He works at [employer] and reported some coworkers were sick.  Hospital Course:  1-nausea/vomiting: Which appears to be secondary to viral gastroenteritis. -Patient's symptoms significantly improve/resolve  prior to discharge was able to tolerate diet and was no complaining of any nausea or vomiting. -Given symptoms of reflux and burning sensation in his mid epigastric area; has been discharged on PPI twice a day and Carafate. -Patient also receive a prescription for as needed Zofran ODT -Instructed to keep himself well-hydrated and to avoid the use of alcohol.  2-lactic acidosis and dehydration -Completely resolve with aggressive fluid resuscitation -Patient advised to keep himself hydrated to avoid alcohol consumption.  3-chest pain: Noncardiac -Associated most likely with reflux symptoms and acute vomiting episodes. -No abnormality seen on CT chest with contrast -Medications given to control GI culprit for his symptoms.  4-poorly controlled type 2 diabetes: on Insulin therapy -Presenting with hyperglycemia -A1c in the 12 range -Patient instructed to follow modified carbohydrates diet -Patient will need further adjustment on his hypoglycemic event as an outpatient.  5-history of alcohol abuse: -Patient has a history of prior episode of pancreatitis related to alcohol abuse; and he still intermittently drinking. -Denies any alcohol in the last 3 days -No signs of withdrawal appreciated during hospitalization -Alcohol cessation has been counseled  6-tobacco abuse: -Smoking cessation counseling has been provided  7-mood disorder -Continue Lexapro, Atarax, Seroquel and as needed Ambien. -Continue follow-up with psychiatry as an outpatient.  Procedures:  See below for x-ray reports.  Consultations:  None  Discharge Exam: Vitals:   03/27/17 0521 03/27/17 1300  BP: (!) 134/94 130/86  Pulse: 99 99  Resp: 16 18  Temp: 98.4 F (36.9 C) 99.1 F (37.3 C)  SpO2: 100% 100%    General: Afebrile, no shortness  of breath, patient reports no further nausea or vomiting.  Able to tolerate diet prior to discharge and was not having any acute complaints. Cardiovascular: S1 and S2, no  rubs, no gallops Respiratory: Good air movement bilaterally, no wheezing, no crackles Abdomen: Vague epigastric discomfort, positive bowel sounds, no guarding, no distention. Extremities: No edema, no cyanosis, no clubbing.  Discharge Instructions   Discharge Instructions    Diet - low sodium heart healthy   Complete by:  As directed    Diet Carb Modified   Complete by:  As directed    Discharge instructions   Complete by:  As directed    Take medications as prescribed. Arrange follow-up with PCP in 10 days Keep yourself well hydrated Please follow-up modify carbohydrate and heart healthy diet Be compliant with medications, especially insulin regimen.     Allergies as of 03/27/2017      Reactions   Invokamet [canagliflozin-metformin Hcl] Other (See Comments)   Lactic acidosis   Metformin And Related    Drastic drop in blood sugar      Medication List    TAKE these medications   amLODipine 10 MG tablet Commonly known as:  NORVASC Take 1 tablet (10 mg total) by mouth daily.   atorvastatin 20 MG tablet Commonly known as:  LIPITOR Take 1 tablet (20 mg total) by mouth daily.   cloNIDine 0.1 MG tablet Commonly known as:  CATAPRES Take 1 tablet (0.1 mg total) by mouth 3 (three) times daily.   escitalopram 20 MG tablet Commonly known as:  LEXAPRO Take 1 tablet (20 mg total) by mouth daily.   FREESTYLE FREEDOM LITE w/Device Kit 2 (two) times daily   freestyle lancets 2 (two) times daily   glucose blood test strip Commonly known as:  FREESTYLE LITE Use as instructed   hydrOXYzine 50 MG tablet Commonly known as:  ATARAX/VISTARIL Take 1 tablet (50 mg total) by mouth 3 (three) times daily as needed.   Insulin Glargine 100 UNIT/ML Solostar Pen Commonly known as:  LANTUS SOLOSTAR Inject 40 Units into the skin daily at 10 pm.   insulin lispro 100 UNIT/ML KiwkPen Commonly known as:  HUMALOG KWIKPEN 3 times a day (just before each meal) 09-10-08 units, What changed:     how much to take  how to take this  when to take this  additional instructions   Insulin Pen Needle 31G X 8 MM Misc Commonly known as:  ULTICARE SHORT PEN NEEDLES Use once daily to inject insulin.   lisinopril 20 MG tablet Commonly known as:  PRINIVIL,ZESTRIL TAKE 1 TABLET (20 MG TOTAL) BY MOUTH DAILY.   methocarbamol 500 MG tablet Commonly known as:  ROBAXIN Take 1 tablet (500 mg total) by mouth every 8 (eight) hours as needed for muscle spasms.   multivitamin tablet Take 1 tablet by mouth daily.   ondansetron 8 MG disintegrating tablet Commonly known as:  ZOFRAN ODT Take 1 tablet (8 mg total) by mouth every 8 (eight) hours as needed for nausea or vomiting.   pantoprazole 40 MG tablet Commonly known as:  PROTONIX Take 1 tablet (40 mg total) by mouth 2 (two) times daily. What changed:  when to take this   potassium chloride 10 MEQ tablet Commonly known as:  K-DUR,KLOR-CON Take 2 tablets (20 mEq total) by mouth daily.   QUEtiapine 300 MG tablet Commonly known as:  SEROQUEL Take 1 tablet (300 mg total) by mouth at bedtime.   sucralfate 1 GM/10ML suspension Commonly known as:  CARAFATE Take 10 mLs (1 g total) by mouth 3 (three) times daily.   tadalafil 10 MG tablet Commonly known as:  CIALIS Take 1 tablet (10 mg total) by mouth daily as needed for erectile dysfunction.   thiamine 100 MG tablet Take 1 tablet (100 mg total) by mouth daily.   triamcinolone cream 0.1 % Commonly known as:  KENALOG Apply 1 application topically 2 (two) times daily.   valACYclovir 500 MG tablet Commonly known as:  VALTREX Take 1 tablet (500 mg total) by mouth daily.   zolpidem 5 MG tablet Commonly known as:  AMBIEN Take 1 tablet (5 mg total) by mouth at bedtime as needed. for sleep      Allergies  Allergen Reactions  . Invokamet [Canagliflozin-Metformin Hcl] Other (See Comments)    Lactic acidosis  . Metformin And Related     Drastic drop in blood sugar   Follow-up  Information    Debbrah Alar, NP. Schedule an appointment as soon as possible for a visit in 10 day(s).   Specialty:  Internal Medicine Contact information: [address] [place] [address] [phone]           The results of significant diagnostics from this hospitalization (including imaging, microbiology, ancillary and laboratory) are listed below for reference.    Significant Diagnostic Studies: Ct Chest W Contrast  Result Date: 03/26/2017 CLINICAL DATA:  41 y/o M; 2 days of left upper quadrant and left-sided chest pain. Nausea, vomiting, acid reflux for 24 hours. EXAM: CT CHEST, ABDOMEN, AND PELVIS WITH CONTRAST TECHNIQUE: Multidetector CT imaging of the chest, abdomen and pelvis was performed following the standard protocol during bolus administration of intravenous contrast. CONTRAST:  167m ISOVUE-300 IOPAMIDOL (ISOVUE-300) INJECTION 61% COMPARISON:  02/16/2016 CT abdomen and pelvis. 03/22/2009 CT of the chest. FINDINGS: CT CHEST FINDINGS Cardiovascular: No significant vascular findings. Normal heart size. No pericardial effusion. Mediastinum/Nodes: No enlarged mediastinal, hilar, or axillary lymph nodes. Thyroid gland, trachea, and esophagus demonstrate no significant findings. Small hiatal hernia. Lungs/Pleura: Lungs are clear. No pleural effusion or pneumothorax. Musculoskeletal: No chest wall mass or suspicious bone lesions identified. CT ABDOMEN PELVIS FINDINGS Hepatobiliary: Hepatic steatosis and hepatomegaly. Stable 16 mm focus in segment 7 with enhancement following blood pool compatible with hemangioma (series 2, image 52). Stable subcentimeter lucency in segment 2, likely cyst (series 2, image 46). No other focal liver abnormality is seen. No gallstones, gallbladder wall thickening, or biliary dilatation. Pancreas: Pancreatic atrophy with calcifications compatible sequelae of chronic pancreatitis. Spleen: Normal in size without focal abnormality.  Adrenals/Urinary Tract: Adrenal glands are unremarkable. Kidneys are normal, without renal calculi, focal lesion, or hydronephrosis. Bladder is unremarkable. Stomach/Bowel: Stomach is within normal limits. Appendix not identified, no pericecal inflammation. Mild sigmoid diverticulosis without findings of acute diverticulitis. No evidence of bowel wall thickening, distention, or inflammatory changes. Vascular/Lymphatic: No significant vascular findings are present. No enlarged abdominal or pelvic lymph nodes. Reproductive: Mild prostate enlargement. Other: No abdominal wall hernia or abnormality. No abdominopelvic ascites. Musculoskeletal: No acute or significant osseous findings. IMPRESSION: 1. No acute process identified. 2. Small hiatal hernia. 3. Hepatic steatosis and hepatomegaly. 4. 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 5. Pancreatic atrophy with calcifications compatible with sequelae of chronic pancreatitis. 6. Sigmoid diverticulosis.  No findings of acute diverticulitis. Electronically Signed   By: LKristine GarbeM.D.   On: 03/26/2017 18:47   Ct Abdomen Pelvis W Contrast  Result Date: 03/26/2017 CLINICAL DATA:  41 y/o M; 2 days of left upper quadrant and left-sided chest pain. Nausea, vomiting, acid reflux for 24 hours. EXAM: CT CHEST, ABDOMEN, AND PELVIS WITH CONTRAST TECHNIQUE: Multidetector CT imaging of the chest, abdomen and pelvis was performed following the standard protocol during bolus administration of intravenous contrast. CONTRAST:  179m ISOVUE-300 IOPAMIDOL (ISOVUE-300) INJECTION 61% COMPARISON:  02/16/2016 CT abdomen and pelvis. 03/22/2009 CT of the chest. FINDINGS: CT CHEST FINDINGS Cardiovascular: No significant vascular findings. Normal heart size. No pericardial effusion. Mediastinum/Nodes: No enlarged mediastinal, hilar, or axillary lymph nodes. Thyroid gland, trachea, and esophagus demonstrate no significant findings. Small hiatal  hernia. Lungs/Pleura: Lungs are clear. No pleural effusion or pneumothorax. Musculoskeletal: No chest wall mass or suspicious bone lesions identified. CT ABDOMEN PELVIS FINDINGS Hepatobiliary: Hepatic steatosis and hepatomegaly. Stable 16 mm focus in segment 7 with enhancement following blood pool compatible with hemangioma (series 2, image 52). Stable subcentimeter lucency in segment 2, likely cyst (series 2, image 46). No other focal liver abnormality is seen. No gallstones, gallbladder wall thickening, or biliary dilatation. Pancreas: Pancreatic atrophy with calcifications compatible sequelae of chronic pancreatitis. Spleen: Normal in size without focal abnormality. Adrenals/Urinary Tract: Adrenal glands are unremarkable. Kidneys are normal, without renal calculi, focal lesion, or hydronephrosis. Bladder is unremarkable. Stomach/Bowel: Stomach is within normal limits. Appendix not identified, no pericecal inflammation. Mild sigmoid diverticulosis without findings of acute diverticulitis. No evidence of bowel wall thickening, distention, or inflammatory changes. Vascular/Lymphatic: No significant vascular findings are present. No enlarged abdominal or pelvic lymph nodes. Reproductive: Mild prostate enlargement. Other: No abdominal wall hernia or abnormality. No abdominopelvic ascites. Musculoskeletal: No acute or significant osseous findings. IMPRESSION: 1. No acute process identified. 2. Small hiatal hernia. 3. Hepatic steatosis and hepatomegaly. 4. 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 5. Pancreatic atrophy with calcifications compatible with sequelae of chronic pancreatitis. 6. Sigmoid diverticulosis.  No findings of acute diverticulitis. Electronically Signed   By: LKristine GarbeM.D.   On: 03/26/2017 18:47   Dg Chest Portable 1 View  Result Date: 03/26/2017 CLINICAL DATA:  41y/o M; left-sided chest pain and severe vomiting. EXAM: PORTABLE CHEST 1 VIEW  COMPARISON:  07/18/2016 chest radiograph FINDINGS: Stable normal cardiac silhouette given projection and technique. Low lung volumes accentuate pulmonary markings. Clear lungs. No pleural effusion or pneumothorax. No acute osseous abnormality is evident. IMPRESSION: No acute pulmonary process identified. Electronically Signed   By: LKristine GarbeM.D.   On: 03/26/2017 18:34   Labs: Basic Metabolic Panel: Recent Labs  Lab 03/26/17 1710 03/27/17 0404  NA 132* 138  K 4.1 3.0*  CL 91* 105  CO2 22 27  GLUCOSE 320* 135*  BUN 12 9  CREATININE 1.13 0.75  CALCIUM 8.8* 7.8*   Liver Function Tests: Recent Labs  Lab 03/26/17 1710  AST 83*  ALT <5*  ALKPHOS 135*  BILITOT 1.5*  PROT 8.4*  ALBUMIN 4.8   Recent Labs  Lab 03/26/17 1710  LIPASE 14   CBC: Recent Labs  Lab 03/26/17 1710 03/27/17 0404  WBC 16.4* 9.4  NEUTROABS 14.6*  --   HGB 15.6 12.5*  HCT 43.5 35.1*  MCV 80.7 82.8  PLT 460* 290   Cardiac Enzymes: Recent Labs  Lab 03/26/17 1710 03/27/17 0100 03/27/17 0617 03/27/17 1222  TROPONINI <0.03 <0.03 <0.03 <0.03    CBG: Recent Labs  Lab 03/27/17 0731 03/27/17 0820 03/27/17 0901 03/27/17 1140 03/27/17 1205  GLUCAP 73 67 116* 63* 86    Signed:  Barton Dubois MD.  [facility] 03/27/2017, 4:21 PM

## 2017-03-28 ENCOUNTER — Telehealth: Payer: Self-pay | Admitting: Family

## 2017-03-28 NOTE — Telephone Encounter (Signed)
Please contact pt to arrange a hospital follow up with me in the next 2 weeks.

## 2017-03-28 NOTE — Telephone Encounter (Signed)
Called patient to schedule follow up but he said he rather call us back to set up.

## 2017-03-31 MED FILL — FREESTYLE FREEDOM LITE METE: W/DEVICE | 30 days supply | Qty: 1 | Fill #0

## 2017-03-31 MED FILL — ULTICARE PEN NDL 8MM 31G: 31G X 8 MM | 90 days supply | Qty: 100 | Fill #0

## 2017-03-31 MED FILL — FREESTYLE LANCETS: 90 days supply | Qty: 200 | Fill #0

## 2017-03-31 MED FILL — QUETIAPINE 300 MG TABLET: 300 | 90 days supply | Qty: 90 | Fill #1

## 2017-03-31 MED FILL — FREESTYLE LITE TEST STRIP: 50 days supply | Qty: 100 | Fill #0

## 2017-04-01 LAB — GLUCOSE, CAPILLARY: GLUCOSE-CAPILLARY: 188 mg/dL — AB (ref 65–99)

## 2017-04-01 LAB — CULTURE, BLOOD (ROUTINE X 2)
CULTURE: NO GROWTH
Culture: NO GROWTH
SPECIAL REQUESTS: ADEQUATE
Special Requests: ADEQUATE

## 2017-04-28 MED FILL — POTASSIUM CL ER 10 MEQ TAB: 10 | 90 days supply | Qty: 180 | Fill #1

## 2017-04-28 MED FILL — ESCITALOPRAM 20 MG TABLET: 20 | 90 days supply | Qty: 90 | Fill #1

## 2017-04-28 MED FILL — VALACYCLOVIR HCL 500 MG TAB: 500 | 90 days supply | Qty: 90 | Fill #1

## 2017-04-28 MED FILL — ZOLPIDEM TARTRATE 5 MG TABL: 5 | 30 days supply | Qty: 30 | Fill #1

## 2017-05-02 MED FILL — LANTUS SOLOSTAR 100 UNITS/M: 100 | 38 days supply | Qty: 15 | Fill #0

## 2017-05-26 MED FILL — PANTOPRAZOLE SOD DR 40 MG T: 40 | 30 days supply | Qty: 60 | Fill #1

## 2017-05-26 MED FILL — HUMALOG 100 UNITS/ML KWIKPE: 100 | 50 days supply | Qty: 15 | Fill #3

## 2017-05-26 MED FILL — AMLODIPINE BESYLATE 10 MG T: 10 | 90 days supply | Qty: 90 | Fill #1

## 2017-05-26 MED FILL — ZOLPIDEM TARTRATE 5 MG TABL: 5 | 30 days supply | Qty: 30 | Fill #2

## 2017-06-07 ENCOUNTER — Inpatient Hospital Stay (HOSPITAL_BASED_OUTPATIENT_CLINIC_OR_DEPARTMENT_OTHER)
Admission: EM | Admit: 2017-06-07 | Discharge: 2017-06-10 | DRG: 074 | Disposition: A | Discharge status: Home / Self Care | Payer: No Typology Code available for payment source | Attending: Family Medicine | Admitting: Family Medicine

## 2017-06-07 ENCOUNTER — Emergency Department (HOSPITAL_BASED_OUTPATIENT_CLINIC_OR_DEPARTMENT_OTHER): Payer: No Typology Code available for payment source

## 2017-06-07 ENCOUNTER — Encounter (HOSPITAL_BASED_OUTPATIENT_CLINIC_OR_DEPARTMENT_OTHER): Payer: Self-pay | Admitting: Emergency Medicine

## 2017-06-07 ENCOUNTER — Inpatient Hospital Stay (HOSPITAL_COMMUNITY): Payer: No Typology Code available for payment source

## 2017-06-07 ENCOUNTER — Other Ambulatory Visit: Payer: Self-pay

## 2017-06-07 DIAGNOSIS — Z800 Family history of malignant neoplasm of digestive organs: Secondary | ICD-10-CM | POA: Diagnosis not present

## 2017-06-07 DIAGNOSIS — M5416 Radiculopathy, lumbar region: Secondary | ICD-10-CM | POA: Diagnosis present

## 2017-06-07 DIAGNOSIS — K219 Gastro-esophageal reflux disease without esophagitis: Secondary | ICD-10-CM | POA: Diagnosis present

## 2017-06-07 DIAGNOSIS — E1144 Type 2 diabetes mellitus with diabetic amyotrophy: Principal | ICD-10-CM | POA: Diagnosis present

## 2017-06-07 DIAGNOSIS — D1803 Hemangioma of intra-abdominal structures: Secondary | ICD-10-CM | POA: Diagnosis present

## 2017-06-07 DIAGNOSIS — R29898 Other symptoms and signs involving the musculoskeletal system: Secondary | ICD-10-CM | POA: Diagnosis present

## 2017-06-07 DIAGNOSIS — Z833 Family history of diabetes mellitus: Secondary | ICD-10-CM

## 2017-06-07 DIAGNOSIS — G4733 Obstructive sleep apnea (adult) (pediatric): Secondary | ICD-10-CM | POA: Diagnosis present

## 2017-06-07 DIAGNOSIS — Z6829 Body mass index (BMI) 29.0-29.9, adult: Secondary | ICD-10-CM

## 2017-06-07 DIAGNOSIS — F449 Dissociative and conversion disorder, unspecified: Secondary | ICD-10-CM | POA: Diagnosis present

## 2017-06-07 DIAGNOSIS — E7800 Pure hypercholesterolemia, unspecified: Secondary | ICD-10-CM | POA: Diagnosis present

## 2017-06-07 DIAGNOSIS — R160 Hepatomegaly, not elsewhere classified: Secondary | ICD-10-CM | POA: Diagnosis present

## 2017-06-07 DIAGNOSIS — Z801 Family history of malignant neoplasm of trachea, bronchus and lung: Secondary | ICD-10-CM

## 2017-06-07 DIAGNOSIS — D638 Anemia in other chronic diseases classified elsewhere: Secondary | ICD-10-CM | POA: Diagnosis present

## 2017-06-07 DIAGNOSIS — K648 Other hemorrhoids: Secondary | ICD-10-CM | POA: Diagnosis present

## 2017-06-07 DIAGNOSIS — D62 Acute posthemorrhagic anemia: Secondary | ICD-10-CM | POA: Diagnosis present

## 2017-06-07 DIAGNOSIS — Z888 Allergy status to other drugs, medicaments and biological substances status: Secondary | ICD-10-CM | POA: Diagnosis not present

## 2017-06-07 DIAGNOSIS — I10 Essential (primary) hypertension: Secondary | ICD-10-CM | POA: Diagnosis present

## 2017-06-07 DIAGNOSIS — E1165 Type 2 diabetes mellitus with hyperglycemia: Secondary | ICD-10-CM | POA: Diagnosis present

## 2017-06-07 DIAGNOSIS — R29702 NIHSS score 2: Secondary | ICD-10-CM | POA: Diagnosis present

## 2017-06-07 DIAGNOSIS — K861 Other chronic pancreatitis: Secondary | ICD-10-CM

## 2017-06-07 DIAGNOSIS — E785 Hyperlipidemia, unspecified: Secondary | ICD-10-CM | POA: Diagnosis present

## 2017-06-07 DIAGNOSIS — F1010 Alcohol abuse, uncomplicated: Secondary | ICD-10-CM | POA: Diagnosis present

## 2017-06-07 DIAGNOSIS — D500 Iron deficiency anemia secondary to blood loss (chronic): Secondary | ICD-10-CM

## 2017-06-07 DIAGNOSIS — K760 Fatty (change of) liver, not elsewhere classified: Secondary | ICD-10-CM | POA: Diagnosis present

## 2017-06-07 DIAGNOSIS — F418 Other specified anxiety disorders: Secondary | ICD-10-CM | POA: Diagnosis present

## 2017-06-07 DIAGNOSIS — K624 Stenosis of anus and rectum: Secondary | ICD-10-CM | POA: Diagnosis present

## 2017-06-07 DIAGNOSIS — R079 Chest pain, unspecified: Secondary | ICD-10-CM | POA: Diagnosis present

## 2017-06-07 DIAGNOSIS — R52 Pain, unspecified: Secondary | ICD-10-CM

## 2017-06-07 DIAGNOSIS — IMO0002 Reserved for concepts with insufficient information to code with codable children: Secondary | ICD-10-CM | POA: Diagnosis present

## 2017-06-07 DIAGNOSIS — E663 Overweight: Secondary | ICD-10-CM | POA: Diagnosis present

## 2017-06-07 DIAGNOSIS — Z823 Family history of stroke: Secondary | ICD-10-CM

## 2017-06-07 DIAGNOSIS — I639 Cerebral infarction, unspecified: Secondary | ICD-10-CM | POA: Diagnosis present

## 2017-06-07 DIAGNOSIS — F17210 Nicotine dependence, cigarettes, uncomplicated: Secondary | ICD-10-CM | POA: Diagnosis present

## 2017-06-07 LAB — DIFFERENTIAL
BASOS PCT: 0 %
Basophils Absolute: 0 10*3/uL (ref 0.0–0.1)
EOS ABS: 0.1 10*3/uL (ref 0.0–0.7)
Eosinophils Relative: 1 %
Lymphocytes Relative: 25 %
Lymphs Abs: 2.2 10*3/uL (ref 0.7–4.0)
MONO ABS: 0.7 10*3/uL (ref 0.1–1.0)
Monocytes Relative: 8 %
NEUTROS PCT: 66 %
Neutro Abs: 5.9 10*3/uL (ref 1.7–7.7)

## 2017-06-07 LAB — COMPREHENSIVE METABOLIC PANEL
ALBUMIN: 3.7 g/dL (ref 3.5–5.0)
ALT: 53 U/L (ref 17–63)
AST: 66 U/L — ABNORMAL HIGH (ref 15–41)
Alkaline Phosphatase: 107 U/L (ref 38–126)
Anion gap: 13 (ref 5–15)
BUN: 16 mg/dL (ref 6–20)
CO2: 24 mmol/L (ref 22–32)
Calcium: 8.7 mg/dL — ABNORMAL LOW (ref 8.9–10.3)
Chloride: 100 mmol/L — ABNORMAL LOW (ref 101–111)
Creatinine, Ser: 1.33 mg/dL — ABNORMAL HIGH (ref 0.61–1.24)
GFR calc non Af Amer: >60 mL/min (ref >60)
GLUCOSE: 88 mg/dL (ref 65–99)
POTASSIUM: 3.9 mmol/L (ref 3.5–5.1)
SODIUM: 137 mmol/L (ref 135–145)
Total Bilirubin: 0.9 mg/dL (ref 0.3–1.2)
Total Protein: 7.8 g/dL (ref 6.5–8.1)

## 2017-06-07 LAB — PROTIME-INR
INR: 1.01
PROTHROMBIN TIME: 13.2 s (ref 11.4–15.2)

## 2017-06-07 LAB — TROPONIN I
Troponin I: <0.03 ng/mL (ref <0.03)
Troponin I: <0.03 ng/mL (ref <0.03)

## 2017-06-07 LAB — CBC
HCT: 42.6 % (ref 39.0–52.0)
Hemoglobin: 15.2 g/dL (ref 13.0–17.0)
MCH: 30.1 pg (ref 26.0–34.0)
MCHC: 35.7 g/dL (ref 30.0–36.0)
MCV: 84.4 fL (ref 78.0–100.0)
PLATELETS: 355 10*3/uL (ref 150–400)
RBC: 5.05 MIL/uL (ref 4.22–5.81)
RDW: 14.6 % (ref 11.5–15.5)
WBC: 8.9 10*3/uL (ref 4.0–10.5)

## 2017-06-07 LAB — APTT: aPTT: 29 seconds (ref 24–36)

## 2017-06-07 LAB — CBG MONITORING, ED
GLUCOSE-CAPILLARY: 70 mg/dL (ref 65–99)
Glucose-Capillary: 170 mg/dL — ABNORMAL HIGH (ref 65–99)

## 2017-06-07 LAB — GLUCOSE, CAPILLARY: Glucose-Capillary: 100 mg/dL — ABNORMAL HIGH (ref 65–99)

## 2017-06-07 MED ORDER — QUETIAPINE FUMARATE 50 MG PO TABS
300.0000 mg | ORAL_TABLET | Freq: Every day | ORAL | Status: DC
  Administered 2017-06-07 – 2017-06-09 (×3): 300 mg via ORAL
  Filled 2017-06-07 (×3): qty 6

## 2017-06-07 MED ORDER — PANTOPRAZOLE SODIUM 40 MG IV SOLR
INTRAVENOUS | Status: AC
  Filled 2017-06-07: qty 80

## 2017-06-07 MED ORDER — ACETAMINOPHEN 160 MG/5ML PO SOLN: 650.0000 mg | ORAL | Status: DC | PRN

## 2017-06-07 MED ORDER — SODIUM CHLORIDE 0.9 % IV SOLN
80.0000 mg | Freq: Once | INTRAVENOUS | Status: AC
  Administered 2017-06-07: 80 mg via INTRAVENOUS
  Filled 2017-06-07: qty 80

## 2017-06-07 MED ORDER — INSULIN ASPART 100 UNIT/ML ~~LOC~~ SOLN
0.0000 [IU] | Freq: Four times a day (QID) | SUBCUTANEOUS | Status: DC
  Administered 2017-06-08: 3 [IU] via SUBCUTANEOUS
  Administered 2017-06-08: 15 [IU] via SUBCUTANEOUS
  Administered 2017-06-09: 5 [IU] via SUBCUTANEOUS
  Administered 2017-06-09: 11 [IU] via SUBCUTANEOUS

## 2017-06-07 MED ORDER — ONDANSETRON 4 MG PO TBDP: 8.0000 mg | ORAL_TABLET | Freq: Three times a day (TID) | ORAL | Status: DC | PRN

## 2017-06-07 MED ORDER — ESCITALOPRAM OXALATE 10 MG PO TABS
20.0000 mg | ORAL_TABLET | Freq: Every day | ORAL | Status: DC
  Administered 2017-06-07 – 2017-06-10 (×4): 20 mg via ORAL
  Filled 2017-06-07 (×4): qty 2

## 2017-06-07 MED ORDER — ACETAMINOPHEN 650 MG RE SUPP: 650.0000 mg | RECTAL | Status: DC | PRN

## 2017-06-07 MED ORDER — CLONIDINE HCL 0.1 MG PO TABS
0.1000 mg | ORAL_TABLET | Freq: Three times a day (TID) | ORAL | Status: DC
  Administered 2017-06-07 – 2017-06-10 (×8): 0.1 mg via ORAL
  Filled 2017-06-07 (×8): qty 1

## 2017-06-07 MED ORDER — ADULT MULTIVITAMIN W/MINERALS CH
1.0000 | ORAL_TABLET | Freq: Every day | ORAL | Status: DC
Start: 2017-06-07 — End: 2017-06-10
  Administered 2017-06-07 – 2017-06-10 (×4): 1 via ORAL
  Filled 2017-06-07 (×4): qty 1

## 2017-06-07 MED ORDER — LISINOPRIL 20 MG PO TABS
20.0000 mg | ORAL_TABLET | Freq: Every day | ORAL | Status: DC
  Administered 2017-06-08 – 2017-06-10 (×3): 20 mg via ORAL
  Filled 2017-06-07 (×3): qty 1

## 2017-06-07 MED ORDER — LORAZEPAM 2 MG/ML IJ SOLN
0.5000 mg | Freq: Once | INTRAMUSCULAR | Status: AC
  Administered 2017-06-07: 0.5 mg via INTRAVENOUS
  Filled 2017-06-07: qty 1

## 2017-06-07 MED ORDER — SENNOSIDES-DOCUSATE SODIUM 8.6-50 MG PO TABS: 1.0000 | ORAL_TABLET | Freq: Every evening | ORAL | Status: DC | PRN

## 2017-06-07 MED ORDER — ATORVASTATIN CALCIUM 10 MG PO TABS
20.0000 mg | ORAL_TABLET | Freq: Every day | ORAL | Status: DC
  Administered 2017-06-07 – 2017-06-10 (×4): 20 mg via ORAL
  Filled 2017-06-07 (×4): qty 2

## 2017-06-07 MED ORDER — SODIUM CHLORIDE 0.9 % IV SOLN
INTRAVENOUS | Status: AC
  Administered 2017-06-07: 21:00:00 via INTRAVENOUS

## 2017-06-07 MED ORDER — LORAZEPAM 1 MG PO TABS
1.0000 mg | ORAL_TABLET | Freq: Four times a day (QID) | ORAL | Status: DC | PRN
  Administered 2017-06-08 – 2017-06-10 (×4): 1 mg via ORAL
  Filled 2017-06-07 (×4): qty 1

## 2017-06-07 MED ORDER — LORAZEPAM 2 MG/ML IJ SOLN
1.0000 mg | Freq: Four times a day (QID) | INTRAMUSCULAR | Status: DC | PRN
  Administered 2017-06-07 – 2017-06-09 (×6): 1 mg via INTRAVENOUS
  Filled 2017-06-07 (×6): qty 1

## 2017-06-07 MED ORDER — ACETAMINOPHEN 325 MG PO TABS
650.0000 mg | ORAL_TABLET | ORAL | Status: DC | PRN
  Administered 2017-06-08 – 2017-06-10 (×3): 650 mg via ORAL
  Filled 2017-06-07 (×3): qty 2

## 2017-06-07 MED ORDER — ASPIRIN EC 325 MG PO TBEC
325.0000 mg | DELAYED_RELEASE_TABLET | Freq: Once | ORAL | Status: AC
  Administered 2017-06-07: 325 mg via ORAL
  Filled 2017-06-07: qty 1

## 2017-06-07 MED ORDER — FOLIC ACID 1 MG PO TABS
1.0000 mg | ORAL_TABLET | Freq: Every day | ORAL | Status: DC
  Administered 2017-06-08 – 2017-06-10 (×3): 1 mg via ORAL
  Filled 2017-06-07 (×4): qty 1

## 2017-06-07 MED ORDER — THIAMINE HCL 100 MG/ML IJ SOLN
100.0000 mg | Freq: Every day | INTRAMUSCULAR | Status: DC
  Administered 2017-06-07: 100 mg via INTRAVENOUS
  Filled 2017-06-07 (×4): qty 2

## 2017-06-07 MED ORDER — IOPAMIDOL (ISOVUE-370) INJECTION 76%
125.0000 mL | Freq: Once | INTRAVENOUS | Status: AC | PRN
  Administered 2017-06-07: 125 mL via INTRAVENOUS

## 2017-06-07 MED ORDER — AMLODIPINE BESYLATE 10 MG PO TABS
10.0000 mg | ORAL_TABLET | Freq: Every day | ORAL | Status: DC
  Administered 2017-06-07 – 2017-06-10 (×4): 10 mg via ORAL
  Filled 2017-06-07 (×4): qty 1

## 2017-06-07 MED ORDER — VITAMIN B-1 100 MG PO TABS
100.0000 mg | ORAL_TABLET | Freq: Every day | ORAL | Status: DC
  Administered 2017-06-08 – 2017-06-10 (×3): 100 mg via ORAL
  Filled 2017-06-07 (×3): qty 1

## 2017-06-07 MED ORDER — PANCRELIPASE (LIP-PROT-AMYL) 12000-38000 UNITS PO CPEP
36000.0000 [IU] | ORAL_CAPSULE | Freq: Three times a day (TID) | ORAL | Status: DC
  Administered 2017-06-08 – 2017-06-10 (×8): 36000 [IU] via ORAL
  Filled 2017-06-07 (×8): qty 3

## 2017-06-07 MED ORDER — STROKE: EARLY STAGES OF RECOVERY BOOK
Freq: Once | Status: AC
  Administered 2017-06-07: 20:00:00

## 2017-06-07 MED ORDER — LORAZEPAM 2 MG/ML IJ SOLN
0.5000 mg | Freq: Once | INTRAMUSCULAR | Status: DC
  Filled 2017-06-07: qty 1

## 2017-06-07 MED ORDER — INSULIN GLARGINE 100 UNIT/ML ~~LOC~~ SOLN
20.0000 [IU] | Freq: Every day | SUBCUTANEOUS | Status: DC
  Administered 2017-06-08: 20 [IU] via SUBCUTANEOUS
  Filled 2017-06-07 (×3): qty 0.2

## 2017-06-07 MED ORDER — PANTOPRAZOLE SODIUM 40 MG IV SOLR
40.0000 mg | INTRAVENOUS | Status: DC
Start: 2017-06-08 — End: 2017-06-08
  Administered 2017-06-08: 40 mg via INTRAVENOUS
  Filled 2017-06-07: qty 40

## 2017-06-07 NOTE — ED Notes (Signed)
[facility] is here to pick up patient.  Prior to transfer, pt c/o chest pain, central area.  EKG done.

## 2017-06-07 NOTE — ED Provider Notes (Addendum)
[facility] EMERGENCY DEPARTMENT Provider Note   CSN: [id number] Arrival date & time: 06/07/17  1031   Level 5 caveat acuity of situation  History   Chief Complaint Chief Complaint  Patient presents with  . Numbness  . Rectal Bleeding    HPI Willie [name] is a 42 y.o. male.  Complains of left arm weakness and numbness onset upon awakening 6 AM today.  He felt well when he went to bed at 7 PM yesterday.  He denies pain anywhere.  Other symptoms include slight amount of painless rectal bleeding for the past 3 days which she has had in the past, which has been attributed to hemorrhoids in the past.  Nothing makes symptoms better or worse.  No other associated symptoms  HPI  Past Medical History:  Diagnosis Date  . Alcoholic pancreatitis    recurrent  . Depression   . Diabetes mellitus, type II ([place])    New Onset 03/2010  . GERD (gastroesophageal reflux disease)   . High cholesterol   . 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 Hypertension   . Sleep apnea    does not wear a CPAP    Patient Active Problem List   Diagnosis Date Noted  . Vomiting alone 03/27/2017  . Chest pain 03/27/2017  . Nausea and vomiting   . Lactic acidosis 03/26/2017  . History of alcohol abuse 06/25/2016  . Tobacco abuse 06/25/2016  . Anxiety state 11/20/2015  . Dental infection 07/10/2015  . Mood disorder ([place]) 04/18/2015  . Eczema 06/27/2014  . GERD (gastroesophageal reflux disease) 05/24/2014  . Snoring 04/07/2013  . Suicide attempt by substance overdose ([place]) 02/10/2013  . Alcohol abuse 12/31/2012  . Erectile dysfunction 12/09/2012  . Hematuria 06/11/2012  . Vision problem 02/02/2012  . Insomnia 12/20/2011  . Depression with anxiety 11/13/2010  . Hyperlipemia 06/27/2010  . Diabetes type 2, uncontrolled ([place]) 05/22/2010  . Essential hypertension 05/22/2010    Past Surgical History:  Procedure Laterality Date  . LUMBAR  MICRODISCECTOMY  ~ 2004   Dr Ellene Route       Home Medications    Prior to Admission medications   Medication Sig Start Date End Date Taking? Authorizing Provider  amLODipine (NORVASC) 10 MG tablet Take 1 tablet (10 mg total) by mouth daily. 02/27/17   Debbrah Alar, NP  atorvastatin (LIPITOR) 20 MG tablet Take 1 tablet (20 mg total) by mouth daily. 02/27/17   Debbrah Alar, NP  Blood Glucose Monitoring Suppl (FREESTYLE FREEDOM LITE) w/Device KIT 2 (two) times daily 02/27/17   Debbrah Alar, NP  cloNIDine (CATAPRES) 0.1 MG tablet Take 1 tablet (0.1 mg total) by mouth 3 (three) times daily. 02/27/17   Debbrah Alar, NP  escitalopram (LEXAPRO) 20 MG tablet Take 1 tablet (20 mg total) by mouth daily. 02/27/17   Debbrah Alar, NP  glucose blood (FREESTYLE LITE) test strip Use as instructed 02/27/17   Debbrah Alar, NP  hydrOXYzine (ATARAX/VISTARIL) 50 MG tablet Take 1 tablet (50 mg total) by mouth 3 (three) times daily as needed. 02/27/17   Debbrah Alar, NP  Insulin Glargine (LANTUS SOLOSTAR) 100 UNIT/ML Solostar Pen Inject 40 Units into the skin daily at 10 pm. 02/27/17   Debbrah Alar, NP  insulin lispro (HUMALOG KWIKPEN) 100 UNIT/ML KiwkPen 3 times a day (just before each meal) 09-10-08 units, Patient taking differently: Inject 15 Units into the skin daily.  02/27/17   Debbrah Alar,  NP  Insulin Pen Needle (ULTICARE SHORT PEN NEEDLES) 31G X 8 MM MISC Use once daily to inject insulin. 02/27/17   Debbrah Alar, NP  Lancets (FREESTYLE) lancets 2 (two) times daily 02/27/17   Debbrah Alar, NP  lisinopril (PRINIVIL,ZESTRIL) 20 MG tablet TAKE 1 TABLET (20 MG TOTAL) BY MOUTH DAILY. 02/27/17   Debbrah Alar, NP  methocarbamol (ROBAXIN) 500 MG tablet Take 1 tablet (500 mg total) by mouth every 8 (eight) hours as needed for muscle spasms. 03/27/17   Barton Dubois, MD  Multiple Vitamins-Minerals (MULTIVITAMIN) tablet Take 1 tablet by mouth daily.  02/12/16   Palumbo, April, MD  ondansetron (ZOFRAN ODT) 8 MG disintegrating tablet Take 1 tablet (8 mg total) by mouth every 8 (eight) hours as needed for nausea or vomiting. 03/27/17   Barton Dubois, MD  pantoprazole (PROTONIX) 40 MG tablet Take 1 tablet (40 mg total) by mouth 2 (two) times daily. 03/27/17   Barton Dubois, MD  potassium chloride (K-DUR,KLOR-CON) 10 MEQ tablet Take 2 tablets (20 mEq total) by mouth daily. 02/27/17   Debbrah Alar, NP  QUEtiapine (SEROQUEL) 300 MG tablet Take 1 tablet (300 mg total) by mouth at bedtime. 02/27/17   Debbrah Alar, NP  sucralfate (CARAFATE) 1 GM/10ML suspension Take 10 mLs (1 g total) by mouth 3 (three) times daily. 03/27/17 03/27/18  Barton Dubois, MD  tadalafil (CIALIS) 10 MG tablet Take 1 tablet (10 mg total) by mouth daily as needed for erectile dysfunction. 06/25/16   Debbrah Alar, NP  thiamine 100 MG tablet Take 1 tablet (100 mg total) by mouth daily. 02/27/17   Debbrah Alar, NP  triamcinolone cream (KENALOG) 0.1 % Apply 1 application topically 2 (two) times daily. 02/27/17   Debbrah Alar, NP  valACYclovir (VALTREX) 500 MG tablet Take 1 tablet (500 mg total) by mouth daily. 02/27/17   Debbrah Alar, NP  zolpidem (AMBIEN) 5 MG tablet Take 1 tablet (5 mg total) by mouth at bedtime as needed. for sleep 02/27/17   Debbrah Alar, NP    Family History Family History  Problem Relation Age of Onset  . Diabetes Mother   . Lung cancer Brother        twin brother  . Pancreatic cancer Paternal Aunt   . Colon cancer Neg Hx   . Stomach cancer Neg Hx     Social History Social History   Tobacco Use  . Smoking status: Current Every Day Smoker    Packs/day: 0.25    Years: 1.00    Pack years: 0.25    Types: Cigarettes    Start date: 04/29/2016  . Smokeless tobacco: Never Used  . Tobacco comment: 4 Cigarettes a day   Substance Use Topics  . Alcohol use: Yes  . Drug use: No    Comment: none in years      Allergies   Invokamet [canagliflozin-metformin hcl] and Metformin and related   Review of Systems Review of Systems  Gastrointestinal: Positive for blood in stool.  Allergic/Immunologic: Positive for immunocompromised state.       Diabetic  Neurological: Positive for weakness and numbness.       Left arm weakness.  Left arm numbness     Physical Exam Updated Vital Signs BP (!) 156/109 (BP Location: Right Arm)   Pulse (!) 133   Temp 98.1 F (36.7 C) (Oral)   Resp (!) 24   SpO2 100%   Physical Exam  Constitutional: He is oriented to person, place, and time. He appears well-developed and well-nourished.  Appears very anxious  HENT:  Head: Normocephalic and atraumatic.  Eyes: Conjunctivae are normal. Pupils are equal, round, and reactive to light.  Neck: Neck supple. No tracheal deviation present. No thyromegaly present.  Cardiovascular: Regular rhythm.  Tachycardic  Pulmonary/Chest: Effort normal and breath sounds normal.  Abdominal: Soft. Bowel sounds are normal. He exhibits no distension. There is no tenderness.  Genitourinary:  Genitourinary Comments: Rectum normal tone gross red blood present.  Nontender  Musculoskeletal: Normal range of motion. He exhibits no edema or tenderness.  Moves all extremities motor strength left upper extremity 4/5 all other extremities 5/5 DTR symmetric bilaterally at knee jerk ankle jerk and biceps toes downward going bilaterally  Neurological: He is alert and oriented to person, place, and time. Coordination normal.  Skin: Skin is warm and dry. Capillary refill takes less than 2 seconds. No rash noted.  Psychiatric:  Anxious  Nursing note and vitals reviewed.    ED Treatments / Results  Labs (all labs ordered are listed, but only abnormal results are displayed) Labs Reviewed  PROTIME-INR  APTT  CBC  DIFFERENTIAL  COMPREHENSIVE METABOLIC PANEL  TROPONIN I  CBG MONITORING, ED   Results for orders placed or performed  during the hospital encounter of 06/07/17  Protime-INR  Result Value Ref Range   Prothrombin Time 13.2 11.4 - 15.2 seconds   INR 1.01   APTT  Result Value Ref Range   aPTT 29 24 - 36 seconds  CBC  Result Value Ref Range   WBC 8.9 4.0 - 10.5 K/uL   RBC 5.05 4.22 - 5.81 MIL/uL   Hemoglobin 15.2 13.0 - 17.0 g/dL   HCT 42.6 39.0 - 52.0 %   MCV 84.4 78.0 - 100.0 fL   MCH 30.1 26.0 - 34.0 pg   MCHC 35.7 30.0 - 36.0 g/dL   RDW 14.6 11.5 - 15.5 %   Platelets 355 150 - 400 K/uL  Differential  Result Value Ref Range   Neutrophils Relative % 66 %   Neutro Abs 5.9 1.7 - 7.7 K/uL   Lymphocytes Relative 25 %   Lymphs Abs 2.2 0.7 - 4.0 K/uL   Monocytes Relative 8 %   Monocytes Absolute 0.7 0.1 - 1.0 K/uL   Eosinophils Relative 1 %   Eosinophils Absolute 0.1 0.0 - 0.7 K/uL   Basophils Relative 0 %   Basophils Absolute 0.0 0.0 - 0.1 K/uL  Comprehensive metabolic panel  Result Value Ref Range   Sodium 137 135 - 145 mmol/L   Potassium 3.9 3.5 - 5.1 mmol/L   Chloride 100 (L) 101 - 111 mmol/L   CO2 24 22 - 32 mmol/L   Glucose, Bld 88 65 - 99 mg/dL   BUN 16 6 - 20 mg/dL   Creatinine, Ser 1.33 (H) 0.61 - 1.24 mg/dL   Calcium 8.7 (L) 8.9 - 10.3 mg/dL   Total Protein 7.8 6.5 - 8.1 g/dL   Albumin 3.7 3.5 - 5.0 g/dL   AST 66 (H) 15 - 41 U/L   ALT 53 17 - 63 U/L   Alkaline Phosphatase 107 38 - 126 U/L   Total Bilirubin 0.9 0.3 - 1.2 mg/dL   GFR calc non Af Amer >60 >60 mL/min   GFR calc Af Amer >60 >60 mL/min   Anion gap 13 5 - 15  Troponin I  Result Value Ref Range   Troponin I <0.03 <0.03 ng/mL  CBG monitoring, ED  Result Value Ref Range   Glucose-Capillary 70 65 - 99  mg/dL   Ct Angio Head W Or Wo Contrast  Result Date: 06/07/2017 CLINICAL DATA:  Patient woke with left arm weakness and numbness. EXAM: CT ANGIOGRAPHY HEAD AND NECK TECHNIQUE: Multidetector CT imaging of the head and neck was performed using the standard protocol during bolus administration of intravenous contrast.  Multiplanar CT image reconstructions and MIPs were obtained to evaluate the vascular anatomy. Carotid stenosis measurements (when applicable) are obtained utilizing NASCET criteria, using the distal internal carotid diameter as the denominator. CONTRAST:  11m ISOVUE-370 IOPAMIDOL (ISOVUE-370) INJECTION 76% COMPARISON:  CTA of the neck 07/21/2014 FINDINGS: CTA NECK FINDINGS Aortic arch: Normal appearance.  Four vessel branching. Right carotid system: Mild tortuosity with partial retropharyngeal course. No atheromatous changes, beading, or stenosis. Left carotid system: Mild tortuosity with partial retropharyngeal course. No beading, stenosis, or atheromatous change. Vertebral arteries: No proximal subclavian stenosis. The left vertebral artery arises from the arch. Both vertebral arteries are smooth and diffusely patent. No atheromatous changes. Skeleton: No acute or aggressive finding. Other neck: No incidental mass or inflammation. Upper chest: Negative Review of the MIP images confirms the above findings CTA HEAD FINDINGS Diminished detail due to contrast timing and arterial density. Anterior circulation: No atheromatous changes, stenosis, occlusion, or aneurysm. Posterior circulation: Codominant vertebral arteries. Bilateral picas patent. Vertebral and basilar arteries are smooth and widely patent. Symmetric robust PCA flow. Venous sinuses: Patent Anatomic variants: None significant Delayed phase: No abnormal intracranial enhancement. Review of the MIP images confirms the above findings IMPRESSION: Negative CTA of the head and neck. Electronically Signed   By: JMonte FantasiaM.D.   On: 06/07/2017 11:31   Ct Angio Neck W And/or Wo Contrast  Result Date: 06/07/2017 CLINICAL DATA:  Patient woke with left arm weakness and numbness. EXAM: CT ANGIOGRAPHY HEAD AND NECK TECHNIQUE: Multidetector CT imaging of the head and neck was performed using the standard protocol during bolus administration of intravenous  contrast. Multiplanar CT image reconstructions and MIPs were obtained to evaluate the vascular anatomy. Carotid stenosis measurements (when applicable) are obtained utilizing NASCET criteria, using the distal internal carotid diameter as the denominator. CONTRAST:  1248mISOVUE-370 IOPAMIDOL (ISOVUE-370) INJECTION 76% COMPARISON:  CTA of the neck 07/21/2014 FINDINGS: CTA NECK FINDINGS Aortic arch: Normal appearance.  Four vessel branching. Right carotid system: Mild tortuosity with partial retropharyngeal course. No atheromatous changes, beading, or stenosis. Left carotid system: Mild tortuosity with partial retropharyngeal course. No beading, stenosis, or atheromatous change. Vertebral arteries: No proximal subclavian stenosis. The left vertebral artery arises from the arch. Both vertebral arteries are smooth and diffusely patent. No atheromatous changes. Skeleton: No acute or aggressive finding. Other neck: No incidental mass or inflammation. Upper chest: Negative Review of the MIP images confirms the above findings CTA HEAD FINDINGS Diminished detail due to contrast timing and arterial density. Anterior circulation: No atheromatous changes, stenosis, occlusion, or aneurysm. Posterior circulation: Codominant vertebral arteries. Bilateral picas patent. Vertebral and basilar arteries are smooth and widely patent. Symmetric robust PCA flow. Venous sinuses: Patent Anatomic variants: None significant Delayed phase: No abnormal intracranial enhancement. Review of the MIP images confirms the above findings IMPRESSION: Negative CTA of the head and neck. Electronically Signed   By: JoMonte Fantasia.D.   On: 06/07/2017 11:31   Ct Head Code Stroke Wo Contrast  Result Date: 06/07/2017 CLINICAL DATA:  Code stroke. Left arm numbness and weakness since awakening. EXAM: CT HEAD WITHOUT CONTRAST TECHNIQUE: Contiguous axial images were obtained from the base of the skull through the vertex without  intravenous contrast.  COMPARISON:  12/24/2014 FINDINGS: Brain: No evidence of acute infarction, hemorrhage, hydrocephalus, extra-axial collection or mass lesion/mass effect. Vascular: No hyperdense vessel or unexpected calcification. Skull: Normal. Negative for fracture or focal lesion. Sinuses/Orbits: No acute finding. Other: These results were called by telephone at the time of interpretation on 06/07/2017 at 11:01 am to Dr. Orlie Dakin , who verbally acknowledged these results. ASPECTS [facility] Stroke Program Early CT Score) - Ganglionic level infarction (caudate, lentiform nuclei, internal capsule, insula, M1-M3 cortex): 7 - Supraganglionic infarction (M4-M6 cortex): 3 Total score (0-10 with 10 being normal): 10 IMPRESSION: Negative head CT.  ASPECTS is 10. Electronically Signed   By: Monte Fantasia M.D.   On: 06/07/2017 11:02  EKG  EKG Interpretation  Date/Time:  Saturday June 07 2017 10:39:54 EST Ventricular Rate:  136 PR Interval:    QRS Duration: 82 QT Interval:  293 QTC Calculation: 441 R Axis:   38 Text Interpretation:  Sinus tachycardia Ventricular premature complex Prolonged PR interval Minimal ST elevation, inferior leads Baseline wander in lead(s) III V2 V5 SINCE LAST TRACING HEART RATE HAS INCREASED Confirmed by Orlie Dakin [phone]) on 06/07/2017 10:52:42 AM       Radiology No results found.  Procedures Procedures (including critical care time)  Medications Ordered in ED Medications - No data to display   Initial Impression / Assessment and Plan / ED Course  I have reviewed the triage vital signs and the nursing notes.  Pertinent labs & imaging results that were available during my care of the patient were reviewed by me and considered in my medical decision making (see chart for details).     11:50 AM patient appears extremely anxious.  IV Ativan ordered.  A 12:15 PM he is resting more comfortably and appears less anxious.  Still with some weakness in left arm.  Dr.Lue,  tele-neurology service was consulted and interviewed patient.  He suggests hospitalization for stroke workup.  I have consulted Dr.Purohit hospitalist service who will arrange for admission and transfer to [facility]. Aspirin administered as patient is hemodynamically stable and benefit of aspirin outweighs bleeding risk from aspirin.  NIH stroke scale calculated at 2 by nurse.  Patient passed stroke swallow screen Final Clinical Impressions(s) / ED Diagnoses  Diagnosis #1 acute left arm weakness #2 rectal bleeding #3 anxiety Final diagnoses:  None  #4 renal insufficiency  ED Discharge Orders    None    CRITICAL CARE Performed by: Orlie Dakin Total critical care time: 30 minutes Critical care time was exclusive of separately billable procedures and treating other patients. Critical care was necessary to treat or prevent imminent or life-threatening deterioration. Critical care was time spent personally by me on the following activities: development of treatment plan with patient and/or surrogate as well as nursing, discussions with consultants, evaluation of patient's response to treatment, examination of patient, obtaining history from patient or surrogate, ordering and performing treatments and interventions, ordering and review of laboratory studies, ordering and review of radiographic studies, pulse oximetry and re-evaluation of patient's condition.   Orlie Dakin, MD 06/07/17 1230 2:30 PM patient complains of epigastric pain after he ate a meal.  IV Protonix ordered   Orlie Dakin, MD 06/07/17 1436

## 2017-06-07 NOTE — ED Notes (Signed)
ED Provider at bedside. 

## 2017-06-07 NOTE — H&P (Signed)
History and Physical   Willie [name] WCB:[id number] DOB: [birth year] DOA: 06/07/2017  PCP: Debbrah Alar, NP  Chief Complaint: left arm weakness  HPI:  This is a 42 year old man with prior history of alcohol abuse, chronic pancreatitis on imaging CT scan 2018, current smoker, insulin-dependent diabetes, who presented with left upper extremity weakness.  He reports onset of symptoms at 9 AM on day of dictation, who woke and realized that when attempting to use his left upper extremity from the shoulder to his fingers he noticed the hand incoordination and weakness only when sitting or standing, he did not have this problem laying down per his report. Associated symptoms include chest pain onset of 10 and, described as stabbing, right 6 out of 10, has been constant, has never had before. He reports that his father died at age 35 of myocardial infarction, reports brother had cardiovascular disease as well.  He works at [employer], lives by himself, reports taking online college classes. He reports drinking alcohol for the past week, but is purposefully vague on the amount. He denies any fevers, chills, shortness of breath.  ED Course:  At [facility] [place], coarse remarkable for tachycardia with heart rate in the 130s, increase respiratory rate 24, mildly hypertensive with systolic 616W. Lab evaluation remarkable for creatinine of 1.33, troponin undetectable.  Neurology team was consulted who did not recommend thrombolytics, believed the patient had left upper extremity ataxia. CTA of the head and neck did not reveal any acute findings. Patient was transferred to [facility] for further management.  The patient had rectal exam performed which was noted to have some blood in rectal vault.  Review of Systems: A complete ROS was obtained; pertinent positives negatives are denoted in the HPI. Otherwise, all systems are negative.   Past Medical History:  Diagnosis Date  . Alcoholic  pancreatitis    recurrent  . Depression   . Diabetes mellitus, type II ([place])    New Onset 03/2010  . GERD (gastroesophageal reflux disease)   . High cholesterol   . 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 Hypertension   . Sleep apnea    does not wear a CPAP   Social History   Socioeconomic History  . Marital status: Married    Spouse name: Not on file  . Number of children: 2  . Years of education: Not on file  . Highest education level: Not on file  Social Needs  . Financial resource strain: Not on file  . Food insecurity - worry: Not on file  . Food insecurity - inability: Not on file  . Transportation needs - medical: Not on file  . Transportation needs - non-medical: Not on file  Occupational History  . Occupation: Landscape architect: [employer]  Tobacco Use  . Smoking status: Current Every Day Smoker    Packs/day: 0.25    Years: 1.00    Pack years: 0.25    Types: Cigarettes    Start date: 04/29/2016  . Smokeless tobacco: Never Used  . Tobacco comment: 4 Cigarettes a day   Substance and Sexual Activity  . Alcohol use: Yes  . Drug use: No    Comment: none in years  . Sexual activity: Not on file  Other Topics Concern  . 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 Diabetes Mother   . Lung cancer Brother        twin brother  . Pancreatic cancer Paternal Aunt   . Colon cancer Neg Hx   . Stomach cancer Neg Hx     Physical Exam: Vitals:   06/07/17 1636 06/07/17 1637 06/07/17 1700 06/07/17 1827  BP: 114/82 114/82 (!) 132/91 129/90  Pulse: (!) 115 (!) 118 (!) 116 (!) 109  Resp: 17 14 13 16   Temp:    99.1 F (37.3 C)  TempSrc:    Oral  SpO2: 100% 100%  100% 100%   General: Overweight white man, anxious appearing ENT: Grossly normal hearing, MMM. Cardiovascular: Tachycardic to 120. No M/R/G. No LE edema.  Respiratory: CTA bilaterally. No wheezes or crackles. Normal respiratory effort.  Breathing RA. Abdomen: Soft, mildly tender to epigastrium, no rebound or guarding. Skin: No rash or induration seen on limited exam. Musculoskeletal: Grossly normal tone BUE/BLE. Appropriate ROM.  Psychiatric: Anxious. Neurologic: EOMI, pupils equally round and reactive to light bilaterally, strength preserved in proximal extremities x 4, gross sensation intact, heel to shin WNL bilaterally, finger to nose with marked tremulousness, more pronounced in LUE than right. Oriented to year. GI: external visualization in peri-anal area without frank bleeding, deferred rectal exam as it was already performed by ED staff  I have personally reviewed the following labs, culture data, and imaging studies.  Assessment/Plan:  #Left upper extremity ataxia Differential includes CVA, tremulousness related to ETOH withdrawal, versus other etiology.  Neurology evaluated the patient via tele-consult - deemed not a candidate for thrombolytics. Plan: -will procure TTE, monitor on telemetry, procure MRI to rule out CVA as etiology -I am concerned that ETOH withdrawal and /or anxiety may be playing a role, will continue CIWA protocol due to concerns for ETOH withdrawal -risk stratification labs with lipid panel + A1c  #Other problems -Chest pain: troponin undetectable, CXR on admission without acute findings, will repeat troponin and obtain EKG; other possible etiologies include GERD, anxiety, vs other -Insulin dependent DM: A1c of 10.7% in 02/2017: will reduce basal insulin from 35 to 20 units while inpatient, rapid acting insulin as needed for correction q 6 hrs while NPO -Smoking: continued smoking cessation -Anxiety: continue home seroquel, SSRI -HTN: continue home  clonidine, CCB, ACEi -Chronic pancreatitis: start pancreatic enzymes when diet advaced -GI bleeding: reportedly with hx of hemorrhoids, hold pharmacologic DVT ppx, SCDs for now, IV PPI for now  DVT prophylaxis: SCDs Code Status: Full code Disposition Plan: Anticipate D/C home in 2-5 days Consults called: recommend consulting neurology in AM to evaluate the patient, they evaluated the patient remotely when at [facility] center Admission status: admit to hospital medicine team   Cheri Rous, MD [facility] Page:[phone]  If 7PM-7AM, please contact night-coverage [email or web address] Password [email or web address]

## 2017-06-07 NOTE — ED Notes (Addendum)
Patient transported to CT with RN on monitor.

## 2017-06-07 NOTE — Consult Note (Addendum)
TeleNeurology Consult Note  Willie [name]     Consulting Physician:  Garken  Code Status: Prior Primary Care Physician:  O'Sullivan, [name], NP  DOB:  08/21/1975   Age: 41 y.o.  Date of Service: June 07, 2017 Admit Date:  06/07/2017   Admitting Diagnosis: <principal problem not specified>  Subjective:  Reason for Consultation: Stroke  Chief Complaint: left sided weakness  History of present illness: 41 y/o man with h/o HTN, DM, toboacco abuse who presents with left sided weakness since waking up. Last known well at 1900. Emergent telestroke consult requested. Neuroimaging reviewed and case discussed with ED staff. Patient states he has rectal bleeding. Patient with possible left upper extremity ataxia. He is complaining of left arm pain.   Review of Systems  Patient Active Problem List   Diagnosis Date Noted  . Vomiting alone 03/27/2017  . Chest pain 03/27/2017  . Nausea and vomiting   . Lactic acidosis 03/26/2017  . History of alcohol abuse 06/25/2016  . Tobacco abuse 06/25/2016  . Anxiety state 11/20/2015  . Dental infection 07/10/2015  . Mood disorder (HCC) 04/18/2015  . Eczema 06/27/2014  . GERD (gastroesophageal reflux disease) 05/24/2014  . Snoring 04/07/2013  . Suicide attempt by substance overdose (HCC) 02/10/2013  . Alcohol abuse 12/31/2012  . Erectile dysfunction 12/09/2012  . Hematuria 06/11/2012  . Vision problem 02/02/2012  . Insomnia 12/20/2011  . Depression with anxiety 11/13/2010  . Hyperlipemia 06/27/2010  . Diabetes type 2, uncontrolled (HCC) 05/22/2010  . Essential hypertension 05/22/2010   Past Medical History:  Diagnosis Date  . Alcoholic pancreatitis    recurrent  . Depression   . Diabetes mellitus, type II (HCC)    New Onset 03/2010  . GERD (gastroesophageal reflux disease)   . High cholesterol   . 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 Hypertension   . Sleep apnea    does  not wear a CPAP   Past Surgical History:  Procedure Laterality Date  . LUMBAR MICRODISCECTOMY  ~ 2004   Dr Elsner   Allergies  Allergen Reactions  . Invokamet [Canagliflozin-Metformin Hcl] Other (See Comments)    Lactic acidosis  . Metformin And Related     Drastic drop in blood sugar    Social History   Socioeconomic History  . Marital status: Married    Spouse name: Not on file  . Number of children: 2  . Years of education: Not on file  . Highest education level: Not on file  Social Needs  . Financial resource strain: Not on file  . Food insecurity - worry: Not on file  . Food insecurity - inability: Not on file  . Transportation needs - medical: Not on file  . Transportation needs - non-medical: Not on file  Occupational History  . Occupation: GENERAL MANAGER    Employer: [employer]  Tobacco Use  . Smoking status: Current Every Day Smoker    Packs/day: 0.25    Years: 1.00    Pack years: 0.25    Types: Cigarettes    Start date: 04/29/2016  . Smokeless tobacco: Never Used  . Tobacco comment: 4 Cigarettes a day   Substance and Sexual Activity  . Alcohol use: Yes  . Drug use: No    Comment: none in years  . Sexual activity: Not on file  Other Topics Concern  . Not on file  Social History Narrative   Occupation:   General Manager ( grew up in [place])   Married- 9 years (wife nurse at [phone])   1 son  11   1 daughter - 7   Never Smoked    Alcohol use-no   1 Caffeine drink daily    Family History Family History  Problem Relation Age of Onset  . Diabetes Mother   . Lung cancer Brother        twin brother  . Pancreatic cancer Paternal Aunt   . Colon cancer Neg Hx   . Stomach cancer Neg Hx       Objective:  Vital signs in last 24 hours: Temp:  [98.1 F (36.7 C)] 98.1 F (36.7 C) (02/09 1041) Pulse Rate:  [133] 133 (02/09 1038) Resp:  [24] 24 (02/09 1038) BP: (156)/(109) 156/109 (02/09 1038) SpO2:  [100 %] 100 % (02/09  1038) Temp (24hrs), Avg:98.1 F (36.7 C), Min:98.1 F (36.7 C), Max:98.1 F (36.7 C)    Intake/Output last 3 shifts: No intake/output data recorded.  Intake/Output this shift: No intake/output data recorded.  Physical Exam 1a- LOC: Keenly responsive - 0      1b- LOC questions: Answers both questions correctly - 0     1c- LOC commands- Performs both tasks correctly- 0     2- Gaze: Normal; no gaze paresis or gaze deviation - 0     3- Visual Fields: normal, no Visual field deficit - 0     4- Facial movements: no facial palsy - 0     5a- Right Upper limb motor - no drift -0     5b -Left Upper limb motor - no drift -0     6a- Right Lower limb motor - no drift - 0      6b- Left Lower limb motor - no drift - 0 7- Limb Coordination: left arm ataxia - 1      8- Sensory : left sensory loss - 2     9- Language - No aphasia - 0      10- Speech - No dysarthria -0     11- Neglect / Extinction - none found -0     NIHSS score   2  Diagnostic Findings:  Pertinent Labs: No results for input(s): WBC, MCV in the last 168 hours.  Invalid input(s): HEMATOCRIT, HEMOGLOBIN, PLATELETS No results for input(s): POTASSIUM, CHLORIDE, CALCIUM, BUN, GLUCOSE, CREATININE, CO2 in the last 168 hours.  Invalid input(s): SODIUM, ANION GAP2, GFR MDRD AF AMER No results for input(s): AST, ALT, ALBUMIN in the last 168 hours.  Invalid input(s): ALK PHOS, BILIRUBIN TOTAL, BILIRUBIN DIRECT, PROTEIN TOTAL, GLOBULIN No results for input(s): TRIG in the last 168 hours.  Invalid input(s): CHLPL, HDL PML, VLDL CALC, LDL CALC, CHOL/HDL RATIO, LDL/HDL RATIO, NON-HDL CHOLESTEROL Invalid input(s): CK TOTAL, TROPONIN I, CK MB No results for input(s): APTT in the last 168 hours. No results for input(s): INR in the last 168 hours.  Invalid input(s): PROTHROMBIN TIME  Extensive review of previous medical records completed.    Assessment: Patient Active Problem List   Diagnosis Date Noted  . Vomiting alone 03/27/2017   . Chest pain 03/27/2017  . Nausea and vomiting   . Lactic acidosis 03/26/2017  . History of alcohol abuse 06/25/2016  . Tobacco abuse 06/25/2016  . Anxiety state 11/20/2015  . Dental infection 07/10/2015  . Mood disorder (HCC) 04/18/2015  . Eczema 06/27/2014  . GERD (gastroesophageal reflux disease) 05/24/2014  . Snoring 04/07/2013  . Suicide attempt by   substance overdose ([place]) 02/10/2013  . Alcohol abuse 12/31/2012  . Erectile dysfunction 12/09/2012  . Hematuria 06/11/2012  . Vision problem 02/02/2012  . Insomnia 12/20/2011  . Depression with anxiety 11/13/2010  . Hyperlipemia 06/27/2010  . Diabetes type 2, uncontrolled ([place]) 05/22/2010  . Essential hypertension 05/22/2010     Plan: [facility] Tele[facility]urology Consult Services  Impression: Stroke     Differential Diagnosis:  1. Cardioembolic stroke  2. Small vessel disease/lacune    3. Thromboembolic, artery-to-artery mechanism 4. Hypercoagulable state-related infarct  5. Thrombotic mechanism, large artery disease 6. Transient ischemic attack 7. Conversion disorder  Comments: Last known well:   [address] OJJK:0938 [facility] contacted:   1829 [facility] at bedside:   1048 NIHSS assessment time (after neuroimaging):1111 CT head reviewed at:1057  Needle time:TPA not considered since he is out the window for TPA. Also has rectal bleeding.  Thrombectomy not considered since large vessel occlusion is not suspected     Discussion:     Our recommendations are outlined below.  Recommendations: ASA (if ok GI) IV fluids and permissive hypertension (Keep SBP < 220/110) Neurochecks DVT prophylaxis    Follow up with Neurology for further testing and evaluation  Medical Decision Making:  - Extensive number of diagnosis or management options are considered above. - Extensive amount of complex data reviewed.  - High risk of complication and/or morbidity or mortality are associated with differential  diagnostic considerations above. - There may be uncertain outcome and increased probability of prolonged functional impairment or high probability of severe prolonged functional impairment associated with some of these differential diagnosis.  Medical Data Reviewed: 1.Data reviewed include clinical labs, radiology, Medical Tests; 2.Tests results discussed w/performing or interpreting physician;    3.Obtaining/reviewing old medical records; 4.Obtaining case history from another source;    5.Independent review of image, tracing or specimen.  Signed:   06/07/2017  10:50 AM

## 2017-06-07 NOTE — ED Triage Notes (Signed)
Pt reports L arm weakness/numbness since this am. Pt unable to pinpoint an exact time. Pt states he noticed it at 6 am. Pt is visibly upset. Also reports rectal bleeding x 3 days.

## 2017-06-08 ENCOUNTER — Inpatient Hospital Stay (HOSPITAL_COMMUNITY): Payer: No Typology Code available for payment source

## 2017-06-08 ENCOUNTER — Encounter (HOSPITAL_COMMUNITY): Payer: Self-pay | Admitting: Physician Assistant

## 2017-06-08 DIAGNOSIS — K861 Other chronic pancreatitis: Secondary | ICD-10-CM

## 2017-06-08 DIAGNOSIS — R29898 Other symptoms and signs involving the musculoskeletal system: Secondary | ICD-10-CM

## 2017-06-08 DIAGNOSIS — D500 Iron deficiency anemia secondary to blood loss (chronic): Secondary | ICD-10-CM

## 2017-06-08 DIAGNOSIS — G545 Neuralgic amyotrophy: Secondary | ICD-10-CM

## 2017-06-08 DIAGNOSIS — K625 Hemorrhage of anus and rectum: Secondary | ICD-10-CM

## 2017-06-08 LAB — CBC
HEMATOCRIT: 38.4 % — AB (ref 39.0–52.0)
HEMOGLOBIN: 12.8 g/dL — AB (ref 13.0–17.0)
MCH: 29.2 pg (ref 26.0–34.0)
MCHC: 33.3 g/dL (ref 30.0–36.0)
MCV: 87.7 fL (ref 78.0–100.0)
Platelets: 267 10*3/uL (ref 150–400)
RBC: 4.38 MIL/uL (ref 4.22–5.81)
RDW: 14.2 % (ref 11.5–15.5)
WBC: 6.7 10*3/uL (ref 4.0–10.5)

## 2017-06-08 LAB — LIPID PANEL
CHOL/HDL RATIO: 1.9 ratio
CHOLESTEROL: 109 mg/dL (ref 0–200)
HDL: 58 mg/dL (ref >40)
LDL Cholesterol: 25 mg/dL (ref 0–99)
TRIGLYCERIDES: 132 mg/dL (ref <150)
VLDL: 26 mg/dL (ref 0–40)

## 2017-06-08 LAB — GLUCOSE, CAPILLARY
Glucose-Capillary: 160 mg/dL — ABNORMAL HIGH (ref 65–99)
Glucose-Capillary: 366 mg/dL — ABNORMAL HIGH (ref 65–99)
Glucose-Capillary: 227 mg/dL — ABNORMAL HIGH (ref 65–99)

## 2017-06-08 LAB — RAPID URINE DRUG SCREEN, HOSP PERFORMED
AMPHETAMINES: NOT DETECTED
BENZODIAZEPINES: POSITIVE — AB
Barbiturates: NOT DETECTED
Cocaine: NOT DETECTED
Opiates: NOT DETECTED
TETRAHYDROCANNABINOL: NOT DETECTED

## 2017-06-08 LAB — AMMONIA: AMMONIA: 51 umol/L — AB (ref 9–35)

## 2017-06-08 LAB — HEMOGLOBIN A1C
Hgb A1c MFr Bld: 8.4 % — ABNORMAL HIGH (ref 4.8–5.6)
Mean Plasma Glucose: 194.38 mg/dL

## 2017-06-08 LAB — AMYLASE: Amylase: 101 U/L — ABNORMAL HIGH (ref 28–100)

## 2017-06-08 LAB — ETHANOL

## 2017-06-08 LAB — BASIC METABOLIC PANEL
Anion gap: 12 (ref 5–15)
BUN: 9 mg/dL (ref 6–20)
CHLORIDE: 101 mmol/L (ref 101–111)
CO2: 23 mmol/L (ref 22–32)
Calcium: 8.7 mg/dL — ABNORMAL LOW (ref 8.9–10.3)
Creatinine, Ser: 0.96 mg/dL (ref 0.61–1.24)
GFR calc non Af Amer: >60 mL/min (ref >60)
Glucose, Bld: 80 mg/dL (ref 65–99)
POTASSIUM: 3.8 mmol/L (ref 3.5–5.1)
SODIUM: 136 mmol/L (ref 135–145)

## 2017-06-08 LAB — VITAMIN B12: Vitamin B-12: 783 pg/mL (ref 180–914)

## 2017-06-08 LAB — OCCULT BLOOD X 1 CARD TO LAB, STOOL: Fecal Occult Bld: POSITIVE — AB

## 2017-06-08 LAB — LIPASE, BLOOD: Lipase: 17 U/L (ref 11–51)

## 2017-06-08 MED ORDER — IOPAMIDOL (ISOVUE-300) INJECTION 61%
INTRAVENOUS | Status: AC
  Filled 2017-06-08: qty 100

## 2017-06-08 MED ORDER — PANTOPRAZOLE SODIUM 40 MG PO TBEC
40.0000 mg | DELAYED_RELEASE_TABLET | Freq: Every day | ORAL | Status: DC
Start: 2017-06-09 — End: 2017-06-10
  Administered 2017-06-09 – 2017-06-10 (×2): 40 mg via ORAL
  Filled 2017-06-08 (×2): qty 1

## 2017-06-08 MED ORDER — HYDROCORTISONE ACETATE 25 MG RE SUPP
25.0000 mg | Freq: Two times a day (BID) | RECTAL | Status: DC
  Administered 2017-06-08 – 2017-06-10 (×5): 25 mg via RECTAL
  Filled 2017-06-08 (×11): qty 1

## 2017-06-08 NOTE — Evaluation (Addendum)
Physical Therapy Evaluation Patient Details Name: Willie [name] MRN: [id number] DOB: 04-20-1976 Today's Date: 06/08/2017   History of Present Illness  42 year old man with prior history of alcohol abuse, chronic pancreatitis on imaging CT scan 2018, current smoker, insulin-dependent diabetes, who presented with left upper extremity weakness. MRI negative.     Clinical Impression  Pt admitted with above diagnosis. Pt currently with functional limitations due to the deficits listed below (see PT Problem List). PTA, pt lived alone independent with mobility. On eval, pt demonstrated independence with bed mobility and transfers. Min guard assist for safety provided for ambulation 150 feet without AD. Pt inconsistent in his presentation. Able to bend over in room to pick up items from floor and make quick turns during functional tasks without difficulty. However, during ambulation in hallway he exhibited a brief episode of LOB/scissoring gait with c/o lightheadedness.  Pt able to self correct. No physical assist needed. Pt will benefit from skilled PT to increase their independence and safety with mobility to allow discharge to the venue listed below.  PT to follow acutely. No follow up services indicated.      Follow Up Recommendations No PT follow up    Equipment Recommendations  None recommended by PT    Recommendations for Other Services       Precautions / Restrictions Precautions Precautions: None      Mobility  Bed Mobility Overal bed mobility: Independent                Transfers Overall transfer level: Independent Equipment used: None                Ambulation/Gait Ambulation/Gait assistance: Min guard Ambulation Distance (Feet): 150 Feet Assistive device: None Gait Pattern/deviations: Step-through pattern Gait velocity: decreased Gait velocity interpretation: Below normal speed for age/gender General Gait Details: Pt with brief episode of scissoring  gait. Able to self correct. Also with report of feeling lightheaded.   Stairs            Wheelchair Mobility    Modified Rankin (Stroke Patients Only)       Balance Overall balance assessment: Modified Independent Sitting-balance support: Feet supported;No upper extremity supported Sitting balance-Leahy Scale: Normal     Standing balance support: No upper extremity supported;During functional activity Standing balance-Leahy Scale: Good                               Pertinent Vitals/Pain Pain Assessment: 0-10 Pain Score: 6  Pain Location: L shoulder Pain Descriptors / Indicators: Grimacing;Guarding;Sore Pain Intervention(s): Monitored during session    Home Living Family/patient expects to be discharged to:: Private residence Living Arrangements: Alone Available Help at Discharge: Family;Available PRN/intermittently Type of Home: Apartment Home Access: Stairs to enter Entrance Stairs-Rails: Right Entrance Stairs-Number of Steps: flight Home Layout: One level Home Equipment: None      Prior Function Level of Independence: Independent         Comments: Pt works at [employer].      Hand Dominance   Dominant Hand: Right    Extremity/Trunk Assessment   Upper Extremity Assessment Upper Extremity Assessment: Defer to OT evaluation    Lower Extremity Assessment Lower Extremity Assessment: Overall WFL for tasks assessed    Cervical / Trunk Assessment Cervical / Trunk Assessment: Normal  Communication   Communication: No difficulties  Cognition Arousal/Alertness: Awake/alert Behavior During Therapy: WFL for tasks assessed/performed Overall Cognitive Status: Within Functional Limits  for tasks assessed                                        General Comments      Exercises     Assessment/Plan    PT Assessment Patient needs continued PT services  PT Problem List Decreased mobility;Pain;Decreased  balance       PT Treatment Interventions Therapeutic activities;Gait training;Patient/family education;Therapeutic exercise;Stair training;Functional mobility training;Balance training    PT Goals (Current goals can be found in the Care Plan section)  Acute Rehab PT Goals Patient Stated Goal: figure out what's wrong PT Goal Formulation: With patient Time For Goal Achievement: 06/22/17 Potential to Achieve Goals: Good    Frequency Min 3X/week   Barriers to discharge        Co-evaluation               AM-PAC PT "6 Clicks" Daily Activity  Outcome Measure Difficulty turning over in bed (including adjusting bedclothes, sheets and blankets)?: None Difficulty moving from lying on back to sitting on the side of the bed? : None Difficulty sitting down on and standing up from a chair with arms (e.g., wheelchair, bedside commode, etc,.)?: None Help needed moving to and from a bed to chair (including a wheelchair)?: None Help needed walking in hospital room?: None Help needed climbing 3-5 steps with a railing? : A Little 6 Click Score: 23    End of Session Equipment Utilized During Treatment: Gait belt Activity Tolerance: Patient tolerated treatment well Patient left: in bed;with call bell/phone within reach Nurse Communication: Mobility status PT Visit Diagnosis: Unsteadiness on feet (R26.81)    Time: 5170-0174 PT Time Calculation (min) (ACUTE ONLY): 15 min   Charges:   PT Evaluation $PT Eval Moderate Complexity: 1 Mod     PT G Codes:        Lorrin Goodell, PT  Office # [phone] Pager [phone]   Lorriane Shire 06/08/2017, 12:49 PM

## 2017-06-08 NOTE — Consult Note (Signed)
Referring Physician: Patrecia Pour, Christean Grief, MD    Chief Complaint: Left arm weakness and ataxia  HPI: Willie [name] is an 42 y.o. male with a past medical history of alcohol abuse, chronic pancreatitis on imaging CT scan 2018, current smoker, insulin-dependent diabetes, who presented with left upper extremity weakness.  Patient woke up around 9 AM to realize he had impaired range of motion in his left upper extremities from his shoulders to his fingers with poor coordination and weakness, numbness and tingling only when sitting or standing but normal when laying down.  Symptoms were associated with stiffness in his arm, inability to raise left arm above shoulders and left arm was flailing when trying to do purposeful activity.  The symptoms were not associated with slurred speech, loss of consciousness, headache, dizziness, visual disturbance or other extremity more to deficits.  But he did describe having sharp stabbing chest pain 10 out of 10.    He went to ER at [facility], neurology was consulted and did not recommend thrombolytics due to patient only had a left upper extremity ataxia.  This Noncon CT of the head, CTA of the head and neck did not reveal any further findings.  Patient was transferred to [facility] for further evaluation.  MRI of the brain as well as MRA of the head and neck was also negative.  Patient admits to history of low back pain but denies any recent neck trauma or head trauma.  Admits to improvement of weakness in the left upper extremity but numbness and burning as well as ataxia continues to persist. Neuro was consulted for further evaluation  This is the first time he is having the symptoms.  Never had these symptoms before.  No history of strokes.  Brother had a stroke in his mid 70s.   LSN: 06/06/17- bedtime tPA Given: No:   Premorbid modified Rankin scale (mRS): 0  Past Medical History:  Diagnosis Date  . Alcoholic pancreatitis    recurrent  . Depression   .  Diabetes mellitus, type II ([place])    New Onset 03/2010  . GERD (gastroesophageal reflux disease)   . High cholesterol   . 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 Hypertension   . Sleep apnea    does not wear a CPAP    Past Surgical History:  Procedure Laterality Date  . COLONOSCOPY W/ BIOPSIES  09/2010   Dr. Ardis Hughs.  for intermittent rectal bleeding.  Mild sigmoid to descending diverticulosis.  Mild left colon erythema, benign biopsy, probably "prep effect"  . LUMBAR MICRODISCECTOMY  ~ 2004   Dr Ellene Route    Family History  Problem Relation Age of Onset  . Diabetes Mother   . Lung cancer Brother        twin brother  . Pancreatic cancer Paternal Aunt   . Colon cancer Neg Hx   . Stomach cancer Neg Hx    Social History:  reports that he has been smoking cigarettes.  He started smoking about 13 months ago. He has a 0.25 pack-year smoking history. he has never used smokeless tobacco. He reports that he drinks alcohol. He reports that he does not use drugs.  Allergies:  Allergies  Allergen Reactions  . Invokamet [Canagliflozin-Metformin Hcl] Other (See Comments)    Lactic acidosis  . Metformin And Related Other (See Comments)    DRASTIC drop in blood sugar    Medications:  Scheduled: .  amLODipine  10 mg Oral Daily  . atorvastatin  20 mg Oral Daily  . cloNIDine  0.1 mg Oral TID  . escitalopram  20 mg Oral Daily  . folic acid  1 mg Oral Daily  . hydrocortisone  25 mg Rectal BID  . insulin aspart  0-15 Units Subcutaneous Q6H  . insulin glargine  20 Units Subcutaneous QHS  . lipase/protease/amylase  36,000 Units Oral TID WC  . lisinopril  20 mg Oral Daily  . multivitamin with minerals  1 tablet Oral Daily  . [START ON 06/09/2017] pantoprazole  40 mg Oral Q0600  . QUEtiapine  300 mg Oral QHS  . thiamine  100 mg Oral Daily   Or  . thiamine  100 mg Intravenous Daily    ROS: 13 points systems  reviewed with patient and are  negative except for above in HPI  Physical Examination: Blood pressure 122/86, pulse (!) 106, temperature 98.4 F (36.9 C), temperature source Oral, resp. rate 16, height 6' (1.829 m), SpO2 99 %. HEENT-  Normocephalic, no lesions, without obvious abnormality.  Normal external eye and conjunctiva.  Cardiovascular- S1-S2 audible, pulses palpable throughout   Lungs-no rhonchi or wheezing noted, no excessive working breathing.  Saturations within normal limits Abdomen- All 4 quadrants palpated and nontender Musculoskeletal-left hsoulder joint tenderness, no deformity or swelling, moves all extremities equally except for left arm Skin-warm and dry  Neurological Examination Mental Status: Alert, oriented, thought content appropriate.  Speech fluent without evidence of aphasia.  Able to follow 3 step commands without difficulty. Cranial Nerves: II: Discs flat bilaterally; Visual fields grossly normal,  III,IV, VI: ptosis not present, extra-ocular motions intact bilaterally pupils equal, round, reactive to light and accommodation V,VII: smile symmetric, facial light touch sensation normal bilaterally VIII: Hearing intact to voice IX,X: uvula rises symmetrically XI: bilateral shoulder shrug XII: midline tongue extension Motor: Decreased range of motion in left arm with shoulder pain with movement and left arm ataxia only when sitting up.  Normal left arm movements without ataxia when lying down Right : Upper extremity   5/5    Left:     Upper extremity   5/5  Lower extremity   5/5     Lower extremity   5/5 Tone and bulk:normal tone throughout; no atrophy noted Sensory: Pinprick and light touch decreased in left arm except hands.  Essential tremors left arm Deep Tendon Reflexes: 2+ and symmetric throughout Plantars: Right: downgoing   Left: downgoing Cerebellar: normal finger-to-nose, normal rapid alternating movements and normal heel-to-shin test Gait: normal gait and station  NIHSS 1a  Level of Conscious.: 0 1b LOC Questions: 0 1c LOC Commands: 0 2 Best Gaze: 0 3 Visual: 0 4 Facial Palsy: 0 5a Motor Arm - left: 0 5b Motor Arm - Right: 0 6a Motor Leg - Left: 0 6b Motor Leg - Right: 0 7 Limb Ataxia: 0 8 Sensory: 1 9 Best Language: 0 10 Dysarthria: 0 11 Extinct. and Inatten.: 0 TOTAL: 1   Ct Angio Head/Neck W And/or Wo Contrast 06/07/2017 IMPRESSION:  Negative CTA of the head and neck.  Mr Jodene Nam Head Wo Contrast 06/07/2017  IMPRESSION:  1. Normal MRI of the brain. 2. Normal MRA of the head.   Ct Head Code Stroke Wo Contrast 06/07/2017 MPRESSION:  Negative head CT.  ASPECTS is 10.    Assessment: 42 y.o. male with a past medical history of alcohol abuse, chronic pancreatitis on imaging CT scan 2018, current smoker, insulin-dependent diabetes, who presented with  left upper extremity weakness, numbness and tingling and ataxia only when sitting up. 1.  Left upper extremity ataxia-only present when sitting up or standing.  Differential diagnosis includes CVA, alcohol withdrawal induced tremors, shoulder pain with impaired mobility and electrolyte imbalances vs conversion disorder in patient with anxiety.  Another important differential is Parson[place]-Turner syndrome. -Patient has had head imaging as well as head and neck vascular imaging studies which have all been negative.  Patient denies neck pain or upper back pain concerning the symptoms only when sitting up.  Will complete full stroke workup with echocardiogram which has already been ordered.  Physical therapy patient therapy has evaluated patient and have no further recommendations.  We will also order x-ray of the shoulder to rule out musculoskeletal cause of his weakness.  Patient does have his risk factors for stroke with morbid obesity, daily smoker, and diabetes mellitus.  Currently pending hemoglobin A1c and lipid panel.  Will check B12/Folate levels 2.  Alcohol abuse likely an alcohol withdrawal-continue CIWA protocol  with Ativan.  3.  Anxiety  Stroke Risk Factors - diabetes mellitus, hyperlipidemia, hypertension and smoking  Plan: - HgbA1c, fasting lipid panel - PT consult, OT consult, Speech consult - Echocardiogram -X-ray of  left shoulders - Risk factor modification - Telemetry monitoring -  Frequent neuro checks - Continue anxiolytics -Tobacco and alcohol alcohol cessation discussed with patient  -- Jacob Moores DNP Neuro-hospitalist Team [phone] 06/08/2017, 2:15 PM   Attending Neurohospitalist Addendum Patient seen and examined with APP/Resident. Agree with the history and physical as documented above. Agree with the plan as documented, which I helped formulate. I have reviewed imaging.  MRI of the brain and MRA of the head is normal. In addition to above, please see below:  Differentials include Parsonage-Turner syndrome versus brachial plexopathy. Less likely stroke with only arm involvement and pain along with a negative brain MRI but given the family history of stroke and extensive risk factors, agree with completing the stroke workup. Needs outpatient EMG nerve conduction studies. Can use Tylenol for pain control in the arm.  Can also use NSAIDs but given the current GI bleed, would avoid them for now.  Can resume NSAIDs when okay with GI and primary team. Continue PT OT  Stroke team to follow in the morning once the remainder of the workup is complete.  --- Amie Portland, MD [facility] Pager: [phone]  If 7pm to 7am, please call on call as listed on AMION.

## 2017-06-08 NOTE — Plan of Care (Signed)
Patient educated on anxiety reduction strategies, agreeable with plan, denies questions/concerns at this time.

## 2017-06-08 NOTE — Progress Notes (Signed)
PROGRESS NOTE [facility]   Willie [name]   OEU:[id number] DOB: 1975/12/15  DOA: 06/07/2017 PCP: Debbrah Alar, NP   Brief Narrative:  Willie [name] is a 42 year old male with history of alcohol abuse, hypertension, diabetes type 2, chronic pancreatitis, who presented to the emergency department with left upper extremity weakness.  Upon ED evaluation felt that he had left upper limb ataxia, teleneurology was consulted and recommended CTA, MRI and MRA,  Patient was admitted for possible alcohol withdrawal and stroke workup.  Upon further investigation patient reported having rectal bleed for about a week.  GI has been consulted  Subjective: Patient seen and examined, symptoms continues to persist.  Unable to move left arm well when sitting or standing.  Not able to grab spontaneously with the left hand.  Tremors on the right hand.  Pain on left shoulder.  Denies neck pain.  Chest pain has resolved. Patient vague when ask about alcohol use   Assessment & Plan: Left upper extremity ataxia MRI/MRA and CTA unremarkable Felt that possible alcohol withdrawal inducing tremors, neurology has been consulted X-ray of left shoulder shows stable degenerative changes on the [facility] joint Unclear etiology at this time, patient denies any trauma.  MRI negative for stroke, ?  Conversion disorder Will add UDS Ammonia slightly elevated ? Hepatic diseases, asterixis patient is not encephalopathic  Completing stroke w/u per neurology  Follow up neurology recommendations   Alcohol abuse with ? Withdrawal  ? If this is contributing to symptoms Continue CIWA protocol for now with Ativan, would change to librium but liver enzyme slight up  Continue to monitor closely   Acute blood loss anemia  Patient with hx of hemorrhoids, had similar symptoms in the past, coloscopy in 2012 show no significant findings. ? If alcohol related  Hemoglobin drop ~ 2 grams  FOBT pending  GI was consulted  recommended Anusol and Protonix PO, may need Flex sig vs colonoscopy, pending neurology clearance for anesthesia  Check CBC in AM  Transfuse if Hgb less than 8 or become symptomatic   Chronic pancreatitis   I have ordered a CT abd/pelvis as patient was c/o epigastric pain to rule out acute pancreatitis but GI cancelled the study as felt that was not necessary at this point, will check Lipase and amylase.  Continue creon  Continue to supportive treatment for now   DM type 2  CBG stable  Continue Lantus and SSI   HTN  BP stable  Continue current regimen   Anxiety/Depression  Continue home medications   DVT prophylaxis: SCD's  Code Status: Full Code Family Communication: None at bedside  Disposition Plan: Home when medically stable   Consultants:   Neurology   GI   Procedures:   None   Antimicrobials:  None     Objective: Vitals:   06/08/17 0200 06/08/17 0551 06/08/17 1041 06/08/17 1424  BP:  133/89 122/86 125/85  Pulse: (!) 117 96 (!) 106 (!) 111  Resp:  18 16 20   Temp:  98.9 F (37.2 C) 98.4 F (36.9 C) 97.9 F (36.6 C)  TempSrc:  Oral Oral Oral  SpO2:  100% 99% 100%  Height:        Intake/Output Summary (Last 24 hours) at 06/08/2017 1620 Last data filed at 06/08/2017 0600 Gross per 24 hour  Intake 693.75 ml  Output -  Net 693.75 ml   There were no vitals filed for this visit.  Examination:  General exam: NAD  HEENT: PERRLA, OP moist and  clear Respiratory system: Clear to auscultation. No wheezes,crackle or rhonchi Cardiovascular system: S1 & S2 heard, RRR. No JVD, murmurs, rubs or gallops Gastrointestinal system: Abd soft, mild epigastric tenderness, no organomegaly. No masses, rectal exam deferred as GI performed one  Central nervous system: Alert and oriented. No focal findings, left arm ataxia when sitting up  Extremities: No LE edema, strength 4/5 in left arm, decrease ROM in left arm. R arm tremors with extension Skin: No rashes, lesions or  ulcers Psychiatry: Mood & affect anxious   Data Reviewed: I have personally reviewed following labs and imaging studies  CBC: Recent Labs  Lab 06/07/17 1044 06/08/17 0509  WBC 8.9 6.7  NEUTROABS 5.9  --   HGB 15.2 12.8*  HCT 42.6 38.4*  MCV 84.4 87.7  PLT 355 983   Basic Metabolic Panel: Recent Labs  Lab 06/07/17 1044 06/08/17 0509  NA 137 136  K 3.9 3.8  CL 100* 101  CO2 24 23  GLUCOSE 88 80  BUN 16 9  CREATININE 1.33* 0.96  CALCIUM 8.7* 8.7*   GFR: CrCl cannot be calculated (Unknown ideal weight.). Liver Function Tests: Recent Labs  Lab 06/07/17 1044  AST 66*  ALT 53  ALKPHOS 107  BILITOT 0.9  PROT 7.8  ALBUMIN 3.7   No results for input(s): LIPASE, AMYLASE in the last 168 hours. No results for input(s): AMMONIA in the last 168 hours. Coagulation Profile: Recent Labs  Lab 06/07/17 1044  INR 1.01   Cardiac Enzymes: Recent Labs  Lab 06/07/17 1044 06/07/17 2007  TROPONINI <0.03 <0.03   BNP (last 3 results) No results for input(s): PROBNP in the last 8760 hours. HbA1C: Recent Labs    06/08/17 0509  HGBA1C 8.4*   CBG: Recent Labs  Lab 06/07/17 1042 06/07/17 1432 06/07/17 2012 06/08/17 0230  GLUCAP 70 170* 100* 160*   Lipid Profile: Recent Labs    06/08/17 0509  CHOL 109  HDL 58  LDLCALC 25  TRIG 132  CHOLHDL 1.9   Thyroid Function Tests: No results for input(s): TSH, T4TOTAL, FREET4, T3FREE, THYROIDAB in the last 72 hours. Anemia Panel: No results for input(s): VITAMINB12, FOLATE, FERRITIN, TIBC, IRON, RETICCTPCT in the last 72 hours. Sepsis Labs: No results for input(s): PROCALCITON, LATICACIDVEN in the last 168 hours.  No results found for this or any previous visit (from the past 240 hour(s)).    Radiology Studies: Ct Angio Head W Or Wo Contrast  Result Date: 06/07/2017 CLINICAL DATA:  Patient woke with left arm weakness and numbness. EXAM: CT ANGIOGRAPHY HEAD AND NECK TECHNIQUE: Multidetector CT imaging of the head  and neck was performed using the standard protocol during bolus administration of intravenous contrast. Multiplanar CT image reconstructions and MIPs were obtained to evaluate the vascular anatomy. Carotid stenosis measurements (when applicable) are obtained utilizing NASCET criteria, using the distal internal carotid diameter as the denominator. CONTRAST:  116mL ISOVUE-370 IOPAMIDOL (ISOVUE-370) INJECTION 76% COMPARISON:  CTA of the neck 07/21/2014 FINDINGS: CTA NECK FINDINGS Aortic arch: Normal appearance.  Four vessel branching. Right carotid system: Mild tortuosity with partial retropharyngeal course. No atheromatous changes, beading, or stenosis. Left carotid system: Mild tortuosity with partial retropharyngeal course. No beading, stenosis, or atheromatous change. Vertebral arteries: No proximal subclavian stenosis. The left vertebral artery arises from the arch. Both vertebral arteries are smooth and diffusely patent. No atheromatous changes. Skeleton: No acute or aggressive finding. Other neck: No incidental mass or inflammation. Upper chest: Negative Review of the MIP images confirms the  above findings CTA HEAD FINDINGS Diminished detail due to contrast timing and arterial density. Anterior circulation: No atheromatous changes, stenosis, occlusion, or aneurysm. Posterior circulation: Codominant vertebral arteries. Bilateral picas patent. Vertebral and basilar arteries are smooth and widely patent. Symmetric robust PCA flow. Venous sinuses: Patent Anatomic variants: None significant Delayed phase: No abnormal intracranial enhancement. Review of the MIP images confirms the above findings IMPRESSION: Negative CTA of the head and neck. Electronically Signed   By: Monte Fantasia M.D.   On: 06/07/2017 11:31   Dg Chest 2 View  Result Date: 06/07/2017 CLINICAL DATA:  LEFT arm weakness, dry heaves, type II diabetes mellitus, GERD, hypertension EXAM: CHEST  2 VIEW COMPARISON:  03/26/2017 FINDINGS: Upper normal  heart size. Mediastinal contours and pulmonary vascularity normal. Lungs clear. No pleural effusion or pneumothorax. Bones unremarkable. IMPRESSION: No acute abnormalities. Electronically Signed   By: Lavonia Dana M.D.   On: 06/07/2017 20:05   Ct Angio Neck W And/or Wo Contrast  Result Date: 06/07/2017 CLINICAL DATA:  Patient woke with left arm weakness and numbness. EXAM: CT ANGIOGRAPHY HEAD AND NECK TECHNIQUE: Multidetector CT imaging of the head and neck was performed using the standard protocol during bolus administration of intravenous contrast. Multiplanar CT image reconstructions and MIPs were obtained to evaluate the vascular anatomy. Carotid stenosis measurements (when applicable) are obtained utilizing NASCET criteria, using the distal internal carotid diameter as the denominator. CONTRAST:  115mL ISOVUE-370 IOPAMIDOL (ISOVUE-370) INJECTION 76% COMPARISON:  CTA of the neck 07/21/2014 FINDINGS: CTA NECK FINDINGS Aortic arch: Normal appearance.  Four vessel branching. Right carotid system: Mild tortuosity with partial retropharyngeal course. No atheromatous changes, beading, or stenosis. Left carotid system: Mild tortuosity with partial retropharyngeal course. No beading, stenosis, or atheromatous change. Vertebral arteries: No proximal subclavian stenosis. The left vertebral artery arises from the arch. Both vertebral arteries are smooth and diffusely patent. No atheromatous changes. Skeleton: No acute or aggressive finding. Other neck: No incidental mass or inflammation. Upper chest: Negative Review of the MIP images confirms the above findings CTA HEAD FINDINGS Diminished detail due to contrast timing and arterial density. Anterior circulation: No atheromatous changes, stenosis, occlusion, or aneurysm. Posterior circulation: Codominant vertebral arteries. Bilateral picas patent. Vertebral and basilar arteries are smooth and widely patent. Symmetric robust PCA flow. Venous sinuses: Patent Anatomic  variants: None significant Delayed phase: No abnormal intracranial enhancement. Review of the MIP images confirms the above findings IMPRESSION: Negative CTA of the head and neck. Electronically Signed   By: Monte Fantasia M.D.   On: 06/07/2017 11:31   Mr Brain Wo Contrast  Result Date: 06/07/2017 CLINICAL DATA:  42 y/o  M; left arm weakness and numbness. EXAM: MRI HEAD WITHOUT CONTRAST MRA HEAD WITHOUT CONTRAST TECHNIQUE: Multiplanar, multiecho pulse sequences of the brain and surrounding structures were obtained without intravenous contrast. Angiographic images of the head were obtained using MRA technique without contrast. COMPARISON:  06/07/2017 CT head and CTA head. FINDINGS: MRI HEAD FINDINGS Brain: No acute infarction, hemorrhage, hydrocephalus, extra-axial collection or mass lesion. No structural or significant signal abnormality of the brain. Vascular: As below. Skull and upper cervical spine: Normal marrow signal. Sinuses/Orbits: Mild sphenoid and left maxillary sinus mucosal thickening. No abnormal signal of mastoid air cells. Orbits are unremarkable. Other: None. MRA HEAD FINDINGS Internal carotid arteries:  Patent. Anterior cerebral arteries:  Patent. Middle cerebral arteries: Patent. Anterior communicating artery: Patent. Posterior communicating arteries:  Patent. Posterior cerebral arteries:  Patent. Basilar artery:  Patent. Vertebral arteries:  Patent. No evidence  of high-grade stenosis, large vessel occlusion, or aneurysm. IMPRESSION: 1. Normal MRI of the brain. 2. Normal MRA of the head. Electronically Signed   By: Kristine Garbe M.D.   On: 06/07/2017 23:30   Dg Shoulder Left  Result Date: 06/08/2017 CLINICAL DATA:  Left arm weakness and numbness beginning at 6 a.m. yesterday. Decreased range of motion. EXAM: LEFT SHOULDER - 2+ VIEW COMPARISON:  Left shoulder radiographs 09/13/2015 FINDINGS: Degenerative changes of the left AC joint are stable. The clavicle is intact. The left  shoulder is located. No acute bone or soft tissue abnormalities are present. The left hemithorax is clear. IMPRESSION: 1. No acute cardiopulmonary disease. 2. Stable degenerative changes at the left [facility] joint. Electronically Signed   By: San Morelle M.D.   On: 06/08/2017 16:17   Mr Jodene Nam Head Wo Contrast  Result Date: 06/07/2017 CLINICAL DATA:  42 y/o  M; left arm weakness and numbness. EXAM: MRI HEAD WITHOUT CONTRAST MRA HEAD WITHOUT CONTRAST TECHNIQUE: Multiplanar, multiecho pulse sequences of the brain and surrounding structures were obtained without intravenous contrast. Angiographic images of the head were obtained using MRA technique without contrast. COMPARISON:  06/07/2017 CT head and CTA head. FINDINGS: MRI HEAD FINDINGS Brain: No acute infarction, hemorrhage, hydrocephalus, extra-axial collection or mass lesion. No structural or significant signal abnormality of the brain. Vascular: As below. Skull and upper cervical spine: Normal marrow signal. Sinuses/Orbits: Mild sphenoid and left maxillary sinus mucosal thickening. No abnormal signal of mastoid air cells. Orbits are unremarkable. Other: None. MRA HEAD FINDINGS Internal carotid arteries:  Patent. Anterior cerebral arteries:  Patent. Middle cerebral arteries: Patent. Anterior communicating artery: Patent. Posterior communicating arteries:  Patent. Posterior cerebral arteries:  Patent. Basilar artery:  Patent. Vertebral arteries:  Patent. No evidence of high-grade stenosis, large vessel occlusion, or aneurysm. IMPRESSION: 1. Normal MRI of the brain. 2. Normal MRA of the head. Electronically Signed   By: Kristine Garbe M.D.   On: 06/07/2017 23:30   Ct Head Code Stroke Wo Contrast  Result Date: 06/07/2017 CLINICAL DATA:  Code stroke. Left arm numbness and weakness since awakening. EXAM: CT HEAD WITHOUT CONTRAST TECHNIQUE: Contiguous axial images were obtained from the base of the skull through the vertex without intravenous contrast.  COMPARISON:  12/24/2014 FINDINGS: Brain: No evidence of acute infarction, hemorrhage, hydrocephalus, extra-axial collection or mass lesion/mass effect. Vascular: No hyperdense vessel or unexpected calcification. Skull: Normal. Negative for fracture or focal lesion. Sinuses/Orbits: No acute finding. Other: These results were called by telephone at the time of interpretation on 06/07/2017 at 11:01 am to Dr. Orlie Dakin , who verbally acknowledged these results. ASPECTS [facility] Stroke Program Early CT Score) - Ganglionic level infarction (caudate, lentiform nuclei, internal capsule, insula, M1-M3 cortex): 7 - Supraganglionic infarction (M4-M6 cortex): 3 Total score (0-10 with 10 being normal): 10 IMPRESSION: Negative head CT.  ASPECTS is 10. Electronically Signed   By: Monte Fantasia M.D.   On: 06/07/2017 11:02      Scheduled Meds: . amLODipine  10 mg Oral Daily  . atorvastatin  20 mg Oral Daily  . cloNIDine  0.1 mg Oral TID  . escitalopram  20 mg Oral Daily  . folic acid  1 mg Oral Daily  . hydrocortisone  25 mg Rectal BID  . insulin aspart  0-15 Units Subcutaneous Q6H  . insulin glargine  20 Units Subcutaneous QHS  . lipase/protease/amylase  36,000 Units Oral TID WC  . lisinopril  20 mg Oral Daily  . multivitamin with minerals  1 tablet Oral Daily  . [START ON 06/09/2017] pantoprazole  40 mg Oral Q0600  . QUEtiapine  300 mg Oral QHS  . thiamine  100 mg Oral Daily   Or  . thiamine  100 mg Intravenous Daily   Continuous Infusions:   LOS: 1 day    Time spent: Total of 25 minutes spent with pt, greater than 50% of which was spent in discussion of  treatment, counseling and coordination of care   Chipper Oman, MD Pager: Text Page via [email or web address]   If 7PM-7AM, please contact night-coverage [email or web address] 06/08/2017, 4:20 PM

## 2017-06-08 NOTE — Evaluation (Signed)
Occupational Therapy Evaluation Patient Details Name: Willie [name] MRN: [id number] DOB: 04-08-1976 Today's Date: 06/08/2017    History of Present Illness 42 year old man with prior history of alcohol abuse, chronic pancreatitis on imaging CT scan 2018, current smoker, insulin-dependent diabetes, who presented with left upper extremity weakness. MRI negative.    Clinical Impression   Pt admitted with the above diagnoses and presents with below problem list. Pt will benefit from continued acute OT to address the below listed deficits and maximize independence with basic ADLs prior to d/c home. PTA pt was independent, works in Mudlogger at [employer]. Pt with inconsistent presentation of LUE weakness, scissoring gait noted at times during household distance functional mobility. Discussed ROM exercises to facilitate regaining baseline function/strength in LUE.      Follow Up Recommendations  Outpatient OT    Equipment Recommendations  None recommended by OT    Recommendations for Other Services       Precautions / Restrictions Precautions Precautions: None      Mobility Bed Mobility Overal bed mobility: Independent                Transfers Overall transfer level: Independent Equipment used: None                  Balance Overall balance assessment: Modified Independent Sitting-balance support: Feet supported;No upper extremity supported Sitting balance-Leahy Scale: Normal     Standing balance support: No upper extremity supported;During functional activity Standing balance-Leahy Scale: Good                             ADL either performed or assessed with clinical judgement   ADL Overall ADL's : Needs assistance/impaired Eating/Feeding: Set up;Sitting   Grooming: Set up;Minimal assistance;Standing Grooming Details (indicate cue type and reason): at times needed help turning on/off water faucet. inconsistent. used dominant right hand in  isolation for most tasks with L arm in a flexed position.  Upper Body Bathing: Set up;Sitting;Minimal assistance   Lower Body Bathing: Min guard;Minimal assistance;Sit to/from stand   Upper Body Dressing : Set up;Minimal assistance;Sitting   Lower Body Dressing: Set up;Min guard;Sit to/from stand   Toilet Transfer: Min guard;Ambulation   Toileting- Clothing Manipulation and Hygiene: Min guard;Sit to/from stand   Tub/ Shower Transfer: Min guard;Ambulation   Functional mobility during ADLs: Min guard General ADL Comments: Mostly min guard for functional mobility/transfers. Scissoring gait with some unsteadiness but no overt LOB. Pt completed 3 grooming tasks standing at sink and completed household distance functional mobility.      Vision         Perception     Praxis      Pertinent Vitals/Pain Pain Assessment: No/denies pain Pain Score: 6  Pain Location: L shoulder Pain Descriptors / Indicators: Grimacing;Guarding;Sore Pain Intervention(s): Monitored during session     Hand Dominance Right   Extremity/Trunk Assessment Upper Extremity Assessment Upper Extremity Assessment: LUE deficits/detail LUE Deficits / Details: inconsistent findings. performs movements better with functional tasks than ROM/MMT testing. Able to reach out to take hold of a full cup and able to pass a filled small bag to someone behind him but struggled with these movements during testing.   Lower Extremity Assessment Lower Extremity Assessment: Defer to PT evaluation   Cervical / Trunk Assessment Cervical / Trunk Assessment: Normal   Communication Communication Communication: No difficulties   Cognition Arousal/Alertness: Awake/alert Behavior During Therapy: WFL for tasks assessed/performed Overall Cognitive Status:  Within Functional Limits for tasks assessed                                     General Comments  Pt's mother and another family member present during evaluation.      Exercises     Shoulder Instructions      Home Living Family/patient expects to be discharged to:: Private residence Living Arrangements: Alone Available Help at Discharge: Family;Available PRN/intermittently Type of Home: Apartment Home Access: Stairs to enter Entrance Stairs-Number of Steps: flight Entrance Stairs-Rails: Right Home Layout: One level     Bathroom Shower/Tub: Teacher, early years/pre: Standard     Home Equipment: None          Prior Functioning/Environment Level of Independence: Independent        Comments: Pt works at [employer], Mudlogger job.        OT Problem List: Decreased strength;Decreased range of motion;Impaired balance (sitting and/or standing);Decreased knowledge of use of DME or AE;Decreased knowledge of precautions;Impaired UE functional use      OT Treatment/Interventions: Self-care/ADL training;Therapeutic exercise;Neuromuscular education;DME and/or AE instruction;Therapeutic activities;Patient/family education;Balance training    OT Goals(Current goals can be found in the care plan section) Acute Rehab OT Goals Patient Stated Goal: figure out what's wrong OT Goal Formulation: With patient Time For Goal Achievement: 06/22/17 Potential to Achieve Goals: Good ADL Goals Pt Will Perform Grooming: Independently;standing Pt Will Perform Upper Body Dressing: Independently;sitting Pt Will Perform Lower Body Dressing: Independently;sit to/from stand Pt Will Perform Tub/Shower Transfer: Independently;ambulating Pt/caregiver will Perform Home Exercise Program: Left upper extremity;Independently  OT Frequency: Min 2X/week   Barriers to D/C:            Co-evaluation              AM-PAC PT "6 Clicks" Daily Activity     Outcome Measure Help from another person eating meals?: None Help from another person taking care of personal grooming?: A Little Help from another person toileting, which includes using  toliet, bedpan, or urinal?: A Little Help from another person bathing (including washing, rinsing, drying)?: A Little Help from another person to put on and taking off regular upper body clothing?: A Little Help from another person to put on and taking off regular lower body clothing?: A Little 6 Click Score: 19   End of Session Equipment Utilized During Treatment: Gait belt  Activity Tolerance: Patient tolerated treatment well Patient left: in bed;with call bell/phone within reach;with bed alarm set;with family/visitor present  OT Visit Diagnosis: Unsteadiness on feet (R26.81);Other (comment)(LUE weakness)                Time: 7416-3845 OT Time Calculation (min): 21 min Charges:  OT General Charges $OT Visit: 1 Visit OT Evaluation $OT Eval Low Complexity: 1 Low G-Codes:       Hortencia Pilar 06/08/2017, 3:52 PM

## 2017-06-08 NOTE — Consult Note (Signed)
[facility] Consult: 1:22 PM 06/08/2017  LOS: 1 day    Referring Provider: Dr. Quincy Simmonds Primary Care Physician:  Debbrah Alar, NP Primary Gastroenterologist:  Dr. Ardis Hughs     Reason for Consultation: Rectal bleeding and anemia   HPI: Willie [name] is a 42 y.o. male.  Hx Insulin-requiring type 2 diabetes mellitus.  Recurrent alcoholic pancreatitis dating back to at least 2010.  Chronic calcific pancreatitis   09/2010 colonoscopy at [facility] for minor, remittent rectal bleeding.  Dr. Ardis Hughs.  Revealed mild sigmoid to descending diverticulosis.  Mild left colon erythema, chronic inflammation versus "prep effect", biopsy of the area proved benign. Normal terminal ileum. Serum H. pylori antigen negative in 02/2015.  His last CT abdomen and pelvis with contrast was on 03/26/17.  This showed hepatic steatosis, hepatomegaly.  Stable liver hemangioma and stable subcentimeter liver cyst.  Small HH.  Atrophic, calcific chronic pancreatitis.  Patient admitted yesterday after awakening in the morning and experiencing incoordination and weakness of his left upper extremity.  Him to him only present after he sat or stood up, not present when laying down.  He also described severe, stabbing chest pain, not like anything he had before. Patient vague as to alcohol intake but has been drinking in recent days. In the ED he was tachycardic to the 130s and mildly hypertensive.  Neurology felt the patient had left upper extremity ataxia.  CTA and MRA of the head and neck nonacute, no thrombolysis was recommended.  Hgb generally runs around 15, it had dropped to 12.5 at the end of 02/2017.  15.2 yesterday, 12.8 today.  MCV normal.  Platelets and WBCs normal.  Yesterday's creatinine was elevated at 1.3, it is normal today.  BUN has not  been elevated.  AST 66, ALT 53.  Total bilirubin, alkaline phosphatase normal.  Lipase acid troponins negative x 2.  PT/INR normal.  Hemoglobin A1c elevated at 8.4 (10.7 in 02/2017).  Urine tox screen negative.  ETOH level not assayed.   For about 1 week patient's been passing small to moderate amounts of blood 2 or 3 times a day.  These occur mostly when he has a bowel movement, but he has seen blood on his underwear even when he did not have a bowel movement.  BMs are brown and run the gamut from hard to soft, to loose.  No rectal pain or pressure.  Does not have a sense that he has hemorrhoids.  The bleeding is similar to what he had back in 2012 but it had not recurred until last week.  Appetite is poor.  Has not had nausea or vomiting although yesterday, after he been n.p.o. for many hours, he had clear/bilious emesis.  Today he ate his entire solid, carb mod diet and is having no problems.  He has some abdominal discomfort when he is getting ready to have a bowel movement, this is been present for a few weeks.  The discomfort subsides once he evacuated his bowels.  Seen by Dr Ardis Hughs in office 01/2016 for bouts of n/v, loose/frequent stools, intermittent  rectal bleeding, elevated LFTs.  Dr Ardis Hughs suspected that his binges of ETOH were a significant contributor to his sxs.  He did not feel colon/flex sig was needed and suggested adding Imodium and completed ETOH abstinence.  Wt that day was 215#, 216# in 02/2017.    No family history of colon cancer.  He had a twin who died of cancer that started in the lymph nodes and metastasized. Evasive as to how much she is drinking, he says he is drinking less, would not specify when he last drink but admitted to drinking "3 beers" last week.    Past Medical History:  Diagnosis Date  . Alcoholic pancreatitis    recurrent  . Depression   . Diabetes mellitus, type II ([place])    New Onset 03/2010  . GERD (gastroesophageal reflux disease)   . High cholesterol    . 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 Hypertension   . Sleep apnea    does not wear a CPAP    Past Surgical History:  Procedure Laterality Date  . LUMBAR MICRODISCECTOMY  ~ 2004   Dr Ellene Route    Prior to Admission medications   Medication Sig Start Date End Date Taking? Authorizing Provider  amLODipine (NORVASC) 10 MG tablet Take 1 tablet (10 mg total) by mouth daily. 02/27/17  Yes Debbrah Alar, NP  atorvastatin (LIPITOR) 20 MG tablet Take 1 tablet (20 mg total) by mouth daily. 02/27/17  Yes Debbrah Alar, NP  cloNIDine (CATAPRES) 0.1 MG tablet Take 1 tablet (0.1 mg total) by mouth 3 (three) times daily. 02/27/17  Yes Debbrah Alar, NP  escitalopram (LEXAPRO) 20 MG tablet Take 1 tablet (20 mg total) by mouth daily. 02/27/17  Yes Debbrah Alar, NP  hydrOXYzine (ATARAX/VISTARIL) 50 MG tablet Take 1 tablet (50 mg total) by mouth 3 (three) times daily as needed. Patient taking differently: Take 50 mg by mouth 3 (three) times daily as needed for anxiety.  02/27/17  Yes Debbrah Alar, NP  Insulin Glargine (LANTUS SOLOSTAR) 100 UNIT/ML Solostar Pen Inject 40 Units into the skin daily at 10 pm. Patient taking differently: Inject 35 Units into the skin at bedtime.  02/27/17  Yes Debbrah Alar, NP  insulin lispro (HUMALOG KWIKPEN) 100 UNIT/ML KiwkPen 3 times a day (just before each meal) 09-10-08 units, Patient taking differently: Inject 5-15 Units into the skin See admin instructions. 15 units into the skin with breakfast then 5 units with lunch then 15 units with dinner (evening meal) 02/27/17  Yes Debbrah Alar, NP  lisinopril (PRINIVIL,ZESTRIL) 20 MG tablet TAKE 1 TABLET (20 MG TOTAL) BY MOUTH DAILY. Patient taking differently: Take 20 mg by mouth daily.  02/27/17  Yes Debbrah Alar, NP  Multiple Vitamins-Minerals (MULTIVITAMIN) tablet Take 1 tablet by mouth daily. 02/12/16  Yes Palumbo, April, MD    ondansetron (ZOFRAN ODT) 8 MG disintegrating tablet Take 1 tablet (8 mg total) by mouth every 8 (eight) hours as needed for nausea or vomiting. 03/27/17  Yes Barton Dubois, MD  pantoprazole (PROTONIX) 40 MG tablet Take 1 tablet (40 mg total) by mouth 2 (two) times daily. 03/27/17  Yes Barton Dubois, MD  potassium chloride (K-DUR,KLOR-CON) 10 MEQ tablet Take 2 tablets (20 mEq total) by mouth daily. 02/27/17  Yes Debbrah Alar, NP  QUEtiapine (SEROQUEL) 300 MG tablet Take 1 tablet (300 mg total) by mouth at bedtime. 02/27/17  Yes Debbrah Alar, NP  thiamine 100 MG tablet Take  1 tablet (100 mg total) by mouth daily. 02/27/17  Yes Debbrah Alar, NP  triamcinolone cream (KENALOG) 0.1 % Apply 1 application topically 2 (two) times daily. Patient taking differently: Apply 1 application topically 2 (two) times daily as needed (for itching).  02/27/17  Yes Debbrah Alar, NP  zolpidem (AMBIEN) 5 MG tablet Take 1 tablet (5 mg total) by mouth at bedtime as needed. for sleep Patient taking differently: Take 5 mg by mouth at bedtime. for sleep 02/27/17  Yes Debbrah Alar, NP  Blood Glucose Monitoring Suppl (FREESTYLE FREEDOM LITE) w/Device KIT 2 (two) times daily 02/27/17   Debbrah Alar, NP  glucose blood (FREESTYLE LITE) test strip Use as instructed 02/27/17   Debbrah Alar, NP  Insulin Pen Needle (ULTICARE SHORT PEN NEEDLES) 31G X 8 MM MISC Use once daily to inject insulin. 02/27/17   Debbrah Alar, NP  Lancets (FREESTYLE) lancets 2 (two) times daily 02/27/17   Debbrah Alar, NP  methocarbamol (ROBAXIN) 500 MG tablet Take 1 tablet (500 mg total) by mouth every 8 (eight) hours as needed for muscle spasms. Patient not taking: Reported on 06/07/2017 03/27/17   Barton Dubois, MD  sucralfate (CARAFATE) 1 GM/10ML suspension Take 10 mLs (1 g total) by mouth 3 (three) times daily. Patient not taking: Reported on 06/07/2017 03/27/17 03/27/18  Barton Dubois, MD  tadalafil  (CIALIS) 10 MG tablet Take 1 tablet (10 mg total) by mouth daily as needed for erectile dysfunction. Patient not taking: Reported on 06/07/2017 06/25/16   Debbrah Alar, NP  valACYclovir (VALTREX) 500 MG tablet Take 1 tablet (500 mg total) by mouth daily. Patient not taking: Reported on 06/07/2017 02/27/17   Debbrah Alar, NP    Scheduled Meds: . amLODipine  10 mg Oral Daily  . atorvastatin  20 mg Oral Daily  . cloNIDine  0.1 mg Oral TID  . escitalopram  20 mg Oral Daily  . folic acid  1 mg Oral Daily  . insulin aspart  0-15 Units Subcutaneous Q6H  . insulin glargine  20 Units Subcutaneous QHS  . lipase/protease/amylase  36,000 Units Oral TID WC  . lisinopril  20 mg Oral Daily  . multivitamin with minerals  1 tablet Oral Daily  . pantoprazole (PROTONIX) IV  40 mg Intravenous Q24H  . QUEtiapine  300 mg Oral QHS  . thiamine  100 mg Oral Daily   Or  . thiamine  100 mg Intravenous Daily   Infusions:  PRN Meds: acetaminophen **OR** acetaminophen (TYLENOL) oral liquid 160 mg/5 mL **OR** acetaminophen, LORazepam **OR** LORazepam, ondansetron, senna-docusate   Allergies as of 06/07/2017 - Review Complete 06/07/2017  Allergen Reaction Noted  . Invokamet [canagliflozin-metformin hcl] Other (See Comments) 11/18/2014  . Metformin and related Other (See Comments) 12/28/2014    Family History  Problem Relation Age of Onset  . Diabetes Mother   . Lung cancer Brother        twin brother  . Pancreatic cancer Paternal Aunt   . Colon cancer Neg Hx   . Stomach cancer Neg Hx     Social History   Socioeconomic History  . Marital status: Married    Spouse name: Not on file  . Number of children: 2  . Years of education: Not on file  . Highest education level: Not on file  Social Needs  . Financial resource strain: Not on file  . Food insecurity - worry: Not on file  . Food insecurity - inability: Not on file  . Transportation needs - medical: Not  on file  . Transportation  needs - non-medical: Not on file  Occupational History  . Occupation: Landscape architect: [employer]  Tobacco Use  . Smoking status: Current Every Day Smoker    Packs/day: 0.25    Years: 1.00    Pack years: 0.25    Types: Cigarettes    Start date: 04/29/2016  . Smokeless tobacco: Never Used  . Tobacco comment: 4 Cigarettes a day   Substance and Sexual Activity  . Alcohol use: Yes  . Drug use: No    Comment: none in years  . Sexual activity: Not on file  Other Topics Concern  . 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 ENT:  No nose bleeds Pulm: No dyspnea.  No cough. CV:  No palpitations, no LE edema.  GU: He has urinary frequency and sense of incomplete voiding at times. GI:  See HPI Heme: Other than the rectal bleeding he has not seen any other unusual.  History of anemia or needing B12, folate or iron supplements. Transfusions: Not recall previous transfusions. Neuro: No seizures.  The left upper extremity weakness has resolved.  PT evaluation from this morning reveals that during hallway walking he had brief scissoring gait and complained of lightheadedness which resolved. Derm:  No itching, no rash or sores.  Endocrine:  No sweats, no excessive thirst. Immunization: Reviewed.  Received flu vaccination 02/2017.  Has received pneumococcal vaccination and up-to-date on Tdap as of 2014 Travel:  None beyond local counties in last few months.    PHYSICAL EXAM: Vital signs in last 24 hours: Vitals:   06/08/17 0551 06/08/17 1041  BP: 133/89 122/86  Pulse: 96 (!) 106  Resp: 18 16  Temp: 98.9 F (37.2 C) 98.4 F (36.9 C)  SpO2: 100% 99%   Wt Readings from Last 3 Encounters:  03/26/17 98.3 kg (216 lb  11.2 oz)  02/27/17 102.6 kg (226 lb 3.2 oz)  01/27/17 95.8 kg (211 lb 3.2 oz)    General: Non-ill appearing, comfortable WM Head: No facial asymmetry or swelling.  No signs of head trauma. Eyes: No scleral icterus.  No conjunctival pallor.  EOMI. Ears: Not hard of hearing. Nose: Congestion or discharge. Mouth: Oral mucosa moist, pink, clear.  Tongue midline.  Fair dentition. Neck: No JVD, no thyromegaly, no masses, no bruits. Lungs: Clear bilaterally.  No cough or labored breathing. Heart: RRR.  No MRG.  S1, S2 present. Abdomen: Soft.  Nonobese.  Not tender or distended.  Bowel sounds active.  No appreciable HSM, masses, bruits, hernias..   Rectal: No visible blood.  No visible tears or hemorrhoids.  Firm enlarged palpable columns in the ventral aspect of the rectum, these are not tender.  They are very low so suspect these are hemorrhoids and not prostamegaly. Musc/Skeltl: No joint redness, swelling or deformities Extremities: No CCE. Neurologic: Oriented x3.  Moves all 4 limbs, strength not tested.  No tremor.  No gross deficits. Skin: Skin on his thigh and in the broad perirectal buttock region is hyperpigmented.  Some of this is brought hyper pigmentation, and other areas it is isolated, annular pattern.  Patient tells me this is been there for a while and that as a child he used to scratch a lot and developed rashes and sores. Psych: Pleasant, cooperative.  Follows conversation and commands.  Intake/Output from previous day: 02/09 0701 - 02/10 0700 In: 793.8 [I.V.:693.8; IV Piggyback:100] Out: -  Intake/Output this shift: No intake/output data recorded.  LAB RESULTS: Recent Labs    06/07/17 1044 06/08/17 0509  WBC 8.9 6.7  HGB 15.2 12.8*  HCT 42.6 38.4*  PLT 355 267   BMET Lab Results  Component Value Date   NA 136 06/08/2017   NA 137 06/07/2017   NA 138 03/27/2017   K 3.8 06/08/2017   K 3.9 06/07/2017   K 3.0 (L) 03/27/2017   CL 101 06/08/2017   CL 100 (L)  06/07/2017   CL 105 03/27/2017   CO2 23 06/08/2017   CO2 24 06/07/2017   CO2 27 03/27/2017   GLUCOSE 80 06/08/2017   GLUCOSE 88 06/07/2017   GLUCOSE 135 (H) 03/27/2017   BUN 9 06/08/2017   BUN 16 06/07/2017   BUN 9 03/27/2017   CREATININE 0.96 06/08/2017   CREATININE 1.33 (H) 06/07/2017   CREATININE 0.75 03/27/2017   CALCIUM 8.7 (L) 06/08/2017   CALCIUM 8.7 (L) 06/07/2017   CALCIUM 7.8 (L) 03/27/2017   LFT Recent Labs    06/07/17 1044  PROT 7.8  ALBUMIN 3.7  AST 66*  ALT 53  ALKPHOS 107  BILITOT 0.9   PT/INR Lab Results  Component Value Date   INR 1.01 06/07/2017   INR 1.0 02/26/2016   INR 1.16 03/23/2009   Hepatitis Panel No results for input(s): HEPBSAG, HCVAB, HEPAIGM, HEPBIGM in the last 72 hours. C-Diff No components found for: CDIFF Lipase     Component Value Date/Time   LIPASE 14 03/26/2017 1710    Drugs of Abuse     Component Value Date/Time   LABOPIA NONE DETECTED 03/26/2017 1917   COCAINSCRNUR NONE DETECTED 03/26/2017 1917   LABBENZ NONE DETECTED 03/26/2017 1917   AMPHETMU NONE DETECTED 03/26/2017 1917   THCU NONE DETECTED 03/26/2017 1917   LABBARB NONE DETECTED 03/26/2017 1917     RADIOLOGY STUDIES: Ct Angio Head W Or Wo Contrast  Result Date: 06/07/2017 CLINICAL DATA:  Patient woke with left arm weakness and numbness. EXAM: CT ANGIOGRAPHY HEAD AND NECK TECHNIQUE: Multidetector CT imaging of the head and neck was performed using the standard protocol during bolus administration of intravenous contrast. Multiplanar CT image reconstructions and MIPs were obtained to evaluate the vascular anatomy. Carotid stenosis measurements (when applicable) are obtained utilizing NASCET criteria, using the distal internal carotid diameter as the denominator. CONTRAST:  169m ISOVUE-370 IOPAMIDOL (ISOVUE-370) INJECTION 76% COMPARISON:  CTA of the neck 07/21/2014 FINDINGS: CTA NECK FINDINGS Aortic arch: Normal appearance.  Four vessel branching. Right carotid  system: Mild tortuosity with partial retropharyngeal course. No atheromatous changes, beading, or stenosis. Left carotid system: Mild tortuosity with partial retropharyngeal course. No beading, stenosis, or atheromatous change. Vertebral arteries: No proximal subclavian stenosis. The left vertebral artery arises from the arch. Both vertebral arteries are smooth and diffusely patent. No atheromatous changes. Skeleton: No acute or aggressive finding. Other neck: No incidental mass or inflammation. Upper chest: Negative Review of the MIP images confirms the above findings CTA HEAD FINDINGS Diminished detail due to contrast timing and arterial density. Anterior circulation: No atheromatous changes, stenosis, occlusion, or aneurysm. Posterior circulation: Codominant vertebral arteries. Bilateral picas patent. Vertebral and basilar arteries are smooth and widely  patent. Symmetric robust PCA flow. Venous sinuses: Patent Anatomic variants: None significant Delayed phase: No abnormal intracranial enhancement. Review of the MIP images confirms the above findings IMPRESSION: Negative CTA of the head and neck. Electronically Signed   By: Monte Fantasia M.D.   On: 06/07/2017 11:31   Dg Chest 2 View  Result Date: 06/07/2017 CLINICAL DATA:  LEFT arm weakness, dry heaves, type II diabetes mellitus, GERD, hypertension EXAM: CHEST  2 VIEW COMPARISON:  03/26/2017 FINDINGS: Upper normal heart size. Mediastinal contours and pulmonary vascularity normal. Lungs clear. No pleural effusion or pneumothorax. Bones unremarkable. IMPRESSION: No acute abnormalities. Electronically Signed   By: Lavonia Dana M.D.   On: 06/07/2017 20:05   Ct Angio Neck W And/or Wo Contrast  Result Date: 06/07/2017 CLINICAL DATA:  Patient woke with left arm weakness and numbness. EXAM: CT ANGIOGRAPHY HEAD AND NECK TECHNIQUE: Multidetector CT imaging of the head and neck was performed using the standard protocol during bolus administration of intravenous  contrast. Multiplanar CT image reconstructions and MIPs were obtained to evaluate the vascular anatomy. Carotid stenosis measurements (when applicable) are obtained utilizing NASCET criteria, using the distal internal carotid diameter as the denominator. CONTRAST:  195m ISOVUE-370 IOPAMIDOL (ISOVUE-370) INJECTION 76% COMPARISON:  CTA of the neck 07/21/2014 FINDINGS: CTA NECK FINDINGS Aortic arch: Normal appearance.  Four vessel branching. Right carotid system: Mild tortuosity with partial retropharyngeal course. No atheromatous changes, beading, or stenosis. Left carotid system: Mild tortuosity with partial retropharyngeal course. No beading, stenosis, or atheromatous change. Vertebral arteries: No proximal subclavian stenosis. The left vertebral artery arises from the arch. Both vertebral arteries are smooth and diffusely patent. No atheromatous changes. Skeleton: No acute or aggressive finding. Other neck: No incidental mass or inflammation. Upper chest: Negative Review of the MIP images confirms the above findings CTA HEAD FINDINGS Diminished detail due to contrast timing and arterial density. Anterior circulation: No atheromatous changes, stenosis, occlusion, or aneurysm. Posterior circulation: Codominant vertebral arteries. Bilateral picas patent. Vertebral and basilar arteries are smooth and widely patent. Symmetric robust PCA flow. Venous sinuses: Patent Anatomic variants: None significant Delayed phase: No abnormal intracranial enhancement. Review of the MIP images confirms the above findings IMPRESSION: Negative CTA of the head and neck. Electronically Signed   By: JMonte FantasiaM.D.   On: 06/07/2017 11:31   Mr Brain Wo Contrast  Result Date: 06/07/2017 CLINICAL DATA:  42y/o  M; left arm weakness and numbness. EXAM: MRI HEAD WITHOUT CONTRAST MRA HEAD WITHOUT CONTRAST TECHNIQUE: Multiplanar, multiecho pulse sequences of the brain and surrounding structures were obtained without intravenous contrast.  Angiographic images of the head were obtained using MRA technique without contrast. COMPARISON:  06/07/2017 CT head and CTA head. FINDINGS: MRI HEAD FINDINGS Brain: No acute infarction, hemorrhage, hydrocephalus, extra-axial collection or mass lesion. No structural or significant signal abnormality of the brain. Vascular: As below. Skull and upper cervical spine: Normal marrow signal. Sinuses/Orbits: Mild sphenoid and left maxillary sinus mucosal thickening. No abnormal signal of mastoid air cells. Orbits are unremarkable. Other: None. MRA HEAD FINDINGS Internal carotid arteries:  Patent. Anterior cerebral arteries:  Patent. Middle cerebral arteries: Patent. Anterior communicating artery: Patent. Posterior communicating arteries:  Patent. Posterior cerebral arteries:  Patent. Basilar artery:  Patent. Vertebral arteries:  Patent. No evidence of high-grade stenosis, large vessel occlusion, or aneurysm. IMPRESSION: 1. Normal MRI of the brain. 2. Normal MRA of the head. Electronically Signed   By: LKristine GarbeM.D.   On: 06/07/2017 23:30   Mr MJodene Nam  Head Wo Contrast  Result Date: 06/07/2017 CLINICAL DATA:  42 y/o  M; left arm weakness and numbness. EXAM: MRI HEAD WITHOUT CONTRAST MRA HEAD WITHOUT CONTRAST TECHNIQUE: Multiplanar, multiecho pulse sequences of the brain and surrounding structures were obtained without intravenous contrast. Angiographic images of the head were obtained using MRA technique without contrast. COMPARISON:  06/07/2017 CT head and CTA head. FINDINGS: MRI HEAD FINDINGS Brain: No acute infarction, hemorrhage, hydrocephalus, extra-axial collection or mass lesion. No structural or significant signal abnormality of the brain. Vascular: As below. Skull and upper cervical spine: Normal marrow signal. Sinuses/Orbits: Mild sphenoid and left maxillary sinus mucosal thickening. No abnormal signal of mastoid air cells. Orbits are unremarkable. Other: None. MRA HEAD FINDINGS Internal carotid  arteries:  Patent. Anterior cerebral arteries:  Patent. Middle cerebral arteries: Patent. Anterior communicating artery: Patent. Posterior communicating arteries:  Patent. Posterior cerebral arteries:  Patent. Basilar artery:  Patent. Vertebral arteries:  Patent. No evidence of high-grade stenosis, large vessel occlusion, or aneurysm. IMPRESSION: 1. Normal MRI of the brain. 2. Normal MRA of the head. Electronically Signed   By: Kristine Garbe M.D.   On: 06/07/2017 23:30   Ct Head Code Stroke Wo Contrast  Result Date: 06/07/2017 CLINICAL DATA:  Code stroke. Left arm numbness and weakness since awakening. EXAM: CT HEAD WITHOUT CONTRAST TECHNIQUE: Contiguous axial images were obtained from the base of the skull through the vertex without intravenous contrast. COMPARISON:  12/24/2014 FINDINGS: Brain: No evidence of acute infarction, hemorrhage, hydrocephalus, extra-axial collection or mass lesion/mass effect. Vascular: No hyperdense vessel or unexpected calcification. Skull: Normal. Negative for fracture or focal lesion. Sinuses/Orbits: No acute finding. Other: These results were called by telephone at the time of interpretation on 06/07/2017 at 11:01 am to Dr. Orlie Dakin , who verbally acknowledged these results. ASPECTS [facility] Stroke Program Early CT Score) - Ganglionic level infarction (caudate, lentiform nuclei, internal capsule, insula, M1-M3 cortex): 7 - Supraganglionic infarction (M4-M6 cortex): 3 Total score (0-10 with 10 being normal): 10 IMPRESSION: Negative head CT.  ASPECTS is 10. Electronically Signed   By: Monte Fantasia M.D.   On: 06/07/2017 11:02     IMPRESSION:   *   Anemia.  Noncritical.  Normal MCV.  2 plus gm drop in Hgb.  Cannot attribute this to aggressive fluid resuscitation as he is only been receiving normal saline at 75 an hour, this ended at 730 this morning.  Current level of 12.8 is actually improved from level of 12.5 on 03/27/17.  *   Chronic calcific  pancreatitis, not symptomatic unless some of his loose stools are the result of pancreatic insufficiency.  Long previous history of acute alcoholic pancreatitis dating back at least a decade.   *    Hepatic steatosis and hepatomegaly, stable liver hemangioma and cyst based on CT findings 02/2017.  Mild elevation of AST c/w ETOH pattern.  Still actively drinking.  *   Poorly controlled insulin requiring type 2 DM.    *  Left UE weakness, resolved.  Negative brain imaging.      PLAN:     *   Patient does not need CT scan of the abdomen pelvis at this point.  I canceled the order.  *   Does not need IV Protonix.  I resumed oral Protonix.  *   Adding hydrocortisone suppositories.    *   Complete and total alcohol abstinence.  *   AM CBC, folate, B12 level, anemia panel.  Azucena Freed  06/08/2017, 1:22 PM Pager: [phone]

## 2017-06-09 ENCOUNTER — Inpatient Hospital Stay (HOSPITAL_COMMUNITY): Payer: No Typology Code available for payment source

## 2017-06-09 DIAGNOSIS — K648 Other hemorrhoids: Secondary | ICD-10-CM

## 2017-06-09 DIAGNOSIS — K860 Alcohol-induced chronic pancreatitis: Secondary | ICD-10-CM

## 2017-06-09 DIAGNOSIS — F418 Other specified anxiety disorders: Secondary | ICD-10-CM

## 2017-06-09 DIAGNOSIS — I10 Essential (primary) hypertension: Secondary | ICD-10-CM

## 2017-06-09 DIAGNOSIS — E1165 Type 2 diabetes mellitus with hyperglycemia: Secondary | ICD-10-CM

## 2017-06-09 DIAGNOSIS — D638 Anemia in other chronic diseases classified elsewhere: Secondary | ICD-10-CM

## 2017-06-09 DIAGNOSIS — F1010 Alcohol abuse, uncomplicated: Secondary | ICD-10-CM

## 2017-06-09 LAB — IRON AND TIBC
IRON: 120 ug/dL (ref 45–182)
Saturation Ratios: 49 % — ABNORMAL HIGH (ref 17.9–39.5)
TIBC: 244 ug/dL — ABNORMAL LOW (ref 250–450)
UIBC: 124 ug/dL

## 2017-06-09 LAB — GLUCOSE, CAPILLARY
Glucose-Capillary: 320 mg/dL — ABNORMAL HIGH (ref 65–99)
Glucose-Capillary: 215 mg/dL — ABNORMAL HIGH (ref 65–99)
Glucose-Capillary: 252 mg/dL — ABNORMAL HIGH (ref 65–99)
Glucose-Capillary: 217 mg/dL — ABNORMAL HIGH (ref 65–99)
Glucose-Capillary: 256 mg/dL — ABNORMAL HIGH (ref 65–99)

## 2017-06-09 LAB — CBC
HCT: 32.8 % — ABNORMAL LOW (ref 39.0–52.0)
Hemoglobin: 11.1 g/dL — ABNORMAL LOW (ref 13.0–17.0)
MCH: 29.8 pg (ref 26.0–34.0)
MCHC: 33.8 g/dL (ref 30.0–36.0)
MCV: 87.9 fL (ref 78.0–100.0)
Platelets: 241 10*3/uL (ref 150–400)
RBC: 3.73 MIL/uL — ABNORMAL LOW (ref 4.22–5.81)
RDW: 13.5 % (ref 11.5–15.5)
WBC: 4.5 10*3/uL (ref 4.0–10.5)

## 2017-06-09 LAB — FERRITIN: Ferritin: 527 ng/mL — ABNORMAL HIGH (ref 24–336)

## 2017-06-09 LAB — COMPREHENSIVE METABOLIC PANEL
ALT: 63 U/L (ref 17–63)
ANION GAP: 11 (ref 5–15)
AST: 113 U/L — AB (ref 15–41)
Albumin: 2.8 g/dL — ABNORMAL LOW (ref 3.5–5.0)
Alkaline Phosphatase: 101 U/L (ref 38–126)
CHLORIDE: 104 mmol/L (ref 101–111)
CO2: 21 mmol/L — ABNORMAL LOW (ref 22–32)
Calcium: 8.3 mg/dL — ABNORMAL LOW (ref 8.9–10.3)
Creatinine, Ser: 1 mg/dL (ref 0.61–1.24)
GFR calc Af Amer: >60 mL/min (ref >60)
Glucose, Bld: 338 mg/dL — ABNORMAL HIGH (ref 65–99)
POTASSIUM: 3.7 mmol/L (ref 3.5–5.1)
Sodium: 136 mmol/L (ref 135–145)
Total Bilirubin: 1.2 mg/dL (ref 0.3–1.2)
Total Protein: 5.5 g/dL — ABNORMAL LOW (ref 6.5–8.1)

## 2017-06-09 LAB — FOLATE: FOLATE: 29.4 ng/mL (ref >5.9)

## 2017-06-09 LAB — FOLATE RBC
FOLATE, HEMOLYSATE: 435.6 ng/mL
FOLATE, RBC: 1106 ng/mL (ref >498)
HEMATOCRIT: 39.4 % (ref 37.5–51.0)

## 2017-06-09 LAB — RETICULOCYTES
RBC.: 3.73 MIL/uL — AB (ref 4.22–5.81)
Retic Count, Absolute: 26.1 10*3/uL (ref 19.0–186.0)
Retic Ct Pct: 0.7 % (ref 0.4–3.1)

## 2017-06-09 LAB — AMMONIA: AMMONIA: 30 umol/L (ref 9–35)

## 2017-06-09 LAB — MAGNESIUM: MAGNESIUM: 1.6 mg/dL — AB (ref 1.7–2.4)

## 2017-06-09 MED ORDER — INSULIN ASPART 100 UNIT/ML ~~LOC~~ SOLN
0.0000 [IU] | Freq: Three times a day (TID) | SUBCUTANEOUS | Status: DC
  Administered 2017-06-09: 5 [IU] via SUBCUTANEOUS
  Administered 2017-06-09: 8 [IU] via SUBCUTANEOUS
  Administered 2017-06-10: 2 [IU] via SUBCUTANEOUS

## 2017-06-09 MED ORDER — OXYCODONE HCL 5 MG PO TABS
5.0000 mg | ORAL_TABLET | Freq: Once | ORAL | Status: AC | PRN
  Administered 2017-06-09: 5 mg via ORAL
  Filled 2017-06-09: qty 1

## 2017-06-09 MED ORDER — LORAZEPAM 2 MG/ML IJ SOLN
1.0000 mg | Freq: Once | INTRAMUSCULAR | Status: AC
  Administered 2017-06-09: 1 mg via INTRAVENOUS
  Filled 2017-06-09: qty 1

## 2017-06-09 MED ORDER — INSULIN ASPART 100 UNIT/ML ~~LOC~~ SOLN
8.0000 [IU] | Freq: Once | SUBCUTANEOUS | Status: AC
  Administered 2017-06-09: 8 [IU] via SUBCUTANEOUS

## 2017-06-09 MED ORDER — MAGNESIUM SULFATE 2 GM/50ML IV SOLN
2.0000 g | Freq: Once | INTRAVENOUS | Status: AC
  Administered 2017-06-09: 2 g via INTRAVENOUS
  Filled 2017-06-09: qty 50

## 2017-06-09 MED ORDER — INSULIN GLARGINE 100 UNIT/ML ~~LOC~~ SOLN
35.0000 [IU] | Freq: Every day | SUBCUTANEOUS | Status: DC
  Administered 2017-06-09: 35 [IU] via SUBCUTANEOUS
  Filled 2017-06-09 (×2): qty 0.35

## 2017-06-09 MED ORDER — GABAPENTIN 100 MG PO CAPS
100.0000 mg | ORAL_CAPSULE | Freq: Once | ORAL | Status: AC
  Administered 2017-06-09: 100 mg via ORAL
  Filled 2017-06-09: qty 1

## 2017-06-09 MED ORDER — KETOROLAC TROMETHAMINE 15 MG/ML IJ SOLN
15.0000 mg | Freq: Once | INTRAMUSCULAR | Status: AC
  Administered 2017-06-09: 15 mg via INTRAVENOUS
  Filled 2017-06-09: qty 1

## 2017-06-09 NOTE — Progress Notes (Addendum)
Inpatient Diabetes Program Recommendations  AACE/ADA: New Consensus Statement on Inpatient Glycemic Control (2015)  Target Ranges:  Prepandial:   less than 140 mg/dL      Peak postprandial:   less than 180 mg/dL (1-2 hours)      Critically ill patients:  140 - 180 mg/dL   Results for Willie [name], Willie [name] (MRN [id number]) as of 06/09/2017 12:16  Ref. Range 06/08/2017 18:15 06/08/2017 20:10 06/09/2017 01:47 06/09/2017 08:28  Glucose-Capillary Latest Ref Range: 65 - 99 mg/dL 366 (H)  15 units Novolog 227 (H)  20 units Lantus 320 (H)  11 units Novolog 215 (H)  5 units Novolog    Admit with: Left upper extremity ataxia  History: DM/ ETOH Abuse  Home DM Meds: Lantus 35 units QHS       Humalog 15 units breakfast/ 5 units lunch/ 15 units dinner  Current Insulin Orders: Lantus 20 units QHS      Novolog Moderate Correction Scale/ SSI (0-15 units) Q6 hours      MD- Please consider the following in-hospital insulin adjustments:  1. Increase Lantus to 25 units QHS  2. Start Novolog Meal Coverage: Novolog 5 units TID with meals (hold if pt eats <50% of meal)  3. Change Novolog SSI to TID AC + HS (currently ordered Q6 hours)     --Will follow patient during hospitalization--  Wyn Quaker RN, MSN, CDE Diabetes Coordinator Inpatient Glycemic Control Team Team Pager: [phone] (8a-5p)

## 2017-06-09 NOTE — Progress Notes (Signed)
Pt c/o burning and "hot pain" throughout left arm; rates pain 8/10. Pt demonstrates good ROM, on call physician paged. Awaiting word back.

## 2017-06-09 NOTE — Progress Notes (Signed)
Physical Therapy Treatment Patient Details Name: Willie [name] MRN: [id number] DOB: 1976/04/19 Today's Date: 06/09/2017    History of Present Illness 42 year old man with prior history of alcohol abuse, chronic pancreatitis on imaging CT scan 2018, current smoker, insulin-dependent diabetes, who presented with left upper extremity weakness. MRI negative.     PT Comments    Patient continues to demonstrate balance deficits at times however no LOB and able to self correct. Pt demonstrated increased gait deviations when performing head turns that were cued versus not cued. Pt scored 18/24 on DGI. Pt supervision/min guard for ambulation and stairs and c/o lightheadedness. PT will continue to follow acutely.   Follow Up Recommendations  No PT follow up     Equipment Recommendations  None recommended by PT    Recommendations for Other Services       Precautions / Restrictions Precautions Precautions: None Restrictions Weight Bearing Restrictions: No    Mobility  Bed Mobility Overal bed mobility: Independent                Transfers Overall transfer level: Independent Equipment used: None                Ambulation/Gait Ambulation/Gait assistance: Min guard;Supervision Ambulation Distance (Feet): 300 Feet Assistive device: None Gait Pattern/deviations: Step-through pattern Gait velocity: decreased   General Gait Details: pt demonstrated unsteadiness scissoring gait at times however no LOB   Stairs Stairs: Yes   Stair Management: One rail Right;Alternating pattern;Step to pattern Number of Stairs: (3 steps X2) General stair comments: cues for step to pattern for safety; supervision to ascend with rail and min guard to descend with rail; pt unsteady when descending and c/o increased lightheadedness   Wheelchair Mobility    Modified Rankin (Stroke Patients Only)       Balance Overall balance assessment: Modified Independent Sitting-balance support:  Feet supported;No upper extremity supported Sitting balance-Leahy Scale: Normal     Standing balance support: No upper extremity supported;During functional activity Standing balance-Leahy Scale: Good                   Standardized Balance Assessment Standardized Balance Assessment : Dynamic Gait Index   Dynamic Gait Index Level Surface: Mild Impairment Change in Gait Speed: Mild Impairment Gait with Horizontal Head Turns: Mild Impairment Gait with Vertical Head Turns: Mild Impairment Gait and Pivot Turn: Normal Step Over Obstacle: Normal Step Around Obstacles: Mild Impairment Steps: Mild Impairment Total Score: 18      Cognition Arousal/Alertness: Awake/alert Behavior During Therapy: WFL for tasks assessed/performed Overall Cognitive Status: Within Functional Limits for tasks assessed                                        Exercises      General Comments        Pertinent Vitals/Pain Pain Assessment: Faces Faces Pain Scale: Hurts a little bit Pain Location: L shoulder with flexion Pain Descriptors / Indicators: Grimacing;Guarding;Sore Pain Intervention(s): Monitored during session    Home Living                      Prior Function            PT Goals (current goals can now be found in the care plan section) Acute Rehab PT Goals PT Goal Formulation: With patient Time For Goal Achievement: 06/22/17 Potential to Achieve Goals: Good Progress  towards PT goals: Progressing toward goals    Frequency    Min 3X/week      PT Plan Current plan remains appropriate    Co-evaluation              AM-PAC PT "6 Clicks" Daily Activity  Outcome Measure  Difficulty turning over in bed (including adjusting bedclothes, sheets and blankets)?: None Difficulty moving from lying on back to sitting on the side of the bed? : None Difficulty sitting down on and standing up from a chair with arms (e.g., wheelchair, bedside commode,  etc,.)?: None Help needed moving to and from a bed to chair (including a wheelchair)?: None Help needed walking in hospital room?: None Help needed climbing 3-5 steps with a railing? : A Little 6 Click Score: 23    End of Session Equipment Utilized During Treatment: Gait belt Activity Tolerance: Patient tolerated treatment well Patient left: in bed;with call bell/phone within reach;with family/visitor present Nurse Communication: Mobility status PT Visit Diagnosis: Unsteadiness on feet (R26.81)     Time: 9407-6808 PT Time Calculation (min) (ACUTE ONLY): 14 min  Charges:  $Gait Training: 8-22 mins                    G Codes:       Earney Navy, PTA Pager: [phone]     Darliss Cheney 06/09/2017, 4:35 PM

## 2017-06-09 NOTE — Progress Notes (Signed)
PROGRESS NOTE [facility]   Willie [name]   GYI:[id number] DOB: October 21, 1975  DOA: 06/07/2017 PCP: Debbrah Alar, NP   Brief Narrative:  Willie [name] is a 42 year old male with history of alcohol abuse, hypertension, diabetes type 2, chronic pancreatitis, who presented to the emergency department with left upper extremity weakness.  Upon ED evaluation felt that he had left upper limb ataxia, teleneurology was consulted and recommended CTA, MRI and MRA,  Patient was admitted for possible alcohol withdrawal and stroke workup.  Upon further investigation patient reported having rectal bleed for about a week.  GI has been consulted  Subjective: Patient seen and examined, his symptoms are improving. No more rectal bleeding after anusol started. Tolerating diet well.   Assessment & Plan: Left upper extremity ataxia MRI/MRA and CTA unremarkable Neurology recommendation appreciated, [place]-Turner Syndrome vs brachial plexopathy vs conversion disorder (pt relate been under a lot of stress) Unlikely alcohol withdrawals, patient denies recent drinking although vague about it. ETOH < 10  X-ray of left shoulder shows stable degenerative changes on the [facility] joint UDS positive for benzo getting in the hospital  Repeated Ammonia levels normal - unlikely hepatic process  For MRI of cervical spine today   Alcohol abuse with ? Withdrawal  Unlikely withdrawal - patient reassure no recent drinking.   Continue CIWA protocol for now, possible can d/c in AM   Acute blood loss anemia  Patient with hx of hemorrhoids, had similar symptoms in the past, coloscopy in 2012 show no significant findings. ? If alcohol related  Hemoglobin continues to decrease  FOBT positive  Rectal bleed has resolved after Anusol  For anoscopy and possible flex sig today - per GI no need for colonoscopy at this time  Transfuse if Hgb less than 8 or become symptomatic  Check CBC in AM   Chronic pancreatitis     Normal Lipase, tolerating diet well  Continue creon   DM type 2  CBG's slight elevated  Increase Lantus to home dose 35 units qHS  Continue SSI   HTN  BP remains stable  Continue current regimen   Anxiety/Depression  Continue home medications   DVT prophylaxis: SCD's  Code Status: Full Code Family Communication: None at bedside  Disposition Plan: Home in 1-2 days   Consultants:   Neurology   GI   Procedures:   None   Antimicrobials:  None     Objective: Vitals:   06/08/17 2156 06/09/17 0151 06/09/17 0548 06/09/17 0915  BP: 127/86 107/74 133/88 134/88  Pulse: (!) 105 (!) 113 87 (!) 102  Resp: 20 20 20 18   Temp: 98.9 F (37.2 C) 98.9 F (37.2 C) 97.9 F (36.6 C) 98 F (36.7 C)  TempSrc: Oral Oral Oral Oral  SpO2: 100% 100% 99% 100%  Height:        Intake/Output Summary (Last 24 hours) at 06/09/2017 1230 Last data filed at 06/09/2017 0840 Gross per 24 hour  Intake 240 ml  Output -  Net 240 ml   There were no vitals filed for this visit.  Examination:  General: Pt is alert, awake, not in acute distress Cardiovascular: RRR, S1/S2 +, no rubs, no gallops Respiratory: CTA bilaterally, no wheezing, no rhonchi Abdominal: Soft, NT, ND, bowel sounds + Extremities: no edema Neuro: Strength 4/5 Left, ROM has improved, tremors diminished  No focal findings, Ataxia improved    Data Reviewed: I have personally reviewed following labs and imaging studies  CBC: Recent Labs  Lab 06/07/17 1044 06/08/17  8315 06/09/17 0226  WBC 8.9 6.7 4.5  NEUTROABS 5.9  --   --   HGB 15.2 12.8* 11.1*  HCT 42.6 38.4* 32.8*  MCV 84.4 87.7 87.9  PLT 355 267 176   Basic Metabolic Panel: Recent Labs  Lab 06/07/17 1044 06/08/17 0509 06/09/17 0226  NA 137 136 136  K 3.9 3.8 3.7  CL 100* 101 104  CO2 24 23 21*  GLUCOSE 88 80 338*  BUN 16 9 <5*  CREATININE 1.33* 0.96 1.00  CALCIUM 8.7* 8.7* 8.3*  MG  --   --  1.6*   GFR: CrCl cannot be calculated (Unknown ideal  weight.). Liver Function Tests: Recent Labs  Lab 06/07/17 1044 06/09/17 0226  AST 66* 113*  ALT 53 63  ALKPHOS 107 101  BILITOT 0.9 1.2  PROT 7.8 5.5*  ALBUMIN 3.7 2.8*   Recent Labs  Lab 06/08/17 1719  LIPASE 17  AMYLASE 101*   Recent Labs  Lab 06/08/17 1505 06/09/17 0226  AMMONIA 51* 30   Coagulation Profile: Recent Labs  Lab 06/07/17 1044  INR 1.01   Cardiac Enzymes: Recent Labs  Lab 06/07/17 1044 06/07/17 2007  TROPONINI <0.03 <0.03   BNP (last 3 results) No results for input(s): PROBNP in the last 8760 hours. HbA1C: Recent Labs    06/08/17 0509  HGBA1C 8.4*   CBG: Recent Labs  Lab 06/08/17 0230 06/08/17 1815 06/08/17 2010 06/09/17 0147 06/09/17 0828  GLUCAP 160* 366* 227* 320* 215*   Lipid Profile: Recent Labs    06/08/17 0509  CHOL 109  HDL 58  LDLCALC 25  TRIG 132  CHOLHDL 1.9   Thyroid Function Tests: No results for input(s): TSH, T4TOTAL, FREET4, T3FREE, THYROIDAB in the last 72 hours. Anemia Panel: Recent Labs    06/08/17 1504 06/09/17 0226  VITAMINB12 783  --   FOLATE  --  29.4  FERRITIN  --  527*  TIBC  --  244*  IRON  --  120  RETICCTPCT  --  0.7   Sepsis Labs: No results for input(s): PROCALCITON, LATICACIDVEN in the last 168 hours.  No results found for this or any previous visit (from the past 240 hour(s)).    Radiology Studies: Dg Chest 2 View  Result Date: 06/07/2017 CLINICAL DATA:  LEFT arm weakness, dry heaves, type II diabetes mellitus, GERD, hypertension EXAM: CHEST  2 VIEW COMPARISON:  03/26/2017 FINDINGS: Upper normal heart size. Mediastinal contours and pulmonary vascularity normal. Lungs clear. No pleural effusion or pneumothorax. Bones unremarkable. IMPRESSION: No acute abnormalities. Electronically Signed   By: Lavonia Dana M.D.   On: 06/07/2017 20:05   Mr Brain Wo Contrast  Result Date: 06/07/2017 CLINICAL DATA:  42 y/o  M; left arm weakness and numbness. EXAM: MRI HEAD WITHOUT CONTRAST MRA HEAD  WITHOUT CONTRAST TECHNIQUE: Multiplanar, multiecho pulse sequences of the brain and surrounding structures were obtained without intravenous contrast. Angiographic images of the head were obtained using MRA technique without contrast. COMPARISON:  06/07/2017 CT head and CTA head. FINDINGS: MRI HEAD FINDINGS Brain: No acute infarction, hemorrhage, hydrocephalus, extra-axial collection or mass lesion. No structural or significant signal abnormality of the brain. Vascular: As below. Skull and upper cervical spine: Normal marrow signal. Sinuses/Orbits: Mild sphenoid and left maxillary sinus mucosal thickening. No abnormal signal of mastoid air cells. Orbits are unremarkable. Other: None. MRA HEAD FINDINGS Internal carotid arteries:  Patent. Anterior cerebral arteries:  Patent. Middle cerebral arteries: Patent. Anterior communicating artery: Patent. Posterior communicating arteries:  Patent.  Posterior cerebral arteries:  Patent. Basilar artery:  Patent. Vertebral arteries:  Patent. No evidence of high-grade stenosis, large vessel occlusion, or aneurysm. IMPRESSION: 1. Normal MRI of the brain. 2. Normal MRA of the head. Electronically Signed   By: Kristine Garbe M.D.   On: 06/07/2017 23:30   Dg Shoulder Left  Result Date: 06/08/2017 CLINICAL DATA:  Left arm weakness and numbness beginning at 6 a.m. yesterday. Decreased range of motion. EXAM: LEFT SHOULDER - 2+ VIEW COMPARISON:  Left shoulder radiographs 09/13/2015 FINDINGS: Degenerative changes of the left AC joint are stable. The clavicle is intact. The left shoulder is located. No acute bone or soft tissue abnormalities are present. The left hemithorax is clear. IMPRESSION: 1. No acute cardiopulmonary disease. 2. Stable degenerative changes at the left [facility] joint. Electronically Signed   By: San Morelle M.D.   On: 06/08/2017 16:17   Mr Jodene Nam Head Wo Contrast  Result Date: 06/07/2017 CLINICAL DATA:  42 y/o  M; left arm weakness and numbness. EXAM:  MRI HEAD WITHOUT CONTRAST MRA HEAD WITHOUT CONTRAST TECHNIQUE: Multiplanar, multiecho pulse sequences of the brain and surrounding structures were obtained without intravenous contrast. Angiographic images of the head were obtained using MRA technique without contrast. COMPARISON:  06/07/2017 CT head and CTA head. FINDINGS: MRI HEAD FINDINGS Brain: No acute infarction, hemorrhage, hydrocephalus, extra-axial collection or mass lesion. No structural or significant signal abnormality of the brain. Vascular: As below. Skull and upper cervical spine: Normal marrow signal. Sinuses/Orbits: Mild sphenoid and left maxillary sinus mucosal thickening. No abnormal signal of mastoid air cells. Orbits are unremarkable. Other: None. MRA HEAD FINDINGS Internal carotid arteries:  Patent. Anterior cerebral arteries:  Patent. Middle cerebral arteries: Patent. Anterior communicating artery: Patent. Posterior communicating arteries:  Patent. Posterior cerebral arteries:  Patent. Basilar artery:  Patent. Vertebral arteries:  Patent. No evidence of high-grade stenosis, large vessel occlusion, or aneurysm. IMPRESSION: 1. Normal MRI of the brain. 2. Normal MRA of the head. Electronically Signed   By: Kristine Garbe M.D.   On: 06/07/2017 23:30      Scheduled Meds: . amLODipine  10 mg Oral Daily  . atorvastatin  20 mg Oral Daily  . cloNIDine  0.1 mg Oral TID  . escitalopram  20 mg Oral Daily  . folic acid  1 mg Oral Daily  . hydrocortisone  25 mg Rectal BID  . insulin aspart  0-15 Units Subcutaneous Q6H  . insulin glargine  20 Units Subcutaneous QHS  . lipase/protease/amylase  36,000 Units Oral TID WC  . lisinopril  20 mg Oral Daily  . LORazepam  1 mg Intravenous Once  . multivitamin with minerals  1 tablet Oral Daily  . pantoprazole  40 mg Oral Q0600  . QUEtiapine  300 mg Oral QHS  . thiamine  100 mg Oral Daily   Or  . thiamine  100 mg Intravenous Daily   Continuous Infusions:   LOS: 2 days    Time  spent: Total of 25 minutes spent with pt, greater than 50% of which was spent in discussion of  treatment, counseling and coordination of care   Chipper Oman, MD Pager: Text Page via [email or web address]   If 7PM-7AM, please contact night-coverage [email or web address] 06/09/2017, 12:30 PM

## 2017-06-09 NOTE — Progress Notes (Addendum)
   Patient Name: Willie [name] Date of Encounter: 06/09/2017, 10:11 AM    Subjective  Patient is a 42 yo male seen with PMH of HTN, DM2, etoh abuse, and chronic pancreatitis admitted to the hospital for LUE weakness. During his stay he reported having rectal bleeding for approximately 1 week. He has hx of hemorrhoids and sigmoid diverticulosis with similar sxs in the past. Most recent colonoscopy 2012 w, no other significant findings.  He reports he is not having rectal bleeding today, hydrocortisone suppository has been helping. He does report similar previous episodes for a few years with long gaps (weeks-months) between episodes without any bleeding. Does not recall recent straining during BMs but he has been dealing with a bad cold and has been coughing significantly.   CBC today shows 11.1 Hgb, slightly decreased TIBC and reticulocytes, increased saturation ratios and ferritin, and iron and folate wnl. Albumin 2.8. B12, lipase, and amylase wnl.    Objective  BP 134/88 (BP Location: Left Arm)   Pulse (!) 102   Temp 98 F (36.7 C) (Oral)   Resp 18   Ht 6' (1.829 m)   SpO2 100%   BMI 29.39 kg/m   General: WDWN male, comfortable, no acute distress HEENT: Normocephalic and atraumatic, non-incteric conjunctivae  Lungs: CTAB CV: RRR, no mgr, no LE edema GI: soft, non-tender, Pos bowel sounds, no TTP Rectal:   Mild anal stenosis nontender no mass   Anoscopy: Gr 2 inflamed internal hemorrhoids all 3 positions Gatha Mayer, MD, [facility]    Assessment and Plan   Anemia/Rectal bleeding- mild, likely d/t chronic dz. Anoscopy to be performed later today, bleeding likely d/t internal hemorrhoids. If exam is unremarkable, will perform flexsig to screen for diverticular bleed or other etiologies. Colonoscopy not warranted at this time.   Chronic pancreatitis- continue current tx regimen with creon and supportive measures    I have personally seen the patient, reviewed and  repeated key elements of the history and physical and participated in formation of the assessment and plan the student has documented.  I did the rectal and anoscopy as inserted into above note.  Clinical scenario and exam c/w hemorrhoidal bleeding. I suspect  Recent coughing issues precipitated this bleedingFerritin is high and MCV NL, suspect anemia of chronic disease and some dilutional effect causing decreased Hgb and not a significant GI bleed.  Rec: 1) HC suppositories while here 2) Would rx hydrocortisone cream at dc (suppositories are expensive) 3) He has 3/7 appt Dr. Havery Moros re: bleeding hemorrhoids - anticipate banding - I explained procedures to him 4) Signing off - call back prn  Gatha Mayer, MD, [facility] [phone] (pager) 06/09/2017 1:23 PM

## 2017-06-09 NOTE — Progress Notes (Signed)
NEUROHOSPITALISTS STROKE TEAM - DAILY PROGRESS NOTE   ADMISSION HISTORY:  Willie [name] is an 42 y.o. male with a past medical history of alcohol abuse, chronic pancreatitis on imaging CT scan 2018, current smoker, insulin-dependent diabetes, who presented with left upper extremity weakness.  Patient woke up around 9 AM to realize he had impaired range of motion in his left upper extremities from his shoulders to his fingers with poor coordination and weakness, numbness and tingling only when sitting or standing but normal when laying down.  Symptoms were associated with stiffness in his arm, inability to raise left arm above shoulders and left arm was flailing when trying to do purposeful activity.  The symptoms were not associated with slurred speech, loss of consciousness, headache, dizziness, visual disturbance or other extremity more to deficits.  But he did describe having sharp stabbing chest pain 10 out of 10.     He went to ER at [facility], neurology was consulted and did not recommend thrombolytics due to patient only had a left upper extremity ataxia.  This Noncon CT of the head, CTA of the head and neck did not reveal any further findings.  Patient was transferred to [facility] for further evaluation.  MRI of the brain as well as MRA of the head and neck was also negative.  Patient admits to history of low back pain but denies any recent neck trauma or head trauma.  Admits to improvement of weakness in the left upper extremity but numbness and burning as well as ataxia continues to persist.   Neuro was consulted for further evaluation This is the first time he is having the symptoms.  Never had these symptoms before.  No history of strokes.  Brother had a stroke in his mid 14s.  LSN: 06/06/17- bedtime tPA Given: No:  Premorbid modified Rankin scale (mRS): 0 NIHSS - 1  SUBJECTIVE (INTERVAL HISTORY) No family is at the bedside. Patient  is found laying in bed in NAD. Overall he feels his condition is unchanged. Voices no new complaints. No new events reported overnight. Patient continues to complain of left upper extremity weakness/numbness. + Giveway on today's exam.   OBJECTIVE Lab Results: CBC:  Recent Labs  Lab 06/07/17 1044 06/08/17 0509 06/09/17 0226  WBC 8.9 6.7 4.5  HGB 15.2 12.8* 11.1*  HCT 42.6 38.4* 32.8*  MCV 84.4 87.7 87.9  PLT 355 267 241   BMP: Recent Labs  Lab 06/07/17 1044 06/08/17 0509 06/09/17 0226  NA 137 136 136  K 3.9 3.8 3.7  CL 100* 101 104  CO2 24 23 21*  GLUCOSE 88 80 338*  BUN 16 9 <5*  CREATININE 1.33* 0.96 1.00  CALCIUM 8.7* 8.7* 8.3*  MG  --   --  1.6*   Liver Function Tests:  Recent Labs  Lab 06/07/17 1044 06/09/17 0226  AST 66* 113*  ALT 53 63  ALKPHOS 107 101  BILITOT 0.9 1.2  PROT 7.8 5.5*  ALBUMIN 3.7 2.8*   Recent Labs  Lab 06/08/17 1719  LIPASE 17  AMYLASE 101*   Recent Labs  Lab 06/08/17 1505 06/09/17 0226  AMMONIA 51* 30   Cardiac Enzymes:  Recent Labs  Lab 06/07/17 1044 06/07/17 2007  TROPONINI <0.03 <0.03   Coagulation Studies:  Recent Labs    06/07/17 1044  APTT 29  INR 1.01   [place] Work -Up:  Recent Labs    06/08/17 1504 06/09/17 0226  VITAMINB12 783  --  FOLATE  --  29.4  IRON  --  120  RETICCTPCT  --  0.7   Urine Drug Screen:     Component Value Date/Time   LABOPIA NONE DETECTED 06/08/2017 1704   COCAINSCRNUR NONE DETECTED 06/08/2017 1704   LABBENZ POSITIVE (A) 06/08/2017 1704   AMPHETMU NONE DETECTED 06/08/2017 1704   THCU NONE DETECTED 06/08/2017 1704   LABBARB NONE DETECTED 06/08/2017 1704    Alcohol Level:  Recent Labs  Lab 06/08/17 1719  ETH <10    PHYSICAL EXAM Temp:  [97.9 F (36.6 C)-98.9 F (37.2 C)] 98 F (36.7 C) (02/11 0915) Pulse Rate:  [87-132] 102 (02/11 0915) Resp:  [18-20] 18 (02/11 0915) BP: (107-134)/(74-88) 134/88 (02/11 0915) SpO2:  [99 %-100 %] 100 % (02/11 0915) General -  Well nourished, well developed, in no apparent distress HEENT-  Normocephalic,  Cardiovascular - Regular rate and rhythm  Respiratory - Lungs clear bilaterally. No wheezing. Abdomen- All 4 quadrants palpated and nontender Musculoskeletal-left shoulder joint tenderness, no deformity or swelling, moves all extremities equally except for left arm Skin-warm and dry  Neurological Examination Mental Status: Alert, oriented, thought content appropriate.  Speech fluent without evidence of aphasia.  Able to follow 3 step commands without difficulty. Cranial Nerves: II: Discs flat bilaterally; Visual fields grossly normal,  III,IV, VI: ptosis not present, extra-ocular motions intact bilaterally pupils equal, round, reactive to light and accommodation V,VII: smile symmetric, facial light touch sensation normal bilaterally VIII: Hearing intact to voice IX,X: uvula rises symmetrically XI: bilateral shoulder shrug XII: midline tongue extension Motor: Decreased range of motion in left arm with shoulder pain with movement and left arm ataxia only when sitting up.  Normal left arm movements without ataxia when lying down. +Giveway on exam Right : Upper extremity   4+/5    Left:     Upper extremity   5/5  Lower extremity   5/5     Lower extremity   5/5 Tone and bulk:normal tone throughout; no atrophy noted Sensory: Pinprick and light touch decreased in left arm except hands.  Essential tremors left arm Deep Tendon Reflexes: 2+ and symmetric throughout Plantars: Right: downgoing   Left: downgoing Cerebellar: normal finger-to-nose, normal rapid alternating movements and normal heel-to-shin test Gait: normal gait and station  IMAGING: I have personally reviewed the radiological images below and agree with the radiology interpretations.  Dg Shoulder Left Result Date: 06/08/2017  IMPRESSION: 1. No acute cardiopulmonary disease. 2. Stable degenerative changes at the left [facility] joint. Electronically Signed    By: San Morelle M.D.   On: 06/08/2017 16:17   MRI/ Mra Head Wo Contrast Result Date: 06/07/2017  IMPRESSION: 1. Normal MRI of the brain. 2. Normal MRA of the head. Electronically Signed   By: Kristine Garbe M.D.   On: 06/07/2017 23:30   CT ANGIOGRAPHY HEAD AND NECK  06/07/2017 11:31 IMPRESSION: Negative CTA of the head and neck.  MRI Cervical                                                     PENDING Echocardiogram:  PENDING    IMPRESSION: Willie [name] is a 42 y.o. male with PMH of  alcohol abuse, chronic pancreatitis on imaging CT scan 2018, current smoker, insulin-dependent diabetes, who presented with left upper extremity weakness, numbness and tingling and ataxia only when sitting up. All imaging thus far is negative for acute findings.  TIA vs brachial plexopathy   Suspected Etiology: unknown at this time Resultant Symptoms: Left arm weakness and ataxia Stroke Risk Factors: diabetes mellitus, hyperlipidemia, hypertension and smoking Other Stroke Risk Factors: Family hx stroke, OSA  Outstanding Stroke Work-up Studies:     Echocardiogram:                                                    PENDING MRI Cervical                                                            PENDING  06/09/2017 ASSESSMENT:   Neuro exam non-focal with give way on strength testing. Patient continues to complain of  Left sided weakness/numbness. Will order MRI Cervical to verify findings. ECHO remains pending for stroke workup  PLAN  06/09/2017: Continue Statin, May add ASA 81 mg daily once cleared by GI for active GI bleed Frequent neuro checks Telemetry monitoring PT/OT/SLP Consult Case Management /MSW Ongoing aggressive stroke risk factor management Patient counseled to be compliant with his antithrombotic medications Patient counseled on Lifestyle modifications including, Diet, Exercise, and Stress Follow up with [facility]roke [facility] in 6 weeks, May need outpatient EMG nerve conduction studies if MRI Cervical with positive findings  HYPERTENSION: Stable, Avoid Hypotension and Dehydration Long term BP goal normotensive. May slowly restart home B/P medications  Home Meds: Norvasc, Lisinopril  HYPERLIPIDEMIA:    Component Value Date/Time   CHOL 109 06/08/2017 0509   TRIG 132 06/08/2017 0509   HDL 58 06/08/2017 0509   CHOLHDL 1.9 06/08/2017 0509   VLDL 26 06/08/2017 0509   LDLCALC 25 06/08/2017 0509  Home Meds:  Lipitor 20 mg LDL  goal < 70 Continued on Lipitor to 20 mg daily Continue statin at discharge if LFT's stable, would continue to monitor closely  DIABETES: Lab Results  Component Value Date   HGBA1C 8.4 (H) 06/08/2017  HgbA1c goal < 7.0 Currently on: Novolog Continue CBG monitoring and SSI to maintain glucose 140-180 mg/dl DM education   TOBACCO ABUSE Current smoker Smoking cessation counseling provided Nicotine patch provided  Other Active Problems: Active Problems:   Diabetes type 2, uncontrolled (HCC)   Essential hypertension   Depression with anxiety   Chronic pancreatitis (HCC)   Alcohol abuse   Left arm weakness   Anemia, blood loss   Bleeding internal hemorrhoids   Anemia of chronic disease    Hospital day # 2 VTE prophylaxis: SCD's  Diet : Fall precautions Diet Heart Room service appropriate? Yes; Fluid consistency: Thin   FAMILY UPDATES:  family at bedside  TEAM UPDATES: Doreatha Lew, MD   Prior Home Stroke Medications: No antithrombotic  Discharge Stroke Meds:  Please discharge patient on TBD   Disposition: 01-Home or Self Care Therapy Recs:  PENDING Follow Up:  Follow-up Information    Dennie Bible, NP. Schedule an appointment as soon as possible for a visit in 6 week(s).   Specialty:  Family Medicine Contact information: [address] [place] [address] [place],  NP -PCP Follow up in 1-2 weeks      Assessment & plan discussed with with attending physician and they are in agreement.    Renie Ora Stroke Neurology Team 06/09/2017 2:08 PM  To contact Stroke Continuity provider, please refer to [email or web address] After hours, contact General Neurology  Attending note: I reviewed above note and agree with the assessment and plan. I have made any additions or clarifications directly to the above note. Pt was seen and examined.   42 year old male with history of alcohol use, dermatitis, DM, HLD, HTN, OSA on CPAP, smoker admitted for left arm weakness with pain on movement with left arm numbness and discoordination.  Denies any left leg weakness or numbness, facial weakness or numbness, denies neck pain.  CT negative, CTA head and neck negative, MRI and MRA head negative.  2D echo pending.  A1c 8.4 and LDL 25.  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 [place]-Turner syndrome vs. cervical radiculopathy.  Will check MRI C-spine, and possible outpatient EMG/NCS.  Continue Lipitor and BP and DM management with home medication.  PT/OT recommended outpatient OT.  Rosalin Hawking, MD PhD Stroke Neurology 06/09/2017 9:10 PM

## 2017-06-09 NOTE — Care Management Note (Signed)
Case Management Note  Patient Details  Name: Willie [name] MRN: [id number] Date of Birth: Jul 01, 1975  Subjective/Objective:     Pt in to r/o CVA. He is from home alone. Pt on CIWA protocol. No insurance but has PCP listed.                Action/Plan: CM will meet with pt tomorrow about insurance. OT recommending Outpatient therapy. No f/u per PT. CM following.  Expected Discharge Date:                  Expected Discharge Plan:  OP Rehab  In-House Referral:     Discharge planning Services  CM Consult  Post Acute Care Choice:    Choice offered to:     DME Arranged:    DME Agency:     HH Arranged:    HH Agency:     Status of Service:  In process, will continue to follow  If discussed at Long Length of Stay Meetings, dates discussed:    Additional Comments:  Pollie Friar, RN 06/09/2017, 4:19 PM

## 2017-06-09 NOTE — Progress Notes (Signed)
Occupational Therapy Treatment Patient Details Name: Willie [name] MRN: [id number] DOB: [birth year] Today's Date: 06/09/2017    History of present illness 42 year old man with prior history of alcohol abuse, chronic pancreatitis on imaging CT scan 2018, current smoker, insulin-dependent diabetes, who presented with left upper extremity weakness. MRI negative.    OT comments  Pt progressing towards established OT goals, declining ADL completion this session though agreeable to UE HEP completion. Pt completing transfers from bed to/from recliner with MinGuard this session; completing LUE exercises in sitting and supine across multiple planes for 10-15x each. Pt with inconsistent demonstration of LUE deficits, occasionally limited due to pain and requiring increased time/effort to complete exercises though intermittently moving and using UE without difficulty. Will continue to follow acutely to maximize Pt's overall strength, safety, and independence with ADLs and mobility, POC remains appropriate at this time.    Follow Up Recommendations  Outpatient OT    Equipment Recommendations  None recommended by OT          Precautions / Restrictions Precautions Precautions: None Restrictions Weight Bearing Restrictions: No       Mobility Bed Mobility Overal bed mobility: Independent                Transfers Overall transfer level: Independent Equipment used: None                  Balance Overall balance assessment: Modified Independent Sitting-balance support: Feet supported;No upper extremity supported Sitting balance-Leahy Scale: Normal     Standing balance support: No upper extremity supported;During functional activity Standing balance-Leahy Scale: Good                   Standardized Balance Assessment Standardized Balance Assessment : Dynamic Gait Index   Dynamic Gait Index Level Surface: Mild Impairment Change in Gait Speed: Mild Impairment Gait  with Horizontal Head Turns: Mild Impairment Gait with Vertical Head Turns: Mild Impairment Gait and Pivot Turn: Normal Step Over Obstacle: Normal Step Around Obstacles: Mild Impairment Steps: Mild Impairment Total Score: 18     ADL either performed or assessed with clinical judgement   ADL                                       Functional mobility during ADLs: Min guard General ADL Comments: Pt declined ADL completion this session, agreeable to LUE exercises, completing in both sitting (in recliner) and supine for gravity eliminated; Pt with inconsistent demonstration of UE use, sometimes requiring increased time/effort to perform ROM however uses arm functionally including straigthening bed pad prior to returning to supine                        Cognition Arousal/Alertness: Awake/alert Behavior During Therapy: WFL for tasks assessed/performed Overall Cognitive Status: Within Functional Limits for tasks assessed                                          Exercises General Exercises - Upper Extremity Shoulder Flexion: AROM;Left;10 reps;Supine;Seated Shoulder Horizontal ABduction: AROM;10 reps;Seated;Supine;Left Shoulder Horizontal ADduction: AROM;10 reps;Left;Supine;Seated Elbow Flexion: AROM;10 reps;Seated;Left Elbow Extension: AROM;Left;10 reps;Seated Hand Exercises Forearm Supination: AROM;10 reps;Seated Forearm Pronation: AROM;10 reps;Seated          General Comments      Pertinent  Vitals/ Pain       Pain Assessment: Faces Faces Pain Scale: Hurts little more Pain Location: L shoulder with flexion Pain Descriptors / Indicators: Grimacing;Guarding;Sore Pain Intervention(s): Limited activity within patient's tolerance;Monitored during session                                                          Frequency  Min 2X/week        Progress Toward Goals  OT Goals(current goals can now be found in the  care plan section)  Progress towards OT goals: Progressing toward goals  Acute Rehab OT Goals Patient Stated Goal: figure out what's wrong OT Goal Formulation: With patient Time For Goal Achievement: 06/22/17 Potential to Achieve Goals: Good  Plan Discharge plan remains appropriate                     AM-PAC PT "6 Clicks" Daily Activity     Outcome Measure   Help from another person eating meals?: None Help from another person taking care of personal grooming?: A Little Help from another person toileting, which includes using toliet, bedpan, or urinal?: A Little Help from another person bathing (including washing, rinsing, drying)?: A Little Help from another person to put on and taking off regular upper body clothing?: A Little Help from another person to put on and taking off regular lower body clothing?: A Little 6 Click Score: 19    End of Session    OT Visit Diagnosis: Unsteadiness on feet (R26.81);Other (comment)(LUE weakness )   Activity Tolerance Patient tolerated treatment well   Patient Left in bed;with call bell/phone within reach   Nurse Communication Mobility status        Time: 1211-1226 OT Time Calculation (min): 15 min  Charges: OT General Charges $OT Visit: 1 Visit OT Treatments $Therapeutic Activity: 8-22 mins  Lou Cal, [place] Pager [phone] 06/09/2017    Raymondo Band 06/09/2017, 4:51 PM

## 2017-06-10 ENCOUNTER — Inpatient Hospital Stay (HOSPITAL_COMMUNITY): Payer: No Typology Code available for payment source

## 2017-06-10 DIAGNOSIS — I5030 Unspecified diastolic (congestive) heart failure: Secondary | ICD-10-CM

## 2017-06-10 DIAGNOSIS — K648 Other hemorrhoids: Secondary | ICD-10-CM

## 2017-06-10 DIAGNOSIS — R52 Pain, unspecified: Secondary | ICD-10-CM

## 2017-06-10 LAB — GLUCOSE, CAPILLARY
Glucose-Capillary: 148 mg/dL — ABNORMAL HIGH (ref 65–99)
Glucose-Capillary: 108 mg/dL — ABNORMAL HIGH (ref 65–99)

## 2017-06-10 LAB — ECHOCARDIOGRAM COMPLETE
HEIGHTINCHES: 72 in
WEIGHTICAEL: 3477.98 [oz_av]

## 2017-06-10 MED ORDER — GABAPENTIN 300 MG PO CAPS: 300.0000 mg | ORAL_CAPSULE | Freq: Every day | ORAL | 0 refills | Status: DC

## 2017-06-10 MED ORDER — INFLUENZA VAC SPLIT QUAD 0.5 ML IM SUSY: 0.5000 mL | PREFILLED_SYRINGE | INTRAMUSCULAR | Status: DC

## 2017-06-10 MED ORDER — FOLIC ACID 1 MG PO TABS: 1.0000 mg | ORAL_TABLET | Freq: Every day | ORAL | 0 refills | Status: DC

## 2017-06-10 MED ORDER — HYDROCORTISONE ACETATE 25 MG RE SUPP: 25.0000 mg | Freq: Two times a day (BID) | RECTAL | 0 refills | Status: DC

## 2017-06-10 MED ORDER — GABAPENTIN 300 MG PO CAPS: 300.0000 mg | ORAL_CAPSULE | Freq: Every day | ORAL | Status: DC

## 2017-06-10 MED FILL — HEMMOREX-HC 25 MG SUPP: 25 | 6 days supply | Qty: 12 | Fill #0

## 2017-06-10 MED FILL — GABAPENTIN 300 MG CAPSULE: 300 | 30 days supply | Qty: 30 | Fill #0

## 2017-06-10 MED FILL — FOLIC ACID 1 MG TABLET: 1 | 30 days supply | Qty: 30 | Fill #0

## 2017-06-10 NOTE — Progress Notes (Signed)
Pt wanted a flu shot and then decided against it. Discharge info given. IV taken out and tele d/c. Pt walked down by himself with all of belongings.

## 2017-06-10 NOTE — Care Management Note (Addendum)
Case Management Note  Patient Details  Name: Willie [name] MRN: [id number] Date of Birth: Jun 24, 1975  Subjective/Objective:                    Action/Plan: Pt discharging home with self care. Pt doesn't have insurance listed but states he has [place]'s plan through his wife. Pt unable to produce a card. CM notified financial counseling of him possibly having insurance.  CM consulted for outpatient therapy. CM met with the patient and he would like to attend at [facility]. Orders in Epic and information on the AVS.  Pt has transportation home.   Addendum: Per therapy d/t it being an arm issue patient will require physical therapy instead of occupational.  Expected Discharge Date:                  Expected Discharge Plan:  OP Rehab  In-House Referral:     Discharge planning Services  CM Consult  Post Acute Care Choice:    Choice offered to:     DME Arranged:    DME Agency:     HH Arranged:    [place] Agency:     Status of Service:  Completed, signed off  If discussed at [employer] of Stay Meetings, dates discussed:    Additional Comments:  Pollie Friar, RN 06/10/2017, 2:25 PM

## 2017-06-10 NOTE — Discharge Summary (Signed)
Physician Discharge Summary  Willie [name]  QBH:[id number]  DOB: [birth year]  DOA: 06/07/2017 PCP: Debbrah Alar, NP  Admit date: 06/07/2017 Discharge date: 06/10/2017  Admitted From: Home  Disposition:  Home   Recommendations for Outpatient Follow-up:  1. Follow up with PCP in 1-2 weeks 2. Follow up with Neurology, for EMG  3. Please obtain BMP/CBC in one week Hgb and Cr  4. Follow up with GI in 3/7 Dr Luvenia Starch   Home Health: OT   Discharge Condition: Stable  CODE STATUS: Full Code  Diet recommendation: Heart Healthy   Brief/Interim Summary: For full details see H&P/Progress note, but in brief, Willie [name] is a 42 year old male with history of alcohol abuse, hypertension, diabetes type 2, chronic pancreatitis, who presented to the emergency department with left upper extremity weakness.  Upon ED evaluation felt that he had left upper limb ataxia, teleneurology was consulted and recommended CTA, MRI and MRA,  Patient was admitted for possible alcohol withdrawal and stroke workup.  Neurology was consulted, w/u so far negative, recommended MRI C spine which was unremarkable. Neurology recommending gabapenting and EMG studies.  Upon further investigation patient reported having rectal bleed for about a week.  GI has been consulted. Anoscopy was performed which showed hemorrhoids. Rectal bleeding resolved. Patient clinically improved his symptoms mostly resolved and he was deemed stable for discharge to follow up with neurology and PCP.   Subjective: Patient seen and examined, symptoms significantly improved, mild left arm weakness. No more rectal bleeding. No acute events over night.   Discharge Diagnoses/Hospital Course:  Left upper extremity ataxia MRI/MRA and CTA unremarkable, MRI C-spine no significant findings  Neurology recommending EMG as OP, unclear etiology at this time: could be [place]-Turner Syndrome vs brachial plexopathy vs diabetic amyothophy vs conversion  disorder (pt relate been under a lot of stress) Unlikely alcohol withdrawals, patient denies recent drinking although vague about it. ETOH < 10  X-ray of left shoulder shows stable degenerative changes on the [facility] joint UDS positive for benzo getting it the hospital  Repeated Ammonia levels normal - unlikely hepatic process  Will prescribe gabapentin 300 mg nightly Follow up with Dr Erlinda Hong in 3-4 weeks   Alcohol abuse with ? Withdrawal  Not really withdrawal - patient reassure no recent drinking.   Was treated with CIWA protocol  No signs of withdrawals   Acute blood loss anemia  Due to hemorrhoids showed on anoscopy FOBT positive  Rectal bleed has resolved after Anusol - continue Anusol Check CBC in 1 week  Chronic pancreatitis   Normal Lipase, tolerating diet well  Treated with Creon during hospital stay, will discontinue for now and follow-up with GI to discuss if clinically needed  DM type 2  CBG's slight elevated  Increase Lantus to home dose 35 units qHS  Continue SSI   HTN  BP remains stable  Continue current regimen   Anxiety/Depression  Continue home medications   All other chronic medical condition were stable during the hospitalization.  Patient was seen by physical therapy, recommending op OT  On the day of the discharge the patient's vitals were stable, and no other acute medical condition were reported by patient. the patient was felt safe to be discharge to home   Discharge Instructions  You were cared for by a hospitalist during your hospital stay. If you have any questions about your discharge medications or the care you received while you were in the hospital after you are discharged, you can call the unit  and asked to speak with the hospitalist on call if the hospitalist that took care of you is not available. Once you are discharged, your primary care physician will handle any further medical issues. Please note that NO REFILLS for any discharge medications  will be authorized once you are discharged, as it is imperative that you return to your primary care physician (or establish a relationship with a primary care physician if you do not have one) for your aftercare needs so that they can reassess your need for medications and monitor your lab values.  Discharge Instructions    Ambulatory referral to Neurology   Complete by:  As directed    An appointment is requested in approximately: 6 weeks   Ambulatory referral to Occupational Therapy   Complete by:  As directed    Call MD for:  difficulty breathing, headache or visual disturbances   Complete by:  As directed    Call MD for:  extreme fatigue   Complete by:  As directed    Call MD for:  hives   Complete by:  As directed    Call MD for:  persistant dizziness or light-headedness   Complete by:  As directed    Call MD for:  persistant nausea and vomiting   Complete by:  As directed    Call MD for:  redness, tenderness, or signs of infection (pain, swelling, redness, odor or green/yellow discharge around incision site)   Complete by:  As directed    Call MD for:  severe uncontrolled pain   Complete by:  As directed    Call MD for:  temperature >100.4   Complete by:  As directed    Diet - low sodium heart healthy   Complete by:  As directed    Increase activity slowly   Complete by:  As directed      Allergies as of 06/10/2017      Reactions   Invokamet [canagliflozin-metformin Hcl] Other (See Comments)   Lactic acidosis   Metformin And Related Other (See Comments)   DRASTIC drop in blood sugar      Medication List    STOP taking these medications   methocarbamol 500 MG tablet Commonly known as:  ROBAXIN   sucralfate 1 GM/10ML suspension Commonly known as:  CARAFATE   tadalafil 10 MG tablet Commonly known as:  CIALIS   triamcinolone cream 0.1 % Commonly known as:  KENALOG   valACYclovir 500 MG tablet Commonly known as:  VALTREX   zolpidem 5 MG tablet Commonly known  as:  AMBIEN     TAKE these medications   amLODipine 10 MG tablet Commonly known as:  NORVASC Take 1 tablet (10 mg total) by mouth daily.   atorvastatin 20 MG tablet Commonly known as:  LIPITOR Take 1 tablet (20 mg total) by mouth daily.   cloNIDine 0.1 MG tablet Commonly known as:  CATAPRES Take 1 tablet (0.1 mg total) by mouth 3 (three) times daily.   escitalopram 20 MG tablet Commonly known as:  LEXAPRO Take 1 tablet (20 mg total) by mouth daily.   folic acid 1 MG tablet Commonly known as:  FOLVITE Take 1 tablet (1 mg total) by mouth daily. Start taking on:  06/11/2017   FREESTYLE FREEDOM LITE w/Device Kit 2 (two) times daily   freestyle lancets 2 (two) times daily   gabapentin 300 MG capsule Commonly known as:  NEURONTIN Take 1 capsule (300 mg total) by mouth at bedtime.   glucose blood  test strip Commonly known as:  FREESTYLE LITE Use as instructed   hydrocortisone 25 MG suppository Commonly known as:  ANUSOL-HC Place 1 suppository (25 mg total) rectally 2 (two) times daily.   hydrOXYzine 50 MG tablet Commonly known as:  ATARAX/VISTARIL Take 1 tablet (50 mg total) by mouth 3 (three) times daily as needed. What changed:  reasons to take this   Insulin Glargine 100 UNIT/ML Solostar Pen Commonly known as:  LANTUS SOLOSTAR Inject 40 Units into the skin daily at 10 pm. What changed:    how much to take  when to take this   insulin lispro 100 UNIT/ML KiwkPen Commonly known as:  HUMALOG KWIKPEN 3 times a day (just before each meal) 09-10-08 units, What changed:    how much to take  how to take this  when to take this  additional instructions   Insulin Pen Needle 31G X 8 MM Misc Commonly known as:  ULTICARE SHORT PEN NEEDLES Use once daily to inject insulin.   lisinopril 20 MG tablet Commonly known as:  PRINIVIL,ZESTRIL TAKE 1 TABLET (20 MG TOTAL) BY MOUTH DAILY. What changed:    how much to take  how to take this  when to take  this  additional instructions   multivitamin tablet Take 1 tablet by mouth daily.   ondansetron 8 MG disintegrating tablet Commonly known as:  ZOFRAN ODT Take 1 tablet (8 mg total) by mouth every 8 (eight) hours as needed for nausea or vomiting.   pantoprazole 40 MG tablet Commonly known as:  PROTONIX Take 1 tablet (40 mg total) by mouth 2 (two) times daily.   potassium chloride 10 MEQ tablet Commonly known as:  K-DUR,KLOR-CON Take 2 tablets (20 mEq total) by mouth daily.   QUEtiapine 300 MG tablet Commonly known as:  SEROQUEL Take 1 tablet (300 mg total) by mouth at bedtime.   thiamine 100 MG tablet Take 1 tablet (100 mg total) by mouth daily.      Follow-up Information    Rosalin Hawking, MD. Schedule an appointment as soon as possible for a visit in 6 week(s).   Specialty:  Neurology Contact information: [address] [place] [address] [facility].   Specialty:  Rehabilitation Why:  They will contact you for the first appointment. Contact information: [address] [place] [id number] [place] [address] [phone]       Debbrah Alar, NP. Schedule an appointment as soon as possible for a visit in 1 week(s).   Specialty:  Internal Medicine Why:  Hospital follow-up Contact information: [address] [place] [address] [phone]          Allergies  Allergen Reactions  . Invokamet [Canagliflozin-Metformin Hcl] Other (See Comments)    Lactic acidosis  . Metformin And Related Other (See Comments)    DRASTIC drop in blood sugar    Consultations:  Neurology   GI    Procedures/Studies: Ct Angio Head W Or Wo Contrast  Result Date: 06/07/2017 CLINICAL DATA:  Patient woke with left arm weakness and numbness. EXAM: CT ANGIOGRAPHY HEAD AND NECK TECHNIQUE: Multidetector CT imaging of the head and neck was performed  using the standard protocol during bolus administration of intravenous contrast. Multiplanar CT image reconstructions and MIPs were obtained to evaluate the vascular anatomy. Carotid stenosis measurements (when applicable) are obtained utilizing NASCET criteria, using the distal internal carotid diameter as the  denominator. CONTRAST:  116m ISOVUE-370 IOPAMIDOL (ISOVUE-370) INJECTION 76% COMPARISON:  CTA of the neck 07/21/2014 FINDINGS: CTA NECK FINDINGS Aortic arch: Normal appearance.  Four vessel branching. Right carotid system: Mild tortuosity with partial retropharyngeal course. No atheromatous changes, beading, or stenosis. Left carotid system: Mild tortuosity with partial retropharyngeal course. No beading, stenosis, or atheromatous change. Vertebral arteries: No proximal subclavian stenosis. The left vertebral artery arises from the arch. Both vertebral arteries are smooth and diffusely patent. No atheromatous changes. Skeleton: No acute or aggressive finding. Other neck: No incidental mass or inflammation. Upper chest: Negative Review of the MIP images confirms the above findings CTA HEAD FINDINGS Diminished detail due to contrast timing and arterial density. Anterior circulation: No atheromatous changes, stenosis, occlusion, or aneurysm. Posterior circulation: Codominant vertebral arteries. Bilateral picas patent. Vertebral and basilar arteries are smooth and widely patent. Symmetric robust PCA flow. Venous sinuses: Patent Anatomic variants: None significant Delayed phase: No abnormal intracranial enhancement. Review of the MIP images confirms the above findings IMPRESSION: Negative CTA of the head and neck. Electronically Signed   By: JMonte FantasiaM.D.   On: 06/07/2017 11:31   Dg Chest 2 View  Result Date: 06/07/2017 CLINICAL DATA:  LEFT arm weakness, dry heaves, type II diabetes mellitus, GERD, hypertension EXAM: CHEST  2 VIEW COMPARISON:  03/26/2017 FINDINGS: Upper normal heart size. Mediastinal  contours and pulmonary vascularity normal. Lungs clear. No pleural effusion or pneumothorax. Bones unremarkable. IMPRESSION: No acute abnormalities. Electronically Signed   By: MLavonia DanaM.D.   On: 06/07/2017 20:05   Ct Angio Neck W And/or Wo Contrast  Result Date: 06/07/2017 CLINICAL DATA:  Patient woke with left arm weakness and numbness. EXAM: CT ANGIOGRAPHY HEAD AND NECK TECHNIQUE: Multidetector CT imaging of the head and neck was performed using the standard protocol during bolus administration of intravenous contrast. Multiplanar CT image reconstructions and MIPs were obtained to evaluate the vascular anatomy. Carotid stenosis measurements (when applicable) are obtained utilizing NASCET criteria, using the distal internal carotid diameter as the denominator. CONTRAST:  1274mISOVUE-370 IOPAMIDOL (ISOVUE-370) INJECTION 76% COMPARISON:  CTA of the neck 07/21/2014 FINDINGS: CTA NECK FINDINGS Aortic arch: Normal appearance.  Four vessel branching. Right carotid system: Mild tortuosity with partial retropharyngeal course. No atheromatous changes, beading, or stenosis. Left carotid system: Mild tortuosity with partial retropharyngeal course. No beading, stenosis, or atheromatous change. Vertebral arteries: No proximal subclavian stenosis. The left vertebral artery arises from the arch. Both vertebral arteries are smooth and diffusely patent. No atheromatous changes. Skeleton: No acute or aggressive finding. Other neck: No incidental mass or inflammation. Upper chest: Negative Review of the MIP images confirms the above findings CTA HEAD FINDINGS Diminished detail due to contrast timing and arterial density. Anterior circulation: No atheromatous changes, stenosis, occlusion, or aneurysm. Posterior circulation: Codominant vertebral arteries. Bilateral picas patent. Vertebral and basilar arteries are smooth and widely patent. Symmetric robust PCA flow. Venous sinuses: Patent Anatomic variants: None significant  Delayed phase: No abnormal intracranial enhancement. Review of the MIP images confirms the above findings IMPRESSION: Negative CTA of the head and neck. Electronically Signed   By: JoMonte Fantasia.D.   On: 06/07/2017 11:31   Mr Brain Wo Contrast  Result Date: 06/07/2017 CLINICAL DATA:  [age]/o  M; left arm weakness and numbness. EXAM: MRI HEAD WITHOUT CONTRAST MRA HEAD WITHOUT CONTRAST TECHNIQUE: Multiplanar, multiecho pulse sequences of the brain and surrounding structures were obtained without intravenous contrast. Angiographic images of the head were obtained using MRA technique without  contrast. COMPARISON:  06/07/2017 CT head and CTA head. FINDINGS: MRI HEAD FINDINGS Brain: No acute infarction, hemorrhage, hydrocephalus, extra-axial collection or mass lesion. No structural or significant signal abnormality of the brain. Vascular: As below. Skull and upper cervical spine: Normal marrow signal. Sinuses/Orbits: Mild sphenoid and left maxillary sinus mucosal thickening. No abnormal signal of mastoid air cells. Orbits are unremarkable. Other: None. MRA HEAD FINDINGS Internal carotid arteries:  Patent. Anterior cerebral arteries:  Patent. Middle cerebral arteries: Patent. Anterior communicating artery: Patent. Posterior communicating arteries:  Patent. Posterior cerebral arteries:  Patent. Basilar artery:  Patent. Vertebral arteries:  Patent. No evidence of high-grade stenosis, large vessel occlusion, or aneurysm. IMPRESSION: 1. Normal MRI of the brain. 2. Normal MRA of the head. Electronically Signed   By: Kristine Garbe M.D.   On: 06/07/2017 23:30   Mr Cervical Spine Wo Contrast  Result Date: 06/09/2017 CLINICAL DATA:  Left upper extremity weakness.  Neck pain. EXAM: MRI CERVICAL SPINE WITHOUT CONTRAST TECHNIQUE: Multiplanar, multisequence MR imaging of the cervical spine was performed. No intravenous contrast was administered. COMPARISON:  None. FINDINGS: Alignment: Physiologic. Vertebrae: No  fracture, evidence of discitis, or bone lesion. Cord: Normal signal and morphology. Posterior Fossa, vertebral arteries, paraspinal tissues: Disc levels: The disc spaces above C6 are normal. C6-C7: Small left subarticular/foraminal disc protrusion without foraminal stenosis. The C7-T1 and T1-T2 levels are normal. IMPRESSION: Small C6-7 left subarticular disc protrusion without foraminal stenosis. Electronically Signed   By: Ulyses Jarred M.D.   On: 06/09/2017 18:55   Dg Shoulder Left  Result Date: 06/08/2017 CLINICAL DATA:  Left arm weakness and numbness beginning at 6 a.m. yesterday. Decreased range of motion. EXAM: LEFT SHOULDER - 2+ VIEW COMPARISON:  Left shoulder radiographs 09/13/2015 FINDINGS: Degenerative changes of the left AC joint are stable. The clavicle is intact. The left shoulder is located. No acute bone or soft tissue abnormalities are present. The left hemithorax is clear. IMPRESSION: 1. No acute cardiopulmonary disease. 2. Stable degenerative changes at the left [facility] joint. Electronically Signed   By: San Morelle M.D.   On: 06/08/2017 16:17   Mr Jodene Nam Head Wo Contrast  Result Date: 06/07/2017 CLINICAL DATA:  42 y/o  M; left arm weakness and numbness. EXAM: MRI HEAD WITHOUT CONTRAST MRA HEAD WITHOUT CONTRAST TECHNIQUE: Multiplanar, multiecho pulse sequences of the brain and surrounding structures were obtained without intravenous contrast. Angiographic images of the head were obtained using MRA technique without contrast. COMPARISON:  06/07/2017 CT head and CTA head. FINDINGS: MRI HEAD FINDINGS Brain: No acute infarction, hemorrhage, hydrocephalus, extra-axial collection or mass lesion. No structural or significant signal abnormality of the brain. Vascular: As below. Skull and upper cervical spine: Normal marrow signal. Sinuses/Orbits: Mild sphenoid and left maxillary sinus mucosal thickening. No abnormal signal of mastoid air cells. Orbits are unremarkable. Other: None. MRA HEAD  FINDINGS Internal carotid arteries:  Patent. Anterior cerebral arteries:  Patent. Middle cerebral arteries: Patent. Anterior communicating artery: Patent. Posterior communicating arteries:  Patent. Posterior cerebral arteries:  Patent. Basilar artery:  Patent. Vertebral arteries:  Patent. No evidence of high-grade stenosis, large vessel occlusion, or aneurysm. IMPRESSION: 1. Normal MRI of the brain. 2. Normal MRA of the head. Electronically Signed   By: Kristine Garbe M.D.   On: 06/07/2017 23:30   Ct Head Code Stroke Wo Contrast  Result Date: 06/07/2017 CLINICAL DATA:  Code stroke. Left arm numbness and weakness since awakening. EXAM: CT HEAD WITHOUT CONTRAST TECHNIQUE: Contiguous axial images were obtained from the base of  the skull through the vertex without intravenous contrast. COMPARISON:  12/24/2014 FINDINGS: Brain: No evidence of acute infarction, hemorrhage, hydrocephalus, extra-axial collection or mass lesion/mass effect. Vascular: No hyperdense vessel or unexpected calcification. Skull: Normal. Negative for fracture or focal lesion. Sinuses/Orbits: No acute finding. Other: These results were called by telephone at the time of interpretation on 06/07/2017 at 11:01 am to Dr. Orlie Dakin , who verbally acknowledged these results. ASPECTS [facility] Stroke Program Early CT Score) - Ganglionic level infarction (caudate, lentiform nuclei, internal capsule, insula, M1-M3 cortex): 7 - Supraganglionic infarction (M4-M6 cortex): 3 Total score (0-10 with 10 being normal): 10 IMPRESSION: Negative head CT.  ASPECTS is 10. Electronically Signed   By: Monte Fantasia M.D.   On: 06/07/2017 11:02   ECHO - normal    Discharge Exam: Vitals:   06/10/17 0600 06/10/17 1348  BP: 122/88 136/87  Pulse: 93 88  Resp: 18 19  Temp: 98.3 F (36.8 C) 98.2 F (36.8 C)  SpO2: 100% 99%   Vitals:   06/10/17 0200 06/10/17 0500 06/10/17 0600 06/10/17 1348  BP: (!) 93/56  122/88 136/87  Pulse: (!) 107  93 88   Resp: '18  18 19  ' Temp: 98 F (36.7 C)  98.3 F (36.8 C) 98.2 F (36.8 C)  TempSrc: Oral  Oral Oral  SpO2: 100%  100% 99%  Weight:  98.6 kg (217 lb 6 oz)    Height:        General: Pt is alert, awake, not in acute distress Cardiovascular: RRR, S1/S2 +, no rubs, no gallops Respiratory: CTA bilaterally, no wheezing, no rhonchi Abdominal: Soft, NT, ND, bowel sounds + Extremities: strength on the left 4/5  The results of significant diagnostics from this hospitalization (including imaging, microbiology, ancillary and laboratory) are listed below for reference.     Microbiology: No results found for this or any previous visit (from the past 240 hour(s)).   Labs: BNP (last 3 results) No results for input(s): BNP in the last 8760 hours. Basic Metabolic Panel: Recent Labs  Lab 06/07/17 1044 06/08/17 0509 06/09/17 0226  NA 137 136 136  K 3.9 3.8 3.7  CL 100* 101 104  CO2 24 23 21*  GLUCOSE 88 80 338*  BUN 16 9 <5*  CREATININE 1.33* 0.96 1.00  CALCIUM 8.7* 8.7* 8.3*  MG  --   --  1.6*   Liver Function Tests: Recent Labs  Lab 06/07/17 1044 06/09/17 0226  AST 66* 113*  ALT 53 63  ALKPHOS 107 101  BILITOT 0.9 1.2  PROT 7.8 5.5*  ALBUMIN 3.7 2.8*   Recent Labs  Lab 06/08/17 1719  LIPASE 17  AMYLASE 101*   Recent Labs  Lab 06/08/17 1505 06/09/17 0226  AMMONIA 51* 30   CBC: Recent Labs  Lab 06/07/17 1044 06/08/17 0509 06/08/17 1505 06/09/17 0226  WBC 8.9 6.7  --  4.5  NEUTROABS 5.9  --   --   --   HGB 15.2 12.8*  --  11.1*  HCT 42.6 38.4* 39.4 32.8*  MCV 84.4 87.7  --  87.9  PLT 355 267  --  241   Cardiac Enzymes: Recent Labs  Lab 06/07/17 1044 06/07/17 2007  TROPONINI <0.03 <0.03   BNP: Invalid input(s): POCBNP CBG: Recent Labs  Lab 06/09/17 1343 06/09/17 1703 06/09/17 2121 06/10/17 0609 06/10/17 1137  GLUCAP 252* 217* 256* 148* 108*   D-Dimer No results for input(s): DDIMER in the last 72 hours. Hgb A1c Recent Labs  06/08/17 0509  HGBA1C 8.4*   Lipid Profile Recent Labs    06/08/17 0509  CHOL 109  HDL 58  LDLCALC 25  TRIG 132  CHOLHDL 1.9   Thyroid function studies No results for input(s): TSH, T4TOTAL, T3FREE, THYROIDAB in the last 72 hours.  Invalid input(s): FREET3 Anemia work up Recent Labs    06/08/17 1504 06/09/17 0226  VITAMINB12 783  --   FOLATE  --  29.4  FERRITIN  --  527*  TIBC  --  244*  IRON  --  120  RETICCTPCT  --  0.7   Urinalysis    Component Value Date/Time   COLORURINE YELLOW 03/26/2017 1917   APPEARANCEUR CLEAR 03/26/2017 1917   LABSPEC 1.020 03/26/2017 1917   PHURINE 5.5 03/26/2017 1917   GLUCOSEU >=500 (A) 03/26/2017 1917   HGBUR NEGATIVE 03/26/2017 [place] NEGATIVE 03/26/2017 1917   KETONESUR 40 (A) 03/26/2017 1917   PROTEINUR NEGATIVE 03/26/2017 1917   UROBILINOGEN 0.2 11/17/2014 1315   NITRITE NEGATIVE 03/26/2017 1917   LEUKOCYTESUR NEGATIVE 03/26/2017 1917   Sepsis Labs Invalid input(s): PROCALCITONIN,  WBC,  LACTICIDVEN Microbiology No results found for this or any previous visit (from the past 240 hour(s)).   Time coordinating discharge: 32 minutes  SIGNED:  Chipper Oman, MD  [facility] 06/10/2017, 2:37 PM  Pager please text page via  [email or web address]

## 2017-06-10 NOTE — Progress Notes (Signed)
NEUROHOSPITALISTS STROKE TEAM - DAILY PROGRESS NOTE   SUBJECTIVE (INTERVAL HISTORY) No family is at the bedside. Patient is found laying in bed in NAD. Overall he feels his condition is slowly improving. Voices no new complaints. No new events reported overnight. Patient continues to complain of left upper extremity weakness/numbness. Imaging results and POC reviewed with patient.   OBJECTIVE Lab Results: CBC:  Recent Labs  Lab 06/07/17 1044 06/08/17 0509 06/08/17 1505 06/09/17 0226  WBC 8.9 6.7  --  4.5  HGB 15.2 12.8*  --  11.1*  HCT 42.6 38.4* 39.4 32.8*  MCV 84.4 87.7  --  87.9  PLT 355 267  --  241   BMP: Recent Labs  Lab 06/07/17 1044 06/08/17 0509 06/09/17 0226  NA 137 136 136  K 3.9 3.8 3.7  CL 100* 101 104  CO2 24 23 21*  GLUCOSE 88 80 338*  BUN 16 9 <5*  CREATININE 1.33* 0.96 1.00  CALCIUM 8.7* 8.7* 8.3*  MG  --   --  1.6*   Liver Function Tests:  Recent Labs  Lab 06/07/17 1044 06/09/17 0226  AST 66* 113*  ALT 53 63  ALKPHOS 107 101  BILITOT 0.9 1.2  PROT 7.8 5.5*  ALBUMIN 3.7 2.8*   Recent Labs  Lab 06/08/17 1719  LIPASE 17  AMYLASE 101*   Recent Labs  Lab 06/08/17 1505 06/09/17 0226  AMMONIA 51* 30   Cardiac Enzymes:  Recent Labs  Lab 06/07/17 1044 06/07/17 2007  TROPONINI <0.03 <0.03   Coagulation Studies:  No results for input(s): APTT, INR in the last 72 hours. [place] Work -Up:  Recent Labs    06/08/17 1504 06/09/17 0226  VITAMINB12 783  --   FOLATE  --  29.4  IRON  --  120  RETICCTPCT  --  0.7   Urine Drug Screen:     Component Value Date/Time   LABOPIA NONE DETECTED 06/08/2017 1704   COCAINSCRNUR NONE DETECTED 06/08/2017 1704   LABBENZ POSITIVE (A) 06/08/2017 1704   AMPHETMU NONE DETECTED 06/08/2017 1704   THCU NONE DETECTED 06/08/2017 1704   LABBARB NONE DETECTED 06/08/2017 1704    Alcohol Level:  Recent Labs  Lab 06/08/17 1719  ETH <10    PHYSICAL  EXAM Temp:  [97.9 F (36.6 C)-98.8 F (37.1 C)] 98.2 F (36.8 C) (02/12 1348) Pulse Rate:  [88-107] 88 (02/12 1348) Resp:  [18-20] 19 (02/12 1348) BP: (93-137)/(56-88) 136/87 (02/12 1348) SpO2:  [99 %-100 %] 99 % (02/12 1348) Weight:  [98.6 kg (217 lb 6 oz)] 98.6 kg (217 lb 6 oz) (02/12 0500) General - Well nourished, well developed, in no apparent distress HEENT-  Normocephalic,  Cardiovascular - Regular rate and rhythm  Respiratory - Lungs clear bilaterally. No wheezing. Abdomen- All 4 quadrants palpated and nontender Musculoskeletal-left shoulder joint tenderness, no deformity or swelling, moves all extremities equally except for left arm Skin-warm and dry  Neurological Examination Mental Status: Alert, oriented, thought content appropriate.  Speech fluent without evidence of aphasia.  Able to follow 3 step commands without difficulty. Cranial Nerves: II: Discs flat bilaterally; Visual fields grossly normal,  III,IV, VI: ptosis not present, extra-ocular motions intact bilaterally pupils equal, round, reactive to light and accommodation V,VII: smile symmetric, facial light touch sensation normal bilaterally VIII: Hearing intact to voice IX,X: uvula rises symmetrically XI: bilateral shoulder shrug XII: midline tongue extension Motor: Decreased range of motion in left arm with shoulder pain with movement and left arm ataxia.  Normal left arm movements without ataxia when lying down. +Giveway on exam Right : Upper extremity   4+/5    Left:     Upper extremity   5/5  Lower extremity   5/5     Lower extremity   5/5 Tone and bulk:normal tone throughout; no atrophy noted Sensory: Pinprick and light touch decreased in left arm except hands.  Essential tremors left arm Deep Tendon Reflexes: 2+ and symmetric throughout Plantars: Right: downgoing   Left: downgoing Cerebellar: normal finger-to-nose, normal rapid alternating movements and normal heel-to-shin test Gait: normal gait and  station  IMAGING: I have personally reviewed the radiological images below and agree with the radiology interpretations.  Dg Shoulder Left Result Date: 06/08/2017  IMPRESSION: 1. No acute cardiopulmonary disease. 2. Stable degenerative changes at the left [facility] joint. Electronically Signed   By: San Morelle M.D.   On: 06/08/2017 16:17   MRI/ Mra Head Wo Contrast Result Date: 06/07/2017  IMPRESSION: 1. Normal MRI of the brain. 2. Normal MRA of the head. Electronically Signed   By: Kristine Garbe M.D.   On: 06/07/2017 23:30   CT ANGIOGRAPHY HEAD AND NECK  06/07/2017 11:31 IMPRESSION: Negative CTA of the head and neck.  MRI Cervical    IMPRESSION: Small C6-7 left subarticular disc protrusion without foraminal stenosis                                                  Echocardiogram:                                               Study Conclusions - Left ventricle: The cavity size was normal. Systolic function was   normal. The estimated ejection fraction was in the range of 55%   to 60%. Wall motion was normal; there were no regional wall   motion abnormalities. Doppler parameters are consistent with   abnormal left ventricular relaxation (grade 1 diastolic   dysfunction). Impressions:- No cardiac source of emboli was indentified.    IMPRESSION: 42 year old male with history of alcohol use, dermatitis, DM, HLD, HTN, OSA on CPAP, smoker admitted for left arm weakness with pain on movement with left arm numbness and discoordination.  Denies any left leg weakness or numbness, facial weakness or numbness, denies neck pain.  CT negative, CTA head and neck negative, MRI and MRA head negative.  2D echo pending.  A1c 8.4 and LDL 25.  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 Parsonage-Turner syndrome vs. cervical radiculopathy.  Will check MRI C-spine, and possible outpatient EMG/NCS.  Continue Lipitor and BP and DM management with home medication.  PT/OT  recommended outpatient OT.  Diabetic amyotrophy vs. Parsonage-Turner syndrome vs less likely TIA  Suspected Etiology: likely small vessel disease Resultant Symptoms: Left arm weakness and ataxia Stroke Risk Factors: diabetes mellitus, hyperlipidemia, hypertension and smoking Other Stroke Risk Factors: Family hx stroke, OSA  Outstanding Stroke Work-up Studies:    Work up completed at this time  06/10/2017 ASSESSMENT:   Neuro exam  Unchanged. Patient continues to complain of  Left sided weakness/numbness. MRI Cervical - no stenosis. ECHO No acute findings. Will need outpatient EMG. Will add Neurontin at HS. POC discussed with patient and Dr Elon Jester.  Patient likely to be discharged home later today.  PLAN  06/10/2017: Continue Statin, Add ASA 81 mg daily once cleared by GI/active GI bleed for stroke prevention Frequent neuro checks Neurontin 300 mg at [facility] Telemetry monitoring PT/OT/SLP Consult Case Management /MSW Ongoing aggressive stroke risk factor management Patient counseled to be compliant with his antithrombotic medications Patient counseled on Lifestyle modifications including, Diet, Exercise, and Stress Follow up with [facility] in 6 weeks, Outpatient EMG nerve conduction studies ordered. Dr Erlinda Hong to follow up results.   HYPERTENSION: Stable, Avoid Hypotension and Dehydration Long term BP goal normotensive. May slowly restart home B/P medications  Home Meds: Norvasc, Lisinopril  HYPERLIPIDEMIA:    Component Value Date/Time   CHOL 109 06/08/2017 0509   TRIG 132 06/08/2017 0509   HDL 58 06/08/2017 0509   CHOLHDL 1.9 06/08/2017 0509   VLDL 26 06/08/2017 0509   LDLCALC 25 06/08/2017 0509  Home Meds:  Lipitor 20 mg LDL  goal < 70 Continued on Lipitor to 20 mg daily Continue statin at discharge if LFT's stable, would continue to monitor closely  DIABETES: Lab Results  Component Value Date   HGBA1C 8.4 (H) 06/08/2017  HgbA1c goal < 7.0 Currently on:  Novolog Continue CBG monitoring and SSI to maintain glucose 140-180 mg/dl DM education   TOBACCO ABUSE Current smoker Smoking cessation counseling provided Nicotine patch provided  Other Active Problems: Active Problems:   Diabetes type 2, uncontrolled (HCC)   Essential hypertension   Depression with anxiety   Chronic pancreatitis (HCC)   Alcohol abuse   Left arm weakness   Anemia, blood loss   Bleeding internal hemorrhoids   Anemia of chronic disease    Hospital day # 3 VTE prophylaxis: SCD's  Diet : Fall precautions Diet Heart Room service appropriate? Yes; Fluid consistency: Thin Diet - low sodium heart healthy   FAMILY UPDATES:  No family at bedside  TEAM UPDATES: Doreatha Lew, MD   Prior Home Stroke Medications: No antithrombotic  Discharge Stroke Meds:  Please discharge patient on TBD   Disposition: 01-Home or Self Care Therapy Recs:               Outpatient therapies Follow Up:  Follow-up Information    Rosalin Hawking, MD. Schedule an appointment as soon as possible for a visit in 6 week(s).   Specialty:  Neurology Contact information: [address] [place] [address] [place] [facility].   Specialty:  Rehabilitation Why:  They will contact you for the first appointment. Contact information: [address] [place] [id number] [place] [address] [phone]       Debbrah Alar, NP. Schedule an appointment as soon as possible for a visit in 1 week(s).   Specialty:  Internal Medicine Why:  Hospital follow-up Contact information: [address] [place] [address] [phone]          Debbrah Alar, NP -PCP Follow up in 1-2 weeks      Assessment & plan discussed with with attending physician and they are in agreement.    Renie Ora Stroke Neurology Team 06/10/2017 2:42 PM   Attending  note: I reviewed above note and agree with the assessment and plan. I have made any additions or clarifications directly to the above note. Pt was seen and examined.   42 year old male with history of alcohol use, dermatitis, DM, HLD, HTN, OSA  on CPAP, smoker admitted for left arm weakness with pain on movement with left arm numbness and discoordination.  Denies any left leg weakness or numbness, facial weakness or numbness, denies neck pain.  CT negative, CTA head and neck negative, MRI and MRA head negative.  2D echo EF 55-60%.  A1c 8.4 and LDL 25. MRI C-spine no spinal cord compression.   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 Parsonage-Turner syndrome vs. cervical radiculopathy. Will do outpatient EMG/NCS.  Continue Lipitor and BP and DM management with home medication.  PT/OT recommended outpatient OT.  Neurology will sign off. Please call with questions. Pt will follow up with Dr. Erlinda Hong at [facility] in about 6 weeks. Thanks for the consult.  Rosalin Hawking, MD PhD Stroke Neurology 06/10/2017 11:32 PM    To contact Stroke Continuity provider, please refer to [email or web address] After hours, contact General Neurology

## 2017-06-10 NOTE — Progress Notes (Signed)
SLP Cancellation Note  Patient Details Name: Willie [name] MRN: [id number] DOB: [birth year]   Cancelled treatment:       Reason Eval/Treat Not Completed: SLP screened, no needs identified, will sign off   Cristine Daw 06/10/2017, 12:21 PM

## 2017-06-10 NOTE — Progress Notes (Signed)
Echocardiogram 2D Echocardiogram has been performed.  06/10/2017 12:55 PM Maudry Mayhew, BS, RVT, RDCS, RDMS

## 2017-06-11 ENCOUNTER — Telehealth: Payer: Self-pay | Admitting: *Deleted

## 2017-06-11 NOTE — Telephone Encounter (Signed)
Called patient and left message to return call

## 2017-07-01 ENCOUNTER — Other Ambulatory Visit: Payer: Self-pay

## 2017-07-01 ENCOUNTER — Encounter (HOSPITAL_BASED_OUTPATIENT_CLINIC_OR_DEPARTMENT_OTHER): Payer: Self-pay

## 2017-07-01 ENCOUNTER — Emergency Department (HOSPITAL_BASED_OUTPATIENT_CLINIC_OR_DEPARTMENT_OTHER)
Admission: EM | Admit: 2017-07-01 | Discharge: 2017-07-02 | Disposition: A | Discharge status: Home / Self Care | Payer: No Typology Code available for payment source | Attending: Emergency Medicine | Admitting: Emergency Medicine

## 2017-07-01 DIAGNOSIS — R112 Nausea with vomiting, unspecified: Secondary | ICD-10-CM

## 2017-07-01 DIAGNOSIS — E119 Type 2 diabetes mellitus without complications: Secondary | ICD-10-CM | POA: Insufficient documentation

## 2017-07-01 DIAGNOSIS — Z79899 Other long term (current) drug therapy: Secondary | ICD-10-CM | POA: Diagnosis not present

## 2017-07-01 DIAGNOSIS — E860 Dehydration: Secondary | ICD-10-CM | POA: Insufficient documentation

## 2017-07-01 DIAGNOSIS — Z794 Long term (current) use of insulin: Secondary | ICD-10-CM | POA: Diagnosis not present

## 2017-07-01 DIAGNOSIS — I10 Essential (primary) hypertension: Secondary | ICD-10-CM | POA: Diagnosis not present

## 2017-07-01 DIAGNOSIS — F17210 Nicotine dependence, cigarettes, uncomplicated: Secondary | ICD-10-CM | POA: Diagnosis not present

## 2017-07-01 HISTORY — DX: Alcohol abuse, uncomplicated: F10.10

## 2017-07-01 LAB — COMPREHENSIVE METABOLIC PANEL
ALT: 167 U/L — ABNORMAL HIGH (ref 17–63)
ANION GAP: 19 — AB (ref 5–15)
AST: 261 U/L — AB (ref 15–41)
Albumin: 3.9 g/dL (ref 3.5–5.0)
Alkaline Phosphatase: 146 U/L — ABNORMAL HIGH (ref 38–126)
BILIRUBIN TOTAL: 1.7 mg/dL — AB (ref 0.3–1.2)
BUN: 7 mg/dL (ref 6–20)
CO2: 23 mmol/L (ref 22–32)
Calcium: 8.6 mg/dL — ABNORMAL LOW (ref 8.9–10.3)
Chloride: 90 mmol/L — ABNORMAL LOW (ref 101–111)
Creatinine, Ser: 1.11 mg/dL (ref 0.61–1.24)
Glucose, Bld: 307 mg/dL — ABNORMAL HIGH (ref 65–99)
POTASSIUM: 4.3 mmol/L (ref 3.5–5.1)
Sodium: 132 mmol/L — ABNORMAL LOW (ref 135–145)
TOTAL PROTEIN: 7.6 g/dL (ref 6.5–8.1)

## 2017-07-01 LAB — URINALYSIS, ROUTINE W REFLEX MICROSCOPIC
Bilirubin Urine: NEGATIVE
Hgb urine dipstick: NEGATIVE
KETONES UR: 40 mg/dL — AB
LEUKOCYTES UA: NEGATIVE
NITRITE: NEGATIVE
PH: 7 (ref 5.0–8.0)
Protein, ur: 30 mg/dL — AB
SPECIFIC GRAVITY, URINE: 1.02 (ref 1.005–1.030)

## 2017-07-01 LAB — CBC
HEMATOCRIT: 38.2 % — AB (ref 39.0–52.0)
HEMOGLOBIN: 14.1 g/dL (ref 13.0–17.0)
MCH: 30.5 pg (ref 26.0–34.0)
MCHC: 36.9 g/dL — ABNORMAL HIGH (ref 30.0–36.0)
MCV: 82.7 fL (ref 78.0–100.0)
Platelets: 381 10*3/uL (ref 150–400)
RBC: 4.62 MIL/uL (ref 4.22–5.81)
RDW: 13 % (ref 11.5–15.5)
WBC: 11 10*3/uL — AB (ref 4.0–10.5)

## 2017-07-01 LAB — URINALYSIS, MICROSCOPIC (REFLEX): WBC UA: NONE SEEN WBC/hpf (ref 0–5)

## 2017-07-01 LAB — RAPID URINE DRUG SCREEN, HOSP PERFORMED
Amphetamines: NOT DETECTED
Barbiturates: NOT DETECTED
Benzodiazepines: NOT DETECTED
Cocaine: NOT DETECTED
OPIATES: NOT DETECTED
TETRAHYDROCANNABINOL: NOT DETECTED

## 2017-07-01 LAB — AMMONIA: AMMONIA: 41 umol/L — AB (ref 9–35)

## 2017-07-01 LAB — LIPASE, BLOOD: LIPASE: 19 U/L (ref 11–51)

## 2017-07-01 LAB — ETHANOL

## 2017-07-01 LAB — CBG MONITORING, ED: Glucose-Capillary: 287 mg/dL — ABNORMAL HIGH (ref 65–99)

## 2017-07-01 MED ORDER — SODIUM CHLORIDE 0.9 % IV BOLUS (SEPSIS)
1000.0000 mL | Freq: Once | INTRAVENOUS | Status: AC
  Administered 2017-07-01: 1000 mL via INTRAVENOUS

## 2017-07-01 MED ORDER — PANTOPRAZOLE SODIUM 40 MG IV SOLR
40.0000 mg | Freq: Once | INTRAVENOUS | Status: AC
  Administered 2017-07-01: 40 mg via INTRAVENOUS
  Filled 2017-07-01: qty 40

## 2017-07-01 MED ORDER — THIAMINE HCL 100 MG/ML IJ SOLN
Freq: Once | INTRAVENOUS | Status: DC
  Filled 2017-07-01: qty 1000

## 2017-07-01 MED ORDER — LORAZEPAM 2 MG/ML IJ SOLN
2.0000 mg | Freq: Once | INTRAMUSCULAR | Status: AC
  Administered 2017-07-01: 2 mg via INTRAVENOUS
  Filled 2017-07-01: qty 1

## 2017-07-01 MED ORDER — ONDANSETRON HCL 4 MG/2ML IJ SOLN
4.0000 mg | Freq: Once | INTRAMUSCULAR | Status: AC
  Administered 2017-07-01: 4 mg via INTRAVENOUS
  Filled 2017-07-01: qty 2

## 2017-07-01 NOTE — ED Provider Notes (Signed)
[facility] EMERGENCY DEPARTMENT Provider Note   CSN: [id number] Arrival date & time: 07/01/17  2104     History   Chief Complaint Chief Complaint  Patient presents with  . Nausea    HPI Willie [name] is a 42 y.o. male.  HPI Reports that he felt well yesterday.  He did his usual activities.  He reports he only drinks on his days off.  He reports he had 3 beers yesterday evening.  This morning he got up and felt very nauseated.  He reports he vomited.  He reports he was able to take his daughter to school as per usual.  He reports once he got home he vomited again.  He reports since then every time he moves around even a little bit, he starts vomiting.  He estimates he is thrown up at least 15 times.  No diarrhea.  He reports diffuse abdominal cramping.  Denies fever.  He denies that this feels similar to prior episodes of pancreatitis.  Patient denies he experiences significant alcohol withdrawal symptoms.  He denies any history of prior seizure.  Patient denies marijuana use or any other drug use. Past Medical History:  Diagnosis Date  . Alcohol abuse   . Alcoholic pancreatitis    recurrent  . Depression   . Diabetes mellitus, type II ([place])    New Onset 03/2010  . GERD (gastroesophageal reflux disease)   . High cholesterol   . 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 Hypertension   . Sleep apnea    does not wear a CPAP    Patient Active Problem List   Diagnosis Date Noted  . Pain   . Bleeding internal hemorrhoids   . Anemia of chronic disease   . Left arm weakness   . Anemia, blood loss   . Vomiting alone 03/27/2017  . Chest pain 03/27/2017  . Nausea and vomiting   . Lactic acidosis 03/26/2017  . History of alcohol abuse 06/25/2016  . Tobacco abuse 06/25/2016  . Anxiety state 11/20/2015  . Dental infection 07/10/2015  . Mood disorder ([place]) 04/18/2015  . Eczema 06/27/2014  . GERD (gastroesophageal  reflux disease) 05/24/2014  . Snoring 04/07/2013  . Suicide attempt by substance overdose ([place]) 02/10/2013  . Alcohol abuse 12/31/2012  . Erectile dysfunction 12/09/2012  . Hematuria 06/11/2012  . Vision problem 02/02/2012  . Insomnia 12/20/2011  . Chronic pancreatitis ([place]) 02/05/2011  . Depression with anxiety 11/13/2010  . Hyperlipemia 06/27/2010  . Diabetes type 2, uncontrolled ([place]) 05/22/2010  . Essential hypertension 05/22/2010    Past Surgical History:  Procedure Laterality Date  . COLONOSCOPY W/ BIOPSIES  09/2010   Dr. Ardis Hughs.  for intermittent rectal bleeding.  Mild sigmoid to descending diverticulosis.  Mild left colon erythema, benign biopsy, probably "prep effect"  . LUMBAR MICRODISCECTOMY  ~ 2004   Dr Ellene Route       Home Medications    Prior to Admission medications   Medication Sig Start Date End Date Taking? Authorizing Provider  amLODipine (NORVASC) 10 MG tablet Take 1 tablet (10 mg total) by mouth daily. 02/27/17   Debbrah Alar, NP  atorvastatin (LIPITOR) 20 MG tablet Take 1 tablet (20 mg total) by mouth daily. 02/27/17   Debbrah Alar, NP  Blood Glucose Monitoring Suppl (FREESTYLE FREEDOM LITE) w/Device KIT 2 (two) times daily 02/27/17   Debbrah Alar, NP  cloNIDine (CATAPRES) 0.1 MG tablet Take 1 tablet (  0.1 mg total) by mouth 3 (three) times daily. 02/27/17   Debbrah Alar, NP  escitalopram (LEXAPRO) 20 MG tablet Take 1 tablet (20 mg total) by mouth daily. 02/27/17   Debbrah Alar, NP  folic acid (FOLVITE) 1 MG tablet Take 1 tablet (1 mg total) by mouth daily. 06/11/17   Doreatha Lew, MD  gabapentin (NEURONTIN) 300 MG capsule Take 1 capsule (300 mg total) by mouth at bedtime. 06/10/17   Doreatha Lew, MD  glucose blood (FREESTYLE LITE) test strip Use as instructed 02/27/17   Debbrah Alar, NP  hydrocortisone (ANUSOL-HC) 25 MG suppository Place 1 suppository (25 mg total) rectally 2 (two) times daily. 06/10/17   Doreatha Lew, MD  hydrOXYzine (ATARAX/VISTARIL) 50 MG tablet Take 1 tablet (50 mg total) by mouth 3 (three) times daily as needed. Patient taking differently: Take 50 mg by mouth 3 (three) times daily as needed for anxiety.  02/27/17   Debbrah Alar, NP  Insulin Glargine (LANTUS SOLOSTAR) 100 UNIT/ML Solostar Pen Inject 40 Units into the skin daily at 10 pm. Patient taking differently: Inject 35 Units into the skin at bedtime.  02/27/17   Debbrah Alar, NP  insulin lispro (HUMALOG KWIKPEN) 100 UNIT/ML KiwkPen 3 times a day (just before each meal) 09-10-08 units, Patient taking differently: Inject 5-15 Units into the skin See admin instructions. 15 units into the skin with breakfast then 5 units with lunch then 15 units with dinner (evening meal) 02/27/17   Debbrah Alar, NP  Insulin Pen Needle (ULTICARE SHORT PEN NEEDLES) 31G X 8 MM MISC Use once daily to inject insulin. 02/27/17   Debbrah Alar, NP  Lancets (FREESTYLE) lancets 2 (two) times daily 02/27/17   Debbrah Alar, NP  lisinopril (PRINIVIL,ZESTRIL) 20 MG tablet TAKE 1 TABLET (20 MG TOTAL) BY MOUTH DAILY. Patient taking differently: Take 20 mg by mouth daily.  02/27/17   Debbrah Alar, NP  Multiple Vitamins-Minerals (MULTIVITAMIN) tablet Take 1 tablet by mouth daily. 02/12/16   Palumbo, April, MD  ondansetron (ZOFRAN ODT) 8 MG disintegrating tablet Take 1 tablet (8 mg total) by mouth every 8 (eight) hours as needed for nausea or vomiting. 03/27/17   Barton Dubois, MD  pantoprazole (PROTONIX) 40 MG tablet Take 1 tablet (40 mg total) by mouth 2 (two) times daily. 03/27/17   Barton Dubois, MD  potassium chloride (K-DUR,KLOR-CON) 10 MEQ tablet Take 2 tablets (20 mEq total) by mouth daily. 02/27/17   Debbrah Alar, NP  QUEtiapine (SEROQUEL) 300 MG tablet Take 1 tablet (300 mg total) by mouth at bedtime. 02/27/17   Debbrah Alar, NP  thiamine 100 MG tablet Take 1 tablet (100 mg total) by mouth daily. 02/27/17    Debbrah Alar, NP    Family History Family History  Problem Relation Age of Onset  . Diabetes Mother   . Lung cancer Brother        twin brother  . Pancreatic cancer Paternal Aunt   . Colon cancer Neg Hx   . Stomach cancer Neg Hx     Social History Social History   Tobacco Use  . Smoking status: Current Every Day Smoker    Packs/day: 0.25    Years: 1.00    Pack years: 0.25    Types: Cigarettes    Start date: 04/29/2016  . Smokeless tobacco: Never Used  . Tobacco comment: 4 Cigarettes a day   Substance Use Topics  . Alcohol use: Yes  . Drug use: No    Comment: none in  years     Allergies   Invokamet [canagliflozin-metformin hcl] and Metformin and related   Review of Systems Review of Systems 10 Systems reviewed and are negative for acute change except as noted in the HPI.   Physical Exam Updated Vital Signs BP (!) 146/100   Pulse (!) 132   Temp 98.6 F (37 C) (Oral)   Resp 20   Ht 6' (1.829 m)   Wt 90.7 kg (200 lb)   SpO2 99%   BMI 27.12 kg/m   Physical Exam  Constitutional: He is oriented to person, place, and time.  Patient is alert but very uncomfortable looking.  Mental status is clear.  He is slightly tremulous.  No respiratory distress.  HENT:  Because membranes slightly dry.  Posterior oropharynx widely patent.  Eyes: EOM are normal. Pupils are equal, round, and reactive to light.  Neck: Neck supple.  Cardiovascular:  Tachycardia.  No gross rub murmur gallop.  Pulmonary/Chest: Effort normal and breath sounds normal.  Abdominal: Soft.  Patient endorses diffuse tenderness but no guarding.  He reports palpation makes him more nauseated.  Musculoskeletal: Normal range of motion. He exhibits no edema or tenderness.  Neurological: He is alert and oriented to person, place, and time. No cranial nerve deficit. He exhibits normal muscle tone. Coordination normal.  Patient is slightly tremulous.  No asterixis.  Mental status is clear.  He does  seem anxious.  Skin: Skin is warm and dry.     ED Treatments / Results  Labs (all labs ordered are listed, but only abnormal results are displayed) Labs Reviewed  COMPREHENSIVE METABOLIC PANEL - Abnormal; Notable for the following components:      Result Value   Sodium 132 (*)    Chloride 90 (*)    Glucose, Bld 307 (*)    Calcium 8.6 (*)    AST 261 (*)    ALT 167 (*)    Alkaline Phosphatase 146 (*)    Total Bilirubin 1.7 (*)    Anion gap 19 (*)    All other components within normal limits  CBC - Abnormal; Notable for the following components:   WBC 11.0 (*)    HCT 38.2 (*)    MCHC 36.9 (*)    All other components within normal limits  CBG MONITORING, ED - Abnormal; Notable for the following components:   Glucose-Capillary 287 (*)    All other components within normal limits  LIPASE, BLOOD  URINALYSIS, ROUTINE W REFLEX MICROSCOPIC  ETHANOL  RAPID URINE DRUG SCREEN, HOSP PERFORMED  AMMONIA    EKG  EKG Interpretation None       Radiology No results found.  Procedures Procedures (including critical care time)  Medications Ordered in ED Medications  sodium chloride 0.9 % 1,000 mL with thiamine 027 mg, folic acid 1 mg, multivitamins adult 10 mL infusion (not administered)  sodium chloride 0.9 % bolus 1,000 mL (not administered)  LORazepam (ATIVAN) injection 2 mg (not administered)  ondansetron (ZOFRAN) injection 4 mg (not administered)  pantoprazole (PROTONIX) injection 40 mg (not administered)     Initial Impression / Assessment and Plan / ED Course  I have reviewed the triage vital signs and the nursing notes.  Pertinent labs & imaging results that were available during my care of the patient were reviewed by me and considered in my medical decision making (see chart for details).     Recheck 23: 40 patient reports he feels improved.  Nausea has now resolved.  He is tolerating  some ice chips.  Reports that he still feels some abdominal cramping.   Generally looks much better.  Patient is relaxed and shows no signs of acute distress.  Patient does remain tachycardic with heart rate at 125.  Continue hydration and symptom control. Final Clinical Impressions(s) / ED Diagnoses   Final diagnoses:  Dehydration  Nausea and vomiting, intractability of vomiting not specified, unspecified vomiting type  Patient had acute onset of recurrent vomiting.  Diffuse cramping abdominal pain.  Abdomen is nonsurgical.  No associated diarrhea.  Patient denies any prior alcohol withdrawal or withdrawal seizures.  He reports that he only drinks on days off.  He denies other drug use.  Denies symptoms are similar to pancreatitis.  Lipase not significantly elevated.  Patient has had significant improvement with combination of fluids, Zofran, Ativan and Protonix.  This time we will continue hydration and symptom control.  Patient will be reassessed by Dr. Leonides Schanz.  Patient continues to have good clinical response with symptomatic control I feel patient will be stable for discharge.  He appears to have chronic baseline tachycardia around 90s to low 100s.  He does not have other symptoms of chest pain dyspnea fever or cough.    ED Discharge Orders    None       Charlesetta Shanks, MD 07/04/17 [phone]

## 2017-07-01 NOTE — ED Triage Notes (Signed)
N/V/abdominal cramping onset this AM. Pt reports similar with hyperglycemia. EMS reports CBG 339. Pt reports not taking any meds for several days. Pt received 4mg  zofran PTA.

## 2017-07-01 NOTE — ED Notes (Signed)
Pt admits to drinking "3-4 alcoholic beverages every night". Pt last drink was last night before he "passed out".

## 2017-07-02 LAB — CBG MONITORING, ED: GLUCOSE-CAPILLARY: 207 mg/dL — AB (ref 65–99)

## 2017-07-02 MED ORDER — SODIUM CHLORIDE 0.9 % IV BOLUS (SEPSIS)
1000.0000 mL | Freq: Once | INTRAVENOUS | Status: AC
  Administered 2017-07-02: 1000 mL via INTRAVENOUS

## 2017-07-02 MED ORDER — DICYCLOMINE HCL 10 MG/ML IM SOLN
20.0000 mg | Freq: Once | INTRAMUSCULAR | Status: AC
  Administered 2017-07-02: 20 mg via INTRAMUSCULAR
  Filled 2017-07-02: qty 2

## 2017-07-02 MED ORDER — ONDANSETRON 4 MG PO TBDP: 4.0000 mg | ORAL_TABLET | Freq: Four times a day (QID) | ORAL | 0 refills | Status: DC | PRN

## 2017-07-02 MED ORDER — LORAZEPAM 2 MG/ML IJ SOLN
1.0000 mg | Freq: Once | INTRAMUSCULAR | Status: AC
  Administered 2017-07-02: 1 mg via INTRAVENOUS
  Filled 2017-07-02: qty 1

## 2017-07-02 MED ORDER — CLONIDINE HCL 0.1 MG PO TABS
0.1000 mg | ORAL_TABLET | Freq: Once | ORAL | Status: AC
  Administered 2017-07-02: 0.1 mg via ORAL
  Filled 2017-07-02: qty 1

## 2017-07-02 MED ORDER — PROMETHAZINE HCL 25 MG/ML IJ SOLN
25.0000 mg | Freq: Once | INTRAMUSCULAR | Status: AC
  Administered 2017-07-02: 25 mg via INTRAVENOUS
  Filled 2017-07-02: qty 1

## 2017-07-02 MED ORDER — IBUPROFEN 800 MG PO TABS
800.0000 mg | ORAL_TABLET | Freq: Once | ORAL | Status: AC
  Administered 2017-07-02: 800 mg via ORAL
  Filled 2017-07-02: qty 1

## 2017-07-02 MED ORDER — DICYCLOMINE HCL 20 MG PO TABS: 20.0000 mg | ORAL_TABLET | Freq: Three times a day (TID) | ORAL | 0 refills | Status: DC | PRN

## 2017-07-02 MED FILL — ZOLPIDEM TARTRATE 5 MG TABL: 5 | 30 days supply | Qty: 30 | Fill #3

## 2017-07-02 MED FILL — PANTOPRAZOLE SOD DR 40 MG T: 40 | 90 days supply | Qty: 90 | Fill #0

## 2017-07-02 MED FILL — QUETIAPINE 300 MG TABLET: 300 | 90 days supply | Qty: 90 | Fill #0 | Status: TO

## 2017-07-02 NOTE — ED Provider Notes (Signed)
12:00 AM  Assumed care from Dr. Johnney Killian.  Patient is a 42 year old male with history of alcohol abuse who presents today with nausea and vomiting.  Patient continues to be tachycardic here but this appears to be something that he has frequently.  He is not on a beta-blocker.  Concern for possible alcohol withdrawal but clinically he appears better per previous physician.  Plan is to give third liter of IV fluids, repeat dose of Ativan and fluid challenge patient.  2:40 AM  Pt reports no alcohol in 3 days.  States he has a history of anxiety which is normally helped with clonidine.  States that his normal heart rate is in the 120s.  He is not on a beta-blocker.  No longer vomiting.  Has received 3 L of fluids and is drinking without difficulty.  Heart rate is slowly improving into the 110's.  Does have a low-grade oral temperature of 99.  This could also be a viral illness.  Complains of diffuse abdominal cramping.  Will give Bentyl.  No diarrhea.  Will give ibuprofen for his pain and low-grade temperature.  Ordered his home clonidine as well as he states this helps with anxiety and we will continue to monitor his heart rate.  3:30 AM  Pt's HR is now 108.  Patient reports feeling better.  Blood glucose 207.  Again no longer vomiting.  Have encouraged him to take his medications at home and avoid alcohol.  Recommend bland diet for the next several days.  Recommended increase fluid intake.  Abdominal exam is benign.  Will discharge with Zofran and Bentyl.  No sign of alcohol withdrawal at this time.  No hallucinations, tremors, seizures.  Suspect tachycardia is from dehydration and from chronic sinus tachycardia.  He denies any chest pain or shortness of breath.  At this time, I do not feel there is any life-threatening condition present. I have reviewed and discussed all results (EKG, imaging, lab, urine as appropriate) and exam findings with patient/family. I have reviewed nursing notes and appropriate  previous records.  I feel the patient is safe to be discharged home without further emergent workup and can continue workup as an outpatient as needed. Discussed usual and customary return precautions. Patient/family verbalize understanding and are comfortable with this plan.  Outpatient follow-up has been provided if needed. All questions have been answered.    Atziri Zubiate, Delice Bison, DO 07/02/17 [phone]

## 2017-07-02 NOTE — Discharge Instructions (Signed)
I recommended to avoid alcohol and eat a bland diet for the next several days.  Please drink 60-80 ounces of water a day.

## 2017-07-03 ENCOUNTER — Other Ambulatory Visit: Payer: Self-pay | Admitting: Family

## 2017-07-03 ENCOUNTER — Encounter: Payer: Self-pay | Admitting: Gastroenterology

## 2017-07-03 ENCOUNTER — Ambulatory Visit: Payer: Self-pay | Admitting: *Deleted

## 2017-07-03 ENCOUNTER — Telehealth: Payer: Self-pay

## 2017-07-03 MED FILL — cloNIDine HCL 0.1 MG TABS: 0.1 | 90 days supply | Qty: 270 | Fill #1

## 2017-07-03 MED FILL — LISINOPRIL 20 MG TABLET: 20 | 90 days supply | Qty: 90 | Fill #1

## 2017-07-03 NOTE — Telephone Encounter (Signed)
Pt reports diarrhea "15 times today" some form at times, other times watery. "Easing up now, haven't gone in a while." Four of those times he reports bright red blood present. States on tissue with occasional small dark clot.States difficult to tell how much in commode. Also reports he has bleeding through to his pants; "2 inch around" in size. States was to have hemorrhoid banding surgery done today but was cancelled due to hospital admission 07/01/17 "dehydration" from vomiting.  Denies any abdominal pain, dizziness. No further vomiting. States has had similar bleeding from hemorrhoids in past. Appt made  For tomorrow with M. Inda Castle. Pt directed to ED if symptoms worsen, increased bleeding, clots, abdominal pain or dizziness. Reason for Disposition . MODERATE rectal bleeding (small blood clots, passing blood without stool, or toilet water turns red)  Answer Assessment - Initial Assessment Questions 1. APPEARANCE of BLOOD: "What color is it?" "Is it passed separately, on the surface of the stool, or mixed in with the stool?"      Bright red 2. AMOUNT: "How much blood was passed?"      On tissue. "Hard to tell in commode." Spot on pants  About 2 in.round. 3. FREQUENCY: "How many times has blood been passed with the stools?"      4 times 4. ONSET: "When was the blood first seen in the stools?" (Days or weeks)      Today 5. DIARRHEA: "Is there also some diarrhea?" If so, ask: "How many diarrhea stools were passed in past 24 hours?"     Every 20 minutes "slowed down now."  15 times today. Watery at times,     Some form at times.  6. CONSTIPATION: "Do you have constipation?" If so, "How bad is it?"     No 7. RECURRENT SYMPTOMS: "Have you had blood in your stools before?" If so, ask: "When was the last time?" and "What happened that time?"      Banding of hemorroids scheduled for today. Not done 8. BLOOD THINNERS: "Do you take any blood thinners?" (e.g., Coumadin/warfarin, Pradaxa/dabigatran,  aspirin)     No 9. OTHER SYMPTOMS: "Do you have any other symptoms?"  (e.g., abdominal pain, vomiting, dizziness, fever)     No  Protocols used: RECTAL BLEEDING-A-AH

## 2017-07-03 NOTE — Telephone Encounter (Signed)
Called and LM for pt.  Wanting to see how he is feeling and if he feels up to coming for banding appt today at 4:00pm.  If he is infectious we need to reschedule him.

## 2017-07-03 NOTE — Telephone Encounter (Signed)
Patient called back states he is still feeling weak and was not feeling well enough to come in. Patient rescheduled banding to 08/19/17 at 3:45pm first available.

## 2017-07-03 NOTE — Telephone Encounter (Signed)
-----   Message from Yetta Flock, MD sent at 07/02/2017  5:11 PM EST ----- Regarding: RE: pt coming tomorrow for banding Jan I think we should call him tomorrow morning and see how he is doing. If he has something infectious I would cancel and can add him on in the next 1-2 weeks.    ----- Message ----- From: Roetta Sessions, CMA Sent: 07/02/2017   4:39 PM To: Yetta Flock, MD Subject: pt coming tomorrow for banding                 FYI -a Dr. Eugenia Pancoast pt is scheduled for his 1st banding tomorrow afternoon (appt scheduled with Dr. Carlean Purl ? ;-). He was at the ED yesterday for dehydration, nausea & vomiting.  Do you think it is advisable for him to come for a banding appt tomorrow? Thank you.   Jan

## 2017-07-04 ENCOUNTER — Inpatient Hospital Stay (HOSPITAL_BASED_OUTPATIENT_CLINIC_OR_DEPARTMENT_OTHER)
Admission: EM | Admit: 2017-07-04 | Discharge: 2017-07-07 | DRG: 378 | Disposition: A | Discharge status: Home / Self Care | Payer: No Typology Code available for payment source | Attending: Internal Medicine | Admitting: Internal Medicine

## 2017-07-04 ENCOUNTER — Emergency Department (HOSPITAL_BASED_OUTPATIENT_CLINIC_OR_DEPARTMENT_OTHER): Payer: No Typology Code available for payment source

## 2017-07-04 ENCOUNTER — Other Ambulatory Visit: Payer: Self-pay

## 2017-07-04 ENCOUNTER — Encounter: Payer: Self-pay | Admitting: Family

## 2017-07-04 ENCOUNTER — Encounter (HOSPITAL_BASED_OUTPATIENT_CLINIC_OR_DEPARTMENT_OTHER): Payer: Self-pay | Admitting: Emergency Medicine

## 2017-07-04 ENCOUNTER — Ambulatory Visit (INDEPENDENT_AMBULATORY_CARE_PROVIDER_SITE_OTHER): Payer: No Typology Code available for payment source | Admitting: Family

## 2017-07-04 VITALS — BP 146/104 | HR 121 | Temp 98.6°F | Resp 16 | Ht 72.0 in | Wt 210.2 lb

## 2017-07-04 DIAGNOSIS — D124 Benign neoplasm of descending colon: Secondary | ICD-10-CM | POA: Diagnosis present

## 2017-07-04 DIAGNOSIS — Z9111 Patient's noncompliance with dietary regimen: Secondary | ICD-10-CM

## 2017-07-04 DIAGNOSIS — E1165 Type 2 diabetes mellitus with hyperglycemia: Secondary | ICD-10-CM | POA: Diagnosis present

## 2017-07-04 DIAGNOSIS — K922 Gastrointestinal hemorrhage, unspecified: Secondary | ICD-10-CM | POA: Diagnosis not present

## 2017-07-04 DIAGNOSIS — E876 Hypokalemia: Secondary | ICD-10-CM | POA: Diagnosis present

## 2017-07-04 DIAGNOSIS — R710 Precipitous drop in hematocrit: Secondary | ICD-10-CM | POA: Diagnosis not present

## 2017-07-04 DIAGNOSIS — K219 Gastro-esophageal reflux disease without esophagitis: Secondary | ICD-10-CM | POA: Diagnosis present

## 2017-07-04 DIAGNOSIS — Z9889 Other specified postprocedural states: Secondary | ICD-10-CM

## 2017-07-04 DIAGNOSIS — Z833 Family history of diabetes mellitus: Secondary | ICD-10-CM | POA: Diagnosis not present

## 2017-07-04 DIAGNOSIS — K5731 Diverticulosis of large intestine without perforation or abscess with bleeding: Secondary | ICD-10-CM | POA: Diagnosis present

## 2017-07-04 DIAGNOSIS — D500 Iron deficiency anemia secondary to blood loss (chronic): Secondary | ICD-10-CM | POA: Diagnosis present

## 2017-07-04 DIAGNOSIS — K860 Alcohol-induced chronic pancreatitis: Secondary | ICD-10-CM | POA: Diagnosis present

## 2017-07-04 DIAGNOSIS — R109 Unspecified abdominal pain: Secondary | ICD-10-CM

## 2017-07-04 DIAGNOSIS — K921 Melena: Secondary | ICD-10-CM | POA: Diagnosis present

## 2017-07-04 DIAGNOSIS — Z800 Family history of malignant neoplasm of digestive organs: Secondary | ICD-10-CM | POA: Diagnosis not present

## 2017-07-04 DIAGNOSIS — F1020 Alcohol dependence, uncomplicated: Secondary | ICD-10-CM | POA: Diagnosis present

## 2017-07-04 DIAGNOSIS — E7800 Pure hypercholesterolemia, unspecified: Secondary | ICD-10-CM | POA: Diagnosis present

## 2017-07-04 DIAGNOSIS — K4090 Unilateral inguinal hernia, without obstruction or gangrene, not specified as recurrent: Secondary | ICD-10-CM | POA: Diagnosis present

## 2017-07-04 DIAGNOSIS — K529 Noninfective gastroenteritis and colitis, unspecified: Secondary | ICD-10-CM

## 2017-07-04 DIAGNOSIS — K648 Other hemorrhoids: Secondary | ICD-10-CM | POA: Diagnosis present

## 2017-07-04 DIAGNOSIS — K760 Fatty (change of) liver, not elsewhere classified: Secondary | ICD-10-CM | POA: Diagnosis present

## 2017-07-04 DIAGNOSIS — Z794 Long term (current) use of insulin: Secondary | ICD-10-CM

## 2017-07-04 DIAGNOSIS — K625 Hemorrhage of anus and rectum: Secondary | ICD-10-CM

## 2017-07-04 DIAGNOSIS — Z801 Family history of malignant neoplasm of trachea, bronchus and lung: Secondary | ICD-10-CM

## 2017-07-04 DIAGNOSIS — K429 Umbilical hernia without obstruction or gangrene: Secondary | ICD-10-CM | POA: Diagnosis present

## 2017-07-04 DIAGNOSIS — F1010 Alcohol abuse, uncomplicated: Secondary | ICD-10-CM | POA: Diagnosis present

## 2017-07-04 DIAGNOSIS — R7401 Elevation of levels of liver transaminase levels: Secondary | ICD-10-CM | POA: Diagnosis present

## 2017-07-04 DIAGNOSIS — R740 Nonspecific elevation of levels of transaminase and lactic acid dehydrogenase [LDH]: Secondary | ICD-10-CM

## 2017-07-04 DIAGNOSIS — F329 Major depressive disorder, single episode, unspecified: Secondary | ICD-10-CM | POA: Diagnosis present

## 2017-07-04 DIAGNOSIS — I10 Essential (primary) hypertension: Secondary | ICD-10-CM | POA: Diagnosis present

## 2017-07-04 DIAGNOSIS — D62 Acute posthemorrhagic anemia: Secondary | ICD-10-CM | POA: Diagnosis present

## 2017-07-04 DIAGNOSIS — Z87891 Personal history of nicotine dependence: Secondary | ICD-10-CM | POA: Diagnosis not present

## 2017-07-04 DIAGNOSIS — K5730 Diverticulosis of large intestine without perforation or abscess without bleeding: Secondary | ICD-10-CM | POA: Diagnosis not present

## 2017-07-04 DIAGNOSIS — Z86010 Personal history of colonic polyps: Secondary | ICD-10-CM

## 2017-07-04 DIAGNOSIS — K759 Inflammatory liver disease, unspecified: Secondary | ICD-10-CM | POA: Diagnosis present

## 2017-07-04 DIAGNOSIS — IMO0002 Reserved for concepts with insufficient information to code with codable children: Secondary | ICD-10-CM | POA: Diagnosis present

## 2017-07-04 DIAGNOSIS — R945 Abnormal results of liver function studies: Secondary | ICD-10-CM | POA: Diagnosis not present

## 2017-07-04 DIAGNOSIS — R933 Abnormal findings on diagnostic imaging of other parts of digestive tract: Secondary | ICD-10-CM | POA: Diagnosis not present

## 2017-07-04 DIAGNOSIS — K861 Other chronic pancreatitis: Secondary | ICD-10-CM | POA: Diagnosis present

## 2017-07-04 LAB — I-STAT CG4 LACTIC ACID, ED: Lactic Acid, Venous: 1.51 mmol/L (ref 0.5–1.9)

## 2017-07-04 LAB — CBC
HCT: 33.9 % — ABNORMAL LOW (ref 39.0–52.0)
HEMATOCRIT: 35.1 % — AB (ref 39.0–52.0)
HEMOGLOBIN: 11.7 g/dL — AB (ref 13.0–17.0)
Hemoglobin: 12.4 g/dL — ABNORMAL LOW (ref 13.0–17.0)
MCH: 30.5 pg (ref 26.0–34.0)
MCH: 30.3 pg (ref 26.0–34.0)
MCHC: 35.3 g/dL (ref 30.0–36.0)
MCHC: 34.5 g/dL (ref 30.0–36.0)
MCV: 86.5 fL (ref 78.0–100.0)
MCV: 87.8 fL (ref 78.0–100.0)
Platelets: 215 10*3/uL (ref 150–400)
Platelets: 192 10*3/uL (ref 150–400)
RBC: 4.06 MIL/uL — AB (ref 4.22–5.81)
RBC: 3.86 MIL/uL — AB (ref 4.22–5.81)
RDW: 13.2 % (ref 11.5–15.5)
RDW: 13.7 % (ref 11.5–15.5)
WBC: 8 10*3/uL (ref 4.0–10.5)
WBC: 7.2 10*3/uL (ref 4.0–10.5)

## 2017-07-04 LAB — OCCULT BLOOD X 1 CARD TO LAB, STOOL: Fecal Occult Bld: POSITIVE — AB

## 2017-07-04 LAB — POCT HEMOGLOBIN: HEMOGLOBIN: 12.3 g/dL — AB (ref 14.1–18.1)

## 2017-07-04 LAB — COMPREHENSIVE METABOLIC PANEL
ALT: 178 U/L — AB (ref 17–63)
AST: 220 U/L — AB (ref 15–41)
Albumin: 3.7 g/dL (ref 3.5–5.0)
Alkaline Phosphatase: 124 U/L (ref 38–126)
Anion gap: 8 (ref 5–15)
BUN: 7 mg/dL (ref 6–20)
CHLORIDE: 100 mmol/L — AB (ref 101–111)
CO2: 27 mmol/L (ref 22–32)
CREATININE: 0.84 mg/dL (ref 0.61–1.24)
Calcium: 8.6 mg/dL — ABNORMAL LOW (ref 8.9–10.3)
GFR calc Af Amer: >60 mL/min (ref >60)
Glucose, Bld: 115 mg/dL — ABNORMAL HIGH (ref 65–99)
POTASSIUM: 3.9 mmol/L (ref 3.5–5.1)
SODIUM: 135 mmol/L (ref 135–145)
Total Bilirubin: 0.6 mg/dL (ref 0.3–1.2)
Total Protein: 6.9 g/dL (ref 6.5–8.1)

## 2017-07-04 LAB — PROTIME-INR
INR: 0.96
Prothrombin Time: 12.7 seconds (ref 11.4–15.2)

## 2017-07-04 LAB — GLUCOSE, CAPILLARY
GLUCOSE-CAPILLARY: 129 mg/dL — AB (ref 65–99)
GLUCOSE-CAPILLARY: 346 mg/dL — AB (ref 65–99)

## 2017-07-04 LAB — LIPASE, BLOOD: Lipase: 17 U/L (ref 11–51)

## 2017-07-04 MED ORDER — INSULIN ASPART 100 UNIT/ML ~~LOC~~ SOLN
0.0000 [IU] | Freq: Three times a day (TID) | SUBCUTANEOUS | Status: DC
  Administered 2017-07-05: 1 [IU] via SUBCUTANEOUS
  Administered 2017-07-05 – 2017-07-06 (×2): 15 [IU] via SUBCUTANEOUS
  Administered 2017-07-07: 5 [IU] via SUBCUTANEOUS

## 2017-07-04 MED ORDER — SODIUM CHLORIDE 0.9 % IV SOLN
INTRAVENOUS | Status: DC
  Administered 2017-07-04 – 2017-07-07 (×5): via INTRAVENOUS

## 2017-07-04 MED ORDER — THIAMINE HCL 100 MG/ML IJ SOLN: 100.0000 mg | Freq: Every day | INTRAMUSCULAR | Status: DC

## 2017-07-04 MED ORDER — QUETIAPINE FUMARATE 100 MG PO TABS
300.0000 mg | ORAL_TABLET | Freq: Every day | ORAL | Status: DC
  Administered 2017-07-04 – 2017-07-06 (×3): 300 mg via ORAL
  Filled 2017-07-04 (×3): qty 3

## 2017-07-04 MED ORDER — ONDANSETRON HCL 4 MG/2ML IJ SOLN: 4.0000 mg | Freq: Four times a day (QID) | INTRAMUSCULAR | Status: DC | PRN

## 2017-07-04 MED ORDER — LORAZEPAM 1 MG PO TABS
0.0000 mg | ORAL_TABLET | Freq: Four times a day (QID) | ORAL | Status: AC
  Administered 2017-07-04 – 2017-07-05 (×4): 1 mg via ORAL
  Filled 2017-07-04 (×6): qty 1

## 2017-07-04 MED ORDER — SODIUM CHLORIDE 0.9 % IV BOLUS (SEPSIS)
1000.0000 mL | Freq: Once | INTRAVENOUS | Status: AC
  Administered 2017-07-04: 1000 mL via INTRAVENOUS

## 2017-07-04 MED ORDER — CLONIDINE HCL 0.1 MG PO TABS
0.1000 mg | ORAL_TABLET | Freq: Three times a day (TID) | ORAL | Status: DC
  Administered 2017-07-04 – 2017-07-07 (×9): 0.1 mg via ORAL
  Filled 2017-07-04 (×9): qty 1

## 2017-07-04 MED ORDER — ESCITALOPRAM OXALATE 20 MG PO TABS
20.0000 mg | ORAL_TABLET | Freq: Every day | ORAL | Status: DC
  Administered 2017-07-05 – 2017-07-07 (×3): 20 mg via ORAL
  Filled 2017-07-04 (×3): qty 1

## 2017-07-04 MED ORDER — PANTOPRAZOLE SODIUM 40 MG IV SOLR
40.0000 mg | Freq: Once | INTRAVENOUS | Status: AC
  Administered 2017-07-04: 40 mg via INTRAVENOUS
  Filled 2017-07-04: qty 40

## 2017-07-04 MED ORDER — FOLIC ACID 1 MG PO TABS
1.0000 mg | ORAL_TABLET | Freq: Every day | ORAL | Status: DC
  Administered 2017-07-04 – 2017-07-07 (×4): 1 mg via ORAL
  Filled 2017-07-04 (×4): qty 1

## 2017-07-04 MED ORDER — HYDRALAZINE HCL 20 MG/ML IJ SOLN: 10.0000 mg | Freq: Four times a day (QID) | INTRAMUSCULAR | Status: DC | PRN

## 2017-07-04 MED ORDER — INSULIN GLARGINE 100 UNIT/ML ~~LOC~~ SOLN
10.0000 [IU] | Freq: Every day | SUBCUTANEOUS | Status: DC
  Administered 2017-07-04 – 2017-07-05 (×2): 10 [IU] via SUBCUTANEOUS
  Filled 2017-07-04 (×3): qty 0.1

## 2017-07-04 MED ORDER — IOPAMIDOL (ISOVUE-300) INJECTION 61%
100.0000 mL | Freq: Once | INTRAVENOUS | Status: AC | PRN
  Administered 2017-07-04: 100 mL via INTRAVENOUS

## 2017-07-04 MED ORDER — LORAZEPAM 1 MG PO TABS
1.0000 mg | ORAL_TABLET | Freq: Four times a day (QID) | ORAL | Status: DC | PRN
  Administered 2017-07-05 – 2017-07-07 (×7): 1 mg via ORAL
  Filled 2017-07-04 (×5): qty 1

## 2017-07-04 MED ORDER — LORAZEPAM 2 MG/ML IJ SOLN
1.0000 mg | Freq: Once | INTRAMUSCULAR | Status: AC
  Administered 2017-07-04: 1 mg via INTRAVENOUS
  Filled 2017-07-04: qty 1

## 2017-07-04 MED ORDER — LORAZEPAM 2 MG/ML IJ SOLN: 1.0000 mg | Freq: Four times a day (QID) | INTRAMUSCULAR | Status: DC | PRN

## 2017-07-04 MED ORDER — LISINOPRIL 20 MG PO TABS
20.0000 mg | ORAL_TABLET | Freq: Every day | ORAL | Status: DC
  Administered 2017-07-04 – 2017-07-07 (×4): 20 mg via ORAL
  Filled 2017-07-04 (×4): qty 1

## 2017-07-04 MED ORDER — ONDANSETRON HCL 4 MG PO TABS
4.0000 mg | ORAL_TABLET | Freq: Four times a day (QID) | ORAL | Status: DC | PRN
  Administered 2017-07-06: 4 mg via ORAL
  Filled 2017-07-04: qty 1

## 2017-07-04 MED ORDER — PANTOPRAZOLE SODIUM 40 MG IV SOLR
40.0000 mg | Freq: Two times a day (BID) | INTRAVENOUS | Status: DC
  Administered 2017-07-04 – 2017-07-05 (×2): 40 mg via INTRAVENOUS
  Filled 2017-07-04 (×2): qty 40

## 2017-07-04 MED ORDER — AMLODIPINE BESYLATE 10 MG PO TABS
10.0000 mg | ORAL_TABLET | Freq: Every day | ORAL | Status: DC
  Administered 2017-07-04 – 2017-07-07 (×4): 10 mg via ORAL
  Filled 2017-07-04 (×4): qty 1

## 2017-07-04 MED ORDER — MORPHINE SULFATE (PF) 4 MG/ML IV SOLN
2.0000 mg | INTRAVENOUS | Status: DC | PRN
  Administered 2017-07-04 – 2017-07-07 (×11): 2 mg via INTRAVENOUS
  Filled 2017-07-04 (×12): qty 1

## 2017-07-04 MED ORDER — OXYCODONE HCL 5 MG PO TABS
5.0000 mg | ORAL_TABLET | ORAL | Status: DC | PRN
  Administered 2017-07-04 – 2017-07-07 (×9): 5 mg via ORAL
  Filled 2017-07-04 (×9): qty 1

## 2017-07-04 MED ORDER — LORAZEPAM 1 MG PO TABS: 0.0000 mg | ORAL_TABLET | Freq: Two times a day (BID) | ORAL | Status: DC

## 2017-07-04 MED ORDER — VITAMIN B-1 100 MG PO TABS
100.0000 mg | ORAL_TABLET | Freq: Every day | ORAL | Status: DC
  Administered 2017-07-04 – 2017-07-07 (×4): 100 mg via ORAL
  Filled 2017-07-04 (×4): qty 1

## 2017-07-04 MED ORDER — MORPHINE SULFATE (PF) 4 MG/ML IV SOLN
4.0000 mg | Freq: Once | INTRAVENOUS | Status: AC
  Administered 2017-07-04: 4 mg via INTRAVENOUS
  Filled 2017-07-04: qty 1

## 2017-07-04 MED ORDER — ADULT MULTIVITAMIN W/MINERALS CH
1.0000 | ORAL_TABLET | Freq: Every day | ORAL | Status: DC
  Administered 2017-07-04 – 2017-07-07 (×4): 1 via ORAL
  Filled 2017-07-04 (×4): qty 1

## 2017-07-04 MED ORDER — ONDANSETRON HCL 4 MG/2ML IJ SOLN
4.0000 mg | Freq: Once | INTRAMUSCULAR | Status: AC
  Administered 2017-07-04: 4 mg via INTRAVENOUS
  Filled 2017-07-04: qty 2

## 2017-07-04 MED FILL — LANTUS SOLOSTAR 100 UNITS/M: 100 | 38 days supply | Qty: 15 | Fill #0

## 2017-07-04 NOTE — H&P (Signed)
[facility] History and Physical  Willie [name] JAS:[id number] DOB: Sep 29, 1975 DOA: 07/04/2017   PCP: Debbrah Alar, NP  Specialists: [facility]  Chief Complaint: Blood per rectum  HPI: Willie [name] is a 42 y.o. male with a past medical history of hypertension, diabetes mellitus on insulin, alcoholism, chronic pancreatitis, history of hemorrhoids who was in his usual state of health until yesterday evening around 4 PM when he started passing blood per rectum.  Patient is a poor historian.  He has had numerous episodes of bleeding since yesterday.  He has felt dizzy lightheaded but denies any syncopal episode.  He has developed abdominal pain in the mid part of the abdomen.  8 out of 10 in intensity.  TConstant pain without any radiation.  On Tuesday he had several episodes of nausea and vomiting for which he had to go to the emergency department.  Those symptoms have resolved.  He does have some nausea this morning but no vomiting.  He was supposed to be evaluated by his gastroenterologist for hemorrhoids with consideration of banding.  Because of his bleeding he went to see his primary care provider this morning who sent him to the emergency department.  Patient denies any fever or chills but has had sweating episodes.  In the emergency department he was found to have frank red blood per rectum.  CT scan was done which raised a concern for colitis in the transverse colon.  Case was discussed with gastroenterology.  He was admitted for further workup.  Home Medications: Prior to Admission medications   Medication Sig Start Date End Date Taking? Authorizing Provider  amLODipine (NORVASC) 10 MG tablet Take 1 tablet (10 mg total) by mouth daily. 02/27/17  Yes Debbrah Alar, NP  atorvastatin (LIPITOR) 20 MG tablet Take 1 tablet (20 mg total) by mouth daily. 02/27/17  Yes Debbrah Alar, NP  cloNIDine (CATAPRES) 0.1 MG tablet Take 1 tablet (0.1 mg total) by mouth 3 (three)  times daily. 02/27/17  Yes Debbrah Alar, NP  escitalopram (LEXAPRO) 20 MG tablet Take 1 tablet (20 mg total) by mouth daily. 02/27/17  Yes Debbrah Alar, NP  glucose blood (FREESTYLE LITE) test strip Use as instructed 02/27/17  Yes Debbrah Alar, NP  hydrocortisone (ANUSOL-HC) 25 MG suppository Place 1 suppository (25 mg total) rectally 2 (two) times daily. 06/10/17  Yes Doreatha Lew, MD  hydrOXYzine (ATARAX/VISTARIL) 50 MG tablet Take 1 tablet (50 mg total) by mouth 3 (three) times daily as needed. Patient taking differently: Take 50 mg by mouth 3 (three) times daily as needed for anxiety.  02/27/17  Yes Debbrah Alar, NP  insulin lispro (HUMALOG KWIKPEN) 100 UNIT/ML KiwkPen 3 times a day (just before each meal) 09-10-08 units, Patient taking differently: Inject 5-15 Units into the skin See admin instructions. 15 units into the skin with breakfast then 5 units with lunch then 15 units with dinner (evening meal) 02/27/17  Yes Debbrah Alar, NP  Insulin Pen Needle (ULTICARE SHORT PEN NEEDLES) 31G X 8 MM MISC Use once daily to inject insulin. 02/27/17  Yes Debbrah Alar, NP  Lancets (FREESTYLE) lancets 2 (two) times daily 02/27/17  Yes Debbrah Alar, NP  LANTUS SOLOSTAR 100 UNIT/ML Solostar Pen INJECT 40 UNITS INTO THE SKIN DAILY AT 10 PM. 07/04/17  Yes Debbrah Alar, NP  lisinopril (PRINIVIL,ZESTRIL) 20 MG tablet TAKE 1 TABLET (20 MG TOTAL) BY MOUTH DAILY. Patient taking differently: Take 20 mg by mouth daily.  02/27/17  Yes Debbrah Alar, NP  Multiple  Vitamins-Minerals (MULTIVITAMIN) tablet Take 1 tablet by mouth daily. 02/12/16  Yes Palumbo, April, MD  ondansetron (ZOFRAN ODT) 4 MG disintegrating tablet Take 1 tablet (4 mg total) by mouth every 6 (six) hours as needed for nausea or vomiting. 07/02/17  Yes Ward, Kristen N, DO  pantoprazole (PROTONIX) 40 MG tablet Take 1 tablet (40 mg total) by mouth 2 (two) times daily. 03/27/17  Yes Barton Dubois, MD    potassium chloride (K-DUR,KLOR-CON) 10 MEQ tablet Take 2 tablets (20 mEq total) by mouth daily. 02/27/17  Yes Debbrah Alar, NP  QUEtiapine (SEROQUEL) 300 MG tablet Take 1 tablet (300 mg total) by mouth at bedtime. 02/27/17  Yes Debbrah Alar, NP  thiamine 100 MG tablet Take 1 tablet (100 mg total) by mouth daily. 02/27/17  Yes Debbrah Alar, NP  Blood Glucose Monitoring Suppl (FREESTYLE FREEDOM LITE) w/Device KIT 2 (two) times daily 02/27/17   Debbrah Alar, NP    Allergies:  Allergies  Allergen Reactions  . Invokamet [Canagliflozin-Metformin Hcl] Other (See Comments)    Lactic acidosis  . Metformin And Related Other (See Comments)    DRASTIC drop in blood sugar    Past Medical History: Past Medical History:  Diagnosis Date  . Alcohol abuse   . Alcoholic pancreatitis    recurrent  . Depression   . Diabetes mellitus, type II ([place])    New Onset 03/2010  . GERD (gastroesophageal reflux disease)   . High cholesterol   . 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 Hypertension   . Sleep apnea    does not wear a CPAP    Past Surgical History:  Procedure Laterality Date  . COLONOSCOPY W/ BIOPSIES  09/2010   Dr. Ardis Hughs.  for intermittent rectal bleeding.  Mild sigmoid to descending diverticulosis.  Mild left colon erythema, benign biopsy, probably "prep effect"  . LUMBAR MICRODISCECTOMY  ~ 2004   Dr Ellene Route    Social History: Patient lives in [place].  He denies smoking however he drinks quite heavily.  Drinks 6 beers on a daily basis.  Denies any wine intake or liquid use.  Denies any recreational drug use.  Family History:  Family History  Problem Relation Age of Onset  . Diabetes Mother   . Lung cancer Brother        twin brother  . Pancreatic cancer Paternal Aunt   . Colon cancer Neg Hx   . Stomach cancer Neg Hx      Review of Systems - History obtained from the patient General ROS: positive for   - fatigue Psychological ROS: negative Ophthalmic ROS: negative ENT ROS: negative Allergy and Immunology ROS: negative Hematological and Lymphatic ROS: negative Endocrine ROS: negative Respiratory ROS: no cough, shortness of breath, or wheezing Cardiovascular ROS: no chest pain or dyspnea on exertion Gastrointestinal ROS: as in hpi Genito-Urinary ROS: no dysuria, trouble voiding, or hematuria Musculoskeletal ROS: negative Neurological ROS: no TIA or stroke symptoms Dermatological ROS: negative  Physical Examination  Vitals:   07/04/17 1500 07/04/17 1515 07/04/17 1544 07/04/17 1702  BP: (!) 158/104 (!) 156/102 (!) 156/102 (!) 168/107  Pulse:  85 89 76  Resp:  12  18  Temp:    99 F (37.2 C)  TempSrc:    Oral  SpO2:  98%  97%  Weight:    94.5 kg (208 lb 5.4 oz)  Height:    6' (1.829 m)    BP (!) 168/107 (  BP Location: Left Arm) Comment: nurse notified  Pulse 76   Temp 99 F (37.2 C) (Oral)   Resp 18   Ht 6' (1.829 m)   Wt 94.5 kg (208 lb 5.4 oz)   SpO2 97%   BMI 28.26 kg/m   General appearance: alert, cooperative, appears stated age and no distress Head: Normocephalic, without obvious abnormality, atraumatic Eyes: conjunctivae/corneas clear. PERRL, EOM's intact. Fundi benign. Throat: lips, mucosa, and tongue normal; teeth and gums normal Neck: no adenopathy, no carotid bruit, no JVD, supple, symmetrical, trachea midline and thyroid not enlarged, symmetric, no tenderness/mass/nodules Resp: clear to auscultation bilaterally Cardio: regular rate and rhythm, S1, S2 normal, no murmur, click, rub or gallop GI: Abdomen is soft.  Diffusely tender without any rebound rigidity or guarding.  No masses organomegaly.  Bowel sounds are present.  Rectal exam done by previous provider showed bright red blood. Extremities: extremities normal, atraumatic, no cyanosis or edema Pulses: 2+ and symmetric Skin: Skin color, texture, turgor normal. No rashes or lesions Lymph nodes: Cervical,  supraclavicular, and axillary nodes normal. Neurologic: Alert and oriented x3.  Cranial nerves II to XII intact.  Motor strength equal bilateral upper and lower extremities.   Labs on Admission: I have personally reviewed following labs and imaging studies  CBC: Recent Labs  Lab 07/01/17 2125 07/04/17 1124 07/04/17 1226  WBC 11.0*  --  8.0  HGB 14.1 12.3* 12.4*  HCT 38.2*  --  35.1*  MCV 82.7  --  86.5  PLT 381  --  630   Basic Metabolic Panel: Recent Labs  Lab 07/01/17 2125 07/04/17 1226  NA 132* 135  K 4.3 3.9  CL 90* 100*  CO2 23 27  GLUCOSE 307* 115*  BUN 7 7  CREATININE 1.11 0.84  CALCIUM 8.6* 8.6*   GFR: Estimated Creatinine Clearance: 139.6 mL/min (by C-G formula based on SCr of 0.84 mg/dL). Liver Function Tests: Recent Labs  Lab 07/01/17 2125 07/04/17 1226  AST 261* 220*  ALT 167* 178*  ALKPHOS 146* 124  BILITOT 1.7* 0.6  PROT 7.6 6.9  ALBUMIN 3.9 3.7   Recent Labs  Lab 07/01/17 2125 07/04/17 1226  LIPASE 19 17   Recent Labs  Lab 07/01/17 2233  AMMONIA 41*   Coagulation Profile: Recent Labs  Lab 07/04/17 1226  INR 0.96   CBG: Recent Labs  Lab 07/01/17 2117 07/02/17 0318  GLUCAP 287* 207*    Radiological Exams on Admission: Ct Abdomen Pelvis W Contrast  Result Date: 07/04/2017 CLINICAL DATA:  Bright red rectal bleeding since this morning, history diabetes mellitus, hypertension, folic pancreatitis EXAM: CT ABDOMEN AND PELVIS WITH CONTRAST TECHNIQUE: Multidetector CT imaging of the abdomen and pelvis was performed using the standard protocol following bolus administration of intravenous contrast. Sagittal and coronal MPR images reconstructed from axial data set. CONTRAST:  152m ISOVUE-300 IOPAMIDOL (ISOVUE-300) INJECTION 61% IV. No oral contrast. COMPARISON:  03/26/2017 FINDINGS: Lower chest: Lung bases clear Hepatobiliary: Fatty infiltration of liver. Gallbladder and liver otherwise normal appearance Pancreas: Atrophic with mild  dilatation of the pancreatic duct up to 6 mm diameter. No definite pancreatic mass. Few calcifications at pancreatic head/uncinate. Spleen: Normal appearance.  Small splenule. Adrenals/Urinary Tract: Adrenal glands normal appearance. Kidneys, ureters, and bladder normal appearance. Stomach/Bowel: Normal appendix. Wall thickening of a segment of the mid transverse colon question colitis versus mass. Minimal distal colonic diverticulosis without evidence of diverticulitis. Stomach and bowel loops otherwise unremarkable for technique. Vascular/Lymphatic: Vascular structures unremarkable. No adenopathy. Reproductive: Unremarkable Other:  No free air or free fluid. LEFT small LEFT inguinal hernia containing fat. Small umbilical hernia containing fat. Musculoskeletal: Normal appearance IMPRESSION: Segmental wall thickening of the mid transverse colon which could represent segmental colitis or mass; correlation with colonoscopy recommended. Minimal distal colonic diverticulosis without evidence of diverticulitis. Fatty infiltration of liver. 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 LEFT inguinal and umbilical hernias containing fat. Electronically Signed   By: Lavonia Dana M.D.   On: 07/04/2017 13:17    My interpretation of Electrocardiogram: Sinus rhythm 94 bpm.  Normal axis.  Intervals are normal.  No Q waves.  No concerning ST or T wave changes.   Problem List  Principal Problem:   Lower GI bleed Active Problems:   Diabetes type 2, uncontrolled (HCC)   Essential hypertension   Chronic pancreatitis ([facility])   Alcohol abuse   Anemia, blood loss   Transaminitis   Assessment: This is a 42 year old Caucasian male with a past medical history as stated earlier who presents with hematochezia.  CT scan is concerning for colitis.  Differential diagnosis is broad including ischemic colitis, infectious colitis seems less likely as there is been no mention of loose stools.   Hemorrhoidal bleeding is another possibility.  Plan: #1 hematochezia: Patient will be hospitalized.  Gastroenterology has been consulted.  Discussed with Dr. Hilarie Fredrickson.  He will be kept on clear liquid diet for now.  CBC will be monitored closely.  Patient may need colonoscopy for further evaluation.  #2  Findings of colitis on CT scan: Involves an area which raises concern for this being ischemic colitis.  There is also concern for a mass in the transverse colon.  His WBC is normal.  He is afebrile.  Hold off on antibiotics for now.  Follow clinically.  Lactic acid level is normal.  Abdominal pain could be due to the colitis.  He will be given analgesic agents.  #3  Acute blood loss anemia: Mild drop in hemoglobin is noted.  Hemoglobin was 14 a few days ago and is 12 today.  Monitor CBC every 6 8 hours for now.  Transfuse if it drops significantly.  INR was normal.  #4 alcohol abuse: Patient drinks large quantities of beer.  Last consumption was yesterday.  He will be placed on CIWA protocol.  Thiamine and folic acid.  Alcohol cessation counseling.  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  Check hepatitis panel.  Fatty infiltration of liver noted on CT scan.  Gallbladder was normal in appearance.  Hold his statin.  Repeat labs tomorrow.  #6 diabetes mellitus type 2, uncontrolled: Last HbA1c 8.4 in February.  He will be placed on sliding scale coverage.  Continue with Lantus at a lower dose.  Monitor CBGs.  #7  Essential hypertension: Blood pressure noted to be elevated.  He has not taken his antihypertensives today.  These will be reinitiated.  Hydralazine as needed.  He is also noted to be on clonidine which will be continued.  #8  Chronic pancreatitis: Noted on CT scan.  Most likely due to alcohol use.  Lipase is normal.  #9 incidental finding of umbilical and inguinal hernias: Does not appear to be the reason for his current symptoms.  #10 history of depression:  Continue with home medications.  DVT Prophylaxis: SCDs Code Status: Full code Family Communication: Discussed with the patient Consults called: Gastroenterology  Severity of Illness: The appropriate patient status for this patient is INPATIENT. Inpatient status is judged  to be reasonable and necessary in order to provide the required intensity of service to ensure the patient's safety. The patient's presenting symptoms, physical exam findings, and initial radiographic and laboratory data in the context of their chronic comorbidities is felt to place them at high risk for further clinical deterioration. Furthermore, it is not anticipated that the patient will be medically stable for discharge from the hospital within 2 midnights of admission. The following factors support the patient status of inpatient.   " The patient's presenting symptoms include blood per rectum. " The worrisome physical exam findings include abdominal tenderness. " The initial radiographic and laboratory data are worrisome because of colitis. " The chronic co-morbidities include diabetes mellitus type 2.   * I certify that at the point of admission it is my clinical judgment that the patient will require inpatient hospital care spanning beyond 2 midnights from the point of admission due to high intensity of service, high risk for further deterioration and high frequency of surveillance required.*  Further management decisions will depend on results of further testing and patient's response to treatment.   Bonnielee Haff  [facility] Pager [phone]  If 7PM-7AM, please contact night-coverage [email or web address] Password [facility]  07/04/2017, 5:55 PM

## 2017-07-04 NOTE — ED Notes (Signed)
Pt in CT.

## 2017-07-04 NOTE — Patient Instructions (Signed)
Please go directly to the ED on the first floor.

## 2017-07-04 NOTE — Consult Note (Signed)
[facility] Consult: 9:02 AM 07/05/2017  LOS: 1 day    Referring Provider: Dr Curly Rim  Primary Care Physician:  Debbrah Alar, NP Primary Gastroenterologist:  Dr. Havery Moros.      Reason for Consultation:  Rectal bleeding  HPI: Willie [name] is a 42 y.o. male.  Hx Insulin-requiring type 2 diabetes mellitus.  Recurrent alcoholic pancreatitis dating back to at least 2010.  Chronic calcific pancreatitis.  Hemorrhoidal bleeding.  Periodic diarrhea and n/v Dr Ardis Hughs attributed to binge ETOH   09/2010 colonoscopy at [facility] [place] for minor, remittent rectal bleeding.  Dr. Ardis Hughs.  Revealed mild sigmoid to descending diverticulosis.  Mild left colon erythema, chronic inflammation versus "prep effect", biopsy of the area proved benign. Normal terminal ileum. Serum H. pylori antigen negative in 02/2015.  Dr Carlean Purl consulted pt 06/08/17 for rectal bleeding and Hgb 12.8.  Anoscopy revealed hemorrhoids and bleeding improved with Anusol Hc suppositories.  He has pending appts with Dr Havery Moros for hemorrhoidal banding on 4/23 and 5/7.    Seen in ED 3/5 after multiple episodes, >10, non-bloody emesis, abdominal cramping pain different than pancreatitis.  Had "3 beers" the previous evening.  Pulse in 120s, BP 146/100  Glucose 307.  Lipase 19. T bili 1.7.  Alk phos 146 .  AST/ALT 261/167.  Na 132. Renal fx normal.  Hgb 14.1.  Ethanol <10.   Improvement with IVF, Zofran, Ativan, Protonix and discharged home.   He was doing fine on Wednesday, the next day. On Thursday afternoon he started having loose stools some of these were bloody.  Sometimes he was passing only blood.  He felt pressure, discomfort in his lower abdomen but felt like a strong urge to defecate.   No recurrent N/V.  Pain not nearly s severe as when he has had  pancreatitis.   Seen by Debbrah Alar 3/8.  By that mid morning morning hehad passed 10 episodes of blood or bloody stools.   BPR on DRE.  Tachy to 120s and POC Hgb 12.3, from 14.1 on 3/5 so NP sent him to [facility] ED.      Contrasted CT ab/pelvis: segmental colitis (vs mass) at mid-transverse.  Mnor diverticulosis.  Fatty liver.  Chronic calcific pancreatitis with atrophy and PD dilatation.  Fat-containing left and umbilical hernias. The transverse colon was unremarkable on CT of 02/2017 WBCs, Coags, platelets, BUN ok.  Serum Hgb 12.4 >> 11.2 overnight.  FOBT+.           Still drinking: beers on his days off.  Last alcohol was 2 days ago.  No one at home or in his severe has had GI illness. No family history of colon cancer.  He had a twin who died of cancer that started in the lymph nodes and metastasized.    Past Medical History:  Diagnosis Date  . Alcohol abuse   . Alcoholic pancreatitis    recurrent  . Depression   . Diabetes mellitus, type II ([place])    New Onset 03/2010  . GERD (gastroesophageal reflux disease)   . High cholesterol   .  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 Hypertension   . Sleep apnea    does not wear a CPAP    Past Surgical History:  Procedure Laterality Date  . COLONOSCOPY W/ BIOPSIES  09/2010   Dr. Ardis Hughs.  for intermittent rectal bleeding.  Mild sigmoid to descending diverticulosis.  Mild left colon erythema, benign biopsy, probably "prep effect"  . LUMBAR MICRODISCECTOMY  ~ 2004   Dr Ellene Route    Prior to Admission medications   Medication Sig Start Date End Date Taking? Authorizing Provider  amLODipine (NORVASC) 10 MG tablet Take 1 tablet (10 mg total) by mouth daily. 02/27/17  Yes Debbrah Alar, NP  atorvastatin (LIPITOR) 20 MG tablet Take 1 tablet (20 mg total) by mouth daily. 02/27/17  Yes Debbrah Alar, NP  cloNIDine (CATAPRES) 0.1 MG tablet Take 1 tablet (0.1 mg total) by mouth 3  (three) times daily. 02/27/17  Yes Debbrah Alar, NP  escitalopram (LEXAPRO) 20 MG tablet Take 1 tablet (20 mg total) by mouth daily. 02/27/17  Yes Debbrah Alar, NP  glucose blood (FREESTYLE LITE) test strip Use as instructed 02/27/17  Yes Debbrah Alar, NP  hydrocortisone (ANUSOL-HC) 25 MG suppository Place 1 suppository (25 mg total) rectally 2 (two) times daily. 06/10/17  Yes Doreatha Lew, MD  hydrOXYzine (ATARAX/VISTARIL) 50 MG tablet Take 1 tablet (50 mg total) by mouth 3 (three) times daily as needed. Patient taking differently: Take 50 mg by mouth 3 (three) times daily as needed for anxiety.  02/27/17  Yes Debbrah Alar, NP  Insulin Glargine (LANTUS SOLOSTAR) 100 UNIT/ML Solostar Pen Inject 40 Units into the skin daily at 10 pm. Patient taking differently: Inject 35 Units into the skin at bedtime.  02/27/17  Yes Debbrah Alar, NP  insulin lispro (HUMALOG KWIKPEN) 100 UNIT/ML KiwkPen 3 times a day (just before each meal) 09-10-08 units, Patient taking differently: Inject 5-15 Units into the skin See admin instructions. 15 units into the skin with breakfast then 5 units with lunch then 15 units with dinner (evening meal) 02/27/17  Yes Debbrah Alar, NP  Insulin Pen Needle (ULTICARE SHORT PEN NEEDLES) 31G X 8 MM MISC Use once daily to inject insulin. 02/27/17  Yes Debbrah Alar, NP  Lancets (FREESTYLE) lancets 2 (two) times daily 02/27/17  Yes Debbrah Alar, NP  lisinopril (PRINIVIL,ZESTRIL) 20 MG tablet TAKE 1 TABLET (20 MG TOTAL) BY MOUTH DAILY. Patient taking differently: Take 20 mg by mouth daily.  02/27/17  Yes Debbrah Alar, NP  Multiple Vitamins-Minerals (MULTIVITAMIN) tablet Take 1 tablet by mouth daily. 02/12/16  Yes Palumbo, April, MD  ondansetron (ZOFRAN ODT) 4 MG disintegrating tablet Take 1 tablet (4 mg total) by mouth every 6 (six) hours as needed for nausea or vomiting. 07/02/17  Yes Ward, Kristen N, DO  pantoprazole (PROTONIX) 40 MG  tablet Take 1 tablet (40 mg total) by mouth 2 (two) times daily. 03/27/17  Yes Barton Dubois, MD  potassium chloride (K-DUR,KLOR-CON) 10 MEQ tablet Take 2 tablets (20 mEq total) by mouth daily. 02/27/17  Yes Debbrah Alar, NP  QUEtiapine (SEROQUEL) 300 MG tablet Take 1 tablet (300 mg total) by mouth at bedtime. 02/27/17  Yes Debbrah Alar, NP  thiamine 100 MG tablet Take 1 tablet (100 mg total) by mouth daily. 02/27/17  Yes Debbrah Alar, NP  Blood Glucose Monitoring Suppl (FREESTYLE FREEDOM LITE) w/Device KIT 2 (two) times daily 02/27/17   Debbrah Alar, NP    Scheduled  Meds:  Infusions:  PRN Meds:    Allergies as of 07/04/2017 - Review Complete 07/04/2017  Allergen Reaction Noted  . Invokamet [canagliflozin-metformin hcl] Other (See Comments) 11/18/2014  . Metformin and related Other (See Comments) 12/28/2014    Family History  Problem Relation Age of Onset  . Diabetes Mother   . Lung cancer Brother        twin brother  . Pancreatic cancer Paternal Aunt   . Colon cancer Neg Hx   . Stomach cancer Neg Hx     Social History   Socioeconomic History  . Marital status: Married    Spouse name: Not on file  . Number of children: 2  . Years of education: Not on file  . Highest education level: Not on file  Social Needs  . Financial resource strain: Not on file  . Food insecurity - worry: Not on file  . Food insecurity - inability: Not on file  . Transportation needs - medical: Not on file  . Transportation needs - non-medical: Not on file  Occupational History  . Occupation: Landscape architect: [employer]  Tobacco Use  . Smoking status: Former Smoker    Packs/day: 0.25    Years: 1.00    Pack years: 0.25    Types: Cigarettes    Start date: 04/29/2016  . Smokeless tobacco: Never Used  . Tobacco comment: 4 Cigarettes a day   Substance and Sexual Activity  . Alcohol use: Yes    Alcohol/week: 25.2 oz    Types: 42 Cans of  beer per week  . Drug use: No    Comment: none in years  . Sexual activity: Not on file  Other Topics Concern  . 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  GU:  No hematuria, no frequency GI:  Per HPI Heme: Other, he has not had any unusual bleeding or excessive bruising. Transfusions: No history of blood transfusions. Neuro: Some dizziness but no syncope.  No history of seizures.  No headaches, no peripheral tingling or numbness Derm:  No itching, no rash or sores.  Endocrine:  No sweats or chills.  No polyuria or dysuria Immunization: Reviewed vaccination history.  His flu shot is up-to-date. Travel:  None beyond local counties in last few months.    PHYSICAL EXAM: Vital signs in last 24 hours: Vitals:   07/05/17 0417 07/05/17 0732  BP: (!) 149/86 (!) 161/104  Pulse: 73 65  Resp: 18 18  Temp: 98.1 F (36.7 C) 97.7 F (36.5 C)  SpO2: 100% 100%   Wt Readings from Last 3 Encounters:  07/04/17 208 lb 5.4 oz (94.5 kg)  07/04/17 210 lb 3.2 oz (95.3 kg)  07/01/17 200 lb (90.7 kg)    General: Pleasant, comfortable.  Does not look ill.  Awake and alert. Head: No facial asymmetry or swelling. Eyes: No scleral icterus.  No conjunctival pallor.  EOMI. Ears: Not hard of hearing. Nose: No discharge or congestion. Mouth: Tongue midline.  Oral mucosa moist and clear. Neck: No JVD,  no masses, no thyromegaly. Lungs: Clear bilaterally.  No dyspnea, no cough. Heart: RRR.  No MRG.  S1, S2 present. Abdomen: Soft.  Mild to moderate tenderness throughout.  No guarding or rebound.  Normal, active bowel sounds.  No bruits..  Did not appreciate hernias. Rectal: A stool was observed in  the "hat" in the commode.  The stool itself was soft, formed, large, gray and there was a small, perhaps half a teaspoon amount of red blood observed.  On rectal exam  did not palpate hemorrhoids or masses.  Exam finger displayed red blood. Musc/Skeltl: No joint redness, swelling or significant deformities. Extremities: No CCE. Neurologic: Fully alert, oriented.  No tremors.  Full limb strength.  No gross neurologic deficits. Skin: No rash, no sores, no telangiectasia. Tattoos: On upper right arm and left hand. Nodes: No cervical or inguinal adenopathy. Psych: Pleasant, slightly anxious.  Fluid speech.  Good historian.  Intake/Output from previous day: 03/08 0701 - 03/09 0700 In: 1246.3 [P.O.:240; I.V.:6.3; IV Piggyback:1000] Out: -  Intake/Output this shift: No intake/output data recorded.  LAB RESULTS: Recent Labs    07/04/17 1226 07/04/17 1825 07/05/17 0128  WBC 8.0 7.2 5.7  HGB 12.4* 11.7* 11.2*  HCT 35.1* 33.9* 32.3*  PLT 215 192 191   BMET Lab Results  Component Value Date   NA 138 07/05/2017   NA 135 07/04/2017   NA 132 (L) 07/01/2017   K 3.4 (L) 07/05/2017   K 3.9 07/04/2017   K 4.3 07/01/2017   CL 104 07/05/2017   CL 100 (L) 07/04/2017   CL 90 (L) 07/01/2017   CO2 28 07/05/2017   CO2 27 07/04/2017   CO2 23 07/01/2017   GLUCOSE 200 (H) 07/05/2017   GLUCOSE 115 (H) 07/04/2017   GLUCOSE 307 (H) 07/01/2017   BUN 5 (L) 07/05/2017   BUN 7 07/04/2017   BUN 7 07/01/2017   CREATININE 0.77 07/05/2017   CREATININE 0.84 07/04/2017   CREATININE 1.11 07/01/2017   CALCIUM 8.5 (L) 07/05/2017   CALCIUM 8.6 (L) 07/04/2017   CALCIUM 8.6 (L) 07/01/2017   LFT Recent Labs    07/04/17 1226 07/05/17 0128  PROT 6.9 5.9*  ALBUMIN 3.7 3.0*  AST 220* 114*  ALT 178* 129*  ALKPHOS 124 96  BILITOT 0.6 0.5   PT/INR Lab Results  Component Value Date   INR 0.96 07/04/2017   INR 1.01 06/07/2017   INR 1.0 02/26/2016   Hepatitis Panel No results for input(s): HEPBSAG,  HCVAB, HEPAIGM, HEPBIGM in the last 72 hours. C-Diff No components found for: CDIFF Lipase     Component Value Date/Time   LIPASE 17 07/04/2017 1226    Drugs of Abuse     Component Value Date/Time   LABOPIA NONE DETECTED 07/01/2017 2311   COCAINSCRNUR NONE DETECTED 07/01/2017 2311   LABBENZ NONE DETECTED 07/01/2017 2311   AMPHETMU NONE DETECTED 07/01/2017 2311   THCU NONE DETECTED 07/01/2017 2311   LABBARB NONE DETECTED 07/01/2017 2311     RADIOLOGY STUDIES: Ct Abdomen Pelvis W Contrast  Result Date: 07/04/2017 CLINICAL DATA:  Bright red rectal bleeding since this morning, history diabetes mellitus, hypertension, folic pancreatitis EXAM: CT ABDOMEN AND PELVIS WITH CONTRAST TECHNIQUE: Multidetector CT imaging of the abdomen and pelvis was performed using the standard protocol following bolus administration of intravenous contrast. Sagittal and coronal MPR images reconstructed from axial data set. CONTRAST:  110m ISOVUE-300 IOPAMIDOL (ISOVUE-300) INJECTION 61% IV. No oral contrast. COMPARISON:  03/26/2017 FINDINGS: Lower chest: Lung bases clear  Hepatobiliary: Fatty infiltration of liver. Gallbladder and liver otherwise normal appearance Pancreas: Atrophic with mild dilatation of the pancreatic duct up to 6 mm diameter. No definite pancreatic mass. Few calcifications at pancreatic head/uncinate. Spleen: Normal appearance.  Small splenule. Adrenals/Urinary Tract: Adrenal glands normal appearance. Kidneys, ureters, and bladder normal appearance. Stomach/Bowel: Normal appendix. Wall thickening of a segment of the mid transverse colon question colitis versus mass. Minimal distal colonic diverticulosis without evidence of diverticulitis. Stomach and bowel loops otherwise unremarkable for technique. Vascular/Lymphatic: Vascular structures unremarkable. No adenopathy. Reproductive: Unremarkable Other: No free air or free fluid. LEFT small LEFT inguinal hernia containing fat. Small umbilical hernia  containing fat. Musculoskeletal: Normal appearance IMPRESSION: Segmental wall thickening of the mid transverse colon which could represent segmental colitis or mass; correlation with colonoscopy recommended. Minimal distal colonic diverticulosis without evidence of diverticulitis. Fatty infiltration of liver. 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 LEFT inguinal and umbilical hernias containing fat. Electronically Signed   By: Lavonia Dana M.D.   On: 07/04/2017 13:17     IMPRESSION:   *  Hemorrhoidal bleeding.   Some of the bleeding could be from the colitis but I suspect it is a combination of hemorrhoidal and intestinal bleeding Set up for office based hemorrhoidal banding in April and May.    *  Transverse, segmental colitis.  Suspect ischemic colitis. 09/2010 Colonoscopy: diverticulosis.  Mild, non-specific erythema in left colon, benign colonic mucosa on biopsy.     *  Anemia.  Hgb drop 3 gm over 5 days.  Does not need transfusion yet.  *   Chronic calcific pancreatitis.  Attributed to previous bouts of ETOH pancreatitis.   Still drinking beer.  Lipase repeatedly normal over last several days.    *  Alcoholism, not in remission.   Elevated LFTs, generally in and alcoholic pattern, though today ALT >> AST.  Fatty liver on CT.    *   Mild hypokalemia.     *   IDDM.  Hgb A1c 8.4 on 06/08/17.  Correlates with avg serum glucose ~ 194.    *   Umbilical and inguinal hernias, no complications.    PLAN:     *   Added Anusol suppositories.  Change to 1 x daily Protonix.  CIWA protocol in place.  Keep on clear diet for now.    *  ? Need for colonoscopy?  Dr Hilarie Fredrickson will decide.    *  Has OV for hemorrhoidal banding with Dr Havery Moros set for 4/23 and 5/7   Azucena Freed  07/05/2017, 9:02 AM Pager: [phone]

## 2017-07-04 NOTE — ED Notes (Signed)
Belongings placed in bag for transport

## 2017-07-04 NOTE — ED Provider Notes (Signed)
[facility] EMERGENCY DEPARTMENT Provider Note   CSN: [id number] Arrival date & time: 07/04/17  1151     History   Chief Complaint Chief Complaint  Patient presents with  . Rectal Bleeding    HPI Willie [name] is a 42 y.o. male.  He presents to the ED after being seen in the clinic this morning with complaints of rectal bleeding since 2 AM this morning.  He states he acutely woke up with this feeling to move his bowels with some lower abdominal crampy pain and passed some diarrhea with some blood.  He has been leaking blood from his anus since then.  He said it even passes through when he is urinating although he is not urinating any blood.  He has had this before, states he has hemorrhoids and is needed a banding and was supposed to get that soon.  But has never bled this badly.  He is complaining of a little bit of rectal pressure but mostly low abdominal pain.  He is feeling nauseous.  He is also feeling lightheaded.  I discussed with the provider from the clinic he was seen at today and they said he had a normal rectal exam other than red blood on the glove but no masses, and that his hemoglobin had dropped about a point half from prior blood work. Patient was recently in the emergency department a few days ago for vomiting and was ultimately sent home after fluids.  He is a poorly controlled diabetic and he states he drinks every couple of days but does not have any withdrawal symptoms.  The history is provided by the patient.  Rectal Bleeding  Quality:  Bright red Amount:  Moderate Timing:  Intermittent Chronicity:  Recurrent Context: hemorrhoids   Similar prior episodes: yes   Relieved by:  Nothing Worsened by:  Nothing Ineffective treatments:  None tried Associated symptoms: abdominal pain, dizziness, light-headedness, recent illness and vomiting   Associated symptoms: no epistaxis, no fever, no hematemesis and no loss of consciousness   Abdominal pain:   Location:  LLQ and RLQ   Quality: cramping     Severity:  Moderate   Onset quality:  Gradual   Timing:  Constant   Chronicity:  New   Past Medical History:  Diagnosis Date  . Alcohol abuse   . Alcoholic pancreatitis    recurrent  . Depression   . Diabetes mellitus, type II ([place])    New Onset 03/2010  . GERD (gastroesophageal reflux disease)   . High cholesterol   . 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 Hypertension   . Sleep apnea    does not wear a CPAP    Patient Active Problem List   Diagnosis Date Noted  . Pain   . Bleeding internal hemorrhoids   . Anemia of chronic disease   . Left arm weakness   . Anemia, blood loss   . Vomiting alone 03/27/2017  . Chest pain 03/27/2017  . Nausea and vomiting   . Lactic acidosis 03/26/2017  . History of alcohol abuse 06/25/2016  . Tobacco abuse 06/25/2016  . Anxiety state 11/20/2015  . Dental infection 07/10/2015  . Mood disorder ([place]) 04/18/2015  . Eczema 06/27/2014  . GERD (gastroesophageal reflux disease) 05/24/2014  . Snoring 04/07/2013  . Suicide attempt by substance overdose ([place]) 02/10/2013  . Alcohol abuse 12/31/2012  . Erectile dysfunction 12/09/2012  . Hematuria 06/11/2012  .  Vision problem 02/02/2012  . Insomnia 12/20/2011  . Chronic pancreatitis ([place]) 02/05/2011  . Depression with anxiety 11/13/2010  . Hyperlipemia 06/27/2010  . Diabetes type 2, uncontrolled ([place]) 05/22/2010  . Essential hypertension 05/22/2010    Past Surgical History:  Procedure Laterality Date  . COLONOSCOPY W/ BIOPSIES  09/2010   Dr. Ardis Hughs.  for intermittent rectal bleeding.  Mild sigmoid to descending diverticulosis.  Mild left colon erythema, benign biopsy, probably "prep effect"  . LUMBAR MICRODISCECTOMY  ~ 2004   Dr Ellene Route       Home Medications    Prior to Admission medications   Medication Sig Start Date End Date Taking? Authorizing Provider  amLODipine (NORVASC) 10  MG tablet Take 1 tablet (10 mg total) by mouth daily. 02/27/17  Yes Debbrah Alar, NP  atorvastatin (LIPITOR) 20 MG tablet Take 1 tablet (20 mg total) by mouth daily. 02/27/17  Yes Debbrah Alar, NP  cloNIDine (CATAPRES) 0.1 MG tablet Take 1 tablet (0.1 mg total) by mouth 3 (three) times daily. 02/27/17  Yes Debbrah Alar, NP  escitalopram (LEXAPRO) 20 MG tablet Take 1 tablet (20 mg total) by mouth daily. 02/27/17  Yes Debbrah Alar, NP  glucose blood (FREESTYLE LITE) test strip Use as instructed 02/27/17  Yes Debbrah Alar, NP  hydrocortisone (ANUSOL-HC) 25 MG suppository Place 1 suppository (25 mg total) rectally 2 (two) times daily. 06/10/17  Yes Doreatha Lew, MD  hydrOXYzine (ATARAX/VISTARIL) 50 MG tablet Take 1 tablet (50 mg total) by mouth 3 (three) times daily as needed. Patient taking differently: Take 50 mg by mouth 3 (three) times daily as needed for anxiety.  02/27/17  Yes Debbrah Alar, NP  Insulin Glargine (LANTUS SOLOSTAR) 100 UNIT/ML Solostar Pen Inject 40 Units into the skin daily at 10 pm. Patient taking differently: Inject 35 Units into the skin at bedtime.  02/27/17  Yes Debbrah Alar, NP  insulin lispro (HUMALOG KWIKPEN) 100 UNIT/ML KiwkPen 3 times a day (just before each meal) 09-10-08 units, Patient taking differently: Inject 5-15 Units into the skin See admin instructions. 15 units into the skin with breakfast then 5 units with lunch then 15 units with dinner (evening meal) 02/27/17  Yes Debbrah Alar, NP  Insulin Pen Needle (ULTICARE SHORT PEN NEEDLES) 31G X 8 MM MISC Use once daily to inject insulin. 02/27/17  Yes Debbrah Alar, NP  Lancets (FREESTYLE) lancets 2 (two) times daily 02/27/17  Yes Debbrah Alar, NP  lisinopril (PRINIVIL,ZESTRIL) 20 MG tablet TAKE 1 TABLET (20 MG TOTAL) BY MOUTH DAILY. Patient taking differently: Take 20 mg by mouth daily.  02/27/17  Yes Debbrah Alar, NP  Multiple Vitamins-Minerals  (MULTIVITAMIN) tablet Take 1 tablet by mouth daily. 02/12/16  Yes Palumbo, April, MD  ondansetron (ZOFRAN ODT) 4 MG disintegrating tablet Take 1 tablet (4 mg total) by mouth every 6 (six) hours as needed for nausea or vomiting. 07/02/17  Yes Ward, Kristen N, DO  pantoprazole (PROTONIX) 40 MG tablet Take 1 tablet (40 mg total) by mouth 2 (two) times daily. 03/27/17  Yes Barton Dubois, MD  potassium chloride (K-DUR,KLOR-CON) 10 MEQ tablet Take 2 tablets (20 mEq total) by mouth daily. 02/27/17  Yes Debbrah Alar, NP  QUEtiapine (SEROQUEL) 300 MG tablet Take 1 tablet (300 mg total) by mouth at bedtime. 02/27/17  Yes Debbrah Alar, NP  thiamine 100 MG tablet Take 1 tablet (100 mg total) by mouth daily. 02/27/17  Yes Debbrah Alar, NP  Blood Glucose Monitoring Suppl (FREESTYLE FREEDOM LITE) w/Device KIT 2 (two) times  daily 02/27/17   Debbrah Alar, NP    Family History Family History  Problem Relation Age of Onset  . Diabetes Mother   . Lung cancer Brother        twin brother  . Pancreatic cancer Paternal Aunt   . Colon cancer Neg Hx   . Stomach cancer Neg Hx     Social History Social History   Tobacco Use  . Smoking status: Former Smoker    Packs/day: 0.25    Years: 1.00    Pack years: 0.25    Types: Cigarettes    Start date: 04/29/2016  . Smokeless tobacco: Never Used  . Tobacco comment: 4 Cigarettes a day   Substance Use Topics  . Alcohol use: Yes  . Drug use: No    Comment: none in years     Allergies   Invokamet [canagliflozin-metformin hcl] and Metformin and related   Review of Systems Review of Systems  Constitutional: Negative for chills and fever.  HENT: Negative for ear pain, nosebleeds and sore throat.   Eyes: Negative for pain and visual disturbance.  Respiratory: Negative for cough and shortness of breath.   Cardiovascular: Negative for chest pain and palpitations.  Gastrointestinal: Positive for abdominal pain, hematochezia and vomiting.  Negative for hematemesis.  Genitourinary: Negative for dysuria and hematuria.  Musculoskeletal: Negative for arthralgias and back pain.  Skin: Negative for color change and rash.  Neurological: Positive for dizziness and light-headedness. Negative for seizures, loss of consciousness and syncope.  All other systems reviewed and are negative.    Physical Exam Updated Vital Signs BP (!) 144/109 (BP Location: Left Arm)   Pulse (!) 114   Temp 98.6 F (37 C) (Oral)   Resp 20   Ht 6' (1.829 m)   Wt 95.3 kg (210 lb)   BMI 28.48 kg/m   Physical Exam  Constitutional: He appears well-developed and well-nourished.  HENT:  Head: Normocephalic and atraumatic.  Eyes: Conjunctivae are normal.  Neck: Neck supple.  Cardiovascular: Regular rhythm. Tachycardia present.  No murmur heard. Pulmonary/Chest: Effort normal and breath sounds normal. No tachypnea. No respiratory distress.  Abdominal: Soft. Normal appearance. He exhibits no mass. There is tenderness in the right lower quadrant and left lower quadrant. There is no rigidity and no guarding. No hernia.  Musculoskeletal: He exhibits no edema, tenderness or deformity.  Neurological: He is alert.  Skin: Skin is warm and dry.  Psychiatric: He has a normal mood and affect.  Nursing note and vitals reviewed.    ED Treatments / Results  Labs (all labs ordered are listed, but only abnormal results are displayed) Labs Reviewed  COMPREHENSIVE METABOLIC PANEL - Abnormal; Notable for the following components:      Result Value   Chloride 100 (*)    Glucose, Bld 115 (*)    Calcium 8.6 (*)    AST 220 (*)    ALT 178 (*)    All other components within normal limits  CBC - Abnormal; Notable for the following components:   RBC 4.06 (*)    Hemoglobin 12.4 (*)    HCT 35.1 (*)    All other components within normal limits  OCCULT BLOOD X 1 CARD TO LAB, STOOL - Abnormal; Notable for the following components:   Fecal Occult Bld POSITIVE (*)    All  other components within normal limits  CBC - Abnormal; Notable for the following components:   RBC 3.86 (*)    Hemoglobin 11.7 (*)  HCT 33.9 (*)    All other components within normal limits  CBC - Abnormal; Notable for the following components:   RBC 3.69 (*)    Hemoglobin 11.2 (*)    HCT 32.3 (*)    All other components within normal limits  COMPREHENSIVE METABOLIC PANEL - Abnormal; Notable for the following components:   Potassium 3.4 (*)    Glucose, Bld 200 (*)    BUN 5 (*)    Calcium 8.5 (*)    Total Protein 5.9 (*)    Albumin 3.0 (*)    AST 114 (*)    ALT 129 (*)    All other components within normal limits  GLUCOSE, CAPILLARY - Abnormal; Notable for the following components:   Glucose-Capillary 129 (*)    All other components within normal limits  GLUCOSE, CAPILLARY - Abnormal; Notable for the following components:   Glucose-Capillary 346 (*)    All other components within normal limits  CBC - Abnormal; Notable for the following components:   WBC 2.0 (*)    RBC 3.75 (*)    Hemoglobin 11.5 (*)    HCT 33.4 (*)    All other components within normal limits  GLUCOSE, CAPILLARY - Abnormal; Notable for the following components:   Glucose-Capillary 416 (*)    All other components within normal limits  MAGNESIUM - Abnormal; Notable for the following components:   Magnesium 1.6 (*)    All other components within normal limits  GLUCOSE, CAPILLARY - Abnormal; Notable for the following components:   Glucose-Capillary 121 (*)    All other components within normal limits  PROTIME-INR  LIPASE, BLOOD  HIV ANTIBODY (ROUTINE TESTING)  GLUCOSE, CAPILLARY  PLATELET COUNT  HEPATITIS PANEL, ACUTE  CBC  COMPREHENSIVE METABOLIC PANEL  MAGNESIUM  POC OCCULT BLOOD, ED  I-STAT CG4 LACTIC ACID, ED    EKG  EKG Interpretation  Date/Time:  Friday July 04 2017 13:37:12 EST Ventricular Rate:  94 PR Interval:    QRS Duration: 87 QT Interval:  375 QTC Calculation: 469 R  Axis:   55 Text Interpretation:  Sinus rhythm no acute st/ts similar pattern to prior 2/19 Confirmed by Aletta Edouard [phone]) on 07/04/2017 1:48:06 PM       Radiology Ct Abdomen Pelvis W Contrast  Result Date: 07/04/2017 CLINICAL DATA:  Bright red rectal bleeding since this morning, history diabetes mellitus, hypertension, folic pancreatitis EXAM: CT ABDOMEN AND PELVIS WITH CONTRAST TECHNIQUE: Multidetector CT imaging of the abdomen and pelvis was performed using the standard protocol following bolus administration of intravenous contrast. Sagittal and coronal MPR images reconstructed from axial data set. CONTRAST:  164m ISOVUE-300 IOPAMIDOL (ISOVUE-300) INJECTION 61% IV. No oral contrast. COMPARISON:  03/26/2017 FINDINGS: Lower chest: Lung bases clear Hepatobiliary: Fatty infiltration of liver. Gallbladder and liver otherwise normal appearance Pancreas: Atrophic with mild dilatation of the pancreatic duct up to 6 mm diameter. No definite pancreatic mass. Few calcifications at pancreatic head/uncinate. Spleen: Normal appearance.  Small splenule. Adrenals/Urinary Tract: Adrenal glands normal appearance. Kidneys, ureters, and bladder normal appearance. Stomach/Bowel: Normal appendix. Wall thickening of a segment of the mid transverse colon question colitis versus mass. Minimal distal colonic diverticulosis without evidence of diverticulitis. Stomach and bowel loops otherwise unremarkable for technique. Vascular/Lymphatic: Vascular structures unremarkable. No adenopathy. Reproductive: Unremarkable Other: No free air or free fluid. LEFT small LEFT inguinal hernia containing fat. Small umbilical hernia containing fat. Musculoskeletal: Normal appearance IMPRESSION: Segmental wall thickening of the mid transverse colon which could represent segmental colitis or mass;  correlation with colonoscopy recommended. Minimal distal colonic diverticulosis without evidence of diverticulitis. Fatty infiltration of liver.  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 LEFT inguinal and umbilical hernias containing fat. Electronically Signed   By: Lavonia Dana M.D.   On: 07/04/2017 13:17    Procedures Procedures (including critical care time)  Medications Ordered in ED Medications  morphine 4 MG/ML injection 4 mg (not administered)  sodium chloride 0.9 % bolus 1,000 mL (not administered)  ondansetron (ZOFRAN) injection 4 mg (not administered)  pantoprazole (PROTONIX) injection 40 mg (not administered)     Initial Impression / Assessment and Plan / ED Course  I have reviewed the triage vital signs and the nursing notes.  Pertinent labs & imaging results that were available during my care of the patient were reviewed by me and considered in my medical decision making (see chart for details).  Clinical Course as of Jul 06 1911  Fri Jul 04, 2017  1355 Discussed with Dr. Elmo Putt [facility] -he recommends that the patient be admitted to [facility] for at least observation with consideration that he may need a colonoscopy.  He recommends that the hospitalist team consult GI and they will see him.  [facility] is paging the hospitalist at [facility] long to call back.  [MB]  5035 Discussed with was a long hospitalist Dr. Mitchel Honour who will accept the patient in transfer to [facility].  [MB]    Clinical Course User Index [MB] Hayden Rasmussen, MD     Final Clinical Impressions(s) / ED Diagnoses   Final diagnoses:  Rectal bleeding  Noninfectious gastroenteritis, unspecified type    ED Discharge Orders    None       Hayden Rasmussen, MD 07/05/17 [phone]

## 2017-07-04 NOTE — ED Triage Notes (Signed)
Pt reports rectal bleeding with frank red blood and clots since this morning.

## 2017-07-04 NOTE — ED Notes (Signed)
ED Provider at bedside. 

## 2017-07-04 NOTE — Progress Notes (Signed)
Subjective:    Patient ID: Willie [name], male    DOB: 07-20-1975, 42 y.o.   MRN: [id number]  HPI  Willie [name] is a 42 yr old male who presents today with chief complaint of rectal bleeding. Reports that he had rectal bleeding, noted in his underwear and bled through his paints. Blood is bright red with black clots.  Reports some rectal pressure. Reports that if he passes gas "I bleed."  Has to put towel on the couch. If I pass gas then I am bleeding. Reports + hx of hemorrhoids and had to reschedule his banding appointment due to recent viral illness.  Reports 10 episodes of BRBPR and "changing my underwear 12 times so far today."   Of not he had colo back in 2012.  Noted mild diverticulosis and mild erythema in the left colon.    Review of Systems See HPI  Past Medical History:  Diagnosis Date  . Alcohol abuse   . Alcoholic pancreatitis    recurrent  . Depression   . Diabetes mellitus, type II ([place])    New Onset 03/2010  . GERD (gastroesophageal reflux disease)   . High cholesterol   . 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 Hypertension   . Sleep apnea    does not wear a CPAP     Social History   Socioeconomic History  . Marital status: Married    Spouse name: Not on file  . Number of children: 2  . Years of education: Not on file  . Highest education level: Not on file  Social Needs  . Financial resource strain: Not on file  . Food insecurity - worry: Not on file  . Food insecurity - inability: Not on file  . Transportation needs - medical: Not on file  . Transportation needs - non-medical: Not on file  Occupational History  . Occupation: Landscape architect: [employer]  Tobacco Use  . Smoking status: Current Every Day Smoker    Packs/day: 0.25    Years: 1.00    Pack years: 0.25    Types: Cigarettes    Start date: 04/29/2016  . Smokeless tobacco: Never Used  . Tobacco comment:  4 Cigarettes a day   Substance and Sexual Activity  . Alcohol use: Yes  . Drug use: No    Comment: none in years  . Sexual activity: Not on file  Other Topics Concern  . 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 COLONOSCOPY W/ BIOPSIES  09/2010   Dr. Ardis Hughs.  for intermittent rectal bleeding.  Mild sigmoid to descending diverticulosis.  Mild left colon erythema, benign biopsy, probably "prep effect"  . LUMBAR MICRODISCECTOMY  ~ 2004   Dr Ellene Route    Family History  Problem Relation Age of Onset  . Diabetes Mother   . Lung cancer Brother        twin brother  . Pancreatic cancer Paternal Aunt   . Colon cancer Neg Hx   . Stomach cancer Neg Hx     Allergies  Allergen  Reactions  . Invokamet [Canagliflozin-Metformin Hcl] Other (See Comments)    Lactic acidosis  . Metformin And Related Other (See Comments)    DRASTIC drop in blood sugar    Current Outpatient Medications on File Prior to Visit  Medication Sig Dispense Refill  . amLODipine (NORVASC) 10 MG tablet Take 1 tablet (10 mg total) by mouth daily. 90 tablet 1  . atorvastatin (LIPITOR) 20 MG tablet Take 1 tablet (20 mg total) by mouth daily. 90 tablet 1  . Blood Glucose Monitoring Suppl (FREESTYLE FREEDOM LITE) w/Device KIT 2 (two) times daily 1 each 2  . cloNIDine (CATAPRES) 0.1 MG tablet Take 1 tablet (0.1 mg total) by mouth 3 (three) times daily. 270 tablet 1  . escitalopram (LEXAPRO) 20 MG tablet Take 1 tablet (20 mg total) by mouth daily. 90 tablet 1  . glucose blood (FREESTYLE LITE) test strip Use as instructed 100 each 12  . hydrocortisone (ANUSOL-HC) 25 MG suppository Place 1 suppository (25 mg total) rectally 2 (two) times daily. 12 suppository 0  . hydrOXYzine (ATARAX/VISTARIL) 50  MG tablet Take 1 tablet (50 mg total) by mouth 3 (three) times daily as needed. (Patient taking differently: Take 50 mg by mouth 3 (three) times daily as needed for anxiety. ) 270 tablet 1  . Insulin Glargine (LANTUS SOLOSTAR) 100 UNIT/ML Solostar Pen Inject 40 Units into the skin daily at 10 pm. (Patient taking differently: Inject 35 Units into the skin at bedtime. ) 15 mL 0  . insulin lispro (HUMALOG KWIKPEN) 100 UNIT/ML KiwkPen 3 times a day (just before each meal) 09-10-08 units, (Patient taking differently: Inject 5-15 Units into the skin See admin instructions. 15 units into the skin with breakfast then 5 units with lunch then 15 units with dinner (evening meal)) 15 mL 4  . Insulin Pen Needle (ULTICARE SHORT PEN NEEDLES) 31G X 8 MM MISC Use once daily to inject insulin. 100 each 1  . Lancets (FREESTYLE) lancets 2 (two) times daily 200 each 5  . lisinopril (PRINIVIL,ZESTRIL) 20 MG tablet TAKE 1 TABLET (20 MG TOTAL) BY MOUTH DAILY. (Patient taking differently: Take 20 mg by mouth daily. ) 90 tablet 1  . Multiple Vitamins-Minerals (MULTIVITAMIN) tablet Take 1 tablet by mouth daily. 30 tablet 0  . ondansetron (ZOFRAN ODT) 4 MG disintegrating tablet Take 1 tablet (4 mg total) by mouth every 6 (six) hours as needed for nausea or vomiting. 20 tablet 0  . pantoprazole (PROTONIX) 40 MG tablet Take 1 tablet (40 mg total) by mouth 2 (two) times daily. 60 tablet 1  . potassium chloride (K-DUR,KLOR-CON) 10 MEQ tablet Take 2 tablets (20 mEq total) by mouth daily. 180 tablet 1  . QUEtiapine (SEROQUEL) 300 MG tablet Take 1 tablet (300 mg total) by mouth at bedtime. 90 tablet 1  . thiamine 100 MG tablet Take 1 tablet (100 mg total) by mouth daily. 90 tablet 1   No current facility-administered medications on file prior to visit.     BP (!) 146/104 (BP Location: Right Arm, Patient Position: Sitting, Cuff Size: Large)   Pulse (!) 121   Temp 98.6 F (37 C) (Oral)   Resp 16   Ht 6' (1.829 m)   Wt 210 lb 3.2 oz  (95.3 kg)   SpO2 100%   BMI 28.51 kg/m       Objective:   Physical Exam  Constitutional: He is oriented to person, place, and time. He appears well-developed and well-nourished. No distress.  HENT:  Head: Normocephalic  and atraumatic.  Cardiovascular: Regular rhythm. Tachycardia present.  No murmur heard. Pulmonary/Chest: Effort normal and breath sounds normal. No respiratory distress. He has no wheezes. He has no rales.  Musculoskeletal: He exhibits no edema.  Neurological: He is alert and oriented to person, place, and time.  Skin: Skin is warm and dry.  Psychiatric: He has a normal mood and affect. His behavior is normal. Thought content normal.  Rectal: BRBPR noted, grossly positive hemocult, otherwise normal DRE      Assessment & Plan:  Hemorrhoidal versus diverticular bleed- Patient is tachycardic and his hemoglobin has dropped from 14.1 to 12.3 per hemacue done in office. Due to active ongoing bleed, drop in hemoglobin and tachycardia- will refer him to the ED for further evaluation. Report given to [facility] Melina Copa at the [facility] ED.

## 2017-07-05 DIAGNOSIS — I10 Essential (primary) hypertension: Secondary | ICD-10-CM

## 2017-07-05 DIAGNOSIS — R945 Abnormal results of liver function studies: Secondary | ICD-10-CM

## 2017-07-05 DIAGNOSIS — R933 Abnormal findings on diagnostic imaging of other parts of digestive tract: Secondary | ICD-10-CM

## 2017-07-05 DIAGNOSIS — R710 Precipitous drop in hematocrit: Secondary | ICD-10-CM

## 2017-07-05 LAB — GLUCOSE, CAPILLARY
GLUCOSE-CAPILLARY: 96 mg/dL (ref 65–99)
GLUCOSE-CAPILLARY: 416 mg/dL — AB (ref 65–99)
Glucose-Capillary: 121 mg/dL — ABNORMAL HIGH (ref 65–99)
Glucose-Capillary: 215 mg/dL — ABNORMAL HIGH (ref 65–99)

## 2017-07-05 LAB — CBC
HCT: 33.4 % — ABNORMAL LOW (ref 39.0–52.0)
HEMATOCRIT: 32.3 % — AB (ref 39.0–52.0)
Hemoglobin: 11.2 g/dL — ABNORMAL LOW (ref 13.0–17.0)
Hemoglobin: 11.5 g/dL — ABNORMAL LOW (ref 13.0–17.0)
MCH: 30.4 pg (ref 26.0–34.0)
MCH: 30.7 pg (ref 26.0–34.0)
MCHC: 34.7 g/dL (ref 30.0–36.0)
MCHC: 34.4 g/dL (ref 30.0–36.0)
MCV: 87.5 fL (ref 78.0–100.0)
MCV: 89.1 fL (ref 78.0–100.0)
Platelets: 191 10*3/uL (ref 150–400)
RBC: 3.69 MIL/uL — AB (ref 4.22–5.81)
RBC: 3.75 MIL/uL — ABNORMAL LOW (ref 4.22–5.81)
RDW: 13.7 % (ref 11.5–15.5)
RDW: 13.7 % (ref 11.5–15.5)
WBC: 5.7 10*3/uL (ref 4.0–10.5)
WBC: 2 10*3/uL — ABNORMAL LOW (ref 4.0–10.5)

## 2017-07-05 LAB — COMPREHENSIVE METABOLIC PANEL
ALK PHOS: 96 U/L (ref 38–126)
ALT: 129 U/L — AB (ref 17–63)
AST: 114 U/L — AB (ref 15–41)
Albumin: 3 g/dL — ABNORMAL LOW (ref 3.5–5.0)
Anion gap: 6 (ref 5–15)
BUN: 5 mg/dL — AB (ref 6–20)
CHLORIDE: 104 mmol/L (ref 101–111)
CO2: 28 mmol/L (ref 22–32)
Calcium: 8.5 mg/dL — ABNORMAL LOW (ref 8.9–10.3)
Creatinine, Ser: 0.77 mg/dL (ref 0.61–1.24)
GFR calc Af Amer: >60 mL/min (ref >60)
GFR calc non Af Amer: >60 mL/min (ref >60)
GLUCOSE: 200 mg/dL — AB (ref 65–99)
Potassium: 3.4 mmol/L — ABNORMAL LOW (ref 3.5–5.1)
SODIUM: 138 mmol/L (ref 135–145)
Total Bilirubin: 0.5 mg/dL (ref 0.3–1.2)
Total Protein: 5.9 g/dL — ABNORMAL LOW (ref 6.5–8.1)

## 2017-07-05 LAB — MAGNESIUM: MAGNESIUM: 1.6 mg/dL — AB (ref 1.7–2.4)

## 2017-07-05 LAB — PLATELET COUNT: Platelets: 223 10*3/uL (ref 150–400)

## 2017-07-05 LAB — HIV ANTIBODY (ROUTINE TESTING W REFLEX): HIV SCREEN 4TH GENERATION: NONREACTIVE

## 2017-07-05 MED ORDER — PEG-KCL-NACL-NASULF-NA ASC-C 100 G PO SOLR
0.5000 | Freq: Once | ORAL | Status: AC
  Administered 2017-07-06: 100 g via ORAL
  Filled 2017-07-05: qty 1

## 2017-07-05 MED ORDER — PEG-KCL-NACL-NASULF-NA ASC-C 100 G PO SOLR: 1.0000 | Freq: Once | ORAL | Status: DC

## 2017-07-05 MED ORDER — PEG-KCL-NACL-NASULF-NA ASC-C 100 G PO SOLR
0.5000 | Freq: Once | ORAL | Status: AC
  Administered 2017-07-05: 100 g via ORAL
  Filled 2017-07-05: qty 1

## 2017-07-05 MED ORDER — MAGNESIUM SULFATE 2 GM/50ML IV SOLN
2.0000 g | Freq: Once | INTRAVENOUS | Status: AC
  Administered 2017-07-05: 2 g via INTRAVENOUS
  Filled 2017-07-05: qty 50

## 2017-07-05 MED ORDER — BISACODYL 5 MG PO TBEC
10.0000 mg | DELAYED_RELEASE_TABLET | Freq: Once | ORAL | Status: AC
  Administered 2017-07-05: 10 mg via ORAL
  Filled 2017-07-05: qty 2

## 2017-07-05 MED ORDER — HYDROCORTISONE ACETATE 25 MG RE SUPP
25.0000 mg | Freq: Two times a day (BID) | RECTAL | Status: DC
  Administered 2017-07-05 – 2017-07-07 (×2): 25 mg via RECTAL
  Filled 2017-07-05 (×5): qty 1

## 2017-07-05 MED ORDER — PANTOPRAZOLE SODIUM 40 MG PO TBEC
40.0000 mg | DELAYED_RELEASE_TABLET | Freq: Every day | ORAL | Status: DC
  Administered 2017-07-05 – 2017-07-07 (×3): 40 mg via ORAL
  Filled 2017-07-05 (×3): qty 1

## 2017-07-05 MED ORDER — POTASSIUM CHLORIDE CRYS ER 20 MEQ PO TBCR
40.0000 meq | EXTENDED_RELEASE_TABLET | Freq: Once | ORAL | Status: AC
  Administered 2017-07-05: 40 meq via ORAL
  Filled 2017-07-05: qty 2

## 2017-07-05 NOTE — Progress Notes (Signed)
 [facility] PROGRESS NOTE  Didier M Pham MRN:[id number] DOB: 11/12/1975 DOA: 07/04/2017  PCP: O'Sullivan, [name], NP  Brief History/Interval Summary: 42-year-old male with a past medical history of essential hypertension, diabetes, alcoholism, chronic pancreatitis presented with rectal bleeding.  Concern was for hemorrhoidal versus ischemic colitis.  Patient was hospitalized for further management.  Reason for Visit: Hematochezia  Consultants: Gastroenterology  Procedures: Colonoscopy is planned for tomorrow  Antibiotics: None  Subjective/Interval History: Mentioned that he has had a few loose episodes of soft stool mixed with blood.  Denies any watery diarrhea.  Continues to have abdominal pain.  No nausea or vomiting.  ROS: Denies any chest pain or shortness of breath.  Objective:  Vital Signs  Vitals:   07/04/17 2012 07/05/17 0006 07/05/17 0417 07/05/17 0732  BP: (!) 144/82 116/85 (!) 149/86 (!) 161/104  Pulse: 97 93 73 65  Resp: 17 18 18 18  Temp: 98.5 F (36.9 C) 97.7 F (36.5 C) 98.1 F (36.7 C) 97.7 F (36.5 C)  TempSrc: Oral Oral Oral Oral  SpO2: 99% 100% 100% 100%  Weight:      Height:        Intake/Output Summary (Last 24 hours) at 07/05/2017 1246 Last data filed at 07/05/2017 1023 Gross per 24 hour  Intake 2206.25 ml  Output -  Net 2206.25 ml   Filed Weights   07/04/17 1211 07/04/17 1702  Weight: 95.3 kg (210 lb) 94.5 kg (208 lb 5.4 oz)    General appearance: alert, cooperative, appears stated age and no distress Head: Normocephalic, without obvious abnormality, atraumatic Resp: clear to auscultation bilaterally Cardio: regular rate and rhythm, S1, S2 normal, no murmur, click, rub or gallop GI: Abdomen is soft.  Mildly tender diffusely without any rebound rigidity or guarding.  No masses organomegaly.  Bowel sounds are present normal. Extremities: extremities normal, atraumatic, no cyanosis or edema Neurologic: Awake alert.  No focal  neurological deficits.  Lab Results:  Data Reviewed: I have personally reviewed following labs and imaging studies  CBC: Recent Labs  Lab 07/01/17 2125 07/04/17 1124 07/04/17 1226 07/04/17 1825 07/05/17 0128  WBC 11.0*  --  8.0 7.2 5.7  HGB 14.1 12.3* 12.4* 11.7* 11.2*  HCT 38.2*  --  35.1* 33.9* 32.3*  MCV 82.7  --  86.5 87.8 87.5  PLT 381  --  215 192 191    Basic Metabolic Panel: Recent Labs  Lab 07/01/17 2125 07/04/17 1226 07/05/17 0128  NA 132* 135 138  K 4.3 3.9 3.4*  CL 90* 100* 104  CO2 23 27 28  GLUCOSE 307* 115* 200*  BUN 7 7 5*  CREATININE 1.11 0.84 0.77  CALCIUM 8.6* 8.6* 8.5*    GFR: Estimated Creatinine Clearance: 146.5 mL/min (by C-G formula based on SCr of 0.77 mg/dL).  Liver Function Tests: Recent Labs  Lab 07/01/17 2125 07/04/17 1226 07/05/17 0128  AST 261* 220* 114*  ALT 167* 178* 129*  ALKPHOS 146* 124 96  BILITOT 1.7* 0.6 0.5  PROT 7.6 6.9 5.9*  ALBUMIN 3.9 3.7 3.0*    Recent Labs  Lab 07/01/17 2125 07/04/17 1226  LIPASE 19 17   Recent Labs  Lab 07/01/17 2233  AMMONIA 41*    Coagulation Profile: Recent Labs  Lab 07/04/17 1226  INR 0.96    CBG: Recent Labs  Lab 07/02/17 0318 07/04/17 1754 07/04/17 2027 07/05/17 0730 07/05/17 1134  GLUCAP 207* 129* 346* 96 416*     Radiology Studies: Ct Abdomen Pelvis W Contrast    Result Date: 07/04/2017 CLINICAL DATA:  Bright red rectal bleeding since this morning, history diabetes mellitus, hypertension, folic pancreatitis EXAM: CT ABDOMEN AND PELVIS WITH CONTRAST TECHNIQUE: Multidetector CT imaging of the abdomen and pelvis was performed using the standard protocol following bolus administration of intravenous contrast. Sagittal and coronal MPR images reconstructed from axial data set. CONTRAST:  100mL ISOVUE-300 IOPAMIDOL (ISOVUE-300) INJECTION 61% IV. No oral contrast. COMPARISON:  03/26/2017 FINDINGS: Lower chest: Lung bases clear Hepatobiliary: Fatty infiltration of liver.  Gallbladder and liver otherwise normal appearance Pancreas: Atrophic with mild dilatation of the pancreatic duct up to 6 mm diameter. No definite pancreatic mass. Few calcifications at pancreatic head/uncinate. Spleen: Normal appearance.  Small splenule. Adrenals/Urinary Tract: Adrenal glands normal appearance. Kidneys, ureters, and bladder normal appearance. Stomach/Bowel: Normal appendix. Wall thickening of a segment of the mid transverse colon question colitis versus mass. Minimal distal colonic diverticulosis without evidence of diverticulitis. Stomach and bowel loops otherwise unremarkable for technique. Vascular/Lymphatic: Vascular structures unremarkable. No adenopathy. Reproductive: Unremarkable Other: No free air or free fluid. LEFT small LEFT inguinal hernia containing fat. Small umbilical hernia containing fat. Musculoskeletal: Normal appearance IMPRESSION: Segmental wall thickening of the mid transverse colon which could represent segmental colitis or mass; correlation with colonoscopy recommended. Minimal distal colonic diverticulosis without evidence of diverticulitis. Fatty infiltration of liver. 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 LEFT inguinal and umbilical hernias containing fat. Electronically Signed   By: Mark  Boles M.D.   On: 07/04/2017 13:17     Medications:  Scheduled: . amLODipine  10 mg Oral Daily  . cloNIDine  0.1 mg Oral TID  . escitalopram  20 mg Oral Daily  . folic acid  1 mg Oral Daily  . hydrocortisone  25 mg Rectal BID  . insulin aspart  0-15 Units Subcutaneous TID WC  . insulin glargine  10 Units Subcutaneous QHS  . lisinopril  20 mg Oral Daily  . LORazepam  0-4 mg Oral Q6H   Followed by  . [START ON 07/06/2017] LORazepam  0-4 mg Oral Q12H  . multivitamin with minerals  1 tablet Oral Daily  . pantoprazole  40 mg Oral Q0600  . peg 3350 powder  0.5 kit Oral Once   And  . [START ON 07/06/2017] peg 3350 powder  0.5 kit Oral  Once  . QUEtiapine  300 mg Oral QHS  . thiamine  100 mg Oral Daily   Or  . thiamine  100 mg Intravenous Daily   Continuous: . sodium chloride 75 mL/hr at 07/04/17 1839   PRN:hydrALAZINE, LORazepam **OR** LORazepam, morphine injection, ondansetron **OR** ondansetron (ZOFRAN) IV, oxyCODONE  Assessment/Plan:  Principal Problem:   Lower GI bleed Active Problems:   Diabetes type 2, uncontrolled (HCC)   Essential hypertension   Chronic pancreatitis (HCC)   Alcohol abuse   Anemia, blood loss   Transaminitis    Hematochezia Patient continues to have small amounts of bleeding per rectum.  He is also experiencing gray colored stools.  Though this is not watery diarrhea.  Hematochezia thought to be multifactorial including hemorrhoidal and also a component of perhaps ischemic colitis.  Gastroenterology is following.  Patient's WBC is normal.  He is being monitored off of antibiotics.  Plan is for colonoscopy during this hospitalization.  Mild drop in hemoglobin is noted.  Findings of colitis on CT scan Involves an area which raises concern for this being ischemic colitis.  There is also concern for a mass in the transverse colon.  As discussed   above colonoscopy is planned for this hospital stay.  Abdominal pain probably due to colitis.  Continue with analgesic agents.   Acute blood loss anemia Drop in hemoglobin noted but not severe.  He does not need blood transfusion as yet.  Continue to monitor closely.  INR was normal.  Hypokalemia Will be repleted.  Check magnesium  Alcohol abuse History of heavy consumption of beer.  Last consumption was day prior to admission.  Continue with CIWA protocol.  Thiamine and folic acid.  Alcohol cessation counseling.    Transaminitis Probably due to alcohol use/hepatitis.  LFTs are better today.  Fatty infiltration of liver noted on CT scan.  Gallbladder was normal in appearance.  Holding statin.    Diabetes mellitus type 2, uncontrolled Last  HbA1c 8.4 in February.  Patient placed on Lantus as well as sliding scale coverage.  CBGs poorly controlled part of which is due to dietary noncompliance.  He is on clear liquids but has been having juices and other beverages with sugar.  This will be changed to a sugar-free clear liquid diet.  May have to give additional dose of Lantus or increase his nighttime dose.    Essential hypertension Blood pressure is better controlled compared to yesterday.  Continue with his home medications.  He is on clonidine, amlodipine and lisinopril.  Hydralazine as needed.  Chronic pancreatitis Pancreatic changes noted on CT scan.  This is most likely due to alcohol use.  Lipase has been normal.  Incidental finding of umbilical and inguinal hernias Does not appear to be the reason for his current symptoms.  History of depression Continue with home medications.  DVT Prophylaxis: SCDs    Code Status: Full code Family Communication: Discussed with the patient Disposition Plan: Management as outlined above.  Colonoscopy this hospital stay.    LOS: 1 day   [facility] Pager [phone] 07/05/2017, 12:46 PM  If 7PM-7AM, please contact night-coverage at [email or web address], password [facility]

## 2017-07-06 ENCOUNTER — Inpatient Hospital Stay (HOSPITAL_COMMUNITY): Payer: No Typology Code available for payment source | Admitting: Certified Registered Nurse Anesthetist

## 2017-07-06 ENCOUNTER — Encounter (HOSPITAL_COMMUNITY): Payer: Self-pay | Admitting: Internal Medicine

## 2017-07-06 ENCOUNTER — Encounter (HOSPITAL_COMMUNITY)
Admission: EM | Disposition: A | Discharge status: Home / Self Care | Payer: Self-pay | Source: Home / Self Care | Attending: Internal Medicine

## 2017-07-06 DIAGNOSIS — K5730 Diverticulosis of large intestine without perforation or abscess without bleeding: Secondary | ICD-10-CM

## 2017-07-06 DIAGNOSIS — R933 Abnormal findings on diagnostic imaging of other parts of digestive tract: Secondary | ICD-10-CM

## 2017-07-06 DIAGNOSIS — D124 Benign neoplasm of descending colon: Secondary | ICD-10-CM

## 2017-07-06 DIAGNOSIS — K648 Other hemorrhoids: Secondary | ICD-10-CM

## 2017-07-06 HISTORY — PX: COLONOSCOPY WITH PROPOFOL: SHX5780

## 2017-07-06 LAB — COMPREHENSIVE METABOLIC PANEL
ALK PHOS: 99 U/L (ref 38–126)
ALT: 127 U/L — AB (ref 17–63)
AST: 92 U/L — AB (ref 15–41)
Albumin: 3.2 g/dL — ABNORMAL LOW (ref 3.5–5.0)
Anion gap: 8 (ref 5–15)
CHLORIDE: 103 mmol/L (ref 101–111)
CO2: 28 mmol/L (ref 22–32)
CREATININE: 0.62 mg/dL (ref 0.61–1.24)
Calcium: 8.8 mg/dL — ABNORMAL LOW (ref 8.9–10.3)
GFR calc Af Amer: >60 mL/min (ref >60)
GFR calc non Af Amer: >60 mL/min (ref >60)
Glucose, Bld: 202 mg/dL — ABNORMAL HIGH (ref 65–99)
Potassium: 3.7 mmol/L (ref 3.5–5.1)
SODIUM: 139 mmol/L (ref 135–145)
Total Bilirubin: 0.5 mg/dL (ref 0.3–1.2)
Total Protein: 6.2 g/dL — ABNORMAL LOW (ref 6.5–8.1)

## 2017-07-06 LAB — CBC
HCT: 33.4 % — ABNORMAL LOW (ref 39.0–52.0)
Hemoglobin: 11.2 g/dL — ABNORMAL LOW (ref 13.0–17.0)
MCH: 30 pg (ref 26.0–34.0)
MCHC: 33.5 g/dL (ref 30.0–36.0)
MCV: 89.5 fL (ref 78.0–100.0)
Platelets: 207 10*3/uL (ref 150–400)
RBC: 3.73 MIL/uL — ABNORMAL LOW (ref 4.22–5.81)
RDW: 13.8 % (ref 11.5–15.5)
WBC: 4.7 10*3/uL (ref 4.0–10.5)

## 2017-07-06 LAB — MAGNESIUM: MAGNESIUM: 1.9 mg/dL (ref 1.7–2.4)

## 2017-07-06 LAB — GLUCOSE, CAPILLARY
GLUCOSE-CAPILLARY: 401 mg/dL — AB (ref 65–99)
GLUCOSE-CAPILLARY: 142 mg/dL — AB (ref 65–99)
Glucose-Capillary: 116 mg/dL — ABNORMAL HIGH (ref 65–99)
Glucose-Capillary: 102 mg/dL — ABNORMAL HIGH (ref 65–99)

## 2017-07-06 SURGERY — COLONOSCOPY WITH PROPOFOL
Anesthesia: Monitor Anesthesia Care

## 2017-07-06 MED ORDER — LIDOCAINE 2% (20 MG/ML) 5 ML SYRINGE
INTRAMUSCULAR | Status: DC | PRN
  Administered 2017-07-06: 100 mg via INTRAVENOUS

## 2017-07-06 MED ORDER — PROPOFOL 10 MG/ML IV BOLUS
INTRAVENOUS | Status: DC | PRN
  Administered 2017-07-06: 10 mg via INTRAVENOUS
  Administered 2017-07-06: 40 mg via INTRAVENOUS

## 2017-07-06 MED ORDER — LACTATED RINGERS IV SOLN
INTRAVENOUS | Status: DC
Start: 2017-07-06 — End: 2017-07-06
  Administered 2017-07-06: 1000 mL via INTRAVENOUS

## 2017-07-06 MED ORDER — INSULIN GLARGINE 100 UNIT/ML ~~LOC~~ SOLN
25.0000 [IU] | Freq: Every day | SUBCUTANEOUS | Status: DC
  Administered 2017-07-06: 25 [IU] via SUBCUTANEOUS
  Filled 2017-07-06 (×2): qty 0.25

## 2017-07-06 MED ORDER — PROPOFOL 500 MG/50ML IV EMUL
INTRAVENOUS | Status: DC | PRN
  Administered 2017-07-06: 150 ug/kg/min via INTRAVENOUS

## 2017-07-06 MED ORDER — INSULIN GLARGINE 100 UNIT/ML ~~LOC~~ SOLN: 25.0000 [IU] | Freq: Every day | SUBCUTANEOUS | Status: DC

## 2017-07-06 MED ORDER — PROPOFOL 10 MG/ML IV BOLUS
INTRAVENOUS | Status: AC
  Filled 2017-07-06: qty 40

## 2017-07-06 SURGICAL SUPPLY — 21 items

## 2017-07-06 NOTE — Op Note (Signed)
[facility] Patient Name: Willie [name] Procedure Date: 07/06/2017 MRN: [id number] Attending MD: Jerene Bears , MD Date of Birth: [birth year] CSN: [id number] Age: 42 Admit Type: Inpatient Procedure:                Colonoscopy Indications:              Hematochezia, Acute post hemorrhagic anemia,                            Abnormal CT of the GI tract (transverse colitis) Providers:                Lajuan Lines. Hilarie Fredrickson, MD, Carolynn Comment RN, RN, Tinnie Gens, Technician, [facility], CRNA Referring MD:             [facility] Medicines:                Monitored Anesthesia Care Complications:            No immediate complications. Estimated Blood Loss:     Estimated blood loss: none. Procedure:                Pre-Anesthesia Assessment:                           - Prior to the procedure, a History and Physical                            was performed, and patient medications and                            allergies were reviewed. The patient's tolerance of                            previous anesthesia was also reviewed. The risks                            and benefits of the procedure and the sedation                            options and risks were discussed with the patient.                            All questions were answered, and informed consent                            was obtained. Prior Anticoagulants: The patient has                            taken no previous anticoagulant or antiplatelet                            agents. ASA Grade Assessment: II - A patient with  mild systemic disease. After reviewing the risks                            and benefits, the patient was deemed in                            satisfactory condition to undergo the procedure.                           After obtaining informed consent, the colonoscope                            was passed under direct vision.  Throughout the                            procedure, the patient's blood pressure, pulse, and                            oxygen saturations were monitored continuously. The                            EC-[id number] ([id number]) scope was introduced through                            the anus and advanced to the the cecum, identified                            by appendiceal orifice and ileocecal valve. The                            colonoscopy was performed without difficulty. The                            patient tolerated the procedure well. The quality                            of the bowel preparation was good. The ileocecal                            valve, appendiceal orifice, and rectum were                            photographed. Scope In: 1:09:12 PM Scope Out: 1:27:05 PM Scope Withdrawal Time: 0 hours 12 minutes 18 seconds  Total Procedure Duration: 0 hours 17 minutes 53 seconds  Findings:      The digital rectal exam findings include non-thrombosed internal       hemorrhoids.      A 4 mm polyp was found in the descending colon. The polyp was sessile.       The polyp was removed with a cold snare. Resection and retrieval were       complete.      Multiple small and large-mouthed diverticula were found in the       descending colon, transverse colon and ascending colon.      Internal  hemorrhoids were found during retroflexion and during digital       exam. The hemorrhoids were medium-sized.      There is no endoscopic evidence of bleeding or inflammation in the       entire colon. Impression:               - One 4 mm polyp in the descending colon, removed                            with a cold snare. Resected and retrieved.                           - Moderate diverticulosis in the descending colon,                            in the transverse colon and in the ascending colon.                           - No evidence of colitis.                           - Internal hemorrhoids.                            - Recent hematochezia with resultant anemia most                            likely resolved diverticular bleeding (hemorrhoidal                            bleeding alone not felt to explain more recent                            hematochezia and anemia) Moderate Sedation:      N/A Recommendation:           - Return patient to hospital ward for ongoing care                            and possible discharge today.                           - Resume previous diet.                           - Continue present medications.                           - Await pathology results.                           - Repeat colonoscopy is recommended. The                            colonoscopy date will be determined after pathology  results from today's exam become available for                            review.                           - Keep scheduled visit for hemorrhoidal banding to                            treat symptomatic, bleeding internal hemorrhoids.                           - Avoid all alcohol given chronic pancreatitis. Procedure Code(s):        --- Professional ---                           [phone], Colonoscopy, flexible; with removal of                            tumor(s), polyp(s), or other lesion(s) by snare                            technique Diagnosis Code(s):        --- Professional ---                           D12.4, Benign neoplasm of descending colon                           K64.8, Other hemorrhoids                           K92.1, Melena (includes Hematochezia)                           D62, Acute posthemorrhagic anemia                           K57.30, Diverticulosis of large intestine without                            perforation or abscess without bleeding                           R93.3, Abnormal findings on diagnostic imaging of                            other parts of digestive tract CPT copyright 2016 American Medical  Association. All rights reserved. The codes documented in this report are preliminary and upon coder review may  be revised to meet current compliance requirements. Jerene Bears, MD 07/06/2017 1:38:45 PM This report has been signed electronically. Number of Addenda: 0

## 2017-07-06 NOTE — Transfer of Care (Signed)
Immediate Anesthesia Transfer of Care Note  Patient: Willie [name]  Procedure(s) Performed: COLONOSCOPY WITH PROPOFOL (N/A )  Patient Location: PACU  Anesthesia Type:MAC  Level of Consciousness: awake, alert  and oriented  Airway & Oxygen Therapy: Patient Spontanous Breathing  Post-op Assessment: Report given to RN and Post -op Vital signs reviewed and stable  Post vital signs: Reviewed and stable  Last Vitals:  Vitals:   07/06/17 1132 07/06/17 1249  BP: (!) 135/102 (!) 139/97  Pulse: 70 79  Resp: 16 12  Temp: 36.7 C 36.9 C  SpO2: 100% 100%    Last Pain:  Vitals:   07/06/17 1249  TempSrc: Oral  PainSc: 8       Patients Stated Pain Goal: 6 (54/62/70 3500)  Complications: No apparent anesthesia complications

## 2017-07-06 NOTE — Anesthesia Preprocedure Evaluation (Signed)
Anesthesia Evaluation  Patient identified by MRN, date of birth, ID band Patient awake    Reviewed: Allergy & Precautions, NPO status , Patient's Chart, lab work & pertinent test results  Airway Mallampati: II  TM Distance: >3 FB Neck ROM: Full    Dental  (+) Dental Advisory Given   Pulmonary sleep apnea , former smoker,    Pulmonary exam normal breath sounds clear to auscultation       Cardiovascular hypertension, Pt. on medications Normal cardiovascular exam Rhythm:Regular Rate:Normal     Neuro/Psych PSYCHIATRIC DISORDERS Anxiety Depression Hx suicide attemptnegative neurological ROS     GI/Hepatic GERD  Medicated and Controlled,(+)     substance abuse  alcohol use, Chronic pancreatitis d/t alcohol abuse; hematochezia   Endo/Other  diabetes, Type 2, Insulin Dependent  Renal/GU negative Renal ROS  negative genitourinary   Musculoskeletal negative musculoskeletal ROS (+)   Abdominal   Peds  Hematology  (+) anemia ,   Anesthesia Other Findings   Reproductive/Obstetrics                             Anesthesia Physical Anesthesia Plan  ASA: III  Anesthesia Plan: MAC   Post-op Pain Management:    Induction: Intravenous  PONV Risk Score and Plan: Propofol infusion and Treatment may vary due to age or medical condition  Airway Management Planned: Simple Face Mask and Natural Airway  Additional Equipment: None  Intra-op Plan:   Post-operative Plan:   Informed Consent: I have reviewed the patients History and Physical, chart, labs and discussed the procedure including the risks, benefits and alternatives for the proposed anesthesia with the patient or authorized representative who has indicated his/her understanding and acceptance.   Dental advisory given  Plan Discussed with: CRNA  Anesthesia Plan Comments:         Anesthesia Quick Evaluation

## 2017-07-06 NOTE — Progress Notes (Signed)
[facility] PROGRESS NOTE  Willie [name] ZOX:[id number] DOB: 09/28/1975 DOA: 07/04/2017  PCP: Debbrah Alar, NP  Brief History/Interval Summary: 42 year old male with a past medical history of essential hypertension, diabetes, alcoholism, chronic pancreatitis presented with rectal bleeding.  Concern was for hemorrhoidal versus ischemic colitis.  Patient was hospitalized for further management.  Reason for Visit: Hematochezia  Consultants: Gastroenterology  Procedures: Colonoscopy is planned for today  Antibiotics: None  Subjective/Interval History: Patient states that he is feeling well.  He states that he continues to have some blood per rectum.  Continues to have abdominal pain.  No nausea vomiting.  ROS: Denies any chest pain or shortness of breath.  Objective:  Vital Signs  Vitals:   07/05/17 1348 07/05/17 2030 07/06/17 0012 07/06/17 0443  BP: 125/82 (!) 153/90 106/83 (!) 121/93  Pulse: 88 92 96 91  Resp: 18 17 18 16   Temp: 98.3 F (36.8 C) 98.2 F (36.8 C) 97.6 F (36.4 C) 98.1 F (36.7 C)  TempSrc: Oral Oral Oral Oral  SpO2: 100% 100% 100% 99%  Weight:      Height:        Intake/Output Summary (Last 24 hours) at 07/06/2017 0959 Last data filed at 07/06/2017 8119 Gross per 24 hour  Intake 3715 ml  Output -  Net 3715 ml   Filed Weights   07/04/17 1211 07/04/17 1702  Weight: 95.3 kg (210 lb) 94.5 kg (208 lb 5.4 oz)    General appearance: Awake alert.  In no distress. Resp: Clear to auscultation bilaterally.  No wheezing rales or rhonchi. Cardio: S1-S2 is normal regular.  No S3-S4.  No rubs murmurs or bruit GI: Abdomen is soft.  Remains tender in the upper abdomen without any rebound rigidity or guarding.  No masses organomegaly.  Bowel sounds are present.   Extremities: No edema Neurologic: Alert and oriented x3.  No focal neurological deficits.  Lab Results:  Data Reviewed: I have personally reviewed following labs and imaging  studies  CBC: Recent Labs  Lab 07/04/17 1226 07/04/17 1825 07/05/17 0128 07/05/17 1436 07/05/17 1700 07/06/17 0148  WBC 8.0 7.2 5.7 2.0*  --  4.7  HGB 12.4* 11.7* 11.2* 11.5*  --  11.2*  HCT 35.1* 33.9* 32.3* 33.4*  --  33.4*  MCV 86.5 87.8 87.5 89.1  --  89.5  PLT 215 192 191 SPECIMEN CLOTTED 223 147    Basic Metabolic Panel: Recent Labs  Lab 07/01/17 2125 07/04/17 1226 07/05/17 0128 07/05/17 1436 07/06/17 0148  NA 132* 135 138  --  139  K 4.3 3.9 3.4*  --  3.7  CL 90* 100* 104  --  103  CO2 23 27 28   --  28  GLUCOSE 307* 115* 200*  --  202*  BUN 7 7 5*  --  <5*  CREATININE 1.11 0.84 0.77  --  0.62  CALCIUM 8.6* 8.6* 8.5*  --  8.8*  MG  --   --   --  1.6* 1.9    GFR: Estimated Creatinine Clearance: 146.5 mL/min (by C-G formula based on SCr of 0.62 mg/dL).  Liver Function Tests: Recent Labs  Lab 07/01/17 2125 07/04/17 1226 07/05/17 0128 07/06/17 0148  AST 261* 220* 114* 92*  ALT 167* 178* 129* 127*  ALKPHOS 146* 124 96 99  BILITOT 1.7* 0.6 0.5 0.5  PROT 7.6 6.9 5.9* 6.2*  ALBUMIN 3.9 3.7 3.0* 3.2*    Recent Labs  Lab 07/01/17 2125 07/04/17 1226  LIPASE 19 17  Recent Labs  Lab 07/01/17 2233  AMMONIA 41*    Coagulation Profile: Recent Labs  Lab 07/04/17 1226  INR 0.96    CBG: Recent Labs  Lab 07/05/17 0730 07/05/17 1134 07/05/17 1633 07/05/17 2027 07/06/17 0735  GLUCAP 96 416* 121* 215* 116*     Radiology Studies: Ct Abdomen Pelvis W Contrast  Result Date: 07/04/2017 CLINICAL DATA:  Bright red rectal bleeding since this morning, history diabetes mellitus, hypertension, folic pancreatitis EXAM: CT ABDOMEN AND PELVIS WITH CONTRAST TECHNIQUE: Multidetector CT imaging of the abdomen and pelvis was performed using the standard protocol following bolus administration of intravenous contrast. Sagittal and coronal MPR images reconstructed from axial data set. CONTRAST:  155mL ISOVUE-300 IOPAMIDOL (ISOVUE-300) INJECTION 61% IV. No oral  contrast. COMPARISON:  03/26/2017 FINDINGS: Lower chest: Lung bases clear Hepatobiliary: Fatty infiltration of liver. Gallbladder and liver otherwise normal appearance Pancreas: Atrophic with mild dilatation of the pancreatic duct up to 6 mm diameter. No definite pancreatic mass. Few calcifications at pancreatic head/uncinate. Spleen: Normal appearance.  Small splenule. Adrenals/Urinary Tract: Adrenal glands normal appearance. Kidneys, ureters, and bladder normal appearance. Stomach/Bowel: Normal appendix. Wall thickening of a segment of the mid transverse colon question colitis versus mass. Minimal distal colonic diverticulosis without evidence of diverticulitis. Stomach and bowel loops otherwise unremarkable for technique. Vascular/Lymphatic: Vascular structures unremarkable. No adenopathy. Reproductive: Unremarkable Other: No free air or free fluid. LEFT small LEFT inguinal hernia containing fat. Small umbilical hernia containing fat. Musculoskeletal: Normal appearance IMPRESSION: Segmental wall thickening of the mid transverse colon which could represent segmental colitis or mass; correlation with colonoscopy recommended. Minimal distal colonic diverticulosis without evidence of diverticulitis. Fatty infiltration of liver. 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 LEFT inguinal and umbilical hernias containing fat. Electronically Signed   By: Lavonia Dana M.D.   On: 07/04/2017 13:17     Medications:  Scheduled: . amLODipine  10 mg Oral Daily  . cloNIDine  0.1 mg Oral TID  . escitalopram  20 mg Oral Daily  . folic acid  1 mg Oral Daily  . hydrocortisone  25 mg Rectal BID  . insulin aspart  0-15 Units Subcutaneous TID WC  . insulin glargine  10 Units Subcutaneous QHS  . lisinopril  20 mg Oral Daily  . LORazepam  0-4 mg Oral Q6H   Followed by  . LORazepam  0-4 mg Oral Q12H  . multivitamin with minerals  1 tablet Oral Daily  . pantoprazole  40 mg Oral Q0600  .  QUEtiapine  300 mg Oral QHS  . thiamine  100 mg Oral Daily   Or  . thiamine  100 mg Intravenous Daily   Continuous: . sodium chloride 75 mL/hr at 07/05/17 0700   XLK:[id number], LORazepam **OR** LORazepam, morphine injection, ondansetron **OR** ondansetron (ZOFRAN) IV, oxyCODONE  Assessment/Plan:  Principal Problem:   Lower GI bleed Active Problems:   Diabetes type 2, uncontrolled (HCC)   Essential hypertension   Chronic pancreatitis ([place])   Alcohol abuse   Anemia, blood loss   Transaminitis    Hematochezia Patient continues to have small amounts of bleeding per rectum.  Hemoglobin however has been stable.  Gastroenterology is following.  Colonoscopy is planned for today.  Bleeding thought to be either due to hemorrhoids or ischemic colitis.    Findings of colitis on CT scan Involves an area which raises concern for this being ischemic colitis.  There is also concern for a mass in the transverse colon.  Colonoscopy today.  Continues  to have abdominal pain.  Continue analgesic agents.  Holding off on antibiotics.     Acute blood loss anemia Drop in hemoglobin noted but not severe.  He does not need blood transfusion.  Continue to monitor closely.  INR was normal.  Hypokalemia and hypomagnesemia These have been repleted.  Alcohol abuse History of heavy consumption of beer.  Last consumption was day prior to admission.  Continue with CIWA protocol.  Thiamine and folic acid.  Alcohol cessation counseling.  No signs or symptoms of withdrawal noted.  Transaminitis Probably due to alcohol use/hepatitis.  LFTs continue to improve. Fatty infiltration of liver noted on CT scan.  Gallbladder was normal in appearance.  Holding statin.    Diabetes mellitus type 2, uncontrolled Last HbA1c 8.4 in February.  Patient placed on Lantus as well as sliding scale coverage.  CBGs have improved with change to his diet.  Continue low-dose Lantus for now.  Will increase as he is placed back on  diet.  Essential hypertension Blood pressure remains well controlled.  Continue home medications.  He is on clonidine, amlodipine and lisinopril.  Hydralazine as needed.  Chronic pancreatitis Pancreatic changes noted on CT scan.  This is most likely due to alcohol use.  Lipase has been normal.  Incidental finding of umbilical and inguinal hernias Does not appear to be the reason for his current symptoms.  History of depression Continue with home medications.  DVT Prophylaxis: SCDs    Code Status: Full code Family Communication: Discussed with the patient Disposition Plan: Colonoscopy today.    LOS: 2 days   [facility] Pager [phone] 07/06/2017, 9:59 AM  If 7PM-7AM, please contact night-coverage at [email or web address], password [facility]

## 2017-07-06 NOTE — Anesthesia Postprocedure Evaluation (Signed)
Anesthesia Post Note  Patient: Willie [name]  Procedure(s) Performed: COLONOSCOPY WITH PROPOFOL (N/A )     Patient location during evaluation: PACU Anesthesia Type: MAC Level of consciousness: awake and alert Pain management: pain level controlled Vital Signs Assessment: post-procedure vital signs reviewed and stable Respiratory status: spontaneous breathing, nonlabored ventilation and respiratory function stable Cardiovascular status: stable and blood pressure returned to baseline Anesthetic complications: no    Last Vitals:  Vitals:   07/06/17 1249 07/06/17 1333  BP: (!) 139/97 104/69  Pulse: 79 83  Resp: 12 16  Temp: 36.9 C 36.6 C  SpO2: 100% 100%    Last Pain:  Vitals:   07/06/17 1333  TempSrc: Oral  PainSc: 7                  Audry Pili

## 2017-07-06 NOTE — Progress Notes (Signed)
Daily Rounding Note  07/06/2017, 8:19 AM  LOS: 2 days   SUBJECTIVE:   Drank all of prep.  Stool at this point murky, watery with some blood, mostly with wiping    OBJECTIVE:         Vital signs in last 24 hours:    Temp:  [97.6 F (36.4 C)-98.3 F (36.8 C)] 98.1 F (36.7 C) (03/10 0443) Pulse Rate:  [88-96] 91 (03/10 0443) Resp:  [16-18] 16 (03/10 0443) BP: (106-153)/(82-93) 121/93 (03/10 0443) SpO2:  [99 %-100 %] 99 % (03/10 0443) Last BM Date: 07/05/17 Filed Weights   07/04/17 1211 07/04/17 1702  Weight: 210 lb (95.3 kg) 208 lb 5.4 oz (94.5 kg)   General: looks well.  Anxious.  Comfortable.  Not ill looking Did not physically reexamine.      Intake/Output from previous day: 03/09 0701 - 03/10 0700 In: 3715 [P.O.:2040; I.V.:1625; IV Piggyback:50] Out: -   Intake/Output this shift: No intake/output data recorded.  Lab Results: Recent Labs    07/05/17 0128 07/05/17 1436 07/05/17 1700 07/06/17 0148  WBC 5.7 2.0*  --  4.7  HGB 11.2* 11.5*  --  11.2*  HCT 32.3* 33.4*  --  33.4*  PLT 191 SPECIMEN CLOTTED 223 207   BMET Recent Labs    07/04/17 1226 07/05/17 0128 07/06/17 0148  NA 135 138 139  K 3.9 3.4* 3.7  CL 100* 104 103  CO2 27 28 28   GLUCOSE 115* 200* 202*  BUN 7 5* <5*  CREATININE 0.84 0.77 0.62  CALCIUM 8.6* 8.5* 8.8*   LFT Recent Labs    07/04/17 1226 07/05/17 0128 07/06/17 0148  PROT 6.9 5.9* 6.2*  ALBUMIN 3.7 3.0* 3.2*  AST 220* 114* 92*  ALT 178* 129* 127*  ALKPHOS 124 96 99  BILITOT 0.6 0.5 0.5   PT/INR Recent Labs    07/04/17 1226  LABPROT 12.7  INR 0.96   Hepatitis Panel No results for input(s): HEPBSAG, HCVAB, HEPAIGM, HEPBIGM in the last 72 hours.  Studies/Results: Ct Abdomen Pelvis W Contrast  Result Date: 07/04/2017 CLINICAL DATA:  Bright red rectal bleeding since this morning, history diabetes mellitus, hypertension, folic pancreatitis EXAM: CT ABDOMEN AND  PELVIS WITH CONTRAST TECHNIQUE: Multidetector CT imaging of the abdomen and pelvis was performed using the standard protocol following bolus administration of intravenous contrast. Sagittal and coronal MPR images reconstructed from axial data set. CONTRAST:  157mL ISOVUE-300 IOPAMIDOL (ISOVUE-300) INJECTION 61% IV. No oral contrast. COMPARISON:  03/26/2017 FINDINGS: Lower chest: Lung bases clear Hepatobiliary: Fatty infiltration of liver. Gallbladder and liver otherwise normal appearance Pancreas: Atrophic with mild dilatation of the pancreatic duct up to 6 mm diameter. No definite pancreatic mass. Few calcifications at pancreatic head/uncinate. Spleen: Normal appearance.  Small splenule. Adrenals/Urinary Tract: Adrenal glands normal appearance. Kidneys, ureters, and bladder normal appearance. Stomach/Bowel: Normal appendix. Wall thickening of a segment of the mid transverse colon question colitis versus mass. Minimal distal colonic diverticulosis without evidence of diverticulitis. Stomach and bowel loops otherwise unremarkable for technique. Vascular/Lymphatic: Vascular structures unremarkable. No adenopathy. Reproductive: Unremarkable Other: No free air or free fluid. LEFT small LEFT inguinal hernia containing fat. Small umbilical hernia containing fat. Musculoskeletal: Normal appearance IMPRESSION: Segmental wall thickening of the mid transverse colon which could represent segmental colitis or mass; correlation with colonoscopy recommended. Minimal distal colonic diverticulosis without evidence of diverticulitis. Fatty infiltration of liver. 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 LEFT inguinal and  umbilical hernias containing fat. Electronically Signed   By: Lavonia Dana M.D.   On: 07/04/2017 13:17    ASSESMENT:   *  Bloody stool, colitis.  Bleeding hemorrhoids.     *  Blood loss anemia.  After initial drop, Hgb stable.      PLAN   *  Colonoscopy this AM.       Willie [name]  07/06/2017, 8:19 AM Pager: [phone]

## 2017-07-07 ENCOUNTER — Encounter (HOSPITAL_COMMUNITY): Payer: Self-pay | Admitting: Internal Medicine

## 2017-07-07 ENCOUNTER — Inpatient Hospital Stay (HOSPITAL_COMMUNITY): Payer: No Typology Code available for payment source

## 2017-07-07 LAB — GLUCOSE, CAPILLARY
GLUCOSE-CAPILLARY: 207 mg/dL — AB (ref 65–99)
GLUCOSE-CAPILLARY: 115 mg/dL — AB (ref 65–99)

## 2017-07-07 LAB — COMPREHENSIVE METABOLIC PANEL
ALBUMIN: 3.2 g/dL — AB (ref 3.5–5.0)
ALK PHOS: 104 U/L (ref 38–126)
ALT: 110 U/L — AB (ref 17–63)
ANION GAP: 7 (ref 5–15)
AST: 74 U/L — ABNORMAL HIGH (ref 15–41)
BUN: 5 mg/dL — ABNORMAL LOW (ref 6–20)
CALCIUM: 9.1 mg/dL (ref 8.9–10.3)
CHLORIDE: 103 mmol/L (ref 101–111)
CO2: 28 mmol/L (ref 22–32)
CREATININE: 0.79 mg/dL (ref 0.61–1.24)
GFR calc Af Amer: >60 mL/min (ref >60)
GFR calc non Af Amer: >60 mL/min (ref >60)
GLUCOSE: 279 mg/dL — AB (ref 65–99)
Potassium: 3.8 mmol/L (ref 3.5–5.1)
SODIUM: 138 mmol/L (ref 135–145)
Total Bilirubin: 0.5 mg/dL (ref 0.3–1.2)
Total Protein: 6.3 g/dL — ABNORMAL LOW (ref 6.5–8.1)

## 2017-07-07 LAB — HEPATITIS PANEL, ACUTE
HCV Ab: <0.1 s/co ratio (ref 0.0–0.9)
HEP B S AG: NEGATIVE
Hep A IgM: NEGATIVE
Hep B C IgM: NEGATIVE

## 2017-07-07 LAB — CBC
HEMATOCRIT: 35.3 % — AB (ref 39.0–52.0)
Hemoglobin: 12 g/dL — ABNORMAL LOW (ref 13.0–17.0)
MCH: 30.5 pg (ref 26.0–34.0)
MCHC: 34 g/dL (ref 30.0–36.0)
MCV: 89.8 fL (ref 78.0–100.0)
PLATELETS: 217 10*3/uL (ref 150–400)
RBC: 3.93 MIL/uL — AB (ref 4.22–5.81)
RDW: 13.9 % (ref 11.5–15.5)
WBC: 4.7 10*3/uL (ref 4.0–10.5)

## 2017-07-07 LAB — MAGNESIUM: Magnesium: 1.9 mg/dL (ref 1.7–2.4)

## 2017-07-07 MED ORDER — HYDROCORTISONE ACETATE 25 MG RE SUPP: 25.0000 mg | Freq: Two times a day (BID) | RECTAL | 0 refills | Status: DC

## 2017-07-07 MED ORDER — LORAZEPAM 0.5 MG PO TABS: 0.5000 mg | ORAL_TABLET | Freq: Three times a day (TID) | ORAL | 0 refills | Status: DC | PRN

## 2017-07-07 MED ORDER — OXYCODONE HCL 5 MG PO TABS: 5.0000 mg | ORAL_TABLET | Freq: Four times a day (QID) | ORAL | 0 refills | Status: DC | PRN

## 2017-07-07 MED FILL — HEMMOREX-HC 25 MG SUPP: 25 | 30 days supply | Qty: 60 | Fill #0

## 2017-07-07 MED FILL — oxyCODONE HCL 5 MG TABS: 5 | 3 days supply | Qty: 15 | Fill #0

## 2017-07-07 MED FILL — LORazepam 0.5 MG TABS: 0.5 | 3 days supply | Qty: 10 | Fill #0

## 2017-07-07 NOTE — Discharge Summary (Signed)
[facility]  Physician Discharge Summary   Patient ID: Willie [name] MRN: [id number] DOB/AGE: Nov 03, 1975 42 y.o.  Admit date: 07/04/2017 Discharge date: 07/07/2017  PCP: Debbrah Alar, NP  DISCHARGE DIAGNOSES:  Principal Problem:   Lower GI bleed Active Problems:   Diabetes type 2, uncontrolled ([place])   Essential hypertension   Chronic pancreatitis ([facility])   Alcohol abuse   Anemia, blood loss   Transaminitis   Abnormal CT scan, colon   Benign neoplasm of descending colon   RECOMMENDATIONS FOR OUTPATIENT FOLLOW UP: 1. Patient to follow-up with gastroenterology for further management of his hemorrhoids   DISCHARGE CONDITION: fair  Diet recommendation: Modified carbohydrate  Filed Weights   07/04/17 1211 07/04/17 1702 07/06/17 1249  Weight: 95.3 kg (210 lb) 94.5 kg (208 lb 5.4 oz) 94.3 kg (208 lb)    INITIAL HISTORY: 42 year old male with a past medical history of essential hypertension, diabetes, alcoholism, chronic pancreatitis presented with rectal bleeding.  Concern was for hemorrhoidal versus ischemic colitis.  Patient was hospitalized for further management.  Consultants: Gastroenterology  Procedures:   Colonoscopy Impression:               - One 4 mm polyp in the descending colon, removed                            with a cold snare. Resected and retrieved.                           - Moderate diverticulosis in the descending colon,                            in the transverse colon and in the ascending colon.                           - No evidence of colitis.                           - Internal hemorrhoids.                           - Recent hematochezia with resultant anemia most                            likely resolved diverticular bleeding (hemorrhoidal                            bleeding alone not felt to explain more recent                            hematochezia and anemia)    HOSPITAL COURSE:   Hematochezia Patient's  hematochezia appears to have resolved.  He has not had any bleeding since his colonoscopy yesterday.  Hemoglobin remains stable.  The patient underwent polypectomy.  He was found to have moderate diverticulosis and internal hemorrhoids.  There was no evidence for colitis.  Patient to follow-up with gastroenterology next month.    Findings of colitis on CT scan Involves an area which raises concern for this being ischemic colitis.  There is also concern for a mass in the transverse colon.  Patient underwent colonoscopy which did not reveal any mass or colitis.  Reason for his abdominal pain is unclear.  However patient is tolerating his diet well.  Abdominal films were done today did not show any free air.  Okay for discharge home today.     Acute blood loss anemia Drop in hemoglobin noted but not severe.    Did not require blood transfusion.  Hypokalemia and hypomagnesemia These have been repleted.  Alcohol abuse History of heavy consumption of beer.  Last consumption was day prior to admission.  He was placed on CIWA protocol.  Did not have any significant signs or symptoms of withdrawal.  Alcohol cessation was strongly emphasized.  Thiamine multivitamins.  Transaminitis Secondary to alcohol use/hepatitis.  LFTs continue to improve. Fatty infiltration of liver noted on CT scan.  Gallbladder was normal in appearance.  Holding statin.    Diabetes mellitus type 2, uncontrolled Last HbA1c 8.4 in February.    Continue with his home medication regimen.  Essential hypertension Blood pressure remains well controlled.  Continue home medications.  He is on clonidine, amlodipine and lisinopril.   Chronic pancreatitis Pancreatic changes noted on CT scan.  This is most likely due to alcohol use.  Lipase has been normal.  Patient asked to stop alcohol consumption.  Incidental finding of umbilical and inguinal hernias Does not appear to be the reason for his current symptoms.  History of  depression Continue with home medications.  Overall stable.  Okay for discharge home today.    PERTINENT LABS:  The results of significant diagnostics from this hospitalization (including imaging, microbiology, ancillary and laboratory) are listed below for reference.    Microbiology: No results found for this or any previous visit (from the past 240 hour(s)).   Labs: Basic Metabolic Panel: Recent Labs  Lab 07/01/17 2125 07/04/17 1226 07/05/17 0128 07/05/17 1436 07/06/17 0148 07/07/17 0549  NA 132* 135 138  --  139 138  K 4.3 3.9 3.4*  --  3.7 3.8  CL 90* 100* 104  --  103 103  CO2 '23 27 28  ' --  28 28  GLUCOSE 307* 115* 200*  --  202* 279*  BUN 7 7 5*  --  <5* 5*  CREATININE 1.11 0.84 0.77  --  0.62 0.79  CALCIUM 8.6* 8.6* 8.5*  --  8.8* 9.1  MG  --   --   --  1.6* 1.9 1.9   Liver Function Tests: Recent Labs  Lab 07/01/17 2125 07/04/17 1226 07/05/17 0128 07/06/17 0148 07/07/17 0549  AST 261* 220* 114* 92* 74*  ALT 167* 178* 129* 127* 110*  ALKPHOS 146* 124 96 99 104  BILITOT 1.7* 0.6 0.5 0.5 0.5  PROT 7.6 6.9 5.9* 6.2* 6.3*  ALBUMIN 3.9 3.7 3.0* 3.2* 3.2*   Recent Labs  Lab 07/01/17 2125 07/04/17 1226  LIPASE 19 17   Recent Labs  Lab 07/01/17 2233  AMMONIA 41*   CBC: Recent Labs  Lab 07/04/17 1825 07/05/17 0128 07/05/17 1436 07/05/17 1700 07/06/17 0148 07/07/17 0549  WBC 7.2 5.7 2.0*  --  4.7 4.7  HGB 11.7* 11.2* 11.5*  --  11.2* 12.0*  HCT 33.9* 32.3* 33.4*  --  33.4* 35.3*  MCV 87.8 87.5 89.1  --  89.5 89.8  PLT 192 191 SPECIMEN CLOTTED 223 207 217    CBG: Recent Labs  Lab 07/06/17 1137 07/06/17 1732 07/06/17 2007 07/07/17 0728 07/07/17 1152  GLUCAP 102* 401* 142* 207* 115*  IMAGING STUDIES Dg Chest 2 View  Result Date: 06/07/2017 CLINICAL DATA:  LEFT arm weakness, dry heaves, type II diabetes mellitus, GERD, hypertension EXAM: CHEST  2 VIEW COMPARISON:  03/26/2017 FINDINGS: Upper normal heart size. Mediastinal contours  and pulmonary vascularity normal. Lungs clear. No pleural effusion or pneumothorax. Bones unremarkable. IMPRESSION: No acute abnormalities. Electronically Signed   By: Lavonia Dana M.D.   On: 06/07/2017 20:05   Mr Brain Wo Contrast  Result Date: 06/07/2017 CLINICAL DATA:  42 y/o  M; left arm weakness and numbness. EXAM: MRI HEAD WITHOUT CONTRAST MRA HEAD WITHOUT CONTRAST TECHNIQUE: Multiplanar, multiecho pulse sequences of the brain and surrounding structures were obtained without intravenous contrast. Angiographic images of the head were obtained using MRA technique without contrast. COMPARISON:  06/07/2017 CT head and CTA head. FINDINGS: MRI HEAD FINDINGS Brain: No acute infarction, hemorrhage, hydrocephalus, extra-axial collection or mass lesion. No structural or significant signal abnormality of the brain. Vascular: As below. Skull and upper cervical spine: Normal marrow signal. Sinuses/Orbits: Mild sphenoid and left maxillary sinus mucosal thickening. No abnormal signal of mastoid air cells. Orbits are unremarkable. Other: None. MRA HEAD FINDINGS Internal carotid arteries:  Patent. Anterior cerebral arteries:  Patent. Middle cerebral arteries: Patent. Anterior communicating artery: Patent. Posterior communicating arteries:  Patent. Posterior cerebral arteries:  Patent. Basilar artery:  Patent. Vertebral arteries:  Patent. No evidence of high-grade stenosis, large vessel occlusion, or aneurysm. IMPRESSION: 1. Normal MRI of the brain. 2. Normal MRA of the head. Electronically Signed   By: Kristine Garbe M.D.   On: 06/07/2017 23:30   Mr Cervical Spine Wo Contrast  Result Date: 06/09/2017 CLINICAL DATA:  Left upper extremity weakness.  Neck pain. EXAM: MRI CERVICAL SPINE WITHOUT CONTRAST TECHNIQUE: Multiplanar, multisequence MR imaging of the cervical spine was performed. No intravenous contrast was administered. COMPARISON:  None. FINDINGS: Alignment: Physiologic. Vertebrae: No fracture, evidence  of discitis, or bone lesion. Cord: Normal signal and morphology. Posterior Fossa, vertebral arteries, paraspinal tissues: Disc levels: The disc spaces above C6 are normal. C6-C7: Small left subarticular/foraminal disc protrusion without foraminal stenosis. The C7-T1 and T1-T2 levels are normal. IMPRESSION: Small C6-7 left subarticular disc protrusion without foraminal stenosis. Electronically Signed   By: Ulyses Jarred M.D.   On: 06/09/2017 18:55   Ct Abdomen Pelvis W Contrast  Result Date: 07/04/2017 CLINICAL DATA:  Bright red rectal bleeding since this morning, history diabetes mellitus, hypertension, folic pancreatitis EXAM: CT ABDOMEN AND PELVIS WITH CONTRAST TECHNIQUE: Multidetector CT imaging of the abdomen and pelvis was performed using the standard protocol following bolus administration of intravenous contrast. Sagittal and coronal MPR images reconstructed from axial data set. CONTRAST:  159m ISOVUE-300 IOPAMIDOL (ISOVUE-300) INJECTION 61% IV. No oral contrast. COMPARISON:  03/26/2017 FINDINGS: Lower chest: Lung bases clear Hepatobiliary: Fatty infiltration of liver. Gallbladder and liver otherwise normal appearance Pancreas: Atrophic with mild dilatation of the pancreatic duct up to 6 mm diameter. No definite pancreatic mass. Few calcifications at pancreatic head/uncinate. Spleen: Normal appearance.  Small splenule. Adrenals/Urinary Tract: Adrenal glands normal appearance. Kidneys, ureters, and bladder normal appearance. Stomach/Bowel: Normal appendix. Wall thickening of a segment of the mid transverse colon question colitis versus mass. Minimal distal colonic diverticulosis without evidence of diverticulitis. Stomach and bowel loops otherwise unremarkable for technique. Vascular/Lymphatic: Vascular structures unremarkable. No adenopathy. Reproductive: Unremarkable Other: No free air or free fluid. LEFT small LEFT inguinal hernia containing fat. Small umbilical hernia containing fat. Musculoskeletal:  Normal appearance IMPRESSION: Segmental wall thickening of the mid transverse colon  which could represent segmental colitis or mass; correlation with colonoscopy recommended. Minimal distal colonic diverticulosis without evidence of diverticulitis. Fatty infiltration of liver. 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 LEFT inguinal and umbilical hernias containing fat. Electronically Signed   By: Lavonia Dana M.D.   On: 07/04/2017 13:17   Dg Shoulder Left  Result Date: 06/08/2017 CLINICAL DATA:  Left arm weakness and numbness beginning at 6 a.m. yesterday. Decreased range of motion. EXAM: LEFT SHOULDER - 2+ VIEW COMPARISON:  Left shoulder radiographs 09/13/2015 FINDINGS: Degenerative changes of the left AC joint are stable. The clavicle is intact. The left shoulder is located. No acute bone or soft tissue abnormalities are present. The left hemithorax is clear. IMPRESSION: 1. No acute cardiopulmonary disease. 2. Stable degenerative changes at the left [facility] joint. Electronically Signed   By: San Morelle M.D.   On: 06/08/2017 16:17   Dg Abd 2 Views  Result Date: 07/07/2017 CLINICAL DATA:  Mid abdominal pain. History of colonoscopy and polypectomy on 07/06/2017. EXAM: ABDOMEN - 2 VIEW COMPARISON:  07/04/2017. FINDINGS: On the upright projection of the abdomen, no free intraperitoneal gas is observed. There is formed stool in the colon. No appreciable dilated bowel or significant abnormal calcifications. IMPRESSION: 1. Formed stool is present in the colon. No dilated bowel or free intraperitoneal gas identified. If atypical pain persists despite conservative therapy, CT scan may be helpful. Electronically Signed   By: Van Clines M.D.   On: 07/07/2017 10:35   Mr Jodene Nam Head Wo Contrast  Result Date: 06/07/2017 CLINICAL DATA:  42 y/o  M; left arm weakness and numbness. EXAM: MRI HEAD WITHOUT CONTRAST MRA HEAD WITHOUT CONTRAST TECHNIQUE: Multiplanar, multiecho pulse  sequences of the brain and surrounding structures were obtained without intravenous contrast. Angiographic images of the head were obtained using MRA technique without contrast. COMPARISON:  06/07/2017 CT head and CTA head. FINDINGS: MRI HEAD FINDINGS Brain: No acute infarction, hemorrhage, hydrocephalus, extra-axial collection or mass lesion. No structural or significant signal abnormality of the brain. Vascular: As below. Skull and upper cervical spine: Normal marrow signal. Sinuses/Orbits: Mild sphenoid and left maxillary sinus mucosal thickening. No abnormal signal of mastoid air cells. Orbits are unremarkable. Other: None. MRA HEAD FINDINGS Internal carotid arteries:  Patent. Anterior cerebral arteries:  Patent. Middle cerebral arteries: Patent. Anterior communicating artery: Patent. Posterior communicating arteries:  Patent. Posterior cerebral arteries:  Patent. Basilar artery:  Patent. Vertebral arteries:  Patent. No evidence of high-grade stenosis, large vessel occlusion, or aneurysm. IMPRESSION: 1. Normal MRI of the brain. 2. Normal MRA of the head. Electronically Signed   By: Kristine Garbe M.D.   On: 06/07/2017 23:30    DISCHARGE EXAMINATION: Vitals:   07/06/17 1734 07/06/17 2009 07/07/17 0007 07/07/17 0427  BP: (!) 152/98 (!) 131/98 101/69 125/77  Pulse: 74 91 88 71  Resp: '18 17 18 18  ' Temp: 98 F (36.7 C) 98.7 F (37.1 C) 98.3 F (36.8 C) 98.2 F (36.8 C)  TempSrc: Oral Oral Oral Oral  SpO2: 100% 100% 99% 100%  Weight:      Height:       General appearance: alert, cooperative, appears stated age and no distress Resp: clear to auscultation bilaterally Cardio: regular rate and rhythm, S1, S2 normal, no murmur, click, rub or gallop GI: Soft.  Mildly tender without any rebound rigidity or guarding.  No masses or organomegaly.  Bowel sounds present.  DISPOSITION: Home  Discharge Instructions    Call MD for:  extreme  fatigue   Complete by:  As directed    Call MD for:   persistant dizziness or light-headedness   Complete by:  As directed    Call MD for:  persistant nausea and vomiting   Complete by:  As directed    Call MD for:  severe uncontrolled pain   Complete by:  As directed    Call MD for:  temperature >100.4   Complete by:  As directed    Discharge instructions   Complete by:  As directed    Please stay off of alcohol.  Take your medications as prescribed.  No driving or operating machinery while on pain or anxiety medications.  You were cared for by a hospitalist during your hospital stay. If you have any questions about your discharge medications or the care you received while you were in the hospital after you are discharged, you can call the unit and asked to speak with the hospitalist on call if the hospitalist that took care of you is not available. Once you are discharged, your primary care physician will handle any further medical issues. Please note that NO REFILLS for any discharge medications will be authorized once you are discharged, as it is imperative that you return to your primary care physician (or establish a relationship with a primary care physician if you do not have one) for your aftercare needs so that they can reassess your need for medications and monitor your lab values. If you do not have a primary care physician, you can call [phone] for a physician referral.   Increase activity slowly   Complete by:  As directed         Allergies as of 07/07/2017      Reactions   Invokamet [canagliflozin-metformin Hcl] Other (See Comments)   Lactic acidosis   Metformin And Related Other (See Comments)   DRASTIC drop in blood sugar      Medication List    STOP taking these medications   atorvastatin 20 MG tablet Commonly known as:  LIPITOR   hydrOXYzine 50 MG tablet Commonly known as:  ATARAX/VISTARIL     TAKE these medications   amLODipine 10 MG tablet Commonly known as:  NORVASC Take 1 tablet (10 mg total) by mouth  daily.   cloNIDine 0.1 MG tablet Commonly known as:  CATAPRES Take 1 tablet (0.1 mg total) by mouth 3 (three) times daily.   escitalopram 20 MG tablet Commonly known as:  LEXAPRO Take 1 tablet (20 mg total) by mouth daily.   FREESTYLE FREEDOM LITE w/Device Kit 2 (two) times daily   freestyle lancets 2 (two) times daily   glucose blood test strip Commonly known as:  FREESTYLE LITE Use as instructed   hydrocortisone 25 MG suppository Commonly known as:  ANUSOL-HC Place 1 suppository (25 mg total) rectally 2 (two) times daily.   insulin lispro 100 UNIT/ML KiwkPen Commonly known as:  HUMALOG KWIKPEN 3 times a day (just before each meal) 09-10-08 units, What changed:    how much to take  how to take this  when to take this  additional instructions   Insulin Pen Needle 31G X 8 MM Misc Commonly known as:  ULTICARE SHORT PEN NEEDLES Use once daily to inject insulin.   LANTUS SOLOSTAR 100 UNIT/ML Solostar Pen Generic drug:  Insulin Glargine INJECT 40 UNITS INTO THE SKIN DAILY AT 10 PM. What changed:  See the new instructions.   lisinopril 20 MG tablet Commonly known as:  PRINIVIL,ZESTRIL TAKE  1 TABLET (20 MG TOTAL) BY MOUTH DAILY. What changed:    how much to take  how to take this  when to take this  additional instructions   LORazepam 0.5 MG tablet Commonly known as:  ATIVAN Take 1 tablet (0.5 mg total) by mouth every 8 (eight) hours as needed for anxiety.   multivitamin tablet Take 1 tablet by mouth daily.   ondansetron 4 MG disintegrating tablet Commonly known as:  ZOFRAN ODT Take 1 tablet (4 mg total) by mouth every 6 (six) hours as needed for nausea or vomiting.   oxyCODONE 5 MG immediate release tablet Commonly known as:  Oxy IR/ROXICODONE Take 1 tablet (5 mg total) by mouth every 6 (six) hours as needed for severe pain.   pantoprazole 40 MG tablet Commonly known as:  PROTONIX Take 1 tablet (40 mg total) by mouth 2 (two) times daily.     potassium chloride 10 MEQ tablet Commonly known as:  K-DUR,KLOR-CON Take 2 tablets (20 mEq total) by mouth daily.   QUEtiapine 300 MG tablet Commonly known as:  SEROQUEL Take 1 tablet (300 mg total) by mouth at bedtime.   thiamine 100 MG tablet Take 1 tablet (100 mg total) by mouth daily.        Follow-up Information    Armbruster, Carlota Raspberry, MD Follow up.   Specialty:  Gastroenterology Why:  keep your appointments on 4/23 and 5/7 Contact information: [address] [place] [address] [phone]           TOTAL DISCHARGE TIME: 35 minutes  Bonnielee Haff  [facility] Pager [phone]  07/07/2017, 1:40 PM

## 2017-07-07 NOTE — Discharge Instructions (Signed)

## 2017-07-28 ENCOUNTER — Emergency Department (HOSPITAL_COMMUNITY): Payer: No Typology Code available for payment source

## 2017-07-28 ENCOUNTER — Encounter (HOSPITAL_COMMUNITY): Payer: Self-pay | Admitting: Emergency Medicine

## 2017-07-28 ENCOUNTER — Inpatient Hospital Stay (HOSPITAL_COMMUNITY)
Admission: EM | Admit: 2017-07-28 | Discharge: 2017-07-30 | DRG: 638 | Disposition: A | Discharge status: Home / Self Care | Payer: No Typology Code available for payment source | Attending: Internal Medicine | Admitting: Internal Medicine

## 2017-07-28 DIAGNOSIS — Z833 Family history of diabetes mellitus: Secondary | ICD-10-CM | POA: Diagnosis not present

## 2017-07-28 DIAGNOSIS — F1010 Alcohol abuse, uncomplicated: Secondary | ICD-10-CM | POA: Diagnosis present

## 2017-07-28 DIAGNOSIS — Z87891 Personal history of nicotine dependence: Secondary | ICD-10-CM | POA: Diagnosis not present

## 2017-07-28 DIAGNOSIS — F1094 Alcohol use, unspecified with alcohol-induced mood disorder: Secondary | ICD-10-CM

## 2017-07-28 DIAGNOSIS — I10 Essential (primary) hypertension: Secondary | ICD-10-CM | POA: Diagnosis not present

## 2017-07-28 DIAGNOSIS — K861 Other chronic pancreatitis: Secondary | ICD-10-CM | POA: Diagnosis present

## 2017-07-28 DIAGNOSIS — F10229 Alcohol dependence with intoxication, unspecified: Secondary | ICD-10-CM | POA: Diagnosis present

## 2017-07-28 DIAGNOSIS — F418 Other specified anxiety disorders: Secondary | ICD-10-CM | POA: Diagnosis present

## 2017-07-28 DIAGNOSIS — E1110 Type 2 diabetes mellitus with ketoacidosis without coma: Secondary | ICD-10-CM | POA: Diagnosis not present

## 2017-07-28 DIAGNOSIS — E785 Hyperlipidemia, unspecified: Secondary | ICD-10-CM | POA: Diagnosis present

## 2017-07-28 DIAGNOSIS — L309 Dermatitis, unspecified: Secondary | ICD-10-CM | POA: Diagnosis present

## 2017-07-28 DIAGNOSIS — K219 Gastro-esophageal reflux disease without esophagitis: Secondary | ICD-10-CM | POA: Diagnosis present

## 2017-07-28 DIAGNOSIS — G4700 Insomnia, unspecified: Secondary | ICD-10-CM | POA: Diagnosis not present

## 2017-07-28 DIAGNOSIS — F10230 Alcohol dependence with withdrawal, uncomplicated: Secondary | ICD-10-CM | POA: Diagnosis not present

## 2017-07-28 DIAGNOSIS — D638 Anemia in other chronic diseases classified elsewhere: Secondary | ICD-10-CM | POA: Diagnosis present

## 2017-07-28 DIAGNOSIS — F1024 Alcohol dependence with alcohol-induced mood disorder: Secondary | ICD-10-CM | POA: Diagnosis present

## 2017-07-28 DIAGNOSIS — Z794 Long term (current) use of insulin: Secondary | ICD-10-CM

## 2017-07-28 DIAGNOSIS — E876 Hypokalemia: Secondary | ICD-10-CM | POA: Diagnosis present

## 2017-07-28 DIAGNOSIS — G4730 Sleep apnea, unspecified: Secondary | ICD-10-CM | POA: Diagnosis present

## 2017-07-28 DIAGNOSIS — E1310 Other specified diabetes mellitus with ketoacidosis without coma: Secondary | ICD-10-CM | POA: Diagnosis not present

## 2017-07-28 DIAGNOSIS — F10239 Alcohol dependence with withdrawal, unspecified: Secondary | ICD-10-CM | POA: Diagnosis present

## 2017-07-28 DIAGNOSIS — Z888 Allergy status to other drugs, medicaments and biological substances status: Secondary | ICD-10-CM | POA: Diagnosis not present

## 2017-07-28 DIAGNOSIS — D72829 Elevated white blood cell count, unspecified: Secondary | ICD-10-CM

## 2017-07-28 DIAGNOSIS — R1013 Epigastric pain: Secondary | ICD-10-CM | POA: Diagnosis present

## 2017-07-28 DIAGNOSIS — F10939 Alcohol use, unspecified with withdrawal, unspecified: Secondary | ICD-10-CM | POA: Diagnosis present

## 2017-07-28 DIAGNOSIS — R112 Nausea with vomiting, unspecified: Secondary | ICD-10-CM

## 2017-07-28 DIAGNOSIS — F329 Major depressive disorder, single episode, unspecified: Secondary | ICD-10-CM | POA: Diagnosis present

## 2017-07-28 DIAGNOSIS — Z9114 Patient's other noncompliance with medication regimen: Secondary | ICD-10-CM

## 2017-07-28 DIAGNOSIS — R569 Unspecified convulsions: Secondary | ICD-10-CM | POA: Diagnosis present

## 2017-07-28 DIAGNOSIS — Z9119 Patient's noncompliance with other medical treatment and regimen: Secondary | ICD-10-CM | POA: Diagnosis not present

## 2017-07-28 DIAGNOSIS — Z801 Family history of malignant neoplasm of trachea, bronchus and lung: Secondary | ICD-10-CM | POA: Diagnosis not present

## 2017-07-28 DIAGNOSIS — Z800 Family history of malignant neoplasm of digestive organs: Secondary | ICD-10-CM | POA: Diagnosis not present

## 2017-07-28 DIAGNOSIS — E7800 Pure hypercholesterolemia, unspecified: Secondary | ICD-10-CM | POA: Diagnosis present

## 2017-07-28 LAB — I-STAT VENOUS BLOOD GAS, ED
Acid-base deficit: 3 mmol/L — ABNORMAL HIGH (ref 0.0–2.0)
Bicarbonate: 22.3 mmol/L (ref 20.0–28.0)
O2 Saturation: 69 %
PH VEN: 7.368 (ref 7.250–7.430)
TCO2: 23 mmol/L (ref 22–32)
pCO2, Ven: 38.7 mmHg — ABNORMAL LOW (ref 44.0–60.0)
pO2, Ven: 37 mmHg (ref 32.0–45.0)

## 2017-07-28 LAB — BASIC METABOLIC PANEL
ANION GAP: 16 — AB (ref 5–15)
Anion gap: 21 — ABNORMAL HIGH (ref 5–15)
BUN: 8 mg/dL (ref 6–20)
BUN: 7 mg/dL (ref 6–20)
CALCIUM: 8.8 mg/dL — AB (ref 8.9–10.3)
CALCIUM: 8 mg/dL — AB (ref 8.9–10.3)
CHLORIDE: 92 mmol/L — AB (ref 101–111)
CHLORIDE: 99 mmol/L — AB (ref 101–111)
CO2: 19 mmol/L — ABNORMAL LOW (ref 22–32)
CO2: 20 mmol/L — AB (ref 22–32)
CREATININE: 1.07 mg/dL (ref 0.61–1.24)
Creatinine, Ser: 1.1 mg/dL (ref 0.61–1.24)
GFR calc non Af Amer: >60 mL/min (ref >60)
GFR calc non Af Amer: >60 mL/min (ref >60)
GLUCOSE: 205 mg/dL — AB (ref 65–99)
Glucose, Bld: 300 mg/dL — ABNORMAL HIGH (ref 65–99)
POTASSIUM: 4.4 mmol/L (ref 3.5–5.1)
Potassium: 4.2 mmol/L (ref 3.5–5.1)
SODIUM: 132 mmol/L — AB (ref 135–145)
Sodium: 135 mmol/L (ref 135–145)

## 2017-07-28 LAB — CBC WITH DIFFERENTIAL/PLATELET
BASOS ABS: 0 10*3/uL (ref 0.0–0.1)
Basophils Relative: 0 %
Eosinophils Absolute: 0 10*3/uL (ref 0.0–0.7)
Eosinophils Relative: 0 %
HCT: 39.9 % (ref 39.0–52.0)
Hemoglobin: 14 g/dL (ref 13.0–17.0)
LYMPHS PCT: 3 %
Lymphs Abs: 0.4 10*3/uL — ABNORMAL LOW (ref 0.7–4.0)
MCH: 31 pg (ref 26.0–34.0)
MCHC: 35.1 g/dL (ref 30.0–36.0)
MCV: 88.3 fL (ref 78.0–100.0)
MONO ABS: 0.2 10*3/uL (ref 0.1–1.0)
Monocytes Relative: 2 %
NEUTROS ABS: 12.7 10*3/uL — AB (ref 1.7–7.7)
Neutrophils Relative %: 95 %
Platelets: 337 10*3/uL (ref 150–400)
RBC: 4.52 MIL/uL (ref 4.22–5.81)
RDW: 13.5 % (ref 11.5–15.5)
WBC: 13.4 10*3/uL — ABNORMAL HIGH (ref 4.0–10.5)

## 2017-07-28 LAB — LIPASE, BLOOD: LIPASE: 16 U/L (ref 11–51)

## 2017-07-28 LAB — RAPID URINE DRUG SCREEN, HOSP PERFORMED
AMPHETAMINES: NOT DETECTED
BENZODIAZEPINES: NOT DETECTED
Barbiturates: NOT DETECTED
Cocaine: NOT DETECTED
OPIATES: NOT DETECTED
Tetrahydrocannabinol: NOT DETECTED

## 2017-07-28 LAB — COMPREHENSIVE METABOLIC PANEL
ALT: 54 U/L (ref 17–63)
AST: 70 U/L — AB (ref 15–41)
Albumin: 3.8 g/dL (ref 3.5–5.0)
Alkaline Phosphatase: 116 U/L (ref 38–126)
Anion gap: 23 — ABNORMAL HIGH (ref 5–15)
BILIRUBIN TOTAL: 2.3 mg/dL — AB (ref 0.3–1.2)
BUN: 9 mg/dL (ref 6–20)
CALCIUM: 8.8 mg/dL — AB (ref 8.9–10.3)
CO2: 16 mmol/L — ABNORMAL LOW (ref 22–32)
Chloride: 93 mmol/L — ABNORMAL LOW (ref 101–111)
Creatinine, Ser: 1.14 mg/dL (ref 0.61–1.24)
GFR calc Af Amer: >60 mL/min (ref >60)
GLUCOSE: 342 mg/dL — AB (ref 65–99)
Potassium: 4.3 mmol/L (ref 3.5–5.1)
Sodium: 132 mmol/L — ABNORMAL LOW (ref 135–145)
TOTAL PROTEIN: 7.3 g/dL (ref 6.5–8.1)

## 2017-07-28 LAB — URINALYSIS, ROUTINE W REFLEX MICROSCOPIC
BACTERIA UA: NONE SEEN
Bilirubin Urine: NEGATIVE
Hgb urine dipstick: NEGATIVE
Ketones, ur: 80 mg/dL — AB
Leukocytes, UA: NEGATIVE
Nitrite: NEGATIVE
PH: 5 (ref 5.0–8.0)
Protein, ur: NEGATIVE mg/dL
RBC / HPF: NONE SEEN RBC/hpf (ref 0–5)
SQUAMOUS EPITHELIAL / LPF: NONE SEEN
Specific Gravity, Urine: 1.029 (ref 1.005–1.030)

## 2017-07-28 LAB — I-STAT CHEM 8, ED
BUN: 7 mg/dL (ref 6–20)
CALCIUM ION: 1.02 mmol/L — AB (ref 1.15–1.40)
CHLORIDE: 95 mmol/L — AB (ref 101–111)
Creatinine, Ser: 0.8 mg/dL (ref 0.61–1.24)
Glucose, Bld: 347 mg/dL — ABNORMAL HIGH (ref 65–99)
HCT: 45 % (ref 39.0–52.0)
HEMOGLOBIN: 15.3 g/dL (ref 13.0–17.0)
Potassium: 4.4 mmol/L (ref 3.5–5.1)
SODIUM: 132 mmol/L — AB (ref 135–145)
TCO2: 21 mmol/L — AB (ref 22–32)

## 2017-07-28 LAB — CBG MONITORING, ED
GLUCOSE-CAPILLARY: 390 mg/dL — AB (ref 65–99)
GLUCOSE-CAPILLARY: 263 mg/dL — AB (ref 65–99)
GLUCOSE-CAPILLARY: 136 mg/dL — AB (ref 65–99)
GLUCOSE-CAPILLARY: 139 mg/dL — AB (ref 65–99)
Glucose-Capillary: 204 mg/dL — ABNORMAL HIGH (ref 65–99)

## 2017-07-28 LAB — ETHANOL

## 2017-07-28 LAB — BETA-HYDROXYBUTYRIC ACID: Beta-Hydroxybutyric Acid: 4.72 mmol/L — ABNORMAL HIGH (ref 0.05–0.27)

## 2017-07-28 MED ORDER — FOLIC ACID 1 MG PO TABS
1.0000 mg | ORAL_TABLET | Freq: Every day | ORAL | Status: DC
  Administered 2017-07-28 – 2017-07-30 (×3): 1 mg via ORAL
  Filled 2017-07-28 (×3): qty 1

## 2017-07-28 MED ORDER — POTASSIUM CHLORIDE 10 MEQ/100ML IV SOLN
10.0000 meq | INTRAVENOUS | Status: AC
  Administered 2017-07-28 – 2017-07-29 (×3): 10 meq via INTRAVENOUS
  Filled 2017-07-28 (×4): qty 100

## 2017-07-28 MED ORDER — SODIUM CHLORIDE 0.9 % IV SOLN
INTRAVENOUS | Status: DC
  Administered 2017-07-28: 2 [IU]/h via INTRAVENOUS
  Filled 2017-07-28: qty 1

## 2017-07-28 MED ORDER — LORAZEPAM 2 MG/ML IJ SOLN
1.0000 mg | Freq: Once | INTRAMUSCULAR | Status: AC
  Administered 2017-07-28: 1 mg via INTRAVENOUS
  Filled 2017-07-28: qty 1

## 2017-07-28 MED ORDER — GI COCKTAIL ~~LOC~~
30.0000 mL | Freq: Once | ORAL | Status: AC
  Administered 2017-07-28: 30 mL via ORAL
  Filled 2017-07-28: qty 30

## 2017-07-28 MED ORDER — SODIUM CHLORIDE 0.9 % IV SOLN
INTRAVENOUS | Status: DC
  Administered 2017-07-28: 22:00:00 via INTRAVENOUS

## 2017-07-28 MED ORDER — ADULT MULTIVITAMIN W/MINERALS CH
1.0000 | ORAL_TABLET | Freq: Every day | ORAL | Status: DC
  Administered 2017-07-28 – 2017-07-30 (×3): 1 via ORAL
  Filled 2017-07-28 (×3): qty 1

## 2017-07-28 MED ORDER — SODIUM CHLORIDE 0.9 % IV BOLUS (SEPSIS)
1000.0000 mL | Freq: Once | INTRAVENOUS | Status: AC
  Administered 2017-07-28: 1000 mL via INTRAVENOUS

## 2017-07-28 MED ORDER — DEXTROSE-NACL 5-0.45 % IV SOLN: INTRAVENOUS | Status: DC

## 2017-07-28 MED ORDER — SODIUM CHLORIDE 0.9 % IV SOLN
INTRAVENOUS | Status: DC
  Administered 2017-07-28: 0.8 [IU]/h via INTRAVENOUS
  Filled 2017-07-28: qty 1

## 2017-07-28 MED ORDER — LORAZEPAM 1 MG PO TABS
1.0000 mg | ORAL_TABLET | Freq: Four times a day (QID) | ORAL | Status: DC | PRN
  Administered 2017-07-29 – 2017-07-30 (×3): 1 mg via ORAL
  Filled 2017-07-28 (×3): qty 1

## 2017-07-28 MED ORDER — ESCITALOPRAM OXALATE 20 MG PO TABS
20.0000 mg | ORAL_TABLET | Freq: Every day | ORAL | Status: DC
  Administered 2017-07-29 – 2017-07-30 (×2): 20 mg via ORAL
  Filled 2017-07-28: qty 1
  Filled 2017-07-28: qty 2

## 2017-07-28 MED ORDER — IOPAMIDOL (ISOVUE-300) INJECTION 61%
INTRAVENOUS | Status: AC
  Administered 2017-07-28: 100 mL
  Filled 2017-07-28: qty 100

## 2017-07-28 MED ORDER — HYDROMORPHONE HCL 1 MG/ML IJ SOLN
1.0000 mg | Freq: Once | INTRAMUSCULAR | Status: AC
  Administered 2017-07-28: 1 mg via INTRAVENOUS
  Filled 2017-07-28: qty 1

## 2017-07-28 MED ORDER — VITAMIN B-1 100 MG PO TABS
100.0000 mg | ORAL_TABLET | Freq: Every day | ORAL | Status: DC
  Administered 2017-07-28 – 2017-07-30 (×3): 100 mg via ORAL
  Filled 2017-07-28 (×3): qty 1

## 2017-07-28 MED ORDER — KETOROLAC TROMETHAMINE 30 MG/ML IJ SOLN
30.0000 mg | Freq: Once | INTRAMUSCULAR | Status: AC
  Administered 2017-07-28: 30 mg via INTRAVENOUS
  Filled 2017-07-28: qty 1

## 2017-07-28 MED ORDER — POTASSIUM CHLORIDE 10 MEQ/100ML IV SOLN
10.0000 meq | INTRAVENOUS | Status: AC
  Administered 2017-07-28 (×2): 10 meq via INTRAVENOUS
  Filled 2017-07-28 (×2): qty 100

## 2017-07-28 MED ORDER — RANITIDINE HCL 150 MG/10ML PO SYRP
150.0000 mg | ORAL_SOLUTION | Freq: Once | ORAL | Status: AC
Start: 2017-07-28 — End: 2017-07-28
  Administered 2017-07-28: 150 mg via ORAL
  Filled 2017-07-28: qty 10

## 2017-07-28 MED ORDER — AMLODIPINE BESYLATE 10 MG PO TABS
10.0000 mg | ORAL_TABLET | Freq: Every day | ORAL | Status: DC
  Administered 2017-07-28 – 2017-07-30 (×3): 10 mg via ORAL
  Filled 2017-07-28: qty 1
  Filled 2017-07-28 (×2): qty 2

## 2017-07-28 MED ORDER — LORAZEPAM 2 MG/ML IJ SOLN
1.0000 mg | Freq: Four times a day (QID) | INTRAMUSCULAR | Status: DC | PRN
  Administered 2017-07-28 – 2017-07-29 (×3): 1 mg via INTRAVENOUS
  Filled 2017-07-28 (×3): qty 1

## 2017-07-28 MED ORDER — THIAMINE HCL 100 MG/ML IJ SOLN: 100.0000 mg | Freq: Every day | INTRAMUSCULAR | Status: DC

## 2017-07-28 MED ORDER — SODIUM CHLORIDE 0.9 % IV SOLN
1000.0000 mL | INTRAVENOUS | Status: DC
  Administered 2017-07-28: 1000 mL via INTRAVENOUS

## 2017-07-28 MED ORDER — ONDANSETRON HCL 4 MG/2ML IJ SOLN
4.0000 mg | Freq: Once | INTRAMUSCULAR | Status: AC
  Administered 2017-07-28: 4 mg via INTRAVENOUS
  Filled 2017-07-28: qty 2

## 2017-07-28 MED ORDER — QUETIAPINE FUMARATE 300 MG PO TABS
300.0000 mg | ORAL_TABLET | Freq: Every day | ORAL | Status: DC
  Administered 2017-07-28 – 2017-07-29 (×2): 300 mg via ORAL
  Filled 2017-07-28 (×2): qty 1

## 2017-07-28 MED ORDER — DEXTROSE-NACL 5-0.45 % IV SOLN
INTRAVENOUS | Status: DC
  Administered 2017-07-28 – 2017-07-29 (×2): via INTRAVENOUS

## 2017-07-28 NOTE — ED Notes (Signed)
Patient transported to CT 

## 2017-07-28 NOTE — ED Triage Notes (Signed)
Pt BIB [facility] from home, c/o abdominal pain with nausea/vomiting starting this am. EMS reports that pt vomited "10 liters" total today. Hx alcohol abuse and pancreatitis, pt reports that he drinks regularly, hasn't had any alcohol since Saturday. Tachycardic on arrival.

## 2017-07-28 NOTE — ED Notes (Signed)
CIWA 11

## 2017-07-28 NOTE — H&P (Signed)
History and Physical    Willie [name]:[id number] DOB: April 11, 1976 DOA: 07/28/2017  PCP: Debbrah Alar, NP  Patient coming from: Home  Chief Complaint: Nausea and vomiting  HPI: Willie [name] is [name] 42 y.o. male with medical history significant of alcohol abuse, diabetes, GERD comes in after 2 days of binge drinking in [name] much alcohol because him and his wife just split and separated.  Patient had been previously sober for over 6 months however this recent split with his wife caused him to fall off the wagon.  He also stopped taking all of his medications over 2 days ago.  He denies any fevers.  He got really sick today with nausea and vomiting and he reports his glucose was high.  He realized that he had to stop drinking.  So he came to the emergency department.  Patient is found to be in DKA and having some alcohol withdrawal symptoms.  Patient feeling better with IV fluids insulin and [name] Ativan in the ED.  Patient referred for admission for DKA and alcohol withdrawal.  He is very interested in getting sober again.  Review of Systems: As per HPI otherwise 10 point review of systems negative.   Past Medical History:  Diagnosis Date  . Alcohol abuse   . Alcoholic pancreatitis    recurrent  . Depression   . Diabetes mellitus, type II ([place])    New Onset 03/2010  . GERD (gastroesophageal reflux disease)   . High cholesterol   . 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 Hypertension   . Sleep apnea    does not wear [name] CPAP    Past Surgical History:  Procedure Laterality Date  . COLONOSCOPY W/ BIOPSIES  09/2010   Dr. Ardis Hughs.  for intermittent rectal bleeding.  Mild sigmoid to descending diverticulosis.  Mild left colon erythema, benign biopsy, probably "prep effect"  . COLONOSCOPY WITH PROPOFOL N/[name] 07/06/2017   Procedure: COLONOSCOPY WITH PROPOFOL;  Surgeon: Jerene Bears, MD;  Location: [facility];  Service: Gastroenterology;   Laterality: N/[name];  . LUMBAR MICRODISCECTOMY  ~ 2004   Dr Ellene Route     reports that he has quit smoking. His smoking use included cigarettes. He started smoking about 14 months ago. He has [name] 0.25 pack-year smoking history. He has never used smokeless tobacco. He reports that he drinks about 25.2 oz of alcohol per week. He reports that he does not use drugs.  Allergies  Allergen Reactions  . Invokamet [Canagliflozin-Metformin Hcl] Other (See Comments)    Lactic acidosis  . Metformin And Related Other (See Comments)    DRASTIC drop in blood sugar    Family History  Problem Relation Age of Onset  . Diabetes Mother   . Lung cancer Brother        twin brother  . Pancreatic cancer Paternal Aunt   . Colon cancer Neg Hx   . Stomach cancer Neg Hx     Prior to Admission medications   Medication Sig Start Date End Date Taking? Authorizing Provider  amLODipine (NORVASC) 10 MG tablet Take 1 tablet (10 mg total) by mouth daily. 02/27/17  Yes Debbrah Alar, NP  cloNIDine (CATAPRES) 0.1 MG tablet Take 1 tablet (0.1 mg total) by mouth 3 (three) times daily. 02/27/17  Yes Debbrah Alar, NP  escitalopram (LEXAPRO) 20 MG tablet Take 1 tablet (20 mg total) by mouth daily. 02/27/17  Yes Debbrah Alar, NP  hydrocortisone (ANUSOL-HC) 25 MG suppository Place 1 suppository (25 mg total) rectally 2 (two) times daily. 07/07/17  Yes Bonnielee Haff, MD  insulin lispro (HUMALOG KWIKPEN) 100 UNIT/ML KiwkPen 3 times [name] day (just before each meal) 09-10-08 units, Patient taking differently: Inject 5-15 Units into the skin See admin instructions. Inject 15 units into the skin with breakfast then 5 units with lunch then 15 units with dinner (evening meal) 02/27/17  Yes Debbrah Alar, NP  LANTUS SOLOSTAR 100 UNIT/ML Solostar Pen INJECT 40 UNITS INTO THE SKIN DAILY AT 10 PM. 07/04/17  Yes Debbrah Alar, NP  lisinopril (PRINIVIL,ZESTRIL) 20 MG tablet TAKE 1 TABLET (20 MG TOTAL) BY MOUTH DAILY. Patient  taking differently: Take 20 mg by mouth daily.  02/27/17  Yes Debbrah Alar, NP  Multiple Vitamins-Minerals (MULTIVITAMIN) tablet Take 1 tablet by mouth daily. 02/12/16  Yes Palumbo, April, MD  ondansetron (ZOFRAN ODT) 4 MG disintegrating tablet Take 1 tablet (4 mg total) by mouth every 6 (six) hours as needed for nausea or vomiting. 07/02/17  Yes Ward, Kristen N, DO  pantoprazole (PROTONIX) 40 MG tablet Take 1 tablet (40 mg total) by mouth 2 (two) times daily. 03/27/17  Yes Barton Dubois, MD  potassium chloride (K-DUR,KLOR-CON) 10 MEQ tablet Take 2 tablets (20 mEq total) by mouth daily. 02/27/17  Yes Debbrah Alar, NP  QUEtiapine (SEROQUEL) 300 MG tablet Take 1 tablet (300 mg total) by mouth at bedtime. 02/27/17  Yes Debbrah Alar, NP  thiamine 100 MG tablet Take 1 tablet (100 mg total) by mouth daily. 02/27/17  Yes Debbrah Alar, NP  Blood Glucose Monitoring Suppl (FREESTYLE FREEDOM LITE) w/Device KIT 2 (two) times daily 02/27/17   Debbrah Alar, NP  glucose blood (FREESTYLE LITE) test strip Use as instructed 02/27/17   Debbrah Alar, NP  Insulin Pen Needle (ULTICARE SHORT PEN NEEDLES) 31G X 8 MM MISC Use once daily to inject insulin. 02/27/17   Debbrah Alar, NP  Lancets (FREESTYLE) lancets 2 (two) times daily 02/27/17   Debbrah Alar, NP  LORazepam (ATIVAN) 0.5 MG tablet Take 1 tablet (0.5 mg total) by mouth every 8 (eight) hours as needed for anxiety. Patient not taking: Reported on 07/28/2017 07/07/17   Bonnielee Haff, MD  oxyCODONE (OXY IR/ROXICODONE) 5 MG immediate release tablet Take 1 tablet (5 mg total) by mouth every 6 (six) hours as needed for severe pain. Patient not taking: Reported on 07/28/2017 07/07/17   Bonnielee Haff, MD    Physical Exam: Vitals:   07/28/17 1915 07/28/17 1930 07/28/17 2000 07/28/17 2030  BP: (!) 178/114 (!) 184/119  (!) 175/116  Pulse: (!) 121 (!) 118 (!) 118 (!) 117  Resp: 20 12 (!) 22 18  SpO2: 100% 100% 100% 100%       Constitutional: NAD, calm, comfortable Vitals:   07/28/17 1915 07/28/17 1930 07/28/17 2000 07/28/17 2030  BP: (!) 178/114 (!) 184/119  (!) 175/116  Pulse: (!) 121 (!) 118 (!) 118 (!) 117  Resp: 20 12 (!) 22 18  SpO2: 100% 100% 100% 100%   Eyes: PERRL, lids and conjunctivae normal ENMT: Mucous membranes are moist. Posterior pharynx clear of any exudate or lesions.Normal dentition.  Neck: normal, supple, no masses, no thyromegaly Respiratory: clear to auscultation bilaterally, no wheezing, no crackles. Normal respiratory effort. No accessory muscle use.  Cardiovascular: Regular rate and rhythm, no murmurs / rubs / gallops. No extremity edema. 2+ pedal pulses. No carotid bruits.  Abdomen: no tenderness, no masses palpated. No hepatosplenomegaly. Bowel sounds positive.  Musculoskeletal:  no clubbing / cyanosis. No joint deformity upper and lower extremities. Good ROM, no contractures. Normal muscle tone.  Skin: no rashes, lesions, ulcers. No induration Neurologic: CN 2-12 grossly intact. Sensation intact, DTR normal. Strength 5/5 in all 4.  Psychiatric: Normal judgment and insight. Alert and oriented x 3. Normal mood.    Labs on Admission: I have personally reviewed following labs and imaging studies  CBC: Recent Labs  Lab 07/28/17 1734 07/28/17 1749  WBC 13.4*  --   NEUTROABS 12.7*  --   HGB 14.0 15.3  HCT 39.9 45.0  MCV 88.3  --   PLT 337  --    Basic Metabolic Panel: Recent Labs  Lab 07/28/17 1734 07/28/17 1749 07/28/17 1902  NA 132* 132* 132*  K 4.3 4.4 4.2  CL 93* 95* 92*  CO2 16*  --  19*  GLUCOSE 342* 347* 300*  BUN _0 CREATININE 1.14 0.80 1.07  CALCIUM 8.8*  --  8.8*   GFR: CrCl cannot be calculated (Unknown ideal weight.). Liver Function Tests: Recent Labs  Lab 07/28/17 1734  AST 70*  ALT 54  ALKPHOS 116  BILITOT 2.3*  PROT 7.3  ALBUMIN 3.8   Recent Labs  Lab 07/28/17 1734  LIPASE 16   No results for input(s): AMMONIA in the last  168 hours. Coagulation Profile: No results for input(s): INR, PROTIME in the last 168 hours. Cardiac Enzymes: No results for input(s): CKTOTAL, CKMB, CKMBINDEX, TROPONINI in the last 168 hours. BNP (last 3 results) No results for input(s): PROBNP in the last 8760 hours. HbA1C: No results for input(s): HGBA1C in the last 72 hours. CBG: Recent Labs  Lab 07/28/17 1654 07/28/17 1954  GLUCAP 390* 263*   Lipid Profile: No results for input(s): CHOL, HDL, LDLCALC, TRIG, CHOLHDL, LDLDIRECT in the last 72 hours. Thyroid Function Tests: No results for input(s): TSH, T4TOTAL, FREET4, T3FREE, THYROIDAB in the last 72 hours. Anemia Panel: No results for input(s): VITAMINB12, FOLATE, FERRITIN, TIBC, IRON, RETICCTPCT in the last 72 hours. Urine analysis:    Component Value Date/Time   COLORURINE YELLOW 07/28/2017 1756   APPEARANCEUR CLEAR 07/28/2017 1756   LABSPEC 1.029 07/28/2017 1756   PHURINE 5.0 07/28/2017 1756   GLUCOSEU >=500 ([name]) 07/28/2017 1756   HGBUR NEGATIVE 07/28/2017 1756   BILIRUBINUR NEGATIVE 07/28/2017 1756   KETONESUR 80 ([name]) 07/28/2017 1756   PROTEINUR NEGATIVE 07/28/2017 1756   UROBILINOGEN 0.2 11/17/2014 1315   NITRITE NEGATIVE 07/28/2017 1756   LEUKOCYTESUR NEGATIVE 07/28/2017 1756   Sepsis Labs: !!!!!!!!!!!!!!!!!!!!!!!!!!!!!!!!!!!!!!!!!!!! _1 (procalcitonin:4,lacticidven:4) )No results found for this or any previous visit (from the past 240 hour(s)).   Radiological Exams on Admission: Ct Abdomen Pelvis W Contrast  Result Date: 07/28/2017 CLINICAL DATA:  Abdominal pain with nausea and vomiting beginning this morning, history of pancreatitis, alcohol abuse, diabetes mellitus, hypertension, former smoker, depression EXAM: CT ABDOMEN AND PELVIS WITH CONTRAST TECHNIQUE: Multidetector CT imaging of the abdomen and pelvis was performed using the standard protocol following bolus administration of intravenous contrast. Sagittal and coronal MPR images reconstructed from  axial data set. CONTRAST:  135m ISOVUE-300 IOPAMIDOL (ISOVUE-300) INJECTION 61% IV. No oral contrast. COMPARISON:  07/04/2017 FINDINGS: Lower chest: Lung bases clear Hepatobiliary: Diffuse fatty infiltration of liver. Small vague area of low attenuation in the LEFT lobe 6 mm diameter grossly stable. Gallbladder and liver otherwise unremarkable. Pancreas: Atrophic pancreas. Parenchymal calcifications at body/head consistent with chronic calcific pancreatitis. No obvious pancreatic mass. Spleen: Small spleen without mass lesion. Small splenule at  splenic hilum. Adrenals/Urinary Tract: Adrenal glands normal appearance. Kidneys, ureters, and bladder normal appearance Stomach/Bowel: Minimal sigmoid diverticulosis without evidence of diverticulitis. Few additional diverticula at descending colon. Normal appendix. Stomach distended by fluid. Remaining bowel loops normal appearance. Vascular/Lymphatic: Aorta normal caliber. Vascular structures grossly patent. No adenopathy. Reproductive: Unremarkable Other: Small umbilical and tiny LEFT inguinal hernias containing fat. No free intraperitoneal air or fluid. Musculoskeletal: Degenerative disc disease changes lumbar spine. IMPRESSION: Diffuse fatty infiltration of liver with stable tiny nonspecific low-attenuation focus. Chronic calcific pancreatitis. Distal colonic diverticulosis without evidence of diverticulitis. Small umbilical and tiny RIGHT inguinal hernias containing fat. No definite acute intra-abdominal or intrapelvic process identified. Electronically Signed   By: Lavonia Dana M.D.   On: 07/28/2017 18:59   Dg Chest Port 1 View  Result Date: 07/28/2017 CLINICAL DATA:  Abdominal pain EXAM: PORTABLE CHEST 1 VIEW COMPARISON:  06/07/2017 FINDINGS: Normal heart size. Lungs clear. No pneumothorax. No pleural effusion. IMPRESSION: No active cardiopulmonary disease. Electronically Signed   By: Marybelle Killings M.D.   On: 07/28/2017 20:13    EKG: Independently reviewed.   Sinus tachycardia no acute changes Old chart reviewed Case discussed with EDP Chest x-ray reviewed no edema or infiltrate  Assessment/Plan 42 year old male diabetic comes in in DKA after binge drinking for 2 days currently withdrawing  Principal Problem:   DKA (diabetic ketoacidoses) (HCC)-heart rate improved after 2 L of IV fluids.  Continue on insulin drip.  Serial BMP every 4 hours to monitor electrolytes.  Glucose checks.  DKA pathway instituted.  Also placed on alcohol withdrawal pathway.  No source of infection with normal urine and negative chest x-ray.  This is just all due to noncompliance with stopping all his medicines over 2 days ago.  Anion gap initially 23.  Active Problems:   Essential hypertension-continue home meds   Chronic pancreatitis (HCC)-stable   Alcohol abuse-patient interested in stopping drinking   Anemia of chronic disease-stable   Alcohol withdrawal (HCC)-placed on alcohol withdrawal pathway.  Give multivitamin folic acid and thiamine.  Ativan.  Monitor closely for worsening withdrawal symptoms.    DVT prophylaxis: SCDs Code Status: Full Family Communication: None Disposition Plan: Per day team Consults called: None Admission status: Admission   Willie Dawes A MD [facility]  If 7PM-7AM, please contact night-coverage [email or web address] Password [facility]  07/28/2017, 8:37 PM

## 2017-07-28 NOTE — ED Provider Notes (Signed)
[facility] EMERGENCY DEPARTMENT Provider Note   CSN: [id number] Arrival date & time: 07/28/17  1647     History   Chief Complaint Chief Complaint  Patient presents with  . Abdominal Pain    HPI Willie DUDDING is a 42 y.o. male.   Abdominal Pain   This is a new problem. The current episode started 6 to 12 hours ago. The problem occurs constantly. The problem has been gradually worsening. The pain is located in the generalized abdominal region and epigastric region. The quality of the pain is dull. The pain is severe. Associated symptoms include anorexia, nausea and vomiting. Pertinent negatives include fever, diarrhea, melena, constipation, dysuria, hematuria, headaches and arthralgias.  History of alcoholic pancreatitis and alcohol abuse.  Type 2 diabetes, hypertension.  He states he vomited about 10 L of fluid.  He states his last drink was on Saturday.  He states he has become shaky before from alcohol withdrawal.  Patient denies any diarrhea.  He states he has severe abdominal pain.  Past Medical History:  Diagnosis Date  . Alcohol abuse   . Alcoholic pancreatitis    recurrent  . Depression   . Diabetes mellitus, type II ([place])    New Onset 03/2010  . GERD (gastroesophageal reflux disease)   . High cholesterol   . 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 Hypertension   . Sleep apnea    does not wear a CPAP    Patient Active Problem List   Diagnosis Date Noted  . Abnormal CT scan, colon   . Benign neoplasm of descending colon   . Lower GI bleed 07/04/2017  . Transaminitis 07/04/2017  . Pain   . Bleeding internal hemorrhoids   . Anemia of chronic disease   . Left arm weakness   . Anemia, blood loss   . Vomiting alone 03/27/2017  . Chest pain 03/27/2017  . Nausea and vomiting   . Lactic acidosis 03/26/2017  . History of alcohol abuse 06/25/2016  . Tobacco abuse 06/25/2016  . Anxiety state  11/20/2015  . Dental infection 07/10/2015  . Mood disorder ([place]) 04/18/2015  . Eczema 06/27/2014  . GERD (gastroesophageal reflux disease) 05/24/2014  . Snoring 04/07/2013  . Suicide attempt by substance overdose ([place]) 02/10/2013  . Alcohol abuse 12/31/2012  . Erectile dysfunction 12/09/2012  . Hematuria 06/11/2012  . Vision problem 02/02/2012  . Insomnia 12/20/2011  . Chronic pancreatitis ([place]) 02/05/2011  . Depression with anxiety 11/13/2010  . Hyperlipemia 06/27/2010  . Diabetes type 2, uncontrolled ([place]) 05/22/2010  . Essential hypertension 05/22/2010    Past Surgical History:  Procedure Laterality Date  . COLONOSCOPY W/ BIOPSIES  09/2010   Dr. Ardis Hughs.  for intermittent rectal bleeding.  Mild sigmoid to descending diverticulosis.  Mild left colon erythema, benign biopsy, probably "prep effect"  . COLONOSCOPY WITH PROPOFOL N/A 07/06/2017   Procedure: COLONOSCOPY WITH PROPOFOL;  Surgeon: Jerene Bears, MD;  Location: [facility];  Service: Gastroenterology;  Laterality: N/A;  . LUMBAR MICRODISCECTOMY  ~ 2004   Dr Ellene Route        Home Medications    Prior to Admission medications   Medication Sig Start Date End Date Taking? Authorizing Provider  amLODipine (NORVASC) 10 MG tablet Take 1 tablet (10 mg total) by mouth daily. 02/27/17  Yes Debbrah Alar, NP  cloNIDine (CATAPRES) 0.1 MG tablet Take 1 tablet (0.1 mg total) by mouth 3 (three)  times daily. 02/27/17  Yes Debbrah Alar, NP  escitalopram (LEXAPRO) 20 MG tablet Take 1 tablet (20 mg total) by mouth daily. 02/27/17  Yes Debbrah Alar, NP  hydrocortisone (ANUSOL-HC) 25 MG suppository Place 1 suppository (25 mg total) rectally 2 (two) times daily. 07/07/17  Yes Bonnielee Haff, MD  insulin lispro (HUMALOG KWIKPEN) 100 UNIT/ML KiwkPen 3 times a day (just before each meal) 09-10-08 units, Patient taking differently: Inject 5-15 Units into the skin See admin instructions. Inject 15 units into the skin with breakfast  then 5 units with lunch then 15 units with dinner (evening meal) 02/27/17  Yes Debbrah Alar, NP  LANTUS SOLOSTAR 100 UNIT/ML Solostar Pen INJECT 40 UNITS INTO THE SKIN DAILY AT 10 PM. 07/04/17  Yes Debbrah Alar, NP  lisinopril (PRINIVIL,ZESTRIL) 20 MG tablet TAKE 1 TABLET (20 MG TOTAL) BY MOUTH DAILY. Patient taking differently: Take 20 mg by mouth daily.  02/27/17  Yes Debbrah Alar, NP  Multiple Vitamins-Minerals (MULTIVITAMIN) tablet Take 1 tablet by mouth daily. 02/12/16  Yes Palumbo, April, MD  ondansetron (ZOFRAN ODT) 4 MG disintegrating tablet Take 1 tablet (4 mg total) by mouth every 6 (six) hours as needed for nausea or vomiting. 07/02/17  Yes Ward, Kristen N, DO  pantoprazole (PROTONIX) 40 MG tablet Take 1 tablet (40 mg total) by mouth 2 (two) times daily. 03/27/17  Yes Barton Dubois, MD  potassium chloride (K-DUR,KLOR-CON) 10 MEQ tablet Take 2 tablets (20 mEq total) by mouth daily. 02/27/17  Yes Debbrah Alar, NP  QUEtiapine (SEROQUEL) 300 MG tablet Take 1 tablet (300 mg total) by mouth at bedtime. 02/27/17  Yes Debbrah Alar, NP  thiamine 100 MG tablet Take 1 tablet (100 mg total) by mouth daily. 02/27/17  Yes Debbrah Alar, NP  Blood Glucose Monitoring Suppl (FREESTYLE FREEDOM LITE) w/Device KIT 2 (two) times daily 02/27/17   Debbrah Alar, NP  glucose blood (FREESTYLE LITE) test strip Use as instructed 02/27/17   Debbrah Alar, NP  Insulin Pen Needle (ULTICARE SHORT PEN NEEDLES) 31G X 8 MM MISC Use once daily to inject insulin. 02/27/17   Debbrah Alar, NP  Lancets (FREESTYLE) lancets 2 (two) times daily 02/27/17   Debbrah Alar, NP  LORazepam (ATIVAN) 0.5 MG tablet Take 1 tablet (0.5 mg total) by mouth every 8 (eight) hours as needed for anxiety. Patient not taking: Reported on 07/28/2017 07/07/17   Bonnielee Haff, MD  oxyCODONE (OXY IR/ROXICODONE) 5 MG immediate release tablet Take 1 tablet (5 mg total) by mouth every 6 (six) hours as  needed for severe pain. Patient not taking: Reported on 07/28/2017 07/07/17   Bonnielee Haff, MD    Family History Family History  Problem Relation Age of Onset  . Diabetes Mother   . Lung cancer Brother        twin brother  . Pancreatic cancer Paternal Aunt   . Colon cancer Neg Hx   . Stomach cancer Neg Hx     Social History Social History   Tobacco Use  . Smoking status: Former Smoker    Packs/day: 0.25    Years: 1.00    Pack years: 0.25    Types: Cigarettes    Start date: 04/29/2016  . Smokeless tobacco: Never Used  . Tobacco comment: 4 Cigarettes a day   Substance Use Topics  . Alcohol use: Yes    Alcohol/week: 25.2 oz    Types: 42 Cans of beer per week  . Drug use: No    Comment: none in years  Allergies   Invokamet [canagliflozin-metformin hcl] and Metformin and related   Review of Systems Review of Systems  Constitutional: Positive for appetite change and chills. Negative for fever.  HENT: Negative for ear pain and sore throat.   Eyes: Negative for pain and visual disturbance.  Respiratory: Negative for cough and shortness of breath.   Cardiovascular: Negative for chest pain and palpitations.  Gastrointestinal: Positive for abdominal pain, anorexia, nausea and vomiting. Negative for blood in stool, constipation, diarrhea and melena.  Genitourinary: Negative for dysuria and hematuria.  Musculoskeletal: Negative for arthralgias and back pain.  Skin: Negative for color change and rash.  Neurological: Negative for syncope, numbness and headaches.  All other systems reviewed and are negative.    Physical Exam Updated Vital Signs BP (!) 184/119   Pulse (!) 118   Resp (!) 22   SpO2 100%   Physical Exam  Constitutional: He appears well-developed and well-nourished. He appears ill.  HENT:  Head: Normocephalic and atraumatic.  Eyes: Conjunctivae are normal. No scleral icterus.  Neck: Neck supple.  Cardiovascular: Regular rhythm. Tachycardia present.    No murmur heard. Pulmonary/Chest: Effort normal and breath sounds normal. Tachypnea noted. No respiratory distress.  Abdominal: Soft. There is generalized tenderness and tenderness in the epigastric area. There is guarding. There is no rigidity, no rebound and no CVA tenderness.  Musculoskeletal: He exhibits no edema.  Neurological: He is alert.  Skin: Skin is warm and dry.  Psychiatric: He has a normal mood and affect.  Nursing note and vitals reviewed.    ED Treatments / Results  Labs (all labs ordered are listed, but only abnormal results are displayed) Labs Reviewed  CBC WITH DIFFERENTIAL/PLATELET - Abnormal; Notable for the following components:      Result Value   WBC 13.4 (*)    Neutro Abs 12.7 (*)    Lymphs Abs 0.4 (*)    All other components within normal limits  COMPREHENSIVE METABOLIC PANEL - Abnormal; Notable for the following components:   Sodium 132 (*)    Chloride 93 (*)    CO2 16 (*)    Glucose, Bld 342 (*)    Calcium 8.8 (*)    AST 70 (*)    Total Bilirubin 2.3 (*)    Anion gap 23 (*)    All other components within normal limits  URINALYSIS, ROUTINE W REFLEX MICROSCOPIC - Abnormal; Notable for the following components:   Glucose, UA >=500 (*)    Ketones, ur 80 (*)    All other components within normal limits  BETA-HYDROXYBUTYRIC ACID - Abnormal; Notable for the following components:   Beta-Hydroxybutyric Acid 4.72 (*)    All other components within normal limits  BASIC METABOLIC PANEL - Abnormal; Notable for the following components:   Sodium 132 (*)    Chloride 92 (*)    CO2 19 (*)    Glucose, Bld 300 (*)    Calcium 8.8 (*)    Anion gap 21 (*)    All other components within normal limits  CBG MONITORING, ED - Abnormal; Notable for the following components:   Glucose-Capillary 390 (*)    All other components within normal limits  CBG MONITORING, ED - Abnormal; Notable for the following components:   Glucose-Capillary 263 (*)    All other  components within normal limits  I-STAT CHEM 8, ED - Abnormal; Notable for the following components:   Sodium 132 (*)    Chloride 95 (*)    Glucose, Bld 347 (*)  Calcium, Ion 1.02 (*)    TCO2 21 (*)    All other components within normal limits  I-STAT VENOUS BLOOD GAS, ED - Abnormal; Notable for the following components:   pCO2, Ven 38.7 (*)    Acid-base deficit 3.0 (*)    All other components within normal limits  LIPASE, BLOOD  ETHANOL  RAPID URINE DRUG SCREEN, HOSP PERFORMED  BLOOD GAS, VENOUS  BASIC METABOLIC PANEL  BASIC METABOLIC PANEL    EKG None  Radiology Ct Abdomen Pelvis W Contrast  Result Date: 07/28/2017 CLINICAL DATA:  Abdominal pain with nausea and vomiting beginning this morning, history of pancreatitis, alcohol abuse, diabetes mellitus, hypertension, former smoker, depression EXAM: CT ABDOMEN AND PELVIS WITH CONTRAST TECHNIQUE: Multidetector CT imaging of the abdomen and pelvis was performed using the standard protocol following bolus administration of intravenous contrast. Sagittal and coronal MPR images reconstructed from axial data set. CONTRAST:  155m ISOVUE-300 IOPAMIDOL (ISOVUE-300) INJECTION 61% IV. No oral contrast. COMPARISON:  07/04/2017 FINDINGS: Lower chest: Lung bases clear Hepatobiliary: Diffuse fatty infiltration of liver. Small vague area of low attenuation in the LEFT lobe 6 mm diameter grossly stable. Gallbladder and liver otherwise unremarkable. Pancreas: Atrophic pancreas. Parenchymal calcifications at body/head consistent with chronic calcific pancreatitis. No obvious pancreatic mass. Spleen: Small spleen without mass lesion. Small splenule at splenic hilum. Adrenals/Urinary Tract: Adrenal glands normal appearance. Kidneys, ureters, and bladder normal appearance Stomach/Bowel: Minimal sigmoid diverticulosis without evidence of diverticulitis. Few additional diverticula at descending colon. Normal appendix. Stomach distended by fluid. Remaining bowel  loops normal appearance. Vascular/Lymphatic: Aorta normal caliber. Vascular structures grossly patent. No adenopathy. Reproductive: Unremarkable Other: Small umbilical and tiny LEFT inguinal hernias containing fat. No free intraperitoneal air or fluid. Musculoskeletal: Degenerative disc disease changes lumbar spine. IMPRESSION: Diffuse fatty infiltration of liver with stable tiny nonspecific low-attenuation focus. Chronic calcific pancreatitis. Distal colonic diverticulosis without evidence of diverticulitis. Small umbilical and tiny RIGHT inguinal hernias containing fat. No definite acute intra-abdominal or intrapelvic process identified. Electronically Signed   By: MLavonia DanaM.D.   On: 07/28/2017 18:59    Procedures Procedures (including critical care time)  Medications Ordered in ED Medications  sodium chloride 0.9 % bolus 1,000 mL (0 mLs Intravenous Stopped 07/28/17 1731)    Followed by  sodium chloride 0.9 % bolus 1,000 mL (1,000 mLs Intravenous New Bag/Given 07/28/17 1901)    Followed by  0.9 %  sodium chloride infusion (1,000 mLs Intravenous New Bag/Given 07/28/17 1933)  insulin regular (NOVOLIN R,HUMULIN R) 100 Units in sodium chloride 0.9 % 100 mL (1 Units/mL) infusion (2 Units/hr Intravenous New Bag/Given 07/28/17 1959)  potassium chloride 10 mEq in 100 mL IVPB (10 mEq Intravenous New Bag/Given 07/28/17 1932)  dextrose 5 %-0.45 % sodium chloride infusion ( Intravenous Hold 07/28/17 1933)  ondansetron (ZOFRAN) injection 4 mg (4 mg Intravenous Given 07/28/17 1708)  LORazepam (ATIVAN) injection 1 mg (1 mg Intravenous Given 07/28/17 1708)  HYDROmorphone (DILAUDID) injection 1 mg (1 mg Intravenous Given 07/28/17 1710)  iopamidol (ISOVUE-300) 61 % injection (100 mLs  Contrast Given 07/28/17 1833)  LORazepam (ATIVAN) injection 1 mg (1 mg Intravenous Given 07/28/17 1816)  ranitidine (ZANTAC) 150 MG/10ML syrup 150 mg (150 mg Oral Given 07/28/17 1942)  gi cocktail (Maalox,Lidocaine,Donnatal) (30 mLs Oral Given  07/28/17 1942)  ketorolac (TORADOL) 30 MG/ML injection 30 mg (30 mg Intravenous Given 07/28/17 1958)     Initial Impression / Assessment and Plan / ED Course  I have reviewed the triage vital signs and the  nursing notes.  Pertinent labs & imaging results that were available during my care of the patient were reviewed by me and considered in my medical decision making (see chart for details).     Patient is a 42 year old male with history of alcoholic pink otitis, alcohol abuse, depression, type 2 diabetes, hyperlipidemia, hypertension who presents with 1 day of severe abdominal pain, nausea, vomiting.  States his last alcohol drink was on Saturday.  He has had alcohol withdrawal before with shaking symptoms.  He states he has never had an alcohol withdrawal seizure.  On exam patient has an moderate distress, tachycardic, shaky, tachypneic.  Lung sounds are clear.  He has diffuse abdominal tenderness most significant in the epigastrium.  He has localized guarding in the epigastrium.  He is dry heaving on arrival.  Blood glucose 251 prior to arrival.  Presentation consistent with DKA and possible acute on chronic pancreatitis.  Abdominal CT obtained which did not show any acute pancreatitis but that showed chronic calcific pancreatitis.  No other acute findings on CT.  Labs consistent with mild DKA.  His blood glucose is 342, bicarb 16, anion gap of 23.  He has large ketones in his urine and a beta hydroxybutyrate of 4.7.  His venous blood gas x-ray showed pH of 7.36.  Patient given 2 L of IV fluids and started on maintenance fluids.  Insulin drip was started.  So given 2 mg of Ativan while in the ED for likely alcohol withdrawal symptoms.  Patient will need a stepdown unit bed for his DKA as well as monitoring of alcohol withdrawal.    Final Clinical Impressions(s) / ED Diagnoses   Final diagnoses:  Diabetic ketoacidosis without coma associated with type 2 diabetes mellitus ([place])   Non-intractable vomiting with nausea, unspecified vomiting type    ED Discharge Orders    None       Tobie Poet, DO 07/28/17 2008    Macarthur Critchley, MD 07/31/17 [phone]

## 2017-07-29 ENCOUNTER — Other Ambulatory Visit: Payer: Self-pay

## 2017-07-29 DIAGNOSIS — Z87891 Personal history of nicotine dependence: Secondary | ICD-10-CM

## 2017-07-29 DIAGNOSIS — F1094 Alcohol use, unspecified with alcohol-induced mood disorder: Secondary | ICD-10-CM

## 2017-07-29 DIAGNOSIS — F10230 Alcohol dependence with withdrawal, uncomplicated: Secondary | ICD-10-CM

## 2017-07-29 DIAGNOSIS — E1310 Other specified diabetes mellitus with ketoacidosis without coma: Secondary | ICD-10-CM

## 2017-07-29 DIAGNOSIS — F1010 Alcohol abuse, uncomplicated: Secondary | ICD-10-CM

## 2017-07-29 DIAGNOSIS — G4700 Insomnia, unspecified: Secondary | ICD-10-CM

## 2017-07-29 DIAGNOSIS — I10 Essential (primary) hypertension: Secondary | ICD-10-CM

## 2017-07-29 LAB — BASIC METABOLIC PANEL
ANION GAP: 13 (ref 5–15)
ANION GAP: 14 (ref 5–15)
ANION GAP: 10 (ref 5–15)
BUN: 7 mg/dL (ref 6–20)
BUN: 5 mg/dL — ABNORMAL LOW (ref 6–20)
BUN: <5 mg/dL — ABNORMAL LOW (ref 6–20)
CO2: 20 mmol/L — AB (ref 22–32)
CO2: 20 mmol/L — AB (ref 22–32)
CO2: 22 mmol/L (ref 22–32)
Calcium: 7.9 mg/dL — ABNORMAL LOW (ref 8.9–10.3)
Calcium: 8.3 mg/dL — ABNORMAL LOW (ref 8.9–10.3)
Calcium: 8.4 mg/dL — ABNORMAL LOW (ref 8.9–10.3)
Chloride: 99 mmol/L — ABNORMAL LOW (ref 101–111)
Chloride: 98 mmol/L — ABNORMAL LOW (ref 101–111)
Chloride: 101 mmol/L (ref 101–111)
Creatinine, Ser: 0.97 mg/dL (ref 0.61–1.24)
Creatinine, Ser: 0.83 mg/dL (ref 0.61–1.24)
Creatinine, Ser: 0.77 mg/dL (ref 0.61–1.24)
GFR calc Af Amer: >60 mL/min (ref >60)
GFR calc Af Amer: >60 mL/min (ref >60)
GFR calc Af Amer: >60 mL/min (ref >60)
GLUCOSE: 184 mg/dL — AB (ref 65–99)
GLUCOSE: 165 mg/dL — AB (ref 65–99)
GLUCOSE: 87 mg/dL (ref 65–99)
POTASSIUM: 3.3 mmol/L — AB (ref 3.5–5.1)
POTASSIUM: 3.3 mmol/L — AB (ref 3.5–5.1)
Potassium: 4.3 mmol/L (ref 3.5–5.1)
Sodium: 132 mmol/L — ABNORMAL LOW (ref 135–145)
Sodium: 132 mmol/L — ABNORMAL LOW (ref 135–145)
Sodium: 133 mmol/L — ABNORMAL LOW (ref 135–145)

## 2017-07-29 LAB — CBG MONITORING, ED
GLUCOSE-CAPILLARY: 203 mg/dL — AB (ref 65–99)
GLUCOSE-CAPILLARY: 163 mg/dL — AB (ref 65–99)
GLUCOSE-CAPILLARY: 86 mg/dL (ref 65–99)
GLUCOSE-CAPILLARY: 127 mg/dL — AB (ref 65–99)
Glucose-Capillary: 136 mg/dL — ABNORMAL HIGH (ref 65–99)
Glucose-Capillary: 146 mg/dL — ABNORMAL HIGH (ref 65–99)
Glucose-Capillary: 165 mg/dL — ABNORMAL HIGH (ref 65–99)
Glucose-Capillary: 169 mg/dL — ABNORMAL HIGH (ref 65–99)
Glucose-Capillary: 171 mg/dL — ABNORMAL HIGH (ref 65–99)

## 2017-07-29 LAB — GLUCOSE, CAPILLARY
GLUCOSE-CAPILLARY: 185 mg/dL — AB (ref 65–99)
GLUCOSE-CAPILLARY: 140 mg/dL — AB (ref 65–99)
Glucose-Capillary: 204 mg/dL — ABNORMAL HIGH (ref 65–99)

## 2017-07-29 MED ORDER — INSULIN ASPART 100 UNIT/ML ~~LOC~~ SOLN
0.0000 [IU] | Freq: Three times a day (TID) | SUBCUTANEOUS | Status: DC
  Administered 2017-07-29: 3 [IU] via SUBCUTANEOUS
  Administered 2017-07-29: 5 [IU] via SUBCUTANEOUS
  Administered 2017-07-30: 8 [IU] via SUBCUTANEOUS
  Administered 2017-07-30: 3 [IU] via SUBCUTANEOUS

## 2017-07-29 MED ORDER — DEXTROSE-NACL 5-0.45 % IV SOLN: INTRAVENOUS | Status: DC

## 2017-07-29 MED ORDER — INSULIN GLARGINE 100 UNIT/ML ~~LOC~~ SOLN
10.0000 [IU] | Freq: Every day | SUBCUTANEOUS | Status: DC
  Administered 2017-07-29 – 2017-07-30 (×2): 10 [IU] via SUBCUTANEOUS
  Filled 2017-07-29 (×2): qty 0.1

## 2017-07-29 MED ORDER — INSULIN ASPART 100 UNIT/ML ~~LOC~~ SOLN
0.0000 [IU] | Freq: Every day | SUBCUTANEOUS | Status: DC
  Administered 2017-07-29: 2 [IU] via SUBCUTANEOUS

## 2017-07-29 MED ORDER — POTASSIUM CHLORIDE CRYS ER 20 MEQ PO TBCR
40.0000 meq | EXTENDED_RELEASE_TABLET | ORAL | Status: AC
  Administered 2017-07-29 (×2): 40 meq via ORAL
  Filled 2017-07-29 (×2): qty 2

## 2017-07-29 NOTE — ED Notes (Signed)
Patient up to bathroom

## 2017-07-29 NOTE — ED Triage Notes (Signed)
ADM. MD at bed side and Insuline gtt. Stopped. Order for lantus and IV rate dec. [place]

## 2017-07-29 NOTE — Consult Note (Addendum)
[facility] Consult   Reason for Consult:  Severe depression causing alcohol abuse Referring Physician:  Dr. Starla Link Patient Identification: Willie [name] MRN:  [id number] Principal Diagnosis: Alcohol-induced mood disorder [facility]) Diagnosis:   Patient Active Problem List   Diagnosis Date Noted  . DKA (diabetic ketoacidoses) ([place]) [E13.10] 07/28/2017  . Alcohol withdrawal ([place]) [F10.239] 07/28/2017  . Abnormal CT scan, colon [R93.3]   . 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 Lower GI bleed [K92.2] 07/04/2017  . Transaminitis [R74.0] 07/04/2017  . Pain [R52]   . Bleeding internal hemorrhoids [K64.8]   . Anemia of chronic disease [D63.8]   . Left arm weakness [R29.898]   . Anemia, blood loss [D50.0]   . Vomiting alone [R11.11] 03/27/2017  . Chest pain [R07.9] 03/27/2017  . Nausea and vomiting [R11.2]   . Lactic acidosis [E87.2] 03/26/2017  . History of alcohol abuse [Z87.898] 06/25/2016  . Tobacco abuse [Z72.0] 06/25/2016  . Anxiety state [F41.1] 11/20/2015  . Dental infection [K04.7] 07/10/2015  . Mood disorder ([place]) [F39] 04/18/2015  . Eczema [L30.9] 06/27/2014  . GERD (gastroesophageal reflux disease) [K21.9] 05/24/2014  . Snoring [R06.83] 04/07/2013  . Suicide attempt by substance overdose ([place]) [T65.92XA] 02/10/2013  . Alcohol abuse [F10.10] 12/31/2012  . Erectile dysfunction [N52.9] 12/09/2012  . Hematuria [R31.9] 06/11/2012  . Vision problem [H54.7] 02/02/2012  . Insomnia [G47.00] 12/20/2011  . Chronic pancreatitis ([place]) [K86.1] 02/05/2011  . Depression with anxiety [F41.8] 11/13/2010  . Hyperlipemia [E78.5] 06/27/2010  . Diabetes type 2, uncontrolled ([place]) [E11.65] 05/22/2010  . Essential hypertension [I10] 05/22/2010    Total Time spent with patient: 1 hour  Subjective:   Willie [name] is a 42 y.o. male patient admitted with DKA and alcohol withdrawal.  HPI:   Per chart review, patient was admitted with DKA and alcohol withdrawal.  He has a history of depression and is prescribed Lexapro 20 mg daily, Seroquel 300 mg qhs and Ativan 0.5 mg q 8 hours PRN. PMP indicates that he received a one time prescription of Ativan (#10) filled on 3/11. He has filled several prescriptions for Ambien and last filled on 3/6.   Of note, he was last seen by the psychiatry consult service in 12/2014 for SI in the setting of alcohol intoxication. He was discharged home with follow up with substance abuse treatment resources.  On interview, Willie [name] reports depression since separating from his wife in October 2018. He was married for 20 years.  He reports intermittent alcohol use since this time.  He reports having difficulty with knowing that his wife has a boyfriend.  He only speaks to her when he has to coordinate activities for their children.  He reports drinking up to 6 beers nightly.  He plans to stop drinking by spending more time with his family. He is not currently interested in rehab.  He quit his job in February because he was going to become a hourly employee and was previously receiving salary pay.  He denies financial stressors.  He reports poor sleep and poor appetite.  He reports poor medication compliance due to drinking alcohol.  He denies SI, HI or AVH.  He denies a history of manic symptoms (decreased need for sleep, increased energy or pressured speech).   Past Psychiatric History: Alcohol abuse, anxiety and depression. He reports onset of depression following the loss of several loved ones including his twin brother 8 years ago. He also reports the loss of a younger and older brother.  He has a living sister.   Risk to Self: Is patient at risk for suicide?: No Risk to Others:  None.  Denies HI. Prior Inpatient Therapy:  He was admitted to [facility] in 01/2013 for Ativan overdose in the setting of separation from his wife.  Prior Outpatient Therapy:  Prior medications include Cymbalta 60 mg daily, Seroquel 100 mg qhs, Xanax and Atarax.    Past Medical History:  Past Medical History:  Diagnosis Date  . Alcohol abuse   . Alcoholic pancreatitis    recurrent  . Depression   . Diabetes mellitus, type II ([place])    New Onset 03/2010  . GERD (gastroesophageal reflux disease)   . High cholesterol   . 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 Hypertension   . Sleep apnea    does not wear a CPAP    Past Surgical History:  Procedure Laterality Date  . COLONOSCOPY W/ BIOPSIES  09/2010   Dr. Ardis Hughs.  for intermittent rectal bleeding.  Mild sigmoid to descending diverticulosis.  Mild left colon erythema, benign biopsy, probably "prep effect"  . COLONOSCOPY WITH PROPOFOL N/A 07/06/2017   Procedure: COLONOSCOPY WITH PROPOFOL;  Surgeon: Jerene Bears, MD;  Location: [facility];  Service: Gastroenterology;  Laterality: N/A;  . LUMBAR MICRODISCECTOMY  ~ 2004   Dr Ellene Route   Family History:  Family History  Problem Relation Age of Onset  . Diabetes Mother   . Lung cancer Brother        twin brother  . Pancreatic cancer Paternal Aunt   . Colon cancer Neg Hx   . Stomach cancer Neg Hx    Family Psychiatric  History: Denies Social History:  Social History   Substance and Sexual Activity  Alcohol Use Yes  . Alcohol/week: 25.2 oz  . Types: 42 Cans of beer per week     Social History   Substance and Sexual Activity  Drug Use No   Comment: none in years    Social History   Socioeconomic History  . Marital status: Married    Spouse name: Not on file  . Number of children: 2  . Years of education: Not on file  . Highest education level: Not on file  Occupational History  . Occupation: Landscape architect: [employer]  . Financial resource strain: Not on file  . Food insecurity:    Worry: Not on file    Inability: Not on file  . Transportation needs:    Medical: Not on file    Non-medical: Not on file  Tobacco Use  .  Smoking status: Former Smoker    Packs/day: 0.25    Years: 1.00    Pack years: 0.25    Types: Cigarettes    Start date: 04/29/2016  . Smokeless tobacco: Never Used  . Tobacco comment: 4 Cigarettes a day   Substance and Sexual Activity  . Alcohol use: Yes    Alcohol/week: 25.2 oz    Types: 42 Cans of beer per week  . Drug use: No    Comment: none in years  . Sexual activity: Not on file  Lifestyle  . Physical activity:    Days per week: Not on file    Minutes per session: Not on file  . Stress: Not on file  Relationships  . Social connections:    Talks on phone: Not on file  Gets together: Not on file    Attends religious service: Not on file    Active member of club or organization: Not on file    Attends meetings of clubs or organizations: Not on file    Relationship status: Not on file  Other Topics Concern  . 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  He separated from his wife of 20 years in October 2018.  He has a 41 y/o and a 58 y/o daughter.  He is unemployed.  He previously worked as a Pharmacist, community.  He is attending school and majors in Theme park manager. He plans to complete his program in the fall.  He started drinking alcohol at 42 y/o per chart review. His longest period of sobriety was for 8 months. He was charged with a DUI in 2016.  He attended rehab at [facility] a year ago. He denies illicit substance use and reports rare tobacco use.    Allergies:   Allergies  Allergen Reactions  . Invokamet [Canagliflozin-Metformin Hcl] Other (See Comments)    Lactic acidosis  . Metformin And Related Other (See Comments)    DRASTIC drop in blood sugar    Labs:  Results for orders placed or performed during  the hospital encounter of 07/28/17 (from the past 48 hour(s))  CBG monitoring, ED     Status: Abnormal   Collection Time: 07/28/17  4:54 PM  Result Value Ref Range   Glucose-Capillary 390 (H) 65 - 99 mg/dL  CBC with Differential     Status: Abnormal   Collection Time: 07/28/17  5:34 PM  Result Value Ref Range   WBC 13.4 (H) 4.0 - 10.5 K/uL   RBC 4.52 4.22 - 5.81 MIL/uL   Hemoglobin 14.0 13.0 - 17.0 g/dL   HCT 39.9 39.0 - 52.0 %   MCV 88.3 78.0 - 100.0 fL   MCH 31.0 26.0 - 34.0 pg   MCHC 35.1 30.0 - 36.0 g/dL   RDW 13.5 11.5 - 15.5 %   Platelets 337 150 - 400 K/uL   Neutrophils Relative % 95 %   Neutro Abs 12.7 (H) 1.7 - 7.7 K/uL   Lymphocytes Relative 3 %   Lymphs Abs 0.4 (L) 0.7 - 4.0 K/uL   Monocytes Relative 2 %   Monocytes Absolute 0.2 0.1 - 1.0 K/uL   Eosinophils Relative 0 %   Eosinophils Absolute 0.0 0.0 - 0.7 K/uL   Basophils Relative 0 %   Basophils Absolute 0.0 0.0 - 0.1 K/uL    Comment: Performed at [facility], [address], [place], [place] [address]  Comprehensive metabolic panel     Status: Abnormal   Collection Time: 07/28/17  5:34 PM  Result Value Ref Range   Sodium 132 (L) 135 - 145 mmol/L   Potassium 4.3 3.5 - 5.1 mmol/L   Chloride 93 (L) 101 - 111 mmol/L   CO2 16 (L) 22 - 32 mmol/L   Glucose, Bld 342 (H) 65 - 99 mg/dL   BUN 9 6 - 20 mg/dL   Creatinine, Ser 1.14 0.61 - 1.24 mg/dL   Calcium 8.8 (L) 8.9 -  10.3 mg/dL   Total Protein 7.3 6.5 - 8.1 g/dL   Albumin 3.8 3.5 - 5.0 g/dL   AST 70 (H) 15 - 41 U/L   ALT 54 17 - 63 U/L   Alkaline Phosphatase 116 38 - 126 U/L   Total Bilirubin 2.3 (H) 0.3 - 1.2 mg/dL   GFR calc non Af Amer >60 >60 mL/min   GFR calc Af Amer >60 >60 mL/min    Comment: (NOTE) The eGFR has been calculated using the CKD EPI equation. This calculation has not been validated in all clinical situations. eGFR's persistently <60 mL/min signify possible Chronic Kidney Disease.    Anion gap 23 (H) 5 - 15    Comment: Performed at  [facility], [place] [address] [place], [place] [address]  Lipase, blood     Status: None   Collection Time: 07/28/17  5:34 PM  Result Value Ref Range   Lipase 16 11 - 51 U/L    Comment: Performed at [facility] [address] [place], [place] [address]  Beta-hydroxybutyric acid     Status: Abnormal   Collection Time: 07/28/17  5:34 PM  Result Value Ref Range   Beta-Hydroxybutyric Acid 4.72 (H) 0.05 - 0.27 mmol/L    Comment: RESULTS CONFIRMED BY MANUAL DILUTION Performed at [facility] [address] [place], [place] [address]   Ethanol     Status: None   Collection Time: 07/28/17  5:34 PM  Result Value Ref Range   Alcohol, Ethyl (B) <10 <10 mg/dL    Comment:        LOWEST DETECTABLE LIMIT FOR SERUM ALCOHOL IS 10 mg/dL FOR MEDICAL PURPOSES ONLY Performed at [facility], [place] [address], [place], [place] [address]   I-Stat Chem 8, ED     Status: Abnormal   Collection Time: 07/28/17  5:49 PM  Result Value Ref Range   Sodium 132 (L) 135 - 145 mmol/L   Potassium 4.4 3.5 - 5.1 mmol/L   Chloride 95 (L) 101 - 111 mmol/L   BUN 7 6 - 20 mg/dL   Creatinine, Ser 0.80 0.61 - 1.24 mg/dL   Glucose, Bld 347 (H) 65 - 99 mg/dL   Calcium, Ion 1.02 (L) 1.15 - 1.40 mmol/L   TCO2 21 (L) 22 - 32 mmol/L   Hemoglobin 15.3 13.0 - 17.0 g/dL   HCT 45.0 39.0 - 52.0 %  Urinalysis, Routine w reflex microscopic     Status: Abnormal   Collection Time: 07/28/17  5:56 PM  Result Value Ref Range   Color, Urine YELLOW YELLOW   APPearance CLEAR CLEAR   Specific Gravity, Urine 1.029 1.005 - 1.030   pH 5.0 5.0 - 8.0   Glucose, UA >=500 (A) NEGATIVE mg/dL   Hgb urine dipstick NEGATIVE NEGATIVE   Bilirubin Urine NEGATIVE NEGATIVE   Ketones, ur 80 (A) NEGATIVE mg/dL   Protein, ur NEGATIVE NEGATIVE mg/dL   Nitrite NEGATIVE NEGATIVE   Leukocytes, UA NEGATIVE NEGATIVE   RBC / HPF NONE SEEN 0 - 5 RBC/hpf   WBC, UA 0-5 0 - 5 WBC/hpf   Bacteria, UA NONE SEEN NONE SEEN   Squamous  Epithelial / LPF NONE SEEN NONE SEEN    Comment: Performed at [facility], [place] [address] [place], [place] [address]  Rapid urine drug screen (hospital performed)     Status: None   Collection Time: 07/28/17  5:56 PM  Result Value Ref Range  Opiates NONE DETECTED NONE DETECTED   Cocaine NONE DETECTED NONE DETECTED   Benzodiazepines NONE DETECTED NONE DETECTED   Amphetamines NONE DETECTED NONE DETECTED   Tetrahydrocannabinol NONE DETECTED NONE DETECTED   Barbiturates NONE DETECTED NONE DETECTED    Comment: (NOTE) DRUG SCREEN FOR MEDICAL PURPOSES ONLY.  IF CONFIRMATION IS NEEDED FOR ANY PURPOSE, NOTIFY LAB WITHIN 5 DAYS. LOWEST DETECTABLE LIMITS FOR URINE DRUG SCREEN Drug Class                     Cutoff (ng/mL) Amphetamine and metabolites    1000 Barbiturate and metabolites    200 Benzodiazepine                 017 Tricyclics and metabolites     300 Opiates and metabolites        300 Cocaine and metabolites        300 THC                            50 Performed at [facility], [place] [address], [place], [place] [address]   Basic metabolic panel     Status: Abnormal   Collection Time: 07/28/17  7:02 PM  Result Value Ref Range   Sodium 132 (L) 135 - 145 mmol/L   Potassium 4.2 3.5 - 5.1 mmol/L   Chloride 92 (L) 101 - 111 mmol/L   CO2 19 (L) 22 - 32 mmol/L   Glucose, Bld 300 (H) 65 - 99 mg/dL   BUN 8 6 - 20 mg/dL   Creatinine, Ser 1.07 0.61 - 1.24 mg/dL   Calcium 8.8 (L) 8.9 - 10.3 mg/dL   GFR calc non Af Amer >60 >60 mL/min   GFR calc Af Amer >60 >60 mL/min    Comment: (NOTE) The eGFR has been calculated using the CKD EPI equation. This calculation has not been validated in all clinical situations. eGFR's persistently <60 mL/min signify possible Chronic Kidney Disease.    Anion gap 21 (H) 5 - 15    Comment: Performed at [facility], [place] [address] [place], [place] [address]  I-Stat venous blood gas, ED     Status: Abnormal   Collection Time:  07/28/17  7:15 PM  Result Value Ref Range   pH, Ven 7.368 7.250 - 7.430   pCO2, Ven 38.7 (L) 44.0 - 60.0 mmHg   pO2, Ven 37.0 32.0 - 45.0 mmHg   Bicarbonate 22.3 20.0 - 28.0 mmol/L   TCO2 23 22 - 32 mmol/L   O2 Saturation 69.0 %   Acid-base deficit 3.0 (H) 0.0 - 2.0 mmol/L   Patient temperature HIDE    Sample type VENOUS    Comment NOTIFIED PHYSICIAN   POC CBG, ED     Status: Abnormal   Collection Time: 07/28/17  7:54 PM  Result Value Ref Range   Glucose-Capillary 263 (H) 65 - 99 mg/dL  CBG monitoring, ED     Status: Abnormal   Collection Time: 07/28/17  9:15 PM  Result Value Ref Range   Glucose-Capillary 204 (H) 65 - 99 mg/dL  Basic metabolic panel     Status: Abnormal   Collection Time: 07/28/17  9:17 PM  Result Value Ref Range   Sodium 135 135 - 145 mmol/L   Potassium 4.4 3.5 - 5.1 mmol/L   Chloride 99 (L) 101 - 111 mmol/L   CO2 20 (L) 22 - 32 mmol/L  Glucose, Bld 205 (H) 65 - 99 mg/dL   BUN 7 6 - 20 mg/dL   Creatinine, Ser 1.10 0.61 - 1.24 mg/dL   Calcium 8.0 (L) 8.9 - 10.3 mg/dL   GFR calc non Af Amer >60 >60 mL/min   GFR calc Af Amer >60 >60 mL/min    Comment: (NOTE) The eGFR has been calculated using the CKD EPI equation. This calculation has not been validated in all clinical situations. eGFR's persistently <60 mL/min signify possible Chronic Kidney Disease.    Anion gap 16 (H) 5 - 15    Comment: Performed at [facility], [place] [address] [place], [place] [address]  CBG monitoring, ED     Status: Abnormal   Collection Time: 07/28/17 10:18 PM  Result Value Ref Range   Glucose-Capillary 136 (H) 65 - 99 mg/dL  CBG monitoring, ED     Status: Abnormal   Collection Time: 07/28/17 11:26 PM  Result Value Ref Range   Glucose-Capillary 139 (H) 65 - 99 mg/dL  CBG monitoring, ED     Status: Abnormal   Collection Time: 07/29/17 12:34 AM  Result Value Ref Range   Glucose-Capillary 171 (H) 65 - 99 mg/dL  CBG monitoring, ED     Status: Abnormal   Collection Time:  07/29/17  1:32 AM  Result Value Ref Range   Glucose-Capillary 203 (H) 65 - 99 mg/dL  Basic metabolic panel     Status: Abnormal   Collection Time: 07/29/17  2:01 AM  Result Value Ref Range   Sodium 132 (L) 135 - 145 mmol/L   Potassium 4.3 3.5 - 5.1 mmol/L   Chloride 99 (L) 101 - 111 mmol/L   CO2 20 (L) 22 - 32 mmol/L   Glucose, Bld 184 (H) 65 - 99 mg/dL   BUN 7 6 - 20 mg/dL   Creatinine, Ser 0.97 0.61 - 1.24 mg/dL   Calcium 7.9 (L) 8.9 - 10.3 mg/dL   GFR calc non Af Amer >60 >60 mL/min   GFR calc Af Amer >60 >60 mL/min    Comment: (NOTE) The eGFR has been calculated using the CKD EPI equation. This calculation has not been validated in all clinical situations. eGFR's persistently <60 mL/min signify possible Chronic Kidney Disease.    Anion gap 13 5 - 15    Comment: Performed at [facility] [address], [place],  [address]  CBG monitoring, ED     Status: Abnormal   Collection Time: 07/29/17  2:59 AM  Result Value Ref Range   Glucose-Capillary 165 (H) 65 - 99 mg/dL  CBG monitoring, ED     Status: Abnormal   Collection Time: 07/29/17  4:07 AM  Result Value Ref Range   Glucose-Capillary 136 (H) 65 - 99 mg/dL  CBG monitoring, ED     Status: Abnormal   Collection Time: 07/29/17  5:17 AM  Result Value Ref Range   Glucose-Capillary 146 (H) 65 - 99 mg/dL  Basic metabolic panel     Status: Abnormal   Collection Time: 07/29/17  6:29 AM  Result Value Ref Range   Sodium 132 (L) 135 - 145 mmol/L   Potassium 3.3 (L) 3.5 - 5.1 mmol/L   Chloride 98 (L) 101 - 111 mmol/L   CO2 20 (L) 22 - 32 mmol/L   Glucose, Bld 165 (H) 65 - 99 mg/dL   BUN 5 (L) 6 - 20 mg/dL   Creatinine, Ser 0.83 0.61 - 1.24 mg/dL   Calcium 8.3 (  L) 8.9 - 10.3 mg/dL   GFR calc non Af Amer >60 >60 mL/min   GFR calc Af Amer >60 >60 mL/min    Comment: (NOTE) The eGFR has been calculated using the CKD EPI equation. This calculation has not been validated in all clinical situations. eGFR's persistently  <60 mL/min signify possible Chronic Kidney Disease.    Anion gap 14 5 - 15    Comment: Performed at [facility] [address] [place], [place] [address]  CBG monitoring, ED     Status: Abnormal   Collection Time: 07/29/17  6:43 AM  Result Value Ref Range   Glucose-Capillary 169 (H) 65 - 99 mg/dL  CBG monitoring, ED     Status: Abnormal   Collection Time: 07/29/17  7:53 AM  Result Value Ref Range   Glucose-Capillary 163 (H) 65 - 99 mg/dL  Basic metabolic panel     Status: Abnormal   Collection Time: 07/29/17  9:13 AM  Result Value Ref Range   Sodium 133 (L) 135 - 145 mmol/L   Potassium 3.3 (L) 3.5 - 5.1 mmol/L   Chloride 101 101 - 111 mmol/L   CO2 22 22 - 32 mmol/L   Glucose, Bld 87 65 - 99 mg/dL   BUN <5 (L) 6 - 20 mg/dL   Creatinine, Ser 0.77 0.61 - 1.24 mg/dL   Calcium 8.4 (L) 8.9 - 10.3 mg/dL   GFR calc non Af Amer >60 >60 mL/min   GFR calc Af Amer >60 >60 mL/min    Comment: (NOTE) The eGFR has been calculated using the CKD EPI equation. This calculation has not been validated in all clinical situations. eGFR's persistently <60 mL/min signify possible Chronic Kidney Disease.    Anion gap 10 5 - 15    Comment: Performed at [facility] [address] [place], [place] [address]  CBG monitoring, ED     Status: None   Collection Time: 07/29/17  9:13 AM  Result Value Ref Range   Glucose-Capillary 86 65 - 99 mg/dL  CBG monitoring, ED     Status: Abnormal   Collection Time: 07/29/17  9:58 AM  Result Value Ref Range   Glucose-Capillary 127 (H) 65 - 99 mg/dL   Comment 1 Notify RN    Comment 2 Document in Chart     Current Facility-Administered Medications  Medication Dose Route Frequency Provider Last Rate Last Dose  . amLODipine (NORVASC) tablet 10 mg  10 mg Oral Daily Derrill Kay A, MD   10 mg at 07/29/17 1034  . dextrose 5 %-0.45 % sodium chloride infusion   Intravenous Continuous Aline August, MD 75 mL/hr at 07/29/17 1102    . escitalopram  (LEXAPRO) tablet 20 mg  20 mg Oral Daily Derrill Kay A, MD   20 mg at 07/29/17 1035  . folic acid (FOLVITE) tablet 1 mg  1 mg Oral Daily Derrill Kay A, MD   1 mg at 07/29/17 1035  . insulin aspart (novoLOG) injection 0-15 Units  0-15 Units Subcutaneous TID WC Alekh, Kshitiz, MD      . insulin aspart (novoLOG) injection 0-5 Units  0-5 Units Subcutaneous QHS Alekh, Kshitiz, MD      . insulin glargine (LANTUS) injection 10 Units  10 Units Subcutaneous Daily Aline August, MD   10 Units at 07/29/17 1121  . LORazepam (ATIVAN) tablet 1 mg  1 mg Oral Q6H PRN Phillips Grout, MD       Or  . LORazepam (  ATIVAN) injection 1 mg  1 mg Intravenous Q6H PRN Phillips Grout, MD   1 mg at 07/29/17 9147  . multivitamin with minerals tablet 1 tablet  1 tablet Oral Daily Phillips Grout, MD   1 tablet at 07/29/17 1034  . potassium chloride SA (K-DUR,KLOR-CON) CR tablet 40 mEq  40 mEq Oral Q4H Alekh, Kshitiz, MD   40 mEq at 07/29/17 1034  . QUEtiapine (SEROQUEL) tablet 300 mg  300 mg Oral QHS Derrill Kay A, MD   300 mg at 07/28/17 2133  . thiamine (VITAMIN B-1) tablet 100 mg  100 mg Oral Daily Derrill Kay A, MD   100 mg at 07/29/17 1034   Or  . thiamine (B-1) injection 100 mg  100 mg Intravenous Daily Phillips Grout, MD       Current Outpatient Medications  Medication Sig Dispense Refill  . amLODipine (NORVASC) 10 MG tablet Take 1 tablet (10 mg total) by mouth daily. 90 tablet 1  . cloNIDine (CATAPRES) 0.1 MG tablet Take 1 tablet (0.1 mg total) by mouth 3 (three) times daily. 270 tablet 1  . escitalopram (LEXAPRO) 20 MG tablet Take 1 tablet (20 mg total) by mouth daily. 90 tablet 1  . hydrocortisone (ANUSOL-HC) 25 MG suppository Place 1 suppository (25 mg total) rectally 2 (two) times daily. 60 suppository 0  . insulin lispro (HUMALOG KWIKPEN) 100 UNIT/ML KiwkPen 3 times a day (just before each meal) 09-10-08 units, (Patient taking differently: Inject 5-15 Units into the skin See admin instructions. Inject  15 units into the skin with breakfast then 5 units with lunch then 15 units with dinner (evening meal)) 15 mL 4  . LANTUS SOLOSTAR 100 UNIT/ML Solostar Pen INJECT 40 UNITS INTO THE SKIN DAILY AT 10 PM. 15 mL 1  . lisinopril (PRINIVIL,ZESTRIL) 20 MG tablet TAKE 1 TABLET (20 MG TOTAL) BY MOUTH DAILY. (Patient taking differently: Take 20 mg by mouth daily. ) 90 tablet 1  . Multiple Vitamins-Minerals (MULTIVITAMIN) tablet Take 1 tablet by mouth daily. 30 tablet 0  . ondansetron (ZOFRAN ODT) 4 MG disintegrating tablet Take 1 tablet (4 mg total) by mouth every 6 (six) hours as needed for nausea or vomiting. 20 tablet 0  . pantoprazole (PROTONIX) 40 MG tablet Take 1 tablet (40 mg total) by mouth 2 (two) times daily. 60 tablet 1  . potassium chloride (K-DUR,KLOR-CON) 10 MEQ tablet Take 2 tablets (20 mEq total) by mouth daily. 180 tablet 1  . QUEtiapine (SEROQUEL) 300 MG tablet Take 1 tablet (300 mg total) by mouth at bedtime. 90 tablet 1  . thiamine 100 MG tablet Take 1 tablet (100 mg total) by mouth daily. 90 tablet 1  . Blood Glucose Monitoring Suppl (FREESTYLE FREEDOM LITE) w/Device KIT 2 (two) times daily 1 each 2  . glucose blood (FREESTYLE LITE) test strip Use as instructed 100 each 12  . Insulin Pen Needle (ULTICARE SHORT PEN NEEDLES) 31G X 8 MM MISC Use once daily to inject insulin. 100 each 1  . Lancets (FREESTYLE) lancets 2 (two) times daily 200 each 5  . LORazepam (ATIVAN) 0.5 MG tablet Take 1 tablet (0.5 mg total) by mouth every 8 (eight) hours as needed for anxiety. (Patient not taking: Reported on 07/28/2017) 10 tablet 0  . oxyCODONE (OXY IR/ROXICODONE) 5 MG immediate release tablet Take 1 tablet (5 mg total) by mouth every 6 (six) hours as needed for severe pain. (Patient not taking: Reported on 07/28/2017) 15 tablet 0    Musculoskeletal:  Strength & Muscle Tone: within normal limits Gait & Station: UTA due to patient lying in bed. Patient leans: N/A  Psychiatric Specialty Exam: Physical  Exam  Nursing note and vitals reviewed. Constitutional: He is oriented to person, place, and time. He appears well-developed and well-nourished.  HENT:  Head: Normocephalic and atraumatic.  Neck: Normal range of motion.  Respiratory: Effort normal.  Musculoskeletal: Normal range of motion.  Neurological: He is alert and oriented to person, place, and time.  Psychiatric: His speech is normal and behavior is normal. Judgment and thought content normal. Cognition and memory are normal. He exhibits a depressed mood.    Review of Systems  Constitutional: Positive for fever. Negative for chills.  Cardiovascular: Negative for chest pain.  Gastrointestinal: Negative for abdominal pain, constipation, diarrhea, nausea and vomiting.  Psychiatric/Behavioral: Positive for depression and substance abuse. Negative for hallucinations and suicidal ideas. The patient has insomnia. The patient is not nervous/anxious.   All other systems reviewed and are negative.   Blood pressure (!) 137/94, pulse 94, resp. rate 10, SpO2 98 %.There is no height or weight on file to calculate BMI.  General Appearance: Fairly Groomed, tall, middle aged, African American male with facial freckles and a scar on his forehead who is wearing a hospital gown and lying in bed. NAD.   Eye Contact:  Good  Speech:  Clear and Coherent and Normal Rate  Volume:  Normal  Mood:  Depressed  Affect:  Appropriate and Full Range  Thought Process:  Goal Directed, Linear and Descriptions of Associations: Intact  Orientation:  Full (Time, Place, and Person)  Thought Content:  Logical  Suicidal Thoughts:  No  Homicidal Thoughts:  No  Memory:  Immediate;   Good Recent;   Good Remote;   Good  Judgement:  Good  Insight:  Good  Psychomotor Activity:  Normal  Concentration:  Concentration: Good and Attention Span: Good  Recall:  Good  Fund of Knowledge:  Good  Language:  Good  Akathisia:  No  Handed:  Right  AIMS (if indicated):   N/A   Assets:  Communication Skills Desire for Improvement Financial Resources/Insurance Housing Social Support  ADL's:  Intact  Cognition:  WNL  Sleep:   Poor   Assessment:  Willie [name] is a 42 y.o. male who was admitted with alcohol withdrawal.  He reports problematic alcohol use for the past year after separating from his wife.  He endorses depressed mood and poor sleep.  He denies SI, HI or AVH.  He is agreeable to starting Gabapentin for alcohol cravings and will follow up with a outpatient psychiatrist for further medication management.  Treatment Plan Summary: -Continue Lexapro 20 mg daily and Seroquel 300 mg qhs for depression. -Recommend Ambien PRN for insomnia. Patient reports that Trazodone and Melatonin have been ineffective.  -Recommend starting Gabapentin 300 mg TID for alcohol cravings/wthdrawal and may be beneficial for mood stabilization.   -Patient declines adjusting his current medication regimen at this time for depression and reports poor medication compliance due to alcohol use. He stopped taking his medications for a week.  -Please have unit SW provide patient with outpatient resources to follow up with a psychiatrist and therapist.  Patient currently reports financial barriers with seeing his current mental health provider. -Patient is psychiatrically cleared.  Psychiatry will sign off on patient at this time.  Please consult psychiatry again as needed.  Disposition: No evidence of imminent risk to self or others at present.   Patient does  not meet criteria for psychiatric inpatient admission.  Faythe Dingwall, DO 07/29/2017 12:41 PM

## 2017-07-29 NOTE — Progress Notes (Signed)
Patient ID: Willie [name], male   DOB: 03-26-1976, 42 y.o.   MRN: [id number]  PROGRESS NOTE    Willie [name]  JME:[id number] DOB: 1975/12/17 DOA: 07/28/2017 PCP: Debbrah Alar, NP   Brief Narrative:  42 year old male with history of alcohol abuse, diabetes, GERD presented with nausea and vomiting after recent binge drinking.  He was found to be in DKA and started on insulin drip.  He also was admitted with alcohol withdrawal.   Assessment & Plan:   Principal Problem:   DKA (diabetic ketoacidoses) ([place]) Active Problems:   Essential hypertension   Chronic pancreatitis ([facility])   Alcohol abuse   Anemia of chronic disease   Alcohol withdrawal ([place])   DKA in a patient with diabetes mellitus type 2 -Probably secondary to noncompliance with meds recently. -Started on insulin drip initially which has already been stopped in the ED currently.  Anion gap is closed -Start carb modified diet. -Transition to long-acting Lantus subcutaneously along with Accu-Cheks with sliding scale coverage -Diabetes coordinator consult -D5 half-normal saline at 75 cc an hour and discontinue IV fluids if the patient tolerates diet -Transfer to MedSurg  Alcohol withdrawal in a patient with history of alcohol abuse -Monitor mental status.  Continue CIWA protocol.  Continue multivitamin, folic acid and thiamine -Social worker consult  Depression -Continue quetiapine and Seroquel.  Psych consult  Hypertension -Pressure on the higher side.  Monitor.  Continue amlodipine  Question of chronic pancreatitis -Stable.  Outpatient follow-up next  Hypokalemia -Replace.  Repeat labs in a.m. including magnesium.     DVT prophylaxis: SCDs Code Status: Full Family Communication: None at bedside Disposition Plan: Home in 1-2 days if clinically improved  Consultants: Will call psychiatry  Procedures: None  Antimicrobials: None   Subjective: Patient seen and examined at bedside.  He feels  slightly better and feels hungry.  No overnight fever or vomiting.  Objective: Vitals:   07/29/17 0600 07/29/17 0700 07/29/17 0758 07/29/17 0800  BP: (!) 140/101 (!) 135/92 (!) 144/94 (!) 144/94  Pulse: (!) 108 (!) 104 99 99  Resp: 16 16  12   SpO2: 99% 98%  100%    Intake/Output Summary (Last 24 hours) at 07/29/2017 1036 Last data filed at 07/29/2017 0819 Gross per 24 hour  Intake 4505 ml  Output -  Net 4505 ml   There were no vitals filed for this visit.  Examination:  General exam: Appears calm and comfortable; slightly tremulous.  No distress  respiratory system: Bilateral decreased breath sound at bases Cardiovascular system: S1 & S2 heard, rate controlled  gastrointestinal system: Abdomen is nondistended, soft and nontender. Normal bowel sounds heard. Central nervous system: Alert and oriented. No focal neurological deficits. Moving extremities. tremulous Extremities: No cyanosis, clubbing, edema  Skin: No rashes, lesions or ulcers Psychiatry: Flat affect    Data Reviewed: I have personally reviewed following labs and imaging studies  CBC: Recent Labs  Lab 07/28/17 1734 07/28/17 1749  WBC 13.4*  --   NEUTROABS 12.7*  --   HGB 14.0 15.3  HCT 39.9 45.0  MCV 88.3  --   PLT 337  --    Basic Metabolic Panel: Recent Labs  Lab 07/28/17 1902 07/28/17 2117 07/29/17 0201 07/29/17 0629 07/29/17 0913  NA 132* 135 132* 132* 133*  K 4.2 4.4 4.3 3.3* 3.3*  CL 92* 99* 99* 98* 101  CO2 19* 20* 20* 20* 22  GLUCOSE 300* 205* 184* 165* 87  BUN 8 7 7  5* <5*  CREATININE  1.07 1.10 0.97 0.83 0.77  CALCIUM 8.8* 8.0* 7.9* 8.3* 8.4*   GFR: CrCl cannot be calculated (Unknown ideal weight.). Liver Function Tests: Recent Labs  Lab 07/28/17 1734  AST 70*  ALT 54  ALKPHOS 116  BILITOT 2.3*  PROT 7.3  ALBUMIN 3.8   Recent Labs  Lab 07/28/17 1734  LIPASE 16   No results for input(s): AMMONIA in the last 168 hours. Coagulation Profile: No results for input(s): INR,  PROTIME in the last 168 hours. Cardiac Enzymes: No results for input(s): CKTOTAL, CKMB, CKMBINDEX, TROPONINI in the last 168 hours. BNP (last 3 results) No results for input(s): PROBNP in the last 8760 hours. HbA1C: No results for input(s): HGBA1C in the last 72 hours. CBG: Recent Labs  Lab 07/29/17 0517 07/29/17 0643 07/29/17 0753 07/29/17 0913 07/29/17 0958  GLUCAP 146* 169* 163* 86 127*   Lipid Profile: No results for input(s): CHOL, HDL, LDLCALC, TRIG, CHOLHDL, LDLDIRECT in the last 72 hours. Thyroid Function Tests: No results for input(s): TSH, T4TOTAL, FREET4, T3FREE, THYROIDAB in the last 72 hours. Anemia Panel: No results for input(s): VITAMINB12, FOLATE, FERRITIN, TIBC, IRON, RETICCTPCT in the last 72 hours. Sepsis Labs: No results for input(s): PROCALCITON, LATICACIDVEN in the last 168 hours.  No results found for this or any previous visit (from the past 240 hour(s)).       Radiology Studies: Ct Abdomen Pelvis W Contrast  Result Date: 07/28/2017 CLINICAL DATA:  Abdominal pain with nausea and vomiting beginning this morning, history of pancreatitis, alcohol abuse, diabetes mellitus, hypertension, former smoker, depression EXAM: CT ABDOMEN AND PELVIS WITH CONTRAST TECHNIQUE: Multidetector CT imaging of the abdomen and pelvis was performed using the standard protocol following bolus administration of intravenous contrast. Sagittal and coronal MPR images reconstructed from axial data set. CONTRAST:  120mL ISOVUE-300 IOPAMIDOL (ISOVUE-300) INJECTION 61% IV. No oral contrast. COMPARISON:  07/04/2017 FINDINGS: Lower chest: Lung bases clear Hepatobiliary: Diffuse fatty infiltration of liver. Small vague area of low attenuation in the LEFT lobe 6 mm diameter grossly stable. Gallbladder and liver otherwise unremarkable. Pancreas: Atrophic pancreas. Parenchymal calcifications at body/head consistent with chronic calcific pancreatitis. No obvious pancreatic mass. Spleen: Small spleen  without mass lesion. Small splenule at splenic hilum. Adrenals/Urinary Tract: Adrenal glands normal appearance. Kidneys, ureters, and bladder normal appearance Stomach/Bowel: Minimal sigmoid diverticulosis without evidence of diverticulitis. Few additional diverticula at descending colon. Normal appendix. Stomach distended by fluid. Remaining bowel loops normal appearance. Vascular/Lymphatic: Aorta normal caliber. Vascular structures grossly patent. No adenopathy. Reproductive: Unremarkable Other: Small umbilical and tiny LEFT inguinal hernias containing fat. No free intraperitoneal air or fluid. Musculoskeletal: Degenerative disc disease changes lumbar spine. IMPRESSION: Diffuse fatty infiltration of liver with stable tiny nonspecific low-attenuation focus. Chronic calcific pancreatitis. Distal colonic diverticulosis without evidence of diverticulitis. Small umbilical and tiny RIGHT inguinal hernias containing fat. No definite acute intra-abdominal or intrapelvic process identified. Electronically Signed   By: Lavonia Dana M.D.   On: 07/28/2017 18:59   Dg Chest Port 1 View  Result Date: 07/28/2017 CLINICAL DATA:  Abdominal pain EXAM: PORTABLE CHEST 1 VIEW COMPARISON:  06/07/2017 FINDINGS: Normal heart size. Lungs clear. No pneumothorax. No pleural effusion. IMPRESSION: No active cardiopulmonary disease. Electronically Signed   By: Marybelle Killings M.D.   On: 07/28/2017 20:13        Scheduled Meds: . amLODipine  10 mg Oral Daily  . escitalopram  20 mg Oral Daily  . folic acid  1 mg Oral Daily  . insulin aspart  0-15  Units Subcutaneous TID WC  . insulin aspart  0-5 Units Subcutaneous QHS  . insulin glargine  10 Units Subcutaneous Daily  . multivitamin with minerals  1 tablet Oral Daily  . potassium chloride  40 mEq Oral Q4H  . QUEtiapine  300 mg Oral QHS  . thiamine  100 mg Oral Daily   Or  . thiamine  100 mg Intravenous Daily   Continuous Infusions: . dextrose 5 % and 0.45% NaCl       LOS: 1  day        Aline August, MD [facility] Pager [phone]  If 7PM-7AM, please contact night-coverage [email or web address] Password [email or web address] 07/29/2017, 10:36 AM

## 2017-07-29 NOTE — ED Triage Notes (Signed)
PT reports itching ,nausea and Pt is obserfved go be restless. Ptfg pulled out IV in RT hand. Pt remolved BP cuff frfom arm and attempting to change arms .

## 2017-07-29 NOTE — Progress Notes (Signed)
Inpatient Diabetes Program Recommendations  AACE/ADA: New Consensus Statement on Inpatient Glycemic Control (2015)  Target Ranges:  Prepandial:   less than 140 mg/dL      Peak postprandial:   less than 180 mg/dL (1-2 hours)      Critically ill patients:  140 - 180 mg/dL   Spoke with patient about diabetes and home regimen for diabetes control. Patient reports that he is followed by Debbrah Alar, NP for diabetes management and currently takes Lantus 40 units, Humalog 15 units breakfast, 5 units lunch, 15 units supper. Patient reports "falling off the wagon for several week." Patient reports he is in school and is going to focus on himself and go to [employer].   Discussed A1c level of 8.4% on 06/08/17. We discussed diet modifications and exercise routines. Reviewed hypoglycemia s/s and treatment.  Discussed glucose and A1C goals. PAtient had questions about the Freestyle Libre Continuous glucose monitor as an outpatient. Patient verbalized understanding of information discussed and he states that he has no further questions at this time related to diabetes.   Thanks,  Tama Headings RN, MSN, [facility] Inpatient Diabetes Coordinator Team Pager [phone] (8a-5p)

## 2017-07-29 NOTE — Progress Notes (Signed)
Patient reports feeling sweaty and anxious requested prn ativan. Will continue to monitor. VS stable

## 2017-07-29 NOTE — Care Management Note (Signed)
Case Management Note  Patient Details  Name: PAMELA MADDY MRN: [id number] Date of Birth: Apr 25, 1976  Subjective/Objective:         READMISSION         42 year old male with history of alcohol abuse, diabetes, GERD presented with nausea and vomiting after recent binge drinking.  He was found to be in DKA and started on insulin drip.  He was recently admitted with alcohol withdrawal.  Action/Plan: Admit status INPATIENT (DKA); anticipate discharge HOME WITH SELF CARE.   Expected Discharge Date:  (unknown)               Expected Discharge Plan:  Home/Self Care  In-House Referral:  Clinical Social Work  Discharge planning Services  CM Consult   Status of Service:  In process, will continue to follow  If discussed at Long Length of Stay Meetings, dates discussed:    Additional Comments: PCP: Debbrah Alar, NP  Fuller Mandril, RN 07/29/2017, 11:42 AM

## 2017-07-30 ENCOUNTER — Telehealth: Payer: Self-pay | Admitting: Family

## 2017-07-30 DIAGNOSIS — E1110 Type 2 diabetes mellitus with ketoacidosis without coma: Principal | ICD-10-CM

## 2017-07-30 LAB — BASIC METABOLIC PANEL
ANION GAP: 11 (ref 5–15)
BUN: <5 mg/dL — ABNORMAL LOW (ref 6–20)
CALCIUM: 9.3 mg/dL (ref 8.9–10.3)
CO2: 24 mmol/L (ref 22–32)
Chloride: 100 mmol/L — ABNORMAL LOW (ref 101–111)
Creatinine, Ser: 0.76 mg/dL (ref 0.61–1.24)
GFR calc Af Amer: >60 mL/min (ref >60)
GFR calc non Af Amer: >60 mL/min (ref >60)
Glucose, Bld: 191 mg/dL — ABNORMAL HIGH (ref 65–99)
POTASSIUM: 3.5 mmol/L (ref 3.5–5.1)
Sodium: 135 mmol/L (ref 135–145)

## 2017-07-30 LAB — CBC WITH DIFFERENTIAL/PLATELET
BASOS ABS: 0 10*3/uL (ref 0.0–0.1)
BASOS PCT: 0 %
Eosinophils Absolute: 0.1 10*3/uL (ref 0.0–0.7)
Eosinophils Relative: 1 %
HEMATOCRIT: 42.6 % (ref 39.0–52.0)
HEMOGLOBIN: 14.7 g/dL (ref 13.0–17.0)
Lymphocytes Relative: 29 %
Lymphs Abs: 2 10*3/uL (ref 0.7–4.0)
MCH: 30.4 pg (ref 26.0–34.0)
MCHC: 34.5 g/dL (ref 30.0–36.0)
MCV: 88 fL (ref 78.0–100.0)
MONOS PCT: 11 %
Monocytes Absolute: 0.8 10*3/uL (ref 0.1–1.0)
NEUTROS ABS: 4.1 10*3/uL (ref 1.7–7.7)
NEUTROS PCT: 59 %
Platelets: 220 10*3/uL (ref 150–400)
RBC: 4.84 MIL/uL (ref 4.22–5.81)
RDW: 13.2 % (ref 11.5–15.5)
WBC: 6.9 10*3/uL (ref 4.0–10.5)

## 2017-07-30 LAB — MAGNESIUM: Magnesium: 2 mg/dL (ref 1.7–2.4)

## 2017-07-30 LAB — GLUCOSE, CAPILLARY
GLUCOSE-CAPILLARY: 184 mg/dL — AB (ref 65–99)
GLUCOSE-CAPILLARY: 273 mg/dL — AB (ref 65–99)

## 2017-07-30 LAB — RAPID STREP SCREEN (MED CTR MEBANE ONLY): STREPTOCOCCUS, GROUP A SCREEN (DIRECT): NEGATIVE

## 2017-07-30 MED ORDER — FOLIC ACID 1 MG PO TABS: 1.0000 mg | ORAL_TABLET | Freq: Every day | ORAL | 0 refills | Status: DC

## 2017-07-30 MED ORDER — GABAPENTIN 300 MG PO CAPS
300.0000 mg | ORAL_CAPSULE | Freq: Three times a day (TID) | ORAL | Status: DC
  Administered 2017-07-30: 300 mg via ORAL
  Filled 2017-07-30: qty 1

## 2017-07-30 MED ORDER — LORAZEPAM 0.5 MG PO TABS: 0.5000 mg | ORAL_TABLET | Freq: Three times a day (TID) | ORAL | 0 refills | Status: DC | PRN

## 2017-07-30 MED ORDER — ZOLPIDEM TARTRATE 5 MG PO TABS: 5.0000 mg | ORAL_TABLET | Freq: Every evening | ORAL | Status: DC | PRN

## 2017-07-30 MED ORDER — INSULIN GLARGINE 100 UNIT/ML SOLOSTAR PEN
20.0000 [IU] | PEN_INJECTOR | Freq: Every day | SUBCUTANEOUS | Status: DC
Start: 2017-07-30 — End: 2017-12-24

## 2017-07-30 MED ORDER — CONTINUOUS BLOOD GLUC SENSOR MISC: 1.0000 | 0 refills | Status: DC

## 2017-07-30 MED FILL — hydrOXYzine HCL 50 MG TABS: 50 | 90 days supply | Qty: 270 | Fill #1

## 2017-07-30 MED FILL — LORazepam 0.5 MG TABS: 0.5 | 4 days supply | Qty: 14 | Fill #0

## 2017-07-30 MED FILL — FOLIC ACID 1 MG TABS: 1 | 30 days supply | Qty: 30 | Fill #0

## 2017-07-30 MED FILL — ZOLPIDEM TARTRATE 5 MG TABL: 5 | 30 days supply | Qty: 30 | Fill #4

## 2017-07-30 NOTE — Telephone Encounter (Signed)
Pt has an appt scheduled for 08/06/17 @ 10:20. Does he need another scheduled as well?

## 2017-07-30 NOTE — Telephone Encounter (Signed)
Please contact pt to schedule a 1 week hospital follow up with me.  

## 2017-07-30 NOTE — Progress Notes (Signed)
CSW met with pt to provide outpatient therapy and outpatient rehab resources.  Pt was seeing a therapist but it became too expensive to keep up due to lack of insurance coverage.  CSW discussed local self-pay options and calling options ahead of time to have them confirm his copay- pt expressed understanding and motivation to get set up with consistent therapy.  No further questions or needs at this time  CSW signing off  Jorge Ny, [place] Social Worker [phone]

## 2017-07-30 NOTE — Telephone Encounter (Signed)
4/10 is fine. Thanks.

## 2017-07-30 NOTE — Progress Notes (Signed)
Inpatient Diabetes Program   Patient has signed and been given copy of consent for CGM/Freestyle [place] research study. MD also notified.  Education done regarding application and changing CGM sensor (alternate every 14 days on back of arms), 60 minute warm-up, use of glucometer/where to buy strips, how to scan CGM for glucose reading and information for PCP. Patient has been given [employer] reader and first sensor for use. Patient has also been given educational packet regarding use CGM sensor including the 1-800 toll free number for any questions, problems or needs related to the [facility] sensors or reader.  Patient given Rx. For sensors with prescriptions/discharge paper work.  Sensor applied by patient to (L) Arm at (1230 pm).  Explained that glucose readings will not be available until 60 minutes after application. Patient understands that Diabetes coordinator will call them 2 times after discharge (between days 7-12 after d/c from hospital and between days 30-35 after d/c from hospital). Patient verbalizes understanding of use of Freestyle Libre CGM and was told that any issues with blood sugars/diabetes will need to be addressed by PCP.  Diabetes Quality of Life Survey administered to patient.    Thanks,  Tama Headings RN, MSN, BC-ADM, [facility] Inpatient Diabetes Coordinator Team Pager [phone] (8a-5p)

## 2017-07-30 NOTE — Discharge Summary (Signed)
Physician Discharge Summary  Willie [name] MLY:[id number] DOB: 12-24-1975 DOA: 07/28/2017  PCP: Debbrah Alar, NP  Admit date: 07/28/2017 Discharge date: 07/30/2017  Admitted From: Home Disposition:  Home  Recommendations for Outpatient Follow-up:  1. Follow up with PCP in 1 week 2. Abstain from alcohol 3. Outpatient follow up with Psychiatry   Home Health: No  Equipment/Devices: None  Discharge Condition: Stable  CODE STATUS: Full  Diet recommendation: Heart Healthy / Carb Modified   Brief/Interim Summary: 42 year old male with history of alcohol abuse, diabetes, GERD presented with nausea and vomiting after recent binge drinking.  He was found to be in DKA and started on insulin drip.  He also was admitted with alcohol withdrawal. He was switched to long acting subcutaneous insulin. Diabetes coordinator was consulted. His symptoms have improved. Psychiatry has also evaluated the patient. He will be discharged home.   Discharge Diagnoses:  Principal Problem:   Alcohol-induced mood disorder ([place]) Active Problems:   Essential hypertension   Chronic pancreatitis (HCC)   Alcohol abuse   Anemia of chronic disease   DKA (diabetic ketoacidoses) (HCC)   Alcohol withdrawal ([place])  DKA in a patient with diabetes mellitus type 2 -Probably secondary to noncompliance with meds recently. -Started on insulin drip initially.  Anion gap closed -Transitioned to long-acting Lantus subcutaneously along with Accu-Cheks with sliding scale coverage -Diabetes coordinator consulting -tolerating carb modified diet. -Blood sugars much better -Discharge on Lantus 20units at bedtime along with short acting insulin with meals -outpatient follow up with PCP  Alcohol withdrawal in a patient with history of alcohol abuse -on CIWA protocol. Much improved - continue MVI, thiamine, folate -counseled about abstinence -social worker to provide resources for outpatient  followup  Depression -Continue quetiapine and Seroquel.  Psych consult appreciated. Psych recommends Gabapentin 374m TID. Will leave it to PCP or Psychiatry as an outpatient.  Hypertension -Pressure on the higher side.  Continue outpatient regimen  Question of chronic pancreatitis -Stable.  Outpatient follow-up next  Hypokalemia -Replaced.      Discharge Instructions  Discharge Instructions    Call MD for:  difficulty breathing, headache or visual disturbances   Complete by:  As directed    Call MD for:  extreme fatigue   Complete by:  As directed    Call MD for:  hives   Complete by:  As directed    Call MD for:  persistant dizziness or light-headedness   Complete by:  As directed    Call MD for:  persistant nausea and vomiting   Complete by:  As directed    Call MD for:  severe uncontrolled pain   Complete by:  As directed    Call MD for:  temperature >100.4   Complete by:  As directed    Diet - low sodium heart healthy   Complete by:  As directed    Increase activity slowly   Complete by:  As directed      Allergies as of 07/30/2017      Reactions   Invokamet [canagliflozin-metformin Hcl] Other (See Comments)   Lactic acidosis   Metformin And Related Other (See Comments)   DRASTIC drop in blood sugar      Medication List    STOP taking these medications   oxyCODONE 5 MG immediate release tablet Commonly known as:  Oxy IR/ROXICODONE   potassium chloride 10 MEQ tablet Commonly known as:  K-DUR,KLOR-CON     TAKE these medications   amLODipine 10 MG tablet Commonly known as:  NORVASC Take 1 tablet (10 mg total) by mouth daily.   cloNIDine 0.1 MG tablet Commonly known as:  CATAPRES Take 1 tablet (0.1 mg total) by mouth 3 (three) times daily.   Continuous Blood Gluc Sensor Misc 1 each by Does not apply route as directed. Use as directed every 14 days. May dispense FreeStyle [employer] or similar.   escitalopram 20 MG tablet Commonly  known as:  LEXAPRO Take 1 tablet (20 mg total) by mouth daily.   folic acid 1 MG tablet Commonly known as:  FOLVITE Take 1 tablet (1 mg total) by mouth daily. Start taking on:  07/31/2017   FREESTYLE FREEDOM LITE w/Device Kit 2 (two) times daily   freestyle lancets 2 (two) times daily   glucose blood test strip Commonly known as:  FREESTYLE LITE Use as instructed   hydrocortisone 25 MG suppository Commonly known as:  ANUSOL-HC Place 1 suppository (25 mg total) rectally 2 (two) times daily.   Insulin Glargine 100 UNIT/ML Solostar Pen Commonly known as:  LANTUS SOLOSTAR Inject 20 Units into the skin daily at 10 pm. What changed:  See the new instructions.   insulin lispro 100 UNIT/ML KiwkPen Commonly known as:  HUMALOG KWIKPEN 3 times a day (just before each meal) 09-10-08 units, What changed:    how much to take  how to take this  when to take this  additional instructions   Insulin Pen Needle 31G X 8 MM Misc Commonly known as:  ULTICARE SHORT PEN NEEDLES Use once daily to inject insulin.   lisinopril 20 MG tablet Commonly known as:  PRINIVIL,ZESTRIL TAKE 1 TABLET (20 MG TOTAL) BY MOUTH DAILY. What changed:    how much to take  how to take this  when to take this  additional instructions   LORazepam 0.5 MG tablet Commonly known as:  ATIVAN Take 1 tablet (0.5 mg total) by mouth every 8 (eight) hours as needed for anxiety.   multivitamin tablet Take 1 tablet by mouth daily.   ondansetron 4 MG disintegrating tablet Commonly known as:  ZOFRAN ODT Take 1 tablet (4 mg total) by mouth every 6 (six) hours as needed for nausea or vomiting.   pantoprazole 40 MG tablet Commonly known as:  PROTONIX Take 1 tablet (40 mg total) by mouth 2 (two) times daily.   QUEtiapine 300 MG tablet Commonly known as:  SEROQUEL Take 1 tablet (300 mg total) by mouth at bedtime.   thiamine 100 MG tablet Take 1 tablet (100 mg total) by mouth daily.      Follow-up  Information    Debbrah Alar, NP. Schedule an appointment as soon as possible for a visit in 1 week(s).   Specialty:  Internal Medicine Contact information: [address] [place] [address] [phone]        Psychiatrist. Schedule an appointment as soon as possible for a visit in 1 week(s).          Allergies  Allergen Reactions  . Invokamet [Canagliflozin-Metformin Hcl] Other (See Comments)    Lactic acidosis  . Metformin And Related Other (See Comments)    DRASTIC drop in blood sugar    Consultations:  Psychiatry   Procedures/Studies: Ct Abdomen Pelvis W Contrast  Result Date: 07/28/2017 CLINICAL DATA:  Abdominal pain with nausea and vomiting beginning this morning, history of pancreatitis, alcohol abuse, diabetes mellitus, hypertension, former smoker, depression EXAM: CT ABDOMEN AND PELVIS WITH CONTRAST TECHNIQUE: Multidetector CT imaging of the abdomen and  pelvis was performed using the standard protocol following bolus administration of intravenous contrast. Sagittal and coronal MPR images reconstructed from axial data set. CONTRAST:  140m ISOVUE-300 IOPAMIDOL (ISOVUE-300) INJECTION 61% IV. No oral contrast. COMPARISON:  07/04/2017 FINDINGS: Lower chest: Lung bases clear Hepatobiliary: Diffuse fatty infiltration of liver. Small vague area of low attenuation in the LEFT lobe 6 mm diameter grossly stable. Gallbladder and liver otherwise unremarkable. Pancreas: Atrophic pancreas. Parenchymal calcifications at body/head consistent with chronic calcific pancreatitis. No obvious pancreatic mass. Spleen: Small spleen without mass lesion. Small splenule at splenic hilum. Adrenals/Urinary Tract: Adrenal glands normal appearance. Kidneys, ureters, and bladder normal appearance Stomach/Bowel: Minimal sigmoid diverticulosis without evidence of diverticulitis. Few additional diverticula at descending colon. Normal appendix. Stomach distended by fluid. Remaining bowel  loops normal appearance. Vascular/Lymphatic: Aorta normal caliber. Vascular structures grossly patent. No adenopathy. Reproductive: Unremarkable Other: Small umbilical and tiny LEFT inguinal hernias containing fat. No free intraperitoneal air or fluid. Musculoskeletal: Degenerative disc disease changes lumbar spine. IMPRESSION: Diffuse fatty infiltration of liver with stable tiny nonspecific low-attenuation focus. Chronic calcific pancreatitis. Distal colonic diverticulosis without evidence of diverticulitis. Small umbilical and tiny RIGHT inguinal hernias containing fat. No definite acute intra-abdominal or intrapelvic process identified. Electronically Signed   By: MLavonia DanaM.D.   On: 07/28/2017 18:59   Ct Abdomen Pelvis W Contrast  Result Date: 07/04/2017 CLINICAL DATA:  Bright red rectal bleeding since this morning, history diabetes mellitus, hypertension, folic pancreatitis EXAM: CT ABDOMEN AND PELVIS WITH CONTRAST TECHNIQUE: Multidetector CT imaging of the abdomen and pelvis was performed using the standard protocol following bolus administration of intravenous contrast. Sagittal and coronal MPR images reconstructed from axial data set. CONTRAST:  1065mISOVUE-300 IOPAMIDOL (ISOVUE-300) INJECTION 61% IV. No oral contrast. COMPARISON:  03/26/2017 FINDINGS: Lower chest: Lung bases clear Hepatobiliary: Fatty infiltration of liver. Gallbladder and liver otherwise normal appearance Pancreas: Atrophic with mild dilatation of the pancreatic duct up to 6 mm diameter. No definite pancreatic mass. Few calcifications at pancreatic head/uncinate. Spleen: Normal appearance.  Small splenule. Adrenals/Urinary Tract: Adrenal glands normal appearance. Kidneys, ureters, and bladder normal appearance. Stomach/Bowel: Normal appendix. Wall thickening of a segment of the mid transverse colon question colitis versus mass. Minimal distal colonic diverticulosis without evidence of diverticulitis. Stomach and bowel loops  otherwise unremarkable for technique. Vascular/Lymphatic: Vascular structures unremarkable. No adenopathy. Reproductive: Unremarkable Other: No free air or free fluid. LEFT small LEFT inguinal hernia containing fat. Small umbilical hernia containing fat. Musculoskeletal: Normal appearance IMPRESSION: Segmental wall thickening of the mid transverse colon which could represent segmental colitis or mass; correlation with colonoscopy recommended. Minimal distal colonic diverticulosis without evidence of diverticulitis. Fatty infiltration of liver. 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 LEFT inguinal and umbilical hernias containing fat. Electronically Signed   By: MaLavonia Dana.D.   On: 07/04/2017 13:17   Dg Chest Port 1 View  Result Date: 07/28/2017 CLINICAL DATA:  Abdominal pain EXAM: PORTABLE CHEST 1 VIEW COMPARISON:  06/07/2017 FINDINGS: Normal heart size. Lungs clear. No pneumothorax. No pleural effusion. IMPRESSION: No active cardiopulmonary disease. Electronically Signed   By: ArMarybelle Killings.D.   On: 07/28/2017 20:13   Dg Abd 2 Views  Result Date: 07/07/2017 CLINICAL DATA:  Mid abdominal pain. History of colonoscopy and polypectomy on 07/06/2017. EXAM: ABDOMEN - 2 VIEW COMPARISON:  07/04/2017. FINDINGS: On the upright projection of the abdomen, no free intraperitoneal gas is observed. There is formed stool in the colon. No appreciable dilated bowel or significant abnormal  calcifications. IMPRESSION: 1. Formed stool is present in the colon. No dilated bowel or free intraperitoneal gas identified. If atypical pain persists despite conservative therapy, CT scan may be helpful. Electronically Signed   By: Van Clines M.D.   On: 07/07/2017 10:35       Subjective: Patient seen and examined at bedside.  He feels much better. Tolerating diet. Wants to go home.   Discharge Exam: Vitals:   07/30/17 0534 07/30/17 0729  BP: (!) 155/106 (!) 149/100  Pulse: 88  92  Resp: 16 18  Temp: 98.2 F (36.8 C) 97.6 F (36.4 C)  SpO2: 100% 100%   Vitals:   07/29/17 1700 07/30/17 0051 07/30/17 0534 07/30/17 0729  BP: (!) 143/109 (!) 131/93 (!) 155/106 (!) 149/100  Pulse: (!) 105 (!) 114 88 92  Resp: '12 18 16 18  ' Temp: 98.9 F (37.2 C) 98.1 F (36.7 C) 98.2 F (36.8 C) 97.6 F (36.4 C)  TempSrc: Oral Oral Oral Oral  SpO2: 100% 100% 100% 100%  Weight:      Height:        General: Pt is alert, awake, not in acute distress Cardiovascular: rate controlled, S1/S2 + Respiratory: bilateral decreased breath sounds at bases Abdominal: Soft, NT, ND, bowel sounds + Extremities: no edema, no cyanosis    The results of significant diagnostics from this hospitalization (including imaging, microbiology, ancillary and laboratory) are listed below for reference.     Microbiology: Recent Results (from the past 240 hour(s))  Rapid Strep Screen (Not at [facility])     Status: None   Collection Time: 07/30/17  9:48 AM  Result Value Ref Range Status   Streptococcus, Group A Screen (Direct) NEGATIVE NEGATIVE Final    Comment: (NOTE) A Rapid Antigen test may result negative if the antigen level in the sample is below the detection level of this test. The FDA has not cleared this test as a stand-alone test therefore the rapid antigen negative result has reflexed to a Group A Strep culture. Performed at [facility], [place] [address] [place], [place] [address]      Labs: BNP (last 3 results) No results for input(s): BNP in the last 8760 hours. Basic Metabolic Panel: Recent Labs  Lab 07/28/17 2117 07/29/17 0201 07/29/17 0629 07/29/17 0913 07/30/17 0458  NA 135 132* 132* 133* 135  K 4.4 4.3 3.3* 3.3* 3.5  CL 99* 99* 98* 101 100*  CO2 20* 20* 20* 22 24  GLUCOSE 205* 184* 165* 87 191*  BUN 7 7 5* <5* <5*  CREATININE 1.10 0.97 0.83 0.77 0.76  CALCIUM 8.0* 7.9* 8.3* 8.4* 9.3  MG  --   --   --   --  2.0   Liver Function Tests: Recent Labs  Lab  07/28/17 1734  AST 70*  ALT 54  ALKPHOS 116  BILITOT 2.3*  PROT 7.3  ALBUMIN 3.8   Recent Labs  Lab 07/28/17 1734  LIPASE 16   No results for input(s): AMMONIA in the last 168 hours. CBC: Recent Labs  Lab 07/28/17 1734 07/28/17 1749 07/30/17 0458  WBC 13.4*  --  6.9  NEUTROABS 12.7*  --  4.1  HGB 14.0 15.3 14.7  HCT 39.9 45.0 42.6  MCV 88.3  --  88.0  PLT 337  --  220   Cardiac Enzymes: No results for input(s): CKTOTAL, CKMB, CKMBINDEX, TROPONINI in the last 168 hours. BNP: Invalid input(s): POCBNP CBG: Recent Labs  Lab 07/29/17 0958 07/29/17 1359 07/29/17  1625 07/29/17 2130 07/30/17 0731  GLUCAP 127* 185* 204* 140* 184*   D-Dimer No results for input(s): DDIMER in the last 72 hours. Hgb A1c No results for input(s): HGBA1C in the last 72 hours. Lipid Profile No results for input(s): CHOL, HDL, LDLCALC, TRIG, CHOLHDL, LDLDIRECT in the last 72 hours. Thyroid function studies No results for input(s): TSH, T4TOTAL, T3FREE, THYROIDAB in the last 72 hours.  Invalid input(s): FREET3 Anemia work up No results for input(s): VITAMINB12, FOLATE, FERRITIN, TIBC, IRON, RETICCTPCT in the last 72 hours. Urinalysis    Component Value Date/Time   COLORURINE YELLOW 07/28/2017 1756   APPEARANCEUR CLEAR 07/28/2017 1756   LABSPEC 1.029 07/28/2017 1756   PHURINE 5.0 07/28/2017 1756   GLUCOSEU >=500 (A) 07/28/2017 1756   HGBUR NEGATIVE 07/28/2017 1756   BILIRUBINUR NEGATIVE 07/28/2017 1756   KETONESUR 80 (A) 07/28/2017 1756   PROTEINUR NEGATIVE 07/28/2017 1756   UROBILINOGEN 0.2 11/17/2014 1315   NITRITE NEGATIVE 07/28/2017 1756   LEUKOCYTESUR NEGATIVE 07/28/2017 1756   Sepsis Labs Invalid input(s): PROCALCITONIN,  WBC,  LACTICIDVEN Microbiology Recent Results (from the past 240 hour(s))  Rapid Strep Screen (Not at [facility])     Status: None   Collection Time: 07/30/17  9:48 AM  Result Value Ref Range Status   Streptococcus, Group A Screen (Direct) NEGATIVE NEGATIVE  Final    Comment: (NOTE) A Rapid Antigen test may result negative if the antigen level in the sample is below the detection level of this test. The FDA has not cleared this test as a stand-alone test therefore the rapid antigen negative result has reflexed to a Group A Strep culture. Performed at [facility], [place] [address] [place], [place] [address]      Time coordinating discharge: 35 minutes  SIGNED:   Aline August, MD  [facility] 07/30/2017, 11:00 AM Pager: [phone]  If 7PM-7AM, please contact night-coverage [email or web address] Password [email or web address]

## 2017-07-31 ENCOUNTER — Other Ambulatory Visit: Payer: Self-pay | Admitting: *Deleted

## 2017-07-31 NOTE — Patient Outreach (Signed)
[facility]) Care Management  07/31/2017  Willie [name] Oct 31, 1975 [id number]   Subjective: Telephone call to patient's home  / mobile number, no answer, left HIPAA compliant voicemail message, and requested call back.     Objective: Per KPN (Knowledge Performance Now, point of care tool) and chart review, patient hospitalized 07/28/17 -07/30/17 for DKA (diabetic ketoacidoses).   Patient also has a history of hypertension, alcohol abuse, sleep apnea (not on CPAP), and chronic pancreatitis.    Assessment: Received Focus Plan referral on 07/30/17.   Transition of care follow up pending patient contact.      Plan: RNCM will call patient for 2nd telephone outreach attempt, transition of care follow up, within 10 business days if no return call.       Kima Malenfant H. Annia Friendly, BSN, [place] Management [facility] Telephonic CM Phone: [phone] Fax: [phone]

## 2017-08-01 ENCOUNTER — Other Ambulatory Visit: Payer: Self-pay | Admitting: *Deleted

## 2017-08-01 ENCOUNTER — Ambulatory Visit: Payer: Self-pay | Admitting: *Deleted

## 2017-08-01 LAB — CULTURE, GROUP A STREP (THRC)

## 2017-08-01 NOTE — Patient Outreach (Signed)
[facility]) Care Management  08/01/2017  Willie [name] [birth year] [id number]    Subjective: Telephone call to patient's home  / mobile number, no answer, left HIPAA compliant voicemail message, and requested call back.     Objective: Per KPN (Knowledge Performance Now, point of care tool) and chart review, patient hospitalized 07/28/17 -07/30/17 for DKA (diabetic ketoacidoses).   Patient also has a history of hypertension, alcohol abuse, sleep apnea (not on CPAP), and chronic pancreatitis.    Assessment: Received Focus Plan referral on 07/30/17.   Transition of care follow up pending patient contact.      Plan: RNCM will send unsuccessful outreach  letter, [facility] pamphlet, will call patient for 3rd telephone outreach attempt, transition of care follow up, and proceed with case closure, within 10 business days if no return call.       Gabriel Paulding H. Annia Friendly, BSN, [place] Management [facility] Telephonic CM Phone: [phone] Fax: [phone]

## 2017-08-04 ENCOUNTER — Other Ambulatory Visit: Payer: Self-pay | Admitting: *Deleted

## 2017-08-04 ENCOUNTER — Ambulatory Visit: Payer: Self-pay | Admitting: *Deleted

## 2017-08-04 NOTE — Patient Outreach (Signed)
[name]) Care Management  08/04/2017  Willie [name] 12/09/1975 [id number]   Subjective:Telephone call to patient's home / mobile number, no answer, left HIPAA compliant voicemail message, and requested call back.     Objective:Per KPN (Knowledge Performance Now, point of care tool) and chart review,patient hospitalized 07/28/17 -07/30/17 forDKA (diabetic ketoacidoses). Patient also has a history of hypertension, alcohol abuse, sleep apnea (not on CPAP),and chronic pancreatitis.    Assessment: Received Focus Plan referral on 07/30/17.Transition of care follow up pending patient contact.     Plan:RNCM has sent unsuccessful outreach  letter, [facility] pamphlet,  and will proceed with case closure, within 10 business days if no return call.       Stclair Szymborski H. Annia Friendly, BSN, [place] Management [facility] Telephonic CM Phone: [phone] Fax: [phone]

## 2017-08-05 ENCOUNTER — Other Ambulatory Visit: Payer: Self-pay | Admitting: *Deleted

## 2017-08-05 NOTE — Patient Outreach (Signed)
[facility]) Care Management  08/05/2017  TAVI HOOGENDOORN [birth year] [id number]     Subjective: Received a voicemail message from Mr. Nolawi Kanady, states he is returning call, and requested call back.  Telephone call to patient's home  / mobile number, no answer, left HIPAA compliant voicemail message, and requested call back.     Objective:Per KPN (Knowledge Performance Now, point of care tool) and chart review,patient hospitalized 07/28/17 -07/30/17 forDKA (diabetic ketoacidoses). Patient also has a history of hypertension, alcohol abuse, sleep apnea (not on CPAP),and chronic pancreatitis.    Assessment: Received Focus Plan referral on 07/30/17.Transition of care follow up pending patient contact.     Plan:RNCM has sent unsuccessful outreach letter, [facility] pamphlet,  and will proceed with case closure, within 10 business days if no return call.       Jakeline Dave H. Annia Friendly, BSN, [place] Management [facility] Telephonic CM Phone: [phone] Fax: [phone]

## 2017-08-06 ENCOUNTER — Encounter: Payer: Self-pay | Admitting: Family

## 2017-08-06 ENCOUNTER — Ambulatory Visit (INDEPENDENT_AMBULATORY_CARE_PROVIDER_SITE_OTHER): Payer: No Typology Code available for payment source | Admitting: Family

## 2017-08-06 VITALS — BP 130/84 | HR 99 | Temp 97.6°F | Resp 16 | Ht 72.0 in | Wt 207.0 lb

## 2017-08-06 DIAGNOSIS — F419 Anxiety disorder, unspecified: Secondary | ICD-10-CM | POA: Diagnosis not present

## 2017-08-06 DIAGNOSIS — Z79899 Other long term (current) drug therapy: Secondary | ICD-10-CM

## 2017-08-06 DIAGNOSIS — E1165 Type 2 diabetes mellitus with hyperglycemia: Secondary | ICD-10-CM | POA: Diagnosis not present

## 2017-08-06 DIAGNOSIS — F32A Depression, unspecified: Secondary | ICD-10-CM

## 2017-08-06 DIAGNOSIS — F329 Major depressive disorder, single episode, unspecified: Secondary | ICD-10-CM | POA: Diagnosis not present

## 2017-08-06 DIAGNOSIS — F1010 Alcohol abuse, uncomplicated: Secondary | ICD-10-CM | POA: Diagnosis not present

## 2017-08-06 LAB — COMPREHENSIVE METABOLIC PANEL
ALBUMIN: 3.9 g/dL (ref 3.5–5.2)
ALT: 33 U/L (ref 0–53)
AST: 22 U/L (ref 0–37)
Alkaline Phosphatase: 71 U/L (ref 39–117)
BUN: 4 mg/dL — AB (ref 6–23)
CHLORIDE: 105 meq/L (ref 96–112)
CO2: 29 meq/L (ref 19–32)
CREATININE: 0.75 mg/dL (ref 0.40–1.50)
Calcium: 9.2 mg/dL (ref 8.4–10.5)
GFR: 147.74 mL/min (ref >60.00)
GLUCOSE: 77 mg/dL (ref 70–99)
POTASSIUM: 3.5 meq/L (ref 3.5–5.1)
SODIUM: 140 meq/L (ref 135–145)
Total Bilirubin: 0.4 mg/dL (ref 0.2–1.2)
Total Protein: 6.9 g/dL (ref 6.0–8.3)

## 2017-08-06 MED ORDER — FREESTYLE FREEDOM LITE W/DEVICE KIT
PACK | 2 refills | Status: DC
Start: 2017-08-06 — End: 2017-11-05

## 2017-08-06 MED ORDER — CONTINUOUS BLOOD GLUC SENSOR MISC
1.0000 | 0 refills | Status: DC
Start: 2017-08-06 — End: 2017-11-05

## 2017-08-06 MED ORDER — CONTINUOUS BLOOD GLUC SENSOR MISC
1.0000 | 0 refills | Status: DC
Start: 2017-08-06 — End: 2017-08-06

## 2017-08-06 MED ORDER — LORAZEPAM 0.5 MG PO TABS: 0.5000 mg | ORAL_TABLET | Freq: Three times a day (TID) | ORAL | 0 refills | Status: DC | PRN

## 2017-08-06 MED FILL — LORazepam 0.5 MG TABS: 0.5 | 10 days supply | Qty: 30 | Fill #0

## 2017-08-06 MED FILL — ULTICARE PEN NDL 8MM 31G: 31G X 8 MM | 90 days supply | Qty: 100 | Fill #1

## 2017-08-06 NOTE — Progress Notes (Signed)
Subjective:    Patient ID: Willie [name], male    DOB: [birth year], 42 y.o.   MRN: [id number]  HPI  Patient is a 42 year old male who presents today for hospital follow-up. Discharge summary is reviewed.  The patient presented with nausea and vomiting to the emergency department on July 28, 2017.  This occurred after an episode of binge drinking.  He was found to be in DKA and was started on insulin drip.  He was also admitted for alcohol withdrawal protocol.  Psychiatry was consulted during his admission.  He was transitioned from an insulin drip to long-acting Lantus.  He was advised to abstain from alcohol.  He was also continued on a multivitamin, thiamine and folate.  DM2-   He has a freestyle 14 day continuous monitor.  Reports that he is using sliding scale.  He is using lantus 20 units. Then he went up to 25 because his evening sugars have been high 250-300's.  Sugar was in the 300s this AM He took short acting insulin and did not eat so his sugar dropped to 65.  Has a target goal of 100-150.   Lab Results  Component Value Date   HGBA1C 8.4 (H) 06/08/2017   Depression-he was continued on Seroquel.  Psychiatry recommended addition of gabapentin. Reports that he has had some anxiety and has been using ativan prn.   Hypokalemia-potassium was repleted during his admission.  He is looking into outpatient alcohol treatment. Reports that he has not had any alcohol since returning home.   He has not seen psychiatry due to cost.  He is continuing ativan which is helping with his cravings.  Mood is stable.  Feels stressed due to not being employed.    Review of Systems See HPI  Past Medical History:  Diagnosis Date  . Alcohol abuse   . Alcoholic pancreatitis    recurrent  . Depression   . Diabetes mellitus, type II ([place])    New Onset 03/2010  . GERD (gastroesophageal reflux disease)   . High cholesterol   . History of low back pain    with herniated disc L5 S1 with right lumbar  radiculopathy  . Hypertension   . Sleep apnea    does not wear a CPAP     Social History   Socioeconomic History  . Marital status: Married    Spouse name: Not on file  . Number of children: 2  . Years of education: Not on file  . Highest education level: Not on file  Occupational History  . Occupation: Landscape architect: [employer]  . Financial resource strain: Not on file  . Food insecurity:    Worry: Not on file    Inability: Not on file  . Transportation needs:    Medical: Not on file    Non-medical: Not on file  Tobacco Use  . Smoking status: Former Smoker    Packs/day: 0.25    Years: 1.00    Pack years: 0.25    Types: Cigarettes    Start date: 04/29/2016  . Smokeless tobacco: Never Used  . Tobacco comment: 4 Cigarettes a day   Substance and Sexual Activity  . Alcohol use: Yes    Alcohol/week: 25.2 oz    Types: 42 Cans of beer per week  . Drug use: No    Comment: none in years  . Sexual activity: Not on file  Lifestyle  . Physical activity:  Days per week: Not on file    Minutes per session: Not on file  . Stress: Not on file  Relationships  . Social connections:    Talks on phone: Not on file    Gets together: Not on file    Attends religious service: Not on file    Active member of club or organization: Not on file    Attends meetings of clubs or organizations: Not on file    Relationship status: Not on file  . Intimate partner violence:    Fear of current or ex partner: Not on file    Emotionally abused: Not on file    Physically abused: Not on file    Forced sexual activity: Not on file  Other Topics Concern  . 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 COLONOSCOPY  W/ BIOPSIES  09/2010   Dr. Ardis Hughs.  for intermittent rectal bleeding.  Mild sigmoid to descending diverticulosis.  Mild left colon erythema, benign biopsy, probably "prep effect"  . COLONOSCOPY WITH PROPOFOL N/A 07/06/2017   Procedure: COLONOSCOPY WITH PROPOFOL;  Surgeon: Jerene Bears, MD;  Location: [facility];  Service: Gastroenterology;  Laterality: N/A;  . LUMBAR MICRODISCECTOMY  ~ 2004   Dr Ellene Route    Family History  Problem Relation Age of Onset  . Diabetes Mother   . Lung cancer Brother        twin brother  . Pancreatic cancer Paternal Aunt   . Colon cancer Neg Hx   . Stomach cancer Neg Hx     Allergies  Allergen Reactions  . Invokamet [Canagliflozin-Metformin Hcl] Other (See Comments)    Lactic acidosis  . Metformin And Related Other (See Comments)    DRASTIC drop in blood sugar    Current Outpatient Medications on File Prior to Visit  Medication Sig Dispense Refill  . amLODipine (NORVASC) 10 MG tablet Take 1 tablet (10 mg total) by mouth daily. 90 tablet 1  . Blood Glucose Monitoring Suppl (FREESTYLE FREEDOM LITE) w/Device KIT 2 (two) times daily 1 each 2  . cloNIDine (CATAPRES) 0.1 MG tablet Take 1 tablet (0.1 mg total) by mouth 3 (three) times daily. 270 tablet 1  . Continuous Blood Gluc Sensor MISC 1 each by Does not apply route as directed. Use as directed every 14 days. May dispense FreeStyle [employer] or similar. 2 each 0  . escitalopram (LEXAPRO) 20 MG tablet Take 1 tablet (20 mg total) by mouth daily. 90 tablet 1  . folic acid (FOLVITE) 1 MG tablet Take 1 tablet (1 mg total) by mouth daily. 30 tablet 0  . glucose blood (FREESTYLE LITE) test strip Use as instructed 100 each 12  . hydrocortisone (ANUSOL-HC) 25 MG suppository Place 1 suppository (25 mg total) rectally 2 (two) times daily. 60 suppository 0  . Insulin Glargine (LANTUS SOLOSTAR) 100 UNIT/ML Solostar Pen Inject 20 Units into the skin daily at 10 pm.    . insulin lispro (HUMALOG KWIKPEN) 100  UNIT/ML KiwkPen 3 times a day (just before each meal) 09-10-08 units, (Patient taking differently: Inject 5-15 Units into the skin See admin instructions. Inject 15 units into the skin  with breakfast then 5 units with lunch then 15 units with dinner (evening meal)) 15 mL 4  . Insulin Pen Needle (ULTICARE SHORT PEN NEEDLES) 31G X 8 MM MISC Use once daily to inject insulin. 100 each 1  . Lancets (FREESTYLE) lancets 2 (two) times daily 200 each 5  . lisinopril (PRINIVIL,ZESTRIL) 20 MG tablet TAKE 1 TABLET (20 MG TOTAL) BY MOUTH DAILY. (Patient taking differently: Take 20 mg by mouth daily. ) 90 tablet 1  . LORazepam (ATIVAN) 0.5 MG tablet Take 1 tablet (0.5 mg total) by mouth every 8 (eight) hours as needed for anxiety. 14 tablet 0  . Multiple Vitamins-Minerals (MULTIVITAMIN) tablet Take 1 tablet by mouth daily. 30 tablet 0  . ondansetron (ZOFRAN ODT) 4 MG disintegrating tablet Take 1 tablet (4 mg total) by mouth every 6 (six) hours as needed for nausea or vomiting. 20 tablet 0  . pantoprazole (PROTONIX) 40 MG tablet Take 1 tablet (40 mg total) by mouth 2 (two) times daily. 60 tablet 1  . QUEtiapine (SEROQUEL) 300 MG tablet Take 1 tablet (300 mg total) by mouth at bedtime. 90 tablet 1  . thiamine 100 MG tablet Take 1 tablet (100 mg total) by mouth daily. 90 tablet 1   No current facility-administered medications on file prior to visit.     BP 130/84 (BP Location: Right Arm, Patient Position: Sitting, Cuff Size: Small)   Pulse 99   Temp 97.6 F (36.4 C) (Oral)   Resp 16   Ht 6' (1.829 m)   Wt 207 lb (93.9 kg)   SpO2 100%   BMI 28.07 kg/m       Objective:   Physical Exam  Constitutional: He is oriented to person, place, and time. He appears well-developed and well-nourished. No distress.  HENT:  Head: Normocephalic and atraumatic.  Cardiovascular: Normal rate and regular rhythm.  No murmur heard. Pulmonary/Chest: Effort normal and breath sounds normal. No respiratory distress. He has no  wheezes. He has no rales.  Abdominal: Soft. Bowel sounds are normal. He exhibits no distension. There is no tenderness. There is no rebound.  Musculoskeletal: He exhibits no edema.  Neurological: He is alert and oriented to person, place, and time.  Fine resting hand tremor noted bilaterally  Skin: Skin is warm and dry.  Psychiatric: He has a normal mood and affect. His behavior is normal. Thought content normal.             Assessment & Plan:  Diabetes type 2-overall sugars have been stable since he has been home.  I did advise him  to make sure that he eats a snack or meal if he is going to take his short acting insulin.  Plan to continue Lantus.  He is advised to let me know if he has recurrent sugars under 80. He will be given juice here in the office due to low blood sugar reading. Will arrange follow up with endocrinology.  Obtain follow-up complete metabolic panel.  Alcohol abuse-we discussed importance of  Depression- this appears stable on Seroquel.  Unfortunately he has been unable to afford psychiatry.  We will continue Seroquel for now.  He is searching for a Company secretary.  Anxiety-this continues to be an issue.  He has been using Ativan as needed which seems to be helping to curb his alcohol cravings.  Will obtain a follow-up urine drug screen today and the patient will sign an updated controlled substance contract.  Review of the [facility] drug registry and  refills have been of appropriate.  Alcohol abuse-discussed importance of maintaining his sobriety.he seems motivated to remain off of alcohol.  He is exploring various outpatient alcohol treatment programs.      Assessment & Plan:

## 2017-08-06 NOTE — Patient Instructions (Signed)
Please complete lab work prior to leaving. Make sure to eat if you are going to take your short acting insulin. Remain off of alcohol completely. You should be contacted about your referral to Dr. Loanne Drilling for further management of your diabetes.

## 2017-08-08 MED FILL — FREESTYLE LIBRE 14 DAY SENS: 28 days supply | Qty: 2 | Fill #0

## 2017-08-09 LAB — PAIN MGMT, PROFILE 8 W/CONF, U
6 ACETYLMORPHINE: NEGATIVE ng/mL (ref <10)
ALCOHOL METABOLITES: NEGATIVE ng/mL (ref <500)
AMPHETAMINES: NEGATIVE ng/mL (ref <500)
Alphahydroxyalprazolam: NEGATIVE ng/mL (ref <25)
Alphahydroxymidazolam: NEGATIVE ng/mL (ref <50)
Alphahydroxytriazolam: NEGATIVE ng/mL (ref <50)
Aminoclonazepam: NEGATIVE ng/mL (ref <25)
Benzodiazepines: POSITIVE ng/mL — AB (ref <100)
Buprenorphine, Urine: NEGATIVE ng/mL (ref <5)
COCAINE METABOLITE: NEGATIVE ng/mL (ref <150)
CREATININE: 287 mg/dL
Hydroxyethylflurazepam: NEGATIVE ng/mL (ref <50)
LORAZEPAM: 927 ng/mL — AB (ref <50)
MDMA: NEGATIVE ng/mL (ref <500)
Marijuana Metabolite: NEGATIVE ng/mL (ref <20)
NORDIAZEPAM: NEGATIVE ng/mL (ref <50)
OPIATES: NEGATIVE ng/mL (ref <100)
OXIDANT: NEGATIVE ug/mL (ref <200)
Oxazepam: NEGATIVE ng/mL (ref <50)
Oxycodone: NEGATIVE ng/mL (ref <100)
PH: 6.4 (ref 4.5–9.0)
Temazepam: NEGATIVE ng/mL (ref <50)

## 2017-08-11 ENCOUNTER — Telehealth: Payer: Self-pay

## 2017-08-11 MED FILL — FREESTYLE LIBRE 14 DAY READ: 1 days supply | Qty: 1 | Fill #0

## 2017-08-11 NOTE — Telephone Encounter (Signed)
PA initiated via Covermymeds; KEY; [id number]. Received PA approval.   The request has been approved. The authorization is effective for a maximum of 12 fills from 08/08/2017 to 08/08/2018, as long as the member is enrolled in their current health plan. The request was approved as submitted. This request is approved for 2 sensors per 28 days. An additional prior authorization has been entered for [facility] 14 Day Reader for 1 reader per 12 months, effective 08/08/2017 through 08/08/2018; please reference authorization [id number]. A written notification letter will follow with additional details.

## 2017-08-15 ENCOUNTER — Telehealth: Payer: Self-pay

## 2017-08-15 IMAGING — CT CT ABD-PELV W/ CM
2 of 4 series · 15 of 46 positions shown, 17 images · IV contrast (APPLIED)
Comparison: CT 06/11/2013 and 03/22/2009

CLINICAL DATA: Rectal bleeding for 2 days. Nights sweats for 2
weeks.

EXAM:
CT ABDOMEN AND PELVIS WITH CONTRAST
TECHNIQUE: Multidetector CT imaging of the abdomen and pelvis was performed
using the standard protocol following bolus administration of
intravenous contrast.
CONTRAST:  100mL XO2ZDK-HYY IOPAMIDOL (XO2ZDK-HYY) INJECTION 61%

[Series 2: axial st · axial · 0.98mm/px · z∈[-552,-56]mm · 12 of 118 slices shown, 14 images]
[im 10/118  soft-tissue]
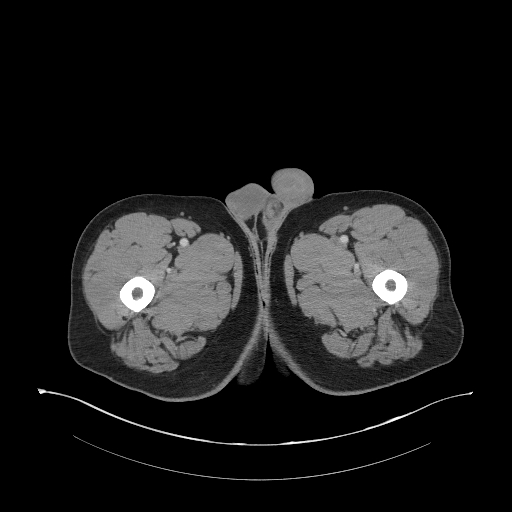
[im 10/118  bone]
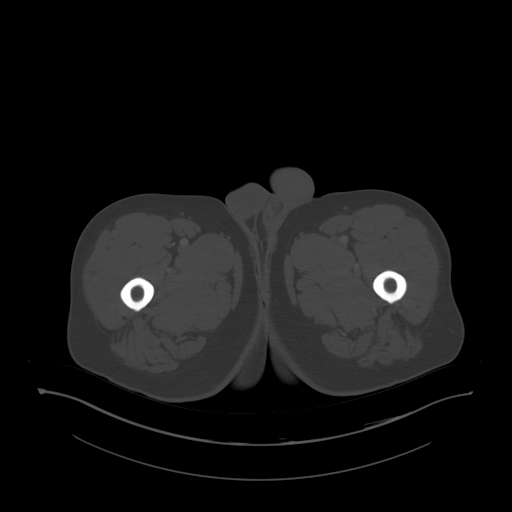
[im 19/118  soft-tissue]
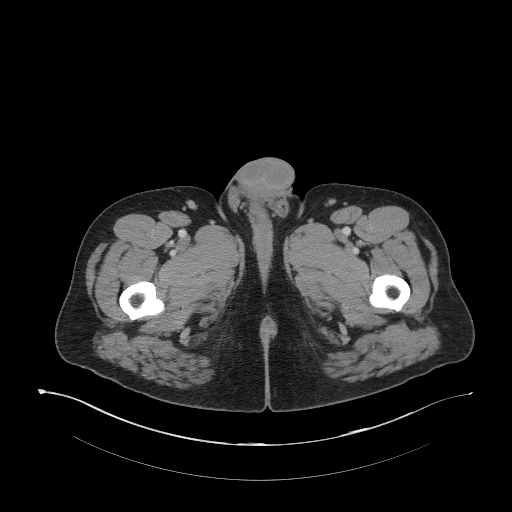
[im 28/118  soft-tissue]
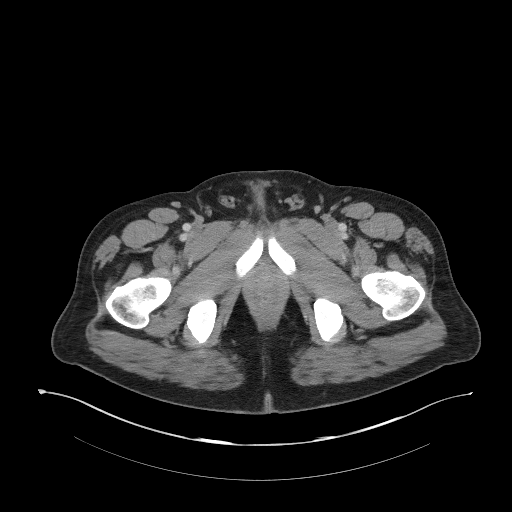
[im 37/118  soft-tissue]
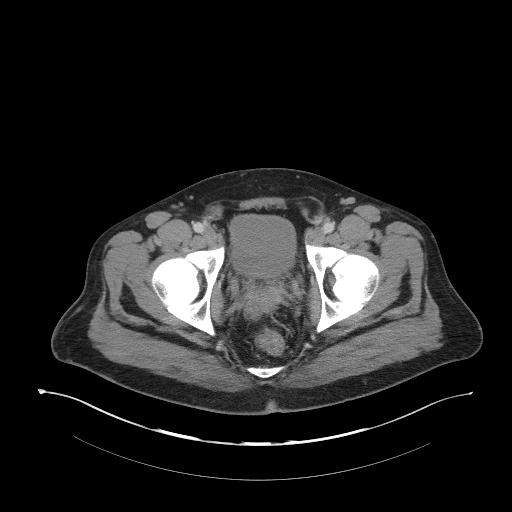
[im 46/118  soft-tissue]
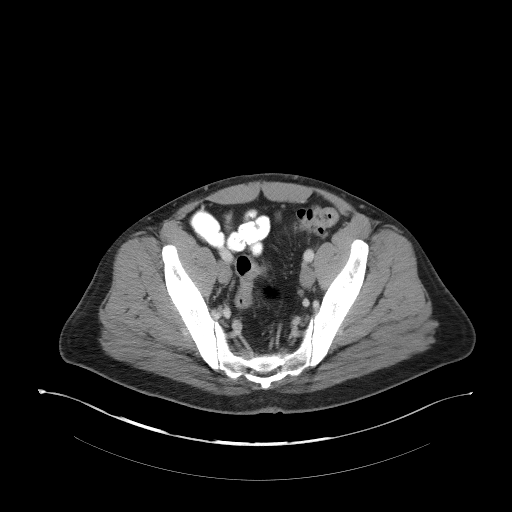
[im 55/118  soft-tissue]
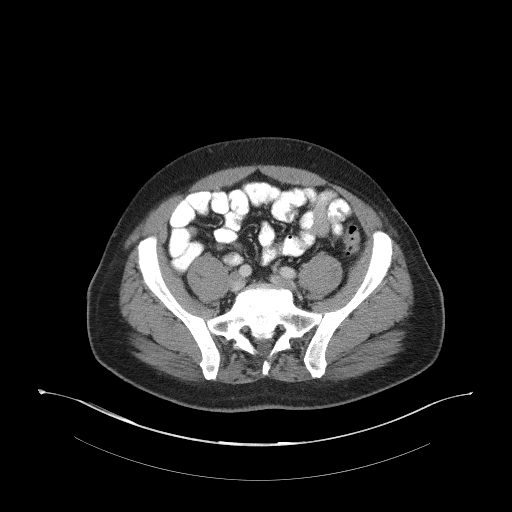
[im 64/118  soft-tissue]
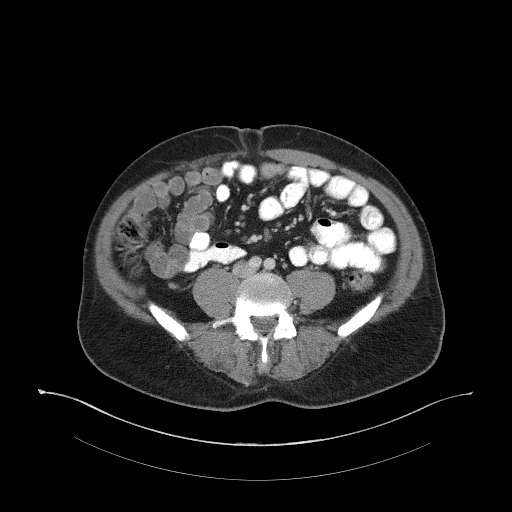
[im 73/118  soft-tissue]
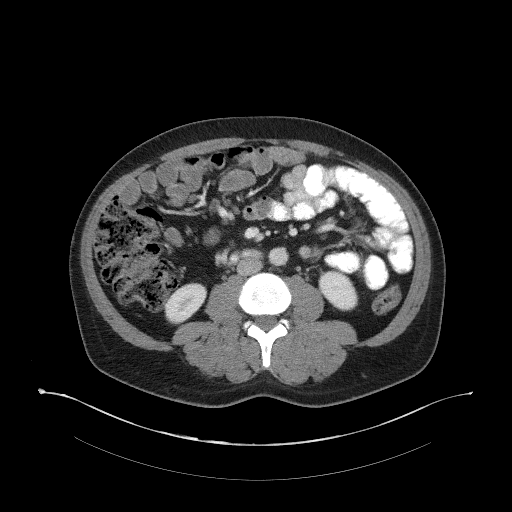
[im 82/118  soft-tissue]
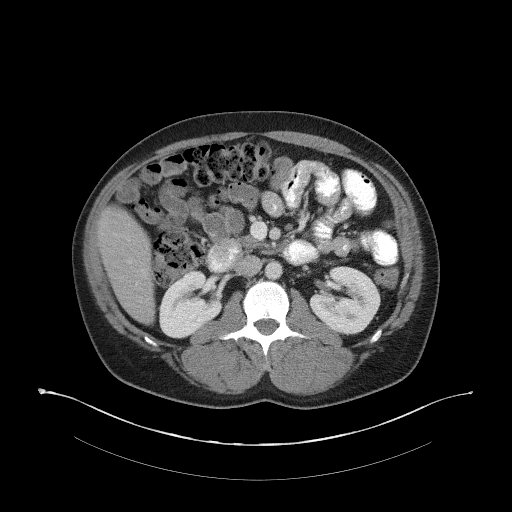
[im 82/118  bone]
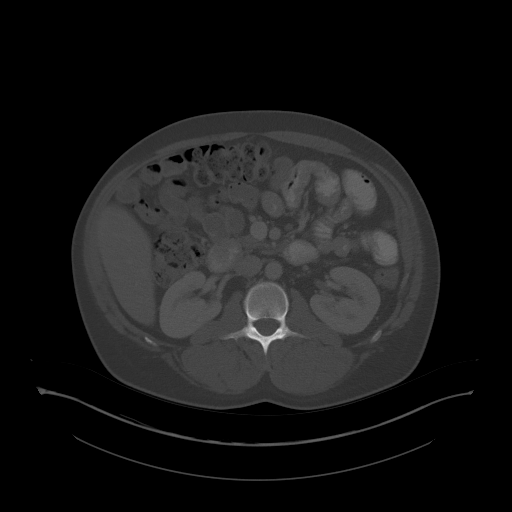
[im 91/118  soft-tissue]
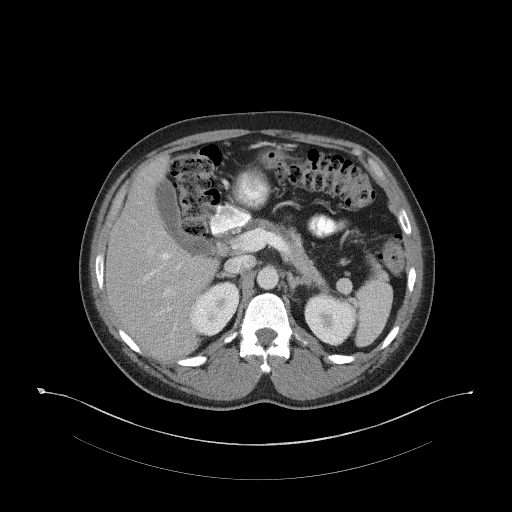
[im 100/118  soft-tissue]
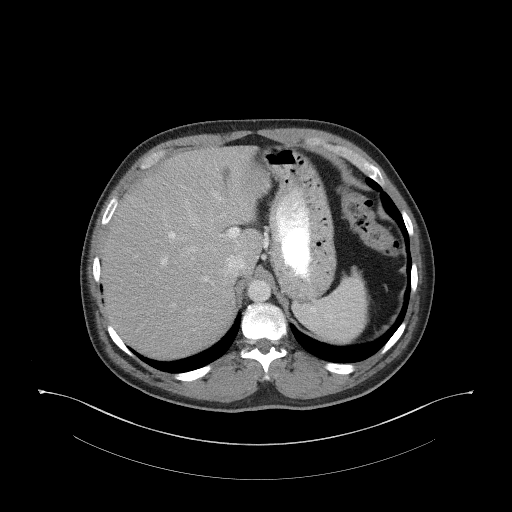
[im 109/118  soft-tissue]
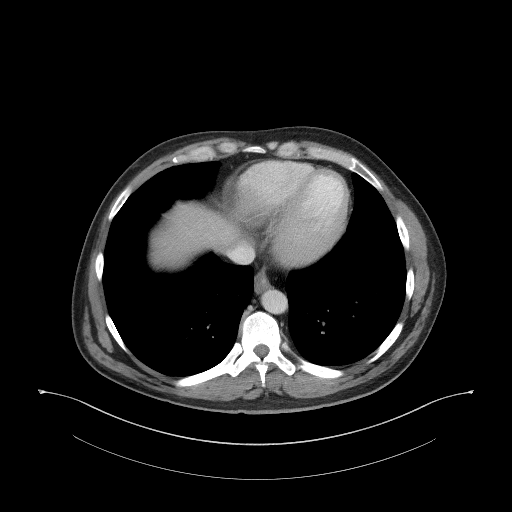

[Series 5: coronal st · coronal · 0.87mm/px · 3 of 107 slices shown]
[im 36/107  soft-tissue]
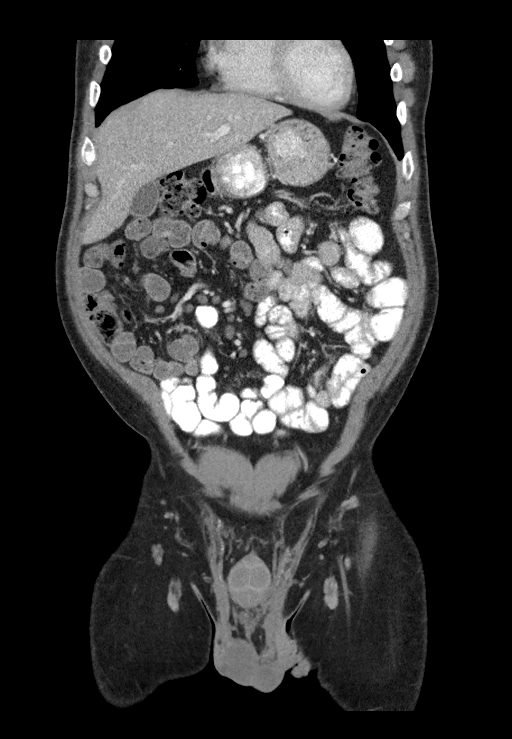
[im 48/107  soft-tissue]
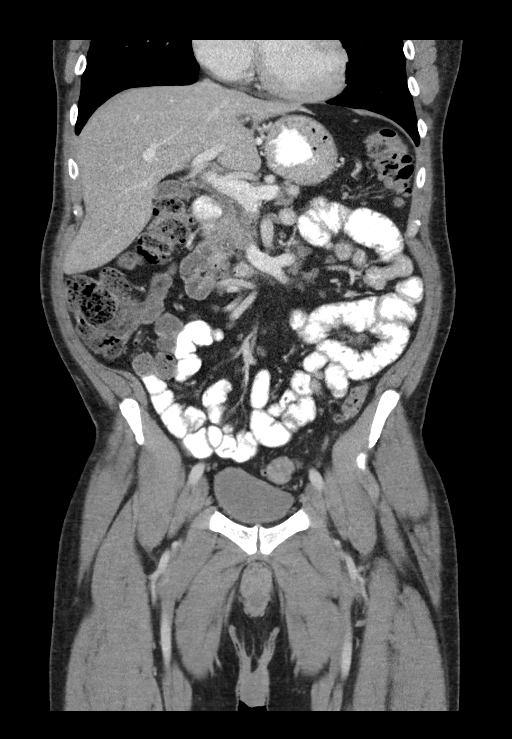
[im 59/107  soft-tissue]
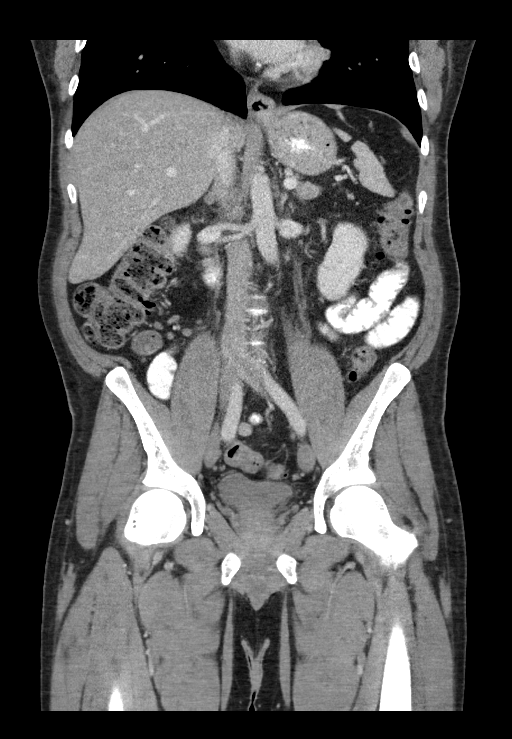

[15 of 46 positions shown; findings below may reference images not displayed]

FINDINGS: Lower chest: Lung bases are clear.  No pleural effusions.

Hepatobiliary: There is a subtle hypoechoic structure in the right
hepatic lobe on sequence 2, image 23. This measures up to 1.6 cm.
There may be a small amount of enhancement along the caudal aspect
of this lesion on the reformats. Review of multiple old CTs
demonstrates that there was a small lesion in this area on a CT from
03/22/2009. Size of lesion has not significantly changed and suspect
this represents a benign etiology such as a small cavernous
hemangioma. Punctate hypodensity in the left hepatic lobe probably
represents a cyst. Portal venous system is patent. Normal appearance
of the gallbladder.

Pancreas: There appears to be atrophy in the pancreatic body and
neck region. There is duct dilatation associated with the areas of
pancreatic atrophy. Findings most likely represent the sequelae of
prior pancreatitis.

Spleen: Normal appearance of spleen without enlargement.

Adrenals/Urinary Tract: Normal adrenal glands. Normal appearance of
both kidneys without hydronephrosis or suspicious renal lesions.
Urinary bladder is unremarkable.

Stomach/Bowel: Small hiatal hernia. Few colonic diverticula without
acute colonic inflammation. Negative for bowel dilatation or
obstruction. Large amount of stool in the right colon and cecum
region.

Vascular/Lymphatic: Slightly prominent lymph nodes in the abdominal
mesenteric are nonspecific and probably reactive. Otherwise, there
is no suspicious lymphadenopathy in the abdomen or pelvis. Normal
caliber of the abdominal aorta without any significant
atherosclerosis.

Reproductive: Prostate and seminal vesicles are unremarkable.

Other: Small left inguinal hernia containing fat. No free fluid in
the pelvis. Negative for free air. Umbilical hernia containing fat.

Musculoskeletal: No acute abnormality.
IMPRESSION: No acute abnormality.

Atrophy and duct dilatation in the pancreatic body and pancreatic
neck region. Findings most likely represent the sequelae of previous
pancreatitis. No acute inflammatory changes around the pancreas.

1.6 cm lesion in the right hepatic lobe. This has probably been
present since 1707 and suspect this represents a benign etiology,
such as a cavernous hemangioma.

## 2017-08-18 ENCOUNTER — Other Ambulatory Visit: Payer: Self-pay | Admitting: *Deleted

## 2017-08-18 NOTE — Patient Outreach (Signed)
[facility]) Care Management  08/18/2017  Willie [name] [birth year] [id number]   No response from patient outreach attempts will proceed with case closure.    Objective:Per KPN (Knowledge Performance Now, point of care tool) and chart review,patient hospitalized 07/28/17 -07/30/17 forDKA (diabetic ketoacidoses). Patient also has a history of hypertension, alcohol abuse, sleep apnea (not on CPAP),and chronic pancreatitis.    Assessment: Received Focus Plan referral on 07/30/17.Transition of care follow up not completed due to unable to contact patient and will proceed with case closure.       Plan:Case closure due to unable to reach.        Shloime Keilman H. Annia Friendly, BSN, [facility] Telephonic CM Phone: [phone] Fax: [phone]

## 2017-08-19 ENCOUNTER — Encounter: Payer: Self-pay | Admitting: Gastroenterology

## 2017-08-27 ENCOUNTER — Other Ambulatory Visit: Payer: Self-pay | Admitting: Family

## 2017-08-29 MED ORDER — PANTOPRAZOLE SODIUM 40 MG PO TBEC: 40.0000 mg | DELAYED_RELEASE_TABLET | Freq: Two times a day (BID) | ORAL | 5 refills | Status: DC

## 2017-08-29 MED ORDER — FOLIC ACID 1 MG PO TABS: 1.0000 mg | ORAL_TABLET | Freq: Every day | ORAL | 5 refills | Status: DC

## 2017-08-29 MED FILL — PANTOPRAZOLE SOD DR 40 MG T: 40 | 90 days supply | Qty: 180 | Fill #0

## 2017-08-29 MED FILL — FOLIC ACID 1 MG TABS: 1 | 90 days supply | Qty: 90 | Fill #0

## 2017-08-29 MED FILL — ZOLPIDEM TARTRATE 5 MG TABL: 5 | 30 days supply | Qty: 30 | Fill #0

## 2017-08-29 NOTE — Telephone Encounter (Signed)
LVM to inform pt that rx was sent to pharmacy.  °

## 2017-08-29 NOTE — Telephone Encounter (Signed)
[name] -- I sent refill of Protonix. Please advise the folic acid as we have not prescribed that before and zolpidem is no longer on his med list. Was removed with note to stop taking at discharge... Pt is waiting in the lobby.  [08/29/2017 2:02 PM]  Koren Bound:   [name] I have a big favor to ask, I have Willie [name] here stating that he requested refill on Wednesday from the pharmacy for Pantoprazole (protonix) that he takes 2 a day, ambien and folic acid at [facility] and that they send him up here cause they haven't received anything , he states that he is leaving next week and won't be in town, can he have it done today

## 2017-08-29 NOTE — Telephone Encounter (Signed)
Spoke with pt and informed him the below

## 2017-08-29 NOTE — Telephone Encounter (Signed)
PCP sent Willie [name] and gave verbal auth for the folic acid. Rx sent.

## 2017-08-29 NOTE — Addendum Note (Signed)
Addended by: Kelle Darting A on: 08/29/2017 02:27 PM   Modules accepted: Orders

## 2017-08-29 NOTE — Telephone Encounter (Signed)
Requesting: Ambien Contract:08/06/17 UDS:02/27/17 moderate risk-due for update Last Visit:08/06/17 Next Visit: none with pcp Last Refill:07/30/17  Not on current medication list  Please Advise

## 2017-08-29 NOTE — Telephone Encounter (Signed)
Copied from [place] [phone]. Topic: Quick Communication - See Telephone Encounter >> Aug 29, 2017  2:03 PM Rosalin Hawking wrote: CRM for notification. See Telephone encounter for: 08/29/17.     Pt came in office stating is needing refill on Ambien, pantoprazole (PROTONIX) 40 MG tablet twice a day and for folic acid (FOLVITE) 1 MG tablet. Pt is leaving out of town next wk and is needing this refill for today, since pharmacy is not open on the wkends. Please advise ASAP. Please call at [phone] pt tel.

## 2017-09-02 ENCOUNTER — Encounter: Payer: Self-pay | Admitting: Gastroenterology

## 2017-09-11 MED FILL — LANTUS SOLOSTAR 100 UNITS/M: 100 | 38 days supply | Qty: 15 | Fill #1

## 2017-09-11 MED FILL — ESCITALOPRAM 20 MG TABLET: 20 | 90 days supply | Qty: 90 | Fill #0

## 2017-09-29 ENCOUNTER — Telehealth: Payer: Self-pay | Admitting: *Deleted

## 2017-09-29 MED ORDER — ZOLPIDEM TARTRATE 5 MG PO TABS: 5.0000 mg | ORAL_TABLET | Freq: Every evening | ORAL | 2 refills | Status: DC | PRN

## 2017-09-29 MED ORDER — LORAZEPAM 0.5 MG PO TABS: 0.5000 mg | ORAL_TABLET | Freq: Three times a day (TID) | ORAL | 2 refills | Status: DC | PRN

## 2017-09-29 MED FILL — VALACYCLOVIR HCL 500 MG TAB: 500 | 90 days supply | Qty: 90 | Fill #0

## 2017-09-29 MED FILL — LORazepam 0.5 MG TABS: 0.5 | 10 days supply | Qty: 30 | Fill #0 | Status: TO

## 2017-09-29 MED FILL — ZOLPIDEM TARTRATE 5 MG TAB: 5 | 30 days supply | Qty: 30 | Fill #0 | Status: TO

## 2017-09-29 MED FILL — HUMALOG 100 UNITS/ML KWIKPE: 100 | 50 days supply | Qty: 15 | Fill #0

## 2017-09-29 MED FILL — FREESTYLE LIBRE 14 DAY SENS: 28 days supply | Qty: 2 | Fill #0

## 2017-09-29 NOTE — Telephone Encounter (Signed)
Pt walked in to the office stating he ran out of lorazepam and zolpidem this weekend and needs medication tonight. Reports that he called pharmacy this morning for refills but I do not see that refills have come through our system yet. Pt requests to wait until Rxs are sent. I advised him PCP is still seeing patients and he may wait in the lobby and we will let him know when it has been sent.   Last lorazepam RX: 08/06/17, #30 Last zolpidem RX: 08/29/17, #30. Last OV: 08/06/17 Next OV: 11/05/17 UDS: 08/06/17, moderate risk CSC: 08/06/17 CSR: Please see NARX report on ledge

## 2017-09-29 NOTE — Telephone Encounter (Signed)
Reviewed [place] controlled substance registry and refills are appropriate. Refills sent to pharmacy.

## 2017-10-13 ENCOUNTER — Emergency Department (HOSPITAL_BASED_OUTPATIENT_CLINIC_OR_DEPARTMENT_OTHER)
Admission: EM | Admit: 2017-10-13 | Discharge: 2017-10-13 | Disposition: A | Discharge status: Home / Self Care | Payer: No Typology Code available for payment source | Attending: Emergency Medicine | Admitting: Emergency Medicine

## 2017-10-13 ENCOUNTER — Emergency Department (HOSPITAL_BASED_OUTPATIENT_CLINIC_OR_DEPARTMENT_OTHER): Payer: No Typology Code available for payment source

## 2017-10-13 ENCOUNTER — Encounter (HOSPITAL_BASED_OUTPATIENT_CLINIC_OR_DEPARTMENT_OTHER): Payer: Self-pay | Admitting: Emergency Medicine

## 2017-10-13 ENCOUNTER — Other Ambulatory Visit: Payer: Self-pay

## 2017-10-13 DIAGNOSIS — K860 Alcohol-induced chronic pancreatitis: Secondary | ICD-10-CM | POA: Diagnosis not present

## 2017-10-13 DIAGNOSIS — R109 Unspecified abdominal pain: Secondary | ICD-10-CM | POA: Diagnosis not present

## 2017-10-13 DIAGNOSIS — R112 Nausea with vomiting, unspecified: Secondary | ICD-10-CM | POA: Insufficient documentation

## 2017-10-13 DIAGNOSIS — I10 Essential (primary) hypertension: Secondary | ICD-10-CM | POA: Diagnosis not present

## 2017-10-13 DIAGNOSIS — R5383 Other fatigue: Secondary | ICD-10-CM | POA: Insufficient documentation

## 2017-10-13 DIAGNOSIS — Z794 Long term (current) use of insulin: Secondary | ICD-10-CM | POA: Insufficient documentation

## 2017-10-13 DIAGNOSIS — E119 Type 2 diabetes mellitus without complications: Secondary | ICD-10-CM | POA: Diagnosis not present

## 2017-10-13 DIAGNOSIS — Z87891 Personal history of nicotine dependence: Secondary | ICD-10-CM | POA: Insufficient documentation

## 2017-10-13 DIAGNOSIS — Z79899 Other long term (current) drug therapy: Secondary | ICD-10-CM | POA: Diagnosis not present

## 2017-10-13 LAB — COMPREHENSIVE METABOLIC PANEL
ALBUMIN: 4 g/dL (ref 3.5–5.0)
ALK PHOS: 172 U/L — AB (ref 38–126)
ALT: 34 U/L (ref 17–63)
ANION GAP: 13 (ref 5–15)
AST: 46 U/L — ABNORMAL HIGH (ref 15–41)
BILIRUBIN TOTAL: 1 mg/dL (ref 0.3–1.2)
BUN: 8 mg/dL (ref 6–20)
CALCIUM: 8.7 mg/dL — AB (ref 8.9–10.3)
CO2: 29 mmol/L (ref 22–32)
Chloride: 96 mmol/L — ABNORMAL LOW (ref 101–111)
Creatinine, Ser: 0.81 mg/dL (ref 0.61–1.24)
Glucose, Bld: 183 mg/dL — ABNORMAL HIGH (ref 65–99)
POTASSIUM: 3.9 mmol/L (ref 3.5–5.1)
Sodium: 138 mmol/L (ref 135–145)
Total Protein: 7.6 g/dL (ref 6.5–8.1)

## 2017-10-13 LAB — URINALYSIS, ROUTINE W REFLEX MICROSCOPIC
Glucose, UA: >=500 mg/dL — AB
KETONES UR: 40 mg/dL — AB
LEUKOCYTES UA: NEGATIVE
NITRITE: NEGATIVE
PH: 6.5 (ref 5.0–8.0)
Protein, ur: 100 mg/dL — AB
SPECIFIC GRAVITY, URINE: 1.02 (ref 1.005–1.030)

## 2017-10-13 LAB — CBC
HEMATOCRIT: 43.5 % (ref 39.0–52.0)
HEMOGLOBIN: 15.9 g/dL (ref 13.0–17.0)
MCH: 29.3 pg (ref 26.0–34.0)
MCHC: 36.6 g/dL — ABNORMAL HIGH (ref 30.0–36.0)
MCV: 80.1 fL (ref 78.0–100.0)
Platelets: 337 10*3/uL (ref 150–400)
RBC: 5.43 MIL/uL (ref 4.22–5.81)
RDW: 12.5 % (ref 11.5–15.5)
WBC: 8.8 10*3/uL (ref 4.0–10.5)

## 2017-10-13 LAB — URINALYSIS, MICROSCOPIC (REFLEX)

## 2017-10-13 LAB — CBG MONITORING, ED: GLUCOSE-CAPILLARY: 155 mg/dL — AB (ref 65–99)

## 2017-10-13 LAB — LIPASE, BLOOD: Lipase: 17 U/L (ref 11–51)

## 2017-10-13 MED ORDER — SODIUM CHLORIDE 0.9 % IV BOLUS
1000.0000 mL | Freq: Once | INTRAVENOUS | Status: AC
  Administered 2017-10-13: 1000 mL via INTRAVENOUS

## 2017-10-13 MED ORDER — FAMOTIDINE IN NACL 20-0.9 MG/50ML-% IV SOLN
20.0000 mg | Freq: Once | INTRAVENOUS | Status: AC
  Administered 2017-10-13: 20 mg via INTRAVENOUS
  Filled 2017-10-13: qty 50

## 2017-10-13 MED ORDER — FENTANYL CITRATE (PF) 100 MCG/2ML IJ SOLN
50.0000 ug | Freq: Once | INTRAMUSCULAR | Status: AC
  Administered 2017-10-13: 50 ug via INTRAVENOUS
  Filled 2017-10-13: qty 2

## 2017-10-13 MED ORDER — LORAZEPAM 2 MG/ML IJ SOLN
1.0000 mg | Freq: Once | INTRAMUSCULAR | Status: AC
  Administered 2017-10-13: 14:00:00 via INTRAVENOUS
  Filled 2017-10-13: qty 1

## 2017-10-13 MED ORDER — GI COCKTAIL ~~LOC~~
30.0000 mL | Freq: Once | ORAL | Status: AC
  Administered 2017-10-13: 30 mL via ORAL
  Filled 2017-10-13: qty 30

## 2017-10-13 MED ORDER — ONDANSETRON 4 MG PO TBDP: ORAL_TABLET | ORAL | 0 refills | Status: DC

## 2017-10-13 MED ORDER — SODIUM CHLORIDE 0.9 % IV BOLUS
500.0000 mL | Freq: Once | INTRAVENOUS | Status: AC
  Administered 2017-10-13: 500 mL via INTRAVENOUS

## 2017-10-13 MED ORDER — FENTANYL CITRATE (PF) 100 MCG/2ML IJ SOLN
50.0000 ug | Freq: Once | INTRAMUSCULAR | Status: AC
Start: 2017-10-13 — End: 2017-10-13
  Administered 2017-10-13: 50 ug via INTRAVENOUS
  Filled 2017-10-13: qty 2

## 2017-10-13 MED ORDER — ONDANSETRON HCL 4 MG/2ML IJ SOLN
4.0000 mg | Freq: Once | INTRAMUSCULAR | Status: AC | PRN
  Administered 2017-10-13: 4 mg via INTRAVENOUS
  Filled 2017-10-13: qty 2

## 2017-10-13 MED ORDER — IOPAMIDOL (ISOVUE-300) INJECTION 61%
100.0000 mL | Freq: Once | INTRAVENOUS | Status: AC | PRN
  Administered 2017-10-13: 100 mL via INTRAVENOUS

## 2017-10-13 NOTE — ED Notes (Signed)
ED Provider at bedside. 

## 2017-10-13 NOTE — ED Notes (Signed)
Patient transported to CT 

## 2017-10-13 NOTE — ED Notes (Signed)
Dr. Tamera Punt said Pt. May be discharged if Pt. HR down after bolus infused.

## 2017-10-13 NOTE — ED Provider Notes (Signed)
[facility] EMERGENCY DEPARTMENT Provider Note   CSN: [id number] Arrival date & time: 10/13/17  1008     History   Chief Complaint Chief Complaint  Patient presents with  . Emesis    HPI Willie [name] is a 42 y.o. male.  Patient is a 42 year old male with a history of diabetes, alcoholic pancreatitis, hypertension, hyperlipidemia who presents with abdominal pain and vomiting.  He states he has been vomiting for the last 24 hours.  He has pain across his upper abdomen.  His pain is consistent with his prior episodes of pancreatitis.  He states he has been drinking but his last drink was 3 days ago.  He has a little bit of a tremor.  He denies any hematemesis.  No bilious emesis.  No diarrhea.  No known fevers.     Past Medical History:  Diagnosis Date  . Alcohol abuse   . Alcoholic pancreatitis    recurrent  . Depression   . Diabetes mellitus, type II ([place])    New Onset 03/2010  . GERD (gastroesophageal reflux disease)   . High cholesterol   . 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 Hypertension   . Sleep apnea    does not wear a CPAP    Patient Active Problem List   Diagnosis Date Noted  . Alcohol-induced mood disorder ([place])   . DKA (diabetic ketoacidoses) ([place]) 07/28/2017  . Alcohol withdrawal ([place]) 07/28/2017  . Abnormal CT scan, colon   . Benign neoplasm of descending colon   . Lower GI bleed 07/04/2017  . Transaminitis 07/04/2017  . Pain   . Bleeding internal hemorrhoids   . Anemia of chronic disease   . Left arm weakness   . Anemia, blood loss   . Vomiting alone 03/27/2017  . Chest pain 03/27/2017  . Nausea and vomiting   . Lactic acidosis 03/26/2017  . History of alcohol abuse 06/25/2016  . Tobacco abuse 06/25/2016  . Anxiety state 11/20/2015  . Dental infection 07/10/2015  . Mood disorder ([place]) 04/18/2015  . Eczema 06/27/2014  . GERD (gastroesophageal reflux disease) 05/24/2014  .  Snoring 04/07/2013  . Suicide attempt by substance overdose ([place]) 02/10/2013  . Alcohol abuse 12/31/2012  . Erectile dysfunction 12/09/2012  . Hematuria 06/11/2012  . Vision problem 02/02/2012  . Insomnia 12/20/2011  . Chronic pancreatitis ([place]) 02/05/2011  . Depression with anxiety 11/13/2010  . Hyperlipemia 06/27/2010  . Diabetes type 2, uncontrolled ([place]) 05/22/2010  . Essential hypertension 05/22/2010    Past Surgical History:  Procedure Laterality Date  . COLONOSCOPY W/ BIOPSIES  09/2010   Dr. Ardis Hughs.  for intermittent rectal bleeding.  Mild sigmoid to descending diverticulosis.  Mild left colon erythema, benign biopsy, probably "prep effect"  . COLONOSCOPY WITH PROPOFOL N/A 07/06/2017   Procedure: COLONOSCOPY WITH PROPOFOL;  Surgeon: Jerene Bears, MD;  Location: [facility];  Service: Gastroenterology;  Laterality: N/A;  . LUMBAR MICRODISCECTOMY  ~ 2004   Dr Ellene Route        Home Medications    Prior to Admission medications   Medication Sig Start Date End Date Taking? Authorizing Provider  amLODipine (NORVASC) 10 MG tablet Take 1 tablet (10 mg total) by mouth daily. 02/27/17   Debbrah Alar, NP  Blood Glucose Monitoring Suppl (FREESTYLE FREEDOM LITE) w/Device KIT 2 (two) times daily 08/06/17   Debbrah Alar, NP  cloNIDine (CATAPRES) 0.1 MG tablet Take 1 tablet (0.1  mg total) by mouth 3 (three) times daily. 02/27/17   Debbrah Alar, NP  Continuous Blood Gluc Sensor MISC 1 each by Does not apply route as directed. Use as directed every 14 days. May dispense FreeStyle [employer] or similar. 08/06/17   Debbrah Alar, NP  escitalopram (LEXAPRO) 20 MG tablet Take 1 tablet (20 mg total) by mouth daily. 02/27/17   Debbrah Alar, NP  folic acid (FOLVITE) 1 MG tablet Take 1 tablet (1 mg total) by mouth daily. 08/29/17   Debbrah Alar, NP  glucose blood (FREESTYLE LITE) test strip Use as instructed 02/27/17   Debbrah Alar, NP  hydrocortisone  (ANUSOL-HC) 25 MG suppository Place 1 suppository (25 mg total) rectally 2 (two) times daily. 07/07/17   Bonnielee Haff, MD  Insulin Glargine (LANTUS SOLOSTAR) 100 UNIT/ML Solostar Pen Inject 20 Units into the skin daily at 10 pm. 07/30/17   Aline August, MD  insulin lispro (HUMALOG KWIKPEN) 100 UNIT/ML KiwkPen 3 times a day (just before each meal) 09-10-08 units, Patient taking differently: Inject 5-15 Units into the skin See admin instructions. Inject 15 units into the skin with breakfast then 5 units with lunch then 15 units with dinner (evening meal) 02/27/17   Debbrah Alar, NP  Insulin Pen Needle (ULTICARE SHORT PEN NEEDLES) 31G X 8 MM MISC Use once daily to inject insulin. 02/27/17   Debbrah Alar, NP  Lancets (FREESTYLE) lancets 2 (two) times daily 02/27/17   Debbrah Alar, NP  lisinopril (PRINIVIL,ZESTRIL) 20 MG tablet TAKE 1 TABLET (20 MG TOTAL) BY MOUTH DAILY. Patient taking differently: Take 20 mg by mouth daily.  02/27/17   Debbrah Alar, NP  LORazepam (ATIVAN) 0.5 MG tablet Take 1 tablet (0.5 mg total) by mouth every 8 (eight) hours as needed for anxiety. 09/29/17   Debbrah Alar, NP  Multiple Vitamins-Minerals (MULTIVITAMIN) tablet Take 1 tablet by mouth daily. 02/12/16   Palumbo, April, MD  ondansetron (ZOFRAN ODT) 4 MG disintegrating tablet 21m ODT q4 hours prn nausea/vomit 10/13/17   BMalvin Johns MD  pantoprazole (PROTONIX) 40 MG tablet Take 1 tablet (40 mg total) by mouth 2 (two) times daily. 08/29/17   ODebbrah Alar NP  QUEtiapine (SEROQUEL) 300 MG tablet Take 1 tablet (300 mg total) by mouth at bedtime. 02/27/17   ODebbrah Alar NP  thiamine 100 MG tablet Take 1 tablet (100 mg total) by mouth daily. 02/27/17   ODebbrah Alar NP  zolpidem (AMBIEN) 5 MG tablet Take 1 tablet (5 mg total) by mouth at bedtime as needed. for sleep 09/29/17   ODebbrah Alar NP    Family History Family History  Problem Relation Age of Onset  . Diabetes  Mother   . Lung cancer Brother        twin brother  . Pancreatic cancer Paternal Aunt   . Colon cancer Neg Hx   . Stomach cancer Neg Hx     Social History Social History   Tobacco Use  . Smoking status: Former Smoker    Packs/day: 0.25    Years: 1.00    Pack years: 0.25    Types: Cigarettes    Start date: 04/29/2016  . Smokeless tobacco: Never Used  . Tobacco comment: 4 Cigarettes a day   Substance Use Topics  . Alcohol use: Yes    Alcohol/week: 25.2 oz    Types: 42 Cans of beer per week  . Drug use: No    Comment: none in years     Allergies   Invokamet [canagliflozin-metformin hcl]  and Metformin and related   Review of Systems Review of Systems  Constitutional: Positive for fatigue. Negative for chills, diaphoresis and fever.  HENT: Negative for congestion, rhinorrhea and sneezing.   Eyes: Negative.   Respiratory: Negative for cough, chest tightness and shortness of breath.   Cardiovascular: Negative for chest pain and leg swelling.  Gastrointestinal: Positive for abdominal pain, nausea and vomiting. Negative for blood in stool and diarrhea.  Genitourinary: Negative for difficulty urinating, flank pain, frequency and hematuria.  Musculoskeletal: Negative for arthralgias and back pain.  Skin: Negative for rash.  Neurological: Negative for dizziness, speech difficulty, weakness, numbness and headaches.  Psychiatric/Behavioral: The patient is nervous/anxious.      Physical Exam Updated Vital Signs BP (!) 148/100   Pulse (!) 111   Temp 99 F (37.2 C) (Oral)   Resp 20   Ht 6' (1.829 m)   Wt 93.9 kg (207 lb)   SpO2 97%   BMI 28.07 kg/m   Physical Exam  Constitutional: He is oriented to person, place, and time. He appears well-developed and well-nourished. He appears distressed.  HENT:  Head: Normocephalic and atraumatic.  Eyes: Pupils are equal, round, and reactive to light.  Neck: Normal range of motion. Neck supple.  Cardiovascular: Normal rate, regular  rhythm and normal heart sounds.  Pulmonary/Chest: Effort normal and breath sounds normal. No respiratory distress. He has no wheezes. He has no rales. He exhibits no tenderness.  Abdominal: Soft. Bowel sounds are normal. There is tenderness (Moderate tenderness across the upper abdomen). There is no rebound and no guarding.  Musculoskeletal: Normal range of motion. He exhibits no edema.  Lymphadenopathy:    He has no cervical adenopathy.  Neurological: He is alert and oriented to person, place, and time.  Slight tremor  Skin: Skin is warm and dry. No rash noted.  Psychiatric: He has a normal mood and affect.     ED Treatments / Results  Labs (all labs ordered are listed, but only abnormal results are displayed) Labs Reviewed  COMPREHENSIVE METABOLIC PANEL - Abnormal; Notable for the following components:      Result Value   Chloride 96 (*)    Glucose, Bld 183 (*)    Calcium 8.7 (*)    AST 46 (*)    Alkaline Phosphatase 172 (*)    All other components within normal limits  CBC - Abnormal; Notable for the following components:   MCHC 36.6 (*)    All other components within normal limits  URINALYSIS, ROUTINE W REFLEX MICROSCOPIC - Abnormal; Notable for the following components:   Color, Urine AMBER (*)    Glucose, UA >=500 (*)    Hgb urine dipstick TRACE (*)    Bilirubin Urine SMALL (*)    Ketones, ur 40 (*)    Protein, ur 100 (*)    All other components within normal limits  URINALYSIS, MICROSCOPIC (REFLEX) - Abnormal; Notable for the following components:   Bacteria, UA RARE (*)    All other components within normal limits  CBG MONITORING, ED - Abnormal; Notable for the following components:   Glucose-Capillary 155 (*)    All other components within normal limits  LIPASE, BLOOD    EKG EKG Interpretation  Date/Time:  Monday October 13 2017 10:19:15 EDT Ventricular Rate:  123 PR Interval:  162 QRS Duration: 80 QT Interval:  322 QTC Calculation: 460 R Axis:   37 Text  Interpretation:  Sinus tachycardia Otherwise normal ECG since last tracing no significant change Confirmed  by Malvin Johns [phone]) on 10/13/2017 10:29:23 AM   Radiology US Abdomen Complete  Result Date: 10/13/2017 CLINICAL DATA:  Upper abdominal pain. EXAM: ABDOMEN ULTRASOUND COMPLETE COMPARISON:  CT scan of July 28, 2017. FINDINGS: Gallbladder: No gallstones or wall thickening visualized. No sonographic Murphy sign noted by sonographer. Common bile duct: Diameter: 4.2 mm. Liver: Increased echogenicity of hepatic parenchyma is noted consistent with fatty infiltration. Probable 9 mm cyst seen in left hepatic lobe. Portal vein is patent on color Doppler imaging with normal direction of blood flow towards the liver. IVC: No abnormality visualized. Pancreas: Not visualized due to overlying bowel gas. Spleen: Size and appearance within normal limits. Right Kidney: Length: 11.9 cm. Echogenicity within normal limits. No mass or hydronephrosis visualized. Left Kidney: Length: 12.2 cm. Echogenicity within normal limits. No mass or hydronephrosis visualized. Abdominal aorta: No evidence of abdominal aortic aneurysm. Other findings: None. IMPRESSION: Pancreas not visualized due to overlying bowel gas. Increased echogenicity of hepatic parenchyma is noted consistent with fatty infiltration. Probable left hepatic cyst is noted. No other abnormality seen in the abdomen. Electronically Signed   By: Marijo Conception, M.D.   On: 10/13/2017 13:38    Procedures Procedures (including critical care time)  Medications Ordered in ED Medications  sodium chloride 0.9 % bolus 500 mL (has no administration in time range)  ondansetron (ZOFRAN) injection 4 mg (4 mg Intravenous Given 10/13/17 1133)  sodium chloride 0.9 % bolus 1,000 mL (0 mLs Intravenous Stopped 10/13/17 1230)  famotidine (PEPCID) IVPB 20 mg premix (0 mg Intravenous Stopped 10/13/17 1235)  fentaNYL (SUBLIMAZE) injection 50 mcg (50 mcg Intravenous Given 10/13/17  1205)  fentaNYL (SUBLIMAZE) injection 50 mcg (50 mcg Intravenous Given 10/13/17 1421)  sodium chloride 0.9 % bolus 1,000 mL (0 mLs Intravenous Stopped 10/13/17 1535)  LORazepam (ATIVAN) injection 1 mg ( Intravenous Given 10/13/17 1420)     Initial Impression / Assessment and Plan / ED Course  I have reviewed the triage vital signs and the nursing notes.  Pertinent labs & imaging results that were available during my care of the patient were reviewed by me and considered in my medical decision making (see chart for details).     Patient is a 42 year old male who presents with nausea and vomiting associate with abdominal pain.  His symptoms are consistent with his prior episodes of pancreatitis.  He has no hematemesis.  His labs are non-concerning.  His alk phos is a little bit elevated but his LFTs are otherwise non-concerning.  An abdominal ultrasound was performed which shows no concerning abnormalities.  His gallbladder appears normal.  He has no anion gap or suggestions of DKA.  He was given Pepcid as well as Zofran and IV fluids.  He was given 1 dose of Ativan but does not really have symptoms consistent with withdrawal.  He is not tremulous.  He is feeling better after treatment and is able to tolerate oral fluids.  However, his HR is still a bit elevated.  Will give more fluids and reassess.  If his HR improves and he can still tolerate oral fluids, can likely go home.  Dr. Thomasene Lot to follow.  Final Clinical Impressions(s) / ED Diagnoses   Final diagnoses:  Non-intractable vomiting with nausea, unspecified vomiting type  Alcohol-induced chronic pancreatitis [facility])    ED Discharge Orders        Ordered    ondansetron (ZOFRAN ODT) 4 MG disintegrating tablet     10/13/17 1556  Malvin Johns, MD 10/13/17 1600

## 2017-10-13 NOTE — ED Triage Notes (Signed)
Patient states that he has had N/V since last night. The patient also reports that he has a head and chills and aches. The patient also reports SOB

## 2017-10-13 NOTE — ED Notes (Signed)
Pt. Had reported to RN Rod Holler that he had been dropped off at [facility] then told RN Rojelio Brenner that he drove to [facility].  Pt. Was asked by RN Rojelio Brenner how he was getting home Pt. Stated he was going to "you know / you know/ [place]"  Then pt. Said "I only live a mile away".  Pt. Has asked multiple times for more pain meds.  Pt. Is going for a CT scan.  RN Rosana Hoes has encouraged Pt. To use urinal if he feels like he may have a full bladder and to call if he vomits or feels like he is nauseated.

## 2017-10-14 ENCOUNTER — Encounter: Payer: Self-pay | Admitting: Family

## 2017-10-15 ENCOUNTER — Telehealth: Payer: Self-pay

## 2017-10-15 MED FILL — QUETIAPINE 300 MG TABLET: 300 | 90 days supply | Qty: 90 | Fill #0

## 2017-10-15 NOTE — Telephone Encounter (Signed)
ED follow up call made to patient to schedule appointment. Left message for return call.

## 2017-10-16 NOTE — Telephone Encounter (Signed)
Called patient to schedule ED follow up appointment. No answer. 2nd call.

## 2017-10-22 MED FILL — AMLODIPINE BESYLATE 10 MG T: 10 | 90 days supply | Qty: 90 | Fill #0

## 2017-11-04 MED FILL — LORazepam 0.5 MG TABS: 0.5 | 10 days supply | Qty: 30 | Fill #0 | Status: TO

## 2017-11-04 MED FILL — ZOLPIDEM TARTRATE 5 MG TAB: 5 | 30 days supply | Qty: 30 | Fill #0 | Status: TO

## 2017-11-05 ENCOUNTER — Ambulatory Visit: Payer: No Typology Code available for payment source | Admitting: Family

## 2017-11-05 ENCOUNTER — Telehealth: Payer: Self-pay | Admitting: *Deleted

## 2017-11-05 MED ORDER — CONTINUOUS BLOOD GLUC SENSOR MISC: 1.0000 | 1 refills | Status: DC

## 2017-11-05 NOTE — Telephone Encounter (Signed)
Received fax from [place] request RX for freestyle Libre 14 day sensor. I do not see that we have prescribed this for pt before. Pt was scheduled for follow up today but cancelled.  Please advise?

## 2017-11-05 NOTE — Telephone Encounter (Signed)
Ok to send rx please with refills.

## 2017-11-05 NOTE — Telephone Encounter (Signed)
Rx sent 

## 2017-11-19 ENCOUNTER — Other Ambulatory Visit: Payer: Self-pay | Admitting: Family

## 2017-11-19 MED FILL — FREESTYLE LIBRE 14 DAY SENS: 28 days supply | Qty: 2 | Fill #0

## 2017-11-19 MED FILL — LISINOPRIL 20 MG TABLET: 20 | 90 days supply | Qty: 90 | Fill #0

## 2017-11-19 MED FILL — HUMALOG 100 UNITS/ML KWIKPE: 100 | 50 days supply | Qty: 15 | Fill #1

## 2017-11-19 MED FILL — cloNIDine HCL 0.1 MG TABS: 0.1 | 90 days supply | Qty: 270 | Fill #0

## 2017-11-19 NOTE — Telephone Encounter (Signed)
Refill sent, she will likely need to see him for a HTN check. TY.

## 2017-11-21 NOTE — Telephone Encounter (Signed)
Author phoned pt. to schedule appointment per Dr. Irene Limbo recommendation. No answer; author left VM to call back to make [place] appointment with PCP. Awaiting call back.

## 2017-11-23 ENCOUNTER — Emergency Department (HOSPITAL_BASED_OUTPATIENT_CLINIC_OR_DEPARTMENT_OTHER)
Admission: EM | Admit: 2017-11-23 | Discharge: 2017-11-24 | Disposition: A | Discharge status: Home / Self Care | Payer: No Typology Code available for payment source | Attending: Emergency Medicine | Admitting: Emergency Medicine

## 2017-11-23 ENCOUNTER — Other Ambulatory Visit: Payer: Self-pay

## 2017-11-23 ENCOUNTER — Encounter (HOSPITAL_BASED_OUTPATIENT_CLINIC_OR_DEPARTMENT_OTHER): Payer: Self-pay | Admitting: *Deleted

## 2017-11-23 DIAGNOSIS — F419 Anxiety disorder, unspecified: Secondary | ICD-10-CM | POA: Diagnosis not present

## 2017-11-23 DIAGNOSIS — Y92410 Unspecified street and highway as the place of occurrence of the external cause: Secondary | ICD-10-CM | POA: Diagnosis not present

## 2017-11-23 DIAGNOSIS — F1010 Alcohol abuse, uncomplicated: Secondary | ICD-10-CM | POA: Insufficient documentation

## 2017-11-23 DIAGNOSIS — Z79899 Other long term (current) drug therapy: Secondary | ICD-10-CM | POA: Diagnosis not present

## 2017-11-23 DIAGNOSIS — E119 Type 2 diabetes mellitus without complications: Secondary | ICD-10-CM | POA: Diagnosis not present

## 2017-11-23 DIAGNOSIS — Y998 Other external cause status: Secondary | ICD-10-CM | POA: Diagnosis not present

## 2017-11-23 DIAGNOSIS — F329 Major depressive disorder, single episode, unspecified: Secondary | ICD-10-CM | POA: Diagnosis not present

## 2017-11-23 DIAGNOSIS — Z87891 Personal history of nicotine dependence: Secondary | ICD-10-CM | POA: Diagnosis not present

## 2017-11-23 DIAGNOSIS — R000 Tachycardia, unspecified: Secondary | ICD-10-CM | POA: Diagnosis not present

## 2017-11-23 DIAGNOSIS — I10 Essential (primary) hypertension: Secondary | ICD-10-CM | POA: Insufficient documentation

## 2017-11-23 DIAGNOSIS — S199XXA Unspecified injury of neck, initial encounter: Secondary | ICD-10-CM | POA: Diagnosis present

## 2017-11-23 DIAGNOSIS — S161XXA Strain of muscle, fascia and tendon at neck level, initial encounter: Secondary | ICD-10-CM | POA: Diagnosis not present

## 2017-11-23 DIAGNOSIS — Z794 Long term (current) use of insulin: Secondary | ICD-10-CM | POA: Diagnosis not present

## 2017-11-23 DIAGNOSIS — E860 Dehydration: Secondary | ICD-10-CM | POA: Insufficient documentation

## 2017-11-23 DIAGNOSIS — Y9389 Activity, other specified: Secondary | ICD-10-CM | POA: Diagnosis not present

## 2017-11-23 LAB — CBC WITH DIFFERENTIAL/PLATELET
Basophils Absolute: 0 10*3/uL (ref 0.0–0.1)
Basophils Relative: 1 %
EOS ABS: 0.1 10*3/uL (ref 0.0–0.7)
EOS PCT: 1 %
HCT: 43.8 % (ref 39.0–52.0)
Hemoglobin: 15.7 g/dL (ref 13.0–17.0)
LYMPHS ABS: 1.6 10*3/uL (ref 0.7–4.0)
Lymphocytes Relative: 24 %
MCH: 29.6 pg (ref 26.0–34.0)
MCHC: 35.8 g/dL (ref 30.0–36.0)
MCV: 82.6 fL (ref 78.0–100.0)
MONO ABS: 0.6 10*3/uL (ref 0.1–1.0)
Monocytes Relative: 9 %
Neutro Abs: 4.5 10*3/uL (ref 1.7–7.7)
Neutrophils Relative %: 65 %
PLATELETS: 241 10*3/uL (ref 150–400)
RBC: 5.3 MIL/uL (ref 4.22–5.81)
RDW: 15.4 % (ref 11.5–15.5)
WBC: 6.8 10*3/uL (ref 4.0–10.5)

## 2017-11-23 LAB — CBG MONITORING, ED: Glucose-Capillary: 194 mg/dL — ABNORMAL HIGH (ref 70–99)

## 2017-11-23 LAB — ETHANOL: ALCOHOL ETHYL (B): 15 mg/dL — AB (ref <10)

## 2017-11-23 MED ORDER — FENTANYL CITRATE (PF) 100 MCG/2ML IJ SOLN
50.0000 ug | Freq: Once | INTRAMUSCULAR | Status: AC
  Administered 2017-11-23: 50 ug via INTRAVENOUS
  Filled 2017-11-23: qty 2

## 2017-11-23 MED ORDER — SODIUM CHLORIDE 0.9 % IV BOLUS
1000.0000 mL | Freq: Once | INTRAVENOUS | Status: AC
  Administered 2017-11-23: 1000 mL via INTRAVENOUS

## 2017-11-23 NOTE — ED Notes (Signed)
EDP into room, prior to RN assessment, see MD notes, pending orders.   
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
## 2017-11-24 LAB — BASIC METABOLIC PANEL
ANION GAP: 13 (ref 5–15)
BUN: 8 mg/dL (ref 6–20)
CHLORIDE: 97 mmol/L — AB (ref 98–111)
CO2: 26 mmol/L (ref 22–32)
CREATININE: 0.79 mg/dL (ref 0.61–1.24)
Calcium: 7.9 mg/dL — ABNORMAL LOW (ref 8.9–10.3)
GFR calc non Af Amer: >60 mL/min (ref >60)
Glucose, Bld: 240 mg/dL — ABNORMAL HIGH (ref 70–99)
Potassium: 3.2 mmol/L — ABNORMAL LOW (ref 3.5–5.1)
Sodium: 136 mmol/L (ref 135–145)

## 2017-11-24 LAB — COMPREHENSIVE METABOLIC PANEL
ALK PHOS: 97 U/L (ref 38–126)
ALT: 50 U/L — ABNORMAL HIGH (ref 0–44)
ANION GAP: 22 — AB (ref 5–15)
AST: 79 U/L — ABNORMAL HIGH (ref 15–41)
Albumin: 4.2 g/dL (ref 3.5–5.0)
BUN: 9 mg/dL (ref 6–20)
CALCIUM: 8.7 mg/dL — AB (ref 8.9–10.3)
CHLORIDE: 90 mmol/L — AB (ref 98–111)
CO2: 22 mmol/L (ref 22–32)
Creatinine, Ser: 0.8 mg/dL (ref 0.61–1.24)
GFR calc non Af Amer: >60 mL/min (ref >60)
Glucose, Bld: 211 mg/dL — ABNORMAL HIGH (ref 70–99)
POTASSIUM: 3.5 mmol/L (ref 3.5–5.1)
SODIUM: 134 mmol/L — AB (ref 135–145)
Total Bilirubin: 1.5 mg/dL — ABNORMAL HIGH (ref 0.3–1.2)
Total Protein: 7.7 g/dL (ref 6.5–8.1)

## 2017-11-24 LAB — LIPASE, BLOOD: Lipase: 20 U/L (ref 11–51)

## 2017-11-24 MED ORDER — SODIUM CHLORIDE 0.9 % IV BOLUS
1000.0000 mL | Freq: Once | INTRAVENOUS | Status: AC
  Administered 2017-11-24: 1000 mL via INTRAVENOUS

## 2017-11-24 MED ORDER — NAPROXEN 500 MG PO TABS: 500.0000 mg | ORAL_TABLET | Freq: Two times a day (BID) | ORAL | 0 refills | Status: DC

## 2017-11-24 MED ORDER — CYCLOBENZAPRINE HCL 5 MG PO TABS: 5.0000 mg | ORAL_TABLET | Freq: Two times a day (BID) | ORAL | 0 refills | Status: DC | PRN

## 2017-11-24 MED FILL — NAPROXEN 500 MG TABLET: 500 | 15 days supply | Qty: 30 | Fill #0

## 2017-11-24 MED FILL — CYCLOBENZAPRINE 5 MG TABLET: 5 | 5 days supply | Qty: 10 | Fill #0

## 2017-11-24 NOTE — ED Notes (Signed)
EDP into room 
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
## 2017-12-22 ENCOUNTER — Other Ambulatory Visit: Payer: Self-pay | Admitting: Family

## 2017-12-22 MED FILL — ZOLPIDEM TARTRATE 5 MG TAB: 5 | 30 days supply | Qty: 30 | Fill #0

## 2017-12-22 MED FILL — FREESTYLE LIBRE 14 DAY SENS: 28 days supply | Qty: 2 | Fill #1

## 2017-12-22 MED FILL — PANTOPRAZOLE SOD DR 40 MG T: 40 | 90 days supply | Qty: 180 | Fill #1

## 2017-12-22 MED FILL — LORazepam 0.5 MG TABS: 0.5 | 10 days supply | Qty: 30 | Fill #0

## 2017-12-22 MED FILL — FOLIC ACID 1 MG TABS: 1 | 90 days supply | Qty: 90 | Fill #1

## 2017-12-22 MED FILL — ESCITALOPRAM 20 MG TABLET: 20 | 90 days supply | Qty: 90 | Fill #1

## 2018-01-22 ENCOUNTER — Telehealth: Payer: Self-pay | Admitting: Family

## 2018-01-22 MED FILL — QUETIAPINE 300 MG TABLET: 300 | 30 days supply | Qty: 30 | Fill #0

## 2018-01-22 MED FILL — VALACYCLOVIR HCL 500 MG TAB: 500 | 90 days supply | Qty: 90 | Fill #1

## 2018-01-22 MED FILL — hydrOXYzine HCL 50 MG TABS: 50 | 30 days supply | Qty: 90 | Fill #0

## 2018-01-22 MED FILL — FREESTYLE LIBRE 14 DAY SENS: 28 days supply | Qty: 2 | Fill #0

## 2018-01-22 MED FILL — HUMALOG 100 UNITS/ML KWIKPE: 100 | 50 days supply | Qty: 15 | Fill #2

## 2018-01-22 MED FILL — ZOLPIDEM TARTRATE 5 MG TAB: 5 | 30 days supply | Qty: 30 | Fill #0

## 2018-01-22 MED FILL — AMLODIPINE BESYLATE 10 MG T: 10 | 90 days supply | Qty: 90 | Fill #1

## 2018-01-22 NOTE — Telephone Encounter (Signed)
1 month supply meds sent. Past due for follow up.  Please contact pt to arrange follow up appointment.

## 2018-01-23 NOTE — Telephone Encounter (Signed)
Called pt and LVM regarding his Rx refills. Informed pt that a 1 month supply was sent in. However, he is past due for a follow up and PCP will not be able fill any further meds without a visit. Advised pt to call and schedule an appt.

## 2018-01-29 MED FILL — LANTUS SOLOSTAR 100 UNITS/M: 100 | 38 days supply | Qty: 15 | Fill #0

## 2018-03-11 ENCOUNTER — Other Ambulatory Visit: Payer: Self-pay | Admitting: *Deleted

## 2018-03-11 MED ORDER — LORAZEPAM 0.5 MG PO TABS: 0.5000 mg | ORAL_TABLET | Freq: Three times a day (TID) | ORAL | 0 refills | Status: DC | PRN

## 2018-03-11 MED ORDER — QUETIAPINE FUMARATE 300 MG PO TABS: 300.0000 mg | ORAL_TABLET | Freq: Every day | ORAL | 0 refills | Status: DC

## 2018-03-11 MED ORDER — ZOLPIDEM TARTRATE 5 MG PO TABS: 5.0000 mg | ORAL_TABLET | Freq: Every evening | ORAL | 0 refills | Status: DC | PRN

## 2018-03-11 MED ORDER — HYDROXYZINE HCL 50 MG PO TABS: 50.0000 mg | ORAL_TABLET | Freq: Three times a day (TID) | ORAL | 0 refills | Status: DC | PRN

## 2018-03-11 MED ORDER — FREESTYLE LIBRE 14 DAY SENSOR MISC: 1.0000 | 0 refills | Status: DC

## 2018-03-11 MED FILL — QUETIAPINE 300 MG TABLET: 300 | 15 days supply | Qty: 15 | Fill #0

## 2018-03-11 MED FILL — hydrOXYzine HCL 50 MG TABS: 50 | 14 days supply | Qty: 42 | Fill #0

## 2018-03-11 MED FILL — FREESTYLE LIBRE 14 DAY SENS: 28 days supply | Qty: 2 | Fill #0

## 2018-03-11 NOTE — Telephone Encounter (Signed)
2 week supply sent to pharmacy. Please see requests for lorazepam and zolpidem:  Last Lorazepam RX: 09/29/17, #30 x 2 refill Last Zolpidem RX:  01/22/18, #30 Last OV: 08/06/17 Next OV: 03/18/18 UDS: 08/06/17, moderate CSC: 08/06/17 CSR: No discrepancies identified

## 2018-03-11 NOTE — Telephone Encounter (Signed)
Copied from [place] [id number]. Topic: General - Other >> Mar 11, 2018  9:43 AM Carolyn Stare wrote: Damaris Schooner with [facility] and she said she will call in a week supply of the below med   Lorazepam, hydroxyine, zolpidem, quetiapine    and   Continuous Blood Gluc Sensor (FREESTYLE LIBRE [place]) [place]

## 2018-03-18 ENCOUNTER — Ambulatory Visit: Payer: No Typology Code available for payment source | Admitting: Family

## 2018-03-18 ENCOUNTER — Encounter: Payer: Self-pay | Admitting: Family

## 2018-03-19 ENCOUNTER — Ambulatory Visit: Payer: No Typology Code available for payment source | Admitting: Medical

## 2018-03-19 ENCOUNTER — Encounter: Payer: Self-pay | Admitting: Medical

## 2018-03-19 DIAGNOSIS — Z0289 Encounter for other administrative examinations: Secondary | ICD-10-CM

## 2018-03-20 ENCOUNTER — Ambulatory Visit (INDEPENDENT_AMBULATORY_CARE_PROVIDER_SITE_OTHER): Payer: No Typology Code available for payment source | Admitting: Medical

## 2018-03-20 ENCOUNTER — Encounter: Payer: Self-pay | Admitting: Medical

## 2018-03-20 VITALS — BP 114/88 | HR 118 | Temp 98.4°F | Resp 16 | Ht 72.0 in | Wt 186.8 lb

## 2018-03-20 DIAGNOSIS — F419 Anxiety disorder, unspecified: Secondary | ICD-10-CM | POA: Diagnosis not present

## 2018-03-20 DIAGNOSIS — F329 Major depressive disorder, single episode, unspecified: Secondary | ICD-10-CM | POA: Diagnosis not present

## 2018-03-20 DIAGNOSIS — F32A Depression, unspecified: Secondary | ICD-10-CM

## 2018-03-20 DIAGNOSIS — F1010 Alcohol abuse, uncomplicated: Secondary | ICD-10-CM | POA: Diagnosis not present

## 2018-03-20 DIAGNOSIS — G4700 Insomnia, unspecified: Secondary | ICD-10-CM

## 2018-03-20 DIAGNOSIS — R000 Tachycardia, unspecified: Secondary | ICD-10-CM

## 2018-03-20 DIAGNOSIS — R634 Abnormal weight loss: Secondary | ICD-10-CM

## 2018-03-20 DIAGNOSIS — E1165 Type 2 diabetes mellitus with hyperglycemia: Secondary | ICD-10-CM

## 2018-03-20 DIAGNOSIS — R197 Diarrhea, unspecified: Secondary | ICD-10-CM

## 2018-03-20 DIAGNOSIS — R5383 Other fatigue: Secondary | ICD-10-CM

## 2018-03-20 DIAGNOSIS — R109 Unspecified abdominal pain: Secondary | ICD-10-CM

## 2018-03-20 MED ORDER — CLONIDINE HCL 0.1 MG PO TABS: 0.1000 mg | ORAL_TABLET | Freq: Three times a day (TID) | ORAL | 1 refills | Status: DC

## 2018-03-20 MED ORDER — THIAMINE HCL 100 MG PO TABS: 100.0000 mg | ORAL_TABLET | Freq: Every day | ORAL | 1 refills | Status: DC

## 2018-03-20 MED ORDER — AMLODIPINE BESYLATE 10 MG PO TABS: 10.0000 mg | ORAL_TABLET | Freq: Every day | ORAL | 1 refills | Status: DC

## 2018-03-20 MED ORDER — QUETIAPINE FUMARATE 300 MG PO TABS: 300.0000 mg | ORAL_TABLET | Freq: Every day | ORAL | 0 refills | Status: DC

## 2018-03-20 MED ORDER — LORAZEPAM 0.5 MG PO TABS: 0.5000 mg | ORAL_TABLET | Freq: Three times a day (TID) | ORAL | 0 refills | Status: DC | PRN

## 2018-03-20 MED ORDER — LISINOPRIL 20 MG PO TABS: 20.0000 mg | ORAL_TABLET | Freq: Every day | ORAL | 1 refills | Status: DC

## 2018-03-20 MED ORDER — ESCITALOPRAM OXALATE 20 MG PO TABS
20.0000 mg | ORAL_TABLET | Freq: Every day | ORAL | 1 refills | Status: DC
Start: 2018-03-20 — End: 2018-07-24

## 2018-03-20 MED ORDER — PANTOPRAZOLE SODIUM 40 MG PO TBEC: 40.0000 mg | DELAYED_RELEASE_TABLET | Freq: Two times a day (BID) | ORAL | 5 refills | Status: DC

## 2018-03-20 MED ORDER — ZOLPIDEM TARTRATE 5 MG PO TABS: 5.0000 mg | ORAL_TABLET | Freq: Every evening | ORAL | 2 refills | Status: DC | PRN

## 2018-03-20 MED ORDER — HYDROXYZINE HCL 50 MG PO TABS: 50.0000 mg | ORAL_TABLET | Freq: Three times a day (TID) | ORAL | 0 refills | Status: DC | PRN

## 2018-03-20 MED FILL — ESCITALOPRAM 20 MG TABLET: 20 | 90 days supply | Qty: 90 | Fill #0

## 2018-03-20 MED FILL — LISINOPRIL 20 MG TABLET: 20 | 90 days supply | Qty: 90 | Fill #0

## 2018-03-20 MED FILL — PANTOPRAZOLE SOD DR 40 MG T: 40 | 30 days supply | Qty: 60 | Fill #0

## 2018-03-20 MED FILL — cloNIDine HCL 0.1 MG TABS: 0.1 | 90 days supply | Qty: 270 | Fill #0

## 2018-03-20 MED FILL — VITAMIN B-1 100 MG TABLET: 100 | 100 days supply | Qty: 100 | Fill #0

## 2018-03-20 MED FILL — LORazepam 0.5 MG TABS: 0.5 | 10 days supply | Qty: 30 | Fill #0

## 2018-03-20 MED FILL — ZOLPIDEM TARTRATE 5 MG TAB: 5 | 30 days supply | Qty: 30 | Fill #0

## 2018-03-20 NOTE — Patient Instructions (Signed)
You had some severe weight loss recently.  I do think this is a combination of probable uncontrolled diabetes, calorie deprivation and being dehydrated from alcohol use.  Continue with sliding scale insulin 3 times daily with meals.  Continue with the Lantus at night.  We will get A1c to assess your average blood sugar.   Your mood is moderately stable presently.  Continue with current medications for depression.  For anxiety, continue with benzodiazepine. For insomnia, continue with Ambien.  For fatigue, diabetes, and alcohol abuse, we are getting the below listed labs.  Recommend you keep yourself hydrated with [employer] water or sugar-free Gatorade.  For recent chronic diarrhea, I want you to pick up stool panel turn those in.  If no cause found and diarrhea persist will refer you back to GI.  For history of abdominal pain and weight loss, will get a CBC and pancreas proteins.  Your chronic low level tachycardia is probably related to alcohol abuse and dehydration.  If signs and symptoms worsen over the weekend or any severely abnormal lab values and would recommend emergency department.  Follow-up in 7 to 10 days with PCP or as needed.

## 2018-03-20 NOTE — Progress Notes (Signed)
Subjective:    Patient ID: Willie [name], male    DOB: July 20, 1975, 42 y.o.   MRN: [id number]  HPI  Pt states that he has been losing weight. He lost 40 lbs over past 2 months. He states he is easily fatigued. Pt states he is eating one meal a day. At times will eat 2 times a day. So busy at work sometimes he won't eat at work. He states will eat whatever is in the freezer at home. Pt states he will have  bowel movement after he eats. No vomiting. States yesterday had 7-8 loose stools. Today loose stools x today. Also over past 2 months if eats will have loose stools.  Pt does get occasional abd pain He is on protonix and helps with abd pain. Pt had colonoscopy last year. No obvious cause of bleeding back then. Maybe hemorrhoid. No diverticulosis but no diverticulits.  Pt in past had blood in stool in spring but not recently. Resolved months ago.  Pt has history of tachcardia. ED thought alcohol related and dehydration.  Pt diabetic and his sugars have been high. Last night sugar was 172. Today was in 340 range when we rechecked.  Pt is on humalog 3 times a day. He was out of humalog 10 days ago. Also he is on lantus 40 units pm.   Pt over last week just got divorce papers.  Pt drinking about 6 beers a day.  He stats mood stable despite circumstances Has anxiety and insomnia. He states needs refills of ativan and ambien. Also needs refill of lexapro.   Review of Systems  Constitutional: Positive for fatigue.  Respiratory: Negative for apnea, cough, choking, shortness of breath and wheezing.   Cardiovascular: Negative for chest pain and palpitations.  Gastrointestinal: Positive for abdominal pain and diarrhea. Negative for abdominal distention, blood in stool and nausea.       See hpi.   Abdomen pain when eats.  Musculoskeletal: Negative for back pain and neck pain.  Skin: Negative for rash.  Neurological: Negative for dizziness and headaches.  Hematological: Negative for  adenopathy. Does not bruise/bleed easily.  Psychiatric/Behavioral: Positive for dysphoric mood and sleep disturbance. Negative for behavioral problems and suicidal ideas. The patient is nervous/anxious.     Past Medical History:  Diagnosis Date  . Alcohol abuse   . Alcoholic pancreatitis    recurrent  . Depression   . Diabetes mellitus, type II ([place])    New Onset 03/2010  . GERD (gastroesophageal reflux disease)   . High cholesterol   . 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 Hypertension   . Sleep apnea    does not wear a CPAP     Social History   Socioeconomic History  . Marital status: Married    Spouse name: Not on file  . Number of children: 2  . Years of education: Not on file  . Highest education level: Not on file  Occupational History  . Occupation: Landscape architect: [employer]  . Financial resource strain: Not on file  . Food insecurity:    Worry: Not on file    Inability: Not on file  . Transportation needs:    Medical: Not on file    Non-medical: Not on file  Tobacco Use  . Smoking status: Former Smoker    Packs/day: 0.25    Years: 1.00  Pack years: 0.25    Types: Cigarettes    Start date: 04/29/2016  . Smokeless tobacco: Never Used  . Tobacco comment: 4 Cigarettes a day   Substance and Sexual Activity  . Alcohol use: Not Currently    Alcohol/week: 42.0 standard drinks    Types: 42 Cans of beer per week  . Drug use: Not Currently    Comment: none in years  . Sexual activity: Not on file  Lifestyle  . Physical activity:    Days per week: Not on file    Minutes per session: Not on file  . Stress: Not on file  Relationships  . Social connections:    Talks on phone: Not on file    Gets together: Not on file    Attends religious service: Not on file    Active member of club or organization: Not on file    Attends meetings of clubs or organizations:  Not on file    Relationship status: Not on file  . Intimate partner violence:    Fear of current or ex partner: Not on file    Emotionally abused: Not on file    Physically abused: Not on file    Forced sexual activity: Not on file  Other Topics Concern  . 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 COLONOSCOPY W/ BIOPSIES  09/2010   Dr. Ardis Hughs.  for intermittent rectal bleeding.  Mild sigmoid to descending diverticulosis.  Mild left colon erythema, benign biopsy, probably "prep effect"  . COLONOSCOPY WITH PROPOFOL N/A 07/06/2017   Procedure: COLONOSCOPY WITH PROPOFOL;  Surgeon: Jerene Bears, MD;  Location: [facility];  Service: Gastroenterology;  Laterality: N/A;  . LUMBAR MICRODISCECTOMY  ~ 2004   Dr Ellene Route    Family History  Problem Relation Age of Onset  . Diabetes Mother   . Lung cancer Brother        twin brother  . Pancreatic cancer Paternal Aunt   . Colon cancer Neg Hx   . Stomach cancer Neg Hx     Allergies  Allergen Reactions  . Invokamet [Canagliflozin-Metformin Hcl] Other (See Comments)    Lactic acidosis  . Metformin And Related Other (See Comments)    DRASTIC drop in blood sugar    Current Outpatient Medications on File Prior to Visit  Medication Sig Dispense Refill  . amLODipine (NORVASC) 10 MG tablet Take 1 tablet (10 mg total) by mouth daily. 90 tablet 1  . Continuous Blood Gluc Sensor (FREESTYLE LIBRE 14 DAY SENSOR) MISC Inject 1 each into the skin every 14 (fourteen) days. 2 each 0  . cyclobenzaprine (FLEXERIL) 5 MG tablet Take 1 tablet (5 mg total) by mouth 2 (two) times daily as needed for muscle spasms. 10 tablet 0  . folic acid (FOLVITE) 1 MG tablet Take 1 tablet (1 mg total) by mouth daily. 30 tablet 5  . glucose  blood (FREESTYLE LITE) test strip Use as instructed 100 each 12  . hydrocortisone (ANUSOL-HC) 25 MG suppository Place 1 suppository (25 mg total) rectally 2 (two) times daily. 60 suppository 0  . insulin lispro (HUMALOG KWIKPEN) 100 UNIT/ML KiwkPen 3 times a day (  just before each meal) 09-10-08 units, (Patient taking differently: Inject 5-15 Units into the skin See admin instructions. Inject 15 units into the skin with breakfast then 5 units with lunch then 15 units with dinner (evening meal)) 15 mL 4  . Insulin Pen Needle (ULTICARE SHORT PEN NEEDLES) 31G X 8 MM MISC Use once daily to inject insulin. 100 each 1  . Lancets (FREESTYLE) lancets 2 (two) times daily 200 each 5  . LANTUS SOLOSTAR 100 UNIT/ML Solostar Pen INJECT 40 UNITS INTO THE SKIN DAILY AT 10 PM. 15 mL 0  . LORazepam (ATIVAN) 0.5 MG tablet Take 1 tablet (0.5 mg total) by mouth every 8 (eight) hours as needed for anxiety. 30 tablet 0  . Multiple Vitamins-Minerals (MULTIVITAMIN) tablet Take 1 tablet by mouth daily. 30 tablet 0  . ondansetron (ZOFRAN ODT) 4 MG disintegrating tablet 4mg  ODT q4 hours prn nausea/vomit 4 tablet 0  . zolpidem (AMBIEN) 5 MG tablet Take 1 tablet (5 mg total) by mouth at bedtime as needed. for sleep 30 tablet 0   No current facility-administered medications on file prior to visit.     BP 114/88   Pulse (!) 118   Temp 98.4 F (36.9 C) (Oral)   Resp 16   Ht 6' (1.829 m)   Wt 186 lb 12.8 oz (84.7 kg)   SpO2 98%   BMI 25.33 kg/m       Objective:   Physical Exam  General Mental Status- Alert. General Appearance- Not in acute distress.   Skin General: Color- Normal Color. Moisture- Normal Moisture.  Neck Carotid Arteries- Normal color. Moisture- Normal Moisture. No carotid bruits. No JVD.  Chest and Lung Exam Auscultation: Breath Sounds:-Normal.  Cardiovascular Auscultation:Rythm- Regular. Murmurs & Other Heart Sounds:Auscultation of the heart reveals- No  Murmurs.  Abdomen Inspection:-Inspeection Normal. Palpation/Percussion:Note:No mass. Palpation and Percussion of the abdomen reveal- Non Tender, Non Distended + BS, no rebound or guarding.    Neurologic Cranial Nerve exam:- CN III-XII intact(No nystagmus), symmetric smile. Strength:- 5/5 equal and symmetric strength both upper and lower extremities.      Assessment & Plan:  You had some severe weight loss recently.  I do think this is a combination of probable uncontrolled diabetes, calorie deprivation and being dehydrated from alcohol use.  Continue with sliding scale insulin 3 times daily with meals.  Continue with the Lantus at night.  We will get A1c to assess your average blood sugar.   Your mood is moderately stable presently.  Continue with current medications for depression.  For anxiety, continue with benzodiazepine. For insomnia, continue with Ambien.  For fatigue, diabetes, and alcohol abuse, we are getting the below listed labs.  Recommend you keep yourself hydrated with [employer] water or sugar-free Gatorade.  For recent chronic diarrhea, I want you to pick up stool panel turn those in.  If no cause found and diarrhea persist will refer you back to GI.  For history of abdominal pain and weight loss, will get a CBC and pancreas proteins.  Your chronic low level tachycardia is probably related to alcohol abuse and dehydration.  If signs and symptoms worsen over the weekend or any severely abnormal lab values and would recommend emergency department.  Follow-up in 7 to 10 days with PCP or as needed.  40 minutes spent with pt 50% of time counseled on plan going forward as explained in AVS.  Mackie Pai, PA-C

## 2018-03-27 ENCOUNTER — Telehealth: Payer: Self-pay | Admitting: Medical

## 2018-03-27 NOTE — Telephone Encounter (Signed)
I saw patient the other day and he did not get any of labs done. Will you call him and see why he did not get labs done. Schedule him follow up appointment with his pcp. Let me know if he intends to get labs done? If not will remove order. If he does intend to get labs please get him scheduled for lab.

## 2018-03-30 MED FILL — QUETIAPINE 300 MG TABLET: 300 | 30 days supply | Qty: 30 | Fill #0

## 2018-03-31 NOTE — Telephone Encounter (Signed)
Left Pt a message to call back. Okay for PEC to give information.

## 2018-04-21 ENCOUNTER — Encounter: Payer: Self-pay | Admitting: Family

## 2018-04-27 ENCOUNTER — Encounter: Payer: Self-pay | Admitting: Family

## 2018-04-27 ENCOUNTER — Other Ambulatory Visit: Payer: Self-pay | Admitting: Family

## 2018-04-27 ENCOUNTER — Ambulatory Visit (INDEPENDENT_AMBULATORY_CARE_PROVIDER_SITE_OTHER): Payer: No Typology Code available for payment source | Admitting: Family

## 2018-04-27 VITALS — BP 130/86 | HR 106 | Temp 99.0°F | Resp 16 | Ht 72.0 in | Wt 183.0 lb

## 2018-04-27 DIAGNOSIS — R634 Abnormal weight loss: Secondary | ICD-10-CM | POA: Diagnosis not present

## 2018-04-27 DIAGNOSIS — F1010 Alcohol abuse, uncomplicated: Secondary | ICD-10-CM

## 2018-04-27 DIAGNOSIS — E1165 Type 2 diabetes mellitus with hyperglycemia: Secondary | ICD-10-CM

## 2018-04-27 MED ORDER — FREESTYLE LIBRE 14 DAY SENSOR MISC: 1.0000 | 3 refills | Status: AC

## 2018-04-27 MED ORDER — HYDROXYZINE HCL 50 MG PO TABS: 50.0000 mg | ORAL_TABLET | Freq: Three times a day (TID) | ORAL | 1 refills | Status: DC | PRN

## 2018-04-27 MED ORDER — INSULIN GLARGINE 100 UNIT/ML SOLOSTAR PEN: 25.0000 [IU] | PEN_INJECTOR | Freq: Every day | SUBCUTANEOUS | 1 refills | Status: DC

## 2018-04-27 MED ORDER — INSULIN PEN NEEDLE 31G X 8 MM MISC: 1 refills | Status: AC

## 2018-04-27 MED ORDER — LORAZEPAM 0.5 MG PO TABS: 0.5000 mg | ORAL_TABLET | Freq: Three times a day (TID) | ORAL | 0 refills | Status: DC | PRN

## 2018-04-27 MED ORDER — INSULIN LISPRO 100 UNIT/ML CARTRIDGE: SUBCUTANEOUS | 5 refills | Status: DC

## 2018-04-27 MED ORDER — QUETIAPINE FUMARATE 300 MG PO TABS: 300.0000 mg | ORAL_TABLET | Freq: Every day | ORAL | 0 refills | Status: DC

## 2018-04-27 MED FILL — hydrOXYzine HCL 50 MG TABS: 50 | 60 days supply | Qty: 180 | Fill #0

## 2018-04-27 MED FILL — ZOLPIDEM TARTRATE 5 MG TAB: 5 | 30 days supply | Qty: 30 | Fill #1

## 2018-04-27 MED FILL — QUETIAPINE 300 MG TABLET: 300 | 90 days supply | Qty: 90 | Fill #0

## 2018-04-27 MED FILL — LORazepam 0.5 MG TABS: 0.5 | 10 days supply | Qty: 30 | Fill #0

## 2018-04-27 MED FILL — ULTICARE PEN NDL 8MM 31G: 31G X 8 MM | 100 days supply | Qty: 100 | Fill #0

## 2018-04-27 MED FILL — PANTOPRAZOLE SOD DR 40 MG T: 40 | 30 days supply | Qty: 60 | Fill #1

## 2018-04-27 MED FILL — HUMALOG 100 UNITS/ML KWIKPE: 100 | 50 days supply | Qty: 15 | Fill #0

## 2018-04-27 MED FILL — LANTUS SOLOSTAR 100 UNITS/M: 100 | 24 days supply | Qty: 6 | Fill #0

## 2018-04-27 NOTE — Patient Instructions (Signed)
Please complete lab work prior to leaving. Try to eat 3 well balanced meals and not miss any insulin. Drink lots of water to keep your hydration up. Continue to work on completely discontinuing alcohol.

## 2018-04-27 NOTE — Progress Notes (Signed)
Subjective:    Patient ID: Willie [name], male    DOB: 05/05/1975, 42 y.o.   MRN: [id number]  HPI   Patient is a 42 yr old male who presents today with chief complaint of weight loss.   Reports recent BMs normal, no blood, no diarrhea. Reports poor appetite.  Reports that he gets hungry at night.  Denies fevers/night sweats.  Maybe a little depressed. Had seen psychiatry previously on [place].  He is maintained on lexapro and seroquel.  Wt Readings from Last 3 Encounters:  04/27/18 183 lb (83 kg)  03/20/18 186 lb 12.8 oz (84.7 kg)  11/23/17 207 lb (93.9 kg)   DM2- reports that he ran out of needles x 2 days.   Lab Results  Component Value Date   HGBA1C 8.4 (H) 06/08/2017   HGBA1C 10.7 (H) 02/27/2017   HGBA1C 7.9 (H) 11/12/2016   Lab Results  Component Value Date   MICROALBUR CANCELED 11/12/2016   LDLCALC 25 06/08/2017   CREATININE 0.79 11/24/2017   2-3 beers every few days. Denies abdominal pain.  Denies nausea/vomiting.    Review of Systems   Past Medical History:  Diagnosis Date  . Alcohol abuse   . Alcoholic pancreatitis    recurrent  . Depression   . Diabetes mellitus, type II ([place])    New Onset 03/2010  . GERD (gastroesophageal reflux disease)   . High cholesterol   . 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 Hypertension   . Sleep apnea    does not wear a CPAP     Social History   Socioeconomic History  . Marital status: Married    Spouse name: Not on file  . Number of children: 2  . Years of education: Not on file  . Highest education level: Not on file  Occupational History  . Occupation: Landscape architect: [employer]  . Financial resource strain: Not on file  . Food insecurity:    Worry: Not on file    Inability: Not on file  . Transportation needs:    Medical: Not on file    Non-medical: Not on file  Tobacco Use  . Smoking status: Former  Smoker    Packs/day: 0.25    Years: 1.00    Pack years: 0.25    Types: Cigarettes    Start date: 04/29/2016  . Smokeless tobacco: Never Used  . Tobacco comment: 4 Cigarettes a day   Substance and Sexual Activity  . Alcohol use: Not Currently    Alcohol/week: 42.0 standard drinks    Types: 42 Cans of beer per week  . Drug use: Not Currently    Comment: none in years  . Sexual activity: Not on file  Lifestyle  . Physical activity:    Days per week: Not on file    Minutes per session: Not on file  . Stress: Not on file  Relationships  . Social connections:    Talks on phone: Not on file    Gets together: Not on file    Attends religious service: Not on file    Active member of club or organization: Not on file    Attends meetings of clubs or organizations: Not on file    Relationship status: Not on file  . Intimate partner violence:    Fear of current or ex partner: Not on file  Emotionally abused: Not on file    Physically abused: Not on file    Forced sexual activity: Not on file  Other Topics Concern  . 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 COLONOSCOPY W/ BIOPSIES  09/2010   Dr. Ardis Hughs.  for intermittent rectal bleeding.  Mild sigmoid to descending diverticulosis.  Mild left colon erythema, benign biopsy, probably "prep effect"  . COLONOSCOPY WITH PROPOFOL N/A 07/06/2017   Procedure: COLONOSCOPY WITH PROPOFOL;  Surgeon: Jerene Bears, MD;  Location: [facility];  Service: Gastroenterology;  Laterality: N/A;  . LUMBAR MICRODISCECTOMY  ~ 2004   Dr Ellene Route    Family History  Problem Relation Age of Onset  . Diabetes Mother   . Lung cancer Brother        twin brother  . Pancreatic cancer Paternal Aunt   . Colon cancer Neg Hx   .  Stomach cancer Neg Hx     Allergies  Allergen Reactions  . Invokamet [Canagliflozin-Metformin Hcl] Other (See Comments)    Lactic acidosis  . Metformin And Related Other (See Comments)    DRASTIC drop in blood sugar    Current Outpatient Medications on File Prior to Visit  Medication Sig Dispense Refill  . amLODipine (NORVASC) 10 MG tablet Take 1 tablet (10 mg total) by mouth daily. 90 tablet 1  . cloNIDine (CATAPRES) 0.1 MG tablet Take 1 tablet (0.1 mg total) by mouth 3 (three) times daily. 270 tablet 1  . cyclobenzaprine (FLEXERIL) 5 MG tablet Take 1 tablet (5 mg total) by mouth 2 (two) times daily as needed for muscle spasms. 10 tablet 0  . escitalopram (LEXAPRO) 20 MG tablet Take 1 tablet (20 mg total) by mouth daily. 90 tablet 1  . folic acid (FOLVITE) 1 MG tablet Take 1 tablet (1 mg total) by mouth daily. 30 tablet 5  . glucose blood (FREESTYLE LITE) test strip Use as instructed 100 each 12  . hydrOXYzine (ATARAX/VISTARIL) 50 MG tablet Take 1 tablet (50 mg total) by mouth 3 (three) times daily as needed. 90 tablet 0  . insulin lispro (HUMALOG KWIKPEN) 100 UNIT/ML KiwkPen 3 times a day (just before each meal) 09-10-08 units, (Patient taking differently: Inject 5-15 Units into the skin See admin instructions. Inject 15 units into the skin with breakfast then 5 units with lunch then 15 units with dinner (evening meal)) 15 mL 4  . Insulin Pen Needle (ULTICARE SHORT PEN NEEDLES) 31G X 8 MM MISC Use once daily to inject insulin. 100 each 1  . Lancets (FREESTYLE) lancets 2 (two) times daily 200 each 5  . LANTUS SOLOSTAR 100 UNIT/ML Solostar Pen INJECT 40 UNITS INTO THE SKIN DAILY AT 10 PM. 15 mL 0  . lisinopril (PRINIVIL,ZESTRIL) 20 MG tablet Take 1 tablet (20 mg total) by mouth daily. 90 tablet 1  . LORazepam (ATIVAN) 0.5 MG tablet Take 1 tablet (0.5 mg total) by mouth every 8 (eight) hours as needed for anxiety. Refill when due. 30 tablet 0  . Multiple Vitamins-Minerals (MULTIVITAMIN)  tablet Take 1 tablet by mouth daily. 30 tablet 0  . ondansetron (ZOFRAN ODT) 4 MG disintegrating tablet 4mg  ODT q4  hours prn nausea/vomit 4 tablet 0  . pantoprazole (PROTONIX) 40 MG tablet Take 1 tablet (40 mg total) by mouth 2 (two) times daily. 60 tablet 5  . QUEtiapine (SEROQUEL) 300 MG tablet Take 1 tablet (300 mg total) by mouth at bedtime. 30 tablet 0  . thiamine 100 MG tablet Take 1 tablet (100 mg total) by mouth daily. 90 tablet 1  . zolpidem (AMBIEN) 5 MG tablet Take 1 tablet (5 mg total) by mouth at bedtime as needed. for sleep 30 tablet 2   No current facility-administered medications on file prior to visit.     BP 130/86 (BP Location: Right Arm, Patient Position: Sitting, Cuff Size: Small)   Pulse (!) 106   Temp 99 F (37.2 C) (Oral)   Resp 16   Ht 6' (1.829 m)   Wt 183 lb (83 kg)   SpO2 100%   BMI 24.82 kg/m        Objective:   Physical Exam Constitutional:      General: He is not in acute distress.    Appearance: He is well-developed.  HENT:     Head: Normocephalic and atraumatic.  Cardiovascular:     Rate and Rhythm: Normal rate and regular rhythm.     Heart sounds: No murmur.  Pulmonary:     Effort: Pulmonary effort is normal. No respiratory distress.     Breath sounds: Normal breath sounds. No wheezing or rales.  Skin:    General: Skin is warm and dry.  Neurological:     Mental Status: He is alert and oriented to person, place, and time.  Psychiatric:        Behavior: Behavior normal.        Thought Content: Thought content normal.           Assessment & Plan:  DM2-  Lab Results  Component Value Date   HGBA1C 11.9 (H) 04/27/2018   Uncontrolled.  Refer to Endocrinology.   Weight loss- suspect that this is related to poor PO intake. We discussed importance of 3 well balanced meals/day. Uncontrolled DM2 is also likely a contributing factor. HIV is negative, TSH WNL.   Alcohol abuse- ongoing- he has cut down some. Recommended cessation.

## 2018-04-28 ENCOUNTER — Telehealth: Payer: Self-pay

## 2018-04-28 LAB — COMPREHENSIVE METABOLIC PANEL
ALT: 18 U/L (ref 0–53)
AST: 21 U/L (ref 0–37)
Albumin: 4.6 g/dL (ref 3.5–5.2)
Alkaline Phosphatase: 88 U/L (ref 39–117)
BILIRUBIN TOTAL: 0.5 mg/dL (ref 0.2–1.2)
BUN: 11 mg/dL (ref 6–23)
CO2: 30 mEq/L (ref 19–32)
Calcium: 10.3 mg/dL (ref 8.4–10.5)
Chloride: 92 mEq/L — ABNORMAL LOW (ref 96–112)
Creatinine, Ser: 0.87 mg/dL (ref 0.40–1.50)
GFR: 124.04 mL/min (ref >60.00)
GLUCOSE: 223 mg/dL — AB (ref 70–99)
Potassium: 4.6 mEq/L (ref 3.5–5.1)
Sodium: 133 mEq/L — ABNORMAL LOW (ref 135–145)
Total Protein: 7.8 g/dL (ref 6.0–8.3)

## 2018-04-28 LAB — CBC WITH DIFFERENTIAL/PLATELET
Basophils Absolute: 0.1 10*3/uL (ref 0.0–0.1)
Basophils Relative: 1.2 % (ref 0.0–3.0)
Eosinophils Absolute: 0 10*3/uL (ref 0.0–0.7)
Eosinophils Relative: 0.1 % (ref 0.0–5.0)
HCT: 40.5 % (ref 39.0–52.0)
Hemoglobin: 13.7 g/dL (ref 13.0–17.0)
LYMPHS ABS: 1.1 10*3/uL (ref 0.7–4.0)
Lymphocytes Relative: 10.3 % — ABNORMAL LOW (ref 12.0–46.0)
MCHC: 34 g/dL (ref 30.0–36.0)
MCV: 93.8 fl (ref 78.0–100.0)
Monocytes Absolute: 0.8 10*3/uL (ref 0.1–1.0)
Monocytes Relative: 7.6 % (ref 3.0–12.0)
Neutro Abs: 8.7 10*3/uL — ABNORMAL HIGH (ref 1.4–7.7)
Neutrophils Relative %: 80.8 % — ABNORMAL HIGH (ref 43.0–77.0)
Platelets: 412 10*3/uL — ABNORMAL HIGH (ref 150.0–400.0)
RBC: 4.32 Mil/uL (ref 4.22–5.81)
RDW: 12.4 % (ref 11.5–15.5)
WBC: 10.7 10*3/uL — ABNORMAL HIGH (ref 4.0–10.5)

## 2018-04-28 LAB — TSH: TSH: 0.8 u[IU]/mL (ref 0.35–4.50)

## 2018-04-28 LAB — HEMOGLOBIN A1C: Hgb A1c MFr Bld: 11.9 % — ABNORMAL HIGH (ref 4.6–6.5)

## 2018-04-28 MED ORDER — LORAZEPAM 0.5 MG PO TABS: 0.5000 mg | ORAL_TABLET | Freq: Three times a day (TID) | ORAL | 0 refills | Status: DC | PRN

## 2018-04-28 MED ORDER — QUETIAPINE FUMARATE 300 MG PO TABS: 300.0000 mg | ORAL_TABLET | Freq: Every day | ORAL | 0 refills | Status: DC

## 2018-04-28 NOTE — Telephone Encounter (Signed)
PA initiated via Covermymeds; KEY: [id number]. Awaiting determination.

## 2018-04-30 ENCOUNTER — Telehealth: Payer: Self-pay | Admitting: Family

## 2018-04-30 DIAGNOSIS — E1165 Type 2 diabetes mellitus with hyperglycemia: Secondary | ICD-10-CM

## 2018-04-30 LAB — HIV ANTIBODY (ROUTINE TESTING W REFLEX): HIV 1&2 Ab, 4th Generation: NONREACTIVE

## 2018-04-30 NOTE — Telephone Encounter (Signed)
Lm about referral, will try to reach patient again tomorrow.

## 2018-04-30 NOTE — Telephone Encounter (Signed)
PA approved.  The request has been approved. The authorization is effective for a maximum of 12 fills from 04/27/2018 to 04/27/2019, as long as the member is enrolled in their current health plan. The request was approved as submitted. This request is approved for 1 kit (2 sensors) per 28 days. Two additional prior authorizations (PAs) have been entered as follows: PA 1091 for [employer] Reader allowing 1 reader per 12 months for 1 fill. PA 1092 for Freestyle Libre 10-Day Senors allowing 1 kit (3 sensors) per month for 12 fills. These authorizations are effective from 04/27/2018 through 04/27/2019. A written notification letter will follow with additional details.

## 2018-04-30 NOTE — Telephone Encounter (Signed)
Please contact patient and let him know that his sugar is poorly controlled.  I would like to make a referral for him to get in to see endocrinology to help him.  Referral has been placed.

## 2018-05-01 ENCOUNTER — Encounter: Payer: Self-pay | Admitting: Family

## 2018-05-04 NOTE — Telephone Encounter (Signed)
Could you please try him again? thanks

## 2018-05-04 NOTE — Telephone Encounter (Signed)
Lm again for patient to call in reference to his results.

## 2018-05-05 NOTE — Telephone Encounter (Signed)
Patient is active in [facility], message with Mellisa's comments sent to patient as a message.

## 2018-05-07 NOTE — Telephone Encounter (Signed)
Letter mailed out to patient explaining that we have been trying to reach him. Providers comments about sugar levels and referral included in the letter. A copy of his results also mailed out to patient with the letter.

## 2018-05-07 NOTE — Telephone Encounter (Signed)
It doesn't look like he has read his mychart. Can you please mail him a letter with the info? Thanks.

## 2018-06-25 ENCOUNTER — Emergency Department (HOSPITAL_BASED_OUTPATIENT_CLINIC_OR_DEPARTMENT_OTHER)
Admission: EM | Admit: 2018-06-25 | Discharge: 2018-06-25 | Disposition: A | Discharge status: Home / Self Care | Payer: No Typology Code available for payment source | Attending: Emergency Medicine | Admitting: Emergency Medicine

## 2018-06-25 ENCOUNTER — Encounter (HOSPITAL_BASED_OUTPATIENT_CLINIC_OR_DEPARTMENT_OTHER): Payer: Self-pay | Admitting: Emergency Medicine

## 2018-06-25 ENCOUNTER — Other Ambulatory Visit: Payer: Self-pay

## 2018-06-25 DIAGNOSIS — Z79899 Other long term (current) drug therapy: Secondary | ICD-10-CM | POA: Insufficient documentation

## 2018-06-25 DIAGNOSIS — Z87891 Personal history of nicotine dependence: Secondary | ICD-10-CM | POA: Insufficient documentation

## 2018-06-25 DIAGNOSIS — K2920 Alcoholic gastritis without bleeding: Secondary | ICD-10-CM | POA: Insufficient documentation

## 2018-06-25 DIAGNOSIS — Z794 Long term (current) use of insulin: Secondary | ICD-10-CM | POA: Insufficient documentation

## 2018-06-25 DIAGNOSIS — E119 Type 2 diabetes mellitus without complications: Secondary | ICD-10-CM | POA: Insufficient documentation

## 2018-06-25 DIAGNOSIS — I10 Essential (primary) hypertension: Secondary | ICD-10-CM | POA: Insufficient documentation

## 2018-06-25 LAB — BASIC METABOLIC PANEL
Anion gap: 6 (ref 5–15)
BUN: 16 mg/dL (ref 6–20)
CHLORIDE: 102 mmol/L (ref 98–111)
CO2: 21 mmol/L — ABNORMAL LOW (ref 22–32)
Calcium: 5.7 mg/dL — CL (ref 8.9–10.3)
Creatinine, Ser: 1.14 mg/dL (ref 0.61–1.24)
GFR calc Af Amer: >60 mL/min (ref >60)
GFR calc non Af Amer: >60 mL/min (ref >60)
Glucose, Bld: 273 mg/dL — ABNORMAL HIGH (ref 70–99)
POTASSIUM: 3.3 mmol/L — AB (ref 3.5–5.1)
Sodium: 129 mmol/L — ABNORMAL LOW (ref 135–145)

## 2018-06-25 LAB — COMPREHENSIVE METABOLIC PANEL
ALBUMIN: 4.2 g/dL (ref 3.5–5.0)
ALT: 25 U/L (ref 0–44)
AST: 40 U/L (ref 15–41)
Alkaline Phosphatase: 124 U/L (ref 38–126)
Anion gap: 18 — ABNORMAL HIGH (ref 5–15)
BUN: 22 mg/dL — ABNORMAL HIGH (ref 6–20)
CO2: 24 mmol/L (ref 22–32)
Calcium: 8.6 mg/dL — ABNORMAL LOW (ref 8.9–10.3)
Chloride: 82 mmol/L — ABNORMAL LOW (ref 98–111)
Creatinine, Ser: 1.99 mg/dL — ABNORMAL HIGH (ref 0.61–1.24)
GFR calc Af Amer: 47 mL/min — ABNORMAL LOW (ref >60)
GFR calc non Af Amer: 41 mL/min — ABNORMAL LOW (ref >60)
Glucose, Bld: 438 mg/dL — ABNORMAL HIGH (ref 70–99)
Potassium: 4.7 mmol/L (ref 3.5–5.1)
Sodium: 124 mmol/L — ABNORMAL LOW (ref 135–145)
Total Bilirubin: 1.6 mg/dL — ABNORMAL HIGH (ref 0.3–1.2)
Total Protein: 7.9 g/dL (ref 6.5–8.1)

## 2018-06-25 LAB — LIPASE, BLOOD: Lipase: 17 U/L (ref 11–51)

## 2018-06-25 LAB — CBC WITH DIFFERENTIAL/PLATELET
Abs Immature Granulocytes: 0.04 10*3/uL (ref 0.00–0.07)
BASOS ABS: 0 10*3/uL (ref 0.0–0.1)
Basophils Relative: 0 %
Eosinophils Absolute: 0 10*3/uL (ref 0.0–0.5)
Eosinophils Relative: 0 %
HCT: 43.9 % (ref 39.0–52.0)
Hemoglobin: 14.9 g/dL (ref 13.0–17.0)
IMMATURE GRANULOCYTES: 0 %
Lymphocytes Relative: 6 %
Lymphs Abs: 0.6 10*3/uL — ABNORMAL LOW (ref 0.7–4.0)
MCH: 30.2 pg (ref 26.0–34.0)
MCHC: 33.9 g/dL (ref 30.0–36.0)
MCV: 89 fL (ref 80.0–100.0)
Monocytes Absolute: 0.7 10*3/uL (ref 0.1–1.0)
Monocytes Relative: 7 %
NEUTROS PCT: 87 %
NRBC: 0 % (ref 0.0–0.2)
Neutro Abs: 8.8 10*3/uL — ABNORMAL HIGH (ref 1.7–7.7)
Platelets: 459 10*3/uL — ABNORMAL HIGH (ref 150–400)
RBC: 4.93 MIL/uL (ref 4.22–5.81)
RDW: 12.6 % (ref 11.5–15.5)
WBC: 10.2 10*3/uL (ref 4.0–10.5)

## 2018-06-25 LAB — URINALYSIS, ROUTINE W REFLEX MICROSCOPIC
BILIRUBIN URINE: NEGATIVE
Glucose, UA: >=500 mg/dL — AB
Ketones, ur: NEGATIVE mg/dL
Leukocytes,Ua: NEGATIVE
Nitrite: NEGATIVE
Protein, ur: NEGATIVE mg/dL
Specific Gravity, Urine: 1.02 (ref 1.005–1.030)
pH: 5.5 (ref 5.0–8.0)

## 2018-06-25 LAB — URINALYSIS, MICROSCOPIC (REFLEX)

## 2018-06-25 LAB — CBG MONITORING, ED: Glucose-Capillary: 332 mg/dL — ABNORMAL HIGH (ref 70–99)

## 2018-06-25 MED ORDER — FAMOTIDINE IN NACL 20-0.9 MG/50ML-% IV SOLN
20.0000 mg | Freq: Once | INTRAVENOUS | Status: AC
  Administered 2018-06-25: 20 mg via INTRAVENOUS
  Filled 2018-06-25: qty 50

## 2018-06-25 MED ORDER — ALUM & MAG HYDROXIDE-SIMETH 200-200-20 MG/5ML PO SUSP
30.0000 mL | Freq: Once | ORAL | Status: AC
  Administered 2018-06-25: 30 mL via ORAL
  Filled 2018-06-25: qty 30

## 2018-06-25 MED ORDER — SODIUM CHLORIDE 0.9 % IV BOLUS
1000.0000 mL | Freq: Once | INTRAVENOUS | Status: AC
  Administered 2018-06-25: 1000 mL via INTRAVENOUS

## 2018-06-25 MED ORDER — LIDOCAINE VISCOUS HCL 2 % MT SOLN
15.0000 mL | Freq: Once | OROMUCOSAL | Status: AC
  Administered 2018-06-25: 15 mL via ORAL
  Filled 2018-06-25: qty 15

## 2018-06-25 MED ORDER — LORAZEPAM 2 MG/ML IJ SOLN
0.5000 mg | Freq: Once | INTRAMUSCULAR | Status: AC
  Administered 2018-06-25: 0.5 mg via INTRAVENOUS
  Filled 2018-06-25: qty 1

## 2018-06-25 MED ORDER — ONDANSETRON HCL 4 MG/2ML IJ SOLN
4.0000 mg | Freq: Once | INTRAMUSCULAR | Status: AC
  Administered 2018-06-25: 4 mg via INTRAVENOUS
  Filled 2018-06-25: qty 2

## 2018-06-25 MED ORDER — FENTANYL CITRATE (PF) 100 MCG/2ML IJ SOLN
50.0000 ug | Freq: Once | INTRAMUSCULAR | Status: AC
  Administered 2018-06-25: 50 ug via INTRAVENOUS
  Filled 2018-06-25: qty 2

## 2018-06-25 NOTE — ED Provider Notes (Signed)
[facility] EMERGENCY DEPARTMENT Provider Note   CSN: [id number] Arrival date & time: 06/25/18  1837    History   Chief Complaint Chief Complaint  Patient presents with  . Emesis    HPI Willie [name] is a 43 y.o. male.     Patient presents to ED today with complaint of nausea, vomiting, and diarrhea. He admits to heavy alcohol use last night. He awoke this morning with the nausea and vomiting. He endorses 15 episodes of vomiting throughout the course of the day, with six episodes of diarrhea. He has attempted po fluids, but states he is unable to keep anything down. Diffuse upper quadrant abdominal pain.  The history is provided by the patient and medical records. No language interpreter was used.  Emesis  Severity:  Severe Duration:  1 day Timing:  Constant Number of daily episodes:  15 Quality:  Stomach contents Progression:  Unchanged Chronicity:  Recurrent Recent urination:  Normal Associated symptoms: abdominal pain and diarrhea   Risk factors: alcohol use and diabetes     Past Medical History:  Diagnosis Date  . Alcohol abuse   . Alcoholic pancreatitis    recurrent  . Depression   . Diabetes mellitus, type II ([place])    New Onset 03/2010  . GERD (gastroesophageal reflux disease)   . High cholesterol   . 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 Hypertension   . Sleep apnea    does not wear a CPAP    Patient Active Problem List   Diagnosis Date Noted  . Alcohol-induced mood disorder ([place])   . DKA (diabetic ketoacidoses) ([place]) 07/28/2017  . Alcohol withdrawal ([place]) 07/28/2017  . Abnormal CT scan, colon   . Benign neoplasm of descending colon   . Lower GI bleed 07/04/2017  . Transaminitis 07/04/2017  . Pain   . Bleeding internal hemorrhoids   . Anemia of chronic disease   . Left arm weakness   . Anemia, blood loss   . Vomiting alone 03/27/2017  . Chest pain 03/27/2017  . Nausea and  vomiting   . Lactic acidosis 03/26/2017  . History of alcohol abuse 06/25/2016  . Tobacco abuse 06/25/2016  . Anxiety state 11/20/2015  . Dental infection 07/10/2015  . Mood disorder ([place]) 04/18/2015  . Eczema 06/27/2014  . GERD (gastroesophageal reflux disease) 05/24/2014  . Snoring 04/07/2013  . Suicide attempt by substance overdose ([place]) 02/10/2013  . Alcohol abuse 12/31/2012  . Erectile dysfunction 12/09/2012  . Hematuria 06/11/2012  . Vision problem 02/02/2012  . Insomnia 12/20/2011  . Chronic pancreatitis ([place]) 02/05/2011  . Depression with anxiety 11/13/2010  . Hyperlipemia 06/27/2010  . Diabetes type 2, uncontrolled ([place]) 05/22/2010  . Essential hypertension 05/22/2010    Past Surgical History:  Procedure Laterality Date  . COLONOSCOPY W/ BIOPSIES  09/2010   Dr. Ardis Hughs.  for intermittent rectal bleeding.  Mild sigmoid to descending diverticulosis.  Mild left colon erythema, benign biopsy, probably "prep effect"  . COLONOSCOPY WITH PROPOFOL N/A 07/06/2017   Procedure: COLONOSCOPY WITH PROPOFOL;  Surgeon: Jerene Bears, MD;  Location: [facility] END[facility]COPY;  Service: Gastroenterology;  Laterality: N/A;  . LUMBAR MICRODISCECTOMY  ~ 2004   Dr Ellene Route        Home Medications    Prior to Admission medications   Medication Sig Start Date End Date Taking? Authorizing Provider  amLODipine (NORVASC) 10 MG tablet Take 1 tablet (10 mg total) by  mouth daily. 03/20/18   Saguier, Percell Miller, PA-C  cloNIDine (CATAPRES) 0.1 MG tablet Take 1 tablet (0.1 mg total) by mouth 3 (three) times daily. 03/20/18   Saguier, Percell Miller, PA-C  Continuous Blood Gluc Sensor (FREESTYLE LIBRE 14 DAY SENSOR) MISC Inject 1 each into the skin every 14 (fourteen) days. 04/27/18   Debbrah Alar, NP  cyclobenzaprine (FLEXERIL) 5 MG tablet Take 1 tablet (5 mg total) by mouth 2 (two) times daily as needed for muscle spasms. 11/24/17   Horton, Barbette Hair, MD  escitalopram (LEXAPRO) 20 MG tablet Take 1 tablet (20 mg  total) by mouth daily. 03/20/18   Saguier, Percell Miller, PA-C  folic acid (FOLVITE) 1 MG tablet Take 1 tablet (1 mg total) by mouth daily. 08/29/17   Debbrah Alar, NP  glucose blood (FREESTYLE LITE) test strip Use as instructed 02/27/17   Debbrah Alar, NP  hydrOXYzine (ATARAX/VISTARIL) 50 MG tablet Take 1 tablet (50 mg total) by mouth 3 (three) times daily as needed. 04/27/18   Debbrah Alar, NP  Insulin Glargine (LANTUS SOLOSTAR) 100 UNIT/ML Solostar Pen Inject 25 Units into the skin at bedtime. 04/27/18   Debbrah Alar, NP  insulin lispro (HUMALOG KWIKPEN) 100 UNIT/ML KiwkPen 3 times a day (just before each meal) 09-10-08 units, Patient taking differently: Inject 5-15 Units into the skin See admin instructions. Inject 15 units into the skin with breakfast then 5 units with lunch then 15 units with dinner (evening meal) 02/27/17   Debbrah Alar, NP  insulin lispro (HUMALOG) 100 UNIT/ML cartridge Inject subcutaneously 3 times a day (just before each meal) 09-10-08 units 04/27/18   Debbrah Alar, NP  Insulin Pen Needle (ULTICARE SHORT PEN NEEDLES) 31G X 8 MM MISC Use once daily to inject insulin. 04/27/18   Debbrah Alar, NP  Lancets (FREESTYLE) lancets 2 (two) times daily 02/27/17   Debbrah Alar, NP  lisinopril (PRINIVIL,ZESTRIL) 20 MG tablet Take 1 tablet (20 mg total) by mouth daily. 03/20/18   Saguier, Percell Miller, PA-C  LORazepam (ATIVAN) 0.5 MG tablet Take 1 tablet (0.5 mg total) by mouth every 8 (eight) hours as needed for anxiety. Refill when due. 04/28/18   Debbrah Alar, NP  Multiple Vitamins-Minerals (MULTIVITAMIN) tablet Take 1 tablet by mouth daily. 02/12/16   Palumbo, April, MD  ondansetron (ZOFRAN ODT) 4 MG disintegrating tablet 4mg  ODT q4 hours prn nausea/vomit 10/13/17   Malvin Johns, MD  pantoprazole (PROTONIX) 40 MG tablet Take 1 tablet (40 mg total) by mouth 2 (two) times daily. 03/20/18   Saguier, Percell Miller, PA-C  QUEtiapine (SEROQUEL) 300 MG  tablet Take 1 tablet (300 mg total) by mouth at bedtime. 04/28/18   Debbrah Alar, NP  thiamine 100 MG tablet Take 1 tablet (100 mg total) by mouth daily. 03/20/18   Saguier, Percell Miller, PA-C  zolpidem (AMBIEN) 5 MG tablet Take 1 tablet (5 mg total) by mouth at bedtime as needed. for sleep 03/20/18   Saguier, Percell Miller, PA-C    Family History Family History  Problem Relation Age of Onset  . Diabetes Mother   . Lung cancer Brother        twin brother  . Pancreatic cancer Paternal Aunt   . Colon cancer Neg Hx   . Stomach cancer Neg Hx     Social History Social History   Tobacco Use  . Smoking status: Former Smoker    Packs/day: 0.25    Years: 1.00    Pack years: 0.25    Types: Cigarettes    Start date: 04/29/2016  . Smokeless  tobacco: Never Used  . Tobacco comment: 4 Cigarettes a day   Substance Use Topics  . Alcohol use: Not Currently    Alcohol/week: 42.0 standard drinks    Types: 42 Cans of beer per week  . Drug use: Not Currently    Comment: none in years     Allergies   Invokamet [canagliflozin-metformin hcl] and Metformin and related   Review of Systems Review of Systems  Gastrointestinal: Positive for abdominal pain, diarrhea, nausea and vomiting.  All other systems reviewed and are negative.    Physical Exam Updated Vital Signs BP (!) 131/93 (BP Location: Right Arm)   Pulse (!) 121   Temp 97.6 F (36.4 C) (Oral)   Resp 20   Ht 6' (1.829 m)   Wt 99.8 kg   SpO2 100%   BMI 29.84 kg/m   Physical Exam Vitals signs and nursing note reviewed.  Constitutional:      Appearance: Normal appearance. He is not toxic-appearing.  HENT:     Head: Atraumatic.  Eyes:     Conjunctiva/sclera: Conjunctivae normal.  Cardiovascular:     Rate and Rhythm: Regular rhythm. Tachycardia present.  Pulmonary:     Effort: Pulmonary effort is normal.     Breath sounds: Normal breath sounds.  Abdominal:     General: There is no distension.     Palpations: Abdomen is soft.      Tenderness: There is abdominal tenderness. There is no guarding.  Musculoskeletal: Normal range of motion.  Skin:    General: Skin is warm and dry.  Neurological:     Mental Status: He is alert and oriented to person, place, and time.  Psychiatric:        Mood and Affect: Mood normal.      ED Treatments / Results  Labs (all labs ordered are listed, but only abnormal results are displayed) Labs Reviewed  CBC WITH DIFFERENTIAL/PLATELET - Abnormal; Notable for the following components:      Result Value   Platelets 459 (*)    Neutro Abs 8.8 (*)    Lymphs Abs 0.6 (*)    All other components within normal limits  COMPREHENSIVE METABOLIC PANEL - Abnormal; Notable for the following components:   Sodium 124 (*)    Chloride 82 (*)    Glucose, Bld 438 (*)    BUN 22 (*)    Creatinine, Ser 1.99 (*)    Calcium 8.6 (*)    Total Bilirubin 1.6 (*)    GFR calc non Af Amer 41 (*)    GFR calc Af Amer 47 (*)    Anion gap 18 (*)    All other components within normal limits  URINALYSIS, ROUTINE W REFLEX MICROSCOPIC - Abnormal; Notable for the following components:   Glucose, UA >=500 (*)    Hgb urine dipstick SMALL (*)    All other components within normal limits  URINALYSIS, MICROSCOPIC (REFLEX) - Abnormal; Notable for the following components:   Bacteria, UA RARE (*)    All other components within normal limits  BASIC METABOLIC PANEL - Abnormal; Notable for the following components:   Sodium 129 (*)    Potassium 3.3 (*)    CO2 21 (*)    Glucose, Bld 273 (*)    Calcium 5.7 (*)    All other components within normal limits  CBG MONITORING, ED - Abnormal; Notable for the following components:   Glucose-Capillary 332 (*)    All other components within normal limits  LIPASE, BLOOD  EKG None  Radiology No results found.  Procedures Procedures (including critical care time)  Medications Ordered in ED Medications  sodium chloride 0.9 % bolus 1,000 mL (0 mLs Intravenous  Stopped 06/25/18 2004)  ondansetron (ZOFRAN) injection 4 mg (4 mg Intravenous Given 06/25/18 1903)  famotidine (PEPCID) IVPB 20 mg premix (0 mg Intravenous Stopped 06/25/18 1940)  fentaNYL (SUBLIMAZE) injection 50 mcg (50 mcg Intravenous Given 06/25/18 1948)  LORazepam (ATIVAN) injection 0.5 mg (0.5 mg Intravenous Given 06/25/18 1948)  sodium chloride 0.9 % bolus 1,000 mL (0 mLs Intravenous Stopped 06/25/18 2057)  alum & mag hydroxide-simeth (MAALOX/MYLANTA) 200-200-20 MG/5ML suspension 30 mL (30 mLs Oral Given 06/25/18 2229)    And  lidocaine (XYLOCAINE) 2 % viscous mouth solution 15 mL (15 mLs Oral Given 06/25/18 2229)     Initial Impression / Assessment and Plan / ED Course  I have reviewed the triage vital signs and the nursing notes.  Pertinent labs & imaging results that were available during my care of the patient were reviewed by me and considered in my medical decision making (see chart for details).    Patient discussed with Dr. Dayna Barker.  Patient improved after fluids and medications. Labs reviewed. Calcium level dropped to 5.7 after IV fluids--currently asymptomatic. Likely due to dilution. Patient is tolerating po fluids, is non-toxic appearing.    Patient with symptoms consistent gastritis Vitals are stable, no fever. Patient feels better after IV fluids and medications.  No focal abdominal pain, no concern for appendicitis, cholecystitis, pancreatitis, ruptured viscus, UTI, kidney stone, or any other abdominal etiology.  Supportive therapy indicated with return if symptoms worsen.  Patient counseled.  Final Clinical Impressions(s) / ED Diagnoses   Final diagnoses:  Chronic alcoholic gastritis without hemorrhage    ED Discharge Orders    None       Etta Quill, NP 06/26/18 0100    Merrily Pew, MD 06/26/18 7902

## 2018-06-25 NOTE — ED Notes (Signed)
ED Provider at bedside. 

## 2018-06-25 NOTE — ED Notes (Signed)
Date and time results received: 06/25/18 2207  Test: calcium Critical Value: 5.7  Name of Provider Notified: Etta Quill PA  Orders Received? Or Actions Taken?: no new orders

## 2018-06-25 NOTE — Discharge Instructions (Addendum)
Follow-up with your primary care provider as discussed.

## 2018-06-25 NOTE — ED Notes (Signed)
Pt requesting pain med / anxiety med. EDP informed.

## 2018-06-25 NOTE — ED Triage Notes (Signed)
Reports 15 episodes of vomiting which began today and 6 episodes of diarrhea.

## 2018-07-09 ENCOUNTER — Other Ambulatory Visit: Payer: Self-pay | Admitting: Family

## 2018-07-09 MED FILL — LORazepam 0.5 MG TABS: 0.5 | 10 days supply | Qty: 30 | Fill #0

## 2018-07-09 MED FILL — ZOLPIDEM TARTRATE 5 MG TAB: 5 | 30 days supply | Qty: 30 | Fill #2

## 2018-07-09 MED FILL — VALACYCLOVIR HCL 500 MG TAB: 500 | 90 days supply | Qty: 90 | Fill #0

## 2018-07-23 ENCOUNTER — Telehealth: Payer: Self-pay

## 2018-07-23 NOTE — Telephone Encounter (Signed)
Copied from [place] [id number]. Topic: Appointment Scheduling - Scheduling Inquiry for Clinic >> Jul 23, 2018  2:24 PM Alanda Slim E wrote: Reason for CRM: Pt wants to come in tomorrow due to pain and inflammation in the pelvic area constant for 2 weeks. There is a bulge that can be seen in the pelvic area that sticks out. Advised Pt of [facility] appts and he is open to it but thinks he needs Xrays /please advise

## 2018-07-23 NOTE — Telephone Encounter (Signed)
Patient was scheduled for virtual ov tomorrow at 9:20 am

## 2018-07-24 ENCOUNTER — Ambulatory Visit (INDEPENDENT_AMBULATORY_CARE_PROVIDER_SITE_OTHER): Payer: No Typology Code available for payment source | Admitting: Family

## 2018-07-24 ENCOUNTER — Encounter (HOSPITAL_BASED_OUTPATIENT_CLINIC_OR_DEPARTMENT_OTHER): Payer: Self-pay | Admitting: Adult Health

## 2018-07-24 ENCOUNTER — Emergency Department (HOSPITAL_BASED_OUTPATIENT_CLINIC_OR_DEPARTMENT_OTHER)
Admission: EM | Admit: 2018-07-24 | Discharge: 2018-07-24 | Disposition: A | Discharge status: Home / Self Care | Payer: No Typology Code available for payment source | Attending: Emergency Medicine | Admitting: Emergency Medicine

## 2018-07-24 ENCOUNTER — Other Ambulatory Visit: Payer: Self-pay

## 2018-07-24 ENCOUNTER — Telehealth: Payer: Self-pay | Admitting: Family

## 2018-07-24 DIAGNOSIS — E1165 Type 2 diabetes mellitus with hyperglycemia: Secondary | ICD-10-CM | POA: Insufficient documentation

## 2018-07-24 DIAGNOSIS — R3589 Other polyuria: Secondary | ICD-10-CM

## 2018-07-24 DIAGNOSIS — R1032 Left lower quadrant pain: Secondary | ICD-10-CM

## 2018-07-24 DIAGNOSIS — R197 Diarrhea, unspecified: Secondary | ICD-10-CM | POA: Diagnosis not present

## 2018-07-24 DIAGNOSIS — N529 Male erectile dysfunction, unspecified: Secondary | ICD-10-CM

## 2018-07-24 DIAGNOSIS — K4090 Unilateral inguinal hernia, without obstruction or gangrene, not specified as recurrent: Secondary | ICD-10-CM

## 2018-07-24 DIAGNOSIS — I10 Essential (primary) hypertension: Secondary | ICD-10-CM | POA: Insufficient documentation

## 2018-07-24 DIAGNOSIS — E119 Type 2 diabetes mellitus without complications: Secondary | ICD-10-CM | POA: Insufficient documentation

## 2018-07-24 DIAGNOSIS — R739 Hyperglycemia, unspecified: Secondary | ICD-10-CM

## 2018-07-24 DIAGNOSIS — Z87891 Personal history of nicotine dependence: Secondary | ICD-10-CM | POA: Insufficient documentation

## 2018-07-24 DIAGNOSIS — Z794 Long term (current) use of insulin: Secondary | ICD-10-CM | POA: Insufficient documentation

## 2018-07-24 DIAGNOSIS — R358 Other polyuria: Secondary | ICD-10-CM

## 2018-07-24 DIAGNOSIS — Z79899 Other long term (current) drug therapy: Secondary | ICD-10-CM | POA: Insufficient documentation

## 2018-07-24 LAB — BASIC METABOLIC PANEL
Anion gap: 8 (ref 5–15)
BUN: 16 mg/dL (ref 6–20)
CALCIUM: 9.2 mg/dL (ref 8.9–10.3)
CO2: 27 mmol/L (ref 22–32)
CREATININE: 1.15 mg/dL (ref 0.61–1.24)
Chloride: 94 mmol/L — ABNORMAL LOW (ref 98–111)
GFR calc Af Amer: >60 mL/min (ref >60)
GFR calc non Af Amer: >60 mL/min (ref >60)
Glucose, Bld: 257 mg/dL — ABNORMAL HIGH (ref 70–99)
Potassium: 4.2 mmol/L (ref 3.5–5.1)
Sodium: 129 mmol/L — ABNORMAL LOW (ref 135–145)

## 2018-07-24 LAB — CBG MONITORING, ED
Glucose-Capillary: 287 mg/dL — ABNORMAL HIGH (ref 70–99)
Glucose-Capillary: 221 mg/dL — ABNORMAL HIGH (ref 70–99)

## 2018-07-24 LAB — CBC
HCT: 39.6 % (ref 39.0–52.0)
Hemoglobin: 13.5 g/dL (ref 13.0–17.0)
MCH: 30.1 pg (ref 26.0–34.0)
MCHC: 34.1 g/dL (ref 30.0–36.0)
MCV: 88.2 fL (ref 80.0–100.0)
PLATELETS: 350 10*3/uL (ref 150–400)
RBC: 4.49 MIL/uL (ref 4.22–5.81)
RDW: 12 % (ref 11.5–15.5)
WBC: 7.4 10*3/uL (ref 4.0–10.5)
nRBC: 0 % (ref 0.0–0.2)

## 2018-07-24 LAB — URINALYSIS, MICROSCOPIC (REFLEX)
Squamous Epithelial / HPF: NONE SEEN (ref 0–5)
WBC, UA: NONE SEEN WBC/hpf (ref 0–5)

## 2018-07-24 LAB — URINALYSIS, ROUTINE W REFLEX MICROSCOPIC
Bilirubin Urine: NEGATIVE
Glucose, UA: >=500 mg/dL — AB
Hgb urine dipstick: NEGATIVE
Ketones, ur: NEGATIVE mg/dL
Leukocytes,Ua: NEGATIVE
Nitrite: NEGATIVE
PROTEIN: NEGATIVE mg/dL
SPECIFIC GRAVITY, URINE: 1.01 (ref 1.005–1.030)
pH: 6 (ref 5.0–8.0)

## 2018-07-24 MED ORDER — PANTOPRAZOLE SODIUM 40 MG PO TBEC: 40.0000 mg | DELAYED_RELEASE_TABLET | Freq: Two times a day (BID) | ORAL | 5 refills | Status: DC

## 2018-07-24 MED ORDER — SILDENAFIL CITRATE 20 MG PO TABS: ORAL_TABLET | ORAL | 0 refills | Status: DC

## 2018-07-24 MED ORDER — SODIUM CHLORIDE 0.9 % IV BOLUS
1000.0000 mL | Freq: Once | INTRAVENOUS | Status: AC
  Administered 2018-07-24: 1000 mL via INTRAVENOUS

## 2018-07-24 MED ORDER — BLOOD GLUCOSE MONITOR KIT: PACK | 0 refills | Status: AC

## 2018-07-24 MED ORDER — GLUCOSE BLOOD VI STRP: ORAL_STRIP | 12 refills | Status: AC

## 2018-07-24 MED ORDER — ESCITALOPRAM OXALATE 20 MG PO TABS: 20.0000 mg | ORAL_TABLET | Freq: Every day | ORAL | 1 refills | Status: DC

## 2018-07-24 MED ORDER — HYDROXYZINE HCL 25 MG PO TABS: 25.0000 mg | ORAL_TABLET | Freq: Once | ORAL | Status: DC

## 2018-07-24 MED ORDER — HYDROXYZINE HCL 10 MG/5ML PO SYRP
10.0000 mg | ORAL_SOLUTION | Freq: Once | ORAL | Status: AC
  Administered 2018-07-24: 10 mg via ORAL

## 2018-07-24 MED ORDER — HYDROXYZINE HCL 10 MG PO TABS
10.0000 mg | ORAL_TABLET | Freq: Once | ORAL | Status: DC
  Filled 2018-07-24: qty 1

## 2018-07-24 MED ORDER — INSULIN LISPRO (1 UNIT DIAL) 100 UNIT/ML (KWIKPEN): PEN_INJECTOR | SUBCUTANEOUS | 11 refills | Status: DC

## 2018-07-24 MED ORDER — HYDROXYZINE HCL 10 MG/5ML PO SYRP
ORAL_SOLUTION | ORAL | Status: AC
  Filled 2018-07-24: qty 1

## 2018-07-24 NOTE — ED Triage Notes (Signed)
Presents with 3 months of " a bulge in the left side of my groin" He reprots that he is able to reduce it. Endorses pain with ambulation. Denies nausea and vomiting.

## 2018-07-24 NOTE — Telephone Encounter (Signed)
Spoke to patient.  Refills sent.

## 2018-07-24 NOTE — ED Provider Notes (Signed)
[facility] EMERGENCY DEPARTMENT Provider Note   CSN: [id number] Arrival date & time: 07/24/18  1001    History   Chief Complaint Chief Complaint  Patient presents with  . Inguinal Hernia    HPI DIERKS WACH is a 43 y.o. male wth a hx of T2DM, EtOH abuse, hypercholesterolemia, HTN, and prior lower GI bleed who presents to the ED with concern for L groin pain for the past few months. He states there is an area of intermittent swelling/bulging to the L groin that is uncomfortable. States pains has become more persistent and seem to be worsening. Current pain is a 6/10 in severity, area of bulging remains intermittent and he can always push it back in. Worse with movement & heavy lifting. No alleviating factors. Denies fever, chills, nausea, nausea, or vomiting. He is an insulin dependent diabetic, states he ran out of test strips so he is unsure what his blood glucose has been running for the past 2 weeks. He notes he is also running low on insulin and took the last of his short acting this AM. Reports some polyuria & polydipsia.   He had an e-visit with his PCP this AM who recommended ER evaluation.   HPI  Past Medical History:  Diagnosis Date  . Alcohol abuse   . Alcoholic pancreatitis    recurrent  . Depression   . Diabetes mellitus, type II ([place])    New Onset 03/2010  . GERD (gastroesophageal reflux disease)   . High cholesterol   . 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 Hypertension   . Sleep apnea    does not wear a CPAP    Patient Active Problem List   Diagnosis Date Noted  . Alcohol-induced mood disorder ([place])   . DKA (diabetic ketoacidoses) ([place]) 07/28/2017  . Alcohol withdrawal ([place]) 07/28/2017  . Abnormal CT scan, colon   . Benign neoplasm of descending colon   . Lower GI bleed 07/04/2017  . Transaminitis 07/04/2017  . Pain   . Bleeding internal hemorrhoids   . Anemia of chronic disease   .  Left arm weakness   . Anemia, blood loss   . Vomiting alone 03/27/2017  . Chest pain 03/27/2017  . Nausea and vomiting   . Lactic acidosis 03/26/2017  . History of alcohol abuse 06/25/2016  . Tobacco abuse 06/25/2016  . Anxiety state 11/20/2015  . Dental infection 07/10/2015  . Mood disorder ([place]) 04/18/2015  . Eczema 06/27/2014  . GERD (gastroesophageal reflux disease) 05/24/2014  . Snoring 04/07/2013  . Suicide attempt by substance overdose ([place]) 02/10/2013  . Alcohol abuse 12/31/2012  . Erectile dysfunction 12/09/2012  . Hematuria 06/11/2012  . Vision problem 02/02/2012  . Insomnia 12/20/2011  . Chronic pancreatitis ([place]) 02/05/2011  . Depression with anxiety 11/13/2010  . Hyperlipemia 06/27/2010  . Diabetes type 2, uncontrolled ([place]) 05/22/2010  . Essential hypertension 05/22/2010    Past Surgical History:  Procedure Laterality Date  . COLONOSCOPY W/ BIOPSIES  09/2010   Dr. Ardis Hughs.  for intermittent rectal bleeding.  Mild sigmoid to descending diverticulosis.  Mild left colon erythema, benign biopsy, probably "prep effect"  . COLONOSCOPY WITH PROPOFOL N/A 07/06/2017   Procedure: COLONOSCOPY WITH PROPOFOL;  Surgeon: Jerene Bears, MD;  Location: [facility];  Service: Gastroenterology;  Laterality: N/A;  . LUMBAR MICRODISCECTOMY  ~ 2004   Dr Ellene Route        Home Medications  Prior to Admission medications   Medication Sig Start Date End Date Taking? Authorizing Provider  amLODipine (NORVASC) 10 MG tablet Take 1 tablet (10 mg total) by mouth daily. 03/20/18   Saguier, Percell Miller, PA-C  CIALIS 10 MG tablet TAKE 1 TABLET BY MOUTH DAILY AS NEEDED FOR ERECTILE DYSFUNCTION 07/09/18   Debbrah Alar, NP  cloNIDine (CATAPRES) 0.1 MG tablet Take 1 tablet (0.1 mg total) by mouth 3 (three) times daily. 03/20/18   Saguier, Percell Miller, PA-C  Continuous Blood Gluc Sensor (FREESTYLE LIBRE 14 DAY SENSOR) MISC Inject 1 each into the skin every 14 (fourteen) days. 04/27/18   Debbrah Alar, NP  cyclobenzaprine (FLEXERIL) 5 MG tablet Take 1 tablet (5 mg total) by mouth 2 (two) times daily as needed for muscle spasms. 11/24/17   Horton, Barbette Hair, MD  escitalopram (LEXAPRO) 20 MG tablet Take 1 tablet (20 mg total) by mouth daily. 03/20/18   Saguier, Percell Miller, PA-C  folic acid (FOLVITE) 1 MG tablet Take 1 tablet (1 mg total) by mouth daily. 08/29/17   Debbrah Alar, NP  glucose blood (FREESTYLE LITE) test strip Use as instructed 07/24/18   Debbrah Alar, NP  hydrOXYzine (ATARAX/VISTARIL) 50 MG tablet Take 1 tablet (50 mg total) by mouth 3 (three) times daily as needed. 04/27/18   Debbrah Alar, NP  Insulin Glargine (LANTUS SOLOSTAR) 100 UNIT/ML Solostar Pen Inject 25 Units into the skin at bedtime. 04/27/18   Debbrah Alar, NP  insulin lispro (HUMALOG KWIKPEN) 100 UNIT/ML KiwkPen 3 times a day (just before each meal) 09-10-08 units, Patient taking differently: Inject 5-15 Units into the skin See admin instructions. Inject 15 units into the skin with breakfast then 5 units with lunch then 15 units with dinner (evening meal) 02/27/17   Debbrah Alar, NP  insulin lispro (HUMALOG) 100 UNIT/ML cartridge Inject subcutaneously 3 times a day (just before each meal) 09-10-08 units 04/27/18   Debbrah Alar, NP  Insulin Pen Needle (ULTICARE SHORT PEN NEEDLES) 31G X 8 MM MISC Use once daily to inject insulin. 04/27/18   Debbrah Alar, NP  Lancets (FREESTYLE) lancets 2 (two) times daily 02/27/17   Debbrah Alar, NP  lisinopril (PRINIVIL,ZESTRIL) 20 MG tablet Take 1 tablet (20 mg total) by mouth daily. 03/20/18   Saguier, Percell Miller, PA-C  LORazepam (ATIVAN) 0.5 MG tablet Take 1 tablet (0.5 mg total) by mouth every 8 (eight) hours as needed for anxiety. Refill when due. 04/28/18   Debbrah Alar, NP  Multiple Vitamins-Minerals (MULTIVITAMIN) tablet Take 1 tablet by mouth daily. 02/12/16   Palumbo, April, MD  ondansetron (ZOFRAN ODT) 4 MG disintegrating  tablet 4mg  ODT q4 hours prn nausea/vomit 10/13/17   Malvin Johns, MD  pantoprazole (PROTONIX) 40 MG tablet Take 1 tablet (40 mg total) by mouth 2 (two) times daily. 03/20/18   Saguier, Percell Miller, PA-C  QUEtiapine (SEROQUEL) 300 MG tablet Take 1 tablet (300 mg total) by mouth at bedtime. 04/28/18   Debbrah Alar, NP  sildenafil (REVATIO) 20 MG tablet Take 1-2 tabs by mouth once daily as needed 30-60 minutes prior to sexual activity. 07/24/18   Debbrah Alar, NP  thiamine 100 MG tablet Take 1 tablet (100 mg total) by mouth daily. 03/20/18   Saguier, Percell Miller, PA-C  zolpidem (AMBIEN) 5 MG tablet Take 1 tablet (5 mg total) by mouth at bedtime as needed. for sleep 03/20/18   Saguier, Percell Miller, PA-C    Family History Family History  Problem Relation Age of Onset  . Diabetes Mother   . Lung cancer Brother  twin brother  . Pancreatic cancer Paternal Aunt   . Colon cancer Neg Hx   . Stomach cancer Neg Hx     Social History Social History   Tobacco Use  . Smoking status: Former Smoker    Packs/day: 0.25    Years: 1.00    Pack years: 0.25    Types: Cigarettes    Start date: 04/29/2016  . Smokeless tobacco: Never Used  . Tobacco comment: 4 Cigarettes a day   Substance Use Topics  . Alcohol use: Yes    Alcohol/week: 42.0 standard drinks    Types: 42 Cans of beer per week  . Drug use: Not Currently    Comment: none in years     Allergies   Invokamet [canagliflozin-metformin hcl] and Metformin and related   Review of Systems Review of Systems  Constitutional: Negative for chills and fever.  Respiratory: Negative for shortness of breath.   Cardiovascular: Negative for chest pain.  Gastrointestinal: Negative for anal bleeding, blood in stool, constipation, nausea and vomiting.       Positive for L groin pain.   Endocrine: Positive for polydipsia and polyuria.  Genitourinary: Negative for discharge, dysuria, scrotal swelling and testicular pain.  All other systems reviewed  and are negative.    Physical Exam Updated Vital Signs BP (!) 127/95 (BP Location: Right Arm)   Pulse (!) 109   Temp 98.4 F (36.9 C) (Oral)   Resp 18   Ht 6' (1.829 m)   Wt 97.5 kg   SpO2 100%   BMI 29.16 kg/m   Physical Exam Vitals signs and nursing note reviewed. Exam conducted with a chaperone present.  Constitutional:      General: He is not in acute distress.    Appearance: He is well-developed. He is not toxic-appearing.  HENT:     Head: Normocephalic and atraumatic.  Eyes:     General:        Right eye: No discharge.        Left eye: No discharge.     Conjunctiva/sclera: Conjunctivae normal.  Neck:     Musculoskeletal: Neck supple.  Cardiovascular:     Rate and Rhythm: Regular rhythm. Tachycardia present.  Pulmonary:     Effort: Pulmonary effort is normal. No respiratory distress.     Breath sounds: Normal breath sounds. No wheezing, rhonchi or rales.  Abdominal:     General: There is no distension.     Palpations: Abdomen is soft.     Tenderness: There is no guarding or rebound.    Genitourinary:    Penis: Normal and circumcised.      Scrotum/Testes: Normal. Cremasteric reflex is present.        Right: Mass, tenderness or swelling not present.        Left: Mass, tenderness or swelling not present.     Epididymis:     Right: Normal.     Left: Normal.  Skin:    General: Skin is warm and dry.     Findings: No rash.  Neurological:     Mental Status: He is alert.     Comments: Clear speech.   Psychiatric:        Behavior: Behavior normal.      ED Treatments / Results  Labs (all labs ordered are listed, but only abnormal results are displayed) Labs Reviewed  CBG MONITORING, ED - Abnormal; Notable for the following components:      Result Value   Glucose-Capillary 287 (*)  All other components within normal limits  URINALYSIS, ROUTINE W REFLEX MICROSCOPIC  BASIC METABOLIC PANEL  CBC    EKG None  Radiology No results found.   Procedures Procedures (including critical care time)  Medications Ordered in ED Medications - No data to display   Initial Impression / Assessment and Plan / ED Course  I have reviewed the triage vital signs and the nursing notes.  Pertinent labs & imaging results that were available during my care of the patient were reviewed by me and considered in my medical decision making (see chart for details).   Patient presents to the ER for evaluation of left groin discomfort as well as hyperglycemia.  Patient nontoxic-appearing, no apparent distress, mild tachycardia upon arrival resolved throughout ER stay.  Patient's left groin pain: No testicular pain/swelling, benign penile/testicular exam. L going area with possible palpable bulge/defect w/ bearing down concerning for inguinal hernia, but no signs of incarceration. Non surgical abdomen without peritoneal signs- general surgery follow up.   Patient's hyperglycemia: CBG elevated at 287. Basic labs obtained not consistent with DKA. No UTI on Urinalysis. Offered refills of his insulin/test strips- states he will call his PCP for this today. Discussed need for maintaining insulin regimen, checking CBG at home, and close PCP follow up.   Also of mention: Throughout ER stay patient complained of some anxiety, requested ativan, he was given atarax w/ improvement. He was also noted to be tachycardic upon arrival- seems consistent w/ prior HR on record w/ prior visits, patient was in the 90s upon my final assessment prior to discharge.   I discussed results, treatment plan, need for follow-up, and return precautions with the patient. Provided opportunity for questions, patient confirmed understanding and is in agreement with plan.     Findings and plan of care discussed with supervising physician Dr. Tyrone Nine who evaluated patient & is in agreement.   Final Clinical Impressions(s) / ED Diagnoses   Final diagnoses:  Hyperglycemia  Left inguinal pain     ED Discharge Orders    None       Amaryllis Dyke, PA-C 07/24/18 [place], DO 07/24/18 1523

## 2018-07-24 NOTE — Progress Notes (Signed)
Virtual Visit via Video Note  I connected with Willie [name] on 07/24/18 at  9:20 AM EDT by a video enabled telemedicine application and verified that I am speaking with the correct person using two identifiers.   I discussed the limitations of evaluation and management by telemedicine and the availability of in person appointments. The patient expressed understanding and agreed to proceed.   History of Present Illness:   Reports + swelling/inflammation/pain- has been constant.  Not swollen yet today "but it was last night." reports that he "used to be able to push it back up," but now he can no longer do this.  Reports lots of stool once an hour and urinating twice an hour.  Ran out of test strips. So not checking sugars. Notes + polyuria and dry mouth  Denies fever, cough/sob.    ED- reports inability to achieve any erection in 2 weeks.    Observations/Objective: Gen: Awake, alert, NAD- dry appearing lips Resp: breathing appears even and non-labored. Psych: call, pleasant affect Neuro: alert and oriented x 3.    Assessment and Plan: Inguinal hernia- notes constant pain, recommended ER evaluation  Diarrhea/polyuria- with his inability to check his sugar, polyuria and ongoing diarrhea, I am concerned that he is deyhydrated.  Recommended ED evaluation. Report was given to ED physician on duty at the [facility] ED in [facility].  ED- rx sent for sildenafil prn.   Follow Up Instructions:    I discussed the assessment and treatment plan with the patient. The patient was provided an opportunity to ask questions and all were answered. The patient agreed with the plan and demonstrated an understanding of the instructions.   The patient was advised to call back or seek an in-person evaluation if the symptoms worsen or if the condition fails to improve as anticipated.  I provided 15 minutes of non-face-to-face time during this encounter.   Nance Pear, NP

## 2018-07-24 NOTE — Discharge Instructions (Addendum)
You are seen in the emergency department today for groin pain and elevated blood sugar.  Your work-up in the emergency department was overall reassuring.  Your labs did not show findings consistent with DKA.  Please call your primary care provider today for refills of your medicines.  Please insulin as prescribed.  Check your blood sugars regularly.  Follow-up with primary care within 3 days for reevaluation of your diabetes.  Guarding her groin pain, we suspect that you may have a inguinal hernia.  We have provided information for general surgery for reassessment and further management of your potential hernia.  Please follow-up within 1 week.  Return to the ER for new or worsening symptoms including but not limited to fever, inability to keep fluids down, abdominal pain, increase in your groin pain, a bulge that cannot be pushed back in, or any other concerns.

## 2018-07-27 NOTE — Telephone Encounter (Signed)
PATIENT HAD VIRTUAL VISIT

## 2018-08-16 ENCOUNTER — Encounter: Payer: Self-pay | Admitting: Family

## 2018-08-16 ENCOUNTER — Other Ambulatory Visit: Payer: Self-pay | Admitting: Family

## 2018-08-16 MED ORDER — ESCITALOPRAM OXALATE 20 MG PO TABS: 20.0000 mg | ORAL_TABLET | Freq: Every day | ORAL | 0 refills | Status: DC

## 2018-08-16 MED ORDER — LISINOPRIL 20 MG PO TABS: 20.0000 mg | ORAL_TABLET | Freq: Every day | ORAL | 1 refills | Status: DC

## 2018-08-16 MED ORDER — CLONIDINE HCL 0.1 MG PO TABS: 0.1000 mg | ORAL_TABLET | Freq: Three times a day (TID) | ORAL | 1 refills | Status: DC

## 2018-08-16 MED ORDER — PANTOPRAZOLE SODIUM 40 MG PO TBEC: 40.0000 mg | DELAYED_RELEASE_TABLET | Freq: Two times a day (BID) | ORAL | 5 refills | Status: DC

## 2018-08-16 MED ORDER — INSULIN GLARGINE 100 UNIT/ML SOLOSTAR PEN: 25.0000 [IU] | PEN_INJECTOR | Freq: Every day | SUBCUTANEOUS | 0 refills | Status: DC

## 2018-08-16 MED ORDER — AMLODIPINE BESYLATE 10 MG PO TABS: 10.0000 mg | ORAL_TABLET | Freq: Every day | ORAL | 1 refills | Status: DC

## 2018-08-16 MED ORDER — QUETIAPINE FUMARATE 300 MG PO TABS: 300.0000 mg | ORAL_TABLET | Freq: Every day | ORAL | 0 refills | Status: DC

## 2018-08-16 NOTE — Telephone Encounter (Signed)
Willie [name], can you please look in the fridge and see if we have any lantus and if not what insulin do we have?

## 2018-08-17 ENCOUNTER — Encounter: Payer: Self-pay | Admitting: Family

## 2018-08-17 MED ORDER — PANTOPRAZOLE SODIUM 40 MG PO TBEC: 40.0000 mg | DELAYED_RELEASE_TABLET | Freq: Two times a day (BID) | ORAL | 5 refills | Status: DC

## 2018-08-17 MED ORDER — CLONIDINE HCL 0.1 MG PO TABS: 0.1000 mg | ORAL_TABLET | Freq: Three times a day (TID) | ORAL | 1 refills | Status: DC

## 2018-08-17 MED ORDER — INSULIN GLARGINE 100 UNIT/ML SOLOSTAR PEN: 25.0000 [IU] | PEN_INJECTOR | Freq: Every day | SUBCUTANEOUS | 0 refills | Status: DC

## 2018-08-17 MED ORDER — AMLODIPINE BESYLATE 10 MG PO TABS: 10.0000 mg | ORAL_TABLET | Freq: Every day | ORAL | 1 refills | Status: DC

## 2018-08-17 MED ORDER — QUETIAPINE FUMARATE 300 MG PO TABS: 300.0000 mg | ORAL_TABLET | Freq: Every day | ORAL | 0 refills | Status: DC

## 2018-08-17 MED ORDER — LISINOPRIL 20 MG PO TABS: 20.0000 mg | ORAL_TABLET | Freq: Every day | ORAL | 1 refills | Status: DC

## 2018-08-17 MED ORDER — ESCITALOPRAM OXALATE 20 MG PO TABS: 20.0000 mg | ORAL_TABLET | Freq: Every day | ORAL | 0 refills | Status: DC

## 2018-08-17 MED ORDER — LORAZEPAM 0.5 MG PO TABS: 0.5000 mg | ORAL_TABLET | Freq: Three times a day (TID) | ORAL | 0 refills | Status: DC | PRN

## 2018-08-17 MED ORDER — INSULIN LISPRO (1 UNIT DIAL) 100 UNIT/ML (KWIKPEN): PEN_INJECTOR | SUBCUTANEOUS | 11 refills | Status: DC

## 2018-08-17 NOTE — Telephone Encounter (Signed)
Refill Request: Lorazepam  Last RX:04/28/18 Last OV:07/24/18 Next OV: None scheduled  UDS:08/06/17 CSC:08/06/17 CSR:

## 2018-08-17 NOTE — Telephone Encounter (Signed)
I have printed / initiated pt assistance application and placed on PCP's desk for signature.

## 2018-08-18 MED ORDER — ZOLPIDEM TARTRATE 5 MG PO TABS: 5.0000 mg | ORAL_TABLET | Freq: Every evening | ORAL | 2 refills | Status: DC | PRN

## 2018-08-18 MED ORDER — HYDROXYZINE HCL 50 MG PO TABS: 50.0000 mg | ORAL_TABLET | Freq: Three times a day (TID) | ORAL | 1 refills | Status: DC | PRN

## 2018-08-18 NOTE — Addendum Note (Signed)
Addended by: Debbrah Alar on: 08/18/2018 03:32 PM   Modules accepted: Orders

## 2018-09-11 ENCOUNTER — Other Ambulatory Visit: Payer: Self-pay

## 2018-09-11 ENCOUNTER — Emergency Department (HOSPITAL_BASED_OUTPATIENT_CLINIC_OR_DEPARTMENT_OTHER): Payer: Self-pay

## 2018-09-11 ENCOUNTER — Encounter (HOSPITAL_BASED_OUTPATIENT_CLINIC_OR_DEPARTMENT_OTHER): Payer: Self-pay | Admitting: Adult Health

## 2018-09-11 ENCOUNTER — Inpatient Hospital Stay (HOSPITAL_BASED_OUTPATIENT_CLINIC_OR_DEPARTMENT_OTHER)
Admission: EM | Admit: 2018-09-11 | Discharge: 2018-09-15 | DRG: 433 | Disposition: A | Discharge status: Home / Self Care | Payer: Self-pay | Attending: Family Medicine | Admitting: Family Medicine

## 2018-09-11 DIAGNOSIS — D638 Anemia in other chronic diseases classified elsewhere: Secondary | ICD-10-CM | POA: Diagnosis present

## 2018-09-11 DIAGNOSIS — R112 Nausea with vomiting, unspecified: Secondary | ICD-10-CM | POA: Diagnosis present

## 2018-09-11 DIAGNOSIS — R109 Unspecified abdominal pain: Secondary | ICD-10-CM

## 2018-09-11 DIAGNOSIS — I10 Essential (primary) hypertension: Secondary | ICD-10-CM | POA: Diagnosis present

## 2018-09-11 DIAGNOSIS — Z1159 Encounter for screening for other viral diseases: Secondary | ICD-10-CM

## 2018-09-11 DIAGNOSIS — F10230 Alcohol dependence with withdrawal, uncomplicated: Secondary | ICD-10-CM

## 2018-09-11 DIAGNOSIS — E11649 Type 2 diabetes mellitus with hypoglycemia without coma: Secondary | ICD-10-CM | POA: Diagnosis present

## 2018-09-11 DIAGNOSIS — Z888 Allergy status to other drugs, medicaments and biological substances status: Secondary | ICD-10-CM

## 2018-09-11 DIAGNOSIS — Z915 Personal history of self-harm: Secondary | ICD-10-CM

## 2018-09-11 DIAGNOSIS — F10239 Alcohol dependence with withdrawal, unspecified: Secondary | ICD-10-CM | POA: Diagnosis not present

## 2018-09-11 DIAGNOSIS — Z79899 Other long term (current) drug therapy: Secondary | ICD-10-CM

## 2018-09-11 DIAGNOSIS — K7010 Alcoholic hepatitis without ascites: Principal | ICD-10-CM | POA: Diagnosis present

## 2018-09-11 DIAGNOSIS — K219 Gastro-esophageal reflux disease without esophagitis: Secondary | ICD-10-CM | POA: Diagnosis present

## 2018-09-11 DIAGNOSIS — E785 Hyperlipidemia, unspecified: Secondary | ICD-10-CM | POA: Diagnosis present

## 2018-09-11 DIAGNOSIS — Z9114 Patient's other noncompliance with medication regimen: Secondary | ICD-10-CM

## 2018-09-11 DIAGNOSIS — R739 Hyperglycemia, unspecified: Secondary | ICD-10-CM

## 2018-09-11 DIAGNOSIS — Z87891 Personal history of nicotine dependence: Secondary | ICD-10-CM

## 2018-09-11 DIAGNOSIS — G4733 Obstructive sleep apnea (adult) (pediatric): Secondary | ICD-10-CM | POA: Diagnosis present

## 2018-09-11 DIAGNOSIS — E876 Hypokalemia: Secondary | ICD-10-CM | POA: Diagnosis present

## 2018-09-11 DIAGNOSIS — K760 Fatty (change of) liver, not elsewhere classified: Secondary | ICD-10-CM | POA: Diagnosis present

## 2018-09-11 DIAGNOSIS — E860 Dehydration: Secondary | ICD-10-CM

## 2018-09-11 DIAGNOSIS — B179 Acute viral hepatitis, unspecified: Secondary | ICD-10-CM | POA: Diagnosis present

## 2018-09-11 DIAGNOSIS — F419 Anxiety disorder, unspecified: Secondary | ICD-10-CM | POA: Diagnosis present

## 2018-09-11 DIAGNOSIS — Z833 Family history of diabetes mellitus: Secondary | ICD-10-CM

## 2018-09-11 DIAGNOSIS — F1010 Alcohol abuse, uncomplicated: Secondary | ICD-10-CM | POA: Diagnosis present

## 2018-09-11 DIAGNOSIS — E872 Acidosis, unspecified: Secondary | ICD-10-CM | POA: Diagnosis present

## 2018-09-11 DIAGNOSIS — F10930 Alcohol use, unspecified with withdrawal, uncomplicated: Secondary | ICD-10-CM

## 2018-09-11 DIAGNOSIS — IMO0001 Reserved for inherently not codable concepts without codable children: Secondary | ICD-10-CM

## 2018-09-11 DIAGNOSIS — Z794 Long term (current) use of insulin: Secondary | ICD-10-CM

## 2018-09-11 LAB — URINALYSIS, MICROSCOPIC (REFLEX)
RBC / HPF: NONE SEEN RBC/hpf (ref 0–5)
WBC, UA: NONE SEEN WBC/hpf (ref 0–5)

## 2018-09-11 LAB — CBC
HCT: 39.2 % (ref 39.0–52.0)
Hemoglobin: 13.8 g/dL (ref 13.0–17.0)
MCH: 30 pg (ref 26.0–34.0)
MCHC: 35.2 g/dL (ref 30.0–36.0)
MCV: 85.2 fL (ref 80.0–100.0)
Platelets: 291 10*3/uL (ref 150–400)
RBC: 4.6 MIL/uL (ref 4.22–5.81)
RDW: 12.9 % (ref 11.5–15.5)
WBC: 8.3 10*3/uL (ref 4.0–10.5)
nRBC: 0 % (ref 0.0–0.2)

## 2018-09-11 LAB — POCT I-STAT EG7
Acid-Base Excess: 3 mmol/L — ABNORMAL HIGH (ref 0.0–2.0)
Bicarbonate: 27 mmol/L (ref 20.0–28.0)
Calcium, Ion: 1.08 mmol/L — ABNORMAL LOW (ref 1.15–1.40)
HCT: 46 % (ref 39.0–52.0)
Hemoglobin: 15.6 g/dL (ref 13.0–17.0)
O2 Saturation: 32 %
Patient temperature: 98.4
Potassium: 4.3 mmol/L (ref 3.5–5.1)
Sodium: 131 mmol/L — ABNORMAL LOW (ref 135–145)
TCO2: 28 mmol/L (ref 22–32)
pCO2, Ven: 39 mmHg — ABNORMAL LOW (ref 44.0–60.0)
pH, Ven: 7.447 — ABNORMAL HIGH (ref 7.250–7.430)
pO2, Ven: 19 mmHg — CL (ref 32.0–45.0)

## 2018-09-11 LAB — URINALYSIS, ROUTINE W REFLEX MICROSCOPIC
Bilirubin Urine: NEGATIVE
Glucose, UA: >=500 mg/dL — AB
Hgb urine dipstick: NEGATIVE
Ketones, ur: NEGATIVE mg/dL
Leukocytes,Ua: NEGATIVE
Nitrite: NEGATIVE
Protein, ur: NEGATIVE mg/dL
Specific Gravity, Urine: 1.025 (ref 1.005–1.030)
pH: 5.5 (ref 5.0–8.0)

## 2018-09-11 LAB — COMPREHENSIVE METABOLIC PANEL
ALT: 356 U/L — ABNORMAL HIGH (ref 0–44)
AST: 842 U/L — ABNORMAL HIGH (ref 15–41)
Albumin: 3.8 g/dL (ref 3.5–5.0)
Alkaline Phosphatase: 167 U/L — ABNORMAL HIGH (ref 38–126)
Anion gap: 21 — ABNORMAL HIGH (ref 5–15)
BUN: 9 mg/dL (ref 6–20)
CO2: 23 mmol/L (ref 22–32)
Calcium: 9.7 mg/dL (ref 8.9–10.3)
Chloride: 87 mmol/L — ABNORMAL LOW (ref 98–111)
Creatinine, Ser: 1.2 mg/dL (ref 0.61–1.24)
GFR calc Af Amer: >60 mL/min (ref >60)
GFR calc non Af Amer: >60 mL/min (ref >60)
Glucose, Bld: 349 mg/dL — ABNORMAL HIGH (ref 70–99)
Potassium: 4.5 mmol/L (ref 3.5–5.1)
Sodium: 131 mmol/L — ABNORMAL LOW (ref 135–145)
Total Bilirubin: 1.9 mg/dL — ABNORMAL HIGH (ref 0.3–1.2)
Total Protein: 8 g/dL (ref 6.5–8.1)

## 2018-09-11 LAB — LACTIC ACID, PLASMA
Lactic Acid, Venous: 10.3 mmol/L (ref 0.5–1.9)
Lactic Acid, Venous: 5.7 mmol/L (ref 0.5–1.9)
Lactic Acid, Venous: 2.9 mmol/L (ref 0.5–1.9)

## 2018-09-11 LAB — CBG MONITORING, ED: Glucose-Capillary: 312 mg/dL — ABNORMAL HIGH (ref 70–99)

## 2018-09-11 LAB — SARS CORONAVIRUS 2 AG (30 MIN TAT): SARS Coronavirus 2 Ag: NEGATIVE

## 2018-09-11 LAB — BASIC METABOLIC PANEL
Anion gap: 14 (ref 5–15)
BUN: 8 mg/dL (ref 6–20)
CO2: 25 mmol/L (ref 22–32)
Calcium: 8.4 mg/dL — ABNORMAL LOW (ref 8.9–10.3)
Chloride: 93 mmol/L — ABNORMAL LOW (ref 98–111)
Creatinine, Ser: 0.91 mg/dL (ref 0.61–1.24)
GFR calc Af Amer: >60 mL/min (ref >60)
GFR calc non Af Amer: >60 mL/min (ref >60)
Glucose, Bld: 263 mg/dL — ABNORMAL HIGH (ref 70–99)
Potassium: 3.9 mmol/L (ref 3.5–5.1)
Sodium: 132 mmol/L — ABNORMAL LOW (ref 135–145)

## 2018-09-11 LAB — LIPASE, BLOOD: Lipase: 19 U/L (ref 11–51)

## 2018-09-11 LAB — ETHANOL: Alcohol, Ethyl (B): <10 mg/dL (ref <10)

## 2018-09-11 MED ORDER — SODIUM CHLORIDE 0.9 % IV SOLN
INTRAVENOUS | Status: DC | PRN
  Administered 2018-09-11: 20:00:00 via INTRAVENOUS

## 2018-09-11 MED ORDER — THIAMINE HCL 100 MG/ML IJ SOLN
100.0000 mg | Freq: Every day | INTRAMUSCULAR | Status: DC
  Administered 2018-09-11 – 2018-09-13 (×3): 100 mg via INTRAVENOUS
  Filled 2018-09-11 (×3): qty 2

## 2018-09-11 MED ORDER — LORAZEPAM 2 MG/ML IJ SOLN
0.0000 mg | Freq: Four times a day (QID) | INTRAMUSCULAR | Status: DC
  Administered 2018-09-11 – 2018-09-12 (×2): 2 mg via INTRAVENOUS
  Filled 2018-09-11 (×2): qty 1

## 2018-09-11 MED ORDER — LORAZEPAM 1 MG PO TABS: 0.0000 mg | ORAL_TABLET | Freq: Four times a day (QID) | ORAL | Status: DC

## 2018-09-11 MED ORDER — MORPHINE SULFATE (PF) 4 MG/ML IV SOLN
4.0000 mg | Freq: Once | INTRAVENOUS | Status: AC
  Administered 2018-09-11: 20:00:00 4 mg via INTRAVENOUS
  Filled 2018-09-11: qty 1

## 2018-09-11 MED ORDER — LORAZEPAM 2 MG/ML IJ SOLN
2.0000 mg | Freq: Once | INTRAMUSCULAR | Status: AC
  Administered 2018-09-11: 18:00:00 2 mg via INTRAVENOUS
  Filled 2018-09-11: qty 1

## 2018-09-11 MED ORDER — SODIUM CHLORIDE 0.9% FLUSH
3.0000 mL | Freq: Once | INTRAVENOUS | Status: DC
  Filled 2018-09-11: qty 3

## 2018-09-11 MED ORDER — VITAMIN B-1 100 MG PO TABS
100.0000 mg | ORAL_TABLET | Freq: Every day | ORAL | Status: DC
  Administered 2018-09-14 – 2018-09-15 (×2): 100 mg via ORAL
  Filled 2018-09-11 (×2): qty 1

## 2018-09-11 MED ORDER — ONDANSETRON HCL 4 MG/2ML IJ SOLN
4.0000 mg | Freq: Once | INTRAMUSCULAR | Status: AC | PRN
  Administered 2018-09-11: 18:00:00 4 mg via INTRAVENOUS
  Filled 2018-09-11: qty 2

## 2018-09-11 MED ORDER — LORAZEPAM 2 MG/ML IJ SOLN: 0.0000 mg | Freq: Two times a day (BID) | INTRAMUSCULAR | Status: DC

## 2018-09-11 MED ORDER — SODIUM CHLORIDE 0.9 % IV BOLUS
1000.0000 mL | Freq: Once | INTRAVENOUS | Status: AC
  Administered 2018-09-11: 18:00:00 1000 mL via INTRAVENOUS

## 2018-09-11 MED ORDER — LACTATED RINGERS IV BOLUS
2000.0000 mL | Freq: Once | INTRAVENOUS | Status: AC
  Administered 2018-09-11: 19:00:00 2000 mL via INTRAVENOUS

## 2018-09-11 MED ORDER — PIPERACILLIN-TAZOBACTAM 3.375 G IVPB 30 MIN
3.3750 g | Freq: Once | INTRAVENOUS | Status: AC
  Administered 2018-09-11: 20:00:00 3.375 g via INTRAVENOUS
  Filled 2018-09-11 (×2): qty 50

## 2018-09-11 MED ORDER — INSULIN GLARGINE 100 UNIT/ML ~~LOC~~ SOLN
25.0000 [IU] | Freq: Once | SUBCUTANEOUS | Status: AC
  Administered 2018-09-11: 23:00:00 25 [IU] via SUBCUTANEOUS
  Filled 2018-09-11: qty 1

## 2018-09-11 MED ORDER — LORAZEPAM 1 MG PO TABS: 0.0000 mg | ORAL_TABLET | Freq: Two times a day (BID) | ORAL | Status: DC

## 2018-09-11 NOTE — ED Notes (Signed)
[facility] notified (Tammy) - patient ready for transport 

## 2018-09-11 NOTE — ED Notes (Signed)
ED TO INPATIENT HANDOFF REPORT  ED Nurse Name and Phone #: [phone] Garlon Hatchet  S Name/Age/Gender Willie [name] May 43 y.o. male Room/Bed: MH03/MH03  Code Status   Code Status: Prior  Home/SNF/Other DC home AO x 4   Triage Complete: Triage complete  Chief Complaint N/V, ETOH   Triage Note Presents with vomiting and generalized abdominal pain. He reports that gets this way everytime he stops drinking but he only drinks 3 beers twice a week. He reports that his last drink was last night at 6 pm. He began vomiting at 6 am this morning. HR 128. He is a diabetic and has not take any medciation for 2 days.    Allergies Allergies  Allergen Reactions  . Invokamet [Canagliflozin-Metformin Hcl] Other (See Comments)    Lactic acidosis  . Metformin And Related Other (See Comments)    DRASTIC drop in blood sugar    Level of Care/Admitting Diagnosis ED Disposition    ED Disposition Condition [facility]: [place] [[id number]]  Level of Care: Stepdown [14]  Admit to SDU based on following criteria: Other see comments  Comments: Lactate of 5.7 after IVF (was 10)  Covid Evaluation: Screening Protocol (No Symptoms)  Diagnosis: Acute hepatitis [725366]  Admitting Physician: Doreatha Massed  Attending Physician: Etta Quill [phone]  Estimated length of stay: past midnight tomorrow  Certification:: I certify this patient will need inpatient services for at least 2 midnights  PT Class (Do Not Modify): Inpatient [101]  PT Acc Code (Do Not Modify): Private [1]       B Medical/Surgery History Past Medical History:  Diagnosis Date  . Alcohol abuse   . Alcoholic pancreatitis    recurrent  . Depression   . Diabetes mellitus, type II ([place])    New [place]set 03/2010  . GERD (gastroesophageal reflux disease)   . High cholesterol   . 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 Hypertension    . Sleep apnea    does not wear a CPAP   Past Surgical History:  Procedure Laterality Date  . COLONOSCOPY W/ BIOPSIES  09/2010   Dr. Ardis Hughs.  for intermittent rectal bleeding.  Mild sigmoid to descending diverticulosis.  Mild left colon erythema, benign biopsy, probably "prep effect"  . COLONOSCOPY WITH PROPOFOL N/A 07/06/2017   Procedure: COLONOSCOPY WITH PROPOFOL;  Surgeon: Jerene Bears, MD;  Location: [facility];  Service: Gastroenterology;  Laterality: N/A;  . LUMBAR MICRODISCECTOMY  ~ 2004   Dr Ellene Route     A IV Location/Drains/Wounds Patient Lines/Drains/Airways Status   Active Line/Drains/Airways    Name:   Placement date:   Placement time:   Site:   Days:   Peripheral IV 09/11/18 Right Antecubital   09/11/18    1740    Antecubital   less than 1          Intake/Output Last 24 hours  Intake/Output Summary (Last 24 hours) at 09/11/2018 2242 Last data filed at 09/11/2018 2149 Gross per 24 hour  Intake 2040.21 ml  Output -  Net 2040.21 ml    Labs/Imaging Results for orders placed or performed during the hospital encounter of 09/11/18 (from the past 48 hour(s))  POC CBG, ED     Status: Abnormal   Collection Time: 09/11/18  5:46 PM  Result Value Ref Range   Glucose-Capillary 312 (H) 70 - 99 mg/dL  Lipase,  blood     Status: None   Collection Time: 09/11/18  5:50 PM  Result Value Ref Range   Lipase 19 11 - 51 U/L    Comment: Performed at [facility], [address] [place], [place] [address]  Comprehensive metabolic panel     Status: Abnormal   Collection Time: 09/11/18  5:50 PM  Result Value Ref Range   Sodium 131 (L) 135 - 145 mmol/L   Potassium 4.5 3.5 - 5.1 mmol/L   Chloride 87 (L) 98 - 111 mmol/L   CO2 23 22 - 32 mmol/L   Glucose, Bld 349 (H) 70 - 99 mg/dL   BUN 9 6 - 20 mg/dL   Creatinine, Ser 1.20 0.61 - 1.24 mg/dL   Calcium 9.7 8.9 - 10.3 mg/dL   Total Protein 8.0 6.5 - 8.1 g/dL   Albumin 3.8 3.5 - 5.0 g/dL   AST 842 (H) 15 - 41 U/L   ALT  356 (H) 0 - 44 U/L   Alkaline Phosphatase 167 (H) 38 - 126 U/L   Total Bilirubin 1.9 (H) 0.3 - 1.2 mg/dL   GFR calc non Af Amer >60 >60 mL/min   GFR calc Af Amer >60 >60 mL/min   Anion gap 21 (H) 5 - 15    Comment: Performed at [facility], [address] [place], [place] [address]  CBC     Status: None   Collection Time: 09/11/18  5:50 PM  Result Value Ref Range   WBC 8.3 4.0 - 10.5 K/uL   RBC 4.60 4.22 - 5.81 MIL/uL   Hemoglobin 13.8 13.0 - 17.0 g/dL   HCT 39.2 39.0 - 52.0 %   MCV 85.2 80.0 - 100.0 fL   MCH 30.0 26.0 - 34.0 pg   MCHC 35.2 30.0 - 36.0 g/dL   RDW 12.9 11.5 - 15.5 %   Platelets 291 150 - 400 K/uL   nRBC 0.0 0.0 - 0.2 %    Comment: Performed at [facility], [address] [place], [place] [address]  Urinalysis, Routine w reflex microscopic     Status: Abnormal   Collection Time: 09/11/18  5:50 PM  Result Value Ref Range   Color, Urine YELLOW YELLOW   APPearance CLEAR CLEAR   Specific Gravity, Urine 1.025 1.005 - 1.030   pH 5.5 5.0 - 8.0   Glucose, UA >=500 (A) NEGATIVE mg/dL   Hgb urine dipstick NEGATIVE NEGATIVE   Bilirubin Urine NEGATIVE NEGATIVE   Ketones, ur NEGATIVE NEGATIVE mg/dL   Protein, ur NEGATIVE NEGATIVE mg/dL   Nitrite NEGATIVE NEGATIVE   Leukocytes,Ua NEGATIVE NEGATIVE    Comment: Performed at [facility], [address] [place], [place] [address]  Ethanol     Status: None   Collection Time: 09/11/18  5:50 PM  Result Value Ref Range   Alcohol, Ethyl (B) <10 <10 mg/dL    Comment: (NOTE) Lowest detectable limit for serum alcohol is 10 mg/dL. For medical purposes only. Performed at [facility], [address] [place], [place] [address]   Lactic acid, plasma     Status: Abnormal   Collection Time: 09/11/18  5:50 PM  Result Value Ref Range   Lactic Acid, Venous 10.3 (HH) 0.5 - 1.9 mmol/L    Comment: CRITICAL RESULT CALLED TO, READ BACK BY AND VERIFIED WITH: AMY HARTLEY RN AT 1816 ON 09/11/18  BY I.SUGUT Performed at [facility],  [address] [place], [place] [address]   Urinalysis, Microscopic (reflex)     Status: Abnormal   Collection Time: 09/11/18  5:50 PM  Result Value Ref Range   RBC / HPF NONE SEEN 0 - 5 RBC/hpf   WBC, UA NONE SEEN 0 - 5 WBC/hpf   Bacteria, UA RARE (A) NONE SEEN   Squamous Epithelial / LPF 0-5 0 - 5    Comment: Performed at [facility], [address] [place], [place] [address]  POCT I-Stat EG7     Status: Abnormal   Collection Time: 09/11/18  5:54 PM  Result Value Ref Range   pH, Ven 7.447 (H) 7.250 - 7.430   pCO2, Ven 39.0 (L) 44.0 - 60.0 mmHg   pO2, Ven 19.0 (LL) 32.0 - 45.0 mmHg   Bicarbonate 27.0 20.0 - 28.0 mmol/L   TCO2 28 22 - 32 mmol/L   O2 Saturation 32.0 %   Acid-Base Excess 3.0 (H) 0.0 - 2.0 mmol/L   Sodium 131 (L) 135 - 145 mmol/L   Potassium 4.3 3.5 - 5.1 mmol/L   Calcium, Ion 1.08 (L) 1.15 - 1.40 mmol/L   HCT 46.0 39.0 - 52.0 %   Hemoglobin 15.6 13.0 - 17.0 g/dL   Patient temperature 98.4 F    Collection site IV START    Drawn by Operator    Sample type VENOUS    Comment NOTIFIED PHYSICIAN   Culture, blood (routine x 2)     Status: None (Preliminary result)   Collection Time: 09/11/18  7:20 PM  Result Value Ref Range   Specimen Description      BLOOD RIGHT HAND Performed at [facility], [address], [place], [place] [address]    Special Requests      BOTTLES DRAWN AEROBIC ONLY Blood Culture results may not be optimal due to an inadequate volume of blood received in culture bottles Performed at [facility], [address] [place], [place] [address]    Culture PENDING    Report Status PENDING   Basic metabolic panel     Status: Abnormal   Collection Time: 09/11/18  8:26 PM  Result Value Ref Range   Sodium 132 (L) 135 - 145 mmol/L   Potassium 3.9 3.5 - 5.1 mmol/L   Chloride 93 (L) 98 - 111 mmol/L   CO2 25 22 - 32 mmol/L   Glucose, Bld 263 (H) 70 - 99 mg/dL   BUN 8 6 -  20 mg/dL   Creatinine, Ser 0.91 0.61 - 1.24 mg/dL   Calcium 8.4 (L) 8.9 - 10.3 mg/dL   GFR calc non Af Amer >60 >60 mL/min   GFR calc Af Amer >60 >60 mL/min   Anion gap 14 5 - 15    Comment: Performed at [facility], [address] [place], [place] [address]  Lactic acid, plasma     Status: Abnormal   Collection Time: 09/11/18  8:35 PM  Result Value Ref Range   Lactic Acid, Venous 5.7 (HH) 0.5 - 1.9 mmol/L    Comment: CRITICAL RESULT CALLED TO, READ BACK BY AND VERIFIED WITH: Jaci Carrel RN AT 2055 ON 09/11/18 BY I.SUGUT Performed at [facility], [address] [place], [place] [address]   SARS Coronavirus 2 (Hosp order,Performed in [place] lab via [employer])     Status: None   Collection Time: 09/11/18  9:54 PM  Result Value Ref  Range   SARS Coronavirus 2 ([employer] Now) NEGATIVE NEGATIVE    Comment: (NOTE) Interpretive Result Comment(s): COVID 19 Positive SARS CoV 2 target nucleic acids are DETECTED. The SARS CoV 2 RNA is generally detectable in upper and lower respiratory specimens during the acute phase of infection.  Positive results are indicative of active infection with SARS CoV 2.  Clinical correlation with patient history and other diagnostic information is necessary to determine patient infection status.  Positive results do not rule out bacterial infection or coinfection with other viruses. The expected result is Negative. COVID 19 Negative SARS CoV 2 target nucleic acids are NOT DETECTED. The SARS CoV 2 RNA is generally detectable in upper and lower respiratory specimens during the acute phase of infection.  Negative results do not preclude SARS CoV 2 infection, do not rule out coinfections with other pathogens, and should not be used as the sole basis for treatment or other patient management decisions.  Negative results must be combined with clinical  observations, patient history, and epidemiological information. The expected  result is Negative. Invalid Presence or absence of SARS CoV 2 nucleic acids cannot be determined. Repeat testing was performed on the submitted specimen and repeated Invalid results were obtained.  If clinically indicated, additional testing on a new specimen with an alternate test methodology [phone]) is advised.  The SARS CoV 2 RNA is generally detectable in upper and lower respiratory specimens during the acute phase of infection. The expected result is Negative. Fact Sheet for Patients:  GolfingFamily.no Fact Sheet for Healthcare Providers: https://www.hernandez-brewer.com/ This test is not yet approved or cleared by the [place] FDA and has been authorized for detection and/or diagnosis of SARS CoV 2 by FDA under an Emergency Use Authorization (EUA).  This EUA will remain in effect (meaning this test can be used) for the duration of the COVID19 d eclaration under Section 564(b)(1) of the Act, 21 U.S.C. section [phone] 3(b)(1), unless the authorization is terminated or revoked sooner. Performed at [facility], [address] [place], [place] [address]    US Abdomen Limited Ruq  Result Date: 09/11/2018 CLINICAL DATA:  Right upper quadrant pain with nausea and vomiting EXAM: ULTRASOUND ABDOMEN LIMITED RIGHT UPPER QUADRANT COMPARISON:  None. FINDINGS: Gallbladder: No gallstones or wall thickening visualized. There is no pericholecystic fluid. No sonographic Murphy sign noted by sonographer. Common bile duct: Diameter: 3 mm. No intrahepatic or extrahepatic biliary duct dilatation. Liver: There is a cyst in the anterior segment right lobe of the liver measuring 0.7 x 0.8 x 0.8 cm. The echogenicity of the liver overall is increased. Within normal limits in parenchymal echogenicity. Portal vein is patent on color Doppler imaging with normal direction of blood flow towards the liver. There is trace ascites. IMPRESSION: 1. Small liver cyst. No  other focal liver lesions identified. Note that the liver demonstrates increased echogenicity, felt to represent underlying hepatic steatosis. The sensitivity of ultrasound for detection of noncystic liver lesions is somewhat diminished in this circumstance. 2.  Trace ascites adjacent to the liver. 3. No demonstrable gallbladder pathology. No evident biliary duct dilatation Electronically Signed   By: Lowella Grip III M.D.   On: 09/11/2018 21:00    Pending Labs Unresulted Labs (From admission, onward)    Start     Ordered   09/11/18 2026  Lactic acid, plasma  Now then every 2 hours,   STAT     09/11/18 2026   09/11/18 1842  Culture, blood (routine  x 2)  BLOOD CULTURE X 2,   STAT     09/11/18 1842          Vitals/Pain Today's Vitals   09/11/18 2000 09/11/18 2010 09/11/18 2130 09/11/18 2230  BP: (!) 134/91  (!) 136/91 (!) 138/93  Pulse: (!) 105  (!) 103 (!) 106  Resp: 18  11 18   Temp:      TempSrc:      SpO2: 100%  100% 99%  Weight:      Height:      PainSc:  8       Isolation Precautions No active isolations  Medications Medications  sodium chloride flush (NS) 0.9 % injection 3 mL (3 mLs Intravenous Not Given 09/11/18 1757)  LORazepam (ATIVAN) injection 0-4 mg (2 mg Intravenous Given 09/11/18 2151)    Or  LORazepam (ATIVAN) tablet 0-4 mg ( Oral See Alternative 09/11/18 2151)  LORazepam (ATIVAN) injection 0-4 mg (has no administration in time range)    Or  LORazepam (ATIVAN) tablet 0-4 mg (has no administration in time range)  thiamine (VITAMIN B-1) tablet 100 mg ( Oral See Alternative 09/11/18 1835)    Or  thiamine (B-1) injection 100 mg (100 mg Intravenous Given 09/11/18 1835)  0.9 %  sodium chloride infusion ( Intravenous New Bag/Given 09/11/18 1930)  insulin glargine (LANTUS) injection 25 Units (has no administration in time range)  ondansetron (ZOFRAN) injection 4 mg (4 mg Intravenous Given 09/11/18 1746)  sodium chloride 0.9 % bolus 1,000 mL (0 mLs Intravenous Stopped  09/11/18 1842)  LORazepam (ATIVAN) injection 2 mg (2 mg Intravenous Given 09/11/18 1759)  lactated ringers bolus 2,000 mL ( Intravenous Stopped 09/11/18 2126)  piperacillin-tazobactam (ZOSYN) IVPB 3.375 g ( Intravenous Stopped 09/11/18 2001)  morphine 4 MG/ML injection 4 mg (4 mg Intravenous Given 09/11/18 2010)    Mobility Self care Low fall risk   Focused Assessments AO x 4, [place] 18 2 mg Ativan given last 7 hours.   R Recommendations: See Admitting Provider Note  Report given to:   Additional Notes:

## 2018-09-11 NOTE — ED Notes (Signed)
Pt on monitor 

## 2018-09-11 NOTE — ED Notes (Signed)
Critical value received Lactic acid 5.7. Dr Rex Kras aware.

## 2018-09-11 NOTE — ED Provider Notes (Signed)
[facility] DEPARTMENT Provider Note   CSN: [id number] Arrival date & time: 09/11/18  1702    History   Chief Complaint Chief Complaint  Patient presents with   Emesis    HPI Willie [name] is a 43 y.o. male.     43yo M w/ PMH including IDDM, alcohol abuse, alcoholic pancreatitis, GERD, HLD, HTN, OSA who p/w vomiting and abdominal pain. Pt reports his last drink of alcohol was 6pm last night; he normally drinks ~5-10 shots of liquor daily and reports h/o alcohol withdrawal but no withdrawal seizures. This morning around 6am, he began having vomiting associated w/ generalized abdominal pain. He reports that he has had similar symptoms previously when he tried to stop drinking. He endorses tremulousness and sweating. No cough, fever, diarrhea, or breathing problems. No urinary symptoms. Pt reports he has not taken insulin in 2 days and does not check BG at home.  The history is provided by the patient.  Emesis    Past Medical History:  Diagnosis Date   Alcohol abuse    Alcoholic pancreatitis    recurrent   Depression    Diabetes mellitus, type II ([place])    New Onset 03/2010   GERD (gastroesophageal reflux disease)    High cholesterol    History of low back pain    with herniated disc L5 S1 with right lumbar radiculopathy   Hypertension    Sleep apnea    does not wear a CPAP    Patient Active Problem List   Diagnosis Date Noted   Alcohol-induced mood disorder ([place])    DKA (diabetic ketoacidoses) ([place]) 07/28/2017   Alcohol withdrawal ([place]) 07/28/2017   Abnormal CT scan, colon    Benign neoplasm of descending colon    Lower GI bleed 07/04/2017   Transaminitis 07/04/2017   Pain    Bleeding internal hemorrhoids    Anemia of chronic disease    Left arm weakness    Anemia, blood loss    Vomiting alone 03/27/2017   Chest pain 03/27/2017   Nausea and vomiting    Lactic acidosis 03/26/2017   History of alcohol abuse  06/25/2016   Tobacco abuse 06/25/2016   Anxiety state 11/20/2015   Dental infection 07/10/2015   Mood disorder ([place]) 04/18/2015   Eczema 06/27/2014   GERD (gastroesophageal reflux disease) 05/24/2014   Snoring 04/07/2013   Suicide attempt by substance overdose ([place]) 02/10/2013   Alcohol abuse 12/31/2012   Erectile dysfunction 12/09/2012   Hematuria 06/11/2012   Vision problem 02/02/2012   Insomnia 12/20/2011   Chronic pancreatitis ([place]) 02/05/2011   Depression with anxiety 11/13/2010   Hyperlipemia 06/27/2010   Diabetes type 2, uncontrolled ([place]) 05/22/2010   Essential hypertension 05/22/2010    Past Surgical History:  Procedure Laterality Date   COLONOSCOPY W/ BIOPSIES  09/2010   Dr. Ardis Hughs.  for intermittent rectal bleeding.  Mild sigmoid to descending diverticulosis.  Mild left colon erythema, benign biopsy, probably "prep effect"   COLONOSCOPY WITH PROPOFOL N/A 07/06/2017   Procedure: COLONOSCOPY WITH PROPOFOL;  Surgeon: Jerene Bears, MD;  Location: [facility];  Service: Gastroenterology;  Laterality: N/A;   LUMBAR MICRODISCECTOMY  ~ 2004   Dr Ellene Route        Home Medications    Prior to Admission medications   Medication Sig Start Date End Date Taking? Authorizing Provider  amLODipine (NORVASC) 10 MG tablet Take 1 tablet (10 mg total) by mouth daily. 08/17/18   Debbrah Alar, NP  blood glucose  meter kit and supplies KIT Dispense based on patient and insurance preference. Use up to four times daily as directed. (FOR ICD-9 250.00, 250.01). 07/24/18   Debbrah Alar, NP  CIALIS 10 MG tablet TAKE 1 TABLET BY MOUTH DAILY AS NEEDED FOR ERECTILE DYSFUNCTION 07/09/18   Debbrah Alar, NP  cloNIDine (CATAPRES) 0.1 MG tablet Take 1 tablet (0.1 mg total) by mouth 3 (three) times daily. 08/17/18   Debbrah Alar, NP  Continuous Blood Gluc Sensor (FREESTYLE LIBRE 14 DAY SENSOR) MISC Inject 1 each into the skin every 14 (fourteen) days. 04/27/18    Debbrah Alar, NP  cyclobenzaprine (FLEXERIL) 5 MG tablet Take 1 tablet (5 mg total) by mouth 2 (two) times daily as needed for muscle spasms. 11/24/17   Horton, Barbette Hair, MD  escitalopram (LEXAPRO) 20 MG tablet Take 1 tablet (20 mg total) by mouth daily. 08/17/18   Debbrah Alar, NP  folic acid (FOLVITE) 1 MG tablet Take 1 tablet (1 mg total) by mouth daily. 08/29/17   Debbrah Alar, NP  glucose blood (FREESTYLE LITE) test strip Use as instructed 07/24/18   Debbrah Alar, NP  hydrOXYzine (ATARAX/VISTARIL) 50 MG tablet Take 1 tablet (50 mg total) by mouth 3 (three) times daily as needed. 08/18/18   Debbrah Alar, NP  Insulin Glargine (LANTUS SOLOSTAR) 100 UNIT/ML Solostar Pen Inject 25 Units into the skin at bedtime. 08/17/18   Debbrah Alar, NP  insulin lispro (HUMALOG KWIKPEN) 100 UNIT/ML KwikPen Inject subcutaneously 3 times a day (just before each meal) 09-10-08 units 08/17/18   Debbrah Alar, NP  Insulin Pen Needle (ULTICARE SHORT PEN NEEDLES) 31G X 8 MM MISC Use once daily to inject insulin. 04/27/18   Debbrah Alar, NP  Lancets (FREESTYLE) lancets 2 (two) times daily 02/27/17   Debbrah Alar, NP  lisinopril (ZESTRIL) 20 MG tablet Take 1 tablet (20 mg total) by mouth daily. 08/17/18   Debbrah Alar, NP  LORazepam (ATIVAN) 0.5 MG tablet Take 1 tablet (0.5 mg total) by mouth every 8 (eight) hours as needed for anxiety. Refill when due. 08/17/18   Debbrah Alar, NP  Multiple Vitamins-Minerals (MULTIVITAMIN) tablet Take 1 tablet by mouth daily. 02/12/16   Palumbo, April, MD  ondansetron (ZOFRAN ODT) 4 MG disintegrating tablet 55m ODT q4 hours prn nausea/vomit 10/13/17   BMalvin Johns MD  pantoprazole (PROTONIX) 40 MG tablet Take 1 tablet (40 mg total) by mouth 2 (two) times daily. 08/17/18   ODebbrah Alar NP  QUEtiapine (SEROQUEL) 300 MG tablet Take 1 tablet (300 mg total) by mouth at bedtime. 08/17/18   ODebbrah Alar NP    sildenafil (REVATIO) 20 MG tablet Take 1-2 tabs by mouth once daily as needed 30-60 minutes prior to sexual activity. 07/24/18   ODebbrah Alar NP  thiamine 100 MG tablet Take 1 tablet (100 mg total) by mouth daily. 03/20/18   Saguier, EPercell Miller PA-C  zolpidem (AMBIEN) 5 MG tablet Take 1 tablet (5 mg total) by mouth at bedtime as needed. for sleep 08/18/18   ODebbrah Alar NP    Family History Family History  Problem Relation Age of Onset   Diabetes Mother    Lung cancer Brother        twin brother   Pancreatic cancer Paternal Aunt    Colon cancer Neg Hx    Stomach cancer Neg Hx     Social History Social History   Tobacco Use   Smoking status: Former Smoker    Packs/day: 0.25    Years: 1.00  Pack years: 0.25    Types: Cigarettes    Start date: 04/29/2016   Smokeless tobacco: Never Used   Tobacco comment: 4 Cigarettes a day   Substance Use Topics   Alcohol use: Yes    Alcohol/week: 42.0 standard drinks    Types: 42 Cans of beer per week   Drug use: Not Currently    Comment: none in years     Allergies   Invokamet [canagliflozin-metformin hcl] and Metformin and related   Review of Systems Review of Systems  Gastrointestinal: Positive for vomiting.   All other systems reviewed and are negative except that which was mentioned in HPI   Physical Exam Updated Vital Signs BP (!) 122/107    Pulse (!) 124    Temp 98.4 F (36.9 C) (Oral)    Resp 18    Ht 6' (1.829 m)    Wt 77.1 kg    SpO2 100%    BMI 23.06 kg/m   Physical Exam Vitals signs and nursing note reviewed.  Constitutional:      General: He is in acute distress.     Appearance: He is well-developed. He is diaphoretic.     Comments: Tremulous, distressed, anxious  HENT:     Head: Normocephalic and atraumatic.     Mouth/Throat:     Mouth: Mucous membranes are dry.  Eyes:     Conjunctiva/sclera: Conjunctivae normal.     Pupils: Pupils are equal, round, and reactive to light.  Neck:      Musculoskeletal: Neck supple.  Cardiovascular:     Rate and Rhythm: Regular rhythm. Tachycardia present.     Heart sounds: Normal heart sounds. No murmur.  Pulmonary:     Effort: Pulmonary effort is normal.     Breath sounds: Normal breath sounds.  Abdominal:     General: Bowel sounds are normal. There is no distension.     Palpations: Abdomen is soft.     Comments: Mild generalized TTP without focal tenderness  Musculoskeletal:     Right lower leg: No edema.     Left lower leg: No edema.  Skin:    General: Skin is warm.  Neurological:     Mental Status: He is alert and oriented to person, place, and time.     Comments: Fluent speech, following commands, tremulous  Psychiatric:     Comments: Anxious, upset      ED Treatments / Results  Labs (all labs ordered are listed, but only abnormal results are displayed) Labs Reviewed  COMPREHENSIVE METABOLIC PANEL - Abnormal; Notable for the following components:      Result Value   Sodium 131 (*)    Chloride 87 (*)    Glucose, Bld 349 (*)    AST 842 (*)    ALT 356 (*)    Alkaline Phosphatase 167 (*)    Total Bilirubin 1.9 (*)    Anion gap 21 (*)    All other components within normal limits  URINALYSIS, ROUTINE W REFLEX MICROSCOPIC - Abnormal; Notable for the following components:   Glucose, UA >=500 (*)    All other components within normal limits  LACTIC ACID, PLASMA - Abnormal; Notable for the following components:   Lactic Acid, Venous 10.3 (*)    All other components within normal limits  LACTIC ACID, PLASMA - Abnormal; Notable for the following components:   Lactic Acid, Venous 5.7 (*)    All other components within normal limits  URINALYSIS, MICROSCOPIC (REFLEX) - Abnormal; Notable for the  following components:   Bacteria, UA RARE (*)    All other components within normal limits  BASIC METABOLIC PANEL - Abnormal; Notable for the following components:   Sodium 132 (*)    Chloride 93 (*)    Glucose, Bld 263 (*)     Calcium 8.4 (*)    All other components within normal limits  CBG MONITORING, ED - Abnormal; Notable for the following components:   Glucose-Capillary 312 (*)    All other components within normal limits  POCT I-STAT EG7 - Abnormal; Notable for the following components:   pH, Ven 7.447 (*)    pCO2, Ven 39.0 (*)    pO2, Ven 19.0 (*)    Acid-Base Excess 3.0 (*)    Sodium 131 (*)    Calcium, Ion 1.08 (*)    All other components within normal limits  CULTURE, BLOOD (ROUTINE X 2)  SARS CORONAVIRUS 2 (HOSP ORDER, PERFORMED IN [facility] VIA [facility] ID)  CULTURE, BLOOD (ROUTINE X 2)  LIPASE, BLOOD  CBC  ETHANOL  LACTIC ACID, PLASMA  LACTIC ACID, PLASMA  I-STAT VENOUS BLOOD GAS, ED  CBG MONITORING, ED    EKG EKG Interpretation  Date/Time:  Friday Sep 11 2018 17:47:52 EDT Ventricular Rate:  116 PR Interval:    QRS Duration: 88 QT Interval:  340 QTC Calculation: 473 R Axis:   76 Text Interpretation:  Sinus tachycardia Probable left atrial enlargement Baseline wander in lead(s) V3 similar to previous Confirmed by Theotis Burrow [phone]) on 09/11/2018 5:55:59 PM   Radiology US Abdomen Limited Ruq  Result Date: 09/11/2018 CLINICAL DATA:  Right upper quadrant pain with nausea and vomiting EXAM: ULTRASOUND ABDOMEN LIMITED RIGHT UPPER QUADRANT COMPARISON:  None. FINDINGS: Gallbladder: No gallstones or wall thickening visualized. There is no pericholecystic fluid. No sonographic Murphy sign noted by sonographer. Common bile duct: Diameter: 3 mm. No intrahepatic or extrahepatic biliary duct dilatation. Liver: There is a cyst in the anterior segment right lobe of the liver measuring 0.7 x 0.8 x 0.8 cm. The echogenicity of the liver overall is increased. Within normal limits in parenchymal echogenicity. Portal vein is patent on color Doppler imaging with normal direction of blood flow towards the liver. There is trace ascites. IMPRESSION: 1. Small liver cyst. No other focal liver lesions  identified. Note that the liver demonstrates increased echogenicity, felt to represent underlying hepatic steatosis. The sensitivity of ultrasound for detection of noncystic liver lesions is somewhat diminished in this circumstance. 2.  Trace ascites adjacent to the liver. 3. No demonstrable gallbladder pathology. No evident biliary duct dilatation Electronically Signed   By: Lowella Grip III M.D.   On: 09/11/2018 21:00    Procedures .Critical Care Performed by: Sharlett Iles, MD Authorized by: Sharlett Iles, MD   Critical care provider statement:    Critical care time (minutes):  30   Critical care was necessary to treat or prevent imminent or life-threatening deterioration of the following conditions:  Dehydration and toxidrome   Critical care was time spent personally by me on the following activities:  Development of treatment plan with patient or surrogate, evaluation of patient's response to treatment, examination of patient, obtaining history from patient or surrogate, ordering and performing treatments and interventions, ordering and review of laboratory studies, ordering and review of radiographic studies and re-evaluation of patient's condition   (including critical care time)  Medications Ordered in ED Medications  sodium chloride flush (NS) 0.9 % injection 3 mL (has no administration in  time range)  ondansetron (ZOFRAN) injection 4 mg (has no administration in time range)  sodium chloride 0.9 % bolus 1,000 mL (has no administration in time range)  LORazepam (ATIVAN) injection 2 mg (has no administration in time range)  LORazepam (ATIVAN) injection 0-4 mg (has no administration in time range)    Or  LORazepam (ATIVAN) tablet 0-4 mg (has no administration in time range)  LORazepam (ATIVAN) injection 0-4 mg (has no administration in time range)    Or  LORazepam (ATIVAN) tablet 0-4 mg (has no administration in time range)  thiamine (VITAMIN B-1) tablet 100 mg  (has no administration in time range)    Or  thiamine (B-1) injection 100 mg (has no administration in time range)     Initial Impression / Assessment and Plan / ED Course  I have reviewed the triage vital signs and the nursing notes.  Pertinent labs & imaging results that were available during my care of the patient were reviewed by me and considered in my medical decision making (see chart for details).       PT was tachycardic, hypertensive, tremulous on exam. Afebrile. Concern for acute alcohol withdrawal, gave ativan and IVF bolus. Also concerned for hyperglycemia, at risk for DKA given vomiting and medication non-compliance.   LAbs show initial lactate 10, Na 131, Cr 1.2, AST 842, ALT 356, tbili 1.9, AG 21. Normal CBC and UA without ketones. Normal pH on VBG. I suspect acidosis is due to dehydration and I doubt sepsis; but it is of DKA.  After 3 fluid boluses, the patient's lactate has improved to 5.7.  He is received several rounds of Ativan for tachycardia and symptoms of acute alcohol withdrawal.  Obtained right upper quadrant ultrasound and initially gave Zosyn wend patient's lab work showed severely elevated LFTs.  Ultrasound is negative for acute gallbladder pathology, hepatic steatosis.  Recommended admission for continued hydration and treatment of life-threatening alcohol withdrawal symptoms.  Discussed admission with [facility] hospitalist at [facility], Dr. Alcario Drought, and patient will be transferred for further care.  Final Clinical Impressions(s) / ED Diagnoses   Final diagnoses:  None    ED Discharge Orders    None       Axten Pascucci, Wenda Overland, MD 09/11/18 2338

## 2018-09-11 NOTE — ED Triage Notes (Signed)
Presents with vomiting and generalized abdominal pain. He reports that gets this way everytime he stops drinking but he only drinks 3 beers twice a week. He reports that his last drink was last night at 6 pm. He began vomiting at 6 am this morning. HR 128. He is a diabetic and has not take any medciation for 2 days.

## 2018-09-11 NOTE — ED Notes (Signed)
Pt very hard stick unable to get second set of blood culture, pt has 2 IV access at this time with minimal to none blood return on it.

## 2018-09-12 DIAGNOSIS — E860 Dehydration: Secondary | ICD-10-CM

## 2018-09-12 DIAGNOSIS — F10230 Alcohol dependence with withdrawal, uncomplicated: Secondary | ICD-10-CM

## 2018-09-12 DIAGNOSIS — F1010 Alcohol abuse, uncomplicated: Secondary | ICD-10-CM

## 2018-09-12 DIAGNOSIS — Z794 Long term (current) use of insulin: Secondary | ICD-10-CM

## 2018-09-12 DIAGNOSIS — IMO0001 Reserved for inherently not codable concepts without codable children: Secondary | ICD-10-CM

## 2018-09-12 DIAGNOSIS — B179 Acute viral hepatitis, unspecified: Secondary | ICD-10-CM

## 2018-09-12 DIAGNOSIS — I10 Essential (primary) hypertension: Secondary | ICD-10-CM

## 2018-09-12 DIAGNOSIS — E872 Acidosis: Secondary | ICD-10-CM

## 2018-09-12 DIAGNOSIS — E119 Type 2 diabetes mellitus without complications: Secondary | ICD-10-CM

## 2018-09-12 LAB — COMPREHENSIVE METABOLIC PANEL
ALT: 240 U/L — ABNORMAL HIGH (ref 0–44)
AST: 504 U/L — ABNORMAL HIGH (ref 15–41)
Albumin: 2.8 g/dL — ABNORMAL LOW (ref 3.5–5.0)
Alkaline Phosphatase: 117 U/L (ref 38–126)
Anion gap: 10 (ref 5–15)
BUN: 6 mg/dL (ref 6–20)
CO2: 26 mmol/L (ref 22–32)
Calcium: 8.3 mg/dL — ABNORMAL LOW (ref 8.9–10.3)
Chloride: 98 mmol/L (ref 98–111)
Creatinine, Ser: 0.74 mg/dL (ref 0.61–1.24)
GFR calc Af Amer: >60 mL/min (ref >60)
GFR calc non Af Amer: >60 mL/min (ref >60)
Glucose, Bld: 84 mg/dL (ref 70–99)
Potassium: 3.1 mmol/L — ABNORMAL LOW (ref 3.5–5.1)
Sodium: 134 mmol/L — ABNORMAL LOW (ref 135–145)
Total Bilirubin: 1.7 mg/dL — ABNORMAL HIGH (ref 0.3–1.2)
Total Protein: 6 g/dL — ABNORMAL LOW (ref 6.5–8.1)

## 2018-09-12 LAB — GLUCOSE, CAPILLARY
Glucose-Capillary: 142 mg/dL — ABNORMAL HIGH (ref 70–99)
Glucose-Capillary: 75 mg/dL (ref 70–99)
Glucose-Capillary: 323 mg/dL — ABNORMAL HIGH (ref 70–99)
Glucose-Capillary: 226 mg/dL — ABNORMAL HIGH (ref 70–99)
Glucose-Capillary: 219 mg/dL — ABNORMAL HIGH (ref 70–99)
Glucose-Capillary: 97 mg/dL (ref 70–99)

## 2018-09-12 LAB — CBC
HCT: 31 % — ABNORMAL LOW (ref 39.0–52.0)
Hemoglobin: 11 g/dL — ABNORMAL LOW (ref 13.0–17.0)
MCH: 31 pg (ref 26.0–34.0)
MCHC: 35.5 g/dL (ref 30.0–36.0)
MCV: 87.3 fL (ref 80.0–100.0)
Platelets: 196 10*3/uL (ref 150–400)
RBC: 3.55 MIL/uL — ABNORMAL LOW (ref 4.22–5.81)
RDW: 13.3 % (ref 11.5–15.5)
WBC: 5.9 10*3/uL (ref 4.0–10.5)
nRBC: 0 % (ref 0.0–0.2)

## 2018-09-12 LAB — MRSA PCR SCREENING: MRSA by PCR: NEGATIVE

## 2018-09-12 LAB — PROTIME-INR
INR: 1.1 (ref 0.8–1.2)
Prothrombin Time: 14.2 seconds (ref 11.4–15.2)

## 2018-09-12 MED ORDER — INSULIN GLARGINE 100 UNIT/ML ~~LOC~~ SOLN: 15.0000 [IU] | Freq: Every day | SUBCUTANEOUS | Status: DC

## 2018-09-12 MED ORDER — ACETAMINOPHEN 325 MG PO TABS
650.0000 mg | ORAL_TABLET | Freq: Four times a day (QID) | ORAL | Status: DC | PRN
  Administered 2018-09-12: 21:00:00 650 mg via ORAL
  Filled 2018-09-12: qty 2

## 2018-09-12 MED ORDER — MORPHINE SULFATE (PF) 2 MG/ML IV SOLN
2.0000 mg | Freq: Once | INTRAVENOUS | Status: AC
  Administered 2018-09-12: 03:00:00 2 mg via INTRAVENOUS
  Filled 2018-09-12: qty 1

## 2018-09-12 MED ORDER — INSULIN GLARGINE 100 UNIT/ML ~~LOC~~ SOLN: 20.0000 [IU] | Freq: Every day | SUBCUTANEOUS | Status: DC

## 2018-09-12 MED ORDER — CLONIDINE HCL 0.1 MG PO TABS
0.1000 mg | ORAL_TABLET | Freq: Three times a day (TID) | ORAL | Status: DC
  Administered 2018-09-12 – 2018-09-15 (×8): 0.1 mg via ORAL
  Filled 2018-09-12 (×9): qty 1

## 2018-09-12 MED ORDER — ONDANSETRON HCL 4 MG/2ML IJ SOLN
4.0000 mg | Freq: Once | INTRAMUSCULAR | Status: AC
  Administered 2018-09-12: 4 mg via INTRAVENOUS
  Filled 2018-09-12: qty 2

## 2018-09-12 MED ORDER — POTASSIUM CHLORIDE 10 MEQ/100ML IV SOLN
10.0000 meq | INTRAVENOUS | Status: AC
  Administered 2018-09-12 (×2): 10 meq via INTRAVENOUS
  Filled 2018-09-12 (×2): qty 100

## 2018-09-12 MED ORDER — LORAZEPAM 2 MG/ML IJ SOLN
2.0000 mg | INTRAMUSCULAR | Status: DC | PRN
  Administered 2018-09-12 – 2018-09-13 (×3): 2 mg via INTRAVENOUS
  Filled 2018-09-12 (×3): qty 1

## 2018-09-12 MED ORDER — DEXTROSE-NACL 5-0.45 % IV SOLN
INTRAVENOUS | Status: DC
  Administered 2018-09-12 – 2018-09-14 (×4): via INTRAVENOUS

## 2018-09-12 MED ORDER — ACETAMINOPHEN 650 MG RE SUPP: 650.0000 mg | Freq: Four times a day (QID) | RECTAL | Status: DC | PRN

## 2018-09-12 MED ORDER — QUETIAPINE FUMARATE 100 MG PO TABS
300.0000 mg | ORAL_TABLET | Freq: Every day | ORAL | Status: DC
  Administered 2018-09-12 – 2018-09-14 (×3): 300 mg via ORAL
  Filled 2018-09-12 (×2): qty 3
  Filled 2018-09-12: qty 6

## 2018-09-12 MED ORDER — ESCITALOPRAM OXALATE 20 MG PO TABS
20.0000 mg | ORAL_TABLET | Freq: Every day | ORAL | Status: DC
  Administered 2018-09-12 – 2018-09-15 (×4): 20 mg via ORAL
  Filled 2018-09-12 (×4): qty 1

## 2018-09-12 MED ORDER — ONDANSETRON HCL 4 MG/2ML IJ SOLN
4.0000 mg | Freq: Four times a day (QID) | INTRAMUSCULAR | Status: DC | PRN
  Administered 2018-09-12 – 2018-09-13 (×2): 4 mg via INTRAVENOUS
  Filled 2018-09-12 (×2): qty 2

## 2018-09-12 MED ORDER — SODIUM CHLORIDE 0.9 % IV SOLN: INTRAVENOUS | Status: DC

## 2018-09-12 MED ORDER — CHLORHEXIDINE GLUCONATE CLOTH 2 % EX PADS
6.0000 | MEDICATED_PAD | Freq: Every day | CUTANEOUS | Status: DC
  Administered 2018-09-12 – 2018-09-15 (×4): 6 via TOPICAL

## 2018-09-12 MED ORDER — INSULIN ASPART 100 UNIT/ML ~~LOC~~ SOLN
0.0000 [IU] | SUBCUTANEOUS | Status: DC
  Administered 2018-09-12: 21:00:00 3 [IU] via SUBCUTANEOUS
  Administered 2018-09-12: 12:00:00 7 [IU] via SUBCUTANEOUS
  Administered 2018-09-12: 15:00:00 3 [IU] via SUBCUTANEOUS
  Administered 2018-09-13: 5 [IU] via SUBCUTANEOUS
  Administered 2018-09-13: 12:00:00 1 [IU] via SUBCUTANEOUS
  Administered 2018-09-13: 20:00:00 5 [IU] via SUBCUTANEOUS
  Administered 2018-09-13: 08:00:00 3 [IU] via SUBCUTANEOUS
  Administered 2018-09-13: 17:00:00 5 [IU] via SUBCUTANEOUS
  Administered 2018-09-14: 09:00:00 1 [IU] via SUBCUTANEOUS
  Administered 2018-09-14: 3 [IU] via SUBCUTANEOUS
  Administered 2018-09-14: 16:00:00 5 [IU] via SUBCUTANEOUS
  Administered 2018-09-14 (×2): 2 [IU] via SUBCUTANEOUS
  Administered 2018-09-15: 12:00:00 9 [IU] via SUBCUTANEOUS
  Administered 2018-09-15: 3 [IU] via SUBCUTANEOUS

## 2018-09-12 MED ORDER — AMLODIPINE BESYLATE 10 MG PO TABS: 10.0000 mg | ORAL_TABLET | Freq: Every day | ORAL | Status: DC

## 2018-09-12 MED ORDER — ENOXAPARIN SODIUM 40 MG/0.4ML ~~LOC~~ SOLN
40.0000 mg | SUBCUTANEOUS | Status: DC
  Administered 2018-09-12 – 2018-09-15 (×4): 40 mg via SUBCUTANEOUS
  Filled 2018-09-12 (×4): qty 0.4

## 2018-09-12 MED ORDER — PANTOPRAZOLE SODIUM 40 MG PO TBEC: 40.0000 mg | DELAYED_RELEASE_TABLET | Freq: Two times a day (BID) | ORAL | Status: DC

## 2018-09-12 MED ORDER — ADULT MULTIVITAMIN W/MINERALS CH
1.0000 | ORAL_TABLET | Freq: Every day | ORAL | Status: DC
  Administered 2018-09-12 – 2018-09-15 (×3): 1 via ORAL
  Filled 2018-09-12 (×3): qty 1

## 2018-09-12 MED ORDER — LIP MEDEX EX OINT
TOPICAL_OINTMENT | CUTANEOUS | Status: AC
  Administered 2018-09-12: 1
  Filled 2018-09-12: qty 7

## 2018-09-12 MED ORDER — FOLIC ACID 1 MG PO TABS
1.0000 mg | ORAL_TABLET | Freq: Every day | ORAL | Status: DC
  Administered 2018-09-12 – 2018-09-15 (×3): 1 mg via ORAL
  Filled 2018-09-12 (×3): qty 1

## 2018-09-12 MED ORDER — ONDANSETRON HCL 4 MG PO TABS: 4.0000 mg | ORAL_TABLET | Freq: Four times a day (QID) | ORAL | Status: DC | PRN

## 2018-09-12 MED ORDER — PANTOPRAZOLE SODIUM 40 MG IV SOLR
40.0000 mg | Freq: Two times a day (BID) | INTRAVENOUS | Status: DC
  Administered 2018-09-12 – 2018-09-14 (×5): 40 mg via INTRAVENOUS
  Filled 2018-09-12 (×5): qty 40

## 2018-09-12 MED ORDER — MORPHINE SULFATE (PF) 4 MG/ML IV SOLN
4.0000 mg | Freq: Once | INTRAVENOUS | Status: AC
  Administered 2018-09-12: 01:00:00 4 mg via INTRAVENOUS
  Filled 2018-09-12: qty 1

## 2018-09-12 MED ORDER — MORPHINE SULFATE (PF) 2 MG/ML IV SOLN
2.0000 mg | INTRAVENOUS | Status: DC | PRN
  Administered 2018-09-12: 09:00:00 2 mg via INTRAVENOUS
  Administered 2018-09-13: 05:00:00 3 mg via INTRAVENOUS
  Administered 2018-09-13: 2 mg via INTRAVENOUS
  Administered 2018-09-14: 02:00:00 4 mg via INTRAVENOUS
  Filled 2018-09-12: qty 2
  Filled 2018-09-12: qty 1
  Filled 2018-09-12: qty 2
  Filled 2018-09-12: qty 1

## 2018-09-12 MED ORDER — ONDANSETRON HCL 4 MG/2ML IJ SOLN
4.0000 mg | Freq: Once | INTRAMUSCULAR | Status: AC | PRN
  Administered 2018-09-12: 03:00:00 4 mg via INTRAVENOUS
  Filled 2018-09-12: qty 2

## 2018-09-12 NOTE — ED Notes (Signed)
Critical Lactic acid received 2.9. Dr Rex Kras aware.

## 2018-09-12 NOTE — Progress Notes (Signed)
Subjective: Patient admitted this morning, see detailed H&P by Dr. Alcario Drought 43 year old male with a history of diabetes mellitus, alcohol abuse, hypertension, recurrent alcoholic pancreatitis came to ED with complaints of nausea vomiting abdominal pain.  Patient was admitted with alcoholic hepatitis.  Started on CIWA protocol for alcohol withdrawal.  Vitals:   09/12/18 1300 09/12/18 1400  BP: (!) 149/106 127/87  Pulse: (!) 102 91  Resp: 14 18  Temp:    SpO2: 100% 83%      A/P Alcoholic hepatitis Intractable nausea vomiting Alcohol withdrawal Hypoglycemia Diabetes mellitus  We will switch IV normal saline to D5 half-normal saline at 75 mL/h. Switch CIWA protocol to stepdown CIWA protocol Discontinue Lantus at this time Continue sliding scale insulin with NovoLog.    [facility] Hospitalist [phone]

## 2018-09-12 NOTE — H&P (Addendum)
History and Physical    Willie [name] ZOX:[id number] DOB: 1975-09-04 DOA: 09/11/2018  PCP: Debbrah Alar, NP  Patient coming from: Home  I have personally briefly reviewed patient's old medical records in [place]  Chief Complaint: Emesis  HPI: Willie [name] is a 43 y.o. male with medical history significant of IDDM, EtOH abuse, HTN, recurrent EtOH pancreatitis.  Patient presents to the ED at [facility] with c/o N/V and abd pain.  Last drink of alcohol was 6pm last night; he normally drinks ~5-10 shots of liquor daily and reports h/o alcohol withdrawal but no withdrawal seizures.  This morning around 6AM, began having vomiting, generalized abd pain.  Also having tremulousness that he associates with withdrawal symptoms.  Symptoms are severe and persistent.  No cough, fever, diarrhea, SOB.  Hasnt taken insulin in 2 days and doesn't check his BGL.  Does have h/o DKA in the past it seems.   ED Course: No DKA, but does have lactic acidosis with initial lactate of 10.x, improves to 5.7 then 2.9 after IVF.  Put on ativan for withdrawal.  CMP significant for AST 842, ALT 356, nl lipase, BGL 349 initially.  Got 25u lantus while in ED.  No keytones in urine.  BAL <10.  [place] RUQ shows steatosis of liver, no specific gallbladder disease seen.  Single small liver cyst.   Review of Systems: As per HPI otherwise 10 point review of systems negative.   Past Medical History:  Diagnosis Date  . Alcohol abuse   . Alcoholic pancreatitis    recurrent  . Depression   . Diabetes mellitus, type II ([place])    New Onset 03/2010  . GERD (gastroesophageal reflux disease)   . High cholesterol   . 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 Hypertension   . Sleep apnea    does not wear a CPAP    Past Surgical History:  Procedure Laterality Date  . COLONOSCOPY W/ BIOPSIES  09/2010   Dr. Ardis Hughs.  for intermittent rectal bleeding.  Mild  sigmoid to descending diverticulosis.  Mild left colon erythema, benign biopsy, probably "prep effect"  . COLONOSCOPY WITH PROPOFOL N/A 07/06/2017   Procedure: COLONOSCOPY WITH PROPOFOL;  Surgeon: Jerene Bears, MD;  Location: [facility];  Service: Gastroenterology;  Laterality: N/A;  . LUMBAR MICRODISCECTOMY  ~ 2004   Dr Ellene Route     reports that he has quit smoking. His smoking use included cigarettes. He started smoking about 2 years ago. He has a 0.25 pack-year smoking history. He has never used smokeless tobacco. He reports current alcohol use of about 42.0 standard drinks of alcohol per week. He reports previous drug use.  Allergies  Allergen Reactions  . Invokamet [Canagliflozin-Metformin Hcl] Other (See Comments)    Lactic acidosis  . Metformin And Related Other (See Comments)    DRASTIC drop in blood sugar    Family History  Problem Relation Age of Onset  . Diabetes Mother   . Lung cancer Brother        twin brother  . Pancreatic cancer Paternal Aunt   . Colon cancer Neg Hx   . Stomach cancer Neg Hx      Prior to Admission medications   Medication Sig Start Date End Date Taking? Authorizing Provider  amLODipine (NORVASC) 10 MG tablet Take 1 tablet (10 mg total) by mouth daily. 08/17/18  Yes Debbrah Alar, NP  cloNIDine (CATAPRES) 0.1 MG  tablet Take 1 tablet (0.1 mg total) by mouth 3 (three) times daily. 08/17/18  Yes Debbrah Alar, NP  escitalopram (LEXAPRO) 20 MG tablet Take 1 tablet (20 mg total) by mouth daily. 08/17/18  Yes Debbrah Alar, NP  folic acid (FOLVITE) 1 MG tablet Take 1 tablet (1 mg total) by mouth daily. 08/29/17  Yes Debbrah Alar, NP  hydrOXYzine (ATARAX/VISTARIL) 50 MG tablet Take 1 tablet (50 mg total) by mouth 3 (three) times daily as needed. Patient taking differently: Take 50 mg by mouth 3 (three) times daily as needed for anxiety.  08/18/18  Yes Debbrah Alar, NP  Insulin Glargine (LANTUS SOLOSTAR) 100 UNIT/ML Solostar Pen  Inject 25 Units into the skin at bedtime. 08/17/18  Yes Debbrah Alar, NP  insulin lispro (HUMALOG KWIKPEN) 100 UNIT/ML KwikPen Inject subcutaneously 3 times a day (just before each meal) 09-10-08 units Patient taking differently: 5-15 Units See admin instructions. Inject subcutaneously 3 times a day (just before each meal) 09-10-08 units 08/17/18  Yes Debbrah Alar, NP  lisinopril (ZESTRIL) 20 MG tablet Take 1 tablet (20 mg total) by mouth daily. 08/17/18  Yes Debbrah Alar, NP  LORazepam (ATIVAN) 0.5 MG tablet Take 1 tablet (0.5 mg total) by mouth every 8 (eight) hours as needed for anxiety. Refill when due. 08/17/18  Yes Debbrah Alar, NP  Multiple Vitamins-Minerals (MULTIVITAMIN) tablet Take 1 tablet by mouth daily. 02/12/16  Yes Palumbo, April, MD  ondansetron (ZOFRAN ODT) 4 MG disintegrating tablet 95m ODT q4 hours prn nausea/vomit Patient taking differently: Take 4 mg by mouth every 4 (four) hours as needed for nausea or vomiting.  10/13/17  Yes BMalvin Johns MD  pantoprazole (PROTONIX) 40 MG tablet Take 1 tablet (40 mg total) by mouth 2 (two) times daily. 08/17/18  Yes ODebbrah Alar NP  QUEtiapine (SEROQUEL) 300 MG tablet Take 1 tablet (300 mg total) by mouth at bedtime. 08/17/18  Yes ODebbrah Alar NP  thiamine 100 MG tablet Take 1 tablet (100 mg total) by mouth daily. 03/20/18  Yes Saguier, EPercell Miller PA-C  zolpidem (AMBIEN) 5 MG tablet Take 1 tablet (5 mg total) by mouth at bedtime as needed. for sleep 08/18/18  Yes ODebbrah Alar NP  blood glucose meter kit and supplies KIT Dispense based on patient and insurance preference. Use up to four times daily as directed. (FOR ICD-9 250.00, 250.01). 07/24/18   ODebbrah Alar NP  CIALIS 10 MG tablet TAKE 1 TABLET BY MOUTH DAILY AS NEEDED FOR ERECTILE DYSFUNCTION Patient taking differently: Take 10 mg by mouth daily as needed for erectile dysfunction.  07/09/18   ODebbrah Alar NP  Continuous Blood Gluc  Sensor (FREESTYLE LIBRE 14 DAY SENSOR) MISC Inject 1 each into the skin every 14 (fourteen) days. 04/27/18   ODebbrah Alar NP  cyclobenzaprine (FLEXERIL) 5 MG tablet Take 1 tablet (5 mg total) by mouth 2 (two) times daily as needed for muscle spasms. 11/24/17   Horton, CBarbette Hair MD  glucose blood (FREESTYLE LITE) test strip Use as instructed 07/24/18   ODebbrah Alar NP  Insulin Pen Needle (ULTICARE SHORT PEN NEEDLES) 31G X 8 MM MISC Use once daily to inject insulin. 04/27/18   ODebbrah Alar NP  Lancets (FREESTYLE) lancets 2 (two) times daily 02/27/17   ODebbrah Alar NP  sildenafil (REVATIO) 20 MG tablet Take 1-2 tabs by mouth once daily as needed 30-60 minutes prior to sexual activity. 07/24/18   ODebbrah Alar NP    Physical Exam: Vitals:   09/11/18 2130 09/11/18 2230 09/12/18 0030 09/12/18  0204  BP: (!) 136/91 (!) 138/93 138/87 (!) 165/105  Pulse: (!) 103 (!) 106 (!) 102 (!) 103  Resp: _0 Temp:    98.8 F (37.1 C)  TempSrc:    Oral  SpO2: 100% 99% 100% 99%  Weight:    80.2 kg  Height:    6' (1.829 m)    Constitutional: NAD, calm, comfortable Eyes: PERRL, lids and conjunctivae normal ENMT: Mucous membranes are moist. Posterior pharynx clear of any exudate or lesions.Normal dentition.  Neck: normal, supple, no masses, no thyromegaly Respiratory: clear to auscultation bilaterally, no wheezing, no crackles. Normal respiratory effort. No accessory muscle use.  Cardiovascular: Regular rate and rhythm, no murmurs / rubs / gallops. No extremity edema. 2+ pedal pulses. No carotid bruits.  Abdomen: Mild generalized TTP Musculoskeletal: no clubbing / cyanosis. No joint deformity upper and lower extremities. Good ROM, no contractures. Normal muscle tone.  Skin: no rashes, lesions, ulcers. No induration Neurologic: CN 2-12 grossly intact. Sensation intact, DTR normal. Strength 5/5 in all 4.  Psychiatric: Normal judgment and insight. Alert and oriented x  3. Normal mood.    Labs on Admission: I have personally reviewed following labs and imaging studies  CBC: Recent Labs  Lab 09/11/18 1750 09/11/18 1754  WBC 8.3  --   HGB 13.8 15.6  HCT 39.2 46.0  MCV 85.2  --   PLT 291  --    Basic Metabolic Panel: Recent Labs  Lab 09/11/18 1750 09/11/18 1754 09/11/18 2026  NA 131* 131* 132*  K 4.5 4.3 3.9  CL 87*  --  93*  CO2 23  --  25  GLUCOSE 349*  --  263*  BUN 9  --  8  CREATININE 1.20  --  0.91  CALCIUM 9.7  --  8.4*   GFR: Estimated Creatinine Clearance: 117.3 mL/min (by C-G formula based on SCr of 0.91 mg/dL). Liver Function Tests: Recent Labs  Lab 09/11/18 1750  AST 842*  ALT 356*  ALKPHOS 167*  BILITOT 1.9*  PROT 8.0  ALBUMIN 3.8   Recent Labs  Lab 09/11/18 1750  LIPASE 19   No results for input(s): AMMONIA in the last 168 hours. Coagulation Profile: No results for input(s): INR, PROTIME in the last 168 hours. Cardiac Enzymes: No results for input(s): CKTOTAL, CKMB, CKMBINDEX, TROPONINI in the last 168 hours. BNP (last 3 results) No results for input(s): PROBNP in the last 8760 hours. HbA1C: No results for input(s): HGBA1C in the last 72 hours. CBG: Recent Labs  Lab 09/11/18 1746  GLUCAP 312*   Lipid Profile: No results for input(s): CHOL, HDL, LDLCALC, TRIG, CHOLHDL, LDLDIRECT in the last 72 hours. Thyroid Function Tests: No results for input(s): TSH, T4TOTAL, FREET4, T3FREE, THYROIDAB in the last 72 hours. Anemia Panel: No results for input(s): VITAMINB12, FOLATE, FERRITIN, TIBC, IRON, RETICCTPCT in the last 72 hours. Urine analysis:    Component Value Date/Time   COLORURINE YELLOW 09/11/2018 1750   APPEARANCEUR CLEAR 09/11/2018 1750   LABSPEC 1.025 09/11/2018 1750   PHURINE 5.5 09/11/2018 1750   GLUCOSEU >=500 (A) 09/11/2018 1750   HGBUR NEGATIVE 09/11/2018 1750   BILIRUBINUR NEGATIVE 09/11/2018 1750   KETONESUR NEGATIVE 09/11/2018 1750   PROTEINUR NEGATIVE 09/11/2018 1750   UROBILINOGEN  0.2 11/17/2014 1315   NITRITE NEGATIVE 09/11/2018 1750   LEUKOCYTESUR NEGATIVE 09/11/2018 1750    Radiological Exams on Admission: US Abdomen Limited Ruq  Result Date: 09/11/2018 CLINICAL DATA:  Right upper quadrant pain with  nausea and vomiting EXAM: ULTRASOUND ABDOMEN LIMITED RIGHT UPPER QUADRANT COMPARISON:  None. FINDINGS: Gallbladder: No gallstones or wall thickening visualized. There is no pericholecystic fluid. No sonographic Murphy sign noted by sonographer. Common bile duct: Diameter: 3 mm. No intrahepatic or extrahepatic biliary duct dilatation. Liver: There is a cyst in the anterior segment right lobe of the liver measuring 0.7 x 0.8 x 0.8 cm. The echogenicity of the liver overall is increased. Within normal limits in parenchymal echogenicity. Portal vein is patent on color Doppler imaging with normal direction of blood flow towards the liver. There is trace ascites. IMPRESSION: 1. Small liver cyst. No other focal liver lesions identified. Note that the liver demonstrates increased echogenicity, felt to represent underlying hepatic steatosis. The sensitivity of ultrasound for detection of noncystic liver lesions is somewhat diminished in this circumstance. 2.  Trace ascites adjacent to the liver. 3. No demonstrable gallbladder pathology. No evident biliary duct dilatation Electronically Signed   By: Lowella Grip III M.D.   On: 09/11/2018 21:00    EKG: Independently reviewed.  Assessment/Plan Principal Problem:   Acute hepatitis Active Problems:   Essential hypertension   Alcohol abuse   Lactic acidosis   IDDM (insulin dependent diabetes mellitus) ([place])    1. EtOH hepatitis - 1. Check INR and repeat CBC/CMP in AM 2. Acute hepatitis pnl to r/o viral hepatitis 3. IVF: NS at 100 cc/hr 4. Calculate Maddrey's after INR back, but with bilirubin of only 1.9, doubt steroids will be indicated at this time. 2. IDDM - 1. Got full 25u lantus last night in ED. 2. Have ordered 15u QHS  while here 3. Sensitive SSI Q4H 4. Keep close eye on patient for lows after the 25u given that he isnt really eating right now 5. BGL already down to 143. 6. Repeat BGL in 1h (spoke to RN) 7. If keeps trending down may need to start D5 half 3. HTN - 1. Continue clonidine 2. Holding amlodipine and lisinopril for the moment  DVT prophylaxis: Lovenox (assuming INR not elevated) Code Status: Full Family Communication: No family in room Disposition Plan: Home after admit Consults called: None Admission status: Admit to inpatient  Severity of Illness: The appropriate patient status for this patient is INPATIENT. Inpatient status is judged to be reasonable and necessary in order to provide the required intensity of service to ensure the patient's safety. The patient's presenting symptoms, physical exam findings, and initial radiographic and laboratory data in the context of their chronic comorbidities is felt to place them at high risk for further clinical deterioration. Furthermore, it is not anticipated that the patient will be medically stable for discharge from the hospital within 2 midnights of admission. The following factors support the patient status of inpatient.   " The patient's presenting symptoms include N/V. " The worrisome physical exam findings include N/V, tremor, EtOH withdrawal. " The initial radiographic and laboratory data are worrisome because of Lactate of 10.3, AST 842, ALT 356. " The chronic co-morbidities include EtOH abuse, IDDM.   * I certify that at the point of admission it is my clinical judgment that the patient will require inpatient hospital care spanning beyond 2 midnights from the point of admission due to high intensity of service, high risk for further deterioration and high frequency of surveillance required.*    ,  M. DO [facility]  How to contact the [facility] Attending or Consulting provider [employer] or covering provider during after  hours [place], for this  patient?  1. Check the care team in [facility] and look for a) attending/consulting [facility] provider listed and b) the [facility] team listed 2. Log into [email or web address]  Amion Physician Scheduling and messaging for groups and whole hospitals  On call and physician scheduling software for group practices, residents, hospitalists and other medical providers for call, clinic, rotation and shift schedules. On[employer] is a hospital-wide system for scheduling doctors and paging doctors on call. EasyPlot is for scientific plotting and data analysis.  [email or web address]  and use [place]'s universal password to access. If you do not have the password, please contact the hospital operator.  3. Locate the [facility] provider you are looking for under [facility] and page to a number that you can be directly reached. 4. If you still have difficulty reaching the provider, please page the [facility] (Director on Call) for the Hospitalists listed on amion for assistance.  09/12/2018, 3:26 AM

## 2018-09-13 LAB — COMPREHENSIVE METABOLIC PANEL
ALT: 196 U/L — ABNORMAL HIGH (ref 0–44)
AST: 286 U/L — ABNORMAL HIGH (ref 15–41)
Albumin: 2.9 g/dL — ABNORMAL LOW (ref 3.5–5.0)
Alkaline Phosphatase: 113 U/L (ref 38–126)
Anion gap: 11 (ref 5–15)
BUN: <5 mg/dL — ABNORMAL LOW (ref 6–20)
CO2: 24 mmol/L (ref 22–32)
Calcium: 8.2 mg/dL — ABNORMAL LOW (ref 8.9–10.3)
Chloride: 96 mmol/L — ABNORMAL LOW (ref 98–111)
Creatinine, Ser: 0.73 mg/dL (ref 0.61–1.24)
GFR calc Af Amer: >60 mL/min (ref >60)
GFR calc non Af Amer: >60 mL/min (ref >60)
Glucose, Bld: 88 mg/dL (ref 70–99)
Potassium: 2.9 mmol/L — ABNORMAL LOW (ref 3.5–5.1)
Sodium: 131 mmol/L — ABNORMAL LOW (ref 135–145)
Total Bilirubin: 1.6 mg/dL — ABNORMAL HIGH (ref 0.3–1.2)
Total Protein: 6.2 g/dL — ABNORMAL LOW (ref 6.5–8.1)

## 2018-09-13 LAB — GLUCOSE, CAPILLARY
Glucose-Capillary: 115 mg/dL — ABNORMAL HIGH (ref 70–99)
Glucose-Capillary: 215 mg/dL — ABNORMAL HIGH (ref 70–99)
Glucose-Capillary: 139 mg/dL — ABNORMAL HIGH (ref 70–99)
Glucose-Capillary: 256 mg/dL — ABNORMAL HIGH (ref 70–99)
Glucose-Capillary: 262 mg/dL — ABNORMAL HIGH (ref 70–99)

## 2018-09-13 MED ORDER — POTASSIUM CHLORIDE 10 MEQ/100ML IV SOLN
10.0000 meq | INTRAVENOUS | Status: AC
  Administered 2018-09-13 (×4): 10 meq via INTRAVENOUS
  Filled 2018-09-13 (×4): qty 100

## 2018-09-13 MED ORDER — DEXMEDETOMIDINE HCL IN NACL 200 MCG/50ML IV SOLN
0.4000 ug/kg/h | INTRAVENOUS | Status: DC
  Administered 2018-09-13 (×2): 0.4 ug/kg/h via INTRAVENOUS
  Administered 2018-09-14: 08:00:00 0.8 ug/kg/h via INTRAVENOUS
  Administered 2018-09-14: 05:00:00 0.9 ug/kg/h via INTRAVENOUS
  Administered 2018-09-14: 12:00:00 0.5 ug/kg/h via INTRAVENOUS
  Administered 2018-09-14 (×2): 0.4 ug/kg/h via INTRAVENOUS
  Filled 2018-09-13 (×4): qty 50

## 2018-09-13 MED ORDER — LORAZEPAM 2 MG/ML IJ SOLN
2.0000 mg | INTRAMUSCULAR | Status: DC | PRN
  Administered 2018-09-13 (×2): 2 mg via INTRAVENOUS
  Administered 2018-09-13: 05:00:00 3 mg via INTRAVENOUS
  Administered 2018-09-13 – 2018-09-15 (×7): 2 mg via INTRAVENOUS
  Filled 2018-09-13 (×9): qty 1
  Filled 2018-09-13: qty 2

## 2018-09-13 NOTE — Progress Notes (Signed)
Patient keeps attempting to get out of bed . He is not easily redirected. Advised patient to stay in bed patient he is not rationale at this time. Patient keeps saying we are kidnapping him and he needs to go get his kids they are in the bathroom.  Dr. Darrick Meigs on unit and notified.

## 2018-09-13 NOTE — Progress Notes (Signed)
[facility]  PROGRESS NOTE  Willie [name] HQI:[id number] DOB: 1975-07-21 DOA: 09/11/2018 PCP: Debbrah Alar, NP   Brief HPI:   43 year old male with history of diabetes mellitus type 2, alcohol abuse, hypertension, recurrent alcoholic pancreatitis came to ED with complaints of nausea vomiting abdominal pain.  Patient admitted with alcoholic hepatitis.  Started on CIWA protocol for alcohol withdrawal.    Subjective   This morning patient is confused, mild alcohol withdrawal.  Patient is currently on Ativan 2 mg IV every 4 hours as needed per CIWA protocol.   Assessment/Plan:     1. Alcoholic hepatitis-patient presents with alcoholic hepatitis, Maddrey's score around 10. Will not benefit from glucocorticoids.  2. Alcohol withdrawal-patient started on CIWA protocol has been getting Ativan 2 mg IV every 4 hours as needed.  As per nursing staff patient is still agitated despite getting Ativan.  Will start Precedex infusion for next 24 to 48 hours.  If no improvement will consult PCCM for further recommendations.Continue thiamine, folate.  3. Diabetes mellitus type 2-patient is on clear liquid diet, wants to try solid food.  Will start on soft diet.  4. Intractable nausea and vomiting-improved, Continue Zofran PRN  5. Hypoglycemia-resolved, patient is on diet.  6. Hypertension-blood pressure is stable, continue clonidine 0.1 mg 3 times daily.  Amlodipine and lisinopril is on hold.  7. Hypokalemia-potassium is 2.9, being replaced with IV KCl 10 mEq x 4.  Follow BMP in a.m.       CBG: Recent Labs  Lab 09/12/18 1511 09/12/18 1940 09/12/18 2306 09/13/18 0306 09/13/18 0730  GLUCAP 226* 219* 97 115* 215*    CBC: Recent Labs  Lab 09/11/18 1750 09/11/18 1754 09/12/18 0528  WBC 8.3  --  5.9  HGB 13.8 15.6 11.0*  HCT 39.2 46.0 31.0*  MCV 85.2  --  87.3  PLT 291  --  132    Basic Metabolic Panel: Recent Labs  Lab 09/11/18 1750 09/11/18 1754  09/11/18 2026 09/12/18 0528 09/13/18 0249  NA 131* 131* 132* 134* 131*  K 4.5 4.3 3.9 3.1* 2.9*  CL 87*  --  93* 98 96*  CO2 23  --  25 26 24   GLUCOSE 349*  --  263* 84 88  BUN 9  --  8 6 <5*  CREATININE 1.20  --  0.91 0.74 0.73  CALCIUM 9.7  --  8.4* 8.3* 8.2*     DVT prophylaxis: Lovenox  Code Status: Full code  Family Communication: No family at bedside  Disposition Plan: likely home when medically ready for discharge     Consultants:    Procedures:     Antibiotics:   Anti-infectives (From admission, onward)   Start     Dose/Rate Route Frequency Ordered Stop   09/11/18 1845  piperacillin-tazobactam (ZOSYN) IVPB 3.375 g     3.375 g 100 mL/hr over 30 Minutes Intravenous  Once 09/11/18 1842 09/11/18 2001       Objective   Vitals:   09/13/18 0800 09/13/18 1000 09/13/18 1041 09/13/18 1052  BP: 106/84  (!) 133/104   Pulse: (!) 112 97 94 97  Resp: 18     Temp:      TempSrc:      SpO2: 99%  (!) 88% 93%  Weight:      Height:        Intake/Output Summary (Last 24 hours) at 09/13/2018 1113 Last data filed at 09/13/2018 0400 Gross per 24 hour  Intake 900 ml  Output 2200 ml  Net -1300 ml   Filed Weights   09/11/18 1719 09/12/18 0204  Weight: 77.1 kg 80.2 kg     Physical Examination:   General-appears in no acute distress Heart-S1-S2, regular, no murmur auscultated Lungs-clear to auscultation bilaterally, no wheezing or crackles auscultated Abdomen-soft, nontender, no organomegaly Extremities-no edema in the lower extremities Neuro/psych-alert, oriented x2, no focal deficit noted, mildly confused, agitated    Data Reviewed: I have personally reviewed following labs and imaging studies   Recent Results (from the past 240 hour(s))  Culture, blood (routine x 2)     Status: None (Preliminary result)   Collection Time: 09/11/18  7:10 PM  Result Value Ref Range Status   Specimen Description   Final    BLOOD RIGHT ANTECUBITAL Performed at [facility], [address] [place], [place] [address]    Special Requests   Final    BOTTLES DRAWN AEROBIC AND ANAEROBIC Blood Culture adequate volume Performed at [facility], [address] [place], [place] [address]    Culture   Final    NO GROWTH < 12 HOURS Performed at  Hospital Lab, [place] [address], [place], [place] [address]    Report Status PENDING  Incomplete  Culture, blood (routine x 2)     Status: None (Preliminary result)   Collection Time: 09/11/18  7:20 PM  Result Value Ref Range Status   Specimen Description   Final    BLOOD RIGHT HAND Performed at [facility], [place] [address], [place], [place] [address]    Special Requests   Final    BOTTLES DRAWN AEROBIC ONLY Blood Culture results may not be optimal due to an inadequate volume of blood received in culture bottles Performed at [facility], [address] [place], [place] [address]    Culture   Final    NO GROWTH < 12 HOURS Performed at [facility], [place] [address] [place], [place] [address]    Report Status PENDING  Incomplete  SARS Coronavirus 2 (Hosp order,Performed in [facility] lab via [employer] ID)     Status: None   Collection Time: 09/11/18  9:54 PM  Result Value Ref Range Status   SARS Coronavirus 2 (Abbott ID Now) NEGATIVE NEGATIVE Final    Comment: (NOTE) Interpretive Result Comment(s): COVID 19 Positive SARS CoV 2 target nucleic acids are DETECTED. The SARS CoV 2 RNA is generally detectable in upper and lower respiratory specimens during the acute phase of infection.  Positive results are indicative of active infection with SARS CoV 2.  Clinical correlation with patient history and other diagnostic information is necessary to determine patient infection status.  Positive results do not rule out bacterial infection or coinfection with other viruses. The expected result is Negative. COVID 19 Negative SARS CoV 2 target nucleic acids are NOT  DETECTED. The SARS CoV 2 RNA is generally detectable in upper and lower respiratory specimens during the acute phase of infection.  Negative results do not preclude SARS CoV 2 infection, do not rule out coinfections with other pathogens, and should not be used as the sole basis for treatment or other patient management decisions.  Negative results must be combined with clinical  observations, patient history, and epidemiological information. The expected result is Negative. Invalid Presence or absence of SARS CoV 2 nucleic acids cannot be determined. Repeat testing was performed on the submitted specimen and repeated Invalid results were obtained.  If clinically  indicated, additional testing on a new specimen with an alternate test methodology [phone]) is advised.  The SARS CoV 2 RNA is generally detectable in upper and lower respiratory specimens during the acute phase of infection. The expected result is Negative. Fact Sheet for Patients:  GolfingFamily.no Fact Sheet for Healthcare Providers: https://www.hernandez-brewer.com/ This test is not yet approved or cleared by the [place] FDA and has been authorized for detection and/or diagnosis of SARS CoV 2 by FDA under an Emergency Use Authorization (EUA).  This EUA will remain in effect (meaning this test can be used) for the duration of the COVID19 d eclaration under Section 564(b)(1) of the Act, 21 U.S.C. section [phone] 3(b)(1), unless the authorization is terminated or revoked sooner. Performed at [facility], [address] [place], [place] [address]   MRSA PCR Screening     Status: None   Collection Time: 09/12/18  8:30 AM  Result Value Ref Range Status   MRSA by PCR NEGATIVE NEGATIVE Final    Comment:        The GeneXpert MRSA Assay (FDA approved for NASAL specimens only), is one component of a comprehensive MRSA colonization surveillance program. It is not intended  to diagnose MRSA infection nor to guide or monitor treatment for MRSA infections. Performed at [facility], [place] [address] [place], [place] [address]      Liver Function Tests: Recent Labs  Lab 09/11/18 1750 09/12/18 0528 09/13/18 0249  AST 842* 504* 286*  ALT 356* 240* 196*  ALKPHOS 167* 117 113  BILITOT 1.9* 1.7* 1.6*  PROT 8.0 6.0* 6.2*  ALBUMIN 3.8 2.8* 2.9*   Recent Labs  Lab 09/11/18 1750  LIPASE 19   No results for input(s): AMMONIA in the last 168 hours.  Cardiac Enzymes: No results for input(s): CKTOTAL, CKMB, CKMBINDEX, TROPONINI in the last 168 hours. BNP (last 3 results) No results for input(s): BNP in the last 8760 hours.  ProBNP (last 3 results) No results for input(s): PROBNP in the last 8760 hours.    Studies: US Abdomen Limited Ruq  Result Date: 09/11/2018 CLINICAL DATA:  Right upper quadrant pain with nausea and vomiting EXAM: ULTRASOUND ABDOMEN LIMITED RIGHT UPPER QUADRANT COMPARISON:  None. FINDINGS: Gallbladder: No gallstones or wall thickening visualized. There is no pericholecystic fluid. No sonographic Murphy sign noted by sonographer. Common bile duct: Diameter: 3 mm. No intrahepatic or extrahepatic biliary duct dilatation. Liver: There is a cyst in the anterior segment right lobe of the liver measuring 0.7 x 0.8 x 0.8 cm. The echogenicity of the liver overall is increased. Within normal limits in parenchymal echogenicity. Portal vein is patent on color Doppler imaging with normal direction of blood flow towards the liver. There is trace ascites. IMPRESSION: 1. Small liver cyst. No other focal liver lesions identified. Note that the liver demonstrates increased echogenicity, felt to represent underlying hepatic steatosis. The sensitivity of ultrasound for detection of noncystic liver lesions is somewhat diminished in this circumstance. 2.  Trace ascites adjacent to the liver. 3. No demonstrable gallbladder pathology. No evident  biliary duct dilatation Electronically Signed   By: Lowella Grip III M.D.   On: 09/11/2018 21:00    Scheduled Meds: . Chlorhexidine Gluconate Cloth  6 each Topical Daily  . cloNIDine  0.1 mg Oral TID  . enoxaparin (LOVENOX) injection  40 mg Subcutaneous Q24H  . escitalopram  20 mg Oral Daily  . folic acid  1 mg Oral Daily  . insulin aspart  0-9 Units Subcutaneous Q4H  . multivitamin with minerals  1 tablet Oral Daily  . pantoprazole (PROTONIX) IV  40 mg Intravenous Q12H  . QUEtiapine  300 mg Oral QHS  . sodium chloride flush  3 mL Intravenous Once  . thiamine  100 mg Oral Daily   Or  . thiamine  100 mg Intravenous Daily    Admission status: Inpatient: Based on patients clinical presentation and evaluation of above clinical data, I have made determination that patient meets Inpatient criteria at this time.  Time spent: 20 min  [facility] Pager [phone]. If 7PM-7AM, please contact night-coverage at [email or web address], Office  [phone]  password [email or web address]  09/13/2018, 11:13 AM  LOS: 2 days

## 2018-09-14 LAB — COMPREHENSIVE METABOLIC PANEL
ALT: 134 U/L — ABNORMAL HIGH (ref 0–44)
AST: 150 U/L — ABNORMAL HIGH (ref 15–41)
Albumin: 2.4 g/dL — ABNORMAL LOW (ref 3.5–5.0)
Alkaline Phosphatase: 100 U/L (ref 38–126)
Anion gap: 7 (ref 5–15)
BUN: <5 mg/dL — ABNORMAL LOW (ref 6–20)
CO2: 25 mmol/L (ref 22–32)
Calcium: 8.1 mg/dL — ABNORMAL LOW (ref 8.9–10.3)
Chloride: 104 mmol/L (ref 98–111)
Creatinine, Ser: 0.86 mg/dL (ref 0.61–1.24)
GFR calc Af Amer: >60 mL/min (ref >60)
GFR calc non Af Amer: >60 mL/min (ref >60)
Glucose, Bld: 81 mg/dL (ref 70–99)
Potassium: 3.8 mmol/L (ref 3.5–5.1)
Sodium: 136 mmol/L (ref 135–145)
Total Bilirubin: 1.1 mg/dL (ref 0.3–1.2)
Total Protein: 5.2 g/dL — ABNORMAL LOW (ref 6.5–8.1)

## 2018-09-14 LAB — GLUCOSE, CAPILLARY
Glucose-Capillary: 102 mg/dL — ABNORMAL HIGH (ref 70–99)
Glucose-Capillary: 136 mg/dL — ABNORMAL HIGH (ref 70–99)
Glucose-Capillary: 158 mg/dL — ABNORMAL HIGH (ref 70–99)
Glucose-Capillary: 200 mg/dL — ABNORMAL HIGH (ref 70–99)
Glucose-Capillary: 233 mg/dL — ABNORMAL HIGH (ref 70–99)
Glucose-Capillary: 268 mg/dL — ABNORMAL HIGH (ref 70–99)
Glucose-Capillary: 275 mg/dL — ABNORMAL HIGH (ref 70–99)

## 2018-09-14 LAB — HEPATITIS PANEL, ACUTE
HCV Ab: 0.2 s/co ratio (ref 0.0–0.9)
Hep A IgM: NEGATIVE
Hep B C IgM: NEGATIVE
Hepatitis B Surface Ag: NEGATIVE

## 2018-09-14 LAB — CBC
HCT: 27.8 % — ABNORMAL LOW (ref 39.0–52.0)
Hemoglobin: 9.4 g/dL — ABNORMAL LOW (ref 13.0–17.0)
MCH: 30.6 pg (ref 26.0–34.0)
MCHC: 33.8 g/dL (ref 30.0–36.0)
MCV: 90.6 fL (ref 80.0–100.0)
Platelets: 155 10*3/uL (ref 150–400)
RBC: 3.07 MIL/uL — ABNORMAL LOW (ref 4.22–5.81)
RDW: 13.9 % (ref 11.5–15.5)
WBC: 4.5 10*3/uL (ref 4.0–10.5)
nRBC: 0 % (ref 0.0–0.2)

## 2018-09-14 MED ORDER — LORAZEPAM 2 MG/ML IJ SOLN
0.5000 mg | Freq: Once | INTRAMUSCULAR | Status: AC
  Administered 2018-09-14: 22:00:00 0.5 mg via INTRAVENOUS
  Filled 2018-09-14: qty 1

## 2018-09-14 MED ORDER — ZOLPIDEM TARTRATE 5 MG PO TABS
5.0000 mg | ORAL_TABLET | Freq: Every evening | ORAL | Status: DC | PRN
  Administered 2018-09-15: 5 mg via ORAL
  Filled 2018-09-14: qty 1

## 2018-09-14 MED ORDER — INSULIN GLARGINE 100 UNIT/ML ~~LOC~~ SOLN
8.0000 [IU] | Freq: Every day | SUBCUTANEOUS | Status: DC
  Administered 2018-09-14: 22:00:00 8 [IU] via SUBCUTANEOUS
  Filled 2018-09-14 (×3): qty 0.08

## 2018-09-14 MED ORDER — PANTOPRAZOLE SODIUM 40 MG PO TBEC
40.0000 mg | DELAYED_RELEASE_TABLET | Freq: Two times a day (BID) | ORAL | Status: DC
  Administered 2018-09-14 – 2018-09-15 (×2): 40 mg via ORAL
  Filled 2018-09-14 (×2): qty 1

## 2018-09-14 NOTE — Progress Notes (Signed)
Inpatient Diabetes Program Recommendations  AACE/ADA: New Consensus Statement on Inpatient Glycemic Control (2015)  Target Ranges:  Prepandial:   less than 140 mg/dL      Peak postprandial:   less than 180 mg/dL (1-2 hours)      Critically ill patients:  140 - 180 mg/dL   Results for Willie [name], Willie [name] (MRN [id number]) as of 09/14/2018 11:19  Ref. Range 09/12/2018 23:06 09/13/2018 03:06 09/13/2018 07:30 09/13/2018 12:12 09/13/2018 16:32 09/13/2018 20:04  Glucose-Capillary Latest Ref Range: 70 - 99 mg/dL 97 115 (H) 215 (H)  3 units NOVOLOG 139 (H)  1 unit NOVOLOG 256 (H)  5 units NOVOLOG 262 (H)  5 units NOVOLOG    Results for Willie [name], Willie [name] (MRN [id number]) as of 09/14/2018 11:19  Ref. Range 09/13/2018 23:06 09/14/2018 04:38 09/14/2018 08:04  Glucose-Capillary Latest Ref Range: 70 - 99 mg/dL 275 (H)  5 units NOVOLOG 158 (H)  2 units NOVOLOG 136 (H)  1 unit [place] with: ETOH Hepatitis  History: DM, ETOH Abuse, Recurrent Pancreatitis  Home DM Meds: Lantus 25 units QHS       Humalog 5 units with Breakfast/ 15 units with Lunch/ 10 units with Dinner  Current Orders: Novolog Sensitive Correction Scale/ SSI (0-9 units) Q4 hours     Per MD notes: Pt reports he normally drinks ~5-10 shots of liquor daily and reports h/o alcohol withdrawal but no withdrawal seizures.  Hasn't taken insulin in 2 days and doesn't check his CBGs.  Given 25 units Lantus on 05/15 at 11:04pm--NO Lantus given since then.    MD- Please consider the following in-hospital insulin adjustments:  Start Lantus 8 units QHS (1/3 total home dose)      --Will follow patient during hospitalization--  Wyn Quaker RN, MSN, CDE Diabetes Coordinator Inpatient Glycemic Control Team Team Pager: [phone] (8a-5p)

## 2018-09-14 NOTE — Progress Notes (Signed)
[facility]  PROGRESS NOTE  Willie [name] YCX:[id number] DOB: 1976-03-25 DOA: 09/11/2018 PCP: Debbrah Alar, NP   Brief HPI:   43 year old male with history of diabetes mellitus type 2, alcohol abuse, hypertension, recurrent alcoholic pancreatitis came to ED with complaints of nausea vomiting abdominal pain.  Patient admitted with alcoholic hepatitis.  Started on CIWA protocol for alcohol withdrawal.    Subjective   Patient seen and examined started on Precedex drip yesterday.  He is somnolent this morning.  Blood glucose has been steadily rising.  Liver enzymes improving.   Assessment/Plan:     1. Alcoholic hepatitis-patient presents with alcoholic hepatitis, Maddrey's score around 10. Will not benefit from glucocorticoids.  Liver enzymes are improving.  See labs below.  2. Alcohol withdrawal-patient started on CIWA protocol has been getting Ativan 2 mg IV agitated every 4 hours as needed.  As per nursing staff patient wasagitated despite getting Ativan.  Patient was started on Precedex infusion .Continue thiamine, folate.  3. Diabetes mellitus type 2-patient is on clear liquid diet, wants to try solid food.  Will start on soft diet.  Blood glucose is high.  Will add Lantus 8 units subcu daily.  4. Intractable nausea and vomiting-improved, Continue Zofran PRN  5. Hypoglycemia-resolved, patient is on soft diet.  6. Hypertension-blood pressure is stable, continue clonidine 0.1 mg 3 times daily.  Amlodipine and lisinopril is on hold.  7. Hypokalemia-replete    CBG: Recent Labs  Lab 09/13/18 2306 09/14/18 0438 09/14/18 0804 09/14/18 1213 09/14/18 1627  GLUCAP 275* 158* 136* 102* 268*    CBC: Recent Labs  Lab 09/11/18 1750 09/11/18 1754 09/12/18 0528 09/14/18 0246  WBC 8.3  --  5.9 4.5  HGB 13.8 15.6 11.0* 9.4*  HCT 39.2 46.0 31.0* 27.8*  MCV 85.2  --  87.3 90.6  PLT 291  --  196 497    Basic Metabolic Panel: Recent Labs  Lab 09/11/18 1750  09/11/18 1754 09/11/18 2026 09/12/18 0528 09/13/18 0249 09/14/18 0246  NA 131* 131* 132* 134* 131* 136  K 4.5 4.3 3.9 3.1* 2.9* 3.8  CL 87*  --  93* 98 96* 104  CO2 23  --  25 26 24 25   GLUCOSE 349*  --  263* 84 88 81  BUN 9  --  8 6 <5* <5*  CREATININE 1.20  --  0.91 0.74 0.73 0.86  CALCIUM 9.7  --  8.4* 8.3* 8.2* 8.1*     DVT prophylaxis: Lovenox  Code Status: Full code  Family Communication: No family at bedside  Disposition Plan: likely home when medically ready for discharge     Consultants:    Procedures:     Antibiotics:   Anti-infectives (From admission, onward)   Start     Dose/Rate Route Frequency Ordered Stop   09/11/18 1845  piperacillin-tazobactam (ZOSYN) IVPB 3.375 g     3.375 g 100 mL/hr over 30 Minutes Intravenous  Once 09/11/18 1842 09/11/18 2001       Objective   Vitals:   09/14/18 1000 09/14/18 1100 09/14/18 1200 09/14/18 1300  BP: (!) 137/97 (!) 137/107 (!) 142/104 117/79  Pulse: 73 71 71 90  Resp: 17 18 18 20   Temp:      TempSrc:      SpO2: 95% 93% 90% 100%  Weight:      Height:        Intake/Output Summary (Last 24 hours) at 09/14/2018 1631 Last data filed at 09/14/2018 1217 Gross per 24 hour  Intake 3367.63 ml  Output 2700 ml  Net 667.63 ml   Filed Weights   09/11/18 1719 09/12/18 0204 09/14/18 0600  Weight: 77.1 kg 80.2 kg 83 kg     Physical Examination:  General-appears in no acute distress, somnolent Heart-S1-S2, regular, no murmur auscultated Lungs-clear to auscultation bilaterally, no wheezing or crackles auscultated Abdomen-soft, nontender, no organomegaly Extremities-no edema in the lower extremities Neuro-somnolent but arousable, moving all extremities.   Data Reviewed: I have personally reviewed following labs and imaging studies   Recent Results (from the past 240 hour(s))  Culture, blood (routine x 2)     Status: None (Preliminary result)   Collection Time: 09/11/18  7:10 PM  Result Value Ref  Range Status   Specimen Description   Final    BLOOD RIGHT ANTECUBITAL Performed at [facility], [address] [place], [place] [address]    Special Requests   Final    BOTTLES DRAWN AEROBIC AND ANAEROBIC Blood Culture adequate volume Performed at [facility], [address] [place], [place] [address]    Culture   Final    NO GROWTH 3 DAYS Performed at [facility], [place] [address] [place], [place] [address]    Report Status PENDING  Incomplete  Culture, blood (routine x 2)     Status: None (Preliminary result)   Collection Time: 09/11/18  7:20 PM  Result Value Ref Range Status   Specimen Description   Final    BLOOD RIGHT HAND Performed at [facility], [place] [address], [place], [place] [address]    Special Requests   Final    BOTTLES DRAWN AEROBIC ONLY Blood Culture results may not be optimal due to an inadequate volume of blood received in culture bottles Performed at [facility], [address] [place], [place] [address]    Culture   Final    NO GROWTH 3 DAYS Performed at [facility], [place] [address], [place], [place] [address]    Report Status PENDING  Incomplete  SARS Coronavirus 2 (Hosp order,Performed in [facility] lab via [employer] ID)     Status: None   Collection Time: 09/11/18  9:54 PM  Result Value Ref Range Status   SARS Coronavirus 2 (Abbott ID Now) NEGATIVE NEGATIVE Final    Comment: (NOTE) Interpretive Result Comment(s): COVID 19 Positive SARS CoV 2 target nucleic acids are DETECTED. The SARS CoV 2 RNA is generally detectable in upper and lower respiratory specimens during the acute phase of infection.  Positive results are indicative of active infection with SARS CoV 2.  Clinical correlation with patient history and other diagnostic information is necessary to determine patient infection status.  Positive results do not rule out bacterial infection or coinfection with other viruses. The expected  result is Negative. COVID 19 Negative SARS CoV 2 target nucleic acids are NOT DETECTED. The SARS CoV 2 RNA is generally detectable in upper and lower respiratory specimens during the acute phase of infection.  Negative results do not preclude SARS CoV 2 infection, do not rule out coinfections with other pathogens, and should not be used as the sole basis for treatment or other patient management decisions.  Negative results must be combined with clinical  observations, patient history, and epidemiological information. The expected result is Negative. Invalid Presence or absence of SARS CoV 2 nucleic acids cannot be determined. Repeat testing was performed on the submitted specimen and repeated Invalid results  were obtained.  If clinically indicated, additional testing on a new specimen with an alternate test methodology [phone]) is advised.  The SARS CoV 2 RNA is generally detectable in upper and lower respiratory specimens during the acute phase of infection. The expected result is Negative. Fact Sheet for Patients:  GolfingFamily.no Fact Sheet for Healthcare Providers: https://www.hernandez-brewer.com/ This test is not yet approved or cleared by the [place] FDA and has been authorized for detection and/or diagnosis of SARS CoV 2 by FDA under an Emergency Use Authorization (EUA).  This EUA will remain in effect (meaning this test can be used) for the duration of the COVID19 d eclaration under Section 564(b)(1) of the Act, 21 U.S.C. section [phone] 3(b)(1), unless the authorization is terminated or revoked sooner. Performed at [facility], [address] [place], [place] [address]   MRSA PCR Screening     Status: None   Collection Time: 09/12/18  8:30 AM  Result Value Ref Range Status   MRSA by PCR NEGATIVE NEGATIVE Final    Comment:        The GeneXpert MRSA Assay (FDA approved for NASAL specimens only), is one component  of a comprehensive MRSA colonization surveillance program. It is not intended to diagnose MRSA infection nor to guide or monitor treatment for MRSA infections. Performed at [facility], [place] [address], [place], [place] [address]      Liver Function Tests: Recent Labs  Lab 09/11/18 1750 09/12/18 0528 09/13/18 0249 09/14/18 0246  AST 842* 504* 286* 150*  ALT 356* 240* 196* 134*  ALKPHOS 167* 117 113 100  BILITOT 1.9* 1.7* 1.6* 1.1  PROT 8.0 6.0* 6.2* 5.2*  ALBUMIN 3.8 2.8* 2.9* 2.4*   Recent Labs  Lab 09/11/18 1750  LIPASE 19   No results for input(s): AMMONIA in the last 168 hours.  Cardiac Enzymes: No results for input(s): CKTOTAL, CKMB, CKMBINDEX, TROPONINI in the last 168 hours. BNP (last 3 results) No results for input(s): BNP in the last 8760 hours.  ProBNP (last 3 results) No results for input(s): PROBNP in the last 8760 hours.    Studies: No results found.  Scheduled Meds: . Chlorhexidine Gluconate Cloth  6 each Topical Daily  . cloNIDine  0.1 mg Oral TID  . enoxaparin (LOVENOX) injection  40 mg Subcutaneous Q24H  . escitalopram  20 mg Oral Daily  . folic acid  1 mg Oral Daily  . insulin aspart  0-9 Units Subcutaneous Q4H  . insulin glargine  8 Units Subcutaneous QHS  . multivitamin with minerals  1 tablet Oral Daily  . pantoprazole  40 mg Oral BID  . QUEtiapine  300 mg Oral QHS  . thiamine  100 mg Oral Daily   Or  . thiamine  100 mg Intravenous Daily    Admission status: Inpatient: Based on patients clinical presentation and evaluation of above clinical data, I have made determination that patient meets Inpatient criteria at this time.  Time spent: 20 min  [facility] Pager [phone]. If 7PM-7AM, please contact night-coverage at [email or web address], Office  [phone]  password [email or web address]  09/14/2018, 4:31 PM  LOS: 3 days

## 2018-09-14 NOTE — TOC Initial Note (Signed)
Transition of Care [facility]) - Initial/Assessment Note    Patient Details  Name: Willie [name] MRN: [id number] Date of Birth: 11/11/1975  Transition of Care (TOC) CM/SW Contact:    Joaquin Courts, RN Phone Number: 09/14/2018, 11:25 AM  Clinical Narrative:                   Expected Discharge Plan: Home/Self Care Barriers to Discharge: Continued Medical Work up   Patient Goals and CMS Choice        Expected Discharge Plan and Services Expected Discharge Plan: Home/Self Care       Living arrangements for the past 2 months: Single Family Home Expected Discharge Date: 09/14/18                                    Prior Living Arrangements/Services Living arrangements for the past 2 months: Single Family Home Lives with:: Spouse Patient language and need for interpreter reviewed:: Yes        Need for Family Participation in Patient Care: Yes (Comment) Care giver support system in place?: Yes (comment)   Criminal Activity/Legal Involvement Pertinent to Current Situation/Hospitalization: No - Comment as needed  Activities of Daily Living Home Assistive Devices/Equipment: None ADL Screening (condition at time of admission) Patient's cognitive ability adequate to safely complete daily activities?: Yes Is the patient deaf or have difficulty hearing?: No Does the patient have difficulty seeing, even when wearing glasses/contacts?: No Does the patient have difficulty concentrating, remembering, or making decisions?: No Patient able to express need for assistance with ADLs?: Yes Does the patient have difficulty dressing or bathing?: No Independently performs ADLs?: Yes (appropriate for developmental age) Does the patient have difficulty walking or climbing stairs?: No Weakness of Legs: Both Weakness of Arms/Hands: None  Permission Sought/Granted                  Emotional Assessment Appearance:: Appears stated age     Orientation: : Fluctuating  Orientation (Suspected and/or reported Sundowners) Alcohol / Substance Use: Alcohol Use Psych Involvement: No (comment)  Admission diagnosis:  Dehydration [E86.0] Acute hepatitis [B17.9] Hyperglycemia [R73.9] Abdominal pain [R10.9] Alcohol withdrawal syndrome without complication ([place]) [J50.093] Patient Active Problem List   Diagnosis Date Noted  . IDDM (insulin dependent diabetes mellitus) ([place]) 09/12/2018  . Acute hepatitis 09/11/2018  . Alcohol-induced mood disorder ([place])   . DKA (diabetic ketoacidoses) ([place]) 07/28/2017  . Alcohol withdrawal ([place]) 07/28/2017  . Abnormal CT scan, colon   . Benign neoplasm of descending colon   . Lower GI bleed 07/04/2017  . Transaminitis 07/04/2017  . Pain   . Bleeding internal hemorrhoids   . Anemia of chronic disease   . Left arm weakness   . Anemia, blood loss   . Vomiting alone 03/27/2017  . Chest pain 03/27/2017  . Nausea and vomiting   . Lactic acidosis 03/26/2017  . History of alcohol abuse 06/25/2016  . Tobacco abuse 06/25/2016  . Anxiety state 11/20/2015  . Dental infection 07/10/2015  . Mood disorder ([place]) 04/18/2015  . Eczema 06/27/2014  . GERD (gastroesophageal reflux disease) 05/24/2014  . Snoring 04/07/2013  . Suicide attempt by substance overdose ([place]) 02/10/2013  . Alcohol abuse 12/31/2012  . Erectile dysfunction 12/09/2012  . Hematuria 06/11/2012  . Vision problem 02/02/2012  . Insomnia 12/20/2011  . Chronic pancreatitis ([place]) 02/05/2011  . Depression with anxiety 11/13/2010  . Hyperlipemia 06/27/2010  . Diabetes  type 2, uncontrolled ([place]) 05/22/2010  . Essential hypertension 05/22/2010   PCP:  Debbrah Alar, NP Pharmacy:   [place], [place] - [address] [facility]. [address] [place] [address] Phone: [phone] Fax: [place], [place]  [address] Phone: [phone] Fax: [place] # [address] [place] [address] Phone: [phone] Fax: [phone]  [place], [place] [address] [place] [address] Phone: [phone] Fax: [place], [place] [address] [place] [address] Phone: [phone] Fax: [phone]     Social Determinants of Health (SDOH) Interventions    Readmission Risk Interventions Readmission Risk Prevention Plan 09/14/2018  Transportation Screening Complete  PCP or Specialist Appt within 3-5 Days Not Complete  Not Complete comments no yet ready for d/c  HRI or [place] Not Complete  HRI or Home Care Consult comments no needs identified at this time  Social Work Consult for [place] Planning/Counseling Not Complete  SW consult not completed comments no needs identified at this time  Palliative Care Screening Not Applicable  Medication Review Press photographer) Complete  Some recent data might be hidden

## 2018-09-14 NOTE — Progress Notes (Signed)

## 2018-09-15 ENCOUNTER — Telehealth: Payer: Self-pay | Admitting: Family

## 2018-09-15 LAB — COMPREHENSIVE METABOLIC PANEL
ALT: 102 U/L — ABNORMAL HIGH (ref 0–44)
AST: 82 U/L — ABNORMAL HIGH (ref 15–41)
Albumin: 2.3 g/dL — ABNORMAL LOW (ref 3.5–5.0)
Alkaline Phosphatase: 107 U/L (ref 38–126)
Anion gap: 7 (ref 5–15)
BUN: 5 mg/dL — ABNORMAL LOW (ref 6–20)
CO2: 26 mmol/L (ref 22–32)
Calcium: 8.1 mg/dL — ABNORMAL LOW (ref 8.9–10.3)
Chloride: 103 mmol/L (ref 98–111)
Creatinine, Ser: 0.72 mg/dL (ref 0.61–1.24)
GFR calc Af Amer: >60 mL/min (ref >60)
GFR calc non Af Amer: >60 mL/min (ref >60)
Glucose, Bld: 235 mg/dL — ABNORMAL HIGH (ref 70–99)
Potassium: 3.8 mmol/L (ref 3.5–5.1)
Sodium: 136 mmol/L (ref 135–145)
Total Bilirubin: 0.9 mg/dL (ref 0.3–1.2)
Total Protein: 5.1 g/dL — ABNORMAL LOW (ref 6.5–8.1)

## 2018-09-15 LAB — GLUCOSE, CAPILLARY
Glucose-Capillary: 245 mg/dL — ABNORMAL HIGH (ref 70–99)
Glucose-Capillary: 84 mg/dL (ref 70–99)
Glucose-Capillary: 385 mg/dL — ABNORMAL HIGH (ref 70–99)

## 2018-09-15 MED ORDER — ALPRAZOLAM 1 MG PO TABS
1.0000 mg | ORAL_TABLET | Freq: Three times a day (TID) | ORAL | Status: DC | PRN
  Administered 2018-09-15: 10:00:00 1 mg via ORAL
  Filled 2018-09-15: qty 1

## 2018-09-15 MED ORDER — ALPRAZOLAM 1 MG PO TABS: 1.0000 mg | ORAL_TABLET | Freq: Three times a day (TID) | ORAL | 0 refills | Status: DC | PRN

## 2018-09-15 MED ORDER — INSULIN ASPART 100 UNIT/ML ~~LOC~~ SOLN: 3.0000 [IU] | Freq: Three times a day (TID) | SUBCUTANEOUS | Status: DC

## 2018-09-15 MED ORDER — INSULIN LISPRO (1 UNIT DIAL) 100 UNIT/ML (KWIKPEN): 5.0000 [IU] | PEN_INJECTOR | Freq: Three times a day (TID) | SUBCUTANEOUS | 2 refills | Status: DC

## 2018-09-15 NOTE — Discharge Summary (Signed)
Physician Discharge Summary  Willie [name] IEP:[id number] DOB: December 06, 1975 DOA: 09/11/2018  PCP: Debbrah Alar, NP  Admit date: 09/11/2018 Discharge date: 09/15/2018  Time spent: 30 minutes  Recommendations for Outpatient Follow-up:  1. Follow-up PCP in 1 to 2 weeks   Discharge Diagnoses:  Principal Problem:   Acute hepatitis Active Problems:   Essential hypertension   Alcohol abuse   Lactic acidosis   IDDM (insulin dependent diabetes mellitus) ([place])   Discharge Condition: Stable  Diet recommendation: Carb modified diet  Filed Weights   09/12/18 0204 09/14/18 0600 09/15/18 0400  Weight: 80.2 kg 83 kg 85.1 kg    History of present illness:  43 year old male with history of diabetes mellitus type 2, alcohol abuse, hypertension, recurrent alcoholic pancreatitis came to ED with complaints of nausea vomiting abdominal pain.  Patient admitted with alcoholic hepatitis.  Started on CIWA protocol for alcohol withdrawal.   Hospital Course:   1. Alcoholic hepatitis-patient presents with alcoholic hepatitis, Maddrey's score around 10. Will not benefit from glucocorticoids.  Liver enzymes have improved.  2. Alcohol withdrawal-patient started on CIWA protocol has been getting Ativan 2 mg IV agitated every 4 hours as needed.  As per nursing staff patient wasagitated despite getting Ativan.  Patient was started on Precedex infusion .Continue thiamine, folate.  Precedex has been discontinued.  Patient is out of alcohol withdrawal.  Did not require Ativan this morning.  Started on Xanax 1 mg 3 times daily as needed for anxiety.  3. Diabetes mellitus type 2-patient is on clear liquid diet, wants to try solid food.    Will continue Lantus and NovoLog sliding scale at home.  4. Intractable nausea and vomiting-resolved.  5. Hypoglycemia-resolved, patient is on soft diet.  6. Hypertension-blood pressure is stable, continue clonidine 0.1 mg 3 times daily.  Amlodipine and lisinopril  is on hold.  7. Hypokalemia-replete   Procedures:    Consultations:    Discharge Exam: Vitals:   09/15/18 1200 09/15/18 1300  BP:  130/89  Pulse:    Resp:  15  Temp: 98.7 F (37.1 C)   SpO2:       Discharge Instructions   Discharge Instructions    Diet - low sodium heart healthy   Complete by:  As directed    Increase activity slowly   Complete by:  As directed      Allergies as of 09/15/2018      Reactions   Invokamet [canagliflozin-metformin Hcl] Other (See Comments)   Lactic acidosis   Metformin And Related Other (See Comments)   DRASTIC drop in blood sugar      Medication List    STOP taking these medications   hydrOXYzine 50 MG tablet Commonly known as:  ATARAX/VISTARIL   LORazepam 0.5 MG tablet Commonly known as:  ATIVAN     TAKE these medications   ALPRAZolam 1 MG tablet Commonly known as:  XANAX Take 1 tablet (1 mg total) by mouth 3 (three) times daily as needed for anxiety.   amLODipine 10 MG tablet Commonly known as:  NORVASC Take 1 tablet (10 mg total) by mouth daily.   blood glucose meter kit and supplies Kit Dispense based on patient and insurance preference. Use up to four times daily as directed. (FOR ICD-9 250.00, 250.01).   Cialis 10 MG tablet Generic drug:  tadalafil TAKE 1 TABLET BY MOUTH DAILY AS NEEDED FOR ERECTILE DYSFUNCTION What changed:  See the new instructions.   cloNIDine 0.1 MG tablet Commonly known as:  CATAPRES Take  1 tablet (0.1 mg total) by mouth 3 (three) times daily.   cyclobenzaprine 5 MG tablet Commonly known as:  FLEXERIL Take 1 tablet (5 mg total) by mouth 2 (two) times daily as needed for muscle spasms.   escitalopram 20 MG tablet Commonly known as:  LEXAPRO Take 1 tablet (20 mg total) by mouth daily.   folic acid 1 MG tablet Commonly known as:  FOLVITE Take 1 tablet (1 mg total) by mouth daily.   freestyle lancets 2 (two) times daily   FreeStyle Libre 14 Day Sensor Misc Inject 1 each  into the skin every 14 (fourteen) days.   glucose blood test strip Commonly known as:  FREESTYLE LITE Use as instructed   Insulin Glargine 100 UNIT/ML Solostar Pen Commonly known as:  Lantus SoloStar Inject 25 Units into the skin at bedtime.   insulin lispro 100 UNIT/ML KwikPen Commonly known as:  HumaLOG KwikPen Inject 0.05-0.15 mLs (5-15 Units total) into the skin 3 (three) times daily. Inject subcutaneously 3 times a day (just before each meal) 09-10-08 units Humalog 5 units with small meal, 10 units with medium sized meal, and 15 units with large meal. What changed:    how much to take  how to take this  when to take this  additional instructions   Insulin Pen Needle 31G X 8 MM Misc Commonly known as:  UltiCare Short Pen Needles Use once daily to inject insulin.   lisinopril 20 MG tablet Commonly known as:  ZESTRIL Take 1 tablet (20 mg total) by mouth daily.   multivitamin tablet Take 1 tablet by mouth daily.   ondansetron 4 MG disintegrating tablet Commonly known as:  Zofran ODT 108m ODT q4 hours prn nausea/vomit What changed:    how much to take  how to take this  when to take this  reasons to take this  additional instructions   pantoprazole 40 MG tablet Commonly known as:  PROTONIX Take 1 tablet (40 mg total) by mouth 2 (two) times daily.   QUEtiapine 300 MG tablet Commonly known as:  SEROQUEL Take 1 tablet (300 mg total) by mouth at bedtime.   sildenafil 20 MG tablet Commonly known as:  REVATIO Take 1-2 tabs by mouth once daily as needed 30-60 minutes prior to sexual activity.   thiamine 100 MG tablet Take 1 tablet (100 mg total) by mouth daily.   zolpidem 5 MG tablet Commonly known as:  AMBIEN Take 1 tablet (5 mg total) by mouth at bedtime as needed. for sleep      Allergies  Allergen Reactions  . Invokamet [Canagliflozin-Metformin Hcl] Other (See Comments)    Lactic acidosis  . Metformin And Related Other (See Comments)    DRASTIC  drop in blood sugar   Follow-up Information    [place] [facility].   Why:  On site pharmacy can fill prescriptions for insulin.  Inquire about meter strips and a patient assistance program to help with insulin costs. If you want to pruchase another meter and strips, [facility] has a low cost option available.  Contact information: [address] [place] [phone]          The results of significant diagnostics from this hospitalization (including imaging, microbiology, ancillary and laboratory) are listed below for reference.    Significant Diagnostic Studies: UKoreaAbdomen Limited Ruq  Result Date: 09/11/2018 CLINICAL DATA:  Right upper quadrant pain with nausea and vomiting EXAM: ULTRASOUND ABDOMEN LIMITED RIGHT  UPPER QUADRANT COMPARISON:  None. FINDINGS: Gallbladder: No gallstones or wall thickening visualized. There is no pericholecystic fluid. No sonographic Murphy sign noted by sonographer. Common bile duct: Diameter: 3 mm. No intrahepatic or extrahepatic biliary duct dilatation. Liver: There is a cyst in the anterior segment right lobe of the liver measuring 0.7 x 0.8 x 0.8 cm. The echogenicity of the liver overall is increased. Within normal limits in parenchymal echogenicity. Portal vein is patent on color Doppler imaging with normal direction of blood flow towards the liver. There is trace ascites. IMPRESSION: 1. Small liver cyst. No other focal liver lesions identified. Note that the liver demonstrates increased echogenicity, felt to represent underlying hepatic steatosis. The sensitivity of ultrasound for detection of noncystic liver lesions is somewhat diminished in this circumstance. 2.  Trace ascites adjacent to the liver. 3. No demonstrable gallbladder pathology. No evident biliary duct dilatation Electronically Signed   By: Lowella Grip III M.D.   On: 09/11/2018 21:00    Microbiology: Recent Results (from the  past 240 hour(s))  Culture, blood (routine x 2)     Status: None (Preliminary result)   Collection Time: 09/11/18  7:10 PM  Result Value Ref Range Status   Specimen Description   Final    BLOOD RIGHT ANTECUBITAL Performed at [facility], [address] [place], [place] [address]    Special Requests   Final    BOTTLES DRAWN AEROBIC AND ANAEROBIC Blood Culture adequate volume Performed at [facility], [address] [place], [place] [address]    Culture   Final    NO GROWTH 4 DAYS Performed at [facility], [place] [address] [place], [place] [address]    Report Status PENDING  Incomplete  Culture, blood (routine x 2)     Status: None (Preliminary result)   Collection Time: 09/11/18  7:20 PM  Result Value Ref Range Status   Specimen Description   Final    BLOOD RIGHT HAND Performed at [facility], [place] [address] [place], [place] [address]    Special Requests   Final    BOTTLES DRAWN AEROBIC ONLY Blood Culture results may not be optimal due to an inadequate volume of blood received in culture bottles Performed at [facility], [place] [place], [place] [address]    Culture   Final    NO GROWTH 4 DAYS Performed at [facility], [place] [address] [place], [place] [address]    Report Status PENDING  Incomplete  SARS Coronavirus 2 (Hosp order,Performed in [facility] lab via [employer] ID)     Status: None   Collection Time: 09/11/18  9:54 PM  Result Value Ref Range Status   SARS Coronavirus 2 ([employer] ID Now) NEGATIVE NEGATIVE Final    Comment: (NOTE) Interpretive Result Comment(s): COVID 19 Positive SARS CoV 2 target nucleic acids are DETECTED. The SARS CoV 2 RNA is generally detectable in upper and lower respiratory specimens during the acute phase of infection.  Positive results are indicative of active infection with SARS CoV 2.  Clinical correlation with patient history and other diagnostic information is necessary to  determine patient infection status.  Positive results do not rule out bacterial infection or coinfection with other viruses. The expected result is Negative. COVID 19 Negative SARS CoV 2 target nucleic acids are NOT DETECTED. The SARS CoV 2 RNA is generally detectable in upper and lower respiratory specimens during the acute  phase of infection.  Negative results do not preclude SARS CoV 2 infection, do not rule out coinfections with other pathogens, and should not be used as the sole basis for treatment or other patient management decisions.  Negative results must be combined with clinical  observations, patient history, and epidemiological information. The expected result is Negative. Invalid Presence or absence of SARS CoV 2 nucleic acids cannot be determined. Repeat testing was performed on the submitted specimen and repeated Invalid results were obtained.  If clinically indicated, additional testing on a new specimen with an alternate test methodology [phone]) is advised.  The SARS CoV 2 RNA is generally detectable in upper and lower respiratory specimens during the acute phase of infection. The expected result is Negative. Fact Sheet for Patients:  GolfingFamily.no Fact Sheet for Healthcare Providers: https://www.hernandez-brewer.com/ This test is not yet approved or cleared by the [place] FDA and has been authorized for detection and/or diagnosis of SARS CoV 2 by FDA under an Emergency Use Authorization (EUA).  This EUA will remain in effect (meaning this test can be used) for the duration of the COVID19 d eclaration under Section 564(b)(1) of the Act, 21 U.S.C. section [phone] 3(b)(1), unless the authorization is terminated or revoked sooner. Performed at [facility], [address] [place], [place] [address]   MRSA PCR Screening     Status: None   Collection Time: 09/12/18  8:30 AM  Result Value Ref Range Status    MRSA by PCR NEGATIVE NEGATIVE Final    Comment:        The GeneXpert MRSA Assay (FDA approved for NASAL specimens only), is one component of a comprehensive MRSA colonization surveillance program. It is not intended to diagnose MRSA infection nor to guide or monitor treatment for MRSA infections. Performed at [facility], [place] [address] [place], [place] [address]      Labs: Basic Metabolic Panel: Recent Labs  Lab 09/11/18 2026 09/12/18 0528 09/13/18 0249 09/14/18 0246 09/15/18 0321  NA 132* 134* 131* 136 136  K 3.9 3.1* 2.9* 3.8 3.8  CL 93* 98 96* 104 103  CO2 _0 GLUCOSE 263* 84 88 81 235*  BUN 8 6 <5* <5* 5*  CREATININE 0.91 0.74 0.73 0.86 0.72  CALCIUM 8.4* 8.3* 8.2* 8.1* 8.1*   Liver Function Tests: Recent Labs  Lab 09/11/18 1750 09/12/18 0528 09/13/18 0249 09/14/18 0246 09/15/18 0321  AST 842* 504* 286* 150* 82*  ALT 356* 240* 196* 134* 102*  ALKPHOS 167* 117 113 100 107  BILITOT 1.9* 1.7* 1.6* 1.1 0.9  PROT 8.0 6.0* 6.2* 5.2* 5.1*  ALBUMIN 3.8 2.8* 2.9* 2.4* 2.3*   Recent Labs  Lab 09/11/18 1750  LIPASE 19   No results for input(s): AMMONIA in the last 168 hours. CBC: Recent Labs  Lab 09/11/18 1750 09/11/18 1754 09/12/18 0528 09/14/18 0246  WBC 8.3  --  5.9 4.5  HGB 13.8 15.6 11.0* 9.4*  HCT 39.2 46.0 31.0* 27.8*  MCV 85.2  --  87.3 90.6  PLT 291  --  196 155   Cardiac Enzymes: No results for input(s): CKTOTAL, CKMB, CKMBINDEX, TROPONINI in the last 168 hours. BNP: BNP (last 3 results) No results for input(s): BNP in the last 8760 hours.  ProBNP (last 3 results) No results for input(s): PROBNP in the last 8760 hours.  CBG: Recent Labs  Lab 09/14/18 1928 09/14/18 2331 09/15/18 0321 09/15/18 0741 09/15/18 1211  GLUCAP 200*  233* 245* 84 385*       Signed:  Oswald Hillock MD.  [facility] 09/15/2018, 3:36 PM

## 2018-09-15 NOTE — Progress Notes (Addendum)
Inpatient Diabetes Program Recommendations  AACE/ADA: New Consensus Statement on Inpatient Glycemic Control (2015)  Target Ranges:  Prepandial:   less than 140 mg/dL      Peak postprandial:   less than 180 mg/dL (1-2 hours)      Critically ill patients:  140 - 180 mg/dL   Results for Willie [name], Willie [name] (MRN [id number]) as of 09/15/2018 07:50  Ref. Range 09/13/2018 23:06 09/14/2018 04:38 09/14/2018 08:04 09/14/2018 12:13 09/14/2018 16:27 09/14/2018 19:28  Glucose-Capillary Latest Ref Range: 70 - 99 mg/dL 275 (H)  5 units NOVOLOG  158 (H)  2 units NOVOLOG  136 (H)  1 unit NOVOLOG  102 (H) 268 (H)  5 units NOVOLOG  200 (H)  2 units NOVOLOG +  8 units LANTUS given at 10pm    Results for Willie [name], Willie [name] (MRN [id number]) as of 09/15/2018 07:50  Ref. Range 09/14/2018 23:31 09/15/2018 03:21 09/15/2018 07:41  Glucose-Capillary Latest Ref Range: 70 - 99 mg/dL 233 (H)  3 units NOVOLOG  245 (H)  3 units NOVOLOG  97     Admit with: ETOH Hepatitis  History: DM, ETOH Abuse, Recurrent Pancreatitis  Home DM Meds: Lantus 25 units QHS                             Humalog 5 units with Breakfast/ 15 units with Lunch/ 10 units with Dinner  Current Orders: Novolog Sensitive Correction Scale/ SSI (0-9 units) Q4 hours      Lantus 8 units QHS     Per MD notes: Pt reports he normally drinks ~5-10 shots of liquor daily and reports h/o alcohol withdrawal but no withdrawal seizures.  Hasn't taken insulin in 2 days and doesn't check his CBGs.      MD- Note Lantus 8 units QHS started last PM.  Patient is eating solid PO diet per documentation.   Please consider the following in-hospital insulin adjustments:   1. Change timing of Novolog SSi to tid ac + hs (currently ordered Q4 hours and pt eating solid diet)   2. Start Novolog Meal Coverage: Novolog 3 units TID with meals (portion of home dose)  (Please add the following Hold Parameters: Hold if pt eats <50% of meal, Hold if pt  NPO)    Addendum 2:45pm- Met with pt this afternoon.  Verified home insulins.  Has not been taking insulin consistently since January b/c of loss of insurance.  Care Management team has set pt up to get Lantus and Humalog at the [facility] clinic pharmacy after d/c and then pharmacy will give pt paperwork for manufacturer assistance program that will need to be completed by his PCP.  Pt states he takes Lantus 25 unts QHS + Humalog 5 units with small meal, 10 units with medium sized meal, and 15 units with large meal.  Reminded pt about the importance of checking CBGs and taking the appropriate amount of insulin for good CBG control.  Encouraged pt to take better control of his CBGs and his overall health.  Also reminded pt that he can purchase CBG meter OTC at [facility] for $9 if the [facility] clinic pharmacy cannot help him with the cost of insulin strips for his current CBG meter.  Pt stated he understood and was ready to go home.     --Will follow patient during hospitalization--  Wyn Quaker RN, MSN, CDE Diabetes Coordinator Inpatient Glycemic Control Team Team Pager: [phone] (8a-5p)

## 2018-09-15 NOTE — Telephone Encounter (Signed)
Please contact pt to schedule a hospital follow up with me. OK for virtual visit.

## 2018-09-15 NOTE — TOC Progression Note (Signed)
Transition of Care [facility]) - Progression Note    Patient Details  Name: Willie [name] MRN: [id number] Date of Birth: Jan 23, 1976  [facility]) CM/SW Contact  Joaquin Courts, RN Phone Number: 09/15/2018, 11:54 AM  Clinical Narrative:  CM spoke with patient regarding medication needs.  Patient reports he lost his insurance as of January 1 due to divorce and has ran out of insulin and test strips.  CM placed information for [facility] on patients discharge paperwork and advised patient that [facility] can fill insulin prescriptions and patient can receive an application for assistance that will need to be filled out by his PCP.  Additionally, the [facility] can fill the script for test strips.  However, should his current brand be unavailable or too expensive, pt can purchase a new meter and strips from [facility] at a cost of nine dollars. Patient has no further concerns at this time.        Expected Discharge Plan: Home/Self Care Barriers to Discharge: Continued Medical Work up  Expected Discharge Plan and Services Expected Discharge Plan: Home/Self Care       Living arrangements for the past 2 months: Single Family Home Expected Discharge Date: 09/14/18                                     Social Determinants of Health (SDOH) Interventions    Readmission Risk Interventions Readmission Risk Prevention Plan 09/14/2018  Transportation Screening Complete  PCP or Specialist Appt within 3-5 Days Not Complete  Not Complete comments no yet ready for d/c  HRI or [place] Not Complete  HRI or Home Care Consult comments no needs identified at this time  Social Work Consult for [place] Planning/Counseling Not Complete  SW consult not completed comments no needs identified at this time  Palliative Care Screening Not Applicable  Medication Review Press photographer) Complete  Some recent data might be hidden

## 2018-09-16 LAB — CULTURE, BLOOD (ROUTINE X 2)
Culture: NO GROWTH
Culture: NO GROWTH
Special Requests: ADEQUATE

## 2018-09-23 ENCOUNTER — Other Ambulatory Visit: Payer: Self-pay

## 2018-09-23 ENCOUNTER — Ambulatory Visit: Payer: Self-pay | Admitting: Family

## 2018-09-23 DIAGNOSIS — Z91199 Patient's noncompliance with other medical treatment and regimen due to unspecified reason: Secondary | ICD-10-CM

## 2018-09-23 DIAGNOSIS — Z5329 Procedure and treatment not carried out because of patient's decision for other reasons: Secondary | ICD-10-CM

## 2018-09-23 NOTE — Progress Notes (Signed)
Patient was unavailable by phone of video to complete visit today despite multiple attempts.

## 2018-10-03 ENCOUNTER — Other Ambulatory Visit: Payer: Self-pay | Admitting: Family

## 2018-10-06 MED FILL — LORazepam 0.5 MG TABS: 0.5 | 10 days supply | Qty: 30 | Fill #0

## 2018-10-06 MED FILL — ESCITALOPRAM 20 MG TABLET: 20 | 90 days supply | Qty: 90 | Fill #0

## 2018-10-21 ENCOUNTER — Encounter: Payer: Self-pay | Admitting: Endocrinology

## 2018-11-19 ENCOUNTER — Encounter: Payer: Self-pay | Admitting: Family

## 2018-11-22 ENCOUNTER — Inpatient Hospital Stay (HOSPITAL_BASED_OUTPATIENT_CLINIC_OR_DEPARTMENT_OTHER)
Admission: EM | Admit: 2018-11-22 | Discharge: 2018-11-28 | DRG: 026 | Disposition: A | Discharge status: Home / Self Care | Payer: Self-pay | Attending: Neurosurgery | Admitting: Neurosurgery

## 2018-11-22 ENCOUNTER — Emergency Department (HOSPITAL_BASED_OUTPATIENT_CLINIC_OR_DEPARTMENT_OTHER): Payer: Self-pay

## 2018-11-22 ENCOUNTER — Other Ambulatory Visit: Payer: Self-pay

## 2018-11-22 ENCOUNTER — Encounter (HOSPITAL_BASED_OUTPATIENT_CLINIC_OR_DEPARTMENT_OTHER): Payer: Self-pay | Admitting: Emergency Medicine

## 2018-11-22 DIAGNOSIS — I6200 Nontraumatic subdural hemorrhage, unspecified: Secondary | ICD-10-CM | POA: Diagnosis present

## 2018-11-22 DIAGNOSIS — Z833 Family history of diabetes mellitus: Secondary | ICD-10-CM

## 2018-11-22 DIAGNOSIS — K860 Alcohol-induced chronic pancreatitis: Secondary | ICD-10-CM | POA: Diagnosis present

## 2018-11-22 DIAGNOSIS — R1084 Generalized abdominal pain: Secondary | ICD-10-CM

## 2018-11-22 DIAGNOSIS — R945 Abnormal results of liver function studies: Secondary | ICD-10-CM | POA: Diagnosis present

## 2018-11-22 DIAGNOSIS — I160 Hypertensive urgency: Secondary | ICD-10-CM | POA: Diagnosis present

## 2018-11-22 DIAGNOSIS — Z794 Long term (current) use of insulin: Secondary | ICD-10-CM

## 2018-11-22 DIAGNOSIS — R109 Unspecified abdominal pain: Secondary | ICD-10-CM | POA: Diagnosis present

## 2018-11-22 DIAGNOSIS — R279 Unspecified lack of coordination: Secondary | ICD-10-CM | POA: Diagnosis present

## 2018-11-22 DIAGNOSIS — K861 Other chronic pancreatitis: Secondary | ICD-10-CM | POA: Diagnosis present

## 2018-11-22 DIAGNOSIS — K219 Gastro-esophageal reflux disease without esophagitis: Secondary | ICD-10-CM | POA: Diagnosis present

## 2018-11-22 DIAGNOSIS — F1010 Alcohol abuse, uncomplicated: Secondary | ICD-10-CM | POA: Diagnosis present

## 2018-11-22 DIAGNOSIS — E876 Hypokalemia: Secondary | ICD-10-CM | POA: Diagnosis present

## 2018-11-22 DIAGNOSIS — IMO0002 Reserved for concepts with insufficient information to code with codable children: Secondary | ICD-10-CM | POA: Diagnosis present

## 2018-11-22 DIAGNOSIS — F10930 Alcohol use, unspecified with withdrawal, uncomplicated: Secondary | ICD-10-CM

## 2018-11-22 DIAGNOSIS — S065X9A Traumatic subdural hemorrhage with loss of consciousness of unspecified duration, initial encounter: Principal | ICD-10-CM | POA: Diagnosis present

## 2018-11-22 DIAGNOSIS — F418 Other specified anxiety disorders: Secondary | ICD-10-CM | POA: Diagnosis present

## 2018-11-22 DIAGNOSIS — I10 Essential (primary) hypertension: Secondary | ICD-10-CM | POA: Diagnosis present

## 2018-11-22 DIAGNOSIS — S065XAA Traumatic subdural hemorrhage with loss of consciousness status unknown, initial encounter: Secondary | ICD-10-CM

## 2018-11-22 DIAGNOSIS — F10230 Alcohol dependence with withdrawal, uncomplicated: Secondary | ICD-10-CM | POA: Diagnosis present

## 2018-11-22 DIAGNOSIS — W1800XA Striking against unspecified object with subsequent fall, initial encounter: Secondary | ICD-10-CM | POA: Diagnosis present

## 2018-11-22 DIAGNOSIS — Z79899 Other long term (current) drug therapy: Secondary | ICD-10-CM

## 2018-11-22 DIAGNOSIS — E785 Hyperlipidemia, unspecified: Secondary | ICD-10-CM | POA: Diagnosis present

## 2018-11-22 DIAGNOSIS — E1165 Type 2 diabetes mellitus with hyperglycemia: Secondary | ICD-10-CM | POA: Diagnosis present

## 2018-11-22 DIAGNOSIS — I493 Ventricular premature depolarization: Secondary | ICD-10-CM | POA: Diagnosis present

## 2018-11-22 DIAGNOSIS — X58XXXA Exposure to other specified factors, initial encounter: Secondary | ICD-10-CM | POA: Diagnosis present

## 2018-11-22 DIAGNOSIS — G932 Benign intracranial hypertension: Secondary | ICD-10-CM | POA: Diagnosis present

## 2018-11-22 DIAGNOSIS — Z20828 Contact with and (suspected) exposure to other viral communicable diseases: Secondary | ICD-10-CM | POA: Diagnosis present

## 2018-11-22 DIAGNOSIS — G4733 Obstructive sleep apnea (adult) (pediatric): Secondary | ICD-10-CM | POA: Diagnosis present

## 2018-11-22 DIAGNOSIS — R7989 Other specified abnormal findings of blood chemistry: Secondary | ICD-10-CM | POA: Diagnosis present

## 2018-11-22 DIAGNOSIS — E11649 Type 2 diabetes mellitus with hypoglycemia without coma: Secondary | ICD-10-CM | POA: Diagnosis present

## 2018-11-22 LAB — CBC WITH DIFFERENTIAL/PLATELET
Abs Immature Granulocytes: 0.02 10*3/uL (ref 0.00–0.07)
Basophils Absolute: 0.1 10*3/uL (ref 0.0–0.1)
Basophils Relative: 1 %
Eosinophils Absolute: 0.1 10*3/uL (ref 0.0–0.5)
Eosinophils Relative: 2 %
HCT: 37.4 % — ABNORMAL LOW (ref 39.0–52.0)
Hemoglobin: 12.4 g/dL — ABNORMAL LOW (ref 13.0–17.0)
Immature Granulocytes: 0 %
Lymphocytes Relative: 9 %
Lymphs Abs: 0.4 10*3/uL — ABNORMAL LOW (ref 0.7–4.0)
MCH: 30 pg (ref 26.0–34.0)
MCHC: 33.2 g/dL (ref 30.0–36.0)
MCV: 90.3 fL (ref 80.0–100.0)
Monocytes Absolute: 0.4 10*3/uL (ref 0.1–1.0)
Monocytes Relative: 8 %
Neutro Abs: 3.7 10*3/uL (ref 1.7–7.7)
Neutrophils Relative %: 80 %
Platelets: 433 10*3/uL — ABNORMAL HIGH (ref 150–400)
RBC: 4.14 MIL/uL — ABNORMAL LOW (ref 4.22–5.81)
RDW: 14.8 % (ref 11.5–15.5)
WBC: 4.7 10*3/uL (ref 4.0–10.5)
nRBC: 0 % (ref 0.0–0.2)

## 2018-11-22 LAB — COMPREHENSIVE METABOLIC PANEL
ALT: 70 U/L — ABNORMAL HIGH (ref 0–44)
AST: 152 U/L — ABNORMAL HIGH (ref 15–41)
Albumin: 3 g/dL — ABNORMAL LOW (ref 3.5–5.0)
Alkaline Phosphatase: 160 U/L — ABNORMAL HIGH (ref 38–126)
Anion gap: 18 — ABNORMAL HIGH (ref 5–15)
BUN: <5 mg/dL — ABNORMAL LOW (ref 6–20)
CO2: 23 mmol/L (ref 22–32)
Calcium: 8.2 mg/dL — ABNORMAL LOW (ref 8.9–10.3)
Chloride: 96 mmol/L — ABNORMAL LOW (ref 98–111)
Creatinine, Ser: 0.87 mg/dL (ref 0.61–1.24)
GFR calc Af Amer: >60 mL/min (ref >60)
GFR calc non Af Amer: >60 mL/min (ref >60)
Glucose, Bld: 233 mg/dL — ABNORMAL HIGH (ref 70–99)
Potassium: 4 mmol/L (ref 3.5–5.1)
Sodium: 137 mmol/L (ref 135–145)
Total Bilirubin: 1.1 mg/dL (ref 0.3–1.2)
Total Protein: 6.4 g/dL — ABNORMAL LOW (ref 6.5–8.1)

## 2018-11-22 LAB — PROTIME-INR
INR: 1 (ref 0.8–1.2)
Prothrombin Time: 13.1 seconds (ref 11.4–15.2)

## 2018-11-22 LAB — SARS CORONAVIRUS 2 BY RT PCR (HOSPITAL ORDER, PERFORMED IN ~~LOC~~ HOSPITAL LAB): SARS Coronavirus 2: NEGATIVE

## 2018-11-22 LAB — CBG MONITORING, ED: Glucose-Capillary: 168 mg/dL — ABNORMAL HIGH (ref 70–99)

## 2018-11-22 LAB — LIPASE, BLOOD: Lipase: 16 U/L (ref 11–51)

## 2018-11-22 LAB — APTT: aPTT: 27 seconds (ref 24–36)

## 2018-11-22 MED ORDER — LORAZEPAM 1 MG PO TABS
0.0000 mg | ORAL_TABLET | Freq: Four times a day (QID) | ORAL | Status: AC
  Administered 2018-11-23: 1 mg via ORAL
  Administered 2018-11-23: 2 mg via ORAL
  Filled 2018-11-22: qty 2
  Filled 2018-11-22: qty 1

## 2018-11-22 MED ORDER — LORAZEPAM 2 MG/ML IJ SOLN: 0.0000 mg | Freq: Two times a day (BID) | INTRAMUSCULAR | Status: DC

## 2018-11-22 MED ORDER — LORAZEPAM 2 MG/ML IJ SOLN
0.0000 mg | Freq: Four times a day (QID) | INTRAMUSCULAR | Status: AC
  Administered 2018-11-23: 2 mg via INTRAVENOUS
  Administered 2018-11-24: 1 mg via INTRAVENOUS
  Filled 2018-11-22 (×2): qty 1

## 2018-11-22 MED ORDER — AMLODIPINE BESYLATE 10 MG PO TABS
10.0000 mg | ORAL_TABLET | Freq: Every day | ORAL | Status: DC
  Administered 2018-11-23: 10 mg via ORAL
  Filled 2018-11-22: qty 1

## 2018-11-22 MED ORDER — INSULIN GLARGINE 100 UNIT/ML ~~LOC~~ SOLN
14.0000 [IU] | Freq: Every day | SUBCUTANEOUS | Status: DC
  Administered 2018-11-23 (×2): 14 [IU] via SUBCUTANEOUS
  Filled 2018-11-22 (×3): qty 0.14

## 2018-11-22 MED ORDER — CLONIDINE HCL 0.1 MG PO TABS
0.1000 mg | ORAL_TABLET | Freq: Three times a day (TID) | ORAL | Status: DC
  Administered 2018-11-23: 0.1 mg via ORAL
  Filled 2018-11-22: qty 1

## 2018-11-22 MED ORDER — LORAZEPAM 1 MG PO TABS: 0.0000 mg | ORAL_TABLET | Freq: Two times a day (BID) | ORAL | Status: DC

## 2018-11-22 MED ORDER — PROMETHAZINE HCL 25 MG/ML IJ SOLN: 10.0000 mg | Freq: Once | INTRAMUSCULAR | Status: DC

## 2018-11-22 MED ORDER — ONDANSETRON HCL 4 MG/2ML IJ SOLN
4.0000 mg | Freq: Once | INTRAMUSCULAR | Status: AC
  Administered 2018-11-22: 4 mg via INTRAVENOUS
  Filled 2018-11-22: qty 2

## 2018-11-22 MED ORDER — VITAMIN B-1 100 MG PO TABS
100.0000 mg | ORAL_TABLET | Freq: Every day | ORAL | Status: DC
  Administered 2018-11-23 – 2018-11-25 (×3): 100 mg via ORAL
  Filled 2018-11-22 (×3): qty 1

## 2018-11-22 MED ORDER — PROMETHAZINE HCL 25 MG/ML IJ SOLN
INTRAMUSCULAR | Status: AC
  Filled 2018-11-22: qty 1

## 2018-11-22 MED ORDER — FENTANYL CITRATE (PF) 100 MCG/2ML IJ SOLN: 50.0000 ug | Freq: Once | INTRAMUSCULAR | Status: DC

## 2018-11-22 MED ORDER — SODIUM CHLORIDE 0.9 % IV BOLUS
1000.0000 mL | Freq: Once | INTRAVENOUS | Status: AC
  Administered 2018-11-22: 1000 mL via INTRAVENOUS

## 2018-11-22 MED ORDER — ESCITALOPRAM OXALATE 10 MG PO TABS
20.0000 mg | ORAL_TABLET | Freq: Every day | ORAL | Status: DC
  Administered 2018-11-23 – 2018-11-28 (×7): 20 mg via ORAL
  Filled 2018-11-22 (×8): qty 2

## 2018-11-22 MED ORDER — PROMETHAZINE HCL 25 MG/ML IJ SOLN
10.0000 mg | Freq: Once | INTRAMUSCULAR | Status: AC
  Administered 2018-11-22: 10 mg via INTRAVENOUS

## 2018-11-22 MED ORDER — ZOLPIDEM TARTRATE 5 MG PO TABS
5.0000 mg | ORAL_TABLET | Freq: Every evening | ORAL | Status: DC | PRN
  Administered 2018-11-23 – 2018-11-27 (×5): 5 mg via ORAL
  Filled 2018-11-22 (×5): qty 1

## 2018-11-22 MED ORDER — THIAMINE HCL 100 MG/ML IJ SOLN
100.0000 mg | Freq: Every day | INTRAMUSCULAR | Status: DC
  Administered 2018-11-22: 100 mg via INTRAVENOUS
  Filled 2018-11-22: qty 2

## 2018-11-22 MED ORDER — FENTANYL CITRATE (PF) 100 MCG/2ML IJ SOLN
50.0000 ug | Freq: Once | INTRAMUSCULAR | Status: AC
  Administered 2018-11-22: 50 ug via INTRAVENOUS
  Filled 2018-11-22: qty 2

## 2018-11-22 MED ORDER — FENTANYL CITRATE (PF) 100 MCG/2ML IJ SOLN
50.0000 ug | Freq: Once | INTRAMUSCULAR | Status: AC
  Administered 2018-11-22: 20:00:00 50 ug via INTRAVENOUS
  Filled 2018-11-22: qty 2

## 2018-11-22 MED ORDER — PANTOPRAZOLE SODIUM 40 MG PO TBEC
40.0000 mg | DELAYED_RELEASE_TABLET | Freq: Two times a day (BID) | ORAL | Status: DC
  Administered 2018-11-23 – 2018-11-24 (×4): 40 mg via ORAL
  Filled 2018-11-22 (×4): qty 1

## 2018-11-22 MED ORDER — LORAZEPAM 2 MG/ML IJ SOLN
0.5000 mg | Freq: Once | INTRAMUSCULAR | Status: AC
  Administered 2018-11-22: 0.5 mg via INTRAVENOUS
  Filled 2018-11-22: qty 1

## 2018-11-22 MED ORDER — QUETIAPINE FUMARATE 200 MG PO TABS
300.0000 mg | ORAL_TABLET | Freq: Every day | ORAL | Status: DC
  Administered 2018-11-23 – 2018-11-27 (×6): 300 mg via ORAL
  Filled 2018-11-22: qty 6
  Filled 2018-11-22 (×3): qty 1
  Filled 2018-11-22: qty 6
  Filled 2018-11-22 (×2): qty 1

## 2018-11-22 MED ORDER — LISINOPRIL 20 MG PO TABS: 20.0000 mg | ORAL_TABLET | Freq: Every day | ORAL | Status: DC

## 2018-11-22 MED ORDER — ADULT MULTIVITAMIN W/MINERALS CH
1.0000 | ORAL_TABLET | Freq: Every day | ORAL | Status: DC
  Administered 2018-11-23 – 2018-11-25 (×3): 1 via ORAL
  Filled 2018-11-22 (×3): qty 1

## 2018-11-22 NOTE — Progress Notes (Signed)
Patient is a 43 year old with past medical history significant for diabetes mellitus, sleep apnea, alcohol abuse, alcoholic pancreatitis, hypertension and hyperlipidemia.  Patient was said to have fallen and hit his head at [facility] last week with subsequent bilateral subdural hematoma.  Patient was seen at a local hospital at [facility] and evacuation of the subdural hematoma was advised but patient left the hospital [employer].  The ER provider at [facility] emergency room, Dr. Lita Mains tells me that the patient's current medical problem is epigastric pain, nausea and vomiting.  As documented above, patient has significant alcohol use history.  Dr. Lita Mains wants medical team to admit patient.  Neurosurgery team has already seen patient vis--vis bilateral subdural hematoma (kindly the neurosurgery documentation).  Please call MD on patient's arrival.

## 2018-11-22 NOTE — ED Notes (Signed)
Pt dry heaving and sts nausea is back

## 2018-11-22 NOTE — Progress Notes (Signed)
  NEUROSURGERY PROGRESS NOTE   Received call from Dr Lita Mains, [place] at [facility] regarding patient. 43 year old with history alcohol abuse, DM who was in [facility] when he fell and struck head 7/18. Went to ED there and was advised he had "blood clot on brain". Advised by EDP reviewing records CT showed b/l SDH: right 84mm, left 72mm. It was rec he have surgery for evacuation of left SDH but left AMA. He is now back in [place] and is okay with having surgery. By report has grossly normal strength, + N/V, potentially going through w/d. Reviewed with attending, Dr Vertell Limber. No need for emergent NS intervention, but will likely need b/l crani for evacuation. Would appreciate TH admission for management of w/d and for tune up prior to any surgical intervention.  Would aim for Tuesday if okay medically. Rec q 2 hour neuro checks. Please call when patient arrives at [facility].   Ferne Reus, PA-C [facility] [employer]

## 2018-11-22 NOTE — ED Notes (Signed)
Pt requesting something for [facility]; EDP notified

## 2018-11-22 NOTE — ED Notes (Signed)
Received verbal orders from [facility] MD for pain and nausea meds, put them in under wrong physician. Incorrect orders discontinued and replaced with correct ordering provider.

## 2018-11-22 NOTE — ED Triage Notes (Addendum)
States he just got back from [place] and went to hospital and was told he had a blood clot in his brain. Unclear about when he actually fell and when he got back . States," I came here to have surgery on my head because of the blood clot" . Last ETOH was last night

## 2018-11-22 NOTE — ED Provider Notes (Signed)
[facility] EMERGENCY DEPARTMENT Provider Note   CSN: [id number] Arrival date & time: 11/22/18  1607    History   Chief Complaint Chief Complaint  Patient presents with  . Head Injury    HPI Willie [name] is a 43 y.o. male.     HPI Patient states that last week he was in [facility] and had a syncopal episode.  Was taken to the emergency department and diagnosed with "blood clots in his brain".  States that they wanted to perform surgery but patient left AMA.  He is here due to worsening nausea and vomiting which started last night.  He is also complaining of upper abdominal pain and headache.  Admits to drinking several beers last night but this was much less than he normally drinks.  Denies any focal weakness or numbness.  States he is willing to have surgery currently. Past Medical History:  Diagnosis Date  . Alcohol abuse   . Alcoholic pancreatitis    recurrent  . Depression   . Diabetes mellitus, type II ([place])    New [place]set 03/2010  . GERD (gastroesophageal reflux disease)   . High cholesterol   . 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 Hypertension   . Sleep apnea    does not wear a CPAP    Patient Active Problem List   Diagnosis Date Noted  . Abdominal pain 11/22/2018  . IDDM (insulin dependent diabetes mellitus) ([place]) 09/12/2018  . Acute hepatitis 09/11/2018  . Alcohol-induced mood disorder ([place])   . DKA (diabetic ketoacidoses) ([place]) 07/28/2017  . Alcohol withdrawal ([place]) 07/28/2017  . Abnormal CT scan, colon   . Benign neoplasm of descending colon   . Lower GI bleed 07/04/2017  . Transaminitis 07/04/2017  . Pain   . Bleeding internal hemorrhoids   . Anemia of chronic disease   . Left arm weakness   . Anemia, blood loss   . Vomiting alone 03/27/2017  . Chest pain 03/27/2017  . Nausea and vomiting   . Lactic acidosis 03/26/2017  . History of alcohol abuse 06/25/2016  . Tobacco abuse  06/25/2016  . Anxiety state 11/20/2015  . Dental infection 07/10/2015  . Mood disorder ([place]) 04/18/2015  . Eczema 06/27/2014  . GERD (gastroesophageal reflux disease) 05/24/2014  . Snoring 04/07/2013  . Suicide attempt by substance overdose ([place]) 02/10/2013  . Alcohol abuse 12/31/2012  . Erectile dysfunction 12/09/2012  . Hematuria 06/11/2012  . Vision problem 02/02/2012  . Insomnia 12/20/2011  . Chronic pancreatitis ([place]) 02/05/2011  . Depression with anxiety 11/13/2010  . Hyperlipemia 06/27/2010  . Diabetes type 2, uncontrolled ([place]) 05/22/2010  . Essential hypertension 05/22/2010    Past Surgical History:  Procedure Laterality Date  . COLONOSCOPY W/ BIOPSIES  09/2010   Dr. Ardis Hughs.  for intermittent rectal bleeding.  Mild sigmoid to descending diverticulosis.  Mild left colon erythema, benign biopsy, probably "prep effect"  . COLONOSCOPY WITH PROPOFOL N/A 07/06/2017   Procedure: COLONOSCOPY WITH PROPOFOL;  Surgeon: Jerene Bears, MD;  Location: [facility] ENDOSCOPY;  Service: Gastroenterology;  Laterality: N/A;  . LUMBAR MICRODISCECTOMY  ~ 2004   Dr Ellene Route        Home Medications    Prior to Admission medications   Medication Sig Start Date End Date Taking? Authorizing Provider  ALPRAZolam Duanne Moron) 1 MG tablet Take 1 tablet (1 mg total) by mouth 3 (three) times daily as needed for anxiety. 09/15/18  Oswald Hillock, MD  amLODipine (NORVASC) 10 MG tablet Take 1 tablet (10 mg total) by mouth daily. 08/17/18   Debbrah Alar, NP  blood glucose meter kit and supplies KIT Dispense based on patient and insurance preference. Use up to four times daily as directed. (FOR ICD-9 250.00, 250.01). 07/24/18   Debbrah Alar, NP  CIALIS 10 MG tablet TAKE 1 TABLET BY MOUTH DAILY AS NEEDED FOR ERECTILE DYSFUNCTION Patient taking differently: Take 10 mg by mouth daily as needed for erectile dysfunction.  07/09/18   Debbrah Alar, NP  cloNIDine (CATAPRES) 0.1 MG tablet Take 1 tablet (0.1 mg  total) by mouth 3 (three) times daily. 08/17/18   Debbrah Alar, NP  Continuous Blood Gluc Sensor (FREESTYLE LIBRE 14 DAY SENSOR) MISC Inject 1 each into the skin every 14 (fourteen) days. 04/27/18   Debbrah Alar, NP  cyclobenzaprine (FLEXERIL) 5 MG tablet Take 1 tablet (5 mg total) by mouth 2 (two) times daily as needed for muscle spasms. 11/24/17   Horton, Barbette Hair, MD  escitalopram (LEXAPRO) 20 MG tablet Take 1 tablet (20 mg total) by mouth daily. 08/17/18   Debbrah Alar, NP  folic acid (FOLVITE) 1 MG tablet Take 1 tablet (1 mg total) by mouth daily. 08/29/17   Debbrah Alar, NP  glucose blood (FREESTYLE LITE) test strip Use as instructed 07/24/18   Debbrah Alar, NP  Insulin Glargine (LANTUS SOLOSTAR) 100 UNIT/ML Solostar Pen Inject 25 Units into the skin at bedtime. 08/17/18   Debbrah Alar, NP  insulin lispro (HUMALOG KWIKPEN) 100 UNIT/ML KwikPen Inject 0.05-0.15 mLs (5-15 Units total) into the skin 3 (three) times daily. Inject subcutaneously 3 times a day (just before each meal) 09-10-08 units Humalog 5 units with small meal, 10 units with medium sized meal, and 15 units with large meal. 09/15/18   Darrick Meigs, Marge Duncans, MD  Insulin Pen Needle (ULTICARE SHORT PEN NEEDLES) 31G X 8 MM MISC Use once daily to inject insulin. 04/27/18   Debbrah Alar, NP  Lancets (FREESTYLE) lancets 2 (two) times daily 02/27/17   Debbrah Alar, NP  lisinopril (ZESTRIL) 20 MG tablet Take 1 tablet (20 mg total) by mouth daily. 08/17/18   Debbrah Alar, NP  Multiple Vitamins-Minerals (MULTIVITAMIN) tablet Take 1 tablet by mouth daily. 02/12/16   Palumbo, April, MD  ondansetron (ZOFRAN ODT) 4 MG disintegrating tablet 73m ODT q4 hours prn nausea/vomit Patient taking differently: Take 4 mg by mouth every 4 (four) hours as needed for nausea or vomiting.  10/13/17   BMalvin Johns MD  pantoprazole (PROTONIX) 40 MG tablet Take 1 tablet (40 mg total) by mouth 2 (two) times daily.  08/17/18   ODebbrah Alar NP  QUEtiapine (SEROQUEL) 300 MG tablet Take 1 tablet (300 mg total) by mouth at bedtime. 08/17/18   ODebbrah Alar NP  sildenafil (REVATIO) 20 MG tablet Take 1-2 tabs by mouth once daily as needed 30-60 minutes prior to sexual activity. 07/24/18   ODebbrah Alar NP  thiamine 100 MG tablet Take 1 tablet (100 mg total) by mouth daily. 03/20/18   Saguier, EPercell Miller PA-C  zolpidem (AMBIEN) 5 MG tablet Take 1 tablet (5 mg total) by mouth at bedtime as needed. for sleep 08/18/18   ODebbrah Alar NP    Family History Family History  Problem Relation Age of Onset  . Diabetes Mother   . Lung cancer Brother        twin brother  . Pancreatic cancer Paternal Aunt   . Colon cancer Neg Hx   .  Stomach cancer Neg Hx     Social History Social History   Tobacco Use  . Smoking status: Former Smoker    Packs/day: 0.25    Years: 1.00    Pack years: 0.25    Types: Cigarettes    Start date: 04/29/2016  . Smokeless tobacco: Never Used  . Tobacco comment: 4 Cigarettes a day   Substance Use Topics  . Alcohol use: Yes    Comment: 12 pack beer a day  . Drug use: Not Currently    Comment: none in years     Allergies   Invokamet [canagliflozin-metformin hcl] and Metformin and related   Review of Systems Review of Systems  Constitutional: Negative for chills and fever.  HENT: Negative for sore throat and trouble swallowing.   Eyes: Negative for visual disturbance.  Respiratory: Negative for cough and shortness of breath.   Cardiovascular: Negative for chest pain, palpitations and leg swelling.  Gastrointestinal: Positive for abdominal pain, nausea and vomiting. Negative for constipation and diarrhea.  Musculoskeletal: Negative for myalgias and neck pain.  Skin: Negative for rash and wound.  Neurological: Positive for headaches. Negative for dizziness, weakness, light-headedness and numbness.  All other systems reviewed and are negative.    Physical  Exam Updated Vital Signs BP (!) 154/103   Pulse 100   Temp 98.4 F (36.9 C) (Oral)   Resp 16   Ht 6' (1.829 m)   Wt 61.2 kg   SpO2 98%   BMI 18.31 kg/m   Physical Exam Vitals signs and nursing note reviewed.  Constitutional:      Appearance: Normal appearance. He is well-developed.  HENT:     Head: Normocephalic and atraumatic.     Comments: No facial asymmetry.  No obvious head trauma.    Nose: Nose normal.     Mouth/Throat:     Mouth: Mucous membranes are moist.  Eyes:     Pupils: Pupils are equal, round, and reactive to light.     Comments: Right pupil is 4 mm and left pupil is 3 mm.  Both sluggish.  Neck:     Musculoskeletal: Normal range of motion and neck supple. No neck rigidity or muscular tenderness.  Cardiovascular:     Rate and Rhythm: Regular rhythm. Tachycardia present.     Heart sounds: No murmur. No friction rub. No gallop.   Pulmonary:     Effort: Pulmonary effort is normal. No respiratory distress.     Breath sounds: Normal breath sounds. No stridor. No wheezing, rhonchi or rales.  Chest:     Chest wall: No tenderness.  Abdominal:     General: Bowel sounds are normal.     Palpations: Abdomen is soft.     Tenderness: There is abdominal tenderness. There is no guarding or rebound.     Comments: Epigastric tenderness to palpation.  Musculoskeletal: Normal range of motion.        General: No swelling, tenderness, deformity or signs of injury.     Right lower leg: No edema.     Left lower leg: No edema.  Lymphadenopathy:     Cervical: No cervical adenopathy.  Skin:    General: Skin is warm and dry.     Capillary Refill: Capillary refill takes less than 2 seconds.     Findings: No erythema or rash.  Neurological:     General: No focal deficit present.     Mental Status: He is alert and oriented to person, place, and time.  Comments: Mildly tremulous.  5/5 motor in all extremities.  Sensation fully intact.  Psychiatric:        Behavior: Behavior  normal.      ED Treatments / Results  Labs (all labs ordered are listed, but only abnormal results are displayed) Labs Reviewed  CBC WITH DIFFERENTIAL/PLATELET - Abnormal; Notable for the following components:      Result Value   RBC 4.14 (*)    Hemoglobin 12.4 (*)    HCT 37.4 (*)    Platelets 433 (*)    Lymphs Abs 0.4 (*)    All other components within normal limits  COMPREHENSIVE METABOLIC PANEL - Abnormal; Notable for the following components:   Chloride 96 (*)    Glucose, Bld 233 (*)    BUN <5 (*)    Calcium 8.2 (*)    Total Protein 6.4 (*)    Albumin 3.0 (*)    AST 152 (*)    ALT 70 (*)    Alkaline Phosphatase 160 (*)    Anion gap 18 (*)    All other components within normal limits  CBG MONITORING, ED - Abnormal; Notable for the following components:   Glucose-Capillary 168 (*)    All other components within normal limits  SARS CORONAVIRUS 2 (HOSPITAL ORDER, [facility])  LIPASE, BLOOD  PROTIME-INR  APTT    EKG EKG Interpretation  Date/Time:  Sunday November 22 2018 18:08:20 EDT Ventricular Rate:  111 PR Interval:    QRS Duration: 85 QT Interval:  374 QTC Calculation: 509 R Axis:   73 Text Interpretation:  Sinus tachycardia Ventricular premature complex Prolonged QT interval Baseline wander in lead(s) V1 V2 V6 Confirmed by Julianne Rice [phone]) on 11/22/2018 6:31:38 PM   Radiology Ct Head Wo Contrast  Result Date: 11/22/2018 CLINICAL DATA:  Posttraumatic headache, nausea and vomiting, LEFT side head pain; just got back from [facility], [place], went to hospital and was told he we had a blood clot in his brain, unclear about when he actually fell and when he arrived home EXAM: CT HEAD WITHOUT CONTRAST TECHNIQUE: Contiguous axial images were obtained from the base of the skull through the vertex without intravenous contrast. Sagittal and coronal MPR images reconstructed from axial data set. COMPARISON:  06/07/2017 FINDINGS:  Brain: Normal ventricular morphology. Intermediate attenuation LEFT frontoparietal subdural hematoma measuring up to 14 mm thick. Mass effect upon the lateral aspect of the RIGHT hemisphere. Additionally, older appearing intermediate to low attenuation RIGHT frontoparietal subdural hematoma measuring up to 8 mm thick with mild mass effect upon the RIGHT hemisphere. No intraparenchymal hemorrhage, evidence acute infarct, or mass. 1 mm of LEFT-to-RIGHT midline shift. Vascular: Unremarkable Skull: Intact Sinuses/Orbits: Clear Other: N/A IMPRESSION: Intermediate attenuation LEFT frontoparietal subdural hematoma measuring 14 mm thick, exerting mass effect upon the LEFT hemisphere. Intermediate to low attenuation RIGHT frontal parietal subdural hematoma up to 8 mm thick with mild mass effect upon RIGHT hemisphere. No intraparenchymal hemorrhage or infarction. 1 mm LEFT-to-RIGHT midline shift. Critical Value/emergent results were called by telephone at the time of interpretation on 11/22/2018 at 5:31 pm to Dr. Julianne Rice , who verbally acknowledged these results. Electronically Signed   By: Lavonia Dana M.D.   On: 11/22/2018 17:31    Procedures Procedures (including critical care time)  Medications Ordered in ED Medications  LORazepam (ATIVAN) injection 0-4 mg (0 mg Intravenous Not Given 11/22/18 1900)    Or  LORazepam (ATIVAN) tablet 0-4 mg ( Oral  See Alternative 11/22/18 1900)  LORazepam (ATIVAN) injection 0-4 mg (has no administration in time range)    Or  LORazepam (ATIVAN) tablet 0-4 mg (has no administration in time range)  thiamine (VITAMIN B-1) tablet 100 mg ( Oral See Alternative 11/22/18 1837)    Or  thiamine (B-1) injection 100 mg (100 mg Intravenous Given 11/22/18 1837)  promethazine (PHENERGAN) 25 MG/ML injection (has no administration in time range)  ondansetron (ZOFRAN) injection 4 mg (4 mg Intravenous Given 11/22/18 1735)  LORazepam (ATIVAN) injection 0.5 mg (0.5 mg Intravenous Given  11/22/18 1736)  sodium chloride 0.9 % bolus 1,000 mL ( Intravenous Stopped 11/22/18 1848)  fentaNYL (SUBLIMAZE) injection 50 mcg (50 mcg Intravenous Given 11/22/18 1943)  ondansetron (ZOFRAN) injection 4 mg (4 mg Intravenous Given 11/22/18 1953)  promethazine (PHENERGAN) injection 10 mg (10 mg Intravenous Given 11/22/18 2227)  fentaNYL (SUBLIMAZE) injection 50 mcg (50 mcg Intravenous Given 11/22/18 2233)   CRITICAL CARE Performed by: Julianne Rice Total critical care time: 35 minutes Critical care time was exclusive of separately billable procedures and treating other patients. Critical care was necessary to treat or prevent imminent or life-threatening deterioration. Critical care was time spent personally by me on the following activities: development of treatment plan with patient and/or surrogate as well as nursing, discussions with consultants, evaluation of patient's response to treatment, examination of patient, obtaining history from patient or surrogate, ordering and performing treatments and interventions, ordering and review of laboratory studies, ordering and review of radiographic studies, pulse oximetry and re-evaluation of patient's condition.  Initial Impression / Assessment and Plan / ED Course  I have reviewed the triage vital signs and the nursing notes.  Pertinent labs & imaging results that were available during my care of the patient were reviewed by me and considered in my medical decision making (see chart for details).        Bilateral subdural hematomas on CT.  Unable to contact patient's girlfriend, Willie [name] [phone]).  Will speak with neurosurgery.  Spoke with neurosurgery PA.  Reviewed images with Dr. Vertell Limber.  Recommends transfer to [facility] under the medicine team for stabilization of his alcohol related symptoms.  Will likely need surgical intervention of the left subdural early next week.  Patient is in agreement with plan.  Placed on CIWA protocol. Final  Clinical Impressions(s) / ED Diagnoses   Final diagnoses:  SDH (subdural hematoma) (HCC)  Alcohol withdrawal syndrome without complication [facility])    ED Discharge Orders    None       Julianne Rice, MD 11/22/18 2303

## 2018-11-22 NOTE — BH Assessment (Signed)
Received TTS consult request. Spoke with Willie Liberty, RN who said Pt is being medically admitted.    Willie [name], [facility], [facility], [facility] Triage Specialist [phone]

## 2018-11-22 NOTE — ED Notes (Signed)
[facility] at bedside. Copy of medical records from [facility] in [place] sent with [facility]

## 2018-11-22 NOTE — ED Notes (Signed)
Unsuccessful IV stick- another RN to attempt

## 2018-11-22 NOTE — H&P (Addendum)
History and Physical    Willie [name] TGY:[id number] DOB: 1975/06/01 DOA: 11/22/2018  Referring MD/NP/PA:   PCP: Debbrah Alar, NP   Patient coming from:  The patient is coming from home.  At baseline, pt is independent for most of ADL.        Chief Complaint: Abdominal pain, headache  HPI: ILAI [name] is a 43 y.o. male with medical history significant of hypertension, hyperlipidemia, diabetes mellitus, GERD, depression, anxiety, OSA not on CPAP, alcohol abuse, chronic alcoholic pancreatitis, back pain, who presents with abdominal pain and headache.  Pt states that he fell and injured his head when was in [place] on 11/14/2018. He was seen in ED there, and found have subdural hematoma.  He was recommended to have surgery for evacuation of subdural hematoma, but he left AMA. He reports headache.  No unilateral weakness, numbness or tingling sacrum this been no vision change or hearing loss.  He reports that in the past several days he has been having nausea, vomiting, abdominal pain, no diarrhea.  He has vomited more than 10 times with nonbilious nonbloody vomitus.  His abdominal pain is located in the central abdomen, constant, sharp, 9 out of 8 out of 10 in severity, nonradiating.  No fever or chills.  Denies symptoms of UTI.  ED Course: pt was found to have lipase 16, INR 1.0, PTT 29, negative COVID-19 test, abnormal liver function (ALP 160, AST 152, ALT 70, total bilirubin 1.0), electrolytes renal function okay, temperature 99.1, blood pressure 161/109, tachycardia, oxygen saturation 97% on room air.  Patient is admitted to stepdown C inpatient.  Neurosurgeon was consulted.  CT head showed:   1. Intermediate attenuation LEFT frontoparietal subdural hematoma measuring 14 mm thick, exerting mass effect upon the LEFT hemisphere.  2. Intermediate to low attenuation RIGHT frontal parietal subdural hematoma up to 8 mm thick with mild mass effect upon RIGHT hemisphere.  3. No  intraparenchymal hemorrhage or infarction.   4. 1 mm LEFT-to-RIGHT midline shift.  Review of Systems:   General: no fevers, chills, no body weight gain, has fatigue. HEENT: no blurry vision, hearing changes or sore throat Respiratory: no dyspnea, coughing, wheezing CV: no chest pain, no palpitations GI: has nausea, vomiting, abdominal pain, no diarrhea, constipation GU: no dysuria, burning on urination, increased urinary frequency, hematuria  Ext: no leg edema Neuro: no unilateral weakness, numbness, or tingling, no vision change or hearing loss. Has HA. Skin: no rash, no skin tear. MSK: No muscle spasm, no deformity, no limitation of range of movement in spin Heme: No easy bruising.  Travel history: No recent long distant travel.  Allergy:  Allergies  Allergen Reactions   Invokamet [Canagliflozin-Metformin Hcl] Other (See Comments)    Lactic acidosis   Metformin And Related Other (See Comments)    DRASTIC drop in blood sugar    Past Medical History:  Diagnosis Date   Alcohol abuse    Alcoholic pancreatitis    recurrent   Depression    Diabetes mellitus, type II ([place])    New Onset 03/2010   GERD (gastroesophageal reflux disease)    High cholesterol    History of low back pain    with herniated disc L5 S1 with right lumbar radiculopathy   Hypertension    Sleep apnea    does not wear a CPAP    Past Surgical History:  Procedure Laterality Date   COLONOSCOPY W/ BIOPSIES  09/2010   Dr. Ardis Hughs.  for intermittent rectal bleeding.  Mild  sigmoid to descending diverticulosis.  Mild left colon erythema, benign biopsy, probably "prep effect"   COLONOSCOPY WITH PROPOFOL N/A 07/06/2017   Procedure: COLONOSCOPY WITH PROPOFOL;  Surgeon: Jerene Bears, MD;  Location: [facility];  Service: Gastroenterology;  Laterality: N/A;   LUMBAR MICRODISCECTOMY  ~ 2004   Dr Ellene Route    Social History:  reports that he has quit smoking. His smoking use included cigarettes. He  started smoking about 2 years ago. He has a 0.25 pack-year smoking history. He has never used smokeless tobacco. He reports current alcohol use. He reports previous drug use.  Family History:  Family History  Problem Relation Age of Onset   Diabetes Mother    Lung cancer Brother        twin brother   Pancreatic cancer Paternal Aunt    Colon cancer Neg Hx    Stomach cancer Neg Hx      Prior to Admission medications   Medication Sig Start Date End Date Taking? Authorizing Provider  ALPRAZolam Duanne Moron) 1 MG tablet Take 1 tablet (1 mg total) by mouth 3 (three) times daily as needed for anxiety. 09/15/18   Oswald Hillock, MD  amLODipine (NORVASC) 10 MG tablet Take 1 tablet (10 mg total) by mouth daily. 08/17/18   Debbrah Alar, NP  blood glucose meter kit and supplies KIT Dispense based on patient and insurance preference. Use up to four times daily as directed. (FOR ICD-9 250.00, 250.01). 07/24/18   Debbrah Alar, NP  CIALIS 10 MG tablet TAKE 1 TABLET BY MOUTH DAILY AS NEEDED FOR ERECTILE DYSFUNCTION Patient taking differently: Take 10 mg by mouth daily as needed for erectile dysfunction.  07/09/18   Debbrah Alar, NP  cloNIDine (CATAPRES) 0.1 MG tablet Take 1 tablet (0.1 mg total) by mouth 3 (three) times daily. 08/17/18   Debbrah Alar, NP  Continuous Blood Gluc Sensor (FREESTYLE LIBRE 14 DAY SENSOR) MISC Inject 1 each into the skin every 14 (fourteen) days. 04/27/18   Debbrah Alar, NP  cyclobenzaprine (FLEXERIL) 5 MG tablet Take 1 tablet (5 mg total) by mouth 2 (two) times daily as needed for muscle spasms. 11/24/17   Horton, Barbette Hair, MD  escitalopram (LEXAPRO) 20 MG tablet Take 1 tablet (20 mg total) by mouth daily. 08/17/18   Debbrah Alar, NP  folic acid (FOLVITE) 1 MG tablet Take 1 tablet (1 mg total) by mouth daily. 08/29/17   Debbrah Alar, NP  glucose blood (FREESTYLE LITE) test strip Use as instructed 07/24/18   Debbrah Alar, NP  Insulin  Glargine (LANTUS SOLOSTAR) 100 UNIT/ML Solostar Pen Inject 25 Units into the skin at bedtime. 08/17/18   Debbrah Alar, NP  insulin lispro (HUMALOG KWIKPEN) 100 UNIT/ML KwikPen Inject 0.05-0.15 mLs (5-15 Units total) into the skin 3 (three) times daily. Inject subcutaneously 3 times a day (just before each meal) 09-10-08 units Humalog 5 units with small meal, 10 units with medium sized meal, and 15 units with large meal. 09/15/18   Darrick Meigs, Marge Duncans, MD  Insulin Pen Needle (ULTICARE SHORT PEN NEEDLES) 31G X 8 MM MISC Use once daily to inject insulin. 04/27/18   Debbrah Alar, NP  Lancets (FREESTYLE) lancets 2 (two) times daily 02/27/17   Debbrah Alar, NP  lisinopril (ZESTRIL) 20 MG tablet Take 1 tablet (20 mg total) by mouth daily. 08/17/18   Debbrah Alar, NP  Multiple Vitamins-Minerals (MULTIVITAMIN) tablet Take 1 tablet by mouth daily. 02/12/16   Palumbo, April, MD  ondansetron (ZOFRAN ODT) 4 MG disintegrating tablet  77m ODT q4 hours prn nausea/vomit Patient taking differently: Take 4 mg by mouth every 4 (four) hours as needed for nausea or vomiting.  10/13/17   BMalvin Johns MD  pantoprazole (PROTONIX) 40 MG tablet Take 1 tablet (40 mg total) by mouth 2 (two) times daily. 08/17/18   ODebbrah Alar NP  QUEtiapine (SEROQUEL) 300 MG tablet Take 1 tablet (300 mg total) by mouth at bedtime. 08/17/18   ODebbrah Alar NP  sildenafil (REVATIO) 20 MG tablet Take 1-2 tabs by mouth once daily as needed 30-60 minutes prior to sexual activity. 07/24/18   ODebbrah Alar NP  thiamine 100 MG tablet Take 1 tablet (100 mg total) by mouth daily. 03/20/18   Saguier, EPercell Miller PA-C  zolpidem (AMBIEN) 5 MG tablet Take 1 tablet (5 mg total) by mouth at bedtime as needed. for sleep 08/18/18   ODebbrah Alar NP    Physical Exam: Vitals:   11/22/18 2200 11/22/18 2230 11/22/18 2233 11/22/18 2333  BP: (!) 161/109 (!) 154/103  (!) 153/100  Pulse: 98 100  (!) 102  Resp: 16   18  Temp:    98.4 F (36.9 C) 99.1 F (37.3 C)  TempSrc:   Oral Oral  SpO2: 99% 98%  100%  Weight:    76.2 kg  Height:    6' (1.829 m)   General: Not in acute distress HEENT:       Eyes: PERRL, EOMI, no scleral icterus.       ENT: No discharge from the ears and nose, no pharynx injection, no tonsillar enlargement.        Neck: No JVD, no bruit, no mass felt. Heme: No neck lymph node enlargement. Cardiac: S1/S2, RRR, No murmurs, No gallops or rubs. Respiratory:  No rales, wheezing, rhonchi or rubs. GI: Soft, nondistended, has tenderness in central abdomen, no rebound pain, no organomegaly, BS present. GU: No hematuria Ext: No pitting leg edema bilaterally. 2+DP/PT pulse bilaterally. Musculoskeletal: No joint deformities, No joint redness or warmth, no limitation of ROM in spin. Skin: No rashes.  Neuro: Alert, oriented X3, cranial nerves II-XII grossly intact, moves all extremities normally. Muscle strength 5/5 in all extremities, sensation to light touch intact. Psych: Patient is not psychotic, no suicidal or hemocidal ideation.  Labs on Admission: I have personally reviewed following labs and imaging studies  CBC: Recent Labs  Lab 11/22/18 1730 11/23/18 0045  WBC 4.7 5.2  NEUTROABS 3.7  --   HGB 12.4* 11.8*  HCT 37.4* 34.4*  MCV 90.3 89.1  PLT 433* 3025  Basic Metabolic Panel: Recent Labs  Lab 11/22/18 1730  NA 137  K 4.0  CL 96*  CO2 23  GLUCOSE 233*  BUN <5*  CREATININE 0.87  CALCIUM 8.2*   GFR: Estimated Creatinine Clearance: 119.2 mL/min (by C-G formula based on SCr of 0.87 mg/dL). Liver Function Tests: Recent Labs  Lab 11/22/18 1730  AST 152*  ALT 70*  ALKPHOS 160*  BILITOT 1.1  PROT 6.4*  ALBUMIN 3.0*   Recent Labs  Lab 11/22/18 1730  LIPASE 16   No results for input(s): AMMONIA in the last 168 hours. Coagulation Profile: Recent Labs  Lab 11/22/18 1730  INR 1.0   Cardiac Enzymes: No results for input(s): CKTOTAL, CKMB, CKMBINDEX, TROPONINI in the  last 168 hours. BNP (last 3 results) No results for input(s): PROBNP in the last 8760 hours. HbA1C: Recent Labs    11/23/18 0045  HGBA1C 6.9*   CBG: Recent Labs  Lab 11/22/18 2226  11/23/18 0032  GLUCAP 168* 186*   Lipid Profile: No results for input(s): CHOL, HDL, LDLCALC, TRIG, CHOLHDL, LDLDIRECT in the last 72 hours. Thyroid Function Tests: No results for input(s): TSH, T4TOTAL, FREET4, T3FREE, THYROIDAB in the last 72 hours. Anemia Panel: No results for input(s): VITAMINB12, FOLATE, FERRITIN, TIBC, IRON, RETICCTPCT in the last 72 hours. Urine analysis:    Component Value Date/Time   COLORURINE YELLOW 09/11/2018 1750   APPEARANCEUR CLEAR 09/11/2018 1750   LABSPEC 1.025 09/11/2018 1750   PHURINE 5.5 09/11/2018 1750   GLUCOSEU >=500 (A) 09/11/2018 1750   HGBUR NEGATIVE 09/11/2018 1750   BILIRUBINUR NEGATIVE 09/11/2018 1750   KETONESUR NEGATIVE 09/11/2018 1750   PROTEINUR NEGATIVE 09/11/2018 1750   UROBILINOGEN 0.2 11/17/2014 1315   NITRITE NEGATIVE 09/11/2018 1750   LEUKOCYTESUR NEGATIVE 09/11/2018 1750   Sepsis Labs: '@LABRCNTIP' (procalcitonin:4,lacticidven:4) ) Recent Results (from the past 240 hour(s))  SARS Coronavirus 2 (Performed in [facility] hospital lab)     Status: None   Collection Time: 11/22/18  5:35 PM   Specimen: Nasopharyngeal Swab  Result Value Ref Range Status   SARS Coronavirus 2 NEGATIVE NEGATIVE Final    Comment: (NOTE) If result is NEGATIVE SARS-CoV-2 target nucleic acids are NOT DETECTED. The SARS-CoV-2 RNA is generally detectable in upper and lower  respiratory specimens during the acute phase of infection. The lowest  concentration of SARS-CoV-2 viral copies this assay can detect is 250  copies / mL. A negative result does not preclude SARS-CoV-2 infection  and should not be used as the sole basis for treatment or other  patient management decisions.  A negative result may occur with  improper specimen collection / handling, submission  of specimen other  than nasopharyngeal swab, presence of viral mutation(s) within the  areas targeted by this assay, and inadequate number of viral copies  (<250 copies / mL). A negative result must be combined with clinical  observations, patient history, and epidemiological information. If result is POSITIVE SARS-CoV-2 target nucleic acids are DETECTED. The SARS-CoV-2 RNA is generally detectable in upper and lower  respiratory specimens dur ing the acute phase of infection.  Positive  results are indicative of active infection with SARS-CoV-2.  Clinical  correlation with patient history and other diagnostic information is  necessary to determine patient infection status.  Positive results do  not rule out bacterial infection or co-infection with other viruses. If result is PRESUMPTIVE POSTIVE SARS-CoV-2 nucleic acids MAY BE PRESENT.   A presumptive positive result was obtained on the submitted specimen  and confirmed on repeat testing.  While 2019 novel coronavirus  (SARS-CoV-2) nucleic acids may be present in the submitted sample  additional confirmatory testing may be necessary for epidemiological  and / or clinical management purposes  to differentiate between  SARS-CoV-2 and other Sarbecovirus currently known to infect humans.  If clinically indicated additional testing with an alternate test  methodology [phone]) is advised. The SARS-CoV-2 RNA is generally  detectable in upper and lower respiratory sp ecimens during the acute  phase of infection. The expected result is Negative. Fact Sheet for Patients:  StrictlyIdeas.no Fact Sheet for Healthcare Providers: BankingDealers.co.za This test is not yet approved or cleared by the [place] FDA and has been authorized for detection and/or diagnosis of SARS-CoV-2 by FDA under an Emergency Use Authorization (EUA).  This EUA will remain in effect (meaning this test can be used) for  the duration of the COVID-19 declaration under Section 564(b)(1) of the Act, 21 U.S.C. section 360bbb-3(b)(1),  unless the authorization is terminated or revoked sooner. Performed at [facility], [address] [place], [place] [address]      Radiological Exams on Admission: Ct Head Wo Contrast  Result Date: 11/22/2018 CLINICAL DATA:  Posttraumatic headache, nausea and vomiting, LEFT side head pain; just got back from [facility], [place], went to hospital and was told he we had a blood clot in his brain, unclear about when he actually fell and when he arrived home EXAM: CT HEAD WITHOUT CONTRAST TECHNIQUE: Contiguous axial images were obtained from the base of the skull through the vertex without intravenous contrast. Sagittal and coronal MPR images reconstructed from axial data set. COMPARISON:  06/07/2017 FINDINGS: Brain: Normal ventricular morphology. Intermediate attenuation LEFT frontoparietal subdural hematoma measuring up to 14 mm thick. Mass effect upon the lateral aspect of the RIGHT hemisphere. Additionally, older appearing intermediate to low attenuation RIGHT frontoparietal subdural hematoma measuring up to 8 mm thick with mild mass effect upon the RIGHT hemisphere. No intraparenchymal hemorrhage, evidence acute infarct, or mass. 1 mm of LEFT-to-RIGHT midline shift. Vascular: Unremarkable Skull: Intact Sinuses/Orbits: Clear Other: N/A IMPRESSION: Intermediate attenuation LEFT frontoparietal subdural hematoma measuring 14 mm thick, exerting mass effect upon the LEFT hemisphere. Intermediate to low attenuation RIGHT frontal parietal subdural hematoma up to 8 mm thick with mild mass effect upon RIGHT hemisphere. No intraparenchymal hemorrhage or infarction. 1 mm LEFT-to-RIGHT midline shift. Critical Value/emergent results were called by telephone at the time of interpretation on 11/22/2018 at 5:31 pm to Dr. Julianne Rice , who verbally acknowledged these results.  Electronically Signed   By: Lavonia Dana M.D.   On: 11/22/2018 17:31     EKG: Independently reviewed.  Sinus rhythm, QTC 509, occasional PVC, nonspecific T wave change.   Assessment/Plan Principal Problem:   SDH (subdural hematoma) (HCC) Active Problems:   Diabetes type 2, uncontrolled (HCC)   Essential hypertension   Depression with anxiety   Chronic pancreatitis (HCC)   Alcohol abuse   GERD (gastroesophageal reflux disease)   Abdominal pain   Abnormal LFTs   Addendum: RN reports his Bp is 82/60 at 4:04 AM, recheck Bp 90/75. -will hold all blood pressure medications -Give 1 L normal saline bolus, -Increase IV fluid rate to 125 cc/h   SDH (subdural hematoma) ([place]): CT-head showed bilateral subdural hematoma (left frontoparietal and right frontoparietal) with 1 mm left to right midline shift. Pt has HA, but no focal neurologic findings on physical examination.  Neurosurgeon was consulted, per PA, Costella's note, "No need for emergent NS intervention, but will likely need b/l crani for evacuation. Would appreciate TH admission for management of w/d and for tune up prior to any surgical intervention.  Would aim for Tuesday if okay medically. Rec q 2 hour neuro checks". RN, Chrissie Noa will inform neurosurgeon of patient's arrival.  -will admit to SDU as inpt -Frequent neuro checks every 2 hour -Follow-up neurosurgeon's further recommendations.  Abdominal pain and hx of chronic pancreatitis: Patient's abdominal pain is most likely due to chronic pancreatitis secondary to alcohol abuse.  Lipase is 16.  No acute abdomen on physical examination.  Patient may also have alcoholic gastritis. - Supportive care - Protonix to 40 mg twice daily - N.p.o. - IV fluid: 1 L normal saline, then 75 cc/h - PRN oxycodone and morphine for pain - As needed hydroxyzine for nausea vomiting (patient QTC prolongation cannot use Zofran)  Abnormal LFTs: Patient has abnormal liver function with AST 152/ALT 70,  the ratio is atypical for alcohol abuse.  -will check HIV antibody, hepatitis panel - Avoid using liver toxic medications, such as Tylenol  Diabetes type 2, uncontrolled ([place]): Last A1c not on record. Patient is taking Humalog and Lantus at home -will decrease Lantus dose from 20 into 14 unit daily -SSI  HTN:  -Continue home medications: Amlodipine, clonidine, lisinopril -IV hydralazine prn  Depression with anxiety: -Lexapro, Seroquel  Alcohol abuse: -CIWA protocol -Need counseling about importance of quitting alcohol use  GERD (gastroesophageal reflux disease): -On Protonix      Inpatient status:  # Patient requires inpatient status due to high intensity of service, high risk for further deterioration and high frequency of surveillance required.  I certify that at the point of admission it is my clinical judgment that the patient will require inpatient hospital care spanning beyond 2 midnights from the point of admission.   This patient has multiple chronic comorbidities including  hypertension, hyperlipidemia, diabetes mellitus, GERD, depression, anxiety, OSA not on CPAP, alcohol abuse, chronic alcoholic pancreatitis, back pain  Now patient has presenting with bilateral subdural hematoma with midline shift, abdominal pain which is likely due to chronic alcoholic pancreatitis and possible alcoholic gastritis  The worrisome physical exam findings include abdominal tenderness in central abdomen  The initial radiographic and laboratory data are worrisome because of bilateral subdural hematoma with midline shift on CT scan, abnormal liver function  Current medical needs: please see my assessment and plan  Predictability of an adverse outcome (risk): Patient is has multiple comorbidities, now presents with bilateral subdural hematoma with midline shift.  Patient will need surgery on Tuesday.  He also has abdominal pain which is likely due to chronic alcoholic pancreatitis and  possible alcoholic gastritis.  His major issue is bilateral subdural hematoma with midline shift.  Patient's presentation is highly complicated, at high risk for deteriorating.  Will need to be treated in the hospital for at least 2 days.   DVT ppx: SCD Code Status: Full code Family Communication: None at bed side.    Disposition Plan:  Anticipate discharge back to previous home environment Consults called: Neurosurgeon Admission status:   SDU/inpation       Date of Service 11/23/2018    [facility]   If 7PM-7AM, please contact night-coverage [email or web address] Password [email or web address] 11/23/2018, 1:13 AM

## 2018-11-23 ENCOUNTER — Inpatient Hospital Stay (HOSPITAL_COMMUNITY): Payer: Self-pay

## 2018-11-23 ENCOUNTER — Other Ambulatory Visit: Payer: Self-pay | Admitting: Neurosurgery

## 2018-11-23 DIAGNOSIS — R55 Syncope and collapse: Secondary | ICD-10-CM

## 2018-11-23 DIAGNOSIS — F418 Other specified anxiety disorders: Secondary | ICD-10-CM

## 2018-11-23 LAB — HIV ANTIBODY (ROUTINE TESTING W REFLEX): HIV Screen 4th Generation wRfx: NONREACTIVE

## 2018-11-23 LAB — CBC
HCT: 34.4 % — ABNORMAL LOW (ref 39.0–52.0)
Hemoglobin: 11.8 g/dL — ABNORMAL LOW (ref 13.0–17.0)
MCH: 30.6 pg (ref 26.0–34.0)
MCHC: 34.3 g/dL (ref 30.0–36.0)
MCV: 89.1 fL (ref 80.0–100.0)
Platelets: 387 10*3/uL (ref 150–400)
RBC: 3.86 MIL/uL — ABNORMAL LOW (ref 4.22–5.81)
RDW: 14.6 % (ref 11.5–15.5)
WBC: 5.2 10*3/uL (ref 4.0–10.5)
nRBC: 0 % (ref 0.0–0.2)

## 2018-11-23 LAB — HEMOGLOBIN A1C
Hgb A1c MFr Bld: 6.9 % — ABNORMAL HIGH (ref 4.8–5.6)
Mean Plasma Glucose: 151.33 mg/dL

## 2018-11-23 LAB — GLUCOSE, CAPILLARY
Glucose-Capillary: 110 mg/dL — ABNORMAL HIGH (ref 70–99)
Glucose-Capillary: 186 mg/dL — ABNORMAL HIGH (ref 70–99)
Glucose-Capillary: 201 mg/dL — ABNORMAL HIGH (ref 70–99)
Glucose-Capillary: 282 mg/dL — ABNORMAL HIGH (ref 70–99)
Glucose-Capillary: 51 mg/dL — ABNORMAL LOW (ref 70–99)
Glucose-Capillary: 52 mg/dL — ABNORMAL LOW (ref 70–99)
Glucose-Capillary: 81 mg/dL (ref 70–99)
Glucose-Capillary: 91 mg/dL (ref 70–99)

## 2018-11-23 LAB — BASIC METABOLIC PANEL
Anion gap: 12 (ref 5–15)
BUN: <5 mg/dL — ABNORMAL LOW (ref 6–20)
CO2: 26 mmol/L (ref 22–32)
Calcium: 8.2 mg/dL — ABNORMAL LOW (ref 8.9–10.3)
Chloride: 98 mmol/L (ref 98–111)
Creatinine, Ser: 0.86 mg/dL (ref 0.61–1.24)
GFR calc Af Amer: >60 mL/min (ref >60)
GFR calc non Af Amer: >60 mL/min (ref >60)
Glucose, Bld: 185 mg/dL — ABNORMAL HIGH (ref 70–99)
Potassium: 3.5 mmol/L (ref 3.5–5.1)
Sodium: 136 mmol/L (ref 135–145)

## 2018-11-23 LAB — ECHOCARDIOGRAM COMPLETE
Height: 72 in
Weight: 2687.85 oz

## 2018-11-23 MED ORDER — OXYCODONE HCL 5 MG PO TABS
5.0000 mg | ORAL_TABLET | Freq: Four times a day (QID) | ORAL | Status: DC | PRN
  Administered 2018-11-23 – 2018-11-28 (×10): 5 mg via ORAL
  Filled 2018-11-23 (×10): qty 1

## 2018-11-23 MED ORDER — DEXTROSE 50 % IV SOLN
INTRAVENOUS | Status: AC
  Administered 2018-11-23: 50 mL
  Filled 2018-11-23: qty 50

## 2018-11-23 MED ORDER — HYDRALAZINE HCL 20 MG/ML IJ SOLN
5.0000 mg | INTRAMUSCULAR | Status: DC | PRN
  Administered 2018-11-24: 5 mg via INTRAVENOUS
  Filled 2018-11-23: qty 1

## 2018-11-23 MED ORDER — INSULIN ASPART 100 UNIT/ML ~~LOC~~ SOLN
0.0000 [IU] | Freq: Three times a day (TID) | SUBCUTANEOUS | Status: DC
  Administered 2018-11-23: 3 [IU] via SUBCUTANEOUS
  Administered 2018-11-23: 18:00:00 0 [IU] via SUBCUTANEOUS

## 2018-11-23 MED ORDER — MORPHINE SULFATE (PF) 2 MG/ML IV SOLN
2.0000 mg | INTRAVENOUS | Status: DC | PRN
  Administered 2018-11-23 – 2018-11-24 (×5): 2 mg via INTRAVENOUS
  Filled 2018-11-23 (×6): qty 1

## 2018-11-23 MED ORDER — SODIUM CHLORIDE 0.9 % IV SOLN
INTRAVENOUS | Status: DC
  Administered 2018-11-23 (×3): via INTRAVENOUS

## 2018-11-23 MED ORDER — HYDROXYZINE HCL 10 MG PO TABS
10.0000 mg | ORAL_TABLET | Freq: Three times a day (TID) | ORAL | Status: DC | PRN
  Administered 2018-11-25: 10 mg via ORAL
  Filled 2018-11-23 (×3): qty 1

## 2018-11-23 MED ORDER — HYDROXYZINE HCL 50 MG/ML IM SOLN
25.0000 mg | Freq: Four times a day (QID) | INTRAMUSCULAR | Status: DC | PRN
  Administered 2018-11-24: 25 mg via INTRAMUSCULAR
  Filled 2018-11-23 (×3): qty 0.5

## 2018-11-23 MED ORDER — SODIUM CHLORIDE 0.9 % IV BOLUS
1000.0000 mL | Freq: Once | INTRAVENOUS | Status: AC
  Administered 2018-11-23: 04:00:00 1000 mL via INTRAVENOUS

## 2018-11-23 NOTE — Consult Note (Addendum)
Chief Complaint   Chief Complaint  Patient presents with   Head Injury    HPI   Consult requested by: Dr Lita Mains Reason for consult: Bilateral SDH  HPI: Willie [name] is a 43 y.o. male with history HTN, DM, hyperlipidemia, alcohol abuse, chronic alcoholic pancreatis who presented to [facility] yesterday due to worsening nausea with vomiting and headache.  History begins July 18th when he was in [place].  He was noted to be combative and altered so he was taken to emergency room where he was diagnosed with left > right bilateral subdural hematomas, 69m and 173m Did note striking head on washer several days prior.  It was recommended that he undergo craniotomy for evacuation of the left subdural hematoma however patient left against medical advice.  Symptoms have been gradually worsening.   He underwent a head CT yesterday which revealed possible slight worsening in bilateral subdural hematomas .   NSY consultation requested.    He currently complains of mild occipital headache, nausea with vomiting and epigastric cramping. He denies changes in vision,  Dizziness, N/T, weakness or focal deficit. He was given amAzerbaijannd Seroquel last night for sleep.   [facility] [place] Patient Active Problem List   Diagnosis Date Noted   Abdominal pain 11/22/2018   SDH (subdural hematoma) (HCAlpaugh07/26/2020   Abnormal LFTs 11/22/2018   IDDM (insulin dependent diabetes mellitus) (HCCrescent Beach05/16/2020   Acute hepatitis 09/11/2018   Alcohol-induced mood disorder (HCVillisca   DKA (diabetic ketoacidoses) (HCFarm[place]04/04/2017   Alcohol withdrawal (HCSavonburg04/04/2017   Abnormal CT scan, colon    Benign neoplasm of descending colon    Lower GI bleed 07/04/2017   Transaminitis 07/04/2017   Pain    Bleeding internal hemorrhoids    Anemia of chronic disease    Left arm weakness    Anemia, blood loss    Vomiting alone  03/27/2017   Chest pain 03/27/2017   Nausea and vomiting    Lactic acidosis 03/26/2017   History of alcohol abuse 06/25/2016   Tobacco abuse 06/25/2016   Anxiety state 11/20/2015   Dental infection 07/10/2015   Mood disorder (HCLead[facility] North12/20/2016   Eczema 06/27/2014   GERD (gastroesophageal reflux disease) 05/24/2014   Snoring 04/07/2013   Suicide attempt by substance overdose (HCWyocena10/15/2014   Alcohol abuse 12/31/2012   Erectile dysfunction 12/09/2012   Hematuria 06/11/2012   Vision problem 02/02/2012   Insomnia 12/20/2011   Chronic pancreatitis (HCBrookdale10/12/2010   Depression with anxiety 11/13/2010   Hyperlipemia 06/27/2010   Diabetes type 2, uncontrolled (HCGrants Pass01/24/2012   Essential hypertension 05/22/2010    PMH: Past Medical History:  Diagnosis Date   Alcohol abuse    Alcoholic pancreatitis    recurrent   Depression    Diabetes mellitus, type II (HCVirgil   [place] 03/2010   GERD (gastroesophageal reflux disease)    High cholesterol    History of low back pain    with herniated disc L5 S1 with right lumbar radiculopathy   Hypertension    Sleep apnea    does not wear a CPAP    PSH: Past Surgical History:  Procedure Laterality Date   COLONOSCOPY W/ BIOPSIES  09/2010   Dr. JaArdis Hughs for intermittent rectal bleeding.  Mild sigmoid to descending diverticulosis.  Mild left colon erythema, benign biopsy, probably "prep effect"   COLONOSCOPY WITH PROPOFOL N/A 07/06/2017  Procedure: COLONOSCOPY WITH PROPOFOL;  Surgeon: Jerene Bears, MD;  Location: [facility];  Service: Gastroenterology;  Laterality: N/A;   LUMBAR MICRODISCECTOMY  ~ 2004   Dr Ellene Route    Medications Prior to Admission  Medication Sig Dispense Refill Last Dose   ALPRAZolam (XANAX) 1 MG tablet Take 1 tablet (1 mg total) by mouth 3 (three) times daily as needed for anxiety. 15 tablet 0    amLODipine (NORVASC) 10 MG tablet Take 1 tablet (10 mg total) by mouth daily. 90  tablet 1    blood glucose meter kit and supplies KIT Dispense based on patient and insurance preference. Use up to four times daily as directed. (FOR ICD-9 250.00, 250.01). 1 each 0    CIALIS 10 MG tablet TAKE 1 TABLET BY MOUTH DAILY AS NEEDED FOR ERECTILE DYSFUNCTION (Patient taking differently: Take 10 mg by mouth daily as needed for erectile dysfunction. ) 10 tablet 5    cloNIDine (CATAPRES) 0.1 MG tablet Take 1 tablet (0.1 mg total) by mouth 3 (three) times daily. 270 tablet 1    Continuous Blood Gluc Sensor (FREESTYLE LIBRE 14 DAY SENSOR) MISC Inject 1 each into the skin every 14 (fourteen) days. 6 each 3    cyclobenzaprine (FLEXERIL) 5 MG tablet Take 1 tablet (5 mg total) by mouth 2 (two) times daily as needed for muscle spasms. 10 tablet 0    escitalopram (LEXAPRO) 20 MG tablet Take 1 tablet (20 mg total) by mouth daily. 90 tablet 0    folic acid (FOLVITE) 1 MG tablet Take 1 tablet (1 mg total) by mouth daily. 30 tablet 5    glucose blood (FREESTYLE LITE) test strip Use as instructed 100 each 12    Insulin Glargine (LANTUS SOLOSTAR) 100 UNIT/ML Solostar Pen Inject 25 Units into the skin at bedtime. 45 mL 0    insulin lispro (HUMALOG KWIKPEN) 100 UNIT/ML KwikPen Inject 0.05-0.15 mLs (5-15 Units total) into the skin 3 (three) times daily. Inject subcutaneously 3 times a day (just before each meal) 09-10-08 units Humalog 5 units with small meal, 10 units with medium sized meal, and 15 units with large meal. 10 mL 2    Insulin Pen Needle (ULTICARE SHORT PEN NEEDLES) 31G X 8 MM MISC Use once daily to inject insulin. 100 each 1    Lancets (FREESTYLE) lancets 2 (two) times daily 200 each 5    lisinopril (ZESTRIL) 20 MG tablet Take 1 tablet (20 mg total) by mouth daily. 90 tablet 1    Multiple Vitamins-Minerals (MULTIVITAMIN) tablet Take 1 tablet by mouth daily. 30 tablet 0    ondansetron (ZOFRAN ODT) 4 MG disintegrating tablet 11m ODT q4 hours prn nausea/vomit (Patient taking  differently: Take 4 mg by mouth every 4 (four) hours as needed for nausea or vomiting. ) 4 tablet 0    pantoprazole (PROTONIX) 40 MG tablet Take 1 tablet (40 mg total) by mouth 2 (two) times daily. 60 tablet 5    QUEtiapine (SEROQUEL) 300 MG tablet Take 1 tablet (300 mg total) by mouth at bedtime. 90 tablet 0    sildenafil (REVATIO) 20 MG tablet Take 1-2 tabs by mouth once daily as needed 30-60 minutes prior to sexual activity. 50 tablet 0    thiamine 100 MG tablet Take 1 tablet (100 mg total) by mouth daily. 90 tablet 1    zolpidem (AMBIEN) 5 MG tablet Take 1 tablet (5 mg total) by mouth at bedtime as needed. for sleep 30 tablet 2  SH: Social History   Tobacco Use   Smoking status: Former Smoker    Packs/day: 0.25    Years: 1.00    Pack years: 0.25    Types: Cigarettes    Start date: 04/29/2016   Smokeless tobacco: Never Used   Tobacco comment: 4 Cigarettes a day   Substance Use Topics   Alcohol use: Yes    Comment: 12 pack beer a day   Drug use: Not Currently    Comment: none in years    MEDS: Prior to Admission medications   Medication Sig Start Date End Date Taking? Authorizing Provider  ALPRAZolam Duanne Moron) 1 MG tablet Take 1 tablet (1 mg total) by mouth 3 (three) times daily as needed for anxiety. 09/15/18   Oswald Hillock, MD  amLODipine (NORVASC) 10 MG tablet Take 1 tablet (10 mg total) by mouth daily. 08/17/18   Debbrah Alar, NP  blood glucose meter kit and supplies KIT Dispense based on patient and insurance preference. Use up to four times daily as directed. (FOR ICD-9 250.00, 250.01). 07/24/18   Debbrah Alar, NP  CIALIS 10 MG tablet TAKE 1 TABLET BY MOUTH DAILY AS NEEDED FOR ERECTILE DYSFUNCTION Patient taking differently: Take 10 mg by mouth daily as needed for erectile dysfunction.  07/09/18   Debbrah Alar, NP  cloNIDine (CATAPRES) 0.1 MG tablet Take 1 tablet (0.1 mg total) by mouth 3 (three) times daily. 08/17/18   Debbrah Alar, NP    Continuous Blood Gluc Sensor (FREESTYLE LIBRE 14 DAY SENSOR) MISC Inject 1 each into the skin every 14 (fourteen) days. 04/27/18   Debbrah Alar, NP  cyclobenzaprine (FLEXERIL) 5 MG tablet Take 1 tablet (5 mg total) by mouth 2 (two) times daily as needed for muscle spasms. 11/24/17   Horton, Barbette Hair, MD  escitalopram (LEXAPRO) 20 MG tablet Take 1 tablet (20 mg total) by mouth daily. 08/17/18   Debbrah Alar, NP  folic acid (FOLVITE) 1 MG tablet Take 1 tablet (1 mg total) by mouth daily. 08/29/17   Debbrah Alar, NP  glucose blood (FREESTYLE LITE) test strip Use as instructed 07/24/18   Debbrah Alar, NP  Insulin Glargine (LANTUS SOLOSTAR) 100 UNIT/ML Solostar Pen Inject 25 Units into the skin at bedtime. 08/17/18   Debbrah Alar, NP  insulin lispro (HUMALOG KWIKPEN) 100 UNIT/ML KwikPen Inject 0.05-0.15 mLs (5-15 Units total) into the skin 3 (three) times daily. Inject subcutaneously 3 times a day (just before each meal) 09-10-08 units Humalog 5 units with small meal, 10 units with medium sized meal, and 15 units with large meal. 09/15/18   Darrick Meigs, Marge Duncans, MD  Insulin Pen Needle (ULTICARE SHORT PEN NEEDLES) 31G X 8 MM MISC Use once daily to inject insulin. 04/27/18   Debbrah Alar, NP  Lancets (FREESTYLE) lancets 2 (two) times daily 02/27/17   Debbrah Alar, NP  lisinopril (ZESTRIL) 20 MG tablet Take 1 tablet (20 mg total) by mouth daily. 08/17/18   Debbrah Alar, NP  Multiple Vitamins-Minerals (MULTIVITAMIN) tablet Take 1 tablet by mouth daily. 02/12/16   Palumbo, April, MD  ondansetron (ZOFRAN ODT) 4 MG disintegrating tablet 52m ODT q4 hours prn nausea/vomit Patient taking differently: Take 4 mg by mouth every 4 (four) hours as needed for nausea or vomiting.  10/13/17   BMalvin Johns MD  pantoprazole (PROTONIX) 40 MG tablet Take 1 tablet (40 mg total) by mouth 2 (two) times daily. 08/17/18   ODebbrah Alar NP  QUEtiapine (SEROQUEL) 300 MG tablet Take 1  tablet (300 mg  total) by mouth at bedtime. 08/17/18   Debbrah Alar, NP  sildenafil (REVATIO) 20 MG tablet Take 1-2 tabs by mouth once daily as needed 30-60 minutes prior to sexual activity. 07/24/18   Debbrah Alar, NP  thiamine 100 MG tablet Take 1 tablet (100 mg total) by mouth daily. 03/20/18   Saguier, Percell Miller, PA-C  zolpidem (AMBIEN) 5 MG tablet Take 1 tablet (5 mg total) by mouth at bedtime as needed. for sleep 08/18/18   Debbrah Alar, NP    ALLERGY: Allergies  Allergen Reactions   Invokamet [Canagliflozin-Metformin Hcl] Other (See Comments)    Lactic acidosis   Metformin And Related Other (See Comments)    DRASTIC drop in blood sugar    Social History   Tobacco Use   Smoking status: Former Smoker    Packs/day: 0.25    Years: 1.00    Pack years: 0.25    Types: Cigarettes    Start date: 04/29/2016   Smokeless tobacco: Never Used   Tobacco comment: 4 Cigarettes a day   Substance Use Topics   Alcohol use: Yes    Comment: 12 pack beer a day     Family History  Problem Relation Age of Onset   Diabetes Mother    Lung cancer Brother        twin brother   Pancreatic cancer Paternal Aunt    Colon cancer Neg Hx    Stomach cancer Neg Hx      ROS   Review of Systems  Constitutional: Negative.   HENT: Negative.   Eyes: Negative.  Negative for blurred vision, double vision and photophobia.  Respiratory: Negative.   Cardiovascular: Negative.   Gastrointestinal: Positive for nausea and vomiting.  Genitourinary: Negative.   Musculoskeletal: Negative.   Skin: Negative.   Neurological: Positive for headaches. Negative for dizziness, tingling, tremors, sensory change, speech change, focal weakness, seizures and weakness.    Exam   Vitals:   11/23/18 0422 11/23/18 0527  BP:  96/66  Pulse: 99 88  Resp:    Temp:    SpO2:     General appearance: WDWN, NAD, drowsy but easily awakened Eyes: No scleral injection Cardiovascular: Regular rate and  rhythm without murmurs, rubs, gallops. No edema or variciosities. Distal pulses normal. Pulmonary: Effort normal, non-labored breathing Musculoskeletal:     Muscle tone upper extremities: Normal    Muscle tone lower extremities: Normal    Motor exam: Upper Extremities Deltoid Bicep Tricep Grip  Right 5/5 5/5 5/5 5/5  Left 5/5 5/5 5/5 5/5   Lower Extremity IP Quad PF DF EHL  Right 5/5 5/5 5/5 5/5 5/5  Left 5/5 5/5 5/5 5/5 5/5   Neurological Mental Status:    - Patient is awake, alert, oriented to person, place, month, year, and situation    - Patient is able to give a clear and coherent history.    - No signs of aphasia or neglect Cranial Nerves    - II: Visual Fields are full. PERRL    - III/IV/VI: EOMI without ptosis or diploplia. Mild left nystagmus    - V: Facial sensation is grossly normal    - VII: Facial movement is symmetric.     - VIII: hearing is intact to voice    - X: Uvula elevates symmetrically    - XI: Shoulder shrug is symmetric.    - XII: tongue is midline without atrophy or fasciculations.  Sensory: Sensation grossly intact to LT Plantars   - Toes are downgoing  bilaterally. Cerebellar    - FNF with slight incoordination, L>R Mild left drift   Results - Imaging/Labs   Results for orders placed or performed during the hospital encounter of 11/22/18 (from the past 48 hour(s))  CBC with Differential/Platelet     Status: Abnormal   Collection Time: 11/22/18  5:30 PM  Result Value Ref Range   WBC 4.7 4.0 - 10.5 K/uL   RBC 4.14 (L) 4.22 - 5.81 MIL/uL   Hemoglobin 12.4 (L) 13.0 - 17.0 g/dL   HCT 37.4 (L) 39.0 - 52.0 %   MCV 90.3 80.0 - 100.0 fL   MCH 30.0 26.0 - 34.0 pg   MCHC 33.2 30.0 - 36.0 g/dL   RDW 14.8 11.5 - 15.5 %   Platelets 433 (H) 150 - 400 K/uL   nRBC 0.0 0.0 - 0.2 %   Neutrophils Relative % 80 %   Neutro Abs 3.7 1.7 - 7.7 K/uL   Lymphocytes Relative 9 %   Lymphs Abs 0.4 (L) 0.7 - 4.0 K/uL   Monocytes Relative 8 %   Monocytes Absolute 0.4  0.1 - 1.0 K/uL   Eosinophils Relative 2 %   Eosinophils Absolute 0.1 0.0 - 0.5 K/uL   Basophils Relative 1 %   Basophils Absolute 0.1 0.0 - 0.1 K/uL   Immature Granulocytes 0 %   Abs Immature Granulocytes 0.02 0.00 - 0.07 K/uL    Comment: Performed at [facility], [address] [place], [place] [address]  Comprehensive metabolic panel     Status: Abnormal   Collection Time: 11/22/18  5:30 PM  Result Value Ref Range   Sodium 137 135 - 145 mmol/L   Potassium 4.0 3.5 - 5.1 mmol/L   Chloride 96 (L) 98 - 111 mmol/L   CO2 23 22 - 32 mmol/L   Glucose, Bld 233 (H) 70 - 99 mg/dL   BUN <5 (L) 6 - 20 mg/dL   Creatinine, Ser 0.87 0.61 - 1.24 mg/dL   Calcium 8.2 (L) 8.9 - 10.3 mg/dL   Total Protein 6.4 (L) 6.5 - 8.1 g/dL   Albumin 3.0 (L) 3.5 - 5.0 g/dL   AST 152 (H) 15 - 41 U/L   ALT 70 (H) 0 - 44 U/L   Alkaline Phosphatase 160 (H) 38 - 126 U/L   Total Bilirubin 1.1 0.3 - 1.2 mg/dL   GFR calc non Af Amer >60 >60 mL/min   GFR calc Af Amer >60 >60 mL/min   Anion gap 18 (H) 5 - 15    Comment: Performed at [facility], [address] [place], [place] [address]  Lipase, blood     Status: None   Collection Time: 11/22/18  5:30 PM  Result Value Ref Range   Lipase 16 11 - 51 U/L    Comment: Performed at [facility], [address] [place], [place] [address]  Protime-INR     Status: None   Collection Time: 11/22/18  5:30 PM  Result Value Ref Range   Prothrombin Time 13.1 11.4 - 15.2 seconds   INR 1.0 0.8 - 1.2    Comment: (NOTE) INR goal varies based on device and disease states. Performed at [facility], [address] [place], [place] [address]   APTT     Status: None   Collection Time: 11/22/18  5:30 PM  Result Value Ref Range   aPTT 27 24 - 36 seconds    Comment: Performed  at [facility], [address] [place], [place] [address]  SARS Coronavirus 2 (Performed in [facility] hospital lab)     Status: None   Collection  Time: 11/22/18  5:35 PM   Specimen: Nasopharyngeal Swab  Result Value Ref Range   SARS Coronavirus 2 NEGATIVE NEGATIVE    Comment: (NOTE) If result is NEGATIVE SARS-CoV-2 target nucleic acids are NOT DETECTED. The SARS-CoV-2 RNA is generally detectable in upper and lower  respiratory specimens during the acute phase of infection. The lowest  concentration of SARS-CoV-2 viral copies this assay can detect is 250  copies / mL. A negative result does not preclude SARS-CoV-2 infection  and should not be used as the sole basis for treatment or other  patient management decisions.  A negative result may occur with  improper specimen collection / handling, submission of specimen other  than nasopharyngeal swab, presence of viral mutation(s) within the  areas targeted by this assay, and inadequate number of viral copies  (<250 copies / mL). A negative result must be combined with clinical  observations, patient history, and epidemiological information. If result is POSITIVE SARS-CoV-2 target nucleic acids are DETECTED. The SARS-CoV-2 RNA is generally detectable in upper and lower  respiratory specimens dur ing the acute phase of infection.  Positive  results are indicative of active infection with SARS-CoV-2.  Clinical  correlation with patient history and other diagnostic information is  necessary to determine patient infection status.  Positive results do  not rule out bacterial infection or co-infection with other viruses. If result is PRESUMPTIVE POSTIVE SARS-CoV-2 nucleic acids MAY BE PRESENT.   A presumptive positive result was obtained on the submitted specimen  and confirmed on repeat testing.  While 2019 novel coronavirus  (SARS-CoV-2) nucleic acids may be present in the submitted sample  additional confirmatory testing may be necessary for epidemiological  and / or clinical management purposes  to differentiate between  SARS-CoV-2 and other Sarbecovirus currently known to infect  humans.  If clinically indicated additional testing with an alternate test  methodology [phone]) is advised. The SARS-CoV-2 RNA is generally  detectable in upper and lower respiratory sp ecimens during the acute  phase of infection. The expected result is Negative. Fact Sheet for Patients:  StrictlyIdeas.no Fact Sheet for Healthcare Providers: BankingDealers.co.za This test is not yet approved or cleared by the [place] FDA and has been authorized for detection and/or diagnosis of SARS-CoV-2 by FDA under an Emergency Use Authorization (EUA).  This EUA will remain in effect (meaning this test can be used) for the duration of the COVID-19 declaration under Section 564(b)(1) of the Act, 21 U.S.C. section 360bbb-3(b)(1), unless the authorization is terminated or revoked sooner. Performed at [facility], [address] [place], [place] [address]   POC CBG, ED     Status: Abnormal   Collection Time: 11/22/18 10:26 PM  Result Value Ref Range   Glucose-Capillary 168 (H) 70 - 99 mg/dL  Glucose, capillary     Status: Abnormal   Collection Time: 11/23/18 12:32 AM  Result Value Ref Range   Glucose-Capillary 186 (H) 70 - 99 mg/dL  Hemoglobin A1c     Status: Abnormal   Collection Time: 11/23/18 12:45 AM  Result Value Ref Range   Hgb A1c MFr Bld 6.9 (H) 4.8 - 5.6 %    Comment: (NOTE) Pre diabetes:          5.7%-6.4% Diabetes:              >  6.4% Glycemic control for   <7.0% adults with diabetes    Mean Plasma Glucose 151.33 mg/dL    Comment: Performed at [facility] [address], [place], [place] [address]  Basic metabolic panel     Status: Abnormal   Collection Time: 11/23/18 12:45 AM  Result Value Ref Range   Sodium 136 135 - 145 mmol/L   Potassium 3.5 3.5 - 5.1 mmol/L   Chloride 98 98 - 111 mmol/L   CO2 26 22 - 32 mmol/L   Glucose, Bld 185 (H) 70 - 99 mg/dL   BUN <5 (L) 6 - 20 mg/dL   Creatinine, Ser 0.86  0.61 - 1.24 mg/dL   Calcium 8.2 (L) 8.9 - 10.3 mg/dL   GFR calc non Af Amer >60 >60 mL/min   GFR calc Af Amer >60 >60 mL/min   Anion gap 12 5 - 15    Comment: Performed at [facility], [place] [address] [place], [place] [address]  CBC     Status: Abnormal   Collection Time: 11/23/18 12:45 AM  Result Value Ref Range   WBC 5.2 4.0 - 10.5 K/uL   RBC 3.86 (L) 4.22 - 5.81 MIL/uL   Hemoglobin 11.8 (L) 13.0 - 17.0 g/dL   HCT 34.4 (L) 39.0 - 52.0 %   MCV 89.1 80.0 - 100.0 fL   MCH 30.6 26.0 - 34.0 pg   MCHC 34.3 30.0 - 36.0 g/dL   RDW 14.6 11.5 - 15.5 %   Platelets 387 150 - 400 K/uL   nRBC 0.0 0.0 - 0.2 %    Comment: Performed at [facility], [place] [address] [place],  [address]    Ct Head Wo Contrast  Result Date: 11/22/2018 CLINICAL DATA:  Posttraumatic headache, nausea and vomiting, LEFT side head pain; just got back from [facility], [place], went to hospital and was told he we had a blood clot in his brain, unclear about when he actually fell and when he arrived home EXAM: CT HEAD WITHOUT CONTRAST TECHNIQUE: Contiguous axial images were obtained from the base of the skull through the vertex without intravenous contrast. Sagittal and coronal MPR images reconstructed from axial data set. COMPARISON:  06/07/2017 FINDINGS: Brain: Normal ventricular morphology. Intermediate attenuation LEFT frontoparietal subdural hematoma measuring up to 14 mm thick. Mass effect upon the lateral aspect of the RIGHT hemisphere. Additionally, older appearing intermediate to low attenuation RIGHT frontoparietal subdural hematoma measuring up to 8 mm thick with mild mass effect upon the RIGHT hemisphere. No intraparenchymal hemorrhage, evidence acute infarct, or mass. 1 mm of LEFT-to-RIGHT midline shift. Vascular: Unremarkable Skull: Intact Sinuses/Orbits: Clear Other: N/A IMPRESSION: Intermediate attenuation LEFT frontoparietal subdural hematoma measuring 14 mm thick, exerting mass effect upon  the LEFT hemisphere. Intermediate to low attenuation RIGHT frontal parietal subdural hematoma up to 8 mm thick with mild mass effect upon RIGHT hemisphere. No intraparenchymal hemorrhage or infarction. 1 mm LEFT-to-RIGHT midline shift. Critical Value/emergent results were called by telephone at the time of interpretation on 11/22/2018 at 5:31 pm to Dr. Julianne Rice , who verbally acknowledged these results. Electronically Signed   By: Lavonia Dana M.D.   On: 11/22/2018 17:31   Impression/Plan   43 y.o. male with bilateral subacute frontoparietal subdural hematomas after being found altered while in [facility] approximately 9 days ago, + head trauma several days prior.  He is grossly neurologically intact with exception of mild incoordination.   No indication for emergent  neurosurgical intervention, however he will need to undergo surgical evacuation during this hospitalization.   Due to extensive alcohol history, patient has been admitted under triad hospitalist service.  Bilateral frontoparietal SDH, L>R: Reviewed with Dr Vertell Limber. No emergent need for surgery. He will need to undergo bilateral craniotomies for evacuation. Hopeful to do this tomorrow. Will await medical readiness per primary team.   Alcohol abuse - per primary Abdominal pain, history of chronic pancreatitis  - per primary Nausea with vomiting - per primary DM - per primary  Ferne Reus, PA-C [facility]

## 2018-11-23 NOTE — H&P (View-Only) (Signed)
Chief Complaint   Chief Complaint  Patient presents with   Head Injury    HPI   Consult requested by: Dr Lita Mains Reason for consult: Bilateral SDH  HPI: Willie [name] is a 43 y.o. male with history HTN, DM, hyperlipidemia, alcohol abuse, chronic alcoholic pancreatis who presented to [facility] yesterday due to worsening nausea with vomiting and headache.  History begins July 18th when he was in [place].  He was noted to be combative and altered so he was taken to emergency room where he was diagnosed with left > right bilateral subdural hematomas, 69m and 173m Did note striking head on washer several days prior.  It was recommended that he undergo craniotomy for evacuation of the left subdural hematoma however patient left against medical advice.  Symptoms have been gradually worsening.   He underwent a head CT yesterday which revealed possible slight worsening in bilateral subdural hematomas .   NSY consultation requested.    He currently complains of mild occipital headache, nausea with vomiting and epigastric cramping. He denies changes in vision,  Dizziness, N/T, weakness or focal deficit. He was given amAzerbaijannd Seroquel last night for sleep.   [facility] [place] Patient Active Problem List   Diagnosis Date Noted   Abdominal pain 11/22/2018   SDH (subdural hematoma) (HCAlpaugh07/26/2020   Abnormal LFTs 11/22/2018   IDDM (insulin dependent diabetes mellitus) (HCCrescent Beach05/16/2020   Acute hepatitis 09/11/2018   Alcohol-induced mood disorder (HCVillisca   DKA (diabetic ketoacidoses) (HCFarmington04/04/2017   Alcohol withdrawal (HCSavonburg04/04/2017   Abnormal CT scan, colon    Benign neoplasm of descending colon    Lower GI bleed 07/04/2017   Transaminitis 07/04/2017   Pain    Bleeding internal hemorrhoids    Anemia of chronic disease    Left arm weakness    Anemia, blood loss    Vomiting alone  03/27/2017   Chest pain 03/27/2017   Nausea and vomiting    Lactic acidosis 03/26/2017   History of alcohol abuse 06/25/2016   Tobacco abuse 06/25/2016   Anxiety state 11/20/2015   Dental infection 07/10/2015   Mood disorder (HCLeadville North12/20/2016   Eczema 06/27/2014   GERD (gastroesophageal reflux disease) 05/24/2014   Snoring 04/07/2013   Suicide attempt by substance overdose (HCWyocena10/15/2014   Alcohol abuse 12/31/2012   Erectile dysfunction 12/09/2012   Hematuria 06/11/2012   Vision problem 02/02/2012   Insomnia 12/20/2011   Chronic pancreatitis (HCBrookdale10/12/2010   Depression with anxiety 11/13/2010   Hyperlipemia 06/27/2010   Diabetes type 2, uncontrolled (HCGrants Pass01/24/2012   Essential hypertension 05/22/2010    PMH: Past Medical History:  Diagnosis Date   Alcohol abuse    Alcoholic pancreatitis    recurrent   Depression    Diabetes mellitus, type II (HCVirgil   [place] 03/2010   GERD (gastroesophageal reflux disease)    High cholesterol    History of low back pain    with herniated disc L5 S1 with right lumbar radiculopathy   Hypertension    Sleep apnea    does not wear a CPAP    PSH: Past Surgical History:  Procedure Laterality Date   COLONOSCOPY W/ BIOPSIES  09/2010   Dr. JaArdis Hughs for intermittent rectal bleeding.  Mild sigmoid to descending diverticulosis.  Mild left colon erythema, benign biopsy, probably "prep effect"   COLONOSCOPY WITH PROPOFOL N/A 07/06/2017  Procedure: COLONOSCOPY WITH PROPOFOL;  Surgeon: Jerene Bears, MD;  Location: [facility];  Service: Gastroenterology;  Laterality: N/A;   LUMBAR MICRODISCECTOMY  ~ 2004   Dr Ellene Route    Medications Prior to Admission  Medication Sig Dispense Refill Last Dose   ALPRAZolam (XANAX) 1 MG tablet Take 1 tablet (1 mg total) by mouth 3 (three) times daily as needed for anxiety. 15 tablet 0    amLODipine (NORVASC) 10 MG tablet Take 1 tablet (10 mg total) by mouth daily. 90  tablet 1    blood glucose meter kit and supplies KIT Dispense based on patient and insurance preference. Use up to four times daily as directed. (FOR ICD-9 250.00, 250.01). 1 each 0    CIALIS 10 MG tablet TAKE 1 TABLET BY MOUTH DAILY AS NEEDED FOR ERECTILE DYSFUNCTION (Patient taking differently: Take 10 mg by mouth daily as needed for erectile dysfunction. ) 10 tablet 5    cloNIDine (CATAPRES) 0.1 MG tablet Take 1 tablet (0.1 mg total) by mouth 3 (three) times daily. 270 tablet 1    Continuous Blood Gluc Sensor (FREESTYLE LIBRE 14 DAY SENSOR) MISC Inject 1 each into the skin every 14 (fourteen) days. 6 each 3    cyclobenzaprine (FLEXERIL) 5 MG tablet Take 1 tablet (5 mg total) by mouth 2 (two) times daily as needed for muscle spasms. 10 tablet 0    escitalopram (LEXAPRO) 20 MG tablet Take 1 tablet (20 mg total) by mouth daily. 90 tablet 0    folic acid (FOLVITE) 1 MG tablet Take 1 tablet (1 mg total) by mouth daily. 30 tablet 5    glucose blood (FREESTYLE LITE) test strip Use as instructed 100 each 12    Insulin Glargine (LANTUS SOLOSTAR) 100 UNIT/ML Solostar Pen Inject 25 Units into the skin at bedtime. 45 mL 0    insulin lispro (HUMALOG KWIKPEN) 100 UNIT/ML KwikPen Inject 0.05-0.15 mLs (5-15 Units total) into the skin 3 (three) times daily. Inject subcutaneously 3 times a day (just before each meal) 09-10-08 units Humalog 5 units with small meal, 10 units with medium sized meal, and 15 units with large meal. 10 mL 2    Insulin Pen Needle (ULTICARE SHORT PEN NEEDLES) 31G X 8 MM MISC Use once daily to inject insulin. 100 each 1    Lancets (FREESTYLE) lancets 2 (two) times daily 200 each 5    lisinopril (ZESTRIL) 20 MG tablet Take 1 tablet (20 mg total) by mouth daily. 90 tablet 1    Multiple Vitamins-Minerals (MULTIVITAMIN) tablet Take 1 tablet by mouth daily. 30 tablet 0    ondansetron (ZOFRAN ODT) 4 MG disintegrating tablet 11m ODT q4 hours prn nausea/vomit (Patient taking  differently: Take 4 mg by mouth every 4 (four) hours as needed for nausea or vomiting. ) 4 tablet 0    pantoprazole (PROTONIX) 40 MG tablet Take 1 tablet (40 mg total) by mouth 2 (two) times daily. 60 tablet 5    QUEtiapine (SEROQUEL) 300 MG tablet Take 1 tablet (300 mg total) by mouth at bedtime. 90 tablet 0    sildenafil (REVATIO) 20 MG tablet Take 1-2 tabs by mouth once daily as needed 30-60 minutes prior to sexual activity. 50 tablet 0    thiamine 100 MG tablet Take 1 tablet (100 mg total) by mouth daily. 90 tablet 1    zolpidem (AMBIEN) 5 MG tablet Take 1 tablet (5 mg total) by mouth at bedtime as needed. for sleep 30 tablet 2  SH: Social History   Tobacco Use   Smoking status: Former Smoker    Packs/day: 0.25    Years: 1.00    Pack years: 0.25    Types: Cigarettes    Start date: 04/29/2016   Smokeless tobacco: Never Used   Tobacco comment: 4 Cigarettes a day   Substance Use Topics   Alcohol use: Yes    Comment: 12 pack beer a day   Drug use: Not Currently    Comment: none in years    MEDS: Prior to Admission medications   Medication Sig Start Date End Date Taking? Authorizing Provider  ALPRAZolam Duanne Moron) 1 MG tablet Take 1 tablet (1 mg total) by mouth 3 (three) times daily as needed for anxiety. 09/15/18   Oswald Hillock, MD  amLODipine (NORVASC) 10 MG tablet Take 1 tablet (10 mg total) by mouth daily. 08/17/18   Debbrah Alar, NP  blood glucose meter kit and supplies KIT Dispense based on patient and insurance preference. Use up to four times daily as directed. (FOR ICD-9 250.00, 250.01). 07/24/18   Debbrah Alar, NP  CIALIS 10 MG tablet TAKE 1 TABLET BY MOUTH DAILY AS NEEDED FOR ERECTILE DYSFUNCTION Patient taking differently: Take 10 mg by mouth daily as needed for erectile dysfunction.  07/09/18   Debbrah Alar, NP  cloNIDine (CATAPRES) 0.1 MG tablet Take 1 tablet (0.1 mg total) by mouth 3 (three) times daily. 08/17/18   Debbrah Alar, NP    Continuous Blood Gluc Sensor (FREESTYLE LIBRE 14 DAY SENSOR) MISC Inject 1 each into the skin every 14 (fourteen) days. 04/27/18   Debbrah Alar, NP  cyclobenzaprine (FLEXERIL) 5 MG tablet Take 1 tablet (5 mg total) by mouth 2 (two) times daily as needed for muscle spasms. 11/24/17   Horton, Barbette Hair, MD  escitalopram (LEXAPRO) 20 MG tablet Take 1 tablet (20 mg total) by mouth daily. 08/17/18   Debbrah Alar, NP  folic acid (FOLVITE) 1 MG tablet Take 1 tablet (1 mg total) by mouth daily. 08/29/17   Debbrah Alar, NP  glucose blood (FREESTYLE LITE) test strip Use as instructed 07/24/18   Debbrah Alar, NP  Insulin Glargine (LANTUS SOLOSTAR) 100 UNIT/ML Solostar Pen Inject 25 Units into the skin at bedtime. 08/17/18   Debbrah Alar, NP  insulin lispro (HUMALOG KWIKPEN) 100 UNIT/ML KwikPen Inject 0.05-0.15 mLs (5-15 Units total) into the skin 3 (three) times daily. Inject subcutaneously 3 times a day (just before each meal) 09-10-08 units Humalog 5 units with small meal, 10 units with medium sized meal, and 15 units with large meal. 09/15/18   Darrick Meigs, Marge Duncans, MD  Insulin Pen Needle (ULTICARE SHORT PEN NEEDLES) 31G X 8 MM MISC Use once daily to inject insulin. 04/27/18   Debbrah Alar, NP  Lancets (FREESTYLE) lancets 2 (two) times daily 02/27/17   Debbrah Alar, NP  lisinopril (ZESTRIL) 20 MG tablet Take 1 tablet (20 mg total) by mouth daily. 08/17/18   Debbrah Alar, NP  Multiple Vitamins-Minerals (MULTIVITAMIN) tablet Take 1 tablet by mouth daily. 02/12/16   Palumbo, April, MD  ondansetron (ZOFRAN ODT) 4 MG disintegrating tablet 52m ODT q4 hours prn nausea/vomit Patient taking differently: Take 4 mg by mouth every 4 (four) hours as needed for nausea or vomiting.  10/13/17   BMalvin Johns MD  pantoprazole (PROTONIX) 40 MG tablet Take 1 tablet (40 mg total) by mouth 2 (two) times daily. 08/17/18   ODebbrah Alar NP  QUEtiapine (SEROQUEL) 300 MG tablet Take 1  tablet (300 mg  total) by mouth at bedtime. 08/17/18   Debbrah Alar, NP  sildenafil (REVATIO) 20 MG tablet Take 1-2 tabs by mouth once daily as needed 30-60 minutes prior to sexual activity. 07/24/18   Debbrah Alar, NP  thiamine 100 MG tablet Take 1 tablet (100 mg total) by mouth daily. 03/20/18   Saguier, Percell Miller, PA-C  zolpidem (AMBIEN) 5 MG tablet Take 1 tablet (5 mg total) by mouth at bedtime as needed. for sleep 08/18/18   Debbrah Alar, NP    ALLERGY: Allergies  Allergen Reactions   Invokamet [Canagliflozin-Metformin Hcl] Other (See Comments)    Lactic acidosis   Metformin And Related Other (See Comments)    DRASTIC drop in blood sugar    Social History   Tobacco Use   Smoking status: Former Smoker    Packs/day: 0.25    Years: 1.00    Pack years: 0.25    Types: Cigarettes    Start date: 04/29/2016   Smokeless tobacco: Never Used   Tobacco comment: 4 Cigarettes a day   Substance Use Topics   Alcohol use: Yes    Comment: 12 pack beer a day     Family History  Problem Relation Age of Onset   Diabetes Mother    Lung cancer Brother        twin brother   Pancreatic cancer Paternal Aunt    Colon cancer Neg Hx    Stomach cancer Neg Hx      ROS   Review of Systems  Constitutional: Negative.   HENT: Negative.   Eyes: Negative.  Negative for blurred vision, double vision and photophobia.  Respiratory: Negative.   Cardiovascular: Negative.   Gastrointestinal: Positive for nausea and vomiting.  Genitourinary: Negative.   Musculoskeletal: Negative.   Skin: Negative.   Neurological: Positive for headaches. Negative for dizziness, tingling, tremors, sensory change, speech change, focal weakness, seizures and weakness.    Exam   Vitals:   11/23/18 0422 11/23/18 0527  BP:  96/66  Pulse: 99 88  Resp:    Temp:    SpO2:     General appearance: WDWN, NAD, drowsy but easily awakened Eyes: No scleral injection Cardiovascular: Regular rate and  rhythm without murmurs, rubs, gallops. No edema or variciosities. Distal pulses normal. Pulmonary: Effort normal, non-labored breathing Musculoskeletal:     Muscle tone upper extremities: Normal    Muscle tone lower extremities: Normal    Motor exam: Upper Extremities Deltoid Bicep Tricep Grip  Right 5/5 5/5 5/5 5/5  Left 5/5 5/5 5/5 5/5   Lower Extremity IP Quad PF DF EHL  Right 5/5 5/5 5/5 5/5 5/5  Left 5/5 5/5 5/5 5/5 5/5   Neurological Mental Status:    - Patient is awake, alert, oriented to person, place, month, year, and situation    - Patient is able to give a clear and coherent history.    - No signs of aphasia or neglect Cranial Nerves    - II: Visual Fields are full. PERRL    - III/IV/VI: EOMI without ptosis or diploplia. Mild left nystagmus    - V: Facial sensation is grossly normal    - VII: Facial movement is symmetric.     - VIII: hearing is intact to voice    - X: Uvula elevates symmetrically    - XI: Shoulder shrug is symmetric.    - XII: tongue is midline without atrophy or fasciculations.  Sensory: Sensation grossly intact to LT Plantars   - Toes are downgoing  bilaterally. Cerebellar    - FNF with slight incoordination, L>R Mild left drift   Results - Imaging/Labs   Results for orders placed or performed during the hospital encounter of 11/22/18 (from the past 48 hour(s))  CBC with Differential/Platelet     Status: Abnormal   Collection Time: 11/22/18  5:30 PM  Result Value Ref Range   WBC 4.7 4.0 - 10.5 K/uL   RBC 4.14 (L) 4.22 - 5.81 MIL/uL   Hemoglobin 12.4 (L) 13.0 - 17.0 g/dL   HCT 37.4 (L) 39.0 - 52.0 %   MCV 90.3 80.0 - 100.0 fL   MCH 30.0 26.0 - 34.0 pg   MCHC 33.2 30.0 - 36.0 g/dL   RDW 14.8 11.5 - 15.5 %   Platelets 433 (H) 150 - 400 K/uL   nRBC 0.0 0.0 - 0.2 %   Neutrophils Relative % 80 %   Neutro Abs 3.7 1.7 - 7.7 K/uL   Lymphocytes Relative 9 %   Lymphs Abs 0.4 (L) 0.7 - 4.0 K/uL   Monocytes Relative 8 %   Monocytes Absolute 0.4  0.1 - 1.0 K/uL   Eosinophils Relative 2 %   Eosinophils Absolute 0.1 0.0 - 0.5 K/uL   Basophils Relative 1 %   Basophils Absolute 0.1 0.0 - 0.1 K/uL   Immature Granulocytes 0 %   Abs Immature Granulocytes 0.02 0.00 - 0.07 K/uL    Comment: Performed at [facility], [address] [place], [place] [address]  Comprehensive metabolic panel     Status: Abnormal   Collection Time: 11/22/18  5:30 PM  Result Value Ref Range   Sodium 137 135 - 145 mmol/L   Potassium 4.0 3.5 - 5.1 mmol/L   Chloride 96 (L) 98 - 111 mmol/L   CO2 23 22 - 32 mmol/L   Glucose, Bld 233 (H) 70 - 99 mg/dL   BUN <5 (L) 6 - 20 mg/dL   Creatinine, Ser 0.87 0.61 - 1.24 mg/dL   Calcium 8.2 (L) 8.9 - 10.3 mg/dL   Total Protein 6.4 (L) 6.5 - 8.1 g/dL   Albumin 3.0 (L) 3.5 - 5.0 g/dL   AST 152 (H) 15 - 41 U/L   ALT 70 (H) 0 - 44 U/L   Alkaline Phosphatase 160 (H) 38 - 126 U/L   Total Bilirubin 1.1 0.3 - 1.2 mg/dL   GFR calc non Af Amer >60 >60 mL/min   GFR calc Af Amer >60 >60 mL/min   Anion gap 18 (H) 5 - 15    Comment: Performed at [facility], [address] [place], [place] [address]  Lipase, blood     Status: None   Collection Time: 11/22/18  5:30 PM  Result Value Ref Range   Lipase 16 11 - 51 U/L    Comment: Performed at [facility], [address] [place], [place] [address]  Protime-INR     Status: None   Collection Time: 11/22/18  5:30 PM  Result Value Ref Range   Prothrombin Time 13.1 11.4 - 15.2 seconds   INR 1.0 0.8 - 1.2    Comment: (NOTE) INR goal varies based on device and disease states. Performed at [facility], [address] [place], [place] [address]   APTT     Status: None   Collection Time: 11/22/18  5:30 PM  Result Value Ref Range   aPTT 27 24 - 36 seconds    Comment: Performed  at [facility], [address] [place], [place] [address]  SARS Coronavirus 2 (Performed in [facility] hospital lab)     Status: None   Collection  Time: 11/22/18  5:35 PM   Specimen: Nasopharyngeal Swab  Result Value Ref Range   SARS Coronavirus 2 NEGATIVE NEGATIVE    Comment: (NOTE) If result is NEGATIVE SARS-CoV-2 target nucleic acids are NOT DETECTED. The SARS-CoV-2 RNA is generally detectable in upper and lower  respiratory specimens during the acute phase of infection. The lowest  concentration of SARS-CoV-2 viral copies this assay can detect is 250  copies / mL. A negative result does not preclude SARS-CoV-2 infection  and should not be used as the sole basis for treatment or other  patient management decisions.  A negative result may occur with  improper specimen collection / handling, submission of specimen other  than nasopharyngeal swab, presence of viral mutation(s) within the  areas targeted by this assay, and inadequate number of viral copies  (<250 copies / mL). A negative result must be combined with clinical  observations, patient history, and epidemiological information. If result is POSITIVE SARS-CoV-2 target nucleic acids are DETECTED. The SARS-CoV-2 RNA is generally detectable in upper and lower  respiratory specimens dur ing the acute phase of infection.  Positive  results are indicative of active infection with SARS-CoV-2.  Clinical  correlation with patient history and other diagnostic information is  necessary to determine patient infection status.  Positive results do  not rule out bacterial infection or co-infection with other viruses. If result is PRESUMPTIVE POSTIVE SARS-CoV-2 nucleic acids MAY BE PRESENT.   A presumptive positive result was obtained on the submitted specimen  and confirmed on repeat testing.  While 2019 novel coronavirus  (SARS-CoV-2) nucleic acids may be present in the submitted sample  additional confirmatory testing may be necessary for epidemiological  and / or clinical management purposes  to differentiate between  SARS-CoV-2 and other Sarbecovirus currently known to infect  humans.  If clinically indicated additional testing with an alternate test  methodology [phone]) is advised. The SARS-CoV-2 RNA is generally  detectable in upper and lower respiratory sp ecimens during the acute  phase of infection. The expected result is Negative. Fact Sheet for Patients:  StrictlyIdeas.no Fact Sheet for Healthcare Providers: BankingDealers.co.za This test is not yet approved or cleared by the [place] FDA and has been authorized for detection and/or diagnosis of SARS-CoV-2 by FDA under an Emergency Use Authorization (EUA).  This EUA will remain in effect (meaning this test can be used) for the duration of the COVID-19 declaration under Section 564(b)(1) of the Act, 21 U.S.C. section 360bbb-3(b)(1), unless the authorization is terminated or revoked sooner. Performed at [facility], [address] [place], [place] [address]   POC CBG, ED     Status: Abnormal   Collection Time: 11/22/18 10:26 PM  Result Value Ref Range   Glucose-Capillary 168 (H) 70 - 99 mg/dL  Glucose, capillary     Status: Abnormal   Collection Time: 11/23/18 12:32 AM  Result Value Ref Range   Glucose-Capillary 186 (H) 70 - 99 mg/dL  Hemoglobin A1c     Status: Abnormal   Collection Time: 11/23/18 12:45 AM  Result Value Ref Range   Hgb A1c MFr Bld 6.9 (H) 4.8 - 5.6 %    Comment: (NOTE) Pre diabetes:          5.7%-6.4% Diabetes:              >  6.4% Glycemic control for   <7.0% adults with diabetes    Mean Plasma Glucose 151.33 mg/dL    Comment: Performed at [facility] [address], [place], [place] [address]  Basic metabolic panel     Status: Abnormal   Collection Time: 11/23/18 12:45 AM  Result Value Ref Range   Sodium 136 135 - 145 mmol/L   Potassium 3.5 3.5 - 5.1 mmol/L   Chloride 98 98 - 111 mmol/L   CO2 26 22 - 32 mmol/L   Glucose, Bld 185 (H) 70 - 99 mg/dL   BUN <5 (L) 6 - 20 mg/dL   Creatinine, Ser 0.86  0.61 - 1.24 mg/dL   Calcium 8.2 (L) 8.9 - 10.3 mg/dL   GFR calc non Af Amer >60 >60 mL/min   GFR calc Af Amer >60 >60 mL/min   Anion gap 12 5 - 15    Comment: Performed at [facility], [place] [address] [place], [place] [address]  CBC     Status: Abnormal   Collection Time: 11/23/18 12:45 AM  Result Value Ref Range   WBC 5.2 4.0 - 10.5 K/uL   RBC 3.86 (L) 4.22 - 5.81 MIL/uL   Hemoglobin 11.8 (L) 13.0 - 17.0 g/dL   HCT 34.4 (L) 39.0 - 52.0 %   MCV 89.1 80.0 - 100.0 fL   MCH 30.6 26.0 - 34.0 pg   MCHC 34.3 30.0 - 36.0 g/dL   RDW 14.6 11.5 - 15.5 %   Platelets 387 150 - 400 K/uL   nRBC 0.0 0.0 - 0.2 %    Comment: Performed at [facility], [place] [address] [place],  [address]    Ct Head Wo Contrast  Result Date: 11/22/2018 CLINICAL DATA:  Posttraumatic headache, nausea and vomiting, LEFT side head pain; just got back from [facility], [place], went to hospital and was told he we had a blood clot in his brain, unclear about when he actually fell and when he arrived home EXAM: CT HEAD WITHOUT CONTRAST TECHNIQUE: Contiguous axial images were obtained from the base of the skull through the vertex without intravenous contrast. Sagittal and coronal MPR images reconstructed from axial data set. COMPARISON:  06/07/2017 FINDINGS: Brain: Normal ventricular morphology. Intermediate attenuation LEFT frontoparietal subdural hematoma measuring up to 14 mm thick. Mass effect upon the lateral aspect of the RIGHT hemisphere. Additionally, older appearing intermediate to low attenuation RIGHT frontoparietal subdural hematoma measuring up to 8 mm thick with mild mass effect upon the RIGHT hemisphere. No intraparenchymal hemorrhage, evidence acute infarct, or mass. 1 mm of LEFT-to-RIGHT midline shift. Vascular: Unremarkable Skull: Intact Sinuses/Orbits: Clear Other: N/A IMPRESSION: Intermediate attenuation LEFT frontoparietal subdural hematoma measuring 14 mm thick, exerting mass effect upon  the LEFT hemisphere. Intermediate to low attenuation RIGHT frontal parietal subdural hematoma up to 8 mm thick with mild mass effect upon RIGHT hemisphere. No intraparenchymal hemorrhage or infarction. 1 mm LEFT-to-RIGHT midline shift. Critical Value/emergent results were called by telephone at the time of interpretation on 11/22/2018 at 5:31 pm to Dr. Julianne Rice , who verbally acknowledged these results. Electronically Signed   By: Lavonia Dana M.D.   On: 11/22/2018 17:31   Impression/Plan   43 y.o. male with bilateral subacute frontoparietal subdural hematomas after being found altered while in [facility] approximately 9 days ago, + head trauma several days prior.  He is grossly neurologically intact with exception of mild incoordination.   No indication for emergent  neurosurgical intervention, however he will need to undergo surgical evacuation during this hospitalization.   Due to extensive alcohol history, patient has been admitted under triad hospitalist service.  Bilateral frontoparietal SDH, L>R: Reviewed with Dr Vertell Limber. No emergent need for surgery. He will need to undergo bilateral craniotomies for evacuation. Hopeful to do this tomorrow. Will await medical readiness per primary team.   Alcohol abuse - per primary Abdominal pain, history of chronic pancreatitis  - per primary Nausea with vomiting - per primary DM - per primary  Ferne Reus, PA-C [facility]

## 2018-11-23 NOTE — Progress Notes (Signed)
Subjective: Patient reports sleepy, but arousable  Objective: Vital signs in last 24 hours: Temp:  [97.3 F (36.3 C)-99.1 F (37.3 C)] 97.3 F (36.3 C) (07/27 0718) Pulse Rate:  [81-125] 81 (07/27 0718) Resp:  [13-20] 18 (07/27 0718) BP: (82-161)/(60-109) 118/79 (07/27 0718) SpO2:  [97 %-100 %] 99 % (07/27 0718) Weight:  [61.2 kg-76.2 kg] 76.2 kg (07/26 2333)  Intake/Output from previous day: 07/26 0701 - 07/27 0700 In: 2106.8 [I.V.:100; IV Piggyback:2006.8] Out: -  Intake/Output this shift: No intake/output data recorded.  Physical Exam: Right pronator drift, no weakness.  Sleepy, dysarthric speech (likely secondary to medications).  MAEW and follows commands.  Lab Results: Recent Labs    11/22/18 1730 11/23/18 0045  WBC 4.7 5.2  HGB 12.4* 11.8*  HCT 37.4* 34.4*  PLT 433* 387   BMET Recent Labs    11/22/18 1730 11/23/18 0045  NA 137 136  K 4.0 3.5  CL 96* 98  CO2 23 26  GLUCOSE 233* 185*  BUN <5* <5*  CREATININE 0.87 0.86  CALCIUM 8.2* 8.2*    Studies/Results: Ct Head Wo Contrast  Result Date: 11/22/2018 CLINICAL DATA:  Posttraumatic headache, nausea and vomiting, LEFT side head pain; just got back from [facility], [place], went to hospital and was told he we had a blood clot in his brain, unclear about when he actually fell and when he arrived home EXAM: CT HEAD WITHOUT CONTRAST TECHNIQUE: Contiguous axial images were obtained from the base of the skull through the vertex without intravenous contrast. Sagittal and coronal MPR images reconstructed from axial data set. COMPARISON:  06/07/2017 FINDINGS: Brain: Normal ventricular morphology. Intermediate attenuation LEFT frontoparietal subdural hematoma measuring up to 14 mm thick. Mass effect upon the lateral aspect of the RIGHT hemisphere. Additionally, older appearing intermediate to low attenuation RIGHT frontoparietal subdural hematoma measuring up to 8 mm thick with mild mass effect upon the RIGHT  hemisphere. No intraparenchymal hemorrhage, evidence acute infarct, or mass. 1 mm of LEFT-to-RIGHT midline shift. Vascular: Unremarkable Skull: Intact Sinuses/Orbits: Clear Other: N/A IMPRESSION: Intermediate attenuation LEFT frontoparietal subdural hematoma measuring 14 mm thick, exerting mass effect upon the LEFT hemisphere. Intermediate to low attenuation RIGHT frontal parietal subdural hematoma up to 8 mm thick with mild mass effect upon RIGHT hemisphere. No intraparenchymal hemorrhage or infarction. 1 mm LEFT-to-RIGHT midline shift. Critical Value/emergent results were called by telephone at the time of interpretation on 11/22/2018 at 5:31 pm to Dr. Julianne Rice , who verbally acknowledged these results. Electronically Signed   By: Lavonia Dana M.D.   On: 11/22/2018 17:31    Assessment/Plan: Patient will require bilateral craniotomies for SDH.   I will plan on doing so tomorrow after patient is stabilized medically.  I am OK with him eating today and being made NPO after midnight.      LOS: 1 day    Peggyann Shoals, MD 11/23/2018, 8:14 AM

## 2018-11-23 NOTE — Plan of Care (Signed)

## 2018-11-23 NOTE — Progress Notes (Addendum)
PROGRESS NOTE    Willie [name]  ZOX:[id number] DOB: March 13, 1976 DOA: 11/22/2018 PCP: Debbrah Alar, NP    Brief Narrative:  43 year old male who presented with abdominal pain and headaches.  He does have significant past medical history for hypertension, dyslipidemia, type 2 diabetes mellitus, GERD, depression, anxiety and history of alcohol abuse.  He was diagnosed with subdural hematoma November 14, 2018, he was recommended evacuation but he left the hospital (in [facility] [place].  He had persistent symptoms of headache, nausea, and vomiting.  On his initial physical examination blood pressure 161/109, heart rate 100, respiratory rate 18, temperature 98.1, oxygen saturation 98%.  His lungs are clear to auscultation bilaterally, heart S1-S2 present rhythm, the abdomen was tender in the midline, no lower extremity edema.  Patient was awake and alert, nonfocal. Sodium 137, potassium 4.0, chloride 96, bicarb 23, glucose 233, BUN less than 5, creatinine 0.87, AST 152, ALT 70,  lipase 16, white count 4.7, hemoglobin 12.4, hematocrit 37.4, platelets 433.  SARS COVID-19 negative.  Head CT with left frontoparietal subdural hematoma measuring 14 mm thick, exerting mass-effect upon the left hemisphere.  Right frontoparietal subdural hematoma up to 8 mm thick with mild mass-effect of the right hemisphere.  EKG 111 bpm, normal axis, QTC 500, sinus rhythm, no ST segment or T wave changes.  Positive PVC.  Patient was admitted to the hospital working diagnosis of bilateral subdural hematoma, with intracranial hypertension.    Assessment & Plan:   Principal Problem:   SDH (subdural hematoma) (HCC) Active Problems:   Diabetes type 2, uncontrolled (HCC)   Essential hypertension   Depression with anxiety   Chronic pancreatitis (HCC)   Alcohol abuse   GERD (gastroesophageal reflux disease)   Abdominal pain   Abnormal LFTs   1. Bilateral subdural hematoma/ traumatic. Patient with  persistent headache, no further nausea or vomiting, no abdominal pain. Plan for bilateral craniotomies in am per neurosurgery. NPO past midnight.   2. T2DM. Fasting glucose this am at 185, will continue glucose cover and monitoring with insulin sliding scale. Continue reduced dose of basal insulin 14 units.   3. HTN. Blood pressure systolic 811 to 914 mmHg, patient at home with positive orthostatic symptoms triggering falls, unclear syncope. NOted prolonged Qtc. Continue telemetry monitoring. Will check echocardiogram. DC IV fluids for now.   4. Chronic alcohol abuse. Currently no agitation or confusion, continue with as needed lorezepam per CIWA protocol, neuro checks per unit protocol, will need physical therapy evaluation post op.   5. GERD. Continue antiacid therapy.   6. Depression. Continue quetiapine and escitalopram. PRN hydroxyzine.   DVT prophylaxis: scd   Code Status: full Family Communication: no family at the bedside  Disposition Plan/ discharge barriers: pending OR in am.   Body mass index is 22.78 kg/m. Malnutrition Type:      Malnutrition Characteristics:      Nutrition Interventions:     RN Pressure Injury Documentation:     Consultants:   Neurosurgery   Procedures:     Antimicrobials:       Subjective: Patient continue to have dull headache, constant, with no radiation, no worsening factors, improved with analgesics, no associated nausea or vomiting.   Objective: Vitals:   11/23/18 0422 11/23/18 0527 11/23/18 0718 11/23/18 1126  BP:  96/66 118/79 115/88  Pulse: 99 88 81 89  Resp:   18 16  Temp:   (!) 97.3 F (36.3 C) 98.1 F (36.7 C)  TempSrc:   Oral  Oral  SpO2:   99% 100%  Weight:      Height:        Intake/Output Summary (Last 24 hours) at 11/23/2018 1214 Last data filed at 11/23/2018 1126 Gross per 24 hour  Intake 2106.82 ml  Output 200 ml  Net 1906.82 ml   Filed Weights   11/22/18 1614 11/22/18 2333  Weight: 61.2 kg 76.2  kg    Examination:   General: deconditioned  Neurology: Awake and alert, non focal  E ENT: no pallor, no icterus, oral mucosa moist Cardiovascular: No JVD. S1-S2 present, rhythmic, no gallops, rubs, or murmurs. No lower extremity edema. Pulmonary: positive breath sounds bilaterally, adequate air movement, no wheezing, rhonchi or rales. Gastrointestinal. Abdomen with no organomegaly, non tender, no rebound or guarding Skin. No rashes Musculoskeletal: no joint deformities     Data Reviewed: I have personally reviewed following labs and imaging studies  CBC: Recent Labs  Lab 11/22/18 1730 11/23/18 0045  WBC 4.7 5.2  NEUTROABS 3.7  --   HGB 12.4* 11.8*  HCT 37.4* 34.4*  MCV 90.3 89.1  PLT 433* 751   Basic Metabolic Panel: Recent Labs  Lab 11/22/18 1730 11/23/18 0045  NA 137 136  K 4.0 3.5  CL 96* 98  CO2 23 26  GLUCOSE 233* 185*  BUN <5* <5*  CREATININE 0.87 0.86  CALCIUM 8.2* 8.2*   GFR: Estimated Creatinine Clearance: 120.6 mL/min (by C-G formula based on SCr of 0.86 mg/dL). Liver Function Tests: Recent Labs  Lab 11/22/18 1730  AST 152*  ALT 70*  ALKPHOS 160*  BILITOT 1.1  PROT 6.4*  ALBUMIN 3.0*   Recent Labs  Lab 11/22/18 1730  LIPASE 16   No results for input(s): AMMONIA in the last 168 hours. Coagulation Profile: Recent Labs  Lab 11/22/18 1730  INR 1.0   Cardiac Enzymes: No results for input(s): CKTOTAL, CKMB, CKMBINDEX, TROPONINI in the last 168 hours. BNP (last 3 results) No results for input(s): PROBNP in the last 8760 hours. HbA1C: Recent Labs    11/23/18 0045  HGBA1C 6.9*   CBG: Recent Labs  Lab 11/22/18 2226 11/23/18 0032 11/23/18 0612  GLUCAP 168* 186* 81   Lipid Profile: No results for input(s): CHOL, HDL, LDLCALC, TRIG, CHOLHDL, LDLDIRECT in the last 72 hours. Thyroid Function Tests: No results for input(s): TSH, T4TOTAL, FREET4, T3FREE, THYROIDAB in the last 72 hours. Anemia Panel: No results for input(s):  VITAMINB12, FOLATE, FERRITIN, TIBC, IRON, RETICCTPCT in the last 72 hours.    Radiology Studies: I have reviewed all of the imaging during this hospital visit personally     Scheduled Meds: . escitalopram  20 mg Oral Daily  . insulin aspart  0-9 Units Subcutaneous TID WC  . insulin glargine  14 Units Subcutaneous QHS  . LORazepam  0-4 mg Intravenous Q6H   Or  . LORazepam  0-4 mg Oral Q6H  . [START ON 11/25/2018] LORazepam  0-4 mg Intravenous Q12H   Or  . [START ON 11/25/2018] LORazepam  0-4 mg Oral Q12H  . multivitamin with minerals  1 tablet Oral Daily  . pantoprazole  40 mg Oral BID  . QUEtiapine  300 mg Oral QHS  . thiamine  100 mg Oral Daily   Or  . thiamine  100 mg Intravenous Daily   Continuous Infusions: . sodium chloride 125 mL/hr at 11/23/18 0525     LOS: 1 day        Mauricio Gerome Apley, MD

## 2018-11-23 NOTE — Progress Notes (Signed)
Dr. Blaine Hamper informed of pt's BP 82/60.

## 2018-11-23 NOTE — Progress Notes (Signed)
Report received from [place], Therapist, sports [facility]) at 2242. Pt on unit at 2327, AOx4, no n&v upon admission. But c/o HA and tender abdomen with palpation. Neurosurgery informed of pt's arrival to the unit at [place].

## 2018-11-23 NOTE — Progress Notes (Signed)
  Echocardiogram 2D Echocardiogram has been performed.  Darlina Sicilian M 11/23/2018, 3:19 PM

## 2018-11-24 ENCOUNTER — Encounter (HOSPITAL_COMMUNITY): Payer: Self-pay | Admitting: Orthopedic Surgery

## 2018-11-24 ENCOUNTER — Inpatient Hospital Stay (HOSPITAL_COMMUNITY): Payer: Self-pay | Admitting: Critical Care Medicine

## 2018-11-24 ENCOUNTER — Encounter (HOSPITAL_COMMUNITY)
Admission: EM | Disposition: A | Discharge status: Home / Self Care | Payer: Self-pay | Source: Home / Self Care | Attending: Neurosurgery

## 2018-11-24 DIAGNOSIS — I6200 Nontraumatic subdural hemorrhage, unspecified: Secondary | ICD-10-CM | POA: Diagnosis present

## 2018-11-24 HISTORY — PX: CRANIOTOMY: SHX93

## 2018-11-24 LAB — GLUCOSE, CAPILLARY
Glucose-Capillary: 41 mg/dL — CL (ref 70–99)
Glucose-Capillary: 74 mg/dL (ref 70–99)
Glucose-Capillary: 42 mg/dL — CL (ref 70–99)
Glucose-Capillary: 78 mg/dL (ref 70–99)
Glucose-Capillary: 121 mg/dL — ABNORMAL HIGH (ref 70–99)
Glucose-Capillary: 42 mg/dL — CL (ref 70–99)
Glucose-Capillary: 97 mg/dL (ref 70–99)

## 2018-11-24 LAB — CBC WITH DIFFERENTIAL/PLATELET
Abs Immature Granulocytes: 0.02 10*3/uL (ref 0.00–0.07)
Basophils Absolute: 0 10*3/uL (ref 0.0–0.1)
Basophils Relative: 0 %
Eosinophils Absolute: 0.1 10*3/uL (ref 0.0–0.5)
Eosinophils Relative: 2 %
HCT: 32.4 % — ABNORMAL LOW (ref 39.0–52.0)
Hemoglobin: 10.6 g/dL — ABNORMAL LOW (ref 13.0–17.0)
Immature Granulocytes: 0 %
Lymphocytes Relative: 42 %
Lymphs Abs: 2.2 10*3/uL (ref 0.7–4.0)
MCH: 30.5 pg (ref 26.0–34.0)
MCHC: 32.7 g/dL (ref 30.0–36.0)
MCV: 93.1 fL (ref 80.0–100.0)
Monocytes Absolute: 0.6 10*3/uL (ref 0.1–1.0)
Monocytes Relative: 11 %
Neutro Abs: 2.4 10*3/uL (ref 1.7–7.7)
Neutrophils Relative %: 45 %
Platelets: 346 10*3/uL (ref 150–400)
RBC: 3.48 MIL/uL — ABNORMAL LOW (ref 4.22–5.81)
RDW: 14.4 % (ref 11.5–15.5)
WBC: 5.2 10*3/uL (ref 4.0–10.5)
nRBC: 0 % (ref 0.0–0.2)

## 2018-11-24 LAB — BASIC METABOLIC PANEL
Anion gap: 8 (ref 5–15)
BUN: <5 mg/dL — ABNORMAL LOW (ref 6–20)
CO2: 26 mmol/L (ref 22–32)
Calcium: 8.2 mg/dL — ABNORMAL LOW (ref 8.9–10.3)
Chloride: 107 mmol/L (ref 98–111)
Creatinine, Ser: 0.75 mg/dL (ref 0.61–1.24)
GFR calc Af Amer: >60 mL/min (ref >60)
GFR calc non Af Amer: >60 mL/min (ref >60)
Glucose, Bld: 57 mg/dL — ABNORMAL LOW (ref 70–99)
Potassium: 2.9 mmol/L — ABNORMAL LOW (ref 3.5–5.1)
Sodium: 141 mmol/L (ref 135–145)

## 2018-11-24 LAB — HEPATITIS PANEL, ACUTE
HCV Ab: <0.1 s/co ratio (ref 0.0–0.9)
Hep A IgM: NEGATIVE
Hep B C IgM: NEGATIVE
Hepatitis B Surface Ag: NEGATIVE

## 2018-11-24 LAB — SURGICAL PCR SCREEN
MRSA, PCR: NEGATIVE
Staphylococcus aureus: POSITIVE — AB

## 2018-11-24 LAB — ABO/RH: ABO/RH(D): O POS

## 2018-11-24 LAB — TYPE AND SCREEN
ABO/RH(D): O POS
Antibody Screen: NEGATIVE

## 2018-11-24 SURGERY — CRANIOTOMY HEMATOMA EVACUATION SUBDURAL
Anesthesia: General | Laterality: Bilateral

## 2018-11-24 MED ORDER — LABETALOL HCL 5 MG/ML IV SOLN
10.0000 mg | INTRAVENOUS | Status: DC | PRN
  Administered 2018-11-24 (×5): 10 mg via INTRAVENOUS
  Filled 2018-11-24 (×3): qty 4

## 2018-11-24 MED ORDER — SODIUM CHLORIDE 0.9 % IV SOLN
INTRAVENOUS | Status: DC | PRN
  Administered 2018-11-24: 15:00:00 via INTRAVENOUS

## 2018-11-24 MED ORDER — ENSURE ENLIVE PO LIQD
237.0000 mL | Freq: Two times a day (BID) | ORAL | Status: DC
  Administered 2018-11-25 – 2018-11-28 (×6): 237 mL via ORAL

## 2018-11-24 MED ORDER — ROCURONIUM BROMIDE 10 MG/ML (PF) SYRINGE
PREFILLED_SYRINGE | INTRAVENOUS | Status: AC
  Filled 2018-11-24: qty 10

## 2018-11-24 MED ORDER — PROPOFOL 10 MG/ML IV BOLUS
INTRAVENOUS | Status: DC | PRN
  Administered 2018-11-24: 30 mg via INTRAVENOUS
  Administered 2018-11-24: 50 mg via INTRAVENOUS
  Administered 2018-11-24: 200 mg via INTRAVENOUS

## 2018-11-24 MED ORDER — POLYETHYLENE GLYCOL 3350 17 G PO PACK: 17.0000 g | PACK | Freq: Every day | ORAL | Status: DC | PRN

## 2018-11-24 MED ORDER — LIDOCAINE-EPINEPHRINE 1 %-1:100000 IJ SOLN
INTRAMUSCULAR | Status: AC
  Filled 2018-11-24: qty 1

## 2018-11-24 MED ORDER — CEFAZOLIN SODIUM-DEXTROSE 2-4 GM/100ML-% IV SOLN
2.0000 g | INTRAVENOUS | Status: AC
  Administered 2018-11-24: 16:00:00 2 g via INTRAVENOUS

## 2018-11-24 MED ORDER — DEXTROSE 50 % IV SOLN
25.0000 g | INTRAVENOUS | Status: AC
  Administered 2018-11-24: 12:00:00 50 mL via INTRAVENOUS

## 2018-11-24 MED ORDER — ACETAMINOPHEN 650 MG RE SUPP: 650.0000 mg | RECTAL | Status: DC | PRN

## 2018-11-24 MED ORDER — SUCCINYLCHOLINE CHLORIDE 200 MG/10ML IV SOSY
PREFILLED_SYRINGE | INTRAVENOUS | Status: AC
  Filled 2018-11-24: qty 10

## 2018-11-24 MED ORDER — BUPIVACAINE-EPINEPHRINE 0.5% -1:200000 IJ SOLN
INTRAMUSCULAR | Status: DC | PRN
  Administered 2018-11-24: 10 mL

## 2018-11-24 MED ORDER — POTASSIUM CHLORIDE IN NACL 20-0.9 MEQ/L-% IV SOLN
INTRAVENOUS | Status: DC
  Administered 2018-11-24: 20:00:00 via INTRAVENOUS
  Filled 2018-11-24 (×2): qty 1000

## 2018-11-24 MED ORDER — ONDANSETRON HCL 4 MG/2ML IJ SOLN
INTRAMUSCULAR | Status: AC
  Filled 2018-11-24: qty 2

## 2018-11-24 MED ORDER — ROCURONIUM BROMIDE 10 MG/ML (PF) SYRINGE
PREFILLED_SYRINGE | INTRAVENOUS | Status: DC | PRN
  Administered 2018-11-24: 60 mg via INTRAVENOUS

## 2018-11-24 MED ORDER — FENTANYL CITRATE (PF) 250 MCG/5ML IJ SOLN
INTRAMUSCULAR | Status: DC | PRN
  Administered 2018-11-24: 100 ug via INTRAVENOUS
  Administered 2018-11-24: 50 ug via INTRAVENOUS
  Administered 2018-11-24: 100 ug via INTRAVENOUS

## 2018-11-24 MED ORDER — THROMBIN 20000 UNITS EX SOLR
CUTANEOUS | Status: DC | PRN
  Administered 2018-11-24: 16:00:00 20 mL via TOPICAL

## 2018-11-24 MED ORDER — DEXTROSE 50 % IV SOLN
INTRAVENOUS | Status: AC
  Administered 2018-11-24: 25 mL
  Filled 2018-11-24: qty 50

## 2018-11-24 MED ORDER — POTASSIUM CHLORIDE CRYS ER 20 MEQ PO TBCR
40.0000 meq | EXTENDED_RELEASE_TABLET | ORAL | Status: AC
  Administered 2018-11-24: 09:00:00 40 meq via ORAL
  Filled 2018-11-24: qty 2

## 2018-11-24 MED ORDER — DEXTROSE 50 % IV SOLN
INTRAVENOUS | Status: AC
  Administered 2018-11-24: 50 mL via INTRAVENOUS
  Filled 2018-11-24: qty 50

## 2018-11-24 MED ORDER — LABETALOL HCL 5 MG/ML IV SOLN
INTRAVENOUS | Status: AC
  Administered 2018-11-24: 18:00:00 10 mg via INTRAVENOUS
  Filled 2018-11-24: qty 4

## 2018-11-24 MED ORDER — THROMBIN 20000 UNITS EX SOLR
CUTANEOUS | Status: AC
  Filled 2018-11-24: qty 20000

## 2018-11-24 MED ORDER — DEXAMETHASONE SODIUM PHOSPHATE 10 MG/ML IJ SOLN
INTRAMUSCULAR | Status: AC
  Filled 2018-11-24: qty 1

## 2018-11-24 MED ORDER — PROMETHAZINE HCL 25 MG/ML IJ SOLN
25.0000 mg | Freq: Four times a day (QID) | INTRAMUSCULAR | Status: DC | PRN
  Administered 2018-11-24 – 2018-11-26 (×2): 25 mg via INTRAVENOUS
  Filled 2018-11-24 (×3): qty 1

## 2018-11-24 MED ORDER — SUGAMMADEX SODIUM 200 MG/2ML IV SOLN
INTRAVENOUS | Status: DC | PRN
  Administered 2018-11-24: 400 mg via INTRAVENOUS

## 2018-11-24 MED ORDER — INSULIN ASPART 100 UNIT/ML ~~LOC~~ SOLN
0.0000 [IU] | Freq: Three times a day (TID) | SUBCUTANEOUS | Status: DC
  Administered 2018-11-25 (×2): 5 [IU] via SUBCUTANEOUS
  Administered 2018-11-25 – 2018-11-26 (×4): 3 [IU] via SUBCUTANEOUS
  Administered 2018-11-27: 5 [IU] via SUBCUTANEOUS
  Administered 2018-11-27: 8 [IU] via SUBCUTANEOUS
  Administered 2018-11-28: 12:00:00 5 [IU] via SUBCUTANEOUS
  Administered 2018-11-28: 3 [IU] via SUBCUTANEOUS

## 2018-11-24 MED ORDER — LIDOCAINE 2% (20 MG/ML) 5 ML SYRINGE
INTRAMUSCULAR | Status: AC
  Filled 2018-11-24: qty 5

## 2018-11-24 MED ORDER — SODIUM CHLORIDE 0.9 % IV SOLN
INTRAVENOUS | Status: DC
  Administered 2018-11-24 (×2): via INTRAVENOUS

## 2018-11-24 MED ORDER — ORAL CARE MOUTH RINSE
15.0000 mL | Freq: Two times a day (BID) | OROMUCOSAL | Status: DC
  Administered 2018-11-24 – 2018-11-28 (×4): 15 mL via OROMUCOSAL

## 2018-11-24 MED ORDER — ONDANSETRON HCL 4 MG PO TABS: 4.0000 mg | ORAL_TABLET | ORAL | Status: DC | PRN

## 2018-11-24 MED ORDER — DEXTROSE 50 % IV SOLN
INTRAVENOUS | Status: AC
  Administered 2018-11-24: 15:00:00 50 mL via INTRAVENOUS
  Filled 2018-11-24: qty 50

## 2018-11-24 MED ORDER — LORAZEPAM 2 MG/ML IJ SOLN
2.0000 mg | Freq: Once | INTRAMUSCULAR | Status: AC
  Administered 2018-11-24: 2 mg via INTRAVENOUS
  Filled 2018-11-24: qty 1

## 2018-11-24 MED ORDER — LIDOCAINE-EPINEPHRINE 1 %-1:100000 IJ SOLN
INTRAMUSCULAR | Status: DC | PRN
  Administered 2018-11-24: 10 mL

## 2018-11-24 MED ORDER — PROPOFOL 10 MG/ML IV BOLUS
INTRAVENOUS | Status: AC
  Filled 2018-11-24: qty 40

## 2018-11-24 MED ORDER — HYDRALAZINE HCL 20 MG/ML IJ SOLN
INTRAMUSCULAR | Status: AC
  Filled 2018-11-24: qty 1

## 2018-11-24 MED ORDER — BUPIVACAINE-EPINEPHRINE (PF) 0.5% -1:200000 IJ SOLN
INTRAMUSCULAR | Status: AC
  Filled 2018-11-24: qty 30

## 2018-11-24 MED ORDER — CHLORHEXIDINE GLUCONATE CLOTH 2 % EX PADS
6.0000 | MEDICATED_PAD | Freq: Once | CUTANEOUS | Status: AC
  Administered 2018-11-24: 6 via TOPICAL

## 2018-11-24 MED ORDER — PANTOPRAZOLE SODIUM 40 MG IV SOLR
40.0000 mg | Freq: Every day | INTRAVENOUS | Status: DC
  Administered 2018-11-24 – 2018-11-26 (×3): 40 mg via INTRAVENOUS
  Filled 2018-11-24 (×3): qty 40

## 2018-11-24 MED ORDER — CEFAZOLIN SODIUM-DEXTROSE 2-4 GM/100ML-% IV SOLN
INTRAVENOUS | Status: AC
  Filled 2018-11-24: qty 100

## 2018-11-24 MED ORDER — BACITRACIN ZINC 500 UNIT/GM EX OINT
TOPICAL_OINTMENT | CUTANEOUS | Status: DC | PRN
  Administered 2018-11-24: 1 via TOPICAL

## 2018-11-24 MED ORDER — DOCUSATE SODIUM 100 MG PO CAPS
100.0000 mg | ORAL_CAPSULE | Freq: Two times a day (BID) | ORAL | Status: DC
  Administered 2018-11-24 – 2018-11-28 (×6): 100 mg via ORAL
  Filled 2018-11-24 (×6): qty 1

## 2018-11-24 MED ORDER — LABETALOL HCL 5 MG/ML IV SOLN
INTRAVENOUS | Status: DC | PRN
  Administered 2018-11-24: 7.5 mg via INTRAVENOUS

## 2018-11-24 MED ORDER — LIDOCAINE 2% (20 MG/ML) 5 ML SYRINGE
INTRAMUSCULAR | Status: DC | PRN
  Administered 2018-11-24: 60 mg via INTRAVENOUS

## 2018-11-24 MED ORDER — THROMBIN 5000 UNITS EX SOLR
OROMUCOSAL | Status: DC | PRN
  Administered 2018-11-24: 16:00:00 5 mL via TOPICAL

## 2018-11-24 MED ORDER — THROMBIN 5000 UNITS EX SOLR
CUTANEOUS | Status: AC
  Filled 2018-11-24: qty 5000

## 2018-11-24 MED ORDER — ONDANSETRON HCL 4 MG/2ML IJ SOLN
INTRAMUSCULAR | Status: DC | PRN
  Administered 2018-11-24: 4 mg via INTRAVENOUS

## 2018-11-24 MED ORDER — SUCCINYLCHOLINE CHLORIDE 200 MG/10ML IV SOSY
PREFILLED_SYRINGE | INTRAVENOUS | Status: DC | PRN
  Administered 2018-11-24: 100 mg via INTRAVENOUS

## 2018-11-24 MED ORDER — DEXAMETHASONE SODIUM PHOSPHATE 10 MG/ML IJ SOLN
INTRAMUSCULAR | Status: DC | PRN
  Administered 2018-11-24: 10 mg via INTRAVENOUS

## 2018-11-24 MED ORDER — HYDROMORPHONE HCL 1 MG/ML IJ SOLN
INTRAMUSCULAR | Status: AC
  Administered 2018-11-24: 0.5 mg via INTRAVENOUS
  Filled 2018-11-24: qty 1

## 2018-11-24 MED ORDER — CEFAZOLIN SODIUM-DEXTROSE 2-4 GM/100ML-% IV SOLN
2.0000 g | Freq: Three times a day (TID) | INTRAVENOUS | Status: AC
  Administered 2018-11-25 (×2): 2 g via INTRAVENOUS
  Filled 2018-11-24 (×2): qty 100

## 2018-11-24 MED ORDER — LORAZEPAM 1 MG PO TABS
1.0000 mg | ORAL_TABLET | Freq: Four times a day (QID) | ORAL | Status: DC | PRN
  Administered 2018-11-24 – 2018-11-25 (×2): 1 mg via ORAL
  Filled 2018-11-24 (×2): qty 1

## 2018-11-24 MED ORDER — PROMETHAZINE HCL 25 MG PO TABS
12.5000 mg | ORAL_TABLET | ORAL | Status: DC | PRN
  Administered 2018-11-25: 25 mg via ORAL
  Administered 2018-11-26: 12:00:00 12.5 mg via ORAL
  Administered 2018-11-27: 16:00:00 25 mg via ORAL
  Filled 2018-11-24 (×4): qty 1

## 2018-11-24 MED ORDER — ACETAMINOPHEN 325 MG PO TABS
650.0000 mg | ORAL_TABLET | ORAL | Status: DC | PRN
  Administered 2018-11-25 – 2018-11-28 (×3): 650 mg via ORAL
  Filled 2018-11-24 (×3): qty 2

## 2018-11-24 MED ORDER — HYDROCODONE-ACETAMINOPHEN 5-325 MG PO TABS
1.0000 | ORAL_TABLET | ORAL | Status: DC | PRN
  Administered 2018-11-26 – 2018-11-28 (×7): 1 via ORAL
  Filled 2018-11-24 (×7): qty 1

## 2018-11-24 MED ORDER — BACITRACIN ZINC 500 UNIT/GM EX OINT
TOPICAL_OINTMENT | CUTANEOUS | Status: AC
  Filled 2018-11-24: qty 28.35

## 2018-11-24 MED ORDER — ONDANSETRON HCL 4 MG/2ML IJ SOLN
4.0000 mg | INTRAMUSCULAR | Status: DC | PRN
  Administered 2018-11-24: 4 mg via INTRAVENOUS

## 2018-11-24 MED ORDER — FENTANYL CITRATE (PF) 250 MCG/5ML IJ SOLN
INTRAMUSCULAR | Status: AC
  Filled 2018-11-24: qty 5

## 2018-11-24 MED ORDER — HYDROMORPHONE HCL 1 MG/ML IJ SOLN
0.2500 mg | INTRAMUSCULAR | Status: DC | PRN
  Administered 2018-11-24: 0.5 mg via INTRAVENOUS

## 2018-11-24 MED ORDER — CHLORHEXIDINE GLUCONATE CLOTH 2 % EX PADS
6.0000 | MEDICATED_PAD | Freq: Every day | CUTANEOUS | Status: DC
  Administered 2018-11-25 – 2018-11-27 (×3): 6 via TOPICAL

## 2018-11-24 MED ORDER — MORPHINE SULFATE (PF) 2 MG/ML IV SOLN
1.0000 mg | INTRAVENOUS | Status: DC | PRN
  Administered 2018-11-24 – 2018-11-25 (×4): 2 mg via INTRAVENOUS
  Administered 2018-11-25: 1 mg via INTRAVENOUS
  Administered 2018-11-25: 2 mg via INTRAVENOUS
  Administered 2018-11-25: 1 mg via INTRAVENOUS
  Administered 2018-11-25 – 2018-11-26 (×10): 2 mg via INTRAVENOUS
  Administered 2018-11-27: 1 mg via INTRAVENOUS
  Administered 2018-11-27 (×2): 2 mg via INTRAVENOUS
  Administered 2018-11-27 – 2018-11-28 (×2): 1 mg via INTRAVENOUS
  Filled 2018-11-24 (×21): qty 1

## 2018-11-24 MED ORDER — BISACODYL 10 MG RE SUPP: 10.0000 mg | Freq: Every day | RECTAL | Status: DC | PRN

## 2018-11-24 MED ORDER — 0.9 % SODIUM CHLORIDE (POUR BTL) OPTIME
TOPICAL | Status: DC | PRN
  Administered 2018-11-24: 16:00:00 2000 mL

## 2018-11-24 MED ORDER — MUPIROCIN 2 % EX OINT
1.0000 "application " | TOPICAL_OINTMENT | Freq: Two times a day (BID) | CUTANEOUS | Status: DC
  Administered 2018-11-24 – 2018-11-28 (×9): 1 via NASAL
  Filled 2018-11-24 (×2): qty 22

## 2018-11-24 MED ORDER — LEVETIRACETAM IN NACL 500 MG/100ML IV SOLN
500.0000 mg | Freq: Two times a day (BID) | INTRAVENOUS | Status: DC
  Administered 2018-11-24 – 2018-11-28 (×8): 500 mg via INTRAVENOUS
  Filled 2018-11-24 (×8): qty 100

## 2018-11-24 MED ORDER — SODIUM CHLORIDE 0.9 % IV SOLN
INTRAVENOUS | Status: DC | PRN
  Administered 2018-11-24: 16:00:00 25 ug/min via INTRAVENOUS

## 2018-11-24 MED ORDER — FLEET ENEMA 7-19 GM/118ML RE ENEM: 1.0000 | ENEMA | Freq: Once | RECTAL | Status: DC | PRN

## 2018-11-24 MED ORDER — DEXTROSE 50 % IV SOLN
50.0000 mL | Freq: Once | INTRAVENOUS | Status: AC
  Administered 2018-11-24: 15:00:00 50 mL via INTRAVENOUS

## 2018-11-24 MED ORDER — LABETALOL HCL 5 MG/ML IV SOLN
INTRAVENOUS | Status: AC
  Filled 2018-11-24: qty 4

## 2018-11-24 SURGICAL SUPPLY — 72 items
BASKET BONE COLLECTION (BASKET) IMPLANT
BIT DRILL WIRE PASS 1.3MM (BIT) IMPLANT
BNDG GAUZE ELAST 4 BULKY (GAUZE/BANDAGES/DRESSINGS) IMPLANT
BNDG STRETCH 4X75 STRL LF (GAUZE/BANDAGES/DRESSINGS) IMPLANT
BUR ACORN 6.0 PRECISION (BURR) ×2 IMPLANT
BUR ACORN 6.0MM PRECISION (BURR) ×1
BUR SPIRAL ROUTER 2.3 (BUR) ×1 IMPLANT
BUR SPIRAL ROUTER 2.3MM (BUR) ×1
CANISTER SUCT 3000ML PPV (MISCELLANEOUS) ×3 IMPLANT
CARTRIDGE OIL MAESTRO DRILL (MISCELLANEOUS) ×1 IMPLANT
CATH ROBINSON RED A/P 12FR (CATHETERS) IMPLANT
CLIP VESOCCLUDE MED 6/CT (CLIP) IMPLANT
COVER WAND RF STERILE (DRAPES) ×1 IMPLANT
DECANTER SPIKE VIAL GLASS SM (MISCELLANEOUS) ×3 IMPLANT
DIFFUSER DRILL AIR PNEUMATIC (MISCELLANEOUS) ×3 IMPLANT
DRAIN CHANNEL 10M FLAT 3/4 FLT (DRAIN) IMPLANT
DRAIN JACKSON PRATT 10MM FLAT (MISCELLANEOUS) ×4 IMPLANT
DRAIN PENROSE 1/2X12 LTX STRL (WOUND CARE) IMPLANT
DRAPE NEUROLOGICAL W/INCISE (DRAPES) ×3 IMPLANT
DRAPE WARM FLUID 44X44 (DRAPES) ×3 IMPLANT
DRILL WIRE PASS 1.3MM (BIT)
DRSG OPSITE POSTOP 3X4 (GAUZE/BANDAGES/DRESSINGS) ×4 IMPLANT
DRSG PAD ABDOMINAL 8X10 ST (GAUZE/BANDAGES/DRESSINGS) IMPLANT
DURAPREP 6ML APPLICATOR 50/CS (WOUND CARE) ×3 IMPLANT
ELECT REM PT RETURN 9FT ADLT (ELECTROSURGICAL) ×3
ELECTRODE REM PT RTRN 9FT ADLT (ELECTROSURGICAL) ×1 IMPLANT
EVACUATOR SILICONE 100CC (DRAIN) ×4 IMPLANT
GAUZE 4X4 16PLY RFD (DISPOSABLE) IMPLANT
GAUZE SPONGE 4X4 12PLY STRL (GAUZE/BANDAGES/DRESSINGS) IMPLANT
GLOVE BIO SURGEON STRL SZ8 (GLOVE) ×3 IMPLANT
GLOVE BIOGEL PI IND STRL 8 (GLOVE) ×1 IMPLANT
GLOVE BIOGEL PI IND STRL 8.5 (GLOVE) ×1 IMPLANT
GLOVE BIOGEL PI INDICATOR 8 (GLOVE) ×2
GLOVE BIOGEL PI INDICATOR 8.5 (GLOVE) ×2
GLOVE ECLIPSE 8.0 STRL XLNG CF (GLOVE) ×3 IMPLANT
GLOVE EXAM NITRILE XL STR (GLOVE) IMPLANT
GOWN STRL REUS W/ TWL LRG LVL3 (GOWN DISPOSABLE) IMPLANT
GOWN STRL REUS W/ TWL XL LVL3 (GOWN DISPOSABLE) IMPLANT
GOWN STRL REUS W/TWL 2XL LVL3 (GOWN DISPOSABLE) IMPLANT
GOWN STRL REUS W/TWL LRG LVL3 (GOWN DISPOSABLE)
GOWN STRL REUS W/TWL XL LVL3 (GOWN DISPOSABLE)
HEMOSTAT POWDER KIT SURGIFOAM (HEMOSTASIS) ×2 IMPLANT
HEMOSTAT SURGICEL 2X14 (HEMOSTASIS) ×3 IMPLANT
KIT BASIN OR (CUSTOM PROCEDURE TRAY) ×3 IMPLANT
KIT TURNOVER KIT B (KITS) ×3 IMPLANT
NDL HYPO 25X1 1.5 SAFETY (NEEDLE) ×1 IMPLANT
NEEDLE HYPO 25X1 1.5 SAFETY (NEEDLE) ×3 IMPLANT
NS IRRIG 1000ML POUR BTL (IV SOLUTION) ×5 IMPLANT
OIL CARTRIDGE MAESTRO DRILL (MISCELLANEOUS) ×3
PACK CRANIOTOMY CUSTOM (CUSTOM PROCEDURE TRAY) ×3 IMPLANT
PAD ARMBOARD 7.5X6 YLW CONV (MISCELLANEOUS) ×3 IMPLANT
PATTIES SURGICAL .5 X.5 (GAUZE/BANDAGES/DRESSINGS) IMPLANT
PATTIES SURGICAL .5 X3 (DISPOSABLE) IMPLANT
PATTIES SURGICAL 1X1 (DISPOSABLE) IMPLANT
PIN MAYFIELD SKULL DISP (PIN) IMPLANT
PLATE 1.5  2HOLE LNG NEURO (Plate) ×12 IMPLANT
PLATE 1.5 2HOLE LNG NEURO (Plate) IMPLANT
SCREW SELF DRILL HT 1.5/4MM (Screw) ×24 IMPLANT
SPECIMEN JAR SMALL (MISCELLANEOUS) IMPLANT
SPONGE NEURO XRAY DETECT 1X3 (DISPOSABLE) IMPLANT
SPONGE SURGIFOAM ABS GEL 100 (HEMOSTASIS) ×2 IMPLANT
STAPLER SKIN PROX WIDE 3.9 (STAPLE) ×3 IMPLANT
SUT ETHILON 3 0 PS 1 (SUTURE) IMPLANT
SUT NURALON 4 0 TR CR/8 (SUTURE) ×4 IMPLANT
SUT VIC AB 2-0 CP2 18 (SUTURE) ×6 IMPLANT
SUT VIC AB 3-0 SH 8-18 (SUTURE) ×2 IMPLANT
SYR CONTROL 10ML LL (SYRINGE) IMPLANT
TOWEL GREEN STERILE (TOWEL DISPOSABLE) ×3 IMPLANT
TOWEL GREEN STERILE FF (TOWEL DISPOSABLE) ×3 IMPLANT
TRAY FOLEY MTR SLVR 16FR STAT (SET/KITS/TRAYS/PACK) ×2 IMPLANT
UNDERPAD 30X30 (UNDERPADS AND DIAPERS) IMPLANT
WATER STERILE IRR 1000ML POUR (IV SOLUTION) ×3 IMPLANT

## 2018-11-24 NOTE — Progress Notes (Signed)
PROGRESS NOTE    Willie [name]  QQP:[id number] DOB: 07/29/1975 DOA: 11/22/2018 PCP: Debbrah Alar, NP    Brief Narrative:  43 year old male who presented with abdominal pain and headaches.  He does have significant past medical history for hypertension, dyslipidemia, type 2 diabetes mellitus, GERD, depression, anxiety and history of alcohol abuse.  He was diagnosed with subdural hematoma November 14, 2018, he was recommended evacuation but he left the hospital (in [facility] [place].  He had persistent symptoms of headache, nausea, and vomiting.  On his initial physical examination blood pressure 161/109, heart rate 100, respiratory rate 18, temperature 98.1, oxygen saturation 98%.  His lungs are clear to auscultation bilaterally, heart S1-S2 present rhythm, the abdomen was tender in the midline, no lower extremity edema.  Patient was awake and alert, nonfocal. Sodium 137, potassium 4.0, chloride 96, bicarb 23, glucose 233, BUN less than 5, creatinine 0.87, AST 152, ALT 70,  lipase 16, white count 4.7, hemoglobin 12.4, hematocrit 37.4, platelets 433.  SARS COVID-19 negative.  Head CT with left frontoparietal subdural hematoma measuring 14 mm thick, exerting mass-effect upon the left hemisphere.  Right frontoparietal subdural hematoma up to 8 mm thick with mild mass-effect of the right hemisphere.  EKG 111 bpm, normal axis, QTC 500, sinus rhythm, no ST segment or T wave changes.  Positive PVC.  Patient was admitted to the hospital working diagnosis of bilateral subdural hematoma, with intracranial hypertension  Assessment & Plan:   Principal Problem:   SDH (subdural hematoma) (HCC) Active Problems:   Diabetes type 2, uncontrolled (HCC)   Essential hypertension   Depression with anxiety   Chronic pancreatitis (HCC)   Alcohol abuse   GERD (gastroesophageal reflux disease)   Abdominal pain   Abnormal LFTs   1. Bilateral subdural hematoma/ traumatic. Continue to  have dull type headache. Today plan for bilateral craniotomies. Continue neuro checks per unit protocol.   2. T2DM. Positive hypoglycemia this am, will now discontinue long acting insulin and will continue glucose cover and monitoring with insulin sliding scale.   3. HTN with prolonged Qtc. Blood pressure today 142/92, continue to hold on IV fluids and antihypertensive agents. Echocardiogram with mild reduction in LV systolic function 45 to 71% with inferior and inferior septal hypokinesis. Keep K at 4 and Mg at 2.   4. Chronic alcohol abuse. Persistent anxiety symptoms this am, patient has received a total of 5 mg over last 24 H of lorazepam, will add an extra dose this am of 5 mg. Will continue CIWA protocol.  5. GERD. On bid pantoprazole.   6. Depression. On quetiapine and escitalopram, plus as needed hydroxyzine.   7. New hypokalemia. Will continue K correction with Kcl, 80 meq today in 2 divided doses and follow up with Mg.   DVT prophylaxis: scd   Code Status: full Family Communication: no family at the bedside  Disposition Plan/ discharge barriers: pending OR in am.   Body mass index is 22.78 kg/m. Malnutrition Type:      Malnutrition Characteristics:      Nutrition Interventions:     RN Pressure Injury Documentation:     Consultants:   Neurosurgery   Procedures:     Antimicrobials:       Subjective: Patient had episode of hypoglycemia this am. He reports positive anxiety this am, moderate to severe, has not improved with lorazepam 1 mg, associated with headache, no nausea or vomiting, no worsening factors.   Objective: Vitals:   11/23/18 2037  11/23/18 2319 11/24/18 0342 11/24/18 0811  BP: (!) 143/104 119/81 134/86 139/83  Pulse: 100 (!) 118 95 88  Resp: (!) 24 17 18 14   Temp: 99 F (37.2 C) 98.2 F (36.8 C) 98.1 F (36.7 C) 98.5 F (36.9 C)  TempSrc: Oral Oral Oral Oral  SpO2: 100% 100% 98% 99%  Weight:      Height:         Intake/Output Summary (Last 24 hours) at 11/24/2018 0827 Last data filed at 11/23/2018 1803 Gross per 24 hour  Intake 118 ml  Output 1200 ml  Net -1082 ml   Filed Weights   11/22/18 1614 11/22/18 2333  Weight: 61.2 kg 76.2 kg    Examination:   General: deconditioned  Neurology: Awake and alert, non focal, no tremors but positive visual anxiety.   E ENT: positive pallor, no icterus, oral mucosa moist Cardiovascular: No JVD. S1-S2 present, rhythmic, no gallops, rubs, or murmurs. No lower extremity edema. Pulmonary: positive breath sounds bilaterally, adequate air movement, no wheezing, rhonchi or rales. Gastrointestinal. Abdomen with no organomegaly, non tender, no rebound or guarding Skin. No rashes Musculoskeletal: no joint deformities     Data Reviewed: I have personally reviewed following labs and imaging studies  CBC: Recent Labs  Lab 11/22/18 1730 11/23/18 0045 11/24/18 0327  WBC 4.7 5.2 5.2  NEUTROABS 3.7  --  2.4  HGB 12.4* 11.8* 10.6*  HCT 37.4* 34.4* 32.4*  MCV 90.3 89.1 93.1  PLT 433* 387 106   Basic Metabolic Panel: Recent Labs  Lab 11/22/18 1730 11/23/18 0045 11/24/18 0327  NA 137 136 141  K 4.0 3.5 2.9*  CL 96* 98 107  CO2 23 26 26   GLUCOSE 233* 185* 57*  BUN <5* <5* <5*  CREATININE 0.87 0.86 0.75  CALCIUM 8.2* 8.2* 8.2*   GFR: Estimated Creatinine Clearance: 129.6 mL/min (by C-G formula based on SCr of 0.75 mg/dL). Liver Function Tests: Recent Labs  Lab 11/22/18 1730  AST 152*  ALT 70*  ALKPHOS 160*  BILITOT 1.1  PROT 6.4*  ALBUMIN 3.0*   Recent Labs  Lab 11/22/18 1730  LIPASE 16   No results for input(s): AMMONIA in the last 168 hours. Coagulation Profile: Recent Labs  Lab 11/22/18 1730  INR 1.0   Cardiac Enzymes: No results for input(s): CKTOTAL, CKMB, CKMBINDEX, TROPONINI in the last 168 hours. BNP (last 3 results) No results for input(s): PROBNP in the last 8760 hours. HbA1C: Recent Labs    11/23/18 0045  HGBA1C  6.9*   CBG: Recent Labs  Lab 11/23/18 1128 11/23/18 1638 11/23/18 2001 11/24/18 0634 11/24/18 0651  GLUCAP 201* 110* 282* 41* 74   Lipid Profile: No results for input(s): CHOL, HDL, LDLCALC, TRIG, CHOLHDL, LDLDIRECT in the last 72 hours. Thyroid Function Tests: No results for input(s): TSH, T4TOTAL, FREET4, T3FREE, THYROIDAB in the last 72 hours. Anemia Panel: No results for input(s): VITAMINB12, FOLATE, FERRITIN, TIBC, IRON, RETICCTPCT in the last 72 hours.    Radiology Studies: I have reviewed all of the imaging during this hospital visit personally     Scheduled Meds: . Chlorhexidine Gluconate Cloth  6 each Topical Once  . escitalopram  20 mg Oral Daily  . insulin aspart  0-9 Units Subcutaneous TID WC  . insulin glargine  14 Units Subcutaneous QHS  . LORazepam  0-4 mg Intravenous Q6H   Or  . LORazepam  0-4 mg Oral Q6H  . [START ON 11/25/2018] LORazepam  0-4 mg Intravenous Q12H  Or  . [START ON 11/25/2018] LORazepam  0-4 mg Oral Q12H  . multivitamin with minerals  1 tablet Oral Daily  . mupirocin ointment  1 application Nasal BID  . pantoprazole  40 mg Oral BID  . QUEtiapine  300 mg Oral QHS  . thiamine  100 mg Oral Daily   Or  . thiamine  100 mg Intravenous Daily   Continuous Infusions:   LOS: 2 days        Leanza Shepperson Gerome Apley, MD

## 2018-11-24 NOTE — Progress Notes (Signed)
Initial Nutrition Assessment  DOCUMENTATION CODES:   Not applicable  INTERVENTION:  Once diet advances, provide Ensure Enlive po BID, each supplement provides 350 kcal and 20 grams of protein  NUTRITION DIAGNOSIS:   Increased nutrient needs related to post-op healing as evidenced by estimated needs.  GOAL:   Patient will meet greater than or equal to 90% of their needs  MONITOR:   Supplement acceptance, Diet advancement, Skin, Weight trends, Labs, I & O's  REASON FOR ASSESSMENT:   Malnutrition Screening Tool    ASSESSMENT:   43 y.o. male with history HTN, DM, hyperlipidemia, alcohol abuse, chronic alcoholic pancreatis who presents with worsening nausea with vomiting and headache. Pt with previous diagnosed with left > right bilateral subdural hematomas. Head CT revealed possible slight worsening in bilateral subdural hematomas  Pt currently unavailable, in OR for bilateral craniotomies for SDH. RD unable to obtain pt nutrition history at this time. Meal completion 100% prior to NPO status today. Noted pt with a 10.5% weight loss in 2 months per weight records. Weight loss significant for time frame. RD to order nutritional supplements to aid in caloric and protein needs as well as in healing.   Unable to complete Nutrition-Focused physical exam at this time.   Labs and medications reviewed.   Diet Order:   Diet Order            Diet NPO time specified  Diet effective midnight              EDUCATION NEEDS:   Not appropriate for education at this time  Skin:  Skin Assessment: Reviewed RN Assessment  Last BM:  7/27  Height:   Ht Readings from Last 1 Encounters:  11/24/18 6' 0.01" (1.829 m)    Weight:   Wt Readings from Last 1 Encounters:  11/24/18 76.2 kg    Ideal Body Weight:  80.9 kg  BMI:  Body mass index is 22.78 kg/m.  Estimated Nutritional Needs:   Kcal:  2050-2250  Protein:  105-120 grams  Fluid:  >/= 2 L/day    Corrin Parker, MS,  RD, LDN Pager # [phone] After hours/ weekend pager # [phone]

## 2018-11-24 NOTE — Interval H&P Note (Signed)
History and Physical Interval Note:  11/24/2018 7:30 AM  Willie [name]  has presented today for surgery, with the diagnosis of Subdural hematoma.  The various methods of treatment have been discussed with the patient and family. After consideration of risks, benefits and other options for treatment, the patient has consented to  Procedure(s) with comments: Bilateral craniotomy for evacuation of subdural hematoma (Bilateral) - Bilateral craniotomy for evacuation of subdural hematoma as a surgical intervention.  The patient's history has been reviewed, patient examined, no change in status, stable for surgery.  I have reviewed the patient's chart and labs.  Questions were answered to the patient's satisfaction.     Peggyann Shoals

## 2018-11-24 NOTE — Anesthesia Procedure Notes (Signed)
Procedure Name: Intubation Date/Time: 11/24/2018 4:11 PM Performed by: Teressa Lower., CRNA Pre-anesthesia Checklist: Patient identified, Emergency Drugs available, Suction available and Patient being monitored Patient Re-evaluated:Patient Re-evaluated prior to induction Oxygen Delivery Method: Circle system utilized Preoxygenation: Pre-oxygenation with 100% oxygen Induction Type: IV induction Ventilation: Mask ventilation without difficulty Laryngoscope Size: Mac and 4 Grade View: Grade II Tube type: Oral Tube size: 7.5 mm Number of attempts: 1 Airway Equipment and Method: Stylet Placement Confirmation: ETT inserted through vocal cords under direct vision,  positive ETCO2 and breath sounds checked- equal and bilateral Secured at: 23 cm Tube secured with: Tape Dental Injury: Teeth and Oropharynx as per pre-operative assessment

## 2018-11-24 NOTE — Progress Notes (Signed)
Hypoglycemic Event  CBG: 41  Treatment: 25 amp Dextrose  Symptoms: shaking  Follow-up CBG: Time: 6:51am CBG Result:74  Possible Reasons for Event: NPO for surgery Comments/MD notified: paged triad on call asked for d5 fluids   Willie [name] L

## 2018-11-24 NOTE — Progress Notes (Signed)
Hypoglycemic Event  CBG: 42  Treatment: D50 50 mL (25 gm)  Symptoms: None  Follow-up CBG: Time: 1510 CBG Result: 121  Possible Reasons for Event: Inadequate meal intake  Comments/MD notified: Dr. Ola Spurr, and Franchot Mimes, CRNA aware.   Will continue to monitor    Joyelle Siedlecki R

## 2018-11-24 NOTE — Progress Notes (Signed)
Inpatient Diabetes Program Recommendations  AACE/ADA: New Consensus Statement on Inpatient Glycemic Control (2015)  Target Ranges:  Prepandial:   less than 140 mg/dL      Peak postprandial:   less than 180 mg/dL (1-2 hours)      Critically ill patients:  140 - 180 mg/dL   Results for Willie [name], Willie [name] (MRN [id number]) as of 11/24/2018 09:49  Ref. Range 11/23/2018 07:43 11/23/2018 08:52 11/23/2018 11:28 11/23/2018 16:38 11/23/2018 20:01 11/24/2018 06:34 11/24/2018 06:51  Glucose-Capillary Latest Ref Range: 70 - 99 mg/dL 52 (L) 91 201 (H) 110 (H) 282 (H) 41 (LL) 74    Review of Glycemic Control  Diabetes history: DM 2 Outpatient Diabetes medications: Lantus 20 units, Humalog 5-15 units tid (5 units breakfast, 15 units lunch, 10 units supper), Freestyle Libre 14 days sensor Current orders for Inpatient glycemic control: Novolog 0-9 units tid  A1c 6.9 % on 7/27  Inpatient Diabetes Program Recommendations:    Hypoglycemia past 2 mornings. Noted Lantus 14 units d/c'd. Patient NPO for procedure this am. Renal function ok  Consider Novolog 0-15 units Q4 hours.  Thanks,  Tama Headings RN, MSN, BC-ADM Inpatient Diabetes Coordinator Team Pager [phone] (8a-5p)

## 2018-11-24 NOTE — Anesthesia Preprocedure Evaluation (Addendum)
Anesthesia Evaluation  Patient identified by MRN, date of birth, ID band Patient awake    Reviewed: Allergy & Precautions, H&P , NPO status , Patient's Chart, lab work & pertinent test results  Airway Mallampati: II  TM Distance: >3 FB Neck ROM: Full    Dental no notable dental hx. (+) Teeth Intact, Dental Advisory Given   Pulmonary sleep apnea , former smoker,    Pulmonary exam normal breath sounds clear to auscultation       Cardiovascular hypertension, Pt. on medications  Rhythm:Regular Rate:Normal     Neuro/Psych Anxiety Depression negative neurological ROS     GI/Hepatic Neg liver ROS, GERD  Controlled,(+)     substance abuse  alcohol use,   Endo/Other  diabetes, Insulin Dependent  Renal/GU negative Renal ROS  negative genitourinary   Musculoskeletal   Abdominal   Peds  Hematology  (+) Blood dyscrasia, anemia ,   Anesthesia Other Findings   Reproductive/Obstetrics negative OB ROS                            Anesthesia Physical Anesthesia Plan  ASA: III  Anesthesia Plan: General   Post-op Pain Management:    Induction: Intravenous  PONV Risk Score and Plan: 3 and Ondansetron and Treatment may vary due to age or medical condition  Airway Management Planned: Oral ETT  Additional Equipment: Arterial line  Intra-op Plan:   Post-operative Plan: Extubation in OR  Informed Consent: I have reviewed the patients History and Physical, chart, labs and discussed the procedure including the risks, benefits and alternatives for the proposed anesthesia with the patient or authorized representative who has indicated his/her understanding and acceptance.     Dental advisory given  Plan Discussed with: CRNA  Anesthesia Plan Comments:         Anesthesia Quick Evaluation

## 2018-11-24 NOTE — Progress Notes (Signed)
Subjective: Patient reports headache is worse today  Objective: Vital signs in last 24 hours: Temp:  [97.9 F (36.6 C)-99 F (37.2 C)] 97.9 F (36.6 C) (07/28 0832) Pulse Rate:  [82-118] 88 (07/28 0811) Resp:  [14-24] 14 (07/28 0811) BP: (115-154)/(77-104) 154/77 (07/28 0832) SpO2:  [98 %-100 %] 99 % (07/28 0811)  Intake/Output from previous day: 07/27 0701 - 07/28 0700 In: 118 [P.O.:118] Out: 1200 [Urine:1200] Intake/Output this shift: No intake/output data recorded.  Physical Exam: Awake, alert, conversant.  MAEW.  Persistent right drift.  Lab Results: Recent Labs    11/23/18 0045 11/24/18 0327  WBC 5.2 5.2  HGB 11.8* 10.6*  HCT 34.4* 32.4*  PLT 387 346   BMET Recent Labs    11/23/18 0045 11/24/18 0327  NA 136 141  K 3.5 2.9*  CL 98 107  CO2 26 26  GLUCOSE 185* 57*  BUN <5* <5*  CREATININE 0.86 0.75  CALCIUM 8.2* 8.2*    Studies/Results: Ct Head Wo Contrast  Result Date: 11/22/2018 CLINICAL DATA:  Posttraumatic headache, nausea and vomiting, LEFT side head pain; just got back from [facility], fell [place] a [place], went to hospital and was told he we had a blood clot in his brain, unclear about when he actually fell and when he arrived home EXAM: CT HEAD WITHOUT CONTRAST TECHNIQUE: Contiguous axial images were obtained from the base of the skull through the vertex without intravenous contrast. Sagittal and coronal MPR images reconstructed from axial data set. COMPARISON:  06/07/2017 FINDINGS: Brain: Normal ventricular morphology. Intermediate attenuation LEFT frontoparietal subdural hematoma measuring up to 14 mm thick. Mass effect upon the lateral aspect of the RIGHT hemisphere. Additionally, older appearing intermediate to low attenuation RIGHT frontoparietal subdural hematoma measuring up to 8 mm thick with mild mass effect upon the RIGHT hemisphere. No intraparenchymal hemorrhage, evidence acute infarct, or mass. 1 mm of LEFT-to-RIGHT midline shift. Vascular:  Unremarkable Skull: Intact Sinuses/Orbits: Clear Other: N/A IMPRESSION: Intermediate attenuation LEFT frontoparietal subdural hematoma measuring 14 mm thick, exerting mass effect upon the LEFT hemisphere. Intermediate to low attenuation RIGHT frontal parietal subdural hematoma up to 8 mm thick with mild mass effect upon RIGHT hemisphere. No intraparenchymal hemorrhage or infarction. 1 mm LEFT-to-RIGHT midline shift. Critical Value/emergent results were called by telephone at the time of interpretation on 11/22/2018 at 5:31 pm to Dr. Julianne Rice , who verbally acknowledged these results. Electronically Signed   By: Lavonia Dana M.D.   On: 11/22/2018 17:31    Assessment/Plan: Plan OR this afternoon for bilateral craniotomies for SDH.    LOS: 2 days    Peggyann Shoals, MD 11/24/2018, 10:08 AM

## 2018-11-24 NOTE — Anesthesia Procedure Notes (Signed)
Arterial Line Insertion Start/End7/28/2020 2:37 PM, 11/24/2018 2:47 PM Performed by: Teressa Lower., CRNA, CRNA  Patient location: Pre-op. Preanesthetic checklist: patient identified, IV checked, site marked, risks and benefits discussed, surgical consent, monitors and equipment checked, pre-op evaluation, timeout performed and anesthesia consent Lidocaine 1% used for infiltration Left, radial was placed Catheter size: 20 G Hand hygiene performed , maximum sterile barriers used  and Seldinger technique used Allen's test indicative of satisfactory collateral circulation Attempts: 1 Procedure performed without using ultrasound guided technique. Following insertion, dressing applied and Biopatch. Post procedure assessment: normal and unchanged  Patient tolerated the procedure well with no immediate complications.

## 2018-11-24 NOTE — Transfer of Care (Signed)
Immediate Anesthesia Transfer of Care Note  Patient: Willie [name]  Procedure(s) Performed: Bilateral craniotomy for evacuation of subdural hematoma (Bilateral )  Patient Location: PACU  Anesthesia Type:General  Level of Consciousness: awake, alert  and oriented  Airway & Oxygen Therapy: Patient Spontanous Breathing  Post-op Assessment: Report given to RN and Post -op Vital signs reviewed and stable  Post vital signs: Reviewed and stable  Last Vitals:  Vitals Value Taken Time  BP 160/144 11/24/18 1713  Temp    Pulse 118 11/24/18 1713  Resp 17 11/24/18 1713  SpO2 99 % 11/24/18 1713  Vitals shown include unvalidated device data.  Last Pain:  Vitals:   11/24/18 1153  TempSrc: Oral  PainSc:          Complications: No apparent anesthesia complications

## 2018-11-24 NOTE — Op Note (Signed)
11/24/2018  5:17 PM  PATIENT:  Willie [name]  43 y.o. male  PRE-OPERATIVE DIAGNOSIS:  Bilateral Subdural hematoma  POST-OPERATIVE DIAGNOSIS:  Bilateral Subdural hematoma  PROCEDURE:  Procedure(s) with comments: Bilateral craniotomy for evacuation of subdural hematoma (Bilateral) - Bilateral craniotomy for evacuation of subdural hematoma  SURGEON:  Surgeon(s) and Role:    Erline Levine, MD - Primary  PHYSICIAN ASSISTANT:   ASSISTANTS: Poteat, RN   ANESTHESIA:   general  EBL:  50 mL   BLOOD ADMINISTERED:none  DRAINS: (2x#10) Jackson-Pratt drain(s) with closed bulb suction in the subdural space   LOCAL MEDICATIONS USED:  MARCAINE    and LIDOCAINE   SPECIMEN:  No Specimen  DISPOSITION OF SPECIMEN:  N/A  COUNTS:  YES  TOURNIQUET:  * No tourniquets in log *  DICTATION: Patient is 43 year old man who has developed bilateral subdural hematomas. He has had progressively worsening headache and CT shows bilateral subdural hematoma with mass effect. It was elected to take patient to surgery for bilateral craniotomies for SDH.  Procedure: Following smooth intubation, patient was placed in brow up position. Head was placed on donut head holder and bi-frontal scalp was shaved and prepped and draped in usual sterile fashion. Area of planned incision was infiltrated with lidocaine. A ilinear incision was made and carried through temporalis fascia and muscle to expose calvarium on each side of his head. Skull flaps was elevated exposing subdural hematoma. Dura was opened and subdural was evacuated. The subdural was larger and under greater pressure on the left. Both subdural cavities were irrigated with saline until significantly clearer. Subdural membranes were opened on each side. Hemostasis was assured. The brain was considerably more relaxed after hematoma evacuation. Bilateral # 10 JP drains were placed and anchored with vicryl sutures. Bone flaps was replaced with plates, the fascia  and galea were closed with 2-0 vicryl sutures and the skin was re approximated with staples. Sterile occlusive dressings were placed. Patient was returned to a supine position and transferred to PACU in stable and satisfactory condition. Counts were correct at the end of the case.   PLAN OF CARE: Admit to inpatient   PATIENT DISPOSITION:  PACU - hemodynamically stable.   Delay start of Pharmacological VTE agent (>24hrs) due to surgical blood loss or risk of bleeding: yes

## 2018-11-24 NOTE — Brief Op Note (Signed)
11/24/2018  5:17 PM  PATIENT:  Willie [name]  43 y.o. male  PRE-OPERATIVE DIAGNOSIS:  Bilateral Subdural hematoma  POST-OPERATIVE DIAGNOSIS:  Bilateral Subdural hematoma  PROCEDURE:  Procedure(s) with comments: Bilateral craniotomy for evacuation of subdural hematoma (Bilateral) - Bilateral craniotomy for evacuation of subdural hematoma  SURGEON:  Surgeon(s) and Role:    Erline Levine, MD - Primary  PHYSICIAN ASSISTANT:   ASSISTANTS: Poteat, RN   ANESTHESIA:   general  EBL:  50 mL   BLOOD ADMINISTERED:none  DRAINS: (2x#10) Jackson-Pratt drain(s) with closed bulb suction in the subdural space   LOCAL MEDICATIONS USED:  MARCAINE    and LIDOCAINE   SPECIMEN:  No Specimen  DISPOSITION OF SPECIMEN:  N/A  COUNTS:  YES  TOURNIQUET:  * No tourniquets in log *  DICTATION: Patient is 43 year old man who has developed bilateral subdural hematomas. He has had progressively worsening headache and CT shows bilateral subdural hematoma with mass effect. It was elected to take patient to surgery for bilateral craniotomies for SDH.  Procedure: Following smooth intubation, patient was placed in brow up position. Head was placed on donut head holder and bi-frontal scalp was shaved and prepped and draped in usual sterile fashion. Area of planned incision was infiltrated with lidocaine. A ilinear incision was made and carried through temporalis fascia and muscle to expose calvarium on each side of his head. Skull flaps was elevated exposing subdural hematoma. Dura was opened and subdural was evacuated. The subdural was larger and under greater pressure on the left. Both subdural cavities were irrigated with saline until significantly clearer. Subdural membranes were opened on each side. Hemostasis was assured. The brain was considerably more relaxed after hematoma evacuation. Bilateral # 10 JP drains were placed and anchored with vicryl sutures. Bone flaps was replaced with plates, the fascia  and galea were closed with 2-0 vicryl sutures and the skin was re approximated with staples. Sterile occlusive dressings were placed. Patient was returned to a supine position and transferred to PACU in stable and satisfactory condition. Counts were correct at the end of the case.   PLAN OF CARE: Admit to inpatient   PATIENT DISPOSITION:  PACU - hemodynamically stable.   Delay start of Pharmacological VTE agent (>24hrs) due to surgical blood loss or risk of bleeding: yes

## 2018-11-25 ENCOUNTER — Encounter (HOSPITAL_COMMUNITY): Payer: Self-pay | Admitting: Neurosurgery

## 2018-11-25 DIAGNOSIS — I6200 Nontraumatic subdural hemorrhage, unspecified: Secondary | ICD-10-CM

## 2018-11-25 LAB — GLUCOSE, CAPILLARY
Glucose-Capillary: 231 mg/dL — ABNORMAL HIGH (ref 70–99)
Glucose-Capillary: 217 mg/dL — ABNORMAL HIGH (ref 70–99)
Glucose-Capillary: 134 mg/dL — ABNORMAL HIGH (ref 70–99)
Glucose-Capillary: 200 mg/dL — ABNORMAL HIGH (ref 70–99)

## 2018-11-25 LAB — BASIC METABOLIC PANEL
Anion gap: 10 (ref 5–15)
BUN: <5 mg/dL — ABNORMAL LOW (ref 6–20)
CO2: 24 mmol/L (ref 22–32)
Calcium: 8.2 mg/dL — ABNORMAL LOW (ref 8.9–10.3)
Chloride: 104 mmol/L (ref 98–111)
Creatinine, Ser: 0.66 mg/dL (ref 0.61–1.24)
GFR calc Af Amer: >60 mL/min (ref >60)
GFR calc non Af Amer: >60 mL/min (ref >60)
Glucose, Bld: 206 mg/dL — ABNORMAL HIGH (ref 70–99)
Potassium: 3.4 mmol/L — ABNORMAL LOW (ref 3.5–5.1)
Sodium: 138 mmol/L (ref 135–145)

## 2018-11-25 LAB — MAGNESIUM: Magnesium: 1.6 mg/dL — ABNORMAL LOW (ref 1.7–2.4)

## 2018-11-25 MED ORDER — FOLIC ACID 1 MG PO TABS
1.0000 mg | ORAL_TABLET | Freq: Every day | ORAL | Status: DC
  Administered 2018-11-25 – 2018-11-28 (×4): 1 mg via ORAL
  Filled 2018-11-25 (×4): qty 1

## 2018-11-25 MED ORDER — ADULT MULTIVITAMIN W/MINERALS CH
1.0000 | ORAL_TABLET | Freq: Every day | ORAL | Status: DC
  Administered 2018-11-26 – 2018-11-28 (×3): 1 via ORAL
  Filled 2018-11-25 (×3): qty 1

## 2018-11-25 MED ORDER — INSULIN GLARGINE 100 UNIT/ML ~~LOC~~ SOLN
10.0000 [IU] | Freq: Every day | SUBCUTANEOUS | Status: DC
  Administered 2018-11-25 – 2018-11-27 (×3): 10 [IU] via SUBCUTANEOUS
  Filled 2018-11-25 (×3): qty 0.1

## 2018-11-25 MED ORDER — SODIUM CHLORIDE 0.9 % IV SOLN
INTRAVENOUS | Status: DC
  Administered 2018-11-25 – 2018-11-26 (×2): via INTRAVENOUS

## 2018-11-25 MED ORDER — VITAMIN B-1 100 MG PO TABS
100.0000 mg | ORAL_TABLET | Freq: Every day | ORAL | Status: DC
  Administered 2018-11-26 – 2018-11-28 (×3): 100 mg via ORAL
  Filled 2018-11-25 (×3): qty 1

## 2018-11-25 MED ORDER — LORAZEPAM 2 MG/ML IJ SOLN
1.0000 mg | Freq: Four times a day (QID) | INTRAMUSCULAR | Status: DC | PRN
  Administered 2018-11-26 (×2): 1 mg via INTRAVENOUS
  Filled 2018-11-25 (×2): qty 1

## 2018-11-25 MED ORDER — MAGNESIUM SULFATE 2 GM/50ML IV SOLN
2.0000 g | Freq: Once | INTRAVENOUS | Status: AC
  Administered 2018-11-25: 2 g via INTRAVENOUS
  Filled 2018-11-25: qty 50

## 2018-11-25 MED ORDER — LORAZEPAM 1 MG PO TABS
1.0000 mg | ORAL_TABLET | Freq: Four times a day (QID) | ORAL | Status: DC | PRN
  Administered 2018-11-26 (×2): 1 mg via ORAL
  Filled 2018-11-25 (×2): qty 1

## 2018-11-25 MED ORDER — THIAMINE HCL 100 MG/ML IJ SOLN
100.0000 mg | Freq: Every day | INTRAMUSCULAR | Status: DC
  Filled 2018-11-25: qty 2

## 2018-11-25 MED ORDER — POTASSIUM CHLORIDE CRYS ER 20 MEQ PO TBCR
40.0000 meq | EXTENDED_RELEASE_TABLET | ORAL | Status: AC
  Administered 2018-11-25 (×2): 40 meq via ORAL
  Filled 2018-11-25 (×2): qty 2

## 2018-11-25 NOTE — Progress Notes (Signed)
A-line now positional and without good waveform. Will go by blood pressure cuff for blood pressure readings.

## 2018-11-25 NOTE — Progress Notes (Signed)
PROGRESS NOTE    Willie [name]  QMV:[id number] DOB: 01-25-1976 DOA: 11/22/2018 PCP: Debbrah Alar, NP    Brief Narrative:  43 year old male who presented with abdominal pain and headaches. He does have significant past medical history for hypertension, dyslipidemia, type 2 diabetes mellitus, GERD, depression, anxiety and history of alcohol abuse. He was diagnosed with subdural hematoma November 14, 2018, he was recommended evacuation but he left the hospital (in [place].He had persistent symptoms of headache, nausea, and vomiting. On his initial physical examination blood pressure 161/109, heart rate 100, respiratory rate 18, temperature 98.1, oxygen saturation 98%. His lungs are clear to auscultation bilaterally, heart S1-S2 present rhythm, the abdomen was tender in the midline, no lower extremity edema. Patient was awake and alert, nonfocal. Sodium 137, potassium 4.0, chloride 96, bicarb 23, glucose 233, BUN less than 5, creatinine 0.87, AST 152, ALT 70,lipase 16, white count 4.7, hemoglobin 12.4, hematocrit 37.4, platelets 433.SARS COVID-19 negative.  Head CT with left frontoparietal subdural hematoma measuring 14 mm thick, exerting mass-effect upon the left hemisphere. Right frontoparietal subdural hematoma up to 8 mm thick with mild mass-effect of the right hemisphere.  EKG 111 bpm, normal axis, QTC 500, sinus rhythm, no ST segment or T wave changes. Positive PVC.  Patient was admitted to the hospital working diagnosis of bilateral subdural hematoma,with intracranial hypertension  No patient is sp bilateral craniotomy for evacuation of subdural hematoma (Bilateral) - Bilateral craniotomy for evacuation of subdural hematoma. 11/24/18.   Assessment & Plan:   Principal Problem:   SDH (subdural hematoma) (HCC) Active Problems:   Diabetes type 2, uncontrolled (HCC)   Essential hypertension   Depression with anxiety   Chronic pancreatitis  (HCC)   Alcohol abuse   GERD (gastroesophageal reflux disease)   Abdominal pain   Abnormal LFTs   Subdural bleeding ([place])   1. Bilateral subdural hematoma/ traumatic. patient now post op, bilateral craniotomy for subdural hematoma evacuation. Continue pain control per surgical team.   2. T2DM. Patient tolerating po well, his capillary glucose has reached 231 and 217, will resume basal insulin with 10 units of glargine and continue insulin sliding scale for glucose coverage and monitoring.   3. HTN with prolonged Qtc/ hypokalemia and hypomagnesemia.  Echocardiogram with mild reduction in LV systolic function 45 to 28% with inferior and inferior septal hypokinesis. Continue telemetry monitoring. K at 3,4 and Mg at 1,6, will continue electrolyte correction with Kcl and Mg sulfate, target K at 4 and Mg at 2. Continue blood pressure monitoring, today 131/86 mmHg.   4. Chronic alcohol abuse. Patient continue to have withdrawal symptoms, will continue as needed benzodiazepines per CIWA protocol.  5. GERD. Continue with pantoprazole.   6. Depression. Continue with quetiapine and escitalopram. As needed hydroxyzine.     DVT prophylaxis:scd Code Status:full Family Communication:no family at the bedside Disposition Plan/ discharge barriers:pending clinical improvement.   Body mass index is 22.78 kg/m. Malnutrition Type:  Nutrition Problem: Increased nutrient needs Etiology: post-op healing   Malnutrition Characteristics:  Signs/Symptoms: estimated needs   Nutrition Interventions:  Interventions: Ensure Enlive (each supplement provides 350kcal and 20 grams of protein)  RN Pressure Injury Documentation:      Subjective: Patient continue to have anxiety and tremors, no nausea or vomiting, tolerating po well. Continue to have headache. No chest pain or dyspnea.   Objective: Vitals:   11/25/18 0530 11/25/18 0600 11/25/18 0700 11/25/18 0800  BP: (!) 145/103 104/69  122/85   Pulse: (!) 115 90 (!)  104   Resp: (!) 23 20 13    Temp:    98.2 F (36.8 C)  TempSrc:    Oral  SpO2: 100% 100% 100%   Weight:      Height:        Intake/Output Summary (Last 24 hours) at 11/25/2018 1025 Last data filed at 11/25/2018 0745 Gross per 24 hour  Intake 1731.6 ml  Output 4715 ml  Net -2983.4 ml   Filed Weights   11/22/18 1614 11/22/18 2333 11/24/18 1357  Weight: 61.2 kg 76.2 kg 76.2 kg    Examination:   General: deconditioned  Neurology: Awake and alert, non focal/ positive tremors.   E ENT: mild pallor, no icterus, oral mucosa moist Cardiovascular: No JVD. S1-S2 present, rhythmic, no gallops, rubs, or murmurs. No lower extremity edema. Pulmonary: Positive breath sounds bilaterally, adequate air movement, no wheezing, rhonchi or rales. Gastrointestinal. Abdomen with no organomegaly, non tender, no rebound or guarding Skin. No rashes Musculoskeletal: no joint deformities/ bilateral cranial surgical wounds in place with drains.      Data Reviewed: I have personally reviewed following labs and imaging studies  CBC: Recent Labs  Lab 11/22/18 1730 11/23/18 0045 11/24/18 0327  WBC 4.7 5.2 5.2  NEUTROABS 3.7  --  2.4  HGB 12.4* 11.8* 10.6*  HCT 37.4* 34.4* 32.4*  MCV 90.3 89.1 93.1  PLT 433* 387 834   Basic Metabolic Panel: Recent Labs  Lab 11/22/18 1730 11/23/18 0045 11/24/18 0327 11/25/18 0155  NA 137 136 141 138  K 4.0 3.5 2.9* 3.4*  CL 96* 98 107 104  CO2 23 26 26 24   GLUCOSE 233* 185* 57* 206*  BUN <5* <5* <5* <5*  CREATININE 0.87 0.86 0.75 0.66  CALCIUM 8.2* 8.2* 8.2* 8.2*  MG  --   --   --  1.6*   GFR: Estimated Creatinine Clearance: 129.6 mL/min (by C-G formula based on SCr of 0.66 mg/dL). Liver Function Tests: Recent Labs  Lab 11/22/18 1730  AST 152*  ALT 70*  ALKPHOS 160*  BILITOT 1.1  PROT 6.4*  ALBUMIN 3.0*   Recent Labs  Lab 11/22/18 1730  LIPASE 16   No results for input(s): AMMONIA in the last 168 hours.  Coagulation Profile: Recent Labs  Lab 11/22/18 1730  INR 1.0   Cardiac Enzymes: No results for input(s): CKTOTAL, CKMB, CKMBINDEX, TROPONINI in the last 168 hours. BNP (last 3 results) No results for input(s): PROBNP in the last 8760 hours. HbA1C: Recent Labs    11/23/18 0045  HGBA1C 6.9*   CBG: Recent Labs  Lab 11/24/18 1318 11/24/18 1442 11/24/18 1509 11/24/18 1711 11/25/18 0802  GLUCAP 78 42* 121* 97 231*   Lipid Profile: No results for input(s): CHOL, HDL, LDLCALC, TRIG, CHOLHDL, LDLDIRECT in the last 72 hours. Thyroid Function Tests: No results for input(s): TSH, T4TOTAL, FREET4, T3FREE, THYROIDAB in the last 72 hours. Anemia Panel: No results for input(s): VITAMINB12, FOLATE, FERRITIN, TIBC, IRON, RETICCTPCT in the last 72 hours.    Radiology Studies: I have reviewed all of the imaging during this hospital visit personally     Scheduled Meds: . Chlorhexidine Gluconate Cloth  6 each Topical Daily  . docusate sodium  100 mg Oral BID  . escitalopram  20 mg Oral Daily  . feeding supplement (ENSURE ENLIVE)  237 mL Oral BID BM  . insulin aspart  0-15 Units Subcutaneous TID WC  . LORazepam  0-4 mg Intravenous Q12H   Or  . LORazepam  0-4 mg  Oral Q12H  . mouth rinse  15 mL Mouth Rinse BID  . multivitamin with minerals  1 tablet Oral Daily  . mupirocin ointment  1 application Nasal BID  . pantoprazole (PROTONIX) IV  40 mg Intravenous QHS  . QUEtiapine  300 mg Oral QHS  . thiamine  100 mg Oral Daily   Or  . thiamine  100 mg Intravenous Daily   Continuous Infusions: . 0.9 % NaCl with KCl 20 mEq / L 75 mL/hr at 11/25/18 0400  . levETIRAcetam 500 mg (11/25/18 0740)     LOS: 3 days        Stiven Kaspar Gerome Apley, MD

## 2018-11-25 NOTE — Anesthesia Postprocedure Evaluation (Signed)
Anesthesia Post Note  Patient: Willie [name]  Procedure(s) Performed: Bilateral craniotomy for evacuation of subdural hematoma (Bilateral )     Patient location during evaluation: Other Anesthesia Type: General Level of consciousness: awake and alert Pain management: pain level controlled Vital Signs Assessment: post-procedure vital signs reviewed and stable Respiratory status: spontaneous breathing, nonlabored ventilation and respiratory function stable Cardiovascular status: blood pressure returned to baseline and stable Postop Assessment: no apparent nausea or vomiting Anesthetic complications: no    Last Vitals:  Vitals:   11/25/18 1100 11/25/18 1200  BP: 114/84   Pulse: 89   Resp: 13   Temp:  36.7 C  SpO2: 100%     Last Pain:  Vitals:   11/25/18 1200  TempSrc: Oral  PainSc:                  Salvatore Shear,W. EDMOND

## 2018-11-25 NOTE — Progress Notes (Addendum)
Subjective: Patient reports "My head hurts and I'm moving slow... but ok"  Objective: Vital signs in last 24 hours: Temp:  [97.6 F (36.4 C)-98.6 F (37 C)] 98.2 F (36.8 C) (07/29 0800) Pulse Rate:  [61-118] 104 (07/29 0700) Resp:  [0-23] 13 (07/29 0700) BP: (104-175)/(68-144) 122/85 (07/29 0700) SpO2:  [88 %-100 %] 100 % (07/29 0700) Arterial Line BP: (103-194)/(57-126) 137/85 (07/29 0530) Weight:  [76.2 kg] 76.2 kg (07/28 1357)  Intake/Output from previous day: 07/28 0701 - 07/29 0700 In: 1731.6 [I.V.:1531.5; IV Piggyback:200.1] Out: 6144 [Urine:4375; Drains:260; Blood:50] Intake/Output this shift: Total I/O In: -  Out: 30 [Drains:30]  Alert, eating breakfast. Conversant, with fluent speech. Reports mild headache. MAEW. PEARL.  JP's draining slowly - not holding suction. ~67ml overnight from both.   Lab Results: Recent Labs    11/23/18 0045 11/24/18 0327  WBC 5.2 5.2  HGB 11.8* 10.6*  HCT 34.4* 32.4*  PLT 387 346   BMET Recent Labs    11/24/18 0327 11/25/18 0155  NA 141 138  K 2.9* 3.4*  CL 107 104  CO2 26 24  GLUCOSE 57* 206*  BUN <5* <5*  CREATININE 0.75 0.66  CALCIUM 8.2* 8.2*    Studies/Results: No results found.  Assessment/Plan: Improving   LOS: 3 days  Per Dr. Vertell Limber, will leave drains in place for now. Ok to d/c A-Line. Patient is doing well.  Head CT in AM.   Poteat, Aaron Edelman 11/25/2018, 8:34 AM

## 2018-11-26 ENCOUNTER — Inpatient Hospital Stay (HOSPITAL_COMMUNITY): Payer: Self-pay

## 2018-11-26 LAB — CBC WITH DIFFERENTIAL/PLATELET
Abs Immature Granulocytes: 0.03 10*3/uL (ref 0.00–0.07)
Basophils Absolute: 0 10*3/uL (ref 0.0–0.1)
Basophils Relative: 0 %
Eosinophils Absolute: 0.1 10*3/uL (ref 0.0–0.5)
Eosinophils Relative: 1 %
HCT: 34.1 % — ABNORMAL LOW (ref 39.0–52.0)
Hemoglobin: 11.3 g/dL — ABNORMAL LOW (ref 13.0–17.0)
Immature Granulocytes: 0 %
Lymphocytes Relative: 9 %
Lymphs Abs: 0.8 10*3/uL (ref 0.7–4.0)
MCH: 30.1 pg (ref 26.0–34.0)
MCHC: 33.1 g/dL (ref 30.0–36.0)
MCV: 90.9 fL (ref 80.0–100.0)
Monocytes Absolute: 0.6 10*3/uL (ref 0.1–1.0)
Monocytes Relative: 7 %
Neutro Abs: 6.9 10*3/uL (ref 1.7–7.7)
Neutrophils Relative %: 83 %
Platelets: 327 10*3/uL (ref 150–400)
RBC: 3.75 MIL/uL — ABNORMAL LOW (ref 4.22–5.81)
RDW: 14.2 % (ref 11.5–15.5)
WBC: 8.4 10*3/uL (ref 4.0–10.5)
nRBC: 0 % (ref 0.0–0.2)

## 2018-11-26 LAB — BASIC METABOLIC PANEL
Anion gap: 8 (ref 5–15)
BUN: <5 mg/dL — ABNORMAL LOW (ref 6–20)
CO2: 29 mmol/L (ref 22–32)
Calcium: 8.7 mg/dL — ABNORMAL LOW (ref 8.9–10.3)
Chloride: 102 mmol/L (ref 98–111)
Creatinine, Ser: 0.65 mg/dL (ref 0.61–1.24)
GFR calc Af Amer: >60 mL/min (ref >60)
GFR calc non Af Amer: >60 mL/min (ref >60)
Glucose, Bld: 186 mg/dL — ABNORMAL HIGH (ref 70–99)
Potassium: 4 mmol/L (ref 3.5–5.1)
Sodium: 139 mmol/L (ref 135–145)

## 2018-11-26 LAB — POCT I-STAT 7, (LYTES, BLD GAS, ICA,H+H)
Acid-Base Excess: 5 mmol/L — ABNORMAL HIGH (ref 0.0–2.0)
Bicarbonate: 29.5 mmol/L — ABNORMAL HIGH (ref 20.0–28.0)
Calcium, Ion: 1.24 mmol/L (ref 1.15–1.40)
HCT: 30 % — ABNORMAL LOW (ref 39.0–52.0)
Hemoglobin: 10.2 g/dL — ABNORMAL LOW (ref 13.0–17.0)
O2 Saturation: 96 %
Potassium: 3.1 mmol/L — ABNORMAL LOW (ref 3.5–5.1)
Sodium: 139 mmol/L (ref 135–145)
TCO2: 31 mmol/L (ref 22–32)
pCO2 arterial: 43.8 mmHg (ref 32.0–48.0)
pH, Arterial: 7.436 (ref 7.350–7.450)
pO2, Arterial: 82 mmHg — ABNORMAL LOW (ref 83.0–108.0)

## 2018-11-26 LAB — GLUCOSE, CAPILLARY
Glucose-Capillary: 168 mg/dL — ABNORMAL HIGH (ref 70–99)
Glucose-Capillary: 191 mg/dL — ABNORMAL HIGH (ref 70–99)
Glucose-Capillary: 172 mg/dL — ABNORMAL HIGH (ref 70–99)
Glucose-Capillary: 232 mg/dL — ABNORMAL HIGH (ref 70–99)

## 2018-11-26 LAB — POCT I-STAT 4, (NA,K, GLUC, HGB,HCT)
Glucose, Bld: 112 mg/dL — ABNORMAL HIGH (ref 70–99)
HCT: 28 % — ABNORMAL LOW (ref 39.0–52.0)
Hemoglobin: 9.5 g/dL — ABNORMAL LOW (ref 13.0–17.0)
Potassium: 3 mmol/L — ABNORMAL LOW (ref 3.5–5.1)
Sodium: 138 mmol/L (ref 135–145)

## 2018-11-26 LAB — MAGNESIUM: Magnesium: 1.7 mg/dL (ref 1.7–2.4)

## 2018-11-26 MED ORDER — LISINOPRIL 10 MG PO TABS
10.0000 mg | ORAL_TABLET | Freq: Every day | ORAL | Status: DC
  Administered 2018-11-26 – 2018-11-28 (×3): 10 mg via ORAL
  Filled 2018-11-26 (×3): qty 1

## 2018-11-26 MED ORDER — AMLODIPINE BESYLATE 10 MG PO TABS
10.0000 mg | ORAL_TABLET | Freq: Every day | ORAL | Status: DC
  Administered 2018-11-26 – 2018-11-28 (×3): 10 mg via ORAL
  Filled 2018-11-26 (×3): qty 1

## 2018-11-26 MED ORDER — MAGNESIUM SULFATE 2 GM/50ML IV SOLN
2.0000 g | Freq: Once | INTRAVENOUS | Status: AC
  Administered 2018-11-26: 2 g via INTRAVENOUS
  Filled 2018-11-26: qty 50

## 2018-11-26 NOTE — Progress Notes (Addendum)
PROGRESS NOTE    AKI Willie [name]  UKG:[id number] DOB: [birth year] DOA: 11/22/2018 PCP: Debbrah Alar, NP    Brief Narrative:  43 year old male who presented with abdominal pain and headaches. He does have significant past medical history for hypertension, dyslipidemia, type 2 diabetes mellitus, GERD, depression, anxiety and history of alcohol abuse. He was diagnosed with subdural hematoma November 14, 2018, he was recommended evacuation but he left the hospital (in [place].He had persistent symptoms of headache, nausea, and vomiting. On his initial physical examination blood pressure 161/109, heart rate 100, respiratory rate 18, temperature 98.1, oxygen saturation 98%. His lungs are clear to auscultation bilaterally, heart S1-S2 present rhythm, the abdomen was tender in the midline, no lower extremity edema. Patient was awake and alert, nonfocal. Sodium 137, potassium 4.0, chloride 96, bicarb 23, glucose 233, BUN less than 5, creatinine 0.87, AST 152, ALT 70,lipase 16, white count 4.7, hemoglobin 12.4, hematocrit 37.4, platelets 433.SARS COVID-19 negative.  Head CT with left frontoparietal subdural hematoma measuring 14 mm thick, exerting mass-effect upon the left hemisphere. Right frontoparietal subdural hematoma up to 8 mm thick with mild mass-effect of the right hemisphere.  EKG 111 bpm, normal axis, QTC 500, sinus rhythm, no ST segment or T wave changes. Positive PVC.  Patient was admitted to the hospital working diagnosis of bilateral subdural hematoma,with intracranial hypertension  No patient is sp bilateral craniotomy for evacuation of subdural hematoma (Bilateral) - Bilateral craniotomy for evacuation of subdural hematoma. 11/24/18.     Assessment & Plan:   Principal Problem:   SDH (subdural hematoma) (HCC) Active Problems:   Diabetes type 2, uncontrolled (HCC)   Essential hypertension   Depression with anxiety   Chronic  pancreatitis (HCC)   Alcohol abuse   GERD (gastroesophageal reflux disease)   Abdominal pain   Abnormal LFTs   Subdural bleeding ([place])   1. Bilateral subdural hematoma/ traumatic/ sp post  bilateral craniotomy for subdural hematoma evacuation. Drains have been removed today, continue post op care per primary team.  2. T2DM.His glucose has been stable, fasting 186 and capillary 217, 134, 200, 168, 191. Will continue with current basal insulin of 10 units of glargine plus insulin sliding scale for glucose coverage and monitoring. Patient is tolerating po well.   3. HTNwith prolonged Qtc/ hypokalemia and hypomagnesemia. Echocardiogram with mild reduction in LV systolic function 45 to 76% with inferior and inferior septal hypokinesis. K at goal of 4, Mg still low at 1,7, will repeat order of Mag sulfate 2 grams IV, patient tolerating po well, will dc IV fluids. Blood pressure now elevated 152/109 and 142/102 mmHg. Will resume amlodipine and lisinopril, at home on clonidine as well.   4. Chronic alcohol abuse.Today with significant impored withdrawal symptoms. On as needed benzodiazepines per CIWA protocol.  5. GERD.On  pantoprazole.   6. Depression. On quetiapine and escitalopram, plus prn hydroxyzine.     DVT prophylaxis:scd Code Status:full Family Communication:no family at the bedside Disposition Plan/ discharge barriers:pending clinical improvement  Body mass index is 22.78 kg/m. Malnutrition Type:  Nutrition Problem: Increased nutrient needs Etiology: post-op healing   Malnutrition Characteristics:  Signs/Symptoms: estimated needs   Nutrition Interventions:  Interventions: Ensure Enlive (each supplement provides 350kcal and 20 grams of protein)  RN Pressure Injury Documentation:    Subjective: Patient is awake and alert, tolerating po well, tremors and anxiety have improved, no nausea or vomiting, headache has improved.   Objective: Vitals:    11/26/18 1100 11/26/18 1200 11/26/18 1300 11/26/18  1337  BP: (!) 154/106 (!) 149/114 (!) 152/109   Pulse: 94 (!) 101 92 (!) 103  Resp: (!) 22 15 15 17   Temp:  98.8 F (37.1 C)    TempSrc:  Oral    SpO2: 100% 100% 99% 93%  Weight:      Height:        Intake/Output Summary (Last 24 hours) at 11/26/2018 1423 Last data filed at 11/26/2018 1300 Gross per 24 hour  Intake 1701.52 ml  Output 2250 ml  Net -548.48 ml   Filed Weights   11/22/18 1614 11/22/18 2333 11/24/18 1357  Weight: 61.2 kg 76.2 kg 76.2 kg    Examination:   General: deconditioned  Neurology: Awake and alert, non focal  E ENT: no pallor, no icterus, oral mucosa moist Cardiovascular: No JVD. S1-S2 present, rhythmic, no gallops, rubs, or murmurs. No lower extremity edema. Pulmonary: positive breath sounds bilaterally, adequate air movement, no wheezing, rhonchi or rales. Gastrointestinal. Abdomen with no organomegaly, non tender, no rebound or guarding Skin. No rashes Musculoskeletal: no joint deformities     Data Reviewed: I have personally reviewed following labs and imaging studies  CBC: Recent Labs  Lab 11/22/18 1730 11/23/18 0045 11/24/18 0327 11/24/18 1458 11/24/18 1607 11/26/18 0735  WBC 4.7 5.2 5.2  --   --  8.4  NEUTROABS 3.7  --  2.4  --   --  6.9  HGB 12.4* 11.8* 10.6* 10.2* 9.5* 11.3*  HCT 37.4* 34.4* 32.4* 30.0* 28.0* 34.1*  MCV 90.3 89.1 93.1  --   --  90.9  PLT 433* 387 346  --   --  867   Basic Metabolic Panel: Recent Labs  Lab 11/22/18 1730 11/23/18 0045 11/24/18 0327 11/24/18 1458 11/24/18 1607 11/25/18 0155 11/26/18 0735  NA 137 136 141 139 138 138 139  K 4.0 3.5 2.9* 3.1* 3.0* 3.4* 4.0  CL 96* 98 107  --   --  104 102  CO2 23 26 26   --   --  24 29  GLUCOSE 233* 185* 57*  --  112* 206* 186*  BUN <5* <5* <5*  --   --  <5* <5*  CREATININE 0.87 0.86 0.75  --   --  0.66 0.65  CALCIUM 8.2* 8.2* 8.2*  --   --  8.2* 8.7*  MG  --   --   --   --   --  1.6* 1.7   GFR: Estimated  Creatinine Clearance: 129.6 mL/min (by C-G formula based on SCr of 0.65 mg/dL). Liver Function Tests: Recent Labs  Lab 11/22/18 1730  AST 152*  ALT 70*  ALKPHOS 160*  BILITOT 1.1  PROT 6.4*  ALBUMIN 3.0*   Recent Labs  Lab 11/22/18 1730  LIPASE 16   No results for input(s): AMMONIA in the last 168 hours. Coagulation Profile: Recent Labs  Lab 11/22/18 1730  INR 1.0   Cardiac Enzymes: No results for input(s): CKTOTAL, CKMB, CKMBINDEX, TROPONINI in the last 168 hours. BNP (last 3 results) No results for input(s): PROBNP in the last 8760 hours. HbA1C: No results for input(s): HGBA1C in the last 72 hours. CBG: Recent Labs  Lab 11/25/18 1151 11/25/18 1546 11/25/18 2127 11/26/18 0729 11/26/18 1110  GLUCAP 217* 134* 200* 168* 191*   Lipid Profile: No results for input(s): CHOL, HDL, LDLCALC, TRIG, CHOLHDL, LDLDIRECT in the last 72 hours. Thyroid Function Tests: No results for input(s): TSH, T4TOTAL, FREET4, T3FREE, THYROIDAB in the last 72 hours. Anemia Panel: No results  for input(s): VITAMINB12, FOLATE, FERRITIN, TIBC, IRON, RETICCTPCT in the last 72 hours.    Radiology Studies: I have reviewed all of the imaging during this hospital visit personally     Scheduled Meds: . Chlorhexidine Gluconate Cloth  6 each Topical Daily  . docusate sodium  100 mg Oral BID  . escitalopram  20 mg Oral Daily  . feeding supplement (ENSURE ENLIVE)  237 mL Oral BID BM  . folic acid  1 mg Oral Daily  . insulin aspart  0-15 Units Subcutaneous TID WC  . insulin glargine  10 Units Subcutaneous Daily  . mouth rinse  15 mL Mouth Rinse BID  . multivitamin with minerals  1 tablet Oral Daily  . mupirocin ointment  1 application Nasal BID  . pantoprazole (PROTONIX) IV  40 mg Intravenous QHS  . QUEtiapine  300 mg Oral QHS  . thiamine  100 mg Oral Daily   Or  . thiamine  100 mg Intravenous Daily   Continuous Infusions: . sodium chloride 75 mL/hr at 11/26/18 1300  . levETIRAcetam  Stopped (11/26/18 0641)  . magnesium sulfate bolus IVPB       LOS: 4 days         Gerome Apley, MD

## 2018-11-26 NOTE — Progress Notes (Addendum)
Subjective: Patient reports "I get sick when I get up or down"  Objective: Vital signs in last 24 hours: Temp:  [97.5 F (36.4 C)-99.3 F (37.4 C)] 97.5 F (36.4 C) (07/30 0400) Pulse Rate:  [75-138] 93 (07/30 0700) Resp:  [11-27] 17 (07/30 0700) BP: (114-170)/(82-116) 152/97 (07/30 0700) SpO2:  [94 %-100 %] 99 % (07/30 0700) Arterial Line BP: (125-127)/(74-77) 127/74 (07/29 0900)  Intake/Output from previous day: 07/29 0701 - 07/30 0700 In: 2084.3 [I.V.:1745.8; IV Piggyback:338.5] Out: 1800 [Urine:1550; Drains:250] Intake/Output this shift: No intake/output data recorded.  Awakens to voice. Reports persistent mild headache. N/V with position changes this am at CT. PEARL. No drift. MAEW.  Dr. Vertell Limber has reviewed CT - SDH resolved, air present likely causing N/V.   Lab Results: Recent Labs    11/24/18 0327 11/24/18 1458 11/24/18 1607  WBC 5.2  --   --   HGB 10.6* 10.2* 9.5*  HCT 32.4* 30.0* 28.0*  PLT 346  --   --    BMET Recent Labs    11/24/18 0327  11/24/18 1607 11/25/18 0155  NA 141   < > 138 138  K 2.9*   < > 3.0* 3.4*  CL 107  --   --  104  CO2 26  --   --  24  GLUCOSE 57*  --  112* 206*  BUN <5*  --   --  <5*  CREATININE 0.75  --   --  0.66  CALCIUM 8.2*  --   --  8.2*   < > = values in this interval not displayed.    Studies/Results: Ct Head Wo Contrast  Result Date: 11/26/2018 CLINICAL DATA:  Follow-up subdural hematoma EXAM: CT HEAD WITHOUT CONTRAST TECHNIQUE: Contiguous axial images were obtained from the base of the skull through the vertex without intravenous contrast. COMPARISON:  Four days ago FINDINGS: Brain: Bilateral subdural drainage with collections primarily containing gas today. The collections are larger than before, with increased cerebral mass effect, especially on the left where the gas collection measures up to 3 cm in thickness. On the right collection measures up to 14 mm. No evidence of infarct. No hydrocephalus. Vascular: Negative  Skull: Small bilateral craniotomies. Sinuses/Orbits: Negative IMPRESSION: Bilateral subdural evacuation with subdural spaces distended by gas, left more than right. Attendant increase in frontal mass effect. The left-sided collection measures up to 3 cm in thickness. Electronically Signed   By: Monte Fantasia M.D.   On: 11/26/2018 07:21    Assessment/Plan: Improving  LOS: 4 days  Per DrStern, d/c drains (pt tolerated well, staple to sites, no bleeding), Oxygen 2lpm via n.c. will help N/V and air resorption. Mobilize as tolerated.   Verdis Prime 11/26/2018, 7:33 AM   Pneumocephalus on head CT.  Should resolve with some time.

## 2018-11-26 NOTE — Progress Notes (Signed)
Patient ID: Willie [name], male   DOB: Nov 28, 1975, 43 y.o.   MRN: [id number] Awake, conversant. Headache mild. No more episodes of nausea or vomiting since this am. MAEW. Up to chair earlier without incident.

## 2018-11-27 LAB — BASIC METABOLIC PANEL
Anion gap: 8 (ref 5–15)
BUN: 7 mg/dL (ref 6–20)
CO2: 31 mmol/L (ref 22–32)
Calcium: 8.5 mg/dL — ABNORMAL LOW (ref 8.9–10.3)
Chloride: 100 mmol/L (ref 98–111)
Creatinine, Ser: 0.71 mg/dL (ref 0.61–1.24)
GFR calc Af Amer: >60 mL/min (ref >60)
GFR calc non Af Amer: >60 mL/min (ref >60)
Glucose, Bld: 229 mg/dL — ABNORMAL HIGH (ref 70–99)
Potassium: 3.9 mmol/L (ref 3.5–5.1)
Sodium: 139 mmol/L (ref 135–145)

## 2018-11-27 LAB — GLUCOSE, CAPILLARY
Glucose-Capillary: 231 mg/dL — ABNORMAL HIGH (ref 70–99)
Glucose-Capillary: 262 mg/dL — ABNORMAL HIGH (ref 70–99)
Glucose-Capillary: 102 mg/dL — ABNORMAL HIGH (ref 70–99)
Glucose-Capillary: 90 mg/dL (ref 70–99)

## 2018-11-27 LAB — MAGNESIUM: Magnesium: 1.8 mg/dL (ref 1.7–2.4)

## 2018-11-27 MED ORDER — INSULIN GLARGINE 100 UNIT/ML ~~LOC~~ SOLN
5.0000 [IU] | Freq: Once | SUBCUTANEOUS | Status: AC
  Administered 2018-11-27: 5 [IU] via SUBCUTANEOUS
  Filled 2018-11-27: qty 0.05

## 2018-11-27 MED ORDER — INSULIN GLARGINE 100 UNIT/ML ~~LOC~~ SOLN
15.0000 [IU] | Freq: Every day | SUBCUTANEOUS | Status: DC
  Administered 2018-11-28: 12:00:00 15 [IU] via SUBCUTANEOUS
  Filled 2018-11-27: qty 0.15

## 2018-11-27 MED ORDER — LORAZEPAM 1 MG PO TABS
1.0000 mg | ORAL_TABLET | ORAL | Status: DC | PRN
  Administered 2018-11-27: 1 mg via ORAL
  Filled 2018-11-27: qty 1

## 2018-11-27 MED ORDER — MAGNESIUM SULFATE 2 GM/50ML IV SOLN
2.0000 g | Freq: Once | INTRAVENOUS | Status: AC
  Administered 2018-11-27: 2 g via INTRAVENOUS
  Filled 2018-11-27: qty 50

## 2018-11-27 NOTE — Plan of Care (Signed)
  Problem: Clinical Measurements: Goal: Ability to maintain clinical measurements within normal limits will improve Outcome: Progressing Goal: Will remain free from infection Outcome: Progressing Goal: Diagnostic test results will improve Outcome: Progressing Goal: Respiratory complications will improve Outcome: Progressing Note: Participating in incentive spirometry Goal: Cardiovascular complication will be avoided Outcome: Progressing   Problem: Activity: Goal: Risk for activity intolerance will decrease Outcome: Progressing Note: Pt ambulating independently with no difficulties   Problem: Nutrition: Goal: Adequate nutrition will be maintained Outcome: Progressing   Problem: Coping: Goal: Level of anxiety will decrease Outcome: Progressing   Problem: Elimination: Goal: Will not experience complications related to bowel motility Outcome: Progressing Goal: Will not experience complications related to urinary retention Outcome: Progressing

## 2018-11-27 NOTE — Progress Notes (Signed)
PROGRESS NOTE    Willie [name]  OAC:[id number] DOB: [birth year] DOA: 11/22/2018 PCP: Debbrah Alar, NP    Brief Narrative:  43 year old male who presented with abdominal pain and headaches. He does have significant past medical history for hypertension, dyslipidemia, type 2 diabetes mellitus, GERD, depression, anxiety and history of alcohol abuse. He was diagnosed with subdural hematoma November 14, 2018, he was recommended evacuation but he left the hospital (in [place].He had persistent symptoms of headache, nausea, and vomiting. On his initial physical examination blood pressure 161/109, heart rate 100, respiratory rate 18, temperature 98.1, oxygen saturation 98%. His lungs are clear to auscultation bilaterally, heart S1-S2 present rhythm, the abdomen was tender in the midline, no lower extremity edema. Patient was awake and alert, nonfocal. Sodium 137, potassium 4.0, chloride 96, bicarb 23, glucose 233, BUN less than 5, creatinine 0.87, AST 152, ALT 70,lipase 16, white count 4.7, hemoglobin 12.4, hematocrit 37.4, platelets 433.SARS COVID-19 negative.  Head CT with left frontoparietal subdural hematoma measuring 14 mm thick, exerting mass-effect upon the left hemisphere. Right frontoparietal subdural hematoma up to 8 mm thick with mild mass-effect of the right hemisphere.  EKG 111 bpm, normal axis, QTC 500, sinus rhythm, no ST segment or T wave changes. Positive PVC.  Patient was admitted to the hospital working diagnosis of bilateral subdural hematoma,with intracranial hypertension  No patient is sp bilateral craniotomy for evacuation of subdural hematoma (Bilateral) - Bilateral craniotomy for evacuation of subdural hematoma. 11/24/18.    Assessment & Plan:   Principal Problem:   SDH (subdural hematoma) (HCC) Active Problems:   Diabetes type 2, uncontrolled (HCC)   Essential hypertension   Depression with anxiety   Chronic  pancreatitis (HCC)   Alcohol abuse   GERD (gastroesophageal reflux disease)   Abdominal pain   Abnormal LFTs   Subdural bleeding ([place])    1. Bilateral subdural hematoma/ traumatic/ sp post  bilateralcraniotomy for subdural hematoma evacuation.  Headache continue to improve. Plan to mobilize and possible dc home in am.   2. T2DM. Fasting glucose this am 229, with capillary measurements 232, 231, 262. Patient is tolerating po well. Will increase basal insulin to 15 units and continue insulin sliding scale. Patient at home on 25 units of basal insulin plus sliding scale short acting insulin before meals.   3. HTNwith prolonged Qtc/ hypokalemia and hypomagnesemia.Echocardiogram with mild reduction in LV systolic function 45 to 01% with inferior and inferior septal hypokinesis.K at 3,9 and Mg at 1,8, will continue Mg correction with IV Mag sulfate 2 grams. Target K 4 and Mg at 2.  Blood pressure has improved 111/88. Continue blood pressure control with amlodipine 10 mg and lisinopril 10 mg. Will avoid clonidine for now.   4. Chronic alcohol abuse.Patient has been calm, no anxiety or tremors, will continue neuro checks per unit protocol and will change lorazepam to po as needed every 4 H, while hospitalized.   5. GERD.Continue with  pantoprazole.  6. Depression. Continue withquetiapine and escitalopram, plus prn hydroxyzine.    DVT prophylaxis:scd Code Status:full Family Communication:no family at the bedside Disposition Plan/ discharge barriers:pending clinical improvement   Body mass index is 22.78 kg/m. Malnutrition Type:  Nutrition Problem: Increased nutrient needs Etiology: post-op healing   Malnutrition Characteristics:  Signs/Symptoms: estimated needs   Nutrition Interventions:  Interventions: Ensure Enlive (each supplement provides 350kcal and 20 grams of protein)  RN Pressure Injury Documentation:     Procedures:  Bilateral craniotomy  for evacuation of subdural hematoma (  Bilateral) - Bilateral craniotomy for evacuation of subdural hematoma  Subjective: Patient is feeling better, headache seems to be improving, no anxiety or tremors, no nausea or vomiting, no chest pain or dyspnea.   Objective: Vitals:   11/27/18 0600 11/27/18 0700 11/27/18 0800 11/27/18 0809  BP: 113/78 104/78 107/86   Pulse: 98 (!) 104 (!) 131 (!) 109  Resp: (!) 6 (!) 9 (!) 21 12  Temp:   99.9 F (37.7 C)   TempSrc:   Oral   SpO2: 99% 100% 99% 98%  Weight:      Height:        Intake/Output Summary (Last 24 hours) at 11/27/2018 0913 Last data filed at 11/27/2018 0800 Gross per 24 hour  Intake 3122.94 ml  Output 1725 ml  Net 1397.94 ml   Filed Weights   11/22/18 1614 11/22/18 2333 11/24/18 1357  Weight: 61.2 kg 76.2 kg 76.2 kg    Examination:   General: deconditioned  Neurology: Awake and alert, non focal  E ENT: mild pallor, no icterus, oral mucosa moist Cardiovascular: No JVD. S1-S2 present, rhythmic, no gallops, rubs, or murmurs. No lower extremity edema. Pulmonary: positive breath sounds bilaterally, adequate air movement, no wheezing, rhonchi or rales. Gastrointestinal. Abdomen with no organomegaly, non tender, no rebound or guarding Skin. No rashes Musculoskeletal: no joint deformities     Data Reviewed: I have personally reviewed following labs and imaging studies  CBC: Recent Labs  Lab 11/22/18 1730 11/23/18 0045 11/24/18 0327 11/24/18 1458 11/24/18 1607 11/26/18 0735  WBC 4.7 5.2 5.2  --   --  8.4  NEUTROABS 3.7  --  2.4  --   --  6.9  HGB 12.4* 11.8* 10.6* 10.2* 9.5* 11.3*  HCT 37.4* 34.4* 32.4* 30.0* 28.0* 34.1*  MCV 90.3 89.1 93.1  --   --  90.9  PLT 433* 387 346  --   --  102   Basic Metabolic Panel: Recent Labs  Lab 11/23/18 0045 11/24/18 0327 11/24/18 1458 11/24/18 1607 11/25/18 0155 11/26/18 0735 11/27/18 0710  NA 136 141 139 138 138 139 139  K 3.5 2.9* 3.1* 3.0* 3.4* 4.0 3.9  CL 98 107  --    --  104 102 100  CO2 26 26  --   --  24 29 31   GLUCOSE 185* 57*  --  112* 206* 186* 229*  BUN <5* <5*  --   --  <5* <5* 7  CREATININE 0.86 0.75  --   --  0.66 0.65 0.71  CALCIUM 8.2* 8.2*  --   --  8.2* 8.7* 8.5*  MG  --   --   --   --  1.6* 1.7 1.8   GFR: Estimated Creatinine Clearance: 129.6 mL/min (by C-G formula based on SCr of 0.71 mg/dL). Liver Function Tests: Recent Labs  Lab 11/22/18 1730  AST 152*  ALT 70*  ALKPHOS 160*  BILITOT 1.1  PROT 6.4*  ALBUMIN 3.0*   Recent Labs  Lab 11/22/18 1730  LIPASE 16   No results for input(s): AMMONIA in the last 168 hours. Coagulation Profile: Recent Labs  Lab 11/22/18 1730  INR 1.0   Cardiac Enzymes: No results for input(s): CKTOTAL, CKMB, CKMBINDEX, TROPONINI in the last 168 hours. BNP (last 3 results) No results for input(s): PROBNP in the last 8760 hours. HbA1C: No results for input(s): HGBA1C in the last 72 hours. CBG: Recent Labs  Lab 11/26/18 0729 11/26/18 1110 11/26/18 1636 11/26/18 2143 11/27/18 0711  GLUCAP 168*  191* 172* 232* 231*   Lipid Profile: No results for input(s): CHOL, HDL, LDLCALC, TRIG, CHOLHDL, LDLDIRECT in the last 72 hours. Thyroid Function Tests: No results for input(s): TSH, T4TOTAL, FREET4, T3FREE, THYROIDAB in the last 72 hours. Anemia Panel: No results for input(s): VITAMINB12, FOLATE, FERRITIN, TIBC, IRON, RETICCTPCT in the last 72 hours.    Radiology Studies: I have reviewed all of the imaging during this hospital visit personally     Scheduled Meds: . amLODipine  10 mg Oral Daily  . Chlorhexidine Gluconate Cloth  6 each Topical Daily  . docusate sodium  100 mg Oral BID  . escitalopram  20 mg Oral Daily  . feeding supplement (ENSURE ENLIVE)  237 mL Oral BID BM  . folic acid  1 mg Oral Daily  . insulin aspart  0-15 Units Subcutaneous TID WC  . insulin glargine  10 Units Subcutaneous Daily  . lisinopril  10 mg Oral Daily  . mouth rinse  15 mL Mouth Rinse BID  .  multivitamin with minerals  1 tablet Oral Daily  . mupirocin ointment  1 application Nasal BID  . pantoprazole (PROTONIX) IV  40 mg Intravenous QHS  . QUEtiapine  300 mg Oral QHS  . thiamine  100 mg Oral Daily   Or  . thiamine  100 mg Intravenous Daily   Continuous Infusions: . levETIRAcetam Stopped (11/27/18 0702)  . magnesium sulfate bolus IVPB       LOS: 5 days        Donnis Phaneuf Gerome Apley, MD

## 2018-11-27 NOTE — Progress Notes (Addendum)
Subjective: Patient reports "It's better" (headache)  Objective: Vital signs in last 24 hours: Temp:  [98.8 F (37.1 C)-99.1 F (37.3 C)] 99.1 F (37.3 C) (07/30 2000) Pulse Rate:  [82-114] 98 (07/31 0600) Resp:  [6-28] 6 (07/31 0600) BP: (112-161)/(75-126) 113/78 (07/31 0600) SpO2:  [93 %-100 %] 99 % (07/31 0600)  Intake/Output from previous day: 07/30 0701 - 07/31 0700 In: 2772.5 [P.O.:2110; I.V.:512.5; IV Piggyback:150] Out: 2400 [Urine:2400] Intake/Output this shift: No intake/output data recorded.  Alert, conversant, eating breakfast. Reports improving headache. MAEW. PEARL. No drift. Minimal bloddy drainage left scalp drsg, no drainage right scalp drsg.   Lab Results: Recent Labs    11/24/18 1607 11/26/18 0735  WBC  --  8.4  HGB 9.5* 11.3*  HCT 28.0* 34.1*  PLT  --  327   BMET Recent Labs    11/25/18 0155 11/26/18 0735  NA 138 139  K 3.4* 4.0  CL 104 102  CO2 24 29  GLUCOSE 206* 186*  BUN <5* <5*  CREATININE 0.66 0.65  CALCIUM 8.2* 8.7*    Studies/Results: Ct Head Wo Contrast  Result Date: 11/26/2018 CLINICAL DATA:  Follow-up subdural hematoma EXAM: CT HEAD WITHOUT CONTRAST TECHNIQUE: Contiguous axial images were obtained from the base of the skull through the vertex without intravenous contrast. COMPARISON:  Four days ago FINDINGS: Brain: Bilateral subdural drainage with collections primarily containing gas today. The collections are larger than before, with increased cerebral mass effect, especially on the left where the gas collection measures up to 3 cm in thickness. On the right collection measures up to 14 mm. No evidence of infarct. No hydrocephalus. Vascular: Negative Skull: Small bilateral craniotomies. Sinuses/Orbits: Negative IMPRESSION: Bilateral subdural evacuation with subdural spaces distended by gas, left more than right. Attendant increase in frontal mass effect. The left-sided collection measures up to 3 cm in thickness. Electronically Signed    By: Monte Fantasia M.D.   On: 11/26/2018 07:21    Assessment/Plan: improving  LOS: 5 days  Mobilize as tolerated   Verdis Prime 11/27/2018, 8:03 AM   Patient is doing well.  Transfer from ICU.  Home in AM if doing well.

## 2018-11-28 ENCOUNTER — Other Ambulatory Visit: Payer: Self-pay | Admitting: Neurological Surgery

## 2018-11-28 LAB — GLUCOSE, CAPILLARY
Glucose-Capillary: 183 mg/dL — ABNORMAL HIGH (ref 70–99)
Glucose-Capillary: 225 mg/dL — ABNORMAL HIGH (ref 70–99)

## 2018-11-28 MED ORDER — IBUPROFEN 200 MG PO TABS: 400.0000 mg | ORAL_TABLET | Freq: Four times a day (QID) | ORAL | Status: DC | PRN

## 2018-11-28 MED ORDER — LISINOPRIL 20 MG PO TABS: 20.0000 mg | ORAL_TABLET | Freq: Every day | ORAL | 0 refills | Status: DC

## 2018-11-28 MED ORDER — OXYCODONE HCL 5 MG PO TABS: 5.0000 mg | ORAL_TABLET | ORAL | 0 refills | Status: DC | PRN

## 2018-11-28 MED ORDER — LEVETIRACETAM 500 MG PO TABS: 500.0000 mg | ORAL_TABLET | Freq: Two times a day (BID) | ORAL | 0 refills | Status: DC

## 2018-11-28 MED ORDER — LEVETIRACETAM 500 MG PO TABS
500.0000 mg | ORAL_TABLET | Freq: Two times a day (BID) | ORAL | Status: DC
  Filled 2018-11-28: qty 1

## 2018-11-28 MED ORDER — AMLODIPINE BESYLATE 10 MG PO TABS: 10.0000 mg | ORAL_TABLET | Freq: Every day | ORAL | 0 refills | Status: DC

## 2018-11-28 MED ORDER — IBUPROFEN 400 MG PO TABS: 400.0000 mg | ORAL_TABLET | Freq: Four times a day (QID) | ORAL | 0 refills | Status: DC | PRN

## 2018-11-28 NOTE — Discharge Summary (Signed)
Physician Discharge Summary  Willie [name] BWG:[id number] DOB: 1975/08/19 DOA: 11/22/2018  PCP: Debbrah Alar, NP  Admit date: 11/22/2018 Discharge date: 11/28/2018  Admitted From: Home Disposition:  Home   Recommendations for Outpatient Follow-up and new medication changes:  1. Follow up with Debbrah Alar in 7 days.  2. Follow up with Dr. Vertell Limber in 2 weeks. 3. Discontinue clonidine.  4. Continue Keppra for 2 weeks.   Home Health: no   Equipment/Devices: no    Discharge Condition: Home CODE STATUS: Full  Diet recommendation: Heart healthy and diabetic prudent.   Brief/Interim Summary: 43 year old male who presented with abdominal pain and headaches. He does have significant past medical history for hypertension, dyslipidemia, type 2 diabetes mellitus, GERD, depression, anxiety and history of alcohol abuse. He was diagnosed with subdural hematoma November 14, 2018, and he was recommended evacuation but he left the hospital (in [place].He had persistent symptoms of headache, nausea, and vomiting. On his initial physical examination blood pressure 161/109, heart rate 100, respiratory rate 18, temperature 98.1, oxygen saturation 98%. His lungs were clear to auscultation bilaterally, heart S1-S2 present and rhythmic, the abdomen was tender in the midline, no lower extremity edema. Patient was awake and alert, nonfocal. Sodium 137, potassium 4.0, chloride 96, bicarb 23, glucose 233, BUN less than 5, creatinine 0.87, AST 152, ALT 70,lipase 16, white count 4.7, hemoglobin 12.4, hematocrit 37.4, platelets 433.SARS COVID-19 negative.  Head CT with left frontoparietal subdural hematoma measuring 14 mm thick, exerting mass-effect upon the left hemisphere. Right frontoparietal subdural hematoma up to 8 mm thick with mild mass-effect of the right hemisphere.  EKG 111 bpm, normal axis, QTC 500, sinus rhythm, no ST segment or T wave changes. Positive  PVC.  Patient was admitted to the hospital with the working diagnosis of bilateral subdural hematoma,with intracranial hypertension, plus uncontrolled systemic HTN/ hypertensive urgency.   1.  Bilateral subdural hematomas, traumatic, present on admission, status post bilateral craniotomy and evacuation.  Patient was admitted to the progressive care unit, he had frequent neuro checks, started on Keppra for seizure prophylaxis and was consulted by Neurosurgery.  Patient underwent bilateral craniotomy with subdural evacuation on July 28 with no major complications.  Postoperatively he was monitored in the neuro intensive care unit.  Patient will be discharged home, follow-up with Dr. Vertell Limber in 2 weeks, continue Keppra for 2 more weeks.  2.  Uncontrolled hypertension, hypertensive urgency.  Patient's blood pressure was controlled with oral antihypertensive agents, he was resumed on amlodipine and lisinopril with good toleration.  His discharge blood pressure is 132/90.  To avoid rebound hypertension will not resume clonidine.  He will need a close outpatient follow-up.  3.  Prolonged QTc interval/complicated by hypokalemia and hypomagnesemia.  Patient remained sinus rhythm on telemetry monitor, his electrolytes were aggressively corrected, discharge potassium 3.9, magnesium 1.8, preserved kidney function with a serum creatinine of 0.71.  Further work-up with echocardiography showed mild reduction in his LV systolic function 45 to 17% with inferior and inferior septal hypokinesis.  Will check EKG before discharge.  Continue blood pressure control as an outpatient and avoid further alcohol.  4.  Chronic alcohol abuse, with alcohol withdrawal syndrome. Patient developed withdrawal symptoms that were controlled with lorazepam per CIWA protocol.  At discharge no further withdrawal symptoms.  He was advised about alcohol consumption cessation.  5.  GERD.  Continue antiacid therapy with pantoprazole.  6.   Depression.  Continue escitalopram and quetiapine.  Discharge Diagnoses:  Principal Problem:  SDH (subdural hematoma) (HCC) Active Problems:   Diabetes type 2, uncontrolled (HCC)   Essential hypertension   Depression with anxiety   Chronic pancreatitis (HCC)   Alcohol abuse   GERD (gastroesophageal reflux disease)   Abdominal pain   Abnormal LFTs   Subdural bleeding [facility])    Discharge Instructions   Allergies as of 11/28/2018      Reactions   Invokamet [canagliflozin-metformin Hcl] Other (See Comments)   Lactic acidosis   Metformin And Related Other (See Comments)   DRASTIC drop in blood sugar      Medication List    STOP taking these medications   cloNIDine 0.1 MG tablet Commonly known as: CATAPRES   cyclobenzaprine 5 MG tablet Commonly known as: FLEXERIL   folic acid 1 MG tablet Commonly known as: FOLVITE   thiamine 100 MG tablet   zolpidem 5 MG tablet Commonly known as: AMBIEN     TAKE these medications   ALPRAZolam 1 MG tablet Commonly known as: XANAX Take 1 tablet (1 mg total) by mouth 3 (three) times daily as needed for anxiety.   amLODipine 10 MG tablet Commonly known as: NORVASC Take 1 tablet (10 mg total) by mouth daily.   blood glucose meter kit and supplies Kit Dispense based on patient and insurance preference. Use up to four times daily as directed. (FOR ICD-9 250.00, 250.01).   Cialis 10 MG tablet Generic drug: tadalafil TAKE 1 TABLET BY MOUTH DAILY AS NEEDED FOR ERECTILE DYSFUNCTION What changed: See the new instructions.   escitalopram 20 MG tablet Commonly known as: LEXAPRO Take 1 tablet (20 mg total) by mouth daily.   freestyle lancets 2 (two) times daily   FreeStyle Libre 14 Day Sensor Misc Inject 1 each into the skin every 14 (fourteen) days.   glucose blood test strip Commonly known as: FREESTYLE LITE Use as instructed   ibuprofen 400 MG tablet Commonly known as: ADVIL Take 1 tablet (400 mg total) by mouth every 6  (six) hours as needed for moderate pain.   Insulin Glargine 100 UNIT/ML Solostar Pen Commonly known as: Lantus SoloStar Inject 25 Units into the skin at bedtime. What changed: how much to take   insulin lispro 100 UNIT/ML KwikPen Commonly known as: HumaLOG KwikPen Inject 0.05-0.15 mLs (5-15 Units total) into the skin 3 (three) times daily. Inject subcutaneously 3 times a day (just before each meal) 09-10-08 units Humalog 5 units with small meal, 10 units with medium sized meal, and 15 units with large meal.   Insulin Pen Needle 31G X 8 MM Misc Commonly known as: UltiCare Short Pen Needles Use once daily to inject insulin.   levETIRAcetam 500 MG tablet Commonly known as: KEPPRA Take 1 tablet (500 mg total) by mouth 2 (two) times daily for 14 days.   lisinopril 20 MG tablet Commonly known as: ZESTRIL Take 1 tablet (20 mg total) by mouth daily for 3 days.   LORazepam 1 MG tablet Commonly known as: ATIVAN Take 1 mg by mouth every 6 (six) hours as needed for anxiety.   multivitamin tablet Take 1 tablet by mouth daily.   ondansetron 4 MG disintegrating tablet Commonly known as: Zofran ODT 31m ODT q4 hours prn nausea/vomit What changed:   how much to take  how to take this  when to take this  reasons to take this  additional instructions   pantoprazole 40 MG tablet Commonly known as: PROTONIX Take 1 tablet (40 mg total) by mouth 2 (two) times daily.  QUEtiapine 300 MG tablet Commonly known as: SEROQUEL Take 1 tablet (300 mg total) by mouth at bedtime.   sildenafil 20 MG tablet Commonly known as: REVATIO Take 1-2 tabs by mouth once daily as needed 30-60 minutes prior to sexual activity.       Allergies  Allergen Reactions  . Invokamet [Canagliflozin-Metformin Hcl] Other (See Comments)    Lactic acidosis  . Metformin And Related Other (See Comments)    DRASTIC drop in blood sugar    Consultations:  Neurosurgery    Procedures/Studies: Ct Head Wo  Contrast  Result Date: 11/26/2018 CLINICAL DATA:  Follow-up subdural hematoma EXAM: CT HEAD WITHOUT CONTRAST TECHNIQUE: Contiguous axial images were obtained from the base of the skull through the vertex without intravenous contrast. COMPARISON:  Four days ago FINDINGS: Brain: Bilateral subdural drainage with collections primarily containing gas today. The collections are larger than before, with increased cerebral mass effect, especially on the left where the gas collection measures up to 3 cm in thickness. On the right collection measures up to 14 mm. No evidence of infarct. No hydrocephalus. Vascular: Negative Skull: Small bilateral craniotomies. Sinuses/Orbits: Negative IMPRESSION: Bilateral subdural evacuation with subdural spaces distended by gas, left more than right. Attendant increase in frontal mass effect. The left-sided collection measures up to 3 cm in thickness. Electronically Signed   By: Monte Fantasia M.D.   On: 11/26/2018 07:21   Ct Head Wo Contrast  Result Date: 11/22/2018 CLINICAL DATA:  Posttraumatic headache, nausea and vomiting, LEFT side head pain; just got back from [facility], [place], went to hospital and was told he we had a blood clot in his brain, unclear about when he actually fell and when he arrived home EXAM: CT HEAD WITHOUT CONTRAST TECHNIQUE: Contiguous axial images were obtained from the base of the skull through the vertex without intravenous contrast. Sagittal and coronal MPR images reconstructed from axial data set. COMPARISON:  06/07/2017 FINDINGS: Brain: Normal ventricular morphology. Intermediate attenuation LEFT frontoparietal subdural hematoma measuring up to 14 mm thick. Mass effect upon the lateral aspect of the RIGHT hemisphere. Additionally, older appearing intermediate to low attenuation RIGHT frontoparietal subdural hematoma measuring up to 8 mm thick with mild mass effect upon the RIGHT hemisphere. No intraparenchymal hemorrhage, evidence acute  infarct, or mass. 1 mm of LEFT-to-RIGHT midline shift. Vascular: Unremarkable Skull: Intact Sinuses/Orbits: Clear Other: N/A IMPRESSION: Intermediate attenuation LEFT frontoparietal subdural hematoma measuring 14 mm thick, exerting mass effect upon the LEFT hemisphere. Intermediate to low attenuation RIGHT frontal parietal subdural hematoma up to 8 mm thick with mild mass effect upon RIGHT hemisphere. No intraparenchymal hemorrhage or infarction. 1 mm LEFT-to-RIGHT midline shift. Critical Value/emergent results were called by telephone at the time of interpretation on 11/22/2018 at 5:31 pm to Dr. Julianne Rice , who verbally acknowledged these results. Electronically Signed   By: Lavonia Dana M.D.   On: 11/22/2018 17:31      Procedures:  Bilateral craniotomy for evacuation of subdural hematoma (Bilateral) - Bilateral craniotomy for evacuation of subdural hematoma. 11/24/18.   Subjective: Patient is feeling well, no nausea or vomiting, no chest pain or dyspnea.   Discharge Exam: Vitals:   11/28/18 0700 11/28/18 0800  BP: 132/90 (!) 127/102  Pulse: 85 91  Resp: 12 11  Temp:  98.3 F (36.8 C)  SpO2: 98% 100%   Vitals:   11/28/18 0607 11/28/18 0612 11/28/18 0700 11/28/18 0800  BP: (!) 131/94  132/90 (!) 127/102  Pulse: 89 87 85 91  Resp: (!) _0 Temp:    98.3 F (36.8 C)  TempSrc:    Oral  SpO2: 97% 100% 98% 100%  Weight:      Height:        General: Not in pain or dyspnea  Neurology: Awake and alert, non focal  E ENT: no pallor, no icterus, oral mucosa moist Cardiovascular: No JVD. S1-S2 present, rhythmic, no gallops, rubs, or murmurs. No lower extremity edema. Pulmonary: vesicular breath sounds bilaterally, adequate air movement, no wheezing, rhonchi or rales. Gastrointestinal. Abdomen  with no organomegaly, non tender, no rebound or guarding Skin. No rashes/ scalp post surgical wounds in place.  Musculoskeletal: no joint deformities   The results of significant  diagnostics from this hospitalization (including imaging, microbiology, ancillary and laboratory) are listed below for reference.     Microbiology: Recent Results (from the past 240 hour(s))  SARS Coronavirus 2 (Performed in [place] hospital lab)     Status: None   Collection Time: 11/22/18  5:35 PM   Specimen: Nasopharyngeal Swab  Result Value Ref Range Status   SARS Coronavirus 2 NEGATIVE NEGATIVE Final    Comment: (NOTE) If result is NEGATIVE SARS-CoV-2 target nucleic acids are NOT DETECTED. The SARS-CoV-2 RNA is generally detectable in upper and lower  respiratory specimens during the acute phase of infection. The lowest  concentration of SARS-CoV-2 viral copies this assay can detect is 250  copies / mL. A negative result does not preclude SARS-CoV-2 infection  and should not be used as the sole basis for treatment or other  patient management decisions.  A negative result may occur with  improper specimen collection / handling, submission of specimen other  than nasopharyngeal swab, presence of viral mutation(s) within the  areas targeted by this assay, and inadequate number of viral copies  (<250 copies / mL). A negative result must be combined with clinical  observations, patient history, and epidemiological information. If result is POSITIVE SARS-CoV-2 target nucleic acids are DETECTED. The SARS-CoV-2 RNA is generally detectable in upper and lower  respiratory specimens dur ing the acute phase of infection.  Positive  results are indicative of active infection with SARS-CoV-2.  Clinical  correlation with patient history and other diagnostic information is  necessary to determine patient infection status.  Positive results do  not rule out bacterial infection or co-infection with other viruses. If result is PRESUMPTIVE POSTIVE SARS-CoV-2 nucleic acids MAY BE PRESENT.   A presumptive positive result was obtained on the submitted specimen  and confirmed on repeat testing.   While 2019 novel coronavirus  (SARS-CoV-2) nucleic acids may be present in the submitted sample  additional confirmatory testing may be necessary for epidemiological  and / or clinical management purposes  to differentiate between  SARS-CoV-2 and other Sarbecovirus currently known to infect humans.  If clinically indicated additional testing with an alternate test  methodology [phone]) is advised. The SARS-CoV-2 RNA is generally  detectable in upper and lower respiratory sp ecimens during the acute  phase of infection. The expected result is Negative. Fact Sheet for Patients:  StrictlyIdeas.no Fact Sheet for Healthcare Providers: BankingDealers.co.za This test is not yet approved or cleared by the [place] FDA and has been authorized for detection and/or diagnosis of SARS-CoV-2 by FDA under an Emergency Use Authorization (EUA).  This EUA will remain in effect (meaning this test can be used) for the duration of the COVID-19 declaration under Section 564(b)(1) of the Act, 21 U.S.C. section 360bbb-3(b)(1), unless  the authorization is terminated or revoked sooner. Performed at [facility], [address], [place], [place] [address]   Surgical PCR screen     Status: Abnormal   Collection Time: 11/24/18  7:57 AM   Specimen: Nasal Mucosa; Nasal Swab  Result Value Ref Range Status   MRSA, PCR NEGATIVE NEGATIVE Final   Staphylococcus aureus POSITIVE (A) NEGATIVE Final    Comment: (NOTE) The Xpert SA Assay (FDA approved for NASAL specimens in patients 17 years of age and older), is one component of a comprehensive surveillance program. It is not intended to diagnose infection nor to guide or monitor treatment. Performed at [facility], [place] [address], [place], [place] [address]      Labs: BNP (last 3 results) No results for input(s): BNP in the last 8760 hours. Basic Metabolic Panel: Recent Labs  Lab  11/23/18 0045 11/24/18 0327 11/24/18 1458 11/24/18 1607 11/25/18 0155 11/26/18 0735 11/27/18 0710  NA 136 141 139 138 138 139 139  K 3.5 2.9* 3.1* 3.0* 3.4* 4.0 3.9  CL 98 107  --   --  104 102 100  CO2 26 26  --   --  _0 GLUCOSE 185* 57*  --  112* 206* 186* 229*  BUN <5* <5*  --   --  <5* <5* 7  CREATININE 0.86 0.75  --   --  0.66 0.65 0.71  CALCIUM 8.2* 8.2*  --   --  8.2* 8.7* 8.5*  MG  --   --   --   --  1.6* 1.7 1.8   Liver Function Tests: Recent Labs  Lab 11/22/18 1730  AST 152*  ALT 70*  ALKPHOS 160*  BILITOT 1.1  PROT 6.4*  ALBUMIN 3.0*   Recent Labs  Lab 11/22/18 1730  LIPASE 16   No results for input(s): AMMONIA in the last 168 hours. CBC: Recent Labs  Lab 11/22/18 1730 11/23/18 0045 11/24/18 0327 11/24/18 1458 11/24/18 1607 11/26/18 0735  WBC 4.7 5.2 5.2  --   --  8.4  NEUTROABS 3.7  --  2.4  --   --  6.9  HGB 12.4* 11.8* 10.6* 10.2* 9.5* 11.3*  HCT 37.4* 34.4* 32.4* 30.0* 28.0* 34.1*  MCV 90.3 89.1 93.1  --   --  90.9  PLT 433* 387 346  --   --  327   Cardiac Enzymes: No results for input(s): CKTOTAL, CKMB, CKMBINDEX, TROPONINI in the last 168 hours. BNP: Invalid input(s): POCBNP CBG: Recent Labs  Lab 11/27/18 0711 11/27/18 1133 11/27/18 1624 11/27/18 2110 11/28/18 0713  GLUCAP 231* 262* 102* 90 183*   D-Dimer No results for input(s): DDIMER in the last 72 hours. Hgb A1c No results for input(s): HGBA1C in the last 72 hours. Lipid Profile No results for input(s): CHOL, HDL, LDLCALC, TRIG, CHOLHDL, LDLDIRECT in the last 72 hours. Thyroid function studies No results for input(s): TSH, T4TOTAL, T3FREE, THYROIDAB in the last 72 hours.  Invalid input(s): FREET3 Anemia work up No results for input(s): VITAMINB12, FOLATE, FERRITIN, TIBC, IRON, RETICCTPCT in the last 72 hours. Urinalysis    Component Value Date/Time   COLORURINE YELLOW 09/11/2018 1750   APPEARANCEUR CLEAR 09/11/2018 1750   LABSPEC 1.025 09/11/2018 1750    PHURINE 5.5 09/11/2018 1750   GLUCOSEU >=500 (A) 09/11/2018 1750   HGBUR NEGATIVE 09/11/2018 1750   BILIRUBINUR NEGATIVE 09/11/2018 [place] 09/11/2018 1750   PROTEINUR NEGATIVE 09/11/2018 1750  UROBILINOGEN 0.2 11/17/2014 1315   NITRITE NEGATIVE 09/11/2018 1750   LEUKOCYTESUR NEGATIVE 09/11/2018 1750   Sepsis Labs Invalid input(s): PROCALCITONIN,  WBC,  LACTICIDVEN Microbiology Recent Results (from the past 240 hour(s))  SARS Coronavirus 2 (Performed in [place] hospital lab)     Status: None   Collection Time: 11/22/18  5:35 PM   Specimen: Nasopharyngeal Swab  Result Value Ref Range Status   SARS Coronavirus 2 NEGATIVE NEGATIVE Final    Comment: (NOTE) If result is NEGATIVE SARS-CoV-2 target nucleic acids are NOT DETECTED. The SARS-CoV-2 RNA is generally detectable in upper and lower  respiratory specimens during the acute phase of infection. The lowest  concentration of SARS-CoV-2 viral copies this assay can detect is 250  copies / mL. A negative result does not preclude SARS-CoV-2 infection  and should not be used as the sole basis for treatment or other  patient management decisions.  A negative result may occur with  improper specimen collection / handling, submission of specimen other  than nasopharyngeal swab, presence of viral mutation(s) within the  areas targeted by this assay, and inadequate number of viral copies  (<250 copies / mL). A negative result must be combined with clinical  observations, patient history, and epidemiological information. If result is POSITIVE SARS-CoV-2 target nucleic acids are DETECTED. The SARS-CoV-2 RNA is generally detectable in upper and lower  respiratory specimens dur ing the acute phase of infection.  Positive  results are indicative of active infection with SARS-CoV-2.  Clinical  correlation with patient history and other diagnostic information is  necessary to determine patient infection status.  Positive  results do  not rule out bacterial infection or co-infection with other viruses. If result is PRESUMPTIVE POSTIVE SARS-CoV-2 nucleic acids MAY BE PRESENT.   A presumptive positive result was obtained on the submitted specimen  and confirmed on repeat testing.  While 2019 novel coronavirus  (SARS-CoV-2) nucleic acids may be present in the submitted sample  additional confirmatory testing may be necessary for epidemiological  and / or clinical management purposes  to differentiate between  SARS-CoV-2 and other Sarbecovirus currently known to infect humans.  If clinically indicated additional testing with an alternate test  methodology [phone]) is advised. The SARS-CoV-2 RNA is generally  detectable in upper and lower respiratory sp ecimens during the acute  phase of infection. The expected result is Negative. Fact Sheet for Patients:  StrictlyIdeas.no Fact Sheet for Healthcare Providers: BankingDealers.co.za This test is not yet approved or cleared by the [place] FDA and has been authorized for detection and/or diagnosis of SARS-CoV-2 by FDA under an Emergency Use Authorization (EUA).  This EUA will remain in effect (meaning this test can be used) for the duration of the COVID-19 declaration under Section 564(b)(1) of the Act, 21 U.S.C. section 360bbb-3(b)(1), unless the authorization is terminated or revoked sooner. Performed at [facility], [address], [place], [place] [address]   Surgical PCR screen     Status: Abnormal   Collection Time: 11/24/18  7:57 AM   Specimen: Nasal Mucosa; Nasal Swab  Result Value Ref Range Status   MRSA, PCR NEGATIVE NEGATIVE Final   Staphylococcus aureus POSITIVE (A) NEGATIVE Final    Comment: (NOTE) The Xpert SA Assay (FDA approved for NASAL specimens in patients 47 years of age and older), is one component of a comprehensive surveillance program. It is not intended to diagnose  infection nor to guide or monitor treatment. Performed at [facility],  [address] [place], [place] [address]      Time coordinating discharge: 45 minutes  SIGNED:   Tawni Millers, MD  [facility] 11/28/2018, 10:12 AM

## 2018-11-28 NOTE — Progress Notes (Signed)
Neurosurgery Service Progress Note  Subjective: No acute events overnight, some headaches, no N/V   Objective: Vitals:   11/28/18 0607 11/28/18 0612 11/28/18 0700 11/28/18 0800  BP: (!) 131/94  132/90 (!) 127/102  Pulse: 89 87 85 91  Resp: (!) 24 16 12 11   Temp:    98.3 F (36.8 C)  TempSrc:    Oral  SpO2: 97% 100% 98% 100%  Weight:      Height:       Temp (24hrs), Avg:98.6 F (37 C), Min:97.5 F (36.4 C), Max:99.3 F (37.4 C)  CBC Latest Ref Rng & Units 11/26/2018 11/24/2018 11/24/2018  WBC 4.0 - 10.5 K/uL 8.4 - -  Hemoglobin 13.0 - 17.0 g/dL 11.3(L) 9.5(L) 10.2(L)  Hematocrit 39.0 - 52.0 % 34.1(L) 28.0(L) 30.0(L)  Platelets 150 - 400 K/uL 327 - -   BMP Latest Ref Rng & Units 11/27/2018 11/26/2018 11/25/2018  Glucose 70 - 99 mg/dL 229(H) 186(H) 206(H)  BUN 6 - 20 mg/dL 7 <5(L) <5(L)  Creatinine 0.61 - 1.24 mg/dL 0.71 0.65 0.66  Sodium 135 - 145 mmol/L 139 139 138  Potassium 3.5 - 5.1 mmol/L 3.9 4.0 3.4(L)  Chloride 98 - 111 mmol/L 100 102 104  CO2 22 - 32 mmol/L 31 29 24   Calcium 8.9 - 10.3 mg/dL 8.5(L) 8.7(L) 8.2(L)    Intake/Output Summary (Last 24 hours) at 11/28/2018 0950 Last data filed at 11/28/2018 0700 Gross per 24 hour  Intake 554.95 ml  Output 1030 ml  Net -475.05 ml    Current Facility-Administered Medications:  .  acetaminophen (TYLENOL) tablet 650 mg, 650 mg, Oral, Q4H PRN, 650 mg at 11/28/18 0820 **OR** acetaminophen (TYLENOL) suppository 650 mg, 650 mg, Rectal, Q4H PRN, Erline Levine, MD .  amLODipine (NORVASC) tablet 10 mg, 10 mg, Oral, Daily, Arrien, Jimmy Picket, MD, 10 mg at 11/28/18 0820 .  bisacodyl (DULCOLAX) suppository 10 mg, 10 mg, Rectal, Daily PRN, Erline Levine, MD .  Chlorhexidine Gluconate Cloth 2 % PADS 6 each, 6 each, Topical, Daily, Erline Levine, MD, 6 each at 11/27/18 1619 .  docusate sodium (COLACE) capsule 100 mg, 100 mg, Oral, BID, Erline Levine, MD, 100 mg at 11/28/18 0820 .  escitalopram (LEXAPRO) tablet 20 mg, 20 mg, Oral,  Daily, Erline Levine, MD, 20 mg at 11/28/18 0820 .  feeding supplement (ENSURE ENLIVE) (ENSURE ENLIVE) liquid 237 mL, 237 mL, Oral, BID BM, Erline Levine, MD, 237 mL at 11/28/18 0938 .  folic acid (FOLVITE) tablet 1 mg, 1 mg, Oral, Daily, Arrien, Jimmy Picket, MD, 1 mg at 11/28/18 0820 .  hydrALAZINE (APRESOLINE) injection 5 mg, 5 mg, Intravenous, Q2H PRN, Erline Levine, MD, 5 mg at 11/24/18 2217 .  HYDROcodone-acetaminophen (NORCO/VICODIN) 5-325 MG per tablet 1 tablet, 1 tablet, Oral, Q4H PRN, Erline Levine, MD, 1 tablet at 11/28/18 [phone] .  hydrOXYzine (ATARAX/VISTARIL) tablet 10 mg, 10 mg, Oral, TID PRN, Erline Levine, MD, 10 mg at 11/25/18 2035 .  hydrOXYzine (VISTARIL) injection 25 mg, 25 mg, Intramuscular, Q6H PRN, Erline Levine, MD, 25 mg at 11/24/18 1945 .  insulin aspart (novoLOG) injection 0-15 Units, 0-15 Units, Subcutaneous, TID WC, Erline Levine, MD, 3 Units at 11/28/18 0745 .  insulin glargine (LANTUS) injection 15 Units, 15 Units, Subcutaneous, Daily, Arrien, Jimmy Picket, MD .  labetalol (NORMODYNE) injection 10-40 mg, 10-40 mg, Intravenous, Q10 min PRN, Erline Levine, MD, 10 mg at 11/24/18 2021 .  levETIRAcetam (KEPPRA) IVPB 500 mg/100 mL premix, 500 mg, Intravenous, Q12H, Erline Levine, MD, Stopped at 11/28/18  3614 .  lisinopril (ZESTRIL) tablet 10 mg, 10 mg, Oral, Daily, Arrien, Jimmy Picket, MD, 10 mg at 11/28/18 0820 .  LORazepam (ATIVAN) tablet 1 mg, 1 mg, Oral, Q4H PRN, Arrien, Jimmy Picket, MD, 1 mg at 11/27/18 1542 .  MEDLINE mouth rinse, 15 mL, Mouth Rinse, BID, Erline Levine, MD, 15 mL at 11/28/18 0826 .  morphine 2 MG/ML injection 1-2 mg, 1-2 mg, Intravenous, Q2H PRN, Erline Levine, MD, 1 mg at 11/28/18 0753 .  multivitamin with minerals tablet 1 tablet, 1 tablet, Oral, Daily, Arrien, Jimmy Picket, MD, 1 tablet at 11/28/18 [phone] .  mupirocin ointment (BACTROBAN) 2 % 1 application, 1 application, Nasal, BID, Erline Levine, MD, 1 application at 40/08/67 [phone] .   oxyCODONE (Oxy IR/ROXICODONE) immediate release tablet 5 mg, 5 mg, Oral, Q6H PRN, Erline Levine, MD, 5 mg at 11/28/18 0356 .  pantoprazole (PROTONIX) injection 40 mg, 40 mg, Intravenous, QHS, Erline Levine, MD, 40 mg at 11/26/18 2134 .  polyethylene glycol (MIRALAX / GLYCOLAX) packet 17 g, 17 g, Oral, Daily PRN, Erline Levine, MD .  promethazine (PHENERGAN) injection 25 mg, 25 mg, Intravenous, Q6H PRN, Meyran, Ocie Cornfield, NP, 25 mg at 11/26/18 0507 .  promethazine (PHENERGAN) tablet 12.5-25 mg, 12.5-25 mg, Oral, Q4H PRN, Erline Levine, MD, 25 mg at 11/27/18 1625 .  QUEtiapine (SEROQUEL) tablet 300 mg, 300 mg, Oral, QHS, Erline Levine, MD, 300 mg at 11/27/18 2119 .  sodium phosphate (FLEET) 7-19 GM/118ML enema 1 enema, 1 enema, Rectal, Once PRN, Erline Levine, MD .  thiamine (VITAMIN B-1) tablet 100 mg, 100 mg, Oral, Daily, 100 mg at 11/28/18 0821 **OR** thiamine (B-1) injection 100 mg, 100 mg, Intravenous, Daily, Arrien, Jimmy Picket, MD .  zolpidem Lorrin Mais) tablet 5 mg, 5 mg, Oral, QHS PRN, Erline Levine, MD, 5 mg at 11/27/18 2118   Physical Exam: AOx3, PERRL, EOMI, FS, Strength 5/5 x4, SILTx4 Incisions w/ dressings c/d/i bilaterally  Assessment & Plan: 43 y.o. man s/p b/l evacuation of SDH, recovering well. -okay for discharge today from a neurosurgical perspective, pt will need to follow up with Dr. Vertell Limber in 2 weeks, he can call the office at [phone] to make his post-op follow up appointment  [place]  11/28/18 9:50 AM

## 2018-11-28 NOTE — Plan of Care (Signed)
  Problem: Education: Goal: Knowledge of General Education information will improve Description: Including pain rating scale, medication(s)/side effects and non-pharmacologic comfort measures Outcome: Progressing   Problem: Health Behavior/Discharge Planning: Goal: Ability to manage health-related needs will improve Outcome: Progressing   Problem: Clinical Measurements: Goal: Ability to maintain clinical measurements within normal limits will improve Outcome: Progressing   Problem: Activity: Goal: Risk for activity intolerance will decrease Outcome: Progressing   Problem: Nutrition: Goal: Adequate nutrition will be maintained Outcome: Completed/Met   Problem: Elimination: Goal: Will not experience complications related to bowel motility Outcome: Completed/Met Goal: Will not experience complications related to urinary retention Outcome: Completed/Met   Problem: Pain Managment: Goal: General experience of comfort will improve Outcome: Progressing Note: Patient states his pain is an 8/10 despite current regimen. Patient educated that he will not be given IV morphine upon discharge. Patient states he will manage with the PO pain medication.

## 2018-11-28 NOTE — Progress Notes (Signed)
CSW received consult for patient for current substance use. There are no toxicology results in the patient's chart relevant to this admission. Per recent neurology note, patient to be discharged today with outpatient medical follow up.  Please consult CSW if needs arise.  Madilyn Fireman, MSW, LCSW-A Clinical Social Worker [facility] [phone]

## 2018-11-28 NOTE — Plan of Care (Signed)
  Problem: Education: Goal: Knowledge of General Education information will improve Description: Including pain rating scale, medication(s)/side effects and non-pharmacologic comfort measures 11/28/2018 1150 by Ames Dura, RN Outcome: Completed/Met 11/28/2018 0847 by Ames Dura, RN Outcome: Progressing   Problem: Health Behavior/Discharge Planning: Goal: Ability to manage health-related needs will improve 11/28/2018 1150 by Ames Dura, RN Outcome: Completed/Met 11/28/2018 0847 by Ames Dura, RN Outcome: Progressing   Problem: Clinical Measurements: Goal: Ability to maintain clinical measurements within normal limits will improve 11/28/2018 1150 by Ames Dura, RN Outcome: Completed/Met 11/28/2018 0847 by Ames Dura, RN Outcome: Progressing Goal: Will remain free from infection Outcome: Completed/Met Goal: Diagnostic test results will improve Outcome: Completed/Met Goal: Respiratory complications will improve Outcome: Completed/Met Goal: Cardiovascular complication will be avoided Outcome: Completed/Met   Problem: Activity: Goal: Risk for activity intolerance will decrease 11/28/2018 1150 by Ames Dura, RN Outcome: Completed/Met 11/28/2018 0847 by Ames Dura, RN Outcome: Progressing   Problem: Nutrition: Goal: Adequate nutrition will be maintained Outcome: Completed/Met   Problem: Coping: Goal: Level of anxiety will decrease Outcome: Completed/Met   Problem: Elimination: Goal: Will not experience complications related to bowel motility Outcome: Completed/Met Goal: Will not experience complications related to urinary retention Outcome: Completed/Met   Problem: Pain Managment: Goal: General experience of comfort will improve 11/28/2018 1150 by Ames Dura, RN Outcome: Completed/Met 11/28/2018 0847 by Ames Dura, RN Outcome: Progressing Note: Patient states his pain is an 8/10 despite current regimen.  Patient educated that he will not be given IV morphine upon discharge. Patient states he will manage with the PO pain medication.    Problem: Safety: Goal: Ability to remain free from injury will improve Outcome: Completed/Met   Problem: Skin Integrity: Goal: Risk for impaired skin integrity will decrease Outcome: Completed/Met

## 2018-11-28 NOTE — Discharge Instructions (Signed)
Head Injury, Adult There are many types of head injuries. They can be as minor as a bump. Some head injuries can be worse. Worse injuries include:  A strong hit to the head that shakes the brain back and forth causing damage (concussion).  A bruise (contusion) of the brain. This means there is bleeding in the brain that can cause swelling.  A cracked skull (skull fracture).  Bleeding in the brain that gathers, gets thick (makes a clot), and forms a bump (hematoma). Most problems from a head injury come in the first 24 hours. However, you may still have side effects up to 7-10 days after your injury. It is important to watch your condition for any changes. You may need to be watched in the emergency department or urgent care, or you may need to stay in the hospital. What are the causes? There are many possible causes of a head injury. A serious head injury may be caused by:  A car accident.  Bicycle or motorcycle accidents.  Sports injuries.  Falls. What are the signs or symptoms? Symptoms of a head injury include a bruise, bump, or bleeding where the injury happened. Other physical symptoms may include:  Headache.  Feeling sick to your stomach (nauseous) or vomiting.  Dizziness.  Feeling tired.  Being uncomfortable around bright lights or loud noises.  Shaking movements that you cannot control (seizures).  Trouble being woken up.  Passing out (fainting). Mental or emotional symptoms may include:  Feeling grumpy or cranky.  Confusion and memory problems.  Having trouble paying attention or concentrating.  Changes in eating or sleeping habits.  Feeling worried or nervous (anxious).  Feeling sad (depressed). How is this treated? Treatment for this condition depends on how severe the injury is and the type of injury you have. The main goal is to prevent complications and to allow the brain time to heal. Mild head injury If you have a mild head injury, you may be  sent home and treatment may include:  Being watched. A responsible adult should stay with you for 24 hours after your injury and check on you often.  Physical rest.  Brain rest.  Pain medicines. Severe head injury If you have a severe head injury, treatment may include:  Being watched closely. This includes hospitalization with frequent physical exams.  Medicines to: ? Help with pain. ? Prevent shaking movements that you cannot control. ? Help with brain swelling.  Using a machine that helps you breathe (ventilator).  Treatments to manage the swelling inside the brain.  Brain surgery. This may be needed to: ? Remove a blood clot. ? Stop the bleeding. ? Remove a part of the skull. This allows room for the brain to swell. Follow these instructions at home: Activity  Rest.  Avoid activities that are hard or tiring.  Make sure you get enough sleep.  Limit activities that need a lot of thought or attention, such as: ? Watching TV. ? Playing memory games and puzzles. ? Job-related work or homework. ? Working on [employer], and texting.  Avoid activities that could cause another head injury until your doctor says it is okay. This includes playing sports. Having another head injury, especially before the first one has healed, can be dangerous.  Ask your doctor when it is safe for you to go back to your normal activities, such as work or school. Ask your doctor for a step-by-step plan for slowly going back to your normal activities.  Ask  your doctor when you can drive, ride a bicycle, or use heavy machinery. Do not do these activities if you are dizzy. Lifestyle   Do not drink alcohol until your doctor says it is okay.  Do not use drugs.  If it is harder than usual to remember things, write them down.  If you are easily distracted, try to do one thing at a time.  Talk with family members or close friends when making important decisions.  Tell your  friends, family, a trusted coworker, and work Freight forwarder about your injury, symptoms, and limits (restrictions). Have them watch for any problems that are new or getting worse. General instructions  Take over-the-counter and prescription medicines only as told by your doctor.  Have someone stay with you for 24 hours after your head injury. This person should watch you for any changes in your symptoms and be ready to get help.  Keep all follow-up visits as told by your doctor. This is important. How is this prevented?  Work on [employer]. This can help you avoid falls.  Wear a seatbelt when you are in a moving vehicle.  Wear a helmet when you: ? Ride a bicycle. ? Ski. ? Do any other sport or activity that has a risk of injury.  If you drink alcohol: ? Limit how much you use to: ? 0-1 drink a day for women. ? 0-2 drinks a day for men. ? Be aware of how much alcohol is in your drink. In the [place] one drink equals one 12 oz bottle of beer (355 mL), one 5 oz glass of wine (148 mL), or one 1 oz glass of hard liquor (44 mL).  Make your home safer by: ? Getting rid of clutter from the floors and stairs. This includes things that can make you trip. ? Using grab bars in bathrooms and handrails by stairs. ? Placing non-slip mats on floors and in bathtubs. ? Putting more light in dim areas. Get help right away if:  You have: ? A very bad headache that is not helped by medicine. ? Trouble walking or weakness in your arms and legs. ? Clear or bloody fluid coming from your nose or ears. ? Changes in how you see (vision). ? Shaking movements that you cannot control.  You lose your balance.  You vomit.  The black centers of your eyes (pupils) change in size.  Your speech is slurred.  Your dizziness gets worse.  You pass out.  You are sleepier than normal and have trouble staying awake.  Your symptoms get worse. These symptoms may be an emergency. Do not wait to see  if the symptoms will go away. Get medical help right away. Call your local emergency services (911 in the [place] Do not drive yourself to the hospital. Summary  There are many types of head injuries. They can be as minor as a bump. Some head injuries can be worse  Treatment for this condition depends on how severe the injury is and the type of injury you have.  Ask your doctor when it is safe for you to go back to your normal activities, such as work or school.  To prevent a head injury, wear a seat belt in a car, wear a helmet when you use a a bicycle, limit your alcohol use, and make your home safer. This information is not intended to replace advice given to you by your health care provider. Make sure you discuss any questions you  have with your health care provider. Document Released: 03/28/2008 Document Revised: 08/06/2018 Document Reviewed: 05/08/2018 Elsevier Patient Education  2020 [employer].

## 2018-11-28 NOTE — Evaluation (Signed)
Physical Therapy Evaluation Patient Details Name: Willie [name] MRN: [id number] DOB: [birth year] Today's Date: 11/28/2018   History of Present Illness  Pt is a 43 y.o. M with significant PMH of hypertension, type 2 diabetes mellitus, depression, anxiety, history of alcohol abuse. He was diagnosed with subdural hematoma 11/14/2018 and left AMA in hospital in [place]. Now admitted with persistent symptoms of headache, nausea, and vomiting. Head CT with left frontoparietal subdural hematoma measuring 14 mm thick, exerting mass effect upon the left hemisphere. Right frontoparietal subdural hematoma up to 8 mm thick with mild mass effect of the right hemisphere. S/p bilateral craniotomy and evacuation.     Clinical Impression  Patient evaluated by Physical Therapy with no further acute PT needs identified. Pt ambulating block around unit without physical difficulty. Negotiated 10 steps with railing at a supervision level. Scoring 22/24 on Dynamic Gait Index indicating he is not at high risk for falls. Noted mild decreased short term memory recall. All education has been completed and the patient has no further questions. No follow-up Physical Therapy or equipment needs. PT is signing off. Thank you for this referral.     Follow Up Recommendations No PT follow up    Equipment Recommendations  None recommended by PT    Recommendations for Other Services       Precautions / Restrictions Precautions Precautions: None Restrictions Weight Bearing Restrictions: No      Mobility  Bed Mobility Overal bed mobility: Independent                Transfers Overall transfer level: Independent Equipment used: None                Ambulation/Gait Ambulation/Gait assistance: Independent Gait Distance (Feet): 300 Feet Assistive device: None Gait Pattern/deviations: WFL(Within Functional Limits)        Stairs Stairs: Yes Stairs assistance: Supervision Stair Management: One rail  Left Number of Stairs: 10 General stair comments: Pulling up on rail to ascend, step over step pattern. Supervision for safety  Wheelchair Mobility    Modified Rankin (Stroke Patients Only) Modified Rankin (Stroke Patients Only) Pre-Morbid Rankin Score: No symptoms Modified Rankin: Slight disability     Balance                                 Standardized Balance Assessment Standardized Balance Assessment : Dynamic Gait Index   Dynamic Gait Index Level Surface: Normal Change in Gait Speed: Normal Gait with Horizontal Head Turns: Mild Impairment Gait with Vertical Head Turns: Normal Gait and Pivot Turn: Normal Step Over Obstacle: Normal Step Around Obstacles: Normal Steps: Mild Impairment Total Score: 22       Pertinent Vitals/Pain Pain Assessment: Faces Faces Pain Scale: Hurts a little bit Pain Location: headache Pain Intervention(s): Monitored during session    Home Living Family/patient expects to be discharged to:: Private residence Living Arrangements: Spouse/significant other(fiance) Available Help at Discharge: Family Type of Home: Apartment Home Access: Stairs to enter   Technical brewer of Steps: (flight) Home Layout: One level        Prior Function Level of Independence: Independent         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, newspaper        Extremity/Trunk Assessment   Upper Extremity Assessment Upper Extremity Assessment: Overall WFL for tasks assessed    Lower Extremity Assessment Lower Extremity Assessment: Overall WFL for tasks assessed  Cervical / Trunk Assessment Cervical / Trunk Assessment: Normal  Communication   Communication: No difficulties  Cognition Arousal/Alertness: Awake/alert Behavior During Therapy: WFL for tasks assessed/performed Overall Cognitive Status: Impaired/Different from baseline Area of Impairment: Memory                     Memory: Decreased short-term memory          General Comments: A&Ox4, good working memory, short term recall 1/3 but able to achieve 3/3 with simple cueing. Able to way find in hallway.       General Comments      Exercises     Assessment/Plan    PT Assessment Patent does not need any further PT services  PT Problem List         PT Treatment Interventions      PT Goals (Current goals can be found in the Care Plan section)  Acute Rehab PT Goals Patient Stated Goal: none stated; agreeable for therapy PT Goal Formulation: All assessment and education complete, DC therapy    Frequency     Barriers to discharge        Co-evaluation               AM-PAC PT "6 Clicks" Mobility  Outcome Measure Help needed turning from your back to your side while in a flat bed without using bedrails?: None Help needed moving from lying on your back to sitting on the side of a flat bed without using bedrails?: None Help needed moving to and from a bed to a chair (including a wheelchair)?: None Help needed standing up from a chair using your arms (e.g., wheelchair or bedside chair)?: None Help needed to walk in hospital room?: None Help needed climbing 3-5 steps with a railing? : None 6 Click Score: 24    End of Session   Activity Tolerance: Patient tolerated treatment well Patient left: in bed;with call bell/phone within reach Nurse Communication: Mobility status PT Visit Diagnosis: Unsteadiness on feet (R26.81)    Time: 7680-8811 PT Time Calculation (min) (ACUTE ONLY): 15 min   Charges:   PT Evaluation $PT Eval Moderate Complexity: 1 Mod          Ellamae Sia, PT, DPT Acute Rehabilitation Services Pager [phone] Office [phone]   [name] 11/28/2018, 11:57 AM

## 2018-12-02 ENCOUNTER — Telehealth: Payer: Self-pay

## 2018-12-02 ENCOUNTER — Other Ambulatory Visit: Payer: Self-pay

## 2018-12-02 NOTE — Telephone Encounter (Signed)
This needs to be a face to face office visit. Patient can be seen Friday the 7th at 3:40. Please ask Anderson Malta to enter appointment if you are not able to. Thanks.

## 2018-12-04 ENCOUNTER — Other Ambulatory Visit: Payer: Self-pay

## 2018-12-04 ENCOUNTER — Ambulatory Visit (INDEPENDENT_AMBULATORY_CARE_PROVIDER_SITE_OTHER): Payer: No Typology Code available for payment source | Admitting: Family

## 2018-12-04 VITALS — BP 120/80 | HR 114 | Temp 98.8°F | Resp 16 | Wt 185.0 lb

## 2018-12-04 DIAGNOSIS — F419 Anxiety disorder, unspecified: Secondary | ICD-10-CM

## 2018-12-04 DIAGNOSIS — E876 Hypokalemia: Secondary | ICD-10-CM | POA: Diagnosis not present

## 2018-12-04 DIAGNOSIS — S065XAA Traumatic subdural hemorrhage with loss of consciousness status unknown, initial encounter: Secondary | ICD-10-CM

## 2018-12-04 DIAGNOSIS — S065X9A Traumatic subdural hemorrhage with loss of consciousness of unspecified duration, initial encounter: Secondary | ICD-10-CM | POA: Diagnosis not present

## 2018-12-04 DIAGNOSIS — F32A Depression, unspecified: Secondary | ICD-10-CM

## 2018-12-04 DIAGNOSIS — F1010 Alcohol abuse, uncomplicated: Secondary | ICD-10-CM

## 2018-12-04 DIAGNOSIS — I10 Essential (primary) hypertension: Secondary | ICD-10-CM

## 2018-12-04 DIAGNOSIS — F329 Major depressive disorder, single episode, unspecified: Secondary | ICD-10-CM

## 2018-12-04 MED ORDER — LORAZEPAM 1 MG PO TABS: 1.0000 mg | ORAL_TABLET | Freq: Three times a day (TID) | ORAL | 0 refills | Status: DC | PRN

## 2018-12-04 MED ORDER — INSULIN LISPRO (1 UNIT DIAL) 100 UNIT/ML (KWIKPEN): PEN_INJECTOR | SUBCUTANEOUS | 2 refills | Status: DC

## 2018-12-04 MED ORDER — INSULIN LISPRO (1 UNIT DIAL) 100 UNIT/ML (KWIKPEN): 5.0000 [IU] | PEN_INJECTOR | Freq: Three times a day (TID) | SUBCUTANEOUS | 2 refills | Status: DC

## 2018-12-04 MED ORDER — QUETIAPINE FUMARATE 300 MG PO TABS: 300.0000 mg | ORAL_TABLET | Freq: Every day | ORAL | 0 refills | Status: DC

## 2018-12-04 MED ORDER — PANTOPRAZOLE SODIUM 40 MG PO TBEC: 40.0000 mg | DELAYED_RELEASE_TABLET | Freq: Two times a day (BID) | ORAL | 5 refills | Status: DC

## 2018-12-04 MED ORDER — LANTUS SOLOSTAR 100 UNIT/ML ~~LOC~~ SOPN: 20.0000 [IU] | PEN_INJECTOR | Freq: Every day | SUBCUTANEOUS | 2 refills | Status: DC

## 2018-12-04 NOTE — Progress Notes (Signed)
Subjective:    Patient ID: Willie [name], male    DOB: [birth year], 43 y.o.   MRN: [id number]  HPI  Patient is a 43 yr old male who presents today for hospital follow up.  Pt was admitted 11/22/18 through 11/28/18.  This was following a bilateral  SDH that he sustained on 7/18 while in [place].  He left the hospital in [facility] (they recommended surgery) and flew home.  He presented to our local ER with c/o HA, nausea and vomiting. He underwent bilateral craniotimy with SD evacuation on 11/24/18.   He is maintained on keppra for seizure prophylaxis.  HTN- he was resumed on home amlodipine/lisinopril.  BP Readings from Last 3 Encounters:  12/04/18 120/80  11/28/18 (!) 127/102  09/15/18 130/89   Prolonged QT/hypokalemia/hypomagnesemia- LVEF was 45-50%.   ETOH abuse- pt developed withdrawal symptoms and wast treated with CIWA protocol. He reports that he has not had any alcohol since returning home.  Reports that he does continue to have some intermittent withdrawal symptoms sometimes when his xanax wears off.    Depression- maintained on lexapro, seroquel. Reports mood is good. Does have some issues with insomnia.  Requesting refill of ambien.    Review of Systems See HPI  Past Medical History:  Diagnosis Date  . Alcohol abuse   . Alcoholic pancreatitis    recurrent  . Depression   . Diabetes mellitus, type II ([place])    New Onset 03/2010  . GERD (gastroesophageal reflux disease)   . High cholesterol   . 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 Hypertension   . Sleep apnea    does not wear a CPAP     Social History   Socioeconomic History  . Marital status: Married    Spouse name: Not on file  . Number of children: 2  . Years of education: Not on file  . Highest education level: Not on file  Occupational History  . Occupation: Landscape architect: [employer]  .  Financial resource strain: Not on file  . Food insecurity    Worry: Not on file    Inability: Not on file  . Transportation needs    Medical: Not on file    Non-medical: Not on file  Tobacco Use  . Smoking status: Former Smoker    Packs/day: 0.25    Years: 1.00    Pack years: 0.25    Types: Cigarettes    Start date: 04/29/2016  . Smokeless tobacco: Never Used  . Tobacco comment: 4 Cigarettes a day   Substance and Sexual Activity  . Alcohol use: Yes    Comment: 12 pack beer a day  . Drug use: Not Currently    Comment: none in years  . Sexual activity: Not on file  Lifestyle  . Physical activity    Days per week: Not on file    Minutes per session: Not on file  . Stress: Not on file  Relationships  . Social Herbalist on phone: Not on file    Gets together: Not on file    Attends religious service: Not on file    Active member of club or organization: Not on file    Attends meetings of clubs or organizations: Not on file    Relationship status: Not on file  . Intimate partner violence  Fear of current or ex partner: Not on file    Emotionally abused: Not on file    Physically abused: Not on file    Forced sexual activity: Not on file  Other Topics Concern  . 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 COLONOSCOPY W/ BIOPSIES  09/2010   Dr. Ardis Hughs.  for intermittent rectal bleeding.  Mild sigmoid to descending diverticulosis.  Mild left colon erythema, benign biopsy, probably "prep effect"  . COLONOSCOPY WITH PROPOFOL N/A 07/06/2017   Procedure: COLONOSCOPY WITH PROPOFOL;  Surgeon: Jerene Bears, MD;  Location: [facility];  Service: Gastroenterology;  Laterality: N/A;  . CRANIOTOMY Bilateral 11/24/2018   Procedure: Bilateral  craniotomy for evacuation of subdural hematoma;  Surgeon: Erline Levine, MD;  Location: [facility];  Service: Neurosurgery;  Laterality: Bilateral;  Bilateral craniotomy for evacuation of subdural hematoma  . LUMBAR MICRODISCECTOMY  ~ 2004   Dr Ellene Route    Family History  Problem Relation Age of Onset  . Diabetes Mother   . Lung cancer Brother        twin brother  . Pancreatic cancer Paternal Aunt   . Colon cancer Neg Hx   . Stomach cancer Neg Hx     Allergies  Allergen Reactions  . Invokamet [Canagliflozin-Metformin Hcl] Other (See Comments)    Lactic acidosis  . Metformin And Related Other (See Comments)    DRASTIC drop in blood sugar    Current Outpatient Medications on File Prior to Visit  Medication Sig Dispense Refill  . amLODipine (NORVASC) 10 MG tablet Take 1 tablet (10 mg total) by mouth daily. 30 tablet 0  . blood glucose meter kit and supplies KIT Dispense based on patient and insurance preference. Use up to four times daily as directed. (FOR ICD-9 250.00, 250.01). 1 each 0  . CIALIS 10 MG tablet TAKE 1 TABLET BY MOUTH DAILY AS NEEDED FOR ERECTILE DYSFUNCTION (Patient taking differently: Take 10 mg by mouth daily as needed for erectile dysfunction. ) 10 tablet 5  . Continuous Blood Gluc Sensor (FREESTYLE LIBRE 14 DAY SENSOR) MISC Inject 1 each into the skin every 14 (fourteen) days. 6 each 3  . escitalopram (LEXAPRO) 20 MG tablet Take 1 tablet (20 mg total) by mouth daily. 90 tablet 0  . glucose blood (FREESTYLE LITE) test strip Use as instructed 100 each 12  . ibuprofen (ADVIL) 400 MG tablet Take 1 tablet (400 mg total) by mouth every 6 (six) hours as needed for moderate pain. 30 tablet 0  . Insulin Pen Needle (ULTICARE SHORT PEN NEEDLES) 31G X 8 MM MISC Use once daily to inject insulin. 100 each 1  . Lancets (FREESTYLE) lancets 2 (two) times daily 200 each 5  . levETIRAcetam (KEPPRA) 500 MG tablet Take 1 tablet (500 mg total) by mouth 2 (two) times daily for 14 days. 28 tablet  0  . Multiple Vitamins-Minerals (MULTIVITAMIN) tablet Take 1 tablet by mouth daily. 30 tablet 0  . ondansetron (ZOFRAN ODT) 4 MG disintegrating tablet 46m ODT q4 hours prn nausea/vomit (Patient taking differently: Take 4 mg by mouth every 4 (four) hours as needed for nausea or vomiting. ) 4  tablet 0  . oxyCODONE (ROXICODONE) 5 MG immediate release tablet Take 1 tablet (5 mg total) by mouth every 4 (four) hours as needed (pain). 30 tablet 0  . sildenafil (REVATIO) 20 MG tablet Take 1-2 tabs by mouth once daily as needed 30-60 minutes prior to sexual activity. 50 tablet 0   No current facility-administered medications on file prior to visit.     BP 120/80 (BP Location: Right Arm, Patient Position: Sitting, Cuff Size: Normal)   Pulse (!) 114   Temp 98.8 F (37.1 C) (Oral)   Resp 16   Wt 185 lb (83.9 kg)   SpO2 98%   BMI 25.08 kg/m       Objective:   Physical Exam Constitutional:      General: He is not in acute distress.    Appearance: He is well-developed.  HENT:     Head:     Comments: Two scalp incisions with staples- clean dry and intact Cardiovascular:     Rate and Rhythm: Normal rate and regular rhythm.     Heart sounds: No murmur.  Pulmonary:     Effort: Pulmonary effort is normal. No respiratory distress.     Breath sounds: Normal breath sounds. No wheezing or rales.  Musculoskeletal:     Comments: 3+ bilateral LE edema  Skin:    General: Skin is warm and dry.  Neurological:     Mental Status: He is alert and oriented to person, place, and time.  Psychiatric:        Behavior: Behavior normal.        Thought Content: Thought content normal.           Assessment & Plan:  SDH- status post craniotomy. Clinically stable.  Advised pt to arrange follow up with his neurosurgeon. Maintained on keppra.   Anxiety/Depression- stable on current meds.  Continue same.  ETOH abuse- d/c xanax, change to prn ativan to help decrease withdrawal symptoms. Advised pt that I  would not like him to restart ambien with his other meds at this time but he can take a tablet of ativan at bedtime as needed for insomnia. Discussed that his body is starting to show signs of liver damage including his low albumin level which is contributing to his lower extremity edema.   Hypomagnesemia/hypokalemia- check follow up K+ and Mag.   HTN- bp stable on amlodipine. Continue same.

## 2018-12-04 NOTE — Patient Instructions (Signed)
Please complete lab work prior to leaving.   

## 2018-12-06 LAB — COMPREHENSIVE METABOLIC PANEL
AG Ratio: 1.1 (calc) (ref 1.0–2.5)
ALT: 18 U/L (ref 9–46)
AST: 22 U/L (ref 10–40)
Albumin: 3.2 g/dL — ABNORMAL LOW (ref 3.6–5.1)
Alkaline phosphatase (APISO): 113 U/L (ref 36–130)
BUN/Creatinine Ratio: 12 (calc) (ref 6–22)
BUN: 7 mg/dL (ref 7–25)
CO2: 30 mmol/L (ref 20–32)
Calcium: 8.8 mg/dL (ref 8.6–10.3)
Chloride: 102 mmol/L (ref 98–110)
Creat: 0.58 mg/dL — ABNORMAL LOW (ref 0.60–1.35)
Globulin: 2.8 g/dL (calc) (ref 1.9–3.7)
Glucose, Bld: 65 mg/dL (ref 65–99)
Potassium: 3.8 mmol/L (ref 3.5–5.3)
Sodium: 134 mmol/L — ABNORMAL LOW (ref 135–146)
Total Bilirubin: 0.5 mg/dL (ref 0.2–1.2)
Total Protein: 6 g/dL — ABNORMAL LOW (ref 6.1–8.1)

## 2018-12-06 LAB — PAIN MGMT, PROFILE 8 W/CONF, U
6 Acetylmorphine: NEGATIVE ng/mL
Alcohol Metabolites: NEGATIVE ng/mL (ref <500)
Amphetamines: NEGATIVE ng/mL
Benzodiazepines: NEGATIVE ng/mL
Buprenorphine, Urine: NEGATIVE ng/mL
Cocaine Metabolite: NEGATIVE ng/mL
Codeine: NEGATIVE ng/mL
Creatinine: 99.3 mg/dL
Hydrocodone: NEGATIVE ng/mL
Hydromorphone: NEGATIVE ng/mL
MDMA: NEGATIVE ng/mL
Marijuana Metabolite: NEGATIVE ng/mL
Morphine: NEGATIVE ng/mL
Norhydrocodone: NEGATIVE ng/mL
Noroxycodone: 848 ng/mL
Opiates: NEGATIVE ng/mL
Oxidant: NEGATIVE ug/mL
Oxycodone: 133 ng/mL
Oxycodone: POSITIVE ng/mL
Oxymorphone: 60 ng/mL
pH: 6.3 (ref 4.5–9.0)

## 2018-12-06 LAB — MAGNESIUM: Magnesium: 1.5 mg/dL (ref 1.5–2.5)

## 2018-12-19 ENCOUNTER — Inpatient Hospital Stay (HOSPITAL_BASED_OUTPATIENT_CLINIC_OR_DEPARTMENT_OTHER)
Admission: EM | Admit: 2018-12-19 | Discharge: 2018-12-27 | DRG: 064 | Disposition: A | Discharge status: Home / Self Care | Payer: Self-pay | Attending: Neurosurgery | Admitting: Neurosurgery

## 2018-12-19 ENCOUNTER — Other Ambulatory Visit: Payer: Self-pay

## 2018-12-19 ENCOUNTER — Emergency Department (HOSPITAL_BASED_OUTPATIENT_CLINIC_OR_DEPARTMENT_OTHER): Payer: Self-pay

## 2018-12-19 DIAGNOSIS — F329 Major depressive disorder, single episode, unspecified: Secondary | ICD-10-CM | POA: Diagnosis present

## 2018-12-19 DIAGNOSIS — F419 Anxiety disorder, unspecified: Secondary | ICD-10-CM | POA: Diagnosis present

## 2018-12-19 DIAGNOSIS — G936 Cerebral edema: Secondary | ICD-10-CM | POA: Diagnosis present

## 2018-12-19 DIAGNOSIS — E162 Hypoglycemia, unspecified: Secondary | ICD-10-CM

## 2018-12-19 DIAGNOSIS — G8929 Other chronic pain: Secondary | ICD-10-CM | POA: Diagnosis present

## 2018-12-19 DIAGNOSIS — E1165 Type 2 diabetes mellitus with hyperglycemia: Secondary | ICD-10-CM | POA: Diagnosis not present

## 2018-12-19 DIAGNOSIS — R569 Unspecified convulsions: Secondary | ICD-10-CM | POA: Diagnosis present

## 2018-12-19 DIAGNOSIS — Z794 Long term (current) use of insulin: Secondary | ICD-10-CM

## 2018-12-19 DIAGNOSIS — Z79899 Other long term (current) drug therapy: Secondary | ICD-10-CM

## 2018-12-19 DIAGNOSIS — Z87828 Personal history of other (healed) physical injury and trauma: Secondary | ICD-10-CM

## 2018-12-19 DIAGNOSIS — I6201 Nontraumatic acute subdural hemorrhage: Principal | ICD-10-CM | POA: Diagnosis present

## 2018-12-19 DIAGNOSIS — E11649 Type 2 diabetes mellitus with hypoglycemia without coma: Secondary | ICD-10-CM | POA: Diagnosis present

## 2018-12-19 DIAGNOSIS — Z791 Long term (current) use of non-steroidal anti-inflammatories (NSAID): Secondary | ICD-10-CM

## 2018-12-19 DIAGNOSIS — Z800 Family history of malignant neoplasm of digestive organs: Secondary | ICD-10-CM

## 2018-12-19 DIAGNOSIS — IMO0002 Reserved for concepts with insufficient information to code with codable children: Secondary | ICD-10-CM | POA: Diagnosis present

## 2018-12-19 DIAGNOSIS — F10239 Alcohol dependence with withdrawal, unspecified: Secondary | ICD-10-CM | POA: Diagnosis present

## 2018-12-19 DIAGNOSIS — I10 Essential (primary) hypertension: Secondary | ICD-10-CM | POA: Diagnosis present

## 2018-12-19 DIAGNOSIS — S065X9A Traumatic subdural hemorrhage with loss of consciousness of unspecified duration, initial encounter: Secondary | ICD-10-CM | POA: Diagnosis present

## 2018-12-19 DIAGNOSIS — Z833 Family history of diabetes mellitus: Secondary | ICD-10-CM

## 2018-12-19 DIAGNOSIS — S065XAA Traumatic subdural hemorrhage with loss of consciousness status unknown, initial encounter: Secondary | ICD-10-CM | POA: Diagnosis present

## 2018-12-19 DIAGNOSIS — G4733 Obstructive sleep apnea (adult) (pediatric): Secondary | ICD-10-CM | POA: Diagnosis present

## 2018-12-19 DIAGNOSIS — Z801 Family history of malignant neoplasm of trachea, bronchus and lung: Secondary | ICD-10-CM

## 2018-12-19 DIAGNOSIS — Z20828 Contact with and (suspected) exposure to other viral communicable diseases: Secondary | ICD-10-CM | POA: Diagnosis present

## 2018-12-19 DIAGNOSIS — E872 Acidosis: Secondary | ICD-10-CM | POA: Diagnosis present

## 2018-12-19 DIAGNOSIS — K219 Gastro-esophageal reflux disease without esophagitis: Secondary | ICD-10-CM | POA: Diagnosis present

## 2018-12-19 DIAGNOSIS — Z79891 Long term (current) use of opiate analgesic: Secondary | ICD-10-CM

## 2018-12-19 DIAGNOSIS — Z888 Allergy status to other drugs, medicaments and biological substances status: Secondary | ICD-10-CM

## 2018-12-19 DIAGNOSIS — Z87891 Personal history of nicotine dependence: Secondary | ICD-10-CM

## 2018-12-19 LAB — CBG MONITORING, ED
Glucose-Capillary: 44 mg/dL — CL (ref 70–99)
Glucose-Capillary: 55 mg/dL — ABNORMAL LOW (ref 70–99)
Glucose-Capillary: 33 mg/dL — CL (ref 70–99)
Glucose-Capillary: 96 mg/dL (ref 70–99)

## 2018-12-19 LAB — CBC WITH DIFFERENTIAL/PLATELET
Abs Immature Granulocytes: 0.04 10*3/uL (ref 0.00–0.07)
Basophils Absolute: 0.1 10*3/uL (ref 0.0–0.1)
Basophils Relative: 0 %
Eosinophils Absolute: 0 10*3/uL (ref 0.0–0.5)
Eosinophils Relative: 0 %
HCT: 34.1 % — ABNORMAL LOW (ref 39.0–52.0)
Hemoglobin: 10.9 g/dL — ABNORMAL LOW (ref 13.0–17.0)
Immature Granulocytes: 0 %
Lymphocytes Relative: 13 %
Lymphs Abs: 1.5 10*3/uL (ref 0.7–4.0)
MCH: 29.2 pg (ref 26.0–34.0)
MCHC: 32 g/dL (ref 30.0–36.0)
MCV: 91.4 fL (ref 80.0–100.0)
Monocytes Absolute: 0.9 10*3/uL (ref 0.1–1.0)
Monocytes Relative: 7 %
Neutro Abs: 9.2 10*3/uL — ABNORMAL HIGH (ref 1.7–7.7)
Neutrophils Relative %: 80 %
Platelets: 417 10*3/uL — ABNORMAL HIGH (ref 150–400)
RBC: 3.73 MIL/uL — ABNORMAL LOW (ref 4.22–5.81)
RDW: 13.6 % (ref 11.5–15.5)
WBC: 11.6 10*3/uL — ABNORMAL HIGH (ref 4.0–10.5)
nRBC: 0 % (ref 0.0–0.2)

## 2018-12-19 LAB — MAGNESIUM: Magnesium: 2.1 mg/dL (ref 1.7–2.4)

## 2018-12-19 LAB — BASIC METABOLIC PANEL
Anion gap: >20 — ABNORMAL HIGH (ref 5–15)
BUN: 6 mg/dL (ref 6–20)
CO2: 17 mmol/L — ABNORMAL LOW (ref 22–32)
Calcium: 8.1 mg/dL — ABNORMAL LOW (ref 8.9–10.3)
Chloride: 100 mmol/L (ref 98–111)
Creatinine, Ser: 0.78 mg/dL (ref 0.61–1.24)
GFR calc Af Amer: >60 mL/min (ref >60)
GFR calc non Af Amer: >60 mL/min (ref >60)
Glucose, Bld: 112 mg/dL — ABNORMAL HIGH (ref 70–99)
Potassium: 3.3 mmol/L — ABNORMAL LOW (ref 3.5–5.1)
Sodium: 138 mmol/L (ref 135–145)

## 2018-12-19 LAB — CBC
HCT: 31.6 % — ABNORMAL LOW (ref 39.0–52.0)
Hemoglobin: 10.9 g/dL — ABNORMAL LOW (ref 13.0–17.0)
MCH: 29.8 pg (ref 26.0–34.0)
MCHC: 34.5 g/dL (ref 30.0–36.0)
MCV: 86.3 fL (ref 80.0–100.0)
Platelets: 380 10*3/uL (ref 150–400)
RBC: 3.66 MIL/uL — ABNORMAL LOW (ref 4.22–5.81)
RDW: 13.2 % (ref 11.5–15.5)
WBC: 10.2 10*3/uL (ref 4.0–10.5)
nRBC: 0 % (ref 0.0–0.2)

## 2018-12-19 LAB — MRSA PCR SCREENING: MRSA by PCR: NEGATIVE

## 2018-12-19 LAB — SARS CORONAVIRUS 2 BY RT PCR (HOSPITAL ORDER, PERFORMED IN ~~LOC~~ HOSPITAL LAB): SARS Coronavirus 2: NEGATIVE

## 2018-12-19 MED ORDER — DEXTROSE 50 % IV SOLN
1.0000 | Freq: Once | INTRAVENOUS | Status: AC
  Administered 2018-12-19: 50 mL via INTRAVENOUS

## 2018-12-19 MED ORDER — PROMETHAZINE HCL 25 MG PO TABS: 12.5000 mg | ORAL_TABLET | ORAL | Status: DC | PRN

## 2018-12-19 MED ORDER — ONDANSETRON HCL 4 MG/2ML IJ SOLN: 4.0000 mg | INTRAMUSCULAR | Status: DC | PRN

## 2018-12-19 MED ORDER — DEXTROSE 50 % IV SOLN
INTRAVENOUS | Status: AC
  Filled 2018-12-19: qty 50

## 2018-12-19 MED ORDER — SODIUM CHLORIDE 0.9 % IV SOLN
1.0000 g | Freq: Two times a day (BID) | INTRAVENOUS | Status: DC
  Administered 2018-12-19 – 2018-12-27 (×16): 1 g via INTRAVENOUS
  Filled 2018-12-19 (×8): qty 1
  Filled 2018-12-19: qty 10
  Filled 2018-12-19 (×5): qty 1
  Filled 2018-12-19: qty 10
  Filled 2018-12-19 (×2): qty 1

## 2018-12-19 MED ORDER — SPIRITUS FRUMENTI
1.0000 | Freq: Three times a day (TID) | ORAL | Status: DC
  Filled 2018-12-19 (×12): qty 1

## 2018-12-19 MED ORDER — LORAZEPAM 1 MG PO TABS
1.0000 mg | ORAL_TABLET | Freq: Three times a day (TID) | ORAL | Status: DC | PRN
  Administered 2018-12-20 – 2018-12-24 (×5): 1 mg via ORAL
  Filled 2018-12-19 (×5): qty 1

## 2018-12-19 MED ORDER — PROMETHAZINE HCL 25 MG/ML IJ SOLN
12.5000 mg | Freq: Four times a day (QID) | INTRAMUSCULAR | Status: DC | PRN
  Administered 2018-12-26: 14:00:00 12.5 mg via INTRAVENOUS
  Filled 2018-12-19: qty 1

## 2018-12-19 MED ORDER — LEVETIRACETAM 500 MG PO TABS: 500.0000 mg | ORAL_TABLET | Freq: Two times a day (BID) | ORAL | Status: DC

## 2018-12-19 MED ORDER — ONDANSETRON HCL 4 MG PO TABS: 4.0000 mg | ORAL_TABLET | ORAL | Status: DC | PRN

## 2018-12-19 MED ORDER — MORPHINE SULFATE (PF) 4 MG/ML IV SOLN
INTRAVENOUS | Status: AC
  Filled 2018-12-19: qty 1

## 2018-12-19 MED ORDER — INSULIN GLARGINE 100 UNIT/ML ~~LOC~~ SOLN
20.0000 [IU] | Freq: Every day | SUBCUTANEOUS | Status: DC
  Administered 2018-12-19 – 2018-12-21 (×2): 20 [IU] via SUBCUTANEOUS
  Filled 2018-12-19 (×4): qty 0.2

## 2018-12-19 MED ORDER — ESCITALOPRAM OXALATE 10 MG PO TABS
20.0000 mg | ORAL_TABLET | Freq: Every day | ORAL | Status: DC
  Administered 2018-12-20 – 2018-12-27 (×8): 20 mg via ORAL
  Filled 2018-12-19 (×8): qty 2

## 2018-12-19 MED ORDER — FREESTYLE LIBRE 14 DAY SENSOR MISC: 1.0000 | Status: DC

## 2018-12-19 MED ORDER — DEXTROSE-NACL 5-0.45 % IV SOLN
INTRAVENOUS | Status: DC
  Administered 2018-12-19: 16:00:00 via INTRAVENOUS

## 2018-12-19 MED ORDER — INSULIN GLARGINE 100 UNIT/ML SOLOSTAR PEN: 20.0000 [IU] | PEN_INJECTOR | Freq: Every day | SUBCUTANEOUS | Status: DC

## 2018-12-19 MED ORDER — LEVETIRACETAM IN NACL 500 MG/100ML IV SOLN
500.0000 mg | Freq: Two times a day (BID) | INTRAVENOUS | Status: DC
  Administered 2018-12-20 – 2018-12-27 (×15): 500 mg via INTRAVENOUS
  Filled 2018-12-19 (×16): qty 100

## 2018-12-19 MED ORDER — DEXTROSE 10 % IV SOLN: INTRAVENOUS | Status: DC

## 2018-12-19 MED ORDER — CEFAZOLIN SODIUM-DEXTROSE 2-4 GM/100ML-% IV SOLN
2.0000 g | Freq: Three times a day (TID) | INTRAVENOUS | Status: AC
  Administered 2018-12-19 – 2018-12-20 (×2): 2 g via INTRAVENOUS
  Filled 2018-12-19 (×3): qty 100

## 2018-12-19 MED ORDER — DEXTROSE 50 % IV SOLN: 25.0000 mL | Freq: Once | INTRAVENOUS | Status: DC

## 2018-12-19 MED ORDER — LEVETIRACETAM IN NACL 1000 MG/100ML IV SOLN
1000.0000 mg | Freq: Once | INTRAVENOUS | Status: AC
  Administered 2018-12-19: 1000 mg via INTRAVENOUS
  Filled 2018-12-19: qty 100

## 2018-12-19 MED ORDER — ONDANSETRON HCL 4 MG/2ML IJ SOLN
INTRAMUSCULAR | Status: AC
  Filled 2018-12-19: qty 2

## 2018-12-19 MED ORDER — LABETALOL HCL 5 MG/ML IV SOLN
10.0000 mg | INTRAVENOUS | Status: DC | PRN
  Filled 2018-12-19: qty 4

## 2018-12-19 MED ORDER — HYDROMORPHONE HCL 1 MG/ML IJ SOLN
0.5000 mg | INTRAMUSCULAR | Status: DC | PRN
  Administered 2018-12-19 – 2018-12-26 (×17): 1 mg via INTRAVENOUS
  Filled 2018-12-19 (×17): qty 1

## 2018-12-19 MED ORDER — PROMETHAZINE HCL 25 MG/ML IJ SOLN: 12.5000 mg | Freq: Four times a day (QID) | INTRAMUSCULAR | Status: DC | PRN

## 2018-12-19 MED ORDER — DEXTROSE 50 % IV SOLN
25.0000 mL | Freq: Once | INTRAVENOUS | Status: AC
  Administered 2018-12-19: 25 mL via INTRAVENOUS

## 2018-12-19 MED ORDER — PANTOPRAZOLE SODIUM 40 MG IV SOLR
40.0000 mg | Freq: Every day | INTRAVENOUS | Status: DC
  Administered 2018-12-19 – 2018-12-24 (×6): 40 mg via INTRAVENOUS
  Filled 2018-12-19 (×6): qty 40

## 2018-12-19 MED ORDER — POTASSIUM CHLORIDE IN NACL 20-0.9 MEQ/L-% IV SOLN
INTRAVENOUS | Status: DC
  Administered 2018-12-19 – 2018-12-27 (×11): via INTRAVENOUS
  Filled 2018-12-19 (×12): qty 1000

## 2018-12-19 MED ORDER — ONDANSETRON HCL 4 MG/2ML IJ SOLN
4.0000 mg | Freq: Once | INTRAMUSCULAR | Status: AC
  Administered 2018-12-24: 4 mg via INTRAVENOUS
  Filled 2018-12-19 (×2): qty 2

## 2018-12-19 MED ORDER — HYDROCODONE-ACETAMINOPHEN 5-325 MG PO TABS
1.0000 | ORAL_TABLET | ORAL | Status: DC | PRN
  Administered 2018-12-21 – 2018-12-27 (×12): 1 via ORAL
  Filled 2018-12-19 (×12): qty 1

## 2018-12-19 MED ORDER — MORPHINE SULFATE (PF) 2 MG/ML IV SOLN
2.0000 mg | Freq: Once | INTRAVENOUS | Status: DC
  Filled 2018-12-19: qty 1

## 2018-12-19 MED ORDER — CHLORHEXIDINE GLUCONATE CLOTH 2 % EX PADS
6.0000 | MEDICATED_PAD | Freq: Every day | CUTANEOUS | Status: DC
  Administered 2018-12-19 – 2018-12-27 (×9): 6 via TOPICAL

## 2018-12-19 MED ORDER — DOCUSATE SODIUM 100 MG PO CAPS
100.0000 mg | ORAL_CAPSULE | Freq: Two times a day (BID) | ORAL | Status: DC
  Administered 2018-12-21 – 2018-12-24 (×3): 100 mg via ORAL
  Filled 2018-12-19 (×9): qty 1

## 2018-12-19 MED ORDER — QUETIAPINE FUMARATE 100 MG PO TABS
300.0000 mg | ORAL_TABLET | Freq: Every day | ORAL | Status: DC
  Administered 2018-12-19 – 2018-12-26 (×8): 300 mg via ORAL
  Filled 2018-12-19 (×2): qty 1
  Filled 2018-12-19 (×3): qty 3
  Filled 2018-12-19 (×3): qty 1

## 2018-12-19 MED ORDER — OXYCODONE HCL 5 MG PO TABS
5.0000 mg | ORAL_TABLET | ORAL | Status: DC | PRN
  Administered 2018-12-20 – 2018-12-27 (×18): 5 mg via ORAL
  Filled 2018-12-19 (×18): qty 1

## 2018-12-19 MED ORDER — AMLODIPINE BESYLATE 10 MG PO TABS
10.0000 mg | ORAL_TABLET | Freq: Every day | ORAL | Status: DC
  Administered 2018-12-20 – 2018-12-27 (×8): 10 mg via ORAL
  Filled 2018-12-19 (×8): qty 1

## 2018-12-19 NOTE — ED Provider Notes (Signed)
43 yo M with a chief complaints of altered mental status.  Found to be confused today and brought to the ED.  Found to have blood sugar in the 30s.  Has a history of alcoholism.  CT of the head is concerning for reaccumulated subdural.  Just had that evacuated about a month ago.  He has some mild right upper extremity weakness that has not been documented previously.  Neurosurgery was consulted and would like the patient directed to the ICU at [facility].  Due to his hypoglycemia he was started on a D10 drip.  Given Keppra for his initial seizure in the waiting room.  He has a significant metabolic acidosis that likely is from seizure activity. Neurosurgery admit.    Deno Etienne, DO 12/19/18 [phone]

## 2018-12-19 NOTE — ED Notes (Signed)
RT assessed patient upon arrival. Snoring respirations, but SAT 100%. Suctioned mouth and placed on NRB just in case. Jaw thrust until he started protecting his airway. Seems to be doing maintaining airway

## 2018-12-19 NOTE — Procedures (Signed)
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  Patient was transferred to [facility] I recommended redo bur hole craniectomy for placement of subdural drain  Procedure: Patient's left side of his head was prepped and draped in routine sterile fashion the old part of his incision the back one third of it was opened up I palpated the bur hole we pierced the dura and scar with a spinal needle and placed a ventricular catheter through the old bur hole into the subdural space immediately got old [employer] well appearing blood and CSF mixture.  This was then tunneled posteriorly sewed in place and incision was resutured.  This was then hooked up to the collecting system for drainage.  Patient tolerated the procedure well.

## 2018-12-19 NOTE — ED Notes (Signed)
Willie [name] (girlfriend) - [phone] Willie [name] (mother) - [phone]

## 2018-12-19 NOTE — ED Notes (Signed)
Pt switched from nonrebreather to nasal cannula at 2L.

## 2018-12-19 NOTE — ED Notes (Signed)
ED Provider at bedside. 

## 2018-12-19 NOTE — ED Notes (Signed)
EMS arrived to transport pt to [facility]- report given at bedside. Pt a/o.

## 2018-12-19 NOTE — H&P (Signed)
Willie [name] is an 43 y.o. male.   Chief Complaint: Subdural hematoma chronic recurrent HPI: 43 year old gentleman has had some altered mental status last couple days came to the ER today had a seizure in the ER work-up revealed a large chronic subdural hematoma and patient was transferred to [facility] for evaluation by neurosurgery.  Past Medical History:  Diagnosis Date  . Alcohol abuse   . Alcoholic pancreatitis    recurrent  . Depression   . Diabetes mellitus, type II ([place])    New Onset 03/2010  . GERD (gastroesophageal reflux disease)   . High cholesterol   . 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 Hypertension   . Sleep apnea    does not wear a CPAP    Past Surgical History:  Procedure Laterality Date  . COLONOSCOPY W/ BIOPSIES  09/2010   Dr. Ardis Hughs.  for intermittent rectal bleeding.  Mild sigmoid to descending diverticulosis.  Mild left colon erythema, benign biopsy, probably "prep effect"  . COLONOSCOPY WITH PROPOFOL N/A 07/06/2017   Procedure: COLONOSCOPY WITH PROPOFOL;  Surgeon: Jerene Bears, MD;  Location: [facility];  Service: Gastroenterology;  Laterality: N/A;  . CRANIOTOMY Bilateral 11/24/2018   Procedure: Bilateral craniotomy for evacuation of subdural hematoma;  Surgeon: Erline Levine, MD;  Location: [facility];  Service: Neurosurgery;  Laterality: Bilateral;  Bilateral craniotomy for evacuation of subdural hematoma  . LUMBAR MICRODISCECTOMY  ~ 2004   Dr Ellene Route    Family History  Problem Relation Age of Onset  . Diabetes Mother   . Lung cancer Brother        twin brother  . Pancreatic cancer Paternal Aunt   . Colon cancer Neg Hx   . Stomach cancer Neg Hx    Social History:  reports that he has quit smoking. His smoking use included cigarettes. He started smoking about 2 years ago. He has a 0.25 pack-year smoking history. He has never used smokeless tobacco. He reports current alcohol use. He reports previous drug  use.  Allergies:  Allergies  Allergen Reactions  . Invokamet [Canagliflozin-Metformin Hcl] Other (See Comments)    Lactic acidosis  . Metformin And Related Other (See Comments)    DRASTIC drop in blood sugar    Medications Prior to Admission  Medication Sig Dispense Refill  . amLODipine (NORVASC) 10 MG tablet Take 1 tablet (10 mg total) by mouth daily. 30 tablet 0  . blood glucose meter kit and supplies KIT Dispense based on patient and insurance preference. Use up to four times daily as directed. (FOR ICD-9 250.00, 250.01). 1 each 0  . CIALIS 10 MG tablet TAKE 1 TABLET BY MOUTH DAILY AS NEEDED FOR ERECTILE DYSFUNCTION (Patient taking differently: Take 10 mg by mouth daily as needed for erectile dysfunction. ) 10 tablet 5  . Continuous Blood Gluc Sensor (FREESTYLE LIBRE 14 DAY SENSOR) MISC Inject 1 each into the skin every 14 (fourteen) days. 6 each 3  . escitalopram (LEXAPRO) 20 MG tablet Take 1 tablet (20 mg total) by mouth daily. 90 tablet 0  . glucose blood (FREESTYLE LITE) test strip Use as instructed 100 each 12  . ibuprofen (ADVIL) 400 MG tablet Take 1 tablet (400 mg total) by mouth every 6 (six) hours as needed for moderate pain. 30 tablet 0  . Insulin Glargine (LANTUS SOLOSTAR) 100 UNIT/ML Solostar Pen Inject 20-25 Units into the skin at bedtime. 6 mL 2  . insulin  lispro (HUMALOG KWIKPEN) 100 UNIT/ML KwikPen Inject subcutaneously 3 times a day (just before each meal) 09-10-08 units Humalog 5 units with small meal, 10 units with medium sized meal, and 15 units with large meal. 10 mL 2  . Insulin Pen Needle (ULTICARE SHORT PEN NEEDLES) 31G X 8 MM MISC Use once daily to inject insulin. 100 each 1  . Lancets (FREESTYLE) lancets 2 (two) times daily 200 each 5  . levETIRAcetam (KEPPRA) 500 MG tablet Take 1 tablet (500 mg total) by mouth 2 (two) times daily for 14 days. 28 tablet 0  . LORazepam (ATIVAN) 1 MG tablet Take 1 tablet (1 mg total) by mouth every 8 (eight) hours as needed for  anxiety. 30 tablet 0  . Multiple Vitamins-Minerals (MULTIVITAMIN) tablet Take 1 tablet by mouth daily. 30 tablet 0  . ondansetron (ZOFRAN ODT) 4 MG disintegrating tablet 42m ODT q4 hours prn nausea/vomit (Patient taking differently: Take 4 mg by mouth every 4 (four) hours as needed for nausea or vomiting. ) 4 tablet 0  . oxyCODONE (ROXICODONE) 5 MG immediate release tablet Take 1 tablet (5 mg total) by mouth every 4 (four) hours as needed (pain). 30 tablet 0  . pantoprazole (PROTONIX) 40 MG tablet Take 1 tablet (40 mg total) by mouth 2 (two) times daily. 60 tablet 5  . QUEtiapine (SEROQUEL) 300 MG tablet Take 1 tablet (300 mg total) by mouth at bedtime. 90 tablet 0  . sildenafil (REVATIO) 20 MG tablet Take 1-2 tabs by mouth once daily as needed 30-60 minutes prior to sexual activity. 50 tablet 0    Results for orders placed or performed during the hospital encounter of 12/19/18 (from the past 48 hour(s))  CBG monitoring, ED     Status: Abnormal   Collection Time: 12/19/18  1:48 PM  Result Value Ref Range   Glucose-Capillary 44 (LL) 70 - 99 mg/dL   Comment 1 Call MD NNP PA CNM   Basic metabolic panel     Status: Abnormal   Collection Time: 12/19/18  1:55 PM  Result Value Ref Range   Sodium 138 135 - 145 mmol/L   Potassium 3.3 (L) 3.5 - 5.1 mmol/L   Chloride 100 98 - 111 mmol/L   CO2 17 (L) 22 - 32 mmol/L   Glucose, Bld 112 (H) 70 - 99 mg/dL   BUN 6 6 - 20 mg/dL   Creatinine, Ser 0.78 0.61 - 1.24 mg/dL   Calcium 8.1 (L) 8.9 - 10.3 mg/dL   GFR calc non Af Amer >60 >60 mL/min   GFR calc Af Amer >60 >60 mL/min   Anion gap >20 (H) 5 - 15    Comment: Performed at [facility] [address] [id number] CBC WITH DIFFERENTIAL     Status: Abnormal   Collection Time: 12/19/18  1:55 PM  Result Value Ref Range   WBC 11.6 (H) 4.0 - 10.5 K/uL   RBC 3.73 (L) 4.22 - 5.81 MIL/uL   Hemoglobin 10.9 (L) 13.0 - 17.0 g/dL   HCT 34.1 (L) 39.0 - 52.0 %   MCV 91.4 80.0 - 100.0  fL   MCH 29.2 26.0 - 34.0 pg   MCHC 32.0 30.0 - 36.0 g/dL   RDW 13.6 11.5 - 15.5 %   Platelets 417 (H) 150 - 400 K/uL   nRBC 0.0 0.0 - 0.2 %   Neutrophils Relative % 80 %   Neutro Abs 9.2 (H) 1.7 - 7.7 K/uL  Lymphocytes Relative 13 %   Lymphs Abs 1.5 0.7 - 4.0 K/uL   Monocytes Relative 7 %   Monocytes Absolute 0.9 0.1 - 1.0 K/uL   Eosinophils Relative 0 %   Eosinophils Absolute 0.0 0.0 - 0.5 K/uL   Basophils Relative 0 %   Basophils Absolute 0.1 0.0 - 0.1 K/uL   Immature Granulocytes 0 %   Abs Immature Granulocytes 0.04 0.00 - 0.07 K/uL    Comment: Performed at [facility], [address] [place], [place] [address]  Magnesium     Status: None   Collection Time: 12/19/18  1:55 PM  Result Value Ref Range   Magnesium 2.1 1.7 - 2.4 mg/dL    Comment: Performed at [facility], [address] [place], [place] [address]  CBG monitoring, ED     Status: Abnormal   Collection Time: 12/19/18  2:41 PM  Result Value Ref Range   Glucose-Capillary 55 (L) 70 - 99 mg/dL  SARS Coronavirus 2 [facility] order, Performed in [facility] hospital lab) Nasopharyngeal Nasopharyngeal Swab     Status: None   Collection Time: 12/19/18  3:28 PM   Specimen: Nasopharyngeal Swab  Result Value Ref Range   SARS Coronavirus 2 NEGATIVE NEGATIVE    Comment: (NOTE) If result is NEGATIVE SARS-CoV-2 target nucleic acids are NOT DETECTED. The SARS-CoV-2 RNA is generally detectable in upper and lower  respiratory specimens during the acute phase of infection. The lowest  concentration of SARS-CoV-2 viral copies this assay can detect is 250  copies / mL. A negative result does not preclude SARS-CoV-2 infection  and should not be used as the sole basis for treatment or other  patient management decisions.  A negative result may occur with  improper specimen collection / handling, submission of specimen other  than nasopharyngeal swab, presence of viral mutation(s) within the  areas targeted  by this assay, and inadequate number of viral copies  (<250 copies / mL). A negative result must be combined with clinical  observations, patient history, and epidemiological information. If result is POSITIVE SARS-CoV-2 target nucleic acids are DETECTED. The SARS-CoV-2 RNA is generally detectable in upper and lower  respiratory specimens dur ing the acute phase of infection.  Positive  results are indicative of active infection with SARS-CoV-2.  Clinical  correlation with patient history and other diagnostic information is  necessary to determine patient infection status.  Positive results do  not rule out bacterial infection or co-infection with other viruses. If result is PRESUMPTIVE POSTIVE SARS-CoV-2 nucleic acids MAY BE PRESENT.   A presumptive positive result was obtained on the submitted specimen  and confirmed on repeat testing.  While 2019 novel coronavirus  (SARS-CoV-2) nucleic acids may be present in the submitted sample  additional confirmatory testing may be necessary for epidemiological  and / or clinical management purposes  to differentiate between  SARS-CoV-2 and other Sarbecovirus currently known to infect humans.  If clinically indicated additional testing with an alternate test  methodology [phone]) is advised. The SARS-CoV-2 RNA is generally  detectable in upper and lower respiratory sp ecimens during the acute  phase of infection. The expected result is Negative. Fact Sheet for Patients:  StrictlyIdeas.no Fact Sheet for Healthcare Providers: BankingDealers.co.za This test is not yet approved or cleared by the [place] FDA and has been authorized for detection and/or diagnosis of SARS-CoV-2 by FDA under an Emergency Use Authorization (EUA).  This EUA will remain in effect (meaning this test  can be used) for the duration of the COVID-19 declaration under Section 564(b)(1) of the Act, 21 U.S.C. section  360bbb-3(b)(1), unless the authorization is terminated or revoked sooner. Performed at [facility], [address] [place], [place] [address]   CBG monitoring, ED     Status: Abnormal   Collection Time: 12/19/18  3:34 PM  Result Value Ref Range   Glucose-Capillary 35 (LL) 70 - 99 mg/dL   Comment 1 Repeat Test   CBG monitoring, ED     Status: Abnormal   Collection Time: 12/19/18  3:37 PM  Result Value Ref Range   Glucose-Capillary 33 (LL) 70 - 99 mg/dL   Comment 1 Call MD NNP PA CNM   CBG monitoring, ED     Status: None   Collection Time: 12/19/18  4:30 PM  Result Value Ref Range   Glucose-Capillary 96 70 - 99 mg/dL   Ct Head Wo Contrast  Result Date: 12/19/2018 CLINICAL DATA:  New seizure, status post craniotomy for subdural hematoma EXAM: CT HEAD WITHOUT CONTRAST TECHNIQUE: Contiguous axial images were obtained from the base of the skull through the vertex without intravenous contrast. COMPARISON:  11/26/2018 FINDINGS: Brain: There is a mostly low-attenuation subdural collection about the left frontal lobe with minimal, layering high attenuation internally and containing tiny locules of internal air (series 2, image 23). Maximum coronal thickness is 2.4 cm and there is effacement of the left-sided sulci and 9 mm left right midline shift. There is no significant right-sided subdural collection. Vascular: No hyperdense vessel or unexpected calcification. Skull: Redemonstrated biparietal craniotomies. Negative for fracture or focal lesion. Sinuses/Orbits: No acute finding. Other: None. IMPRESSION: 1. There is a mostly low-attenuation subdural collection about the left frontal lobe with minimal, layering high attenuation internally and containing tiny locules of internal air (series 2, image 23). Maximum coronal thickness is 2.4 cm and there is effacement of the left-sided sulci and 9 mm left right midline shift. Findings are consistent with reaccumulation or recurrence of a  previously evacuation subdural hematoma. 2. No significant persistent or recurrent right-sided subdural collection. 3. Redemonstrated biparietal craniotomies. Electronically Signed   By: Eddie Candle M.D.   On: 12/19/2018 14:49    Review of Systems  Unable to perform ROS: Critical illness    Blood pressure (!) 146/95, pulse (!) 117, temperature 99.9 F (37.7 C), temperature source Axillary, resp. rate (!) 22, height 6' (1.829 m), weight 79.6 kg, SpO2 99 %. Physical Exam  Constitutional: He is oriented to person, place, and time. He appears well-developed.  HENT:  Head: Normocephalic.  Eyes: Pupils are equal, round, and reactive to light.  Neck: Normal range of motion.  Cardiovascular: Normal rate.  Respiratory: Effort normal.  GI: Soft.  Neurological: He is alert and oriented to person, place, and time. He has normal strength. GCS eye subscore is 4. GCS verbal subscore is 5. GCS motor subscore is 6.  Confused but awake follows commands he was all extremities well with no pronator drift pupils are equal  Skin: Skin is warm and dry.     Assessment/Plan 43 year old gentleman 1 month out from bilateral craniotomies for subdural with a recurrent large chronic on the left.  Spoke to the patient talk to his mother as well as his fiance and recommended bedside twist drill placement of subdural drain.  We performed this procedure patient tolerated well will observe will get a CT scan in the morning.  Caspian Deleonardis P, MD 12/19/2018, 6:59 PM

## 2018-12-19 NOTE — ED Provider Notes (Signed)
[facility] EMERGENCY DEPARTMENT Provider Note   CSN: [id number] Arrival date & time: 12/19/18  1324     History   Chief Complaint Chief Complaint  Patient presents with  . Seizures    HPI Willie [name] is a 43 y.o. male.  Level 5 caveat secondary to altered mental status.  Patient presented to the emergency department for evaluation of something related to his recent subdural.  While he was waiting he was seizing while sitting in the wheelchair.  He was moved emergently into the acute care bed where he was found to have a blood sugar in the 40s.  He was given an amp of D50.  His girlfriend who brought him had left the department and she was called and asked to come back so she can provide some history.     The history is provided by a friend.  Seizures Seizure activity on arrival: no   Seizure type:  Grand mal Initial focality:  Unable to specify Episode characteristics: abnormal movements, eye deviation and generalized shaking   Postictal symptoms: confusion and somnolence   Return to baseline: no   Severity:  Unable to specify Timing:  Once Progression:  Improving Context: alcohol withdrawal (?) and previous head injury   Recent head injury:  Over 24 hours ago PTA treatment:  None History of seizures: no     Past Medical History:  Diagnosis Date  . Alcohol abuse   . Alcoholic pancreatitis    recurrent  . Depression   . Diabetes mellitus, type II ([place])    New Onset 03/2010  . GERD (gastroesophageal reflux disease)   . High cholesterol   . 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 Hypertension   . Sleep apnea    does not wear a CPAP    Patient Active Problem List   Diagnosis Date Noted  . Subdural bleeding ([employer]) 11/24/2018  . Abdominal pain 11/22/2018  . SDH (subdural hematoma) ([place]) 11/22/2018  . Abnormal LFTs 11/22/2018  . IDDM (insulin dependent diabetes mellitus) ([place]) 09/12/2018  . Acute  hepatitis 09/11/2018  . Alcohol-induced mood disorder ([place])   . DKA (diabetic ketoacidoses) ([place]) 07/28/2017  . Alcohol withdrawal ([place]) 07/28/2017  . Abnormal CT scan, colon   . Benign neoplasm of descending colon   . Lower GI bleed 07/04/2017  . Transaminitis 07/04/2017  . Pain   . Bleeding internal hemorrhoids   . Anemia of chronic disease   . Left arm weakness   . Anemia, blood loss   . Vomiting alone 03/27/2017  . Chest pain 03/27/2017  . Nausea and vomiting   . Lactic acidosis 03/26/2017  . History of alcohol abuse 06/25/2016  . Tobacco abuse 06/25/2016  . Anxiety state 11/20/2015  . Dental infection 07/10/2015  . Mood disorder ([place]) 04/18/2015  . Eczema 06/27/2014  . GERD (gastroesophageal reflux disease) 05/24/2014  . Snoring 04/07/2013  . Suicide attempt by substance overdose ([place]) 02/10/2013  . Alcohol abuse 12/31/2012  . Erectile dysfunction 12/09/2012  . Hematuria 06/11/2012  . Vision problem 02/02/2012  . Insomnia 12/20/2011  . Chronic pancreatitis ([place]) 02/05/2011  . Depression with anxiety 11/13/2010  . Hyperlipemia 06/27/2010  . Diabetes type 2, uncontrolled ([place]) 05/22/2010  . Essential hypertension 05/22/2010    Past Surgical History:  Procedure Laterality Date  . COLONOSCOPY W/ BIOPSIES  09/2010   Dr. Ardis Hughs.  for intermittent rectal bleeding.  Mild sigmoid to descending  diverticulosis.  Mild left colon erythema, benign biopsy, probably "prep effect"  . COLONOSCOPY WITH PROPOFOL N/A 07/06/2017   Procedure: COLONOSCOPY WITH PROPOFOL;  Surgeon: Jerene Bears, MD;  Location: [facility];  Service: Gastroenterology;  Laterality: N/A;  . CRANIOTOMY Bilateral 11/24/2018   Procedure: Bilateral craniotomy for evacuation of subdural hematoma;  Surgeon: Erline Levine, MD;  Location: [facility];  Service: Neurosurgery;  Laterality: Bilateral;  Bilateral craniotomy for evacuation of subdural hematoma  . LUMBAR MICRODISCECTOMY  ~ 2004   Dr Ellene Route        Home  Medications    Prior to Admission medications   Medication Sig Start Date End Date Taking? Authorizing Provider  amLODipine (NORVASC) 10 MG tablet Take 1 tablet (10 mg total) by mouth daily. 11/28/18 12/28/18  Arrien, Jimmy Picket, MD  blood glucose meter kit and supplies KIT Dispense based on patient and insurance preference. Use up to four times daily as directed. (FOR ICD-9 250.00, 250.01). 07/24/18   Debbrah Alar, NP  CIALIS 10 MG tablet TAKE 1 TABLET BY MOUTH DAILY AS NEEDED FOR ERECTILE DYSFUNCTION Patient taking differently: Take 10 mg by mouth daily as needed for erectile dysfunction.  07/09/18   Debbrah Alar, NP  Continuous Blood Gluc Sensor (FREESTYLE LIBRE 14 DAY SENSOR) MISC Inject 1 each into the skin every 14 (fourteen) days. 04/27/18   Debbrah Alar, NP  escitalopram (LEXAPRO) 20 MG tablet Take 1 tablet (20 mg total) by mouth daily. 08/17/18   Debbrah Alar, NP  glucose blood (FREESTYLE LITE) test strip Use as instructed 07/24/18   Debbrah Alar, NP  ibuprofen (ADVIL) 400 MG tablet Take 1 tablet (400 mg total) by mouth every 6 (six) hours as needed for moderate pain. 11/28/18   Arrien, Jimmy Picket, MD  Insulin Glargine (LANTUS SOLOSTAR) 100 UNIT/ML Solostar Pen Inject 20-25 Units into the skin at bedtime. 12/04/18   Debbrah Alar, NP  insulin lispro (HUMALOG KWIKPEN) 100 UNIT/ML KwikPen Inject subcutaneously 3 times a day (just before each meal) 09-10-08 units Humalog 5 units with small meal, 10 units with medium sized meal, and 15 units with large meal. 12/04/18   Debbrah Alar, NP  Insulin Pen Needle (ULTICARE SHORT PEN NEEDLES) 31G X 8 MM MISC Use once daily to inject insulin. 04/27/18   Debbrah Alar, NP  Lancets (FREESTYLE) lancets 2 (two) times daily 02/27/17   Debbrah Alar, NP  levETIRAcetam (KEPPRA) 500 MG tablet Take 1 tablet (500 mg total) by mouth 2 (two) times daily for 14 days. 11/28/18 12/12/18  Arrien, Jimmy Picket, MD   LORazepam (ATIVAN) 1 MG tablet Take 1 tablet (1 mg total) by mouth every 8 (eight) hours as needed for anxiety. 12/04/18   Debbrah Alar, NP  Multiple Vitamins-Minerals (MULTIVITAMIN) tablet Take 1 tablet by mouth daily. 02/12/16   Palumbo, April, MD  ondansetron (ZOFRAN ODT) 4 MG disintegrating tablet 88m ODT q4 hours prn nausea/vomit Patient taking differently: Take 4 mg by mouth every 4 (four) hours as needed for nausea or vomiting.  10/13/17   BMalvin Johns MD  oxyCODONE (ROXICODONE) 5 MG immediate release tablet Take 1 tablet (5 mg total) by mouth every 4 (four) hours as needed (pain). 11/28/18 11/28/19  OJudith Part MD  pantoprazole (PROTONIX) 40 MG tablet Take 1 tablet (40 mg total) by mouth 2 (two) times daily. 12/04/18   ODebbrah Alar NP  QUEtiapine (SEROQUEL) 300 MG tablet Take 1 tablet (300 mg total) by mouth at bedtime. 12/04/18   ODebbrah Alar NP  sildenafil (  REVATIO) 20 MG tablet Take 1-2 tabs by mouth once daily as needed 30-60 minutes prior to sexual activity. 07/24/18   Debbrah Alar, NP    Family History Family History  Problem Relation Age of Onset  . Diabetes Mother   . Lung cancer Brother        twin brother  . Pancreatic cancer Paternal Aunt   . Colon cancer Neg Hx   . Stomach cancer Neg Hx     Social History Social History   Tobacco Use  . Smoking status: Former Smoker    Packs/day: 0.25    Years: 1.00    Pack years: 0.25    Types: Cigarettes    Start date: 04/29/2016  . Smokeless tobacco: Never Used  . Tobacco comment: 4 Cigarettes a day   Substance Use Topics  . Alcohol use: Yes    Comment: 12 pack beer a day  . Drug use: Not Currently    Comment: none in years     Allergies   Invokamet [canagliflozin-metformin hcl] and Metformin and related   Review of Systems Review of Systems  Unable to perform ROS: Mental status change  Neurological: Positive for seizures.     Physical Exam Updated Vital Signs BP (!) 163/119 (BP  Location: Right Arm)   Pulse (!) 175   Temp 98.4 F (36.9 C) (Oral)   Resp 20   SpO2 99%   Physical Exam Vitals signs and nursing note reviewed.  Constitutional:      General: He is not in acute distress. HENT:     Head: Normocephalic and atraumatic.     Comments: Healing surgical scars biparietal    Nose: Nose normal.     Mouth/Throat:     Mouth: Mucous membranes are moist.  Eyes:     Conjunctiva/sclera: Conjunctivae normal.  Cardiovascular:     Rate and Rhythm: Regular rhythm. Tachycardia present.     Pulses: Normal pulses.  Pulmonary:     Breath sounds: No stridor. No wheezing.  Abdominal:     General: Abdomen is flat.     Palpations: There is no mass.     Tenderness: There is no abdominal tenderness.  Musculoskeletal: Normal range of motion.  Skin:    General: Skin is warm and dry.     Capillary Refill: Capillary refill takes less than 2 seconds.  Neurological:     Comments: Initially patient with sonorus breathing, no response to pain. Later was awake and more conversant, still confused about events, moving all extremities, but not really giving full effort on motor exam,      ED Treatments / Results  Labs (all labs ordered are listed, but only abnormal results are displayed) Labs Reviewed  BASIC METABOLIC PANEL - Abnormal; Notable for the following components:      Result Value   Potassium 3.3 (*)    CO2 17 (*)    Glucose, Bld 112 (*)    Calcium 8.1 (*)    Anion gap >20 (*)    All other components within normal limits  CBC WITH DIFFERENTIAL/PLATELET - Abnormal; Notable for the following components:   WBC 11.6 (*)    RBC 3.73 (*)    Hemoglobin 10.9 (*)    HCT 34.1 (*)    Platelets 417 (*)    Neutro Abs 9.2 (*)    All other components within normal limits  CBG MONITORING, ED - Abnormal; Notable for the following components:   Glucose-Capillary 44 (*)    All other  components within normal limits  CBG MONITORING, ED - Abnormal; Notable for the following  components:   Glucose-Capillary 55 (*)    All other components within normal limits  CBG MONITORING, ED - Abnormal; Notable for the following components:   Glucose-Capillary 35 (*)    All other components within normal limits  CBG MONITORING, ED - Abnormal; Notable for the following components:   Glucose-Capillary 33 (*)    All other components within normal limits  SARS CORONAVIRUS 2 (HOSPITAL ORDER, [facility])  MAGNESIUM  CBG MONITORING, ED    EKG EKG Interpretation  Date/Time:  Saturday December 19 2018 13:50:45 EDT Ventricular Rate:  141 PR Interval:    QRS Duration: 88 QT Interval:  352 QTC Calculation: 540 R Axis:   46 Text Interpretation:  Sinus tachycardia Prolonged QT interval increased QTc compared with prior 8/20 Confirmed by Aletta Edouard [phone]) on 12/19/2018 2:29:01 PM   Radiology Ct Head Wo Contrast  Result Date: 12/19/2018 CLINICAL DATA:  New seizure, status post craniotomy for subdural hematoma EXAM: CT HEAD WITHOUT CONTRAST TECHNIQUE: Contiguous axial images were obtained from the base of the skull through the vertex without intravenous contrast. COMPARISON:  11/26/2018 FINDINGS: Brain: There is a mostly low-attenuation subdural collection about the left frontal lobe with minimal, layering high attenuation internally and containing tiny locules of internal air (series 2, image 23). Maximum coronal thickness is 2.4 cm and there is effacement of the left-sided sulci and 9 mm left right midline shift. There is no significant right-sided subdural collection. Vascular: No hyperdense vessel or unexpected calcification. Skull: Redemonstrated biparietal craniotomies. Negative for fracture or focal lesion. Sinuses/Orbits: No acute finding. Other: None. IMPRESSION: 1. There is a mostly low-attenuation subdural collection about the left frontal lobe with minimal, layering high attenuation internally and containing tiny locules of internal air (series 2,  image 23). Maximum coronal thickness is 2.4 cm and there is effacement of the left-sided sulci and 9 mm left right midline shift. Findings are consistent with reaccumulation or recurrence of a previously evacuation subdural hematoma. 2. No significant persistent or recurrent right-sided subdural collection. 3. Redemonstrated biparietal craniotomies. Electronically Signed   By: Eddie Candle M.D.   On: 12/19/2018 14:49    Procedures .Critical Care Performed by: Hayden Rasmussen, MD Authorized by: Hayden Rasmussen, MD   Critical care provider statement:    Critical care time (minutes):  45   Critical care time was exclusive of:  Separately billable procedures and treating other patients   Critical care was necessary to treat or prevent imminent or life-threatening deterioration of the following conditions:  CNS failure or compromise and metabolic crisis   Critical care was time spent personally by me on the following activities:  Discussions with consultants, evaluation of patient's response to treatment, examination of patient, ordering and performing treatments and interventions, ordering and review of laboratory studies, ordering and review of radiographic studies, pulse oximetry, re-evaluation of patient's condition, obtaining history from patient or surrogate, review of old charts and development of treatment plan with patient or surrogate   I assumed direction of critical care for this patient from another provider in my specialty: no     (including critical care time)  Medications Ordered in ED Medications  dextrose 50 % solution 50 mL (has no administration in time range)  levETIRAcetam (KEPPRA) IVPB 1000 mg/100 mL premix (has no administration in time range)  dextrose 50 % solution 50 mL (50 mLs Intravenous Given 12/19/18 1350)  Initial Impression / Assessment and Plan / ED Course  I have reviewed the triage vital signs and the nursing notes.  Pertinent labs & imaging results  that were available during my care of the patient were reviewed by me and considered in my medical decision making (see chart for details).  Clinical Course as of Dec 19 1719  Sat Dec 19, 1182  [id number] 43 year old male with history of alcohol abuse diabetes and recent fall with subdurals bilaterally status post surgical evacuation here for evaluation of his wounds looking more red on the left side and being a little more altered today.  His girlfriend is providing history as the patient was unresponsive from a seizure in the waiting room.  His blood sugar was low at 44.  He was given an amp of D50 with some improvement in his mental status although he is still somewhat agitated and combative.  I think the cause of his seizure is probably multifactorial and have ordered him a head CT.  Differential includes hypoglycemia, seizure, reaccumulation subdural, alcohol withdrawal   [MB]  1506 Patient is now more awake and verbal moving all extremities non-focally.  He still little fuzzy on details and slow to respond at times.  Is unclear whether this is the low blood sugar the postictal phase of his seizure or related to the new blood accumulation on his CT.  I think ultimately this will need to be run by neurosurgery although would like to get a better sense of what his mental status is when he recovers a little more so we can give a more clear picture to the neurosurgeon.  I will review this with my partner Dr. Tyrone Nine who will be taking over for him.   [MB]  7017 Patient's repeat blood sugar again is in the 30s.  We are giving him more D50 and starting an infusion of D10.  Neurosurgery has been consulted regarding the reaccumulation on the left side.   [MB]  1548 Discussed with Neurosurg PA for Dr Saintclair Halsted who will talk with him and call back with recommendations.    [MB]    Clinical Course User Index [MB] Hayden Rasmussen, MD        Final Clinical Impressions(s) / ED Diagnoses   Final diagnoses:  SDH  (subdural hematoma) ([place])  Seizure ([place])  Hypoglycemia    ED Discharge Orders    None       Hayden Rasmussen, MD 12/19/18 1727

## 2018-12-19 NOTE — ED Triage Notes (Signed)
Pt was in the lobby in a wheelchair waiting to be triaged when he began seizing. Pt did not fall from the chair. Pt moved to stretcher and taken to a room. EDP to bedside.

## 2018-12-20 ENCOUNTER — Inpatient Hospital Stay (HOSPITAL_COMMUNITY): Payer: Self-pay

## 2018-12-20 LAB — GLUCOSE, CAPILLARY
Glucose-Capillary: 108 mg/dL — ABNORMAL HIGH (ref 70–99)
Glucose-Capillary: 230 mg/dL — ABNORMAL HIGH (ref 70–99)
Glucose-Capillary: 77 mg/dL (ref 70–99)

## 2018-12-20 MED ORDER — VITAMIN B-1 100 MG PO TABS
100.0000 mg | ORAL_TABLET | Freq: Every day | ORAL | Status: DC
  Administered 2018-12-21 – 2018-12-27 (×7): 100 mg via ORAL
  Filled 2018-12-20 (×7): qty 1

## 2018-12-20 MED ORDER — LORAZEPAM 2 MG/ML IJ SOLN
2.0000 mg | INTRAMUSCULAR | Status: DC | PRN
  Administered 2018-12-20: 23:00:00 3 mg via INTRAVENOUS
  Administered 2018-12-23: 2 mg via INTRAVENOUS
  Filled 2018-12-20: qty 2
  Filled 2018-12-20 (×2): qty 1

## 2018-12-20 MED ORDER — FOLIC ACID 1 MG PO TABS
1.0000 mg | ORAL_TABLET | Freq: Every day | ORAL | Status: DC
  Administered 2018-12-21 – 2018-12-27 (×7): 1 mg via ORAL
  Filled 2018-12-20 (×7): qty 1

## 2018-12-20 MED ORDER — LORAZEPAM 2 MG/ML IJ SOLN
1.0000 mg | INTRAMUSCULAR | Status: DC | PRN
  Administered 2018-12-20 – 2018-12-24 (×3): 2 mg via INTRAVENOUS
  Filled 2018-12-20 (×2): qty 1

## 2018-12-20 MED ORDER — DEXMEDETOMIDINE HCL IN NACL 200 MCG/50ML IV SOLN
0.2000 ug/kg/h | INTRAVENOUS | Status: DC
  Administered 2018-12-20: 0.3 ug/kg/h via INTRAVENOUS
  Administered 2018-12-21: 0.7 ug/kg/h via INTRAVENOUS
  Administered 2018-12-21: 0.5 ug/kg/h via INTRAVENOUS
  Administered 2018-12-21: 0.4 ug/kg/h via INTRAVENOUS
  Administered 2018-12-21 – 2018-12-22 (×2): 0.2 ug/kg/h via INTRAVENOUS
  Administered 2018-12-22: 0.4 ug/kg/h via INTRAVENOUS
  Filled 2018-12-20 (×7): qty 50

## 2018-12-20 MED ORDER — INSULIN ASPART 100 UNIT/ML ~~LOC~~ SOLN
0.0000 [IU] | Freq: Three times a day (TID) | SUBCUTANEOUS | Status: DC
  Administered 2018-12-20: 5 [IU] via SUBCUTANEOUS
  Administered 2018-12-21: 2 [IU] via SUBCUTANEOUS
  Administered 2018-12-21: 8 [IU] via SUBCUTANEOUS
  Administered 2018-12-23: 12:00:00 11 [IU] via SUBCUTANEOUS
  Administered 2018-12-24: 8 [IU] via SUBCUTANEOUS

## 2018-12-20 MED ORDER — MIDAZOLAM HCL 2 MG/2ML IJ SOLN: 1.0000 mg | INTRAMUSCULAR | Status: DC | PRN

## 2018-12-20 NOTE — Progress Notes (Signed)
Subjective: Patient reports Patient doing better improved headache  Objective: Vital signs in last 24 hours: Temp:  [98.4 F (36.9 C)-99.9 F (37.7 C)] 99 F (37.2 C) (08/23 0400) Pulse Rate:  [107-175] 114 (08/23 0700) Resp:  [13-25] 15 (08/23 0700) BP: (118-163)/(67-119) 145/101 (08/23 0700) SpO2:  [92 %-100 %] 95 % (08/23 0700) Weight:  [79.6 kg] 79.6 kg (08/22 1820)  Intake/Output from previous day: 08/22 0701 - 08/23 0700 In: 1178.6 [I.V.:678.6; IV Piggyback:500] Out: 80 [Drains:80] Intake/Output this shift: No intake/output data recorded.  Awake alert oriented strength 5-5 drain output 80 cc  Lab Results: Recent Labs    12/19/18 1355 12/19/18 2026  WBC 11.6* 10.2  HGB 10.9* 10.9*  HCT 34.1* 31.6*  PLT 417* 380   BMET Recent Labs    12/19/18 1355  NA 138  K 3.3*  CL 100  CO2 17*  GLUCOSE 112*  BUN 6  CREATININE 0.78  CALCIUM 8.1*    Studies/Results: Ct Head Wo Contrast  Result Date: 12/20/2018 CLINICAL DATA:  Follow-up examination for subdural hemorrhage. EXAM: CT HEAD WITHOUT CONTRAST TECHNIQUE: Contiguous axial images were obtained from the base of the skull through the vertex without intravenous contrast. COMPARISON:  Prior CT from 12/19/2018. FINDINGS: Brain: Postoperative changes from interval redo left frontal burr hole craniectomy and subdural drain placement. Tip of the subdural drain well positioned overlying the left frontal convexity. Few scattered postprocedural foci of pneumocephalus noted within the left extra-axial space. Subacute to chronic left subdural collection has been partially evacuated, decreased in size now measuring up to 11 mm in maximal thickness. Persistent small amount of more acute to subacute hyperdense blood products seen within this collection. Associated mass effect on the left cerebral hemisphere is improved with improved left-to-right midline shift now measuring approximately 3 mm. No hydrocephalus or ventricular trapping.  Basilar cisterns remain patent. No other new acute intracranial hemorrhage. No large vessel territory infarct. Vascular: No unexpected hyperdense vessel. Skull: Postoperative swelling and emphysema at the left frontotemporal scalp from subdural drain placement. Underlying previous bilateral craniotomies noted. Sinuses/Orbits: Globes and orbital soft tissues within normal limits. Paranasal sinuses and mastoid air cells remain clear. Other: None. IMPRESSION: 1. Postoperative changes from interval redo left frontal burr hole craniectomy and subdural drain placement. Tip of the subdural drain well positioned overlying the left frontal convexity. 2. Subacute to chronic left subdural collection has been partially evacuated and is decreased in size, now measuring up to 11 mm in maximal thickness. Improved mass effect with improved left-to-right midline shift now measuring 3 mm. 3. No other new acute intracranial abnormality. Electronically Signed   By: Jeannine Boga M.D.   On: 12/20/2018 02:20   Ct Head Wo Contrast  Result Date: 12/19/2018 CLINICAL DATA:  New seizure, status post craniotomy for subdural hematoma EXAM: CT HEAD WITHOUT CONTRAST TECHNIQUE: Contiguous axial images were obtained from the base of the skull through the vertex without intravenous contrast. COMPARISON:  11/26/2018 FINDINGS: Brain: There is a mostly low-attenuation subdural collection about the left frontal lobe with minimal, layering high attenuation internally and containing tiny locules of internal air (series 2, image 23). Maximum coronal thickness is 2.4 cm and there is effacement of the left-sided sulci and 9 mm left right midline shift. There is no significant right-sided subdural collection. Vascular: No hyperdense vessel or unexpected calcification. Skull: Redemonstrated biparietal craniotomies. Negative for fracture or focal lesion. Sinuses/Orbits: No acute finding. Other: None. IMPRESSION: 1. There is a mostly low-attenuation  subdural collection about the  left frontal lobe with minimal, layering high attenuation internally and containing tiny locules of internal air (series 2, image 23). Maximum coronal thickness is 2.4 cm and there is effacement of the left-sided sulci and 9 mm left right midline shift. Findings are consistent with reaccumulation or recurrence of a previously evacuation subdural hematoma. 2. No significant persistent or recurrent right-sided subdural collection. 3. Redemonstrated biparietal craniotomies. Electronically Signed   By: Eddie Candle M.D.   On: 12/19/2018 14:49    Assessment/Plan: Postoperative CT with significant provement and left frontal subdural fluid collection continue external subdural drain for another 24 hours  LOS: 1 day     Lovely Kerins P 12/20/2018, 7:44 AM

## 2018-12-20 NOTE — Progress Notes (Signed)
Neurosurgey PA notified that patient is more confused, agitated, and anxious. Patient is hallucinating and has tremors as well. Patient with history of alcoholism, but states he hasn't drank any alochol in 2 weeks. Patient with scheduled beer ordered; however, he has been refusing to drink them. PA placed CIWA protocol orders and order for precedex gtt. Will continue to monitor patient.

## 2018-12-21 ENCOUNTER — Telehealth: Payer: Self-pay | Admitting: *Deleted

## 2018-12-21 LAB — GLUCOSE, CAPILLARY
Glucose-Capillary: 105 mg/dL — ABNORMAL HIGH (ref 70–99)
Glucose-Capillary: 134 mg/dL — ABNORMAL HIGH (ref 70–99)
Glucose-Capillary: 154 mg/dL — ABNORMAL HIGH (ref 70–99)
Glucose-Capillary: 185 mg/dL — ABNORMAL HIGH (ref 70–99)
Glucose-Capillary: 268 mg/dL — ABNORMAL HIGH (ref 70–99)

## 2018-12-21 MED ORDER — ENSURE ENLIVE PO LIQD
237.0000 mL | Freq: Two times a day (BID) | ORAL | Status: DC
  Administered 2018-12-23 (×2): 237 mL via ORAL

## 2018-12-21 NOTE — Telephone Encounter (Signed)
FYI  Reviewed patients chart and he did go to ER.

## 2018-12-21 NOTE — Progress Notes (Signed)
Subjective: Patient reports disoriented, sleepy, on Precedex  Objective: Vital signs in last 24 hours: Temp:  [98.2 F (36.8 C)-99.9 F (37.7 C)] 98.8 F (37.1 C) (08/24 0343) Pulse Rate:  [79-142] 81 (08/24 0600) Resp:  [12-32] 16 (08/24 0600) BP: (97-155)/(69-111) 123/87 (08/24 0600) SpO2:  [93 %-100 %] 96 % (08/24 0600)  Intake/Output from previous day: 08/23 0701 - 08/24 0700 In: 2451.1 [P.O.:240; I.V.:1811.3; IV Piggyback:399.8] Out: [id number] [Urine:3100; Drains:42] Intake/Output this shift: No intake/output data recorded.  Physical Exam: Awakens, confused as to where he is.  MAEW.  Lab Results: Recent Labs    12/19/18 1355 12/19/18 2026  WBC 11.6* 10.2  HGB 10.9* 10.9*  HCT 34.1* 31.6*  PLT 417* 380   BMET Recent Labs    12/19/18 1355  NA 138  K 3.3*  CL 100  CO2 17*  GLUCOSE 112*  BUN 6  CREATININE 0.78  CALCIUM 8.1*    Studies/Results: Ct Head Wo Contrast  Result Date: 12/20/2018 CLINICAL DATA:  Follow-up examination for subdural hemorrhage. EXAM: CT HEAD WITHOUT CONTRAST TECHNIQUE: Contiguous axial images were obtained from the base of the skull through the vertex without intravenous contrast. COMPARISON:  Prior CT from 12/19/2018. FINDINGS: Brain: Postoperative changes from interval redo left frontal burr hole craniectomy and subdural drain placement. Tip of the subdural drain well positioned overlying the left frontal convexity. Few scattered postprocedural foci of pneumocephalus noted within the left extra-axial space. Subacute to chronic left subdural collection has been partially evacuated, decreased in size now measuring up to 11 mm in maximal thickness. Persistent small amount of more acute to subacute hyperdense blood products seen within this collection. Associated mass effect on the left cerebral hemisphere is improved with improved left-to-right midline shift now measuring approximately 3 mm. No hydrocephalus or ventricular trapping. Basilar cisterns  remain patent. No other new acute intracranial hemorrhage. No large vessel territory infarct. Vascular: No unexpected hyperdense vessel. Skull: Postoperative swelling and emphysema at the left frontotemporal scalp from subdural drain placement. Underlying previous bilateral craniotomies noted. Sinuses/Orbits: Globes and orbital soft tissues within normal limits. Paranasal sinuses and mastoid air cells remain clear. Other: None. IMPRESSION: 1. Postoperative changes from interval redo left frontal burr hole craniectomy and subdural drain placement. Tip of the subdural drain well positioned overlying the left frontal convexity. 2. Subacute to chronic left subdural collection has been partially evacuated and is decreased in size, now measuring up to 11 mm in maximal thickness. Improved mass effect with improved left-to-right midline shift now measuring 3 mm. 3. No other new acute intracranial abnormality. Electronically Signed   By: Jeannine Boga M.D.   On: 12/20/2018 02:20   Ct Head Wo Contrast  Result Date: 12/19/2018 CLINICAL DATA:  New seizure, status post craniotomy for subdural hematoma EXAM: CT HEAD WITHOUT CONTRAST TECHNIQUE: Contiguous axial images were obtained from the base of the skull through the vertex without intravenous contrast. COMPARISON:  11/26/2018 FINDINGS: Brain: There is a mostly low-attenuation subdural collection about the left frontal lobe with minimal, layering high attenuation internally and containing tiny locules of internal air (series 2, image 23). Maximum coronal thickness is 2.4 cm and there is effacement of the left-sided sulci and 9 mm left right midline shift. There is no significant right-sided subdural collection. Vascular: No hyperdense vessel or unexpected calcification. Skull: Redemonstrated biparietal craniotomies. Negative for fracture or focal lesion. Sinuses/Orbits: No acute finding. Other: None. IMPRESSION: 1. There is a mostly low-attenuation subdural  collection about the left frontal lobe with minimal,  layering high attenuation internally and containing tiny locules of internal air (series 2, image 23). Maximum coronal thickness is 2.4 cm and there is effacement of the left-sided sulci and 9 mm left right midline shift. Findings are consistent with reaccumulation or recurrence of a previously evacuation subdural hematoma. 2. No significant persistent or recurrent right-sided subdural collection. 3. Redemonstrated biparietal craniotomies. Electronically Signed   By: Eddie Candle M.D.   On: 12/19/2018 14:49    Assessment/Plan: Recurrent SDH, improved CT with drain still in place.  Bloody drainage persists.  Will keep in today.  Will attempt to wean sedation today.    LOS: 2 days    Peggyann Shoals, MD 12/21/2018, 7:58 AM

## 2018-12-21 NOTE — Progress Notes (Signed)
Initial Nutrition Assessment  DOCUMENTATION CODES:   Not applicable  INTERVENTION:   Ensure Enlive po BID, each supplement provides 350 kcal and 20 grams of protein  Encourage PO intake    NUTRITION DIAGNOSIS:   Inadequate oral intake related to lethargy/confusion as evidenced by meal completion < 50%.  GOAL:   Patient will meet greater than or equal to 90% of their needs  MONITOR:   PO intake, Supplement acceptance  REASON FOR ASSESSMENT:   Malnutrition Screening Tool    ASSESSMENT:   Pt with PMH of alcohol abuse, recurrent alcoholic pancreatitis, DM, depression, HTN who one month ago had bilateral craniotomies for SDH and now admitted with recurrent chronic SDH.   Pt s/p subdural drain placement, pt confused on precedex.   Medications reviewed and include: SSI, lantus, thiamine  Labs reviewed Drain: 42 ml    NUTRITION - FOCUSED PHYSICAL EXAM:  Deferred; RD working remotely  Diet Order:   Diet Order            Diet Heart Room service appropriate? Yes with Assist; Fluid consistency: Thin  Diet effective now              EDUCATION NEEDS:   No education needs have been identified at this time  Skin:  Skin Assessment: Reviewed RN Assessment  Last BM:  unknown  Height:   Ht Readings from Last 1 Encounters:  12/19/18 6' (1.829 m)    Weight:   Wt Readings from Last 1 Encounters:  12/19/18 79.6 kg    Ideal Body Weight:  80.9 kg  BMI:  Body mass index is 23.8 kg/m.  Estimated Nutritional Needs:   Kcal:  2000-220  Protein:  100-110 grams  Fluid:  > 2L /day  Maylon Peppers RD, [place], [place] Pager [phone] After Hours Pager

## 2018-12-21 NOTE — Telephone Encounter (Signed)
Who Is Calling Patient / Member / Family / Caregiver Call Type Triage / Clinical Caller Name n/a Relationship To Patient Other Return Phone Number [phone] (Secondary) Chief Complaint Post-op Incision Symptoms Reason for Call Symptomatic / Request for Health Information Initial Comment Caller states she is calling for patient, where the stitches was taken out one side is purple and other side looks pale is this normal? Caller did not verify phone numbers they disconnected. Charge said urgent. Translation No Nurse Assessment Nurse: Neena Rhymes, RN, Sharyn Lull Date/Time (Eastern Time): 12/19/2018 1:01:48 PM Confirm and document reason for call. If symptomatic, describe symptoms. ---Caller states she is calling for patient, where the staples was taken out left side of head is purple and other side looks pale Staples removed Monday. Asaun gave me permission to speak with [facility]. Denies drainage. States it it swollen.  Disp. Time Eilene Ghazi Time) Disposition Final User 12/19/2018 12:57:31 PM Send to Urgent Skip Estimable 12/19/2018 12:59:50 PM Attempt made - message left Nigel Mormon 12/19/2018 1:09:33 PM Go to ED Now Yes Neena Rhymes, RN, Sharyn Lull

## 2018-12-21 NOTE — Progress Notes (Signed)
Neurosurgery PA also notified that drain has had no output since the beginning of my shift. PA stated this was okay. Will continue to monitor.

## 2018-12-21 NOTE — Progress Notes (Signed)
Patient refused scheduled ethanol (beer). This RN wasted the beer. Witnessed by [employer] RN.

## 2018-12-22 ENCOUNTER — Inpatient Hospital Stay (HOSPITAL_COMMUNITY): Payer: Self-pay

## 2018-12-22 LAB — BASIC METABOLIC PANEL
Anion gap: 12 (ref 5–15)
BUN: <5 mg/dL — ABNORMAL LOW (ref 6–20)
CO2: 26 mmol/L (ref 22–32)
Calcium: 8.4 mg/dL — ABNORMAL LOW (ref 8.9–10.3)
Chloride: 94 mmol/L — ABNORMAL LOW (ref 98–111)
Creatinine, Ser: 0.49 mg/dL — ABNORMAL LOW (ref 0.61–1.24)
GFR calc Af Amer: >60 mL/min (ref >60)
GFR calc non Af Amer: >60 mL/min (ref >60)
Glucose, Bld: 80 mg/dL (ref 70–99)
Potassium: 4.1 mmol/L (ref 3.5–5.1)
Sodium: 132 mmol/L — ABNORMAL LOW (ref 135–145)

## 2018-12-22 LAB — GLUCOSE, CAPILLARY
Glucose-Capillary: 130 mg/dL — ABNORMAL HIGH (ref 70–99)
Glucose-Capillary: 32 mg/dL — CL (ref 70–99)
Glucose-Capillary: 47 mg/dL — ABNORMAL LOW (ref 70–99)
Glucose-Capillary: 135 mg/dL — ABNORMAL HIGH (ref 70–99)
Glucose-Capillary: 81 mg/dL (ref 70–99)
Glucose-Capillary: 161 mg/dL — ABNORMAL HIGH (ref 70–99)
Glucose-Capillary: 184 mg/dL — ABNORMAL HIGH (ref 70–99)

## 2018-12-22 LAB — CBC WITH DIFFERENTIAL/PLATELET
Abs Immature Granulocytes: 0.03 10*3/uL (ref 0.00–0.07)
Basophils Absolute: 0 10*3/uL (ref 0.0–0.1)
Basophils Relative: 0 %
Eosinophils Absolute: 0 10*3/uL (ref 0.0–0.5)
Eosinophils Relative: 0 %
HCT: 32.3 % — ABNORMAL LOW (ref 39.0–52.0)
Hemoglobin: 11.2 g/dL — ABNORMAL LOW (ref 13.0–17.0)
Immature Granulocytes: 0 %
Lymphocytes Relative: 6 %
Lymphs Abs: 0.6 10*3/uL — ABNORMAL LOW (ref 0.7–4.0)
MCH: 29.6 pg (ref 26.0–34.0)
MCHC: 34.7 g/dL (ref 30.0–36.0)
MCV: 85.4 fL (ref 80.0–100.0)
Monocytes Absolute: 0.5 10*3/uL (ref 0.1–1.0)
Monocytes Relative: 5 %
Neutro Abs: 8.3 10*3/uL — ABNORMAL HIGH (ref 1.7–7.7)
Neutrophils Relative %: 89 %
Platelets: 318 10*3/uL (ref 150–400)
RBC: 3.78 MIL/uL — ABNORMAL LOW (ref 4.22–5.81)
RDW: 12.4 % (ref 11.5–15.5)
WBC: 9.4 10*3/uL (ref 4.0–10.5)
nRBC: 0 % (ref 0.0–0.2)

## 2018-12-22 LAB — CBG MONITORING, ED: Glucose-Capillary: 35 mg/dL — CL (ref 70–99)

## 2018-12-22 MED ORDER — INSULIN GLARGINE 100 UNIT/ML ~~LOC~~ SOLN
10.0000 [IU] | Freq: Every day | SUBCUTANEOUS | Status: DC
  Administered 2018-12-22 – 2018-12-23 (×2): 10 [IU] via SUBCUTANEOUS
  Filled 2018-12-22 (×3): qty 0.1

## 2018-12-22 MED ORDER — DEXTROSE 50 % IV SOLN
25.0000 g | Freq: Once | INTRAVENOUS | Status: AC
  Administered 2018-12-22: 08:00:00 25 g via INTRAVENOUS

## 2018-12-22 MED ORDER — DEXTROSE 50 % IV SOLN
1.0000 | Freq: Once | INTRAVENOUS | Status: AC
  Administered 2018-12-22: 12:00:00 50 mL via INTRAVENOUS

## 2018-12-22 MED ORDER — LEVETIRACETAM IN NACL 500 MG/100ML IV SOLN
500.0000 mg | Freq: Once | INTRAVENOUS | Status: AC
  Administered 2018-12-22: 500 mg via INTRAVENOUS

## 2018-12-22 MED ORDER — DEXTROSE 50 % IV SOLN
INTRAVENOUS | Status: AC
  Administered 2018-12-22: 12:00:00 50 mL via INTRAVENOUS
  Filled 2018-12-22: qty 50

## 2018-12-22 MED ORDER — DEXTROSE 50 % IV SOLN
INTRAVENOUS | Status: AC
  Administered 2018-12-22: 08:00:00 25 g via INTRAVENOUS
  Filled 2018-12-22: qty 50

## 2018-12-22 NOTE — Progress Notes (Signed)
Inpatient Diabetes Program Recommendations  AACE/ADA: New Consensus Statement on Inpatient Glycemic Control (2015)  Target Ranges:  Prepandial:   less than 140 mg/dL      Peak postprandial:   less than 180 mg/dL (1-2 hours)      Critically ill patients:  140 - 180 mg/dL   Lab Results  Component Value Date   GLUCAP 130 (H) 12/22/2018   HGBA1C 6.9 (H) 11/23/2018    Review of Glycemic Control Results for Willie [name], Willie [name] (MRN [id number]) as of 12/22/2018 09:39  Ref. Range 12/21/2018 22:24 12/22/2018 08:02 12/22/2018 08:28  Glucose-Capillary Latest Ref Range: 70 - 99 mg/dL 185 (H) 32 (LL) 130 (H)   Diabetes history: Type 2 DM Outpatient Diabetes medications: lantus 25 units QHS, Humalog 09/05/13 units TID Current orders for Inpatient glycemic control: Lantus 20 units QHS, Novolog 0-15 units TID  Inpatient Diabetes Program Recommendations:    Noted hypoglycemic episode of 32 mg/dl this AM. Consider reducing Lantus to 10 units QHS and decreasing correction to Novolog 0-9 units TID.   Thanks, Bronson Curb, MSN, RNC-OB Diabetes Coordinator [phone] (8a-5p)

## 2018-12-22 NOTE — Progress Notes (Signed)
Spoke to NP [facility]. Made her aware CT of head is done. Patient also pulled subdural drain out. NP made aware. NP made aware that patient is getting agitated, interfering with care, and attempting to get out of bed against safety precautions. New orders received for belt and bilateral wrist restraints. Will continue to monitor.

## 2018-12-22 NOTE — Progress Notes (Signed)
Inpatient Diabetes Program Recommendations  AACE/ADA: New Consensus Statement on Inpatient Glycemic Control   Target Ranges:  Prepandial:   less than 140 mg/dL      Peak postprandial:   less than 180 mg/dL (1-2 hours)      Critically ill patients:  140 - 180 mg/dL   Results for GURBIR, MENDER (MRN [id number]) as of 12/22/2018 14:57  Ref. Range 12/22/2018 08:02 12/22/2018 08:28 12/22/2018 11:55 12/22/2018 12:37  Glucose-Capillary Latest Ref Range: 70 - 99 mg/dL 32 (LL)  D50 130 (H) 47 (L)  D50 135 (H)  Results for DALI, SIMONIAN (MRN [id number]) as of 12/22/2018 14:57  Ref. Range 12/21/2018 08:19 12/21/2018 12:07 12/21/2018 17:17 12/21/2018 21:20 12/21/2018 22:24  Glucose-Capillary Latest Ref Range: 70 - 99 mg/dL 134 (H)  Novolog 2 units 105 (H) 268 (H)  Novolog 8 units   Lantus 20 units 185 (H)   Review of Glycemic Control  Diabetes history: DM2 Outpatient Diabetes medications: Lantus 25 units QHS, Humalog 09/05/13 units TID with meals Current orders for Inpatient glycemic control: Lantus 10 units QHS, Novolog 0-15 units TID with meals  Inpatient Diabetes Program Recommendations:   Insulin-Basal: Due to recurrent hypoglycemia, recommend discontinuing Lantus at this time and use Novolog correction if needed.  Insulin-Correction: Please consider ordering CBGs Q2H until hypoglycemia is completely resolved and then change to Q4H. Please change Novolog correction to 0-9 units Q4H so that glucose can be corrected Q4H if needed once hypoglycemia completely resolved.  IV Fluids: RN reports patient is not eating today due to being lethargic. Recommend adding dextrose to IV fluids due to recurrent hypoglycemia today.  NOTE: Received page from [place], [place] regarding recurrent hypoglycemia patient is experiencing today. Patient received Lantus 20 units last night and fasting glucose 32 mg/dl which was treated with D50 then following glucose 47 mg/dl at 11:55 which was treated with D50 as well.  Patient has had a change in neurological status today and RN concerned change is due to hypoglycemia. Advised that since he received Lantus last night it will impact glucose for about 24 hours, would recommend checking glucose Q2H for now to monitor glucose more frequently, asking MD about adding dextrose to IV fluids, discontinuing Lantus for now, and changing Novolog to sensitive scale and change to Q4H for when hypoglycemia is completely resolved.  Eunice Blase, RN to contact attending MD regarding recommendations.   Thanks, Barnie Alderman, RN, MSN, CDE Diabetes Coordinator Inpatient Diabetes Program [phone] (Team Pager from 8am to 5pm)

## 2018-12-22 NOTE — Progress Notes (Signed)
Spoke to neurosurgery NP Reinaldo Meeker about patient's speech changes. Also let the NP know that the drain may have clotted off, as it is not putting out much output. New orders for a head CT. Will continue to monitor.

## 2018-12-22 NOTE — Progress Notes (Signed)
Subjective: Patient reports delirious  Objective: Vital signs in last 24 hours: Temp:  [97.9 F (36.6 C)-99.9 F (37.7 C)] 99 F (37.2 C) (08/25 0400) Pulse Rate:  [75-103] 79 (08/25 0700) Resp:  [11-24] 15 (08/25 0700) BP: (96-143)/(58-103) 122/88 (08/25 0700) SpO2:  [95 %-100 %] 98 % (08/25 0700)  Intake/Output from previous day: 08/24 0701 - 08/25 0700 In: 3026.5 [P.O.:720; I.V.:1906.4; IV Piggyback:400.1] Out: 2450 [Urine:2440; Drains:10] Intake/Output this shift: No intake/output data recorded.  Physical Exam: Awakens, nods, confused.  MAEW  Lab Results: Recent Labs    12/19/18 1355 12/19/18 2026  WBC 11.6* 10.2  HGB 10.9* 10.9*  HCT 34.1* 31.6*  PLT 417* 380   BMET Recent Labs    12/19/18 1355  NA 138  K 3.3*  CL 100  CO2 17*  GLUCOSE 112*  BUN 6  CREATININE 0.78  CALCIUM 8.1*    Studies/Results: Ct Head Wo Contrast  Result Date: 12/22/2018 CLINICAL DATA:  Subacute neuro deficit EXAM: CT HEAD WITHOUT CONTRAST TECHNIQUE: Contiguous axial images were obtained from the base of the skull through the vertex without intravenous contrast. COMPARISON:  Two days ago FINDINGS: Brain: Subdural hematoma with evacuation on the left. A subdural drain remains in place. The collection has increased from 11 to 15 mm, with further frontal mass effect, but no interval high-density hematoma. No infarct, hydrocephalus, or herniation. Midline shift measures 3-4 mm. Vascular: No hyperdense vessel or unexpected calcification. Skull: Small bilateral craniotomy. Sinuses/Orbits: Negative IMPRESSION: Increased left subdural hematoma but still low-density. The collection is increased from 11 to 15 mm in thickness and midline shift measures 3-4 mm. Electronically Signed   By: Monte Fantasia M.D.   On: 12/22/2018 05:06    Assessment/Plan: Head CT improved from preop.  Patient on precedex for likely withdrawal.  Will try to wean off today.  Patient removed his subdural catheter.  Will  observe.    LOS: 3 days    Peggyann Shoals, MD 12/22/2018, 7:55 AM

## 2018-12-22 NOTE — Progress Notes (Signed)
Subjective: Patient reports Somnolent lethargic  Objective: Vital signs in last 24 hours: Temp:  [98.5 F (36.9 C)-99.9 F (37.7 C)] 99 F (37.2 C) (08/25 0400) Pulse Rate:  [75-103] 79 (08/25 0700) Resp:  [11-24] 15 (08/25 0700) BP: (96-143)/(58-95) 122/88 (08/25 0700) SpO2:  [95 %-100 %] 98 % (08/25 0700)  Intake/Output from previous day: 08/24 0701 - 08/25 0700 In: 3026.5 [P.O.:720; I.V.:1906.4; IV Piggyback:400.1] Out: 2450 [Urine:2440; Drains:10] Intake/Output this shift: No intake/output data recorded.  Patient somnolent moves all extremities good agitated this morning pulled out his drain  Lab Results: Recent Labs    12/19/18 1355 12/19/18 2026  WBC 11.6* 10.2  HGB 10.9* 10.9*  HCT 34.1* 31.6*  PLT 417* 380   BMET Recent Labs    12/19/18 1355  NA 138  K 3.3*  CL 100  CO2 17*  GLUCOSE 112*  BUN 6  CREATININE 0.78  CALCIUM 8.1*    Studies/Results: Ct Head Wo Contrast  Result Date: 12/22/2018 CLINICAL DATA:  Subacute neuro deficit EXAM: CT HEAD WITHOUT CONTRAST TECHNIQUE: Contiguous axial images were obtained from the base of the skull through the vertex without intravenous contrast. COMPARISON:  Two days ago FINDINGS: Brain: Subdural hematoma with evacuation on the left. A subdural drain remains in place. The collection has increased from 11 to 15 mm, with further frontal mass effect, but no interval high-density hematoma. No infarct, hydrocephalus, or herniation. Midline shift measures 3-4 mm. Vascular: No hyperdense vessel or unexpected calcification. Skull: Small bilateral craniotomy. Sinuses/Orbits: Negative IMPRESSION: Increased left subdural hematoma but still low-density. The collection is increased from 11 to 15 mm in thickness and midline shift measures 3-4 mm. Electronically Signed   By: Monte Fantasia M.D.   On: 12/22/2018 05:06    Assessment/Plan: No significant change in subdural fluid collection on follow-up CT scan patient pulled out his  subdural drain still fairly out of it on Precedex for withdrawal.  LOS: 3 days     Willie [name] P 12/22/2018, 8:00 AM

## 2018-12-23 LAB — GLUCOSE, CAPILLARY
Glucose-Capillary: 172 mg/dL — ABNORMAL HIGH (ref 70–99)
Glucose-Capillary: 120 mg/dL — ABNORMAL HIGH (ref 70–99)
Glucose-Capillary: 313 mg/dL — ABNORMAL HIGH (ref 70–99)
Glucose-Capillary: 39 mg/dL — CL (ref 70–99)
Glucose-Capillary: 57 mg/dL — ABNORMAL LOW (ref 70–99)
Glucose-Capillary: 69 mg/dL — ABNORMAL LOW (ref 70–99)
Glucose-Capillary: 94 mg/dL (ref 70–99)
Glucose-Capillary: 215 mg/dL — ABNORMAL HIGH (ref 70–99)
Glucose-Capillary: 240 mg/dL — ABNORMAL HIGH (ref 70–99)

## 2018-12-23 NOTE — Progress Notes (Signed)
Subjective: Patient reports Patient feels better today still complaining of headache but much more awake and alert  Objective: Vital signs in last 24 hours: Temp:  [99.5 F (37.5 C)-101 F (38.3 C)] 99.5 F (37.5 C) (08/26 1200) Pulse Rate:  [70-146] 98 (08/26 1600) Resp:  [9-26] 20 (08/26 1600) BP: (91-151)/(59-101) 136/90 (08/26 1600) SpO2:  [92 %-100 %] 95 % (08/26 1600)  Intake/Output from previous day: 08/25 0701 - 08/26 0700 In: 2347.9 [P.O.:500; I.V.:1345.7; IV Piggyback:502.2] Out: 4475 [Urine:4475] Intake/Output this shift: Total I/O In: 802.5 [I.V.:602.5; IV Piggyback:200] Out: 600 [Urine:600]  Awake alert oriented x2 strength 5 out of 5  Lab Results: Recent Labs    12/22/18 1529  WBC 9.4  HGB 11.2*  HCT 32.3*  PLT 318   BMET Recent Labs    12/22/18 1447  NA 132*  K 4.1  CL 94*  CO2 26  GLUCOSE 80  BUN <5*  CREATININE 0.49*  CALCIUM 8.4*    Studies/Results: Ct Head Wo Contrast  Result Date: 12/22/2018 CLINICAL DATA:  Altered mental status. Difficulty speaking. Subdural hematoma. EXAM: CT HEAD WITHOUT CONTRAST TECHNIQUE: Contiguous axial images were obtained from the base of the skull through the vertex without intravenous contrast. COMPARISON:  12/22/2018 FINDINGS: Brain: Left subdural drain has been removed. Thickness of the subdural material in the left frontal region is essentially unchanged at about 13 mm. Mass effect is similar with left-to-right shift of 3 mm. No additional bleeding identified. Brain parenchyma itself continues to be normal. No hydrocephalus. Vascular: No abnormal vascular finding. Skull: Bilateral craniotomy changes are stable. Sinuses/Orbits: Clear Other: None IMPRESSION: Left subdural drain removal. Stable left frontal subdural hematoma maximal thickness 13 mm with stable mass effect and 3 mm left-to-right shift. Electronically Signed   By: Nelson Chimes M.D.   On: 12/22/2018 14:02   Ct Head Wo Contrast  Result Date:  12/22/2018 CLINICAL DATA:  Subacute neuro deficit EXAM: CT HEAD WITHOUT CONTRAST TECHNIQUE: Contiguous axial images were obtained from the base of the skull through the vertex without intravenous contrast. COMPARISON:  Two days ago FINDINGS: Brain: Subdural hematoma with evacuation on the left. A subdural drain remains in place. The collection has increased from 11 to 15 mm, with further frontal mass effect, but no interval high-density hematoma. No infarct, hydrocephalus, or herniation. Midline shift measures 3-4 mm. Vascular: No hyperdense vessel or unexpected calcification. Skull: Small bilateral craniotomy. Sinuses/Orbits: Negative IMPRESSION: Increased left subdural hematoma but still low-density. The collection is increased from 11 to 15 mm in thickness and midline shift measures 3-4 mm. Electronically Signed   By: Monte Fantasia M.D.   On: 12/22/2018 05:06    Assessment/Plan: Postop day 5 placement subdural drain patient discontinued his subdural drain yesterday morning postop CT scan when the patient was confused that showed small remaining left frontal subdural fluid collection with minimal mass-effect.  Continue to treat the patient with antiepileptics continue to wean off his benzodiazepines and monitor for withdrawal.  Physical Occupational Therapy probable transfer to floor tomorrow  LOS: 4 days     Anissia Wessells P 12/23/2018, 4:34 PM

## 2018-12-23 NOTE — Evaluation (Signed)
Physical Therapy Evaluation Patient Details Name: Willie [name] MRN: [id number] DOB: May 14, 1975 Today's Date: 12/23/2018   History of Present Illness  Pt is a 43 y.o. M with significant PMH of hypertension, type 2 diabetes mellitus, depression, anxiety, history of alcohol abuse. He is 1 month out from bilateral craniotomies for subdural with a recurrent large chronic on the left. 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 S/p placement of subdural drain, which pt removed yesterday, 8/26.   Clinical Impression  Pt admitted with above. Pt reporting right arm/hand weakness, but otherwise states he feels back to his functional baseline. Upon my assessment, right arm strength 5/5 and pt demonstrated normal coordination with fine motor tasks. Pt ambulating hallway distances with no assistive device at a supervision level. Able to perform high level balance activities without overt loss of balance. Will continue to follow acutely to assess higher level cognition and for stair training. No expected PT follow up needs.     Follow Up Recommendations No PT follow up    Equipment Recommendations  None recommended by PT    Recommendations for Other Services       Precautions / Restrictions Precautions Precautions: None Restrictions Weight Bearing Restrictions: No      Mobility  Bed Mobility Overal bed mobility: Independent                Transfers Overall transfer level: Independent Equipment used: None                Ambulation/Gait Ambulation/Gait assistance: Supervision Gait Distance (Feet): 300 Feet Assistive device: None Gait Pattern/deviations: WFL(Within Functional Limits) Gait velocity: decreased Gait velocity interpretation: 1.31 - 2.62 ft/sec, indicative of limited community ambulator General Gait Details: No gross unsteadiness, decreased gait speed for age. able to perform  head turns, stepping over obstacles, stops/starts without overt LOB  Stairs            Wheelchair Mobility    Modified Rankin (Stroke Patients Only) Modified Rankin (Stroke Patients Only) Pre-Morbid Rankin Score: No symptoms Modified Rankin: Moderately severe disability     Balance Overall balance assessment: Mild deficits observed, not formally tested                                           Pertinent Vitals/Pain Pain Assessment: No/denies pain    Home Living Family/patient expects to be discharged to:: Private residence Living Arrangements: Spouse/significant other Available Help at Discharge: Family Type of Home: Apartment Home Access: Stairs to enter     Home Layout: One level        Prior Function Level of Independence: Independent         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, newspaper        Extremity/Trunk Assessment   Upper Extremity Assessment Upper Extremity Assessment: RUE deficits/detail;LUE deficits/detail RUE Deficits / Details: Strength 5/5 RUE Coordination: WNL LUE Deficits / Details: Strength 5/5 LUE Coordination: WNL    Lower Extremity Assessment Lower Extremity Assessment: Overall WFL for tasks assessed    Cervical / Trunk Assessment Cervical / Trunk Assessment: Normal  Communication   Communication: No difficulties  Cognition Arousal/Alertness: Awake/alert Behavior During Therapy: WFL for tasks assessed/performed Overall Cognitive Status: Within Functional Limits for tasks assessed  General Comments  BP 135/94 (105), HR peak 126 bpm    Exercises     Assessment/Plan    PT Assessment Patient needs continued PT services  PT Problem List Decreased balance;Decreased mobility       PT Treatment Interventions Gait training;Stair training;Functional mobility training;Therapeutic activities;Therapeutic exercise;Balance training;Patient/family  education    PT Goals (Current goals can be found in the Care Plan section)  Acute Rehab PT Goals Patient Stated Goal: none stated; agreeable to therapy PT Goal Formulation: With patient Time For Goal Achievement: 01/06/19 Potential to Achieve Goals: Good    Frequency Min 3X/week   Barriers to discharge        Co-evaluation               AM-PAC PT "6 Clicks" Mobility  Outcome Measure Help needed turning from your back to your side while in a flat bed without using bedrails?: None Help needed moving from lying on your back to sitting on the side of a flat bed without using bedrails?: None Help needed moving to and from a bed to a chair (including a wheelchair)?: None Help needed standing up from a chair using your arms (e.g., wheelchair or bedside chair)?: None Help needed to walk in hospital room?: None Help needed climbing 3-5 steps with a railing? : A Little 6 Click Score: 23    End of Session   Activity Tolerance: Patient tolerated treatment well Patient left: in bed;with call bell/phone within reach Nurse Communication: Mobility status PT Visit Diagnosis: Unsteadiness on feet (R26.81)    Time: [id number] PT Time Calculation (min) (ACUTE ONLY): 14 min   Charges:   PT Evaluation $PT Eval Moderate Complexity: 1 Mod         Ellamae Sia, PT, DPT Acute Rehabilitation Services Pager [phone] Office [phone]   [name] 12/23/2018, 12:45 PM

## 2018-12-24 DIAGNOSIS — E11649 Type 2 diabetes mellitus with hypoglycemia without coma: Secondary | ICD-10-CM

## 2018-12-24 DIAGNOSIS — I10 Essential (primary) hypertension: Secondary | ICD-10-CM

## 2018-12-24 DIAGNOSIS — E1165 Type 2 diabetes mellitus with hyperglycemia: Secondary | ICD-10-CM

## 2018-12-24 LAB — GLUCOSE, CAPILLARY
Glucose-Capillary: 138 mg/dL — ABNORMAL HIGH (ref 70–99)
Glucose-Capillary: 89 mg/dL (ref 70–99)
Glucose-Capillary: 282 mg/dL — ABNORMAL HIGH (ref 70–99)
Glucose-Capillary: 122 mg/dL — ABNORMAL HIGH (ref 70–99)
Glucose-Capillary: 227 mg/dL — ABNORMAL HIGH (ref 70–99)

## 2018-12-24 MED ORDER — INSULIN GLARGINE 100 UNIT/ML ~~LOC~~ SOLN
10.0000 [IU] | Freq: Every day | SUBCUTANEOUS | Status: DC
  Administered 2018-12-24 – 2018-12-26 (×3): 10 [IU] via SUBCUTANEOUS
  Filled 2018-12-24 (×4): qty 0.1

## 2018-12-24 MED ORDER — CLONIDINE HCL 0.1 MG PO TABS
0.1000 mg | ORAL_TABLET | Freq: Three times a day (TID) | ORAL | Status: DC
  Administered 2018-12-24 – 2018-12-27 (×9): 0.1 mg via ORAL
  Filled 2018-12-24 (×9): qty 1

## 2018-12-24 MED ORDER — INSULIN ASPART 100 UNIT/ML ~~LOC~~ SOLN
3.0000 [IU] | Freq: Three times a day (TID) | SUBCUTANEOUS | Status: DC
  Administered 2018-12-25 (×2): 3 [IU] via SUBCUTANEOUS

## 2018-12-24 MED ORDER — INSULIN ASPART 100 UNIT/ML ~~LOC~~ SOLN
0.0000 [IU] | Freq: Three times a day (TID) | SUBCUTANEOUS | Status: DC
  Administered 2018-12-24: 17:00:00 1 [IU] via SUBCUTANEOUS

## 2018-12-24 MED ORDER — GLUCERNA SHAKE PO LIQD
237.0000 mL | Freq: Three times a day (TID) | ORAL | Status: DC
  Administered 2018-12-24 – 2018-12-27 (×8): 237 mL via ORAL

## 2018-12-24 MED ORDER — INSULIN ASPART 100 UNIT/ML ~~LOC~~ SOLN: 0.0000 [IU] | Freq: Three times a day (TID) | SUBCUTANEOUS | Status: DC

## 2018-12-24 MED ORDER — INSULIN GLARGINE 100 UNIT/ML ~~LOC~~ SOLN
15.0000 [IU] | Freq: Every day | SUBCUTANEOUS | Status: DC
  Filled 2018-12-24: qty 0.15

## 2018-12-24 NOTE — Progress Notes (Signed)
Subjective: Patient reports Doing well still with headache but much more awake alert appropriate  Objective: Vital signs in last 24 hours: Temp:  [98.7 F (37.1 C)-100 F (37.8 C)] 99.4 F (37.4 C) (08/27 0400) Pulse Rate:  [90-114] 93 (08/27 0700) Resp:  [11-29] 14 (08/27 0700) BP: (102-148)/(70-106) 109/70 (08/27 0700) SpO2:  [90 %-100 %] 99 % (08/27 0700)  Intake/Output from previous day: 08/26 0701 - 08/27 0700 In: 2067.2 [I.V.:1667.2; IV Piggyback:400] Out: 2175 [Urine:2175] Intake/Output this shift: No intake/output data recorded.  Awake alert oriented moves all extremities well  Lab Results: Recent Labs    12/22/18 1529  WBC 9.4  HGB 11.2*  HCT 32.3*  PLT 318   BMET Recent Labs    12/22/18 1447  NA 132*  K 4.1  CL 94*  CO2 26  GLUCOSE 80  BUN <5*  CREATININE 0.49*  CALCIUM 8.4*    Studies/Results: Ct Head Wo Contrast  Result Date: 12/22/2018 CLINICAL DATA:  Altered mental status. Difficulty speaking. Subdural hematoma. EXAM: CT HEAD WITHOUT CONTRAST TECHNIQUE: Contiguous axial images were obtained from the base of the skull through the vertex without intravenous contrast. COMPARISON:  12/22/2018 FINDINGS: Brain: Left subdural drain has been removed. Thickness of the subdural material in the left frontal region is essentially unchanged at about 13 mm. Mass effect is similar with left-to-right shift of 3 mm. No additional bleeding identified. Brain parenchyma itself continues to be normal. No hydrocephalus. Vascular: No abnormal vascular finding. Skull: Bilateral craniotomy changes are stable. Sinuses/Orbits: Clear Other: None IMPRESSION: Left subdural drain removal. Stable left frontal subdural hematoma maximal thickness 13 mm with stable mass effect and 3 mm left-to-right shift. Electronically Signed   By: Nelson Chimes M.D.   On: 12/22/2018 14:02    Assessment/Plan: Status post subdural drain placement for recurrent chronic subdural.  Patient doing much  better will transfer to the floor to medicine evaluate for rapid swings in blood glucose levels.  Physical occupational therapy  LOS: 5 days     Willie [name] P 12/24/2018, 7:31 AM

## 2018-12-24 NOTE — Progress Notes (Signed)
Inpatient Diabetes Program Recommendations  AACE/ADA: New Consensus Statement on Inpatient Glycemic Control (2015)  Target Ranges:  Prepandial:   less than 140 mg/dL      Peak postprandial:   less than 180 mg/dL (1-2 hours)      Critically ill patients:  140 - 180 mg/dL   Lab Results  Component Value Date   GLUCAP 89 12/24/2018   HGBA1C 6.9 (H) 11/23/2018    Review of Glycemic Control Results for AMILCAR, ZAMOR (MRN [id number]) as of 12/24/2018 09:20  Ref. Range 12/23/2018 19:13 12/23/2018 23:07 12/24/2018 03:18 12/24/2018 07:36  Glucose-Capillary Latest Ref Range: 70 - 99 mg/dL 215 (H) 240 (H) 138 (H) 89   Diabetes history: Type 2 DM Outpatient Diabetes medications: lantus 25 units QHS, Humalog 09/05/13 units TID Current orders for Inpatient glycemic control: Lantus 20 units QHS, Novolog 0-15 units TID  Inpatient Diabetes Program Recommendations:    Noted hypoglycemia yesterday afternoon of 39 mg/dL following 11 units of Novolog. Recommend decreasing correction down to Novolog 0-9 units TID. Also, noted that increases to glucoses levels are following meals when paired with Ensure Enlive (44 gm of CHO). Could consider switching supplement that contains lower carb amount.  Thanks, Bronson Curb, MSN, RNC-OB Diabetes Coordinator [phone] (8a-5p)

## 2018-12-24 NOTE — Progress Notes (Signed)
Patients came from the ICU at 1130am. Blood sugar was 282 at that time and at 4pm it was 121. He said that this is normal for him.  He seems very lethargic with slurred speech. He also states his head is constantly hurting.  Last received Oxy and Vicodin around 5.

## 2018-12-24 NOTE — Consult Note (Signed)
Medical Consultation   JONPAUL REGEHR  [id number]  DOB: 1975/08/23  DOA: 12/19/2018  PCP: Debbrah Alar, NP   Requesting physician: Dr. Saintclair Halsted from neurosurgery  Reason for consultation: Wide swings in blood glucoses and elevated blood pressures  History of Present Illness: Willie [name] is an 43 y.o. male with a past medical history of a subdural hematoma month ago, diabetes mellitus type 2, hypertension, sleep apnea, alcohol abuse with a history of alcoholic pancreatitis, depression and low back pain who presented to the hospital several days ago with change in mental status and was found to have recurrent subdural hematoma.  Ventricular drain was placed and the patient had significant improvement.  With regards to his history of alcohol use there is concerned that his blood pressures may be related to alcohol withdrawal.  Patient states he has not drank any alcohol in over a month since his subdural hematoma.  In fact he was offered beer here in the hospital and has refused to drink it.  He also reports to me that his blood glucoses are fairly labile and that he is very sensitive to regular insulin.  He also states that he has had a severe headache since admission in the areas where his ventriculostomy drain was placed.  Blood glucoses have been very labile and he is very sensitive to regular insulin coverage.  At home he uses short acting insulin and does fairly well with it.  He also takes Lantus 25 units at night.  I suspect his hospital diet is better controlled than his home diet and here he has been receiving 10 units of Lantus.  Diabetes coordinator has been suggesting changes in his regimen but I believe that he is very sensitive to the regular insulin.   Review of Systems:  As per HPI otherwise 10 point review of systems negative.     Past Medical History: Past Medical History:  Diagnosis Date  . Alcohol abuse   . Alcoholic pancreatitis    recurrent  . Depression   . Diabetes mellitus, type II ([place])    New Onset 03/2010  . GERD (gastroesophageal reflux disease)   . High cholesterol   . 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 Hypertension   . Sleep apnea    does not wear a CPAP    Past Surgical History: Past Surgical History:  Procedure Laterality Date  . COLONOSCOPY W/ BIOPSIES  09/2010   Dr. Ardis Hughs.  for intermittent rectal bleeding.  Mild sigmoid to descending diverticulosis.  Mild left colon erythema, benign biopsy, probably "prep effect"  . COLONOSCOPY WITH PROPOFOL N/A 07/06/2017   Procedure: COLONOSCOPY WITH PROPOFOL;  Surgeon: Jerene Bears, MD;  Location: [facility];  Service: Gastroenterology;  Laterality: N/A;  . CRANIOTOMY Bilateral 11/24/2018   Procedure: Bilateral craniotomy for evacuation of subdural hematoma;  Surgeon: Erline Levine, MD;  Location: [facility];  Service: Neurosurgery;  Laterality: Bilateral;  Bilateral craniotomy for evacuation of subdural hematoma  . LUMBAR MICRODISCECTOMY  ~ 2004   Dr Ellene Route     Allergies:   Allergies  Allergen Reactions  . Invokamet [Canagliflozin-Metformin Hcl] Other (See Comments)    Lactic acidosis  . Metformin And Related Other (See Comments)    DRASTIC drop in blood sugar     Social History:  reports that he has quit smoking. His smoking use  included cigarettes. He started smoking about 2 years ago. He has a 0.25 pack-year smoking history. He has never used smokeless tobacco. He reports current alcohol use. He reports previous drug use.   Family History: Family History  Problem Relation Age of Onset  . Diabetes Mother   . Lung cancer Brother        twin brother  . Pancreatic cancer Paternal Aunt   . Colon cancer Neg Hx   . Stomach cancer Neg Hx      Physical Exam: Vitals:   12/24/18 1035 12/24/18 1100 12/24/18 1132 12/24/18 1702  BP: (!) 129/93 (!) 141/93 (!) 136/102 (!) 130/91  Pulse: 88 (!) 102  (!) 103 97  Resp:  20 17 17   Temp:   98.4 F (36.9 C) 98.6 F (37 C)  TempSrc:   Oral Oral  SpO2:  100% 100% 100%  Weight:      Height:        Constitutional: Alert and awake, oriented x3, not in any acute distress.  Lethargic and slow to answer some questions but not at all ill-appearing Eyes: PERLA, EOMI, irises appear normal, anicteric sclera,  ENMT: external ears and nose appear normal,   hearing is normal,     Lips appears normal, oropharynx mucosa, tongue, posterior pharynx appear normal  Neck: neck appears normal, no masses, normal ROM, no thyromegaly, no JVD  CVS: S1-S2 clear, no murmur rubs or gallops, no LE edema, normal pedal pulses  Respiratory:  clear to auscultation bilaterally, no wheezing, rales or rhonchi. Respiratory effort normal. No accessory muscle use.  Abdomen: soft nontender, nondistended, normal bowel sounds, no hepatosplenomegaly, no hernias  Musculoskeletal: : no cyanosis, clubbing or edema noted bilaterally; good tone no arthropathy bilaterally Neuro: Cranial nerves II-XII intact, strength, sensation, reflexes Psych: judgement and insight appear normal, stable mood and affect, mental status appears to be normal Skin: no rashes or lesions or ulcers, no induration or nodules, bur hole sites are healing well   Data reviewed:  I have personally reviewed following labs and imaging studies Labs:  CBC: Recent Labs  Lab 12/19/18 1355 12/19/18 2026 12/22/18 1529  WBC 11.6* 10.2 9.4  NEUTROABS 9.2*  --  8.3*  HGB 10.9* 10.9* 11.2*  HCT 34.1* 31.6* 32.3*  MCV 91.4 86.3 85.4  PLT 417* 380 0000000    Basic Metabolic Panel: Recent Labs  Lab 12/19/18 1355 12/22/18 1447  NA 138 132*  K 3.3* 4.1  CL 100 94*  CO2 17* 26  GLUCOSE 112* 80  BUN 6 <5*  CREATININE 0.78 0.49*  CALCIUM 8.1* 8.4*  MG 2.1  --    GFR Estimated Creatinine Clearance: 132 mL/min (A) (by C-G formula based on SCr of 0.49 mg/dL (L)).  CBG: Recent Labs  Lab 12/23/18 2307 12/24/18  0318 12/24/18 0736 12/24/18 1137 12/24/18 1625  GLUCAP 240* 138* 89 282* 122*   Urinalysis    Component Value Date/Time   COLORURINE YELLOW 09/11/2018 1750   APPEARANCEUR CLEAR 09/11/2018 1750   LABSPEC 1.025 09/11/2018 1750   PHURINE 5.5 09/11/2018 1750   GLUCOSEU >=500 (A) 09/11/2018 1750   HGBUR NEGATIVE 09/11/2018 1750   BILIRUBINUR NEGATIVE 09/11/2018 1750   KETONESUR NEGATIVE 09/11/2018 1750   PROTEINUR NEGATIVE 09/11/2018 1750   UROBILINOGEN 0.2 11/17/2014 1315   NITRITE NEGATIVE 09/11/2018 [place] 09/11/2018 1750     Microbiology Recent Results (from the past 240 hour(s))  SARS Coronavirus 2 [facility] order, Performed in [facility] hospital lab) Nasopharyngeal  Nasopharyngeal Swab     Status: None   Collection Time: 12/19/18  3:28 PM   Specimen: Nasopharyngeal Swab  Result Value Ref Range Status   SARS Coronavirus 2 NEGATIVE NEGATIVE Final    Comment: (NOTE) If result is NEGATIVE SARS-CoV-2 target nucleic acids are NOT DETECTED. The SARS-CoV-2 RNA is generally detectable in upper and lower  respiratory specimens during the acute phase of infection. The lowest  concentration of SARS-CoV-2 viral copies this assay can detect is 250  copies / mL. A negative result does not preclude SARS-CoV-2 infection  and should not be used as the sole basis for treatment or other  patient management decisions.  A negative result may occur with  improper specimen collection / handling, submission of specimen other  than nasopharyngeal swab, presence of viral mutation(s) within the  areas targeted by this assay, and inadequate number of viral copies  (<250 copies / mL). A negative result must be combined with clinical  observations, patient history, and epidemiological information. If result is POSITIVE SARS-CoV-2 target nucleic acids are DETECTED. The SARS-CoV-2 RNA is generally detectable in upper and lower  respiratory specimens dur ing the acute phase of  infection.  Positive  results are indicative of active infection with SARS-CoV-2.  Clinical  correlation with patient history and other diagnostic information is  necessary to determine patient infection status.  Positive results do  not rule out bacterial infection or co-infection with other viruses. If result is PRESUMPTIVE POSTIVE SARS-CoV-2 nucleic acids MAY BE PRESENT.   A presumptive positive result was obtained on the submitted specimen  and confirmed on repeat testing.  While 2019 novel coronavirus  (SARS-CoV-2) nucleic acids may be present in the submitted sample  additional confirmatory testing may be necessary for epidemiological  and / or clinical management purposes  to differentiate between  SARS-CoV-2 and other Sarbecovirus currently known to infect humans.  If clinically indicated additional testing with an alternate test  methodology [phone]) is advised. The SARS-CoV-2 RNA is generally  detectable in upper and lower respiratory sp ecimens during the acute  phase of infection. The expected result is Negative. Fact Sheet for Patients:  StrictlyIdeas.no Fact Sheet for Healthcare Providers: BankingDealers.co.za This test is not yet approved or cleared by the [place] FDA and has been authorized for detection and/or diagnosis of SARS-CoV-2 by FDA under an Emergency Use Authorization (EUA).  This EUA will remain in effect (meaning this test can be used) for the duration of the COVID-19 declaration under Section 564(b)(1) of the Act, 21 U.S.C. section 360bbb-3(b)(1), unless the authorization is terminated or revoked sooner. Performed at [facility], [address] [place], [place] [address]   MRSA PCR Screening     Status: None   Collection Time: 12/19/18  6:50 PM   Specimen: Nasal Mucosa; Nasopharyngeal  Result Value Ref Range Status   MRSA by PCR NEGATIVE NEGATIVE Final    Comment:        The GeneXpert  MRSA Assay (FDA approved for NASAL specimens only), is one component of a comprehensive MRSA colonization surveillance program. It is not intended to diagnose MRSA infection nor to guide or monitor treatment for MRSA infections. Performed at [facility], [place] [address], [place], [place] [address]        Inpatient Medications:   Scheduled Meds: . amLODipine  10 mg Oral Daily  . Chlorhexidine Gluconate Cloth  6 each Topical Daily  . cloNIDine  0.1 mg Oral TID  .  docusate sodium  100 mg Oral BID  . escitalopram  20 mg Oral Daily  . feeding supplement (GLUCERNA SHAKE)  237 mL Oral TID BM  . folic acid  1 mg Oral Daily  . insulin glargine  15 Units Subcutaneous QHS  .  morphine injection  2 mg Intravenous Once  . ondansetron (ZOFRAN) IV  4 mg Intravenous Once  . pantoprazole (PROTONIX) IV  40 mg Intravenous QHS  . QUEtiapine  300 mg Oral QHS  . thiamine  100 mg Oral Daily   Continuous Infusions: . 0.9 % NaCl with KCl 20 mEq / L 75 mL/hr at 12/24/18 1100  . cefTRIAXone (ROCEPHIN)  IV Stopped (12/24/18 0805)  . dexmedetomidine (PRECEDEX) IV infusion Stopped (12/22/18 1131)  . levETIRAcetam 500 mg (12/24/18 1620)     Radiological Exams on Admission: No results found.  Impression/Recommendations Active Problems:   Diabetes type 2, uncontrolled (HCC)   Essential hypertension   SDH (subdural hematoma) (HCC)   1.  Diabetes type 2 uncontrolled: I believe the patient would be better served with carbohydrate appropriate mealtime insulin.  Going to start him on a low dose of 3 units of short acting insulin prior to meals.  Will prevent [place] from having to catch up when he has a high glucose after a meal.  I believe he is very sensitive to regular insulin is probably not a good choice for him.  I am going to continue Lantus at 10 units I believe that he will still be hyperglycemic with this and perhaps tomorrow if he is stable and not had any episodes of hypoglycemia we could  consider increasing his Lantus to 15 or 20 units based on what his blood glucoses are running.  2.  Essential hypertension: I do believe that the patient has not been drinking since his last admission to the hospital.  He has refused all the beer that has been offered him.  While he has had episodes of behavior I do not think it is related to alcohol withdrawal.  He is in a lot of pain in his head is hurting him and his blood pressures may be elevated due to that.  I am going to add clonidine 0.1 mg p.o. 3 times daily to cover his blood pressures.  This should also proves to have some calming autonomic effects.  Will monitor blood pressures and adjust medication as needed.  3.  Subdural hematoma: Management per neurosurgery.  Thank you for this consultation.  Our [facility] hospitalist team will follow the patient with you.   Time Spent: 50 minutes  Lady Deutscher M.D. [facility] 12/24/2018, 5:52 PM

## 2018-12-25 DIAGNOSIS — S065X9A Traumatic subdural hemorrhage with loss of consciousness of unspecified duration, initial encounter: Secondary | ICD-10-CM

## 2018-12-25 DIAGNOSIS — R51 Headache: Secondary | ICD-10-CM

## 2018-12-25 LAB — GLUCOSE, CAPILLARY
Glucose-Capillary: 106 mg/dL — ABNORMAL HIGH (ref 70–99)
Glucose-Capillary: 168 mg/dL — ABNORMAL HIGH (ref 70–99)
Glucose-Capillary: 146 mg/dL — ABNORMAL HIGH (ref 70–99)
Glucose-Capillary: 140 mg/dL — ABNORMAL HIGH (ref 70–99)

## 2018-12-25 MED ORDER — INSULIN ASPART 100 UNIT/ML ~~LOC~~ SOLN
0.0000 [IU] | Freq: Three times a day (TID) | SUBCUTANEOUS | Status: DC
  Administered 2018-12-26 – 2018-12-27 (×3): 1 [IU] via SUBCUTANEOUS

## 2018-12-25 MED ORDER — INSULIN ASPART 100 UNIT/ML ~~LOC~~ SOLN
4.0000 [IU] | Freq: Three times a day (TID) | SUBCUTANEOUS | Status: DC
  Administered 2018-12-25 – 2018-12-27 (×5): 4 [IU] via SUBCUTANEOUS

## 2018-12-25 MED ORDER — PANTOPRAZOLE SODIUM 40 MG PO TBEC
40.0000 mg | DELAYED_RELEASE_TABLET | Freq: Every day | ORAL | Status: DC
  Administered 2018-12-25 – 2018-12-26 (×2): 40 mg via ORAL
  Filled 2018-12-25 (×2): qty 1

## 2018-12-25 NOTE — Progress Notes (Signed)
Subjective: Patient reports some headaches but tolerable.   Objective: Vital signs in last 24 hours: Temp:  [98.4 F (36.9 C)-98.8 F (37.1 C)] 98.8 F (37.1 C) (08/28 0800) Pulse Rate:  [88-111] 90 (08/28 0600) Resp:  [11-20] 13 (08/28 0200) BP: (114-149)/(79-102) 115/82 (08/28 0800) SpO2:  [96 %-100 %] 96 % (08/28 0415)  Intake/Output from previous day: 08/27 0701 - 08/28 0700 In: 2042.3 [P.O.:1160; I.V.:682.5; IV Piggyback:199.9] Out: 2900 [Urine:2900] Intake/Output this shift: No intake/output data recorded.  Neurologic: Grossly normal  Lab Results: Lab Results  Component Value Date   WBC 9.4 12/22/2018   HGB 11.2 (L) 12/22/2018   HCT 32.3 (L) 12/22/2018   MCV 85.4 12/22/2018   PLT 318 12/22/2018   Lab Results  Component Value Date   INR 1.0 11/22/2018   BMET Lab Results  Component Value Date   NA 132 (L) 12/22/2018   K 4.1 12/22/2018   CL 94 (L) 12/22/2018   CO2 26 12/22/2018   GLUCOSE 80 12/22/2018   BUN <5 (L) 12/22/2018   CREATININE 0.49 (L) 12/22/2018   CALCIUM 8.4 (L) 12/22/2018    Studies/Results: No results found.  Assessment/Plan: Status post subdural drain for reaccumulation of SDH. Doing well from nsgy perspective. Continue therapies   LOS: 6 days    Willie [name] [name] 12/25/2018, 9:56 AM

## 2018-12-25 NOTE — Progress Notes (Signed)
Nutrition Follow-up  DOCUMENTATION CODES:   Not applicable  INTERVENTION:  Continue Glucerna Shake po TID, each supplement provides 220 kcal and 10 grams of protein  Encourage adequate PO intake.   NUTRITION DIAGNOSIS:   Inadequate oral intake related to lethargy/confusion as evidenced by meal completion < 50%; improved  GOAL:   Patient will meet greater than or equal to 90% of their needs; met  MONITOR:   PO intake, Supplement acceptance  REASON FOR ASSESSMENT:   Malnutrition Screening Tool    ASSESSMENT:   Pt with PMH of alcohol abuse, recurrent alcoholic pancreatitis, DM, depression, HTN who one month ago had bilateral craniotomies for SDH and now admitted with recurrent chronic SDH.  Procedure (8/22): craniectomy and placement of subdural drain   Meal completion has been 75-100%. Nutritional supplements have been modified to Glucerna shakes to help minimize blood glucose response. RD to continue with current orders to aid in caloric and protein needs.   Labs and medications reviewed.   Diet Order:   Diet Order            Diet Heart Room service appropriate? Yes with Assist; Fluid consistency: Thin  Diet effective now              EDUCATION NEEDS:   No education needs have been identified at this time  Skin:  Skin Assessment: Reviewed RN Assessment  Last BM:  8/26  Height:   Ht Readings from Last 1 Encounters:  12/19/18 6' (1.829 m)    Weight:   Wt Readings from Last 1 Encounters:  12/19/18 79.6 kg    Ideal Body Weight:  80.9 kg  BMI:  Body mass index is 23.8 kg/m.  Estimated Nutritional Needs:   Kcal:  2000-2200  Protein:  100-110 grams  Fluid:  > 2L /day    Willie Parker, MS, RD, LDN Pager # [phone] After hours/ weekend pager # [phone]

## 2018-12-25 NOTE — Progress Notes (Signed)
PROGRESS NOTE  Willie [name] [id number] DOB: 1975/12/17   PCP: Debbrah Alar, NP  Patient is from: Home  DOA: 12/19/2018 LOS: 6  Brief Narrative / Interim history: 43 year old male with subdural hematoma, DM-2, HTN, OSA, alcohol use disorder, alcoholic pancreatitis, depression and chronic pain admitted by neurosurgery with subdural hematoma.  Underwent ventricular drain placement.   On 12/24/2018, hospitalist service was consulted for management of his diabetes/fluctuating CBG and hypertension.    Subjective: No major events overnight of this morning.  Continues to complain headache mainly on left side that he describes as throbbing.  Pain resolved with pain medication.  He denies nausea, vomiting, photophobia or phonophobia.  Very poor sleep hygiene.  Denies new focal neuro symptoms, chest pain, dyspnea, GI or GU symptoms.  His CBG has been within fair range since adjustment to his insulin.  Blood pressure improved as well.  Objective: Vitals:   12/25/18 0415 12/25/18 0600 12/25/18 0800 12/25/18 1206  BP: (!) 123/92 133/89 115/82 129/87  Pulse: 88 90    Resp:    14  Temp:   98.8 F (37.1 C) 98.9 F (37.2 C)  TempSrc:   Oral Oral  SpO2: 96%     Weight:      Height:        Intake/Output Summary (Last 24 hours) at 12/25/2018 1513 Last data filed at 12/25/2018 1459 Gross per 24 hour  Intake 3282.16 ml  Output 2925 ml  Net 357.16 ml   Filed Weights   12/19/18 1820  Weight: 79.6 kg    Examination:  GENERAL: No acute distress.  Appears well.  HEENT: MMM.  Vision and hearing grossly intact.  NECK: Supple.  No apparent JVD.  RESP:  No IWOB. Good air movement bilaterally. CVS:  RRR. Heart sounds normal.  ABD/GI/GU: Bowel sounds present. Soft. Non tender.  MSK/EXT:  Moves extremities. No apparent deformity or edema.  SKIN: Surgical wound over his head clean and healing. NEURO: Awake, alert and oriented appropriately.  No gross deficit.  PSYCH: Calm. Normal  affect.   Assessment & Plan: Controlled DM-2 with hyper and hypoglycemia: A1c 6.9% on 11/23/2018. CBG (last 3)  Recent Labs    12/24/18 2103 12/25/18 0812 12/25/18 1205  GLUCAP 227* 106* 168*  -Continue Lantus 10 units at bedtime -Increase NovoLog to 4 units with meals -We will add very thin SSI. -Start statin.  Hypertension: Normotensive now. -Continue amlodipine, clonidine and PRN labetalol. -Recommend discontinuing IV fluid unless needed.  Headache: Could be due to surgery and a SDH.  He also have poor sleep hygiene.  Rebound headache is a possibility. -Discussed the importance of sleep hygiene and minimizing pain medication use.  Recurrent subdural hematoma -Per neurosurgery.  ID: On ceftriaxone per primary.  Indication? -Recommend discontinuing unless there is indication.  History of depression/anxiety/mood disorder: Stable. -On Seroquel and Lexapro  DVT prophylaxis: On SCD Code Status: Full code Family Communication: Patient and/or RN. Available if any question.  Disposition Plan: Per primary team Consultants: We are  Procedures:  11/24/2018-bilateral craniotomy for evacuation of subdural hematoma, and drain placement. 12/19/2018-bedside twist drill craniotomy and placement of subdural drain  Microbiology summarized: COVID-19 negative. MRSA PCR negative.  Antimicrobials: Anti-infectives (From admission, onward)   Start     Dose/Rate Route Frequency Ordered Stop   12/19/18 2100  ceFAZolin (ANCEF) IVPB 2g/100 mL premix     2 g 200 mL/hr over 30 Minutes Intravenous Every 8 hours 12/19/18 1954 12/20/18 0448   12/19/18 2000  cefTRIAXone (ROCEPHIN)  1 g in sodium chloride 0.9 % 100 mL IVPB     1 g 200 mL/hr over 30 Minutes Intravenous Every 12 hours 12/19/18 1913        Sch Meds:  Scheduled Meds: . amLODipine  10 mg Oral Daily  . Chlorhexidine Gluconate Cloth  6 each Topical Daily  . cloNIDine  0.1 mg Oral TID  . docusate sodium  100 mg Oral BID  .  escitalopram  20 mg Oral Daily  . feeding supplement (GLUCERNA SHAKE)  237 mL Oral TID BM  . folic acid  1 mg Oral Daily  . insulin aspart  0-3 Units Subcutaneous TID WC  . insulin aspart  4 Units Subcutaneous TID WC  . insulin glargine  10 Units Subcutaneous QHS  .  morphine injection  2 mg Intravenous Once  . pantoprazole  40 mg Oral QHS  . QUEtiapine  300 mg Oral QHS  . thiamine  100 mg Oral Daily   Continuous Infusions: . 0.9 % NaCl with KCl 20 mEq / L 75 mL/hr at 12/25/18 1400  . cefTRIAXone (ROCEPHIN)  IV Stopped (12/25/18 1036)  . dexmedetomidine (PRECEDEX) IV infusion 3.769 mcg/kg/hr (12/25/18 0422)  . levETIRAcetam Stopped (12/25/18 0422)   PRN Meds:.HYDROcodone-acetaminophen, HYDROmorphone (DILAUDID) injection, labetalol, LORazepam, LORazepam, LORazepam, midazolam, oxyCODONE, promethazine, promethazine   I have personally reviewed the following labs and images: CBC: Recent Labs  Lab 12/19/18 1355 12/19/18 2026 12/22/18 1529  WBC 11.6* 10.2 9.4  NEUTROABS 9.2*  --  8.3*  HGB 10.9* 10.9* 11.2*  HCT 34.1* 31.6* 32.3*  MCV 91.4 86.3 85.4  PLT 417* 380 318   BMP &GFR Recent Labs  Lab 12/19/18 1355 12/22/18 1447  NA 138 132*  K 3.3* 4.1  CL 100 94*  CO2 17* 26  GLUCOSE 112* 80  BUN 6 <5*  CREATININE 0.78 0.49*  CALCIUM 8.1* 8.4*  MG 2.1  --    Estimated Creatinine Clearance: 132 mL/min (A) (by C-G formula based on SCr of 0.49 mg/dL (L)). Liver & Pancreas: No results for input(s): AST, ALT, ALKPHOS, BILITOT, PROT, ALBUMIN in the last 168 hours. No results for input(s): LIPASE, AMYLASE in the last 168 hours. No results for input(s): AMMONIA in the last 168 hours. Diabetic: No results for input(s): HGBA1C in the last 72 hours. Recent Labs  Lab 12/24/18 1137 12/24/18 1625 12/24/18 2103 12/25/18 0812 12/25/18 1205  GLUCAP 282* 122* 227* 106* 168*   Cardiac Enzymes: No results for input(s): CKTOTAL, CKMB, CKMBINDEX, TROPONINI in the last 168 hours. No  results for input(s): PROBNP in the last 8760 hours. Coagulation Profile: No results for input(s): INR, PROTIME in the last 168 hours. Thyroid Function Tests: No results for input(s): TSH, T4TOTAL, FREET4, T3FREE, THYROIDAB in the last 72 hours. Lipid Profile: No results for input(s): CHOL, HDL, LDLCALC, TRIG, CHOLHDL, LDLDIRECT in the last 72 hours. Anemia Panel: No results for input(s): VITAMINB12, FOLATE, FERRITIN, TIBC, IRON, RETICCTPCT in the last 72 hours. Urine analysis:    Component Value Date/Time   COLORURINE YELLOW 09/11/2018 1750   APPEARANCEUR CLEAR 09/11/2018 1750   LABSPEC 1.025 09/11/2018 1750   PHURINE 5.5 09/11/2018 1750   GLUCOSEU >=500 (A) 09/11/2018 1750   HGBUR NEGATIVE 09/11/2018 1750   BILIRUBINUR NEGATIVE 09/11/2018 1750   KETONESUR NEGATIVE 09/11/2018 1750   PROTEINUR NEGATIVE 09/11/2018 1750   UROBILINOGEN 0.2 11/17/2014 1315   NITRITE NEGATIVE 09/11/2018 1750   LEUKOCYTESUR NEGATIVE 09/11/2018 1750   Sepsis Labs: Invalid input(s): PROCALCITONIN, LACTICIDVEN  Microbiology: Recent Results (from the past 240 hour(s))  SARS Coronavirus 2 [facility] order, Performed in [facility] hospital lab) Nasopharyngeal Nasopharyngeal Swab     Status: None   Collection Time: 12/19/18  3:28 PM   Specimen: Nasopharyngeal Swab  Result Value Ref Range Status   SARS Coronavirus 2 NEGATIVE NEGATIVE Final    Comment: (NOTE) If result is NEGATIVE SARS-CoV-2 target nucleic acids are NOT DETECTED. The SARS-CoV-2 RNA is generally detectable in upper and lower  respiratory specimens during the acute phase of infection. The lowest  concentration of SARS-CoV-2 viral copies this assay can detect is 250  copies / mL. A negative result does not preclude SARS-CoV-2 infection  and should not be used as the sole basis for treatment or other  patient management decisions.  A negative result may occur with  improper specimen collection / handling, submission of specimen other  than  nasopharyngeal swab, presence of viral mutation(s) within the  areas targeted by this assay, and inadequate number of viral copies  (<250 copies / mL). A negative result must be combined with clinical  observations, patient history, and epidemiological information. If result is POSITIVE SARS-CoV-2 target nucleic acids are DETECTED. The SARS-CoV-2 RNA is generally detectable in upper and lower  respiratory specimens dur ing the acute phase of infection.  Positive  results are indicative of active infection with SARS-CoV-2.  Clinical  correlation with patient history and other diagnostic information is  necessary to determine patient infection status.  Positive results do  not rule out bacterial infection or co-infection with other viruses. If result is PRESUMPTIVE POSTIVE SARS-CoV-2 nucleic acids MAY BE PRESENT.   A presumptive positive result was obtained on the submitted specimen  and confirmed on repeat testing.  While 2019 novel coronavirus  (SARS-CoV-2) nucleic acids may be present in the submitted sample  additional confirmatory testing may be necessary for epidemiological  and / or clinical management purposes  to differentiate between  SARS-CoV-2 and other Sarbecovirus currently known to infect humans.  If clinically indicated additional testing with an alternate test  methodology [id number]) is advised. The SARS-CoV-2 RNA is generally  detectable in upper and lower respiratory sp ecimens during the acute  phase of infection. The expected result is Negative. Fact Sheet for Patients:  StrictlyIdeas.no Fact Sheet for Healthcare Providers: BankingDealers.co.za This test is not yet approved or cleared by the [place] FDA and has been authorized for detection and/or diagnosis of SARS-CoV-2 by FDA under an Emergency Use Authorization (EUA).  This EUA will remain in effect (meaning this test can be used) for the duration of the  COVID-19 declaration under Section 564(b)(1) of the Act, 21 U.S.C. section 360bbb-3(b)(1), unless the authorization is terminated or revoked sooner. Performed at [facility], [address] [place], [place] [address]   MRSA PCR Screening     Status: None   Collection Time: 12/19/18  6:50 PM   Specimen: Nasal Mucosa; Nasopharyngeal  Result Value Ref Range Status   MRSA by PCR NEGATIVE NEGATIVE Final    Comment:        The GeneXpert MRSA Assay (FDA approved for NASAL specimens only), is one component of a comprehensive MRSA colonization surveillance program. It is not intended to diagnose MRSA infection nor to guide or monitor treatment for MRSA infections. Performed at [facility], [place] [address], [place], [place] [address]     Radiology Studies: No results found.   Hanif Radin T. [place]  If  7PM-7AM, please contact night-coverage [email or web address] Password [email or web address] 12/25/2018, 3:13 PM

## 2018-12-25 NOTE — Progress Notes (Signed)
Physical Therapy Treatment Patient Details Name: Willie [name] MRN: [id number] DOB: 1975/12/03 Today's Date: 12/25/2018    History of Present Illness Pt is a 43 y.o. M with significant PMH of hypertension, type 2 diabetes mellitus, depression, anxiety, history of alcohol abuse. He is 1 month out from bilateral craniotomies for subdural with a recurrent large chronic on the left. 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 S/p placement of subdural drain, which pt removed yesterday, 8/26.     PT Comments    Pt progressing well towards physical therapy goals, ambulating 550 feet with no assistive device at a supervision level, HR peak 113 bpm. Session focused on dual tasking with cognitive tasks during mobility I.e. counting, naming, problem solving. 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 confusing pulse oximetry for spoon. Recommended formal SLP cognitive assessment.    Follow Up Recommendations  No PT follow up     Equipment Recommendations  None recommended by PT    Recommendations for Other Services       Precautions / Restrictions Precautions Precautions: None Restrictions Weight Bearing Restrictions: No    Mobility  Bed Mobility Overal bed mobility: Independent                Transfers Overall transfer level: Independent Equipment used: None                Ambulation/Gait Ambulation/Gait assistance: Supervision Gait Distance (Feet): 550 Feet Assistive device: None Gait Pattern/deviations: WFL(Within Functional Limits) Gait velocity: decreased   General Gait Details: Slower speed for age, no overt LOB.    Stairs             Wheelchair Mobility    Modified Rankin (Stroke Patients Only) Modified Rankin (Stroke Patients Only) Pre-Morbid Rankin Score: No symptoms Modified Rankin: Moderately severe  disability     Balance Overall balance assessment: Mild deficits observed, not formally tested                                          Cognition Arousal/Alertness: Awake/alert Behavior During Therapy: WFL for tasks assessed/performed Overall Cognitive Status: Impaired/Different from baseline Area of Impairment: Problem solving                             Problem Solving: Slow processing General Comments: Pt A&Ox4, self reports apraxia issues i.e. confusing pulse oximetry for spoon. 1/3 short term recall, needs increased time for naming and counting tasks i.e. naming animals that start with letter "A."      Exercises      General Comments        Pertinent Vitals/Pain Pain Assessment: Faces Faces Pain Scale: Hurts a little bit Pain Location: mild headache Pain Descriptors / Indicators: Headache    Home Living                      Prior Function            PT Goals (current goals can now be found in the care plan section) Acute Rehab PT Goals Patient Stated Goal: none stated; agreeable to therapy PT Goal Formulation: With patient Time For Goal Achievement: 01/06/19 Potential to Achieve Goals: Good Progress towards PT goals:  Progressing toward goals    Frequency    Min 3X/week      PT Plan Current plan remains appropriate    Co-evaluation              AM-PAC PT "6 Clicks" Mobility   Outcome Measure  Help needed turning from your back to your side while in a flat bed without using bedrails?: None Help needed moving from lying on your back to sitting on the side of a flat bed without using bedrails?: None Help needed moving to and from a bed to a chair (including a wheelchair)?: None Help needed standing up from a chair using your arms (e.g., wheelchair or bedside chair)?: None Help needed to walk in hospital room?: None Help needed climbing 3-5 steps with a railing? : A Little 6 Click Score: 23    End of  Session   Activity Tolerance: Patient tolerated treatment well Patient left: in bed;with call bell/phone within reach Nurse Communication: Mobility status PT Visit Diagnosis: Unsteadiness on feet (R26.81)     Time: [id number] PT Time Calculation (min) (ACUTE ONLY): 18 min  Charges:  $Therapeutic Activity: 8-22 mins                     Willie [name], PT, DPT Acute Rehabilitation Services Pager [phone] Office [phone]    Willie [name] 12/25/2018, 4:26 PM

## 2018-12-25 NOTE — Evaluation (Addendum)
Speech Language Pathology Evaluation Patient Details Name: Willie [name] MRN: [id number] DOB: Oct 24, 1975 Today's Date: 12/25/2018 Time: [id number] SLP Time Calculation (min) (ACUTE ONLY): 14 min  Problem List:  Patient Active Problem List   Diagnosis Date Noted  . Subdural bleeding ([place]) 11/24/2018  . Abdominal pain 11/22/2018  . SDH (subdural hematoma) ([place]) 11/22/2018  . Abnormal LFTs 11/22/2018  . IDDM (insulin dependent diabetes mellitus) ([place]) 09/12/2018  . Acute hepatitis 09/11/2018  . Alcohol-induced mood disorder ([place])   . DKA (diabetic ketoacidoses) ([place]) 07/28/2017  . Alcohol withdrawal ([place]) 07/28/2017  . Abnormal CT scan, colon   . Benign neoplasm of descending colon   . Lower GI bleed 07/04/2017  . Transaminitis 07/04/2017  . Pain   . Bleeding internal hemorrhoids   . Anemia of chronic disease   . Left arm weakness   . Anemia, blood loss   . Vomiting alone 03/27/2017  . Chest pain 03/27/2017  . Nausea and vomiting   . Lactic acidosis 03/26/2017  . History of alcohol abuse 06/25/2016  . Tobacco abuse 06/25/2016  . Anxiety state 11/20/2015  . Dental infection 07/10/2015  . Mood disorder ([place]) 04/18/2015  . Eczema 06/27/2014  . GERD (gastroesophageal reflux disease) 05/24/2014  . Snoring 04/07/2013  . Suicide attempt by substance overdose ([place]) 02/10/2013  . Alcohol abuse 12/31/2012  . Erectile dysfunction 12/09/2012  . Hematuria 06/11/2012  . Vision problem 02/02/2012  . Insomnia 12/20/2011  . Chronic pancreatitis ([place]) 02/05/2011  . Depression with anxiety 11/13/2010  . Hyperlipemia 06/27/2010  . Diabetes type 2, uncontrolled ([place]) 05/22/2010  . Essential hypertension 05/22/2010   Past Medical History:  Past Medical History:  Diagnosis Date  . Alcohol abuse   . Alcoholic pancreatitis    recurrent  . Depression   . Diabetes mellitus, type II ([place])    [place] 03/2010  . GERD (gastroesophageal reflux disease)   . High cholesterol   . History  of low back pain    with herniated disc L5 S1 with right lumbar radiculopathy  . Hypertension   . Sleep apnea    does not wear a CPAP   Past Surgical History:  Past Surgical History:  Procedure Laterality Date  . COLONOSCOPY W/ BIOPSIES  09/2010   Dr. Ardis Hughs.  for intermittent rectal bleeding.  Mild sigmoid to descending diverticulosis.  Mild left colon erythema, benign biopsy, probably "prep effect"  . COLONOSCOPY WITH PROPOFOL N/A 07/06/2017   Procedure: COLONOSCOPY WITH PROPOFOL;  Surgeon: Jerene Bears, MD;  Location: [facility];  Service: Gastroenterology;  Laterality: N/A;  . CRANIOTOMY Bilateral 11/24/2018   Procedure: Bilateral craniotomy for evacuation of subdural hematoma;  Surgeon: Erline Levine, MD;  Location: [facility];  Service: Neurosurgery;  Laterality: Bilateral;  Bilateral craniotomy for evacuation of subdural hematoma  . LUMBAR MICRODISCECTOMY  ~ 2004   Dr Ellene Route   HPI:  Pt is a 43 y.o. M with significant PMH of hypertension, type 2 diabetes mellitus, depression, anxiety, history of alcohol abuse. He is 1 month out from bilateral craniotomies for subdural with a recurrent large chronic on the left. 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 S/p placement of subdural drain, which pt removed yesterday, 8/26.    Assessment / Plan / Recommendation Clinical Impression  This pt is known to this Probation officer during admission in Feb. 2019. Pt is oriented x 4 with Mod I, was conversing with significant  other via cellphone with no word finding difficulty. He demonstrates appropraite selective attention in moderately distracting environment and his awareness of situation fluctuates with medication administration per pt and nursing support. Pt is able to recall pulling out drain and states that he shouldn't have done that now that he "can think clearly." Pt with mildy vague description of pulse ox monitor on  finger but didn't demonstrate any further word finding deficits. Nursing has not observed any. It is possible that pt does have mild higher level cognitive deficits but no acute immediate needs identified. Pt's speech was intelligible at the conversation level with no evidence of slurring noted. Should pt desire to pursue higher level cognitive treatment, he can do so in next venue of care. ST to sign off during acute stay    SLP Assessment  SLP Recommendation/Assessment: All further Speech Lanaguage Pathology  needs can be addressed in the next venue of care SLP Visit Diagnosis: Cognitive communication deficit (R41.841)    Follow Up Recommendations  None    Frequency and Duration           SLP Evaluation Cognition  Overall Cognitive Status: History of cognitive impairments - at baseline Arousal/Alertness: Awake/alert Orientation Level: Oriented X4 Attention: Selective Memory: Appears intact Awareness: (fluctuates depending on medication) Problem Solving: Appears intact Safety/Judgment: (fluctuates depending on medications)       Comprehension  Auditory Comprehension Overall Auditory Comprehension: Appears within functional limits for tasks assessed Visual Recognition/Discrimination Discrimination: Within Function Limits Reading Comprehension Reading Status: Not tested    Expression Expression Primary Mode of Expression: Verbal Verbal Expression Overall Verbal Expression: Appears within functional limits for tasks assessed Initiation: No impairment Automatic Speech: Name;Social Response;Day of week;Month of year Level of Generative/Spontaneous Verbalization: Conversation Repetition: No impairment Naming: No impairment Pragmatics: No impairment Non-Verbal Means of Communication: Not applicable Written Expression Dominant Hand: Right Written Expression: Not tested   Oral / Motor  Oral Motor/Sensory Function Overall Oral Motor/Sensory Function: Within functional  limits Motor Speech Overall Motor Speech: Appears within functional limits for tasks assessed Respiration: Within functional limits Phonation: Normal Resonance: Within functional limits Articulation: Within functional limitis Intelligibility: Intelligible Motor Planning: Witnin functional limits Motor Speech Errors: Not applicable   GO                    Celester Lech 12/25/2018, 5:53 PM

## 2018-12-25 NOTE — Plan of Care (Signed)

## 2018-12-26 LAB — GLUCOSE, CAPILLARY
Glucose-Capillary: 103 mg/dL — ABNORMAL HIGH (ref 70–99)
Glucose-Capillary: 188 mg/dL — ABNORMAL HIGH (ref 70–99)
Glucose-Capillary: 180 mg/dL — ABNORMAL HIGH (ref 70–99)
Glucose-Capillary: 166 mg/dL — ABNORMAL HIGH (ref 70–99)

## 2018-12-26 NOTE — Plan of Care (Signed)

## 2018-12-26 NOTE — Progress Notes (Signed)
   Providing Compassionate, Quality Care - Together   Subjective: Patient reports subjective improvement of right-sided ataxia. Patient would like to discharge home.  Objective: Vital signs in last 24 hours: Temp:  [98.6 F (37 C)-99.3 F (37.4 C)] 99 F (37.2 C) (08/29 0745) Pulse Rate:  [77-100] 87 (08/29 1039) Resp:  [11-21] 21 (08/29 1039) BP: (110-147)/(72-96) 132/96 (08/29 1039) SpO2:  [96 %-100 %] 100 % (08/29 1039)  Intake/Output from previous day: 08/28 0701 - 08/29 0700 In: 5857.8 [P.O.:960; I.V.:2157.3; IV Piggyback:2740.5] Out: 2900 [Urine:2900] Intake/Output this shift: Total I/O In: 240 [P.O.:240] Out: 300 [Urine:300]  Alert and oriented x 4 PERRLA CN II-XII grossly intact MAE, Strength intact Slight right-sided drift   Studies/Results: No results found.  Assessment/Plan: Patient is seven days status post subdural drain placement for recurrent chronic subdural. CT scan 12/22/2018 showed improvement. Patient pulled his subdural drain on 12/22/2018. Patient weaned off of Precedex for withdrawal.  Blood glucose appears better controlled and without rapid swings.   LOS: 7 days    -Continue to mobilize with therapies   Viona Gilmore, DNP, AGNP-C Nurse Practitioner  [facility] [place] [address] [place], [place], [place] [address] P: [phone]    F: [phone]  12/26/2018, 11:15 AM

## 2018-12-26 NOTE — Progress Notes (Signed)
PROGRESS NOTE  Willie [name] [id number] DOB: [birth year]   PCP: Debbrah Alar, NP  Patient is from: Home  DOA: 12/19/2018 LOS: 7  Brief Narrative / Interim history: 43 year old male with subdural hematoma, DM-2, HTN, OSA, alcohol use disorder, alcoholic pancreatitis, depression and chronic pain admitted by neurosurgery with subdural hematoma.  Underwent ventricular drain placement.   On 12/24/2018, hospitalist service was consulted for management of his diabetes/fluctuating CBG and hypertension.    Subjective: No major events overnight of this morning.  Headache improved.  Blood glucose and blood pressure fairly controlled.  He is asking about when he can go home.  Denies chest pain, dyspnea, GI or GU symptoms.  Objective: Vitals:   12/26/18 0645 12/26/18 0745 12/26/18 1039 12/26/18 1206  BP:  (!) 130/93 (!) 132/96 129/85  Pulse: 82 100 87 82  Resp:  12 (!) 21 19  Temp:  99 F (37.2 C)  98.7 F (37.1 C)  TempSrc:  Oral  Oral  SpO2: 100% 96% 100% 99%  Weight:      Height:        Intake/Output Summary (Last 24 hours) at 12/26/2018 1445 Last data filed at 12/26/2018 0915 Gross per 24 hour  Intake 4135.5 ml  Output 2900 ml  Net 1235.5 ml   Filed Weights   12/19/18 1820  Weight: 79.6 kg    Examination:  GENERAL: No acute distress.  Appears well.  HEENT: MMM.  Vision and hearing grossly intact.  NECK: Supple.  No apparent JVD.  RESP:  No IWOB. Good air movement bilaterally. CVS:  RRR. Heart sounds normal.  ABD/GI/GU: Bowel sounds present. Soft. Non tender.  MSK/EXT:  Moves extremities. No apparent deformity or edema.  SKIN: Surgical wound dry, clean and healing NEURO: Awake, alert and oriented appropriately.  No gross deficit.  PSYCH: Calm. Normal affect.   Assessment & Plan: Controlled DM-2 with hyper and hypoglycemia: A1c 6.9% on 11/23/2018.  CBG fairly controlled. -Continue NovoLog to 4 units with meals -Continue modified SSI -Continue statin   Hypertension: Normotensive now. -Continue amlodipine, clonidine and PRN labetalol. -Recommend discontinuing IV fluid unless needed.  Headache: SDH?  Poor sleep?  Rebound headache?.  -Emphasized sleep hygiene -Minimize medication use  Recurrent subdural hematoma -Per neurosurgery.  ID: On ceftriaxone per primary.  I do not see indication. -Recommend discontinuing  History of depression/anxiety/mood disorder: Stable. -On Seroquel and Lexapro  DVT prophylaxis: On SCD Code Status: Full code Family Communication: Patient and/or RN. Available if any question.  Disposition Plan: Per primary team Consultants: We are  Procedures:  11/24/2018-bilateral craniotomy for evacuation of subdural hematoma, and drain placement. 12/19/2018-bedside twist drill craniotomy and placement of subdural drain  Microbiology summarized: COVID-19 negative. MRSA PCR negative.  Antimicrobials: Anti-infectives (From admission, onward)   Start     Dose/Rate Route Frequency Ordered Stop   12/19/18 2100  ceFAZolin (ANCEF) IVPB 2g/100 mL premix     2 g 200 mL/hr over 30 Minutes Intravenous Every 8 hours 12/19/18 1954 12/20/18 0448   12/19/18 2000  cefTRIAXone (ROCEPHIN) 1 g in sodium chloride 0.9 % 100 mL IVPB     1 g 200 mL/hr over 30 Minutes Intravenous Every 12 hours 12/19/18 1913        Sch Meds:  Scheduled Meds: . amLODipine  10 mg Oral Daily  . Chlorhexidine Gluconate Cloth  6 each Topical Daily  . cloNIDine  0.1 mg Oral TID  . docusate sodium  100 mg Oral BID  . escitalopram  20 mg  Oral Daily  . feeding supplement (GLUCERNA SHAKE)  237 mL Oral TID BM  . folic acid  1 mg Oral Daily  . insulin aspart  0-3 Units Subcutaneous TID WC  . insulin aspart  4 Units Subcutaneous TID WC  . insulin glargine  10 Units Subcutaneous QHS  .  morphine injection  2 mg Intravenous Once  . pantoprazole  40 mg Oral QHS  . QUEtiapine  300 mg Oral QHS  . thiamine  100 mg Oral Daily   Continuous Infusions: .  0.9 % NaCl with KCl 20 mEq / L 75 mL/hr at 12/26/18 [id number]  . cefTRIAXone (ROCEPHIN)  IV 1 g (12/26/18 0821)  . dexmedetomidine (PRECEDEX) IV infusion 3.769 mcg/kg/hr (12/25/18 0422)  . levETIRAcetam 500 mg (12/26/18 0334)   PRN Meds:.HYDROcodone-acetaminophen, HYDROmorphone (DILAUDID) injection, labetalol, LORazepam, LORazepam, LORazepam, midazolam, oxyCODONE, promethazine, promethazine   I have personally reviewed the following labs and images: CBC: Recent Labs  Lab 12/19/18 2026 12/22/18 1529  WBC 10.2 9.4  NEUTROABS  --  8.3*  HGB 10.9* 11.2*  HCT 31.6* 32.3*  MCV 86.3 85.4  PLT 380 318   BMP &GFR Recent Labs  Lab 12/22/18 1447  NA 132*  K 4.1  CL 94*  CO2 26  GLUCOSE 80  BUN <5*  CREATININE 0.49*  CALCIUM 8.4*   Estimated Creatinine Clearance: 132 mL/min (A) (by C-G formula based on SCr of 0.49 mg/dL (L)). Liver & Pancreas: No results for input(s): AST, ALT, ALKPHOS, BILITOT, PROT, ALBUMIN in the last 168 hours. No results for input(s): LIPASE, AMYLASE in the last 168 hours. No results for input(s): AMMONIA in the last 168 hours. Diabetic: No results for input(s): HGBA1C in the last 72 hours. Recent Labs  Lab 12/25/18 1205 12/25/18 1625 12/25/18 2115 12/26/18 0750 12/26/18 1205  GLUCAP 168* 146* 140* 103* 188*   Cardiac Enzymes: No results for input(s): CKTOTAL, CKMB, CKMBINDEX, TROPONINI in the last 168 hours. No results for input(s): PROBNP in the last 8760 hours. Coagulation Profile: No results for input(s): INR, PROTIME in the last 168 hours. Thyroid Function Tests: No results for input(s): TSH, T4TOTAL, FREET4, T3FREE, THYROIDAB in the last 72 hours. Lipid Profile: No results for input(s): CHOL, HDL, LDLCALC, TRIG, CHOLHDL, LDLDIRECT in the last 72 hours. Anemia Panel: No results for input(s): VITAMINB12, FOLATE, FERRITIN, TIBC, IRON, RETICCTPCT in the last 72 hours. Urine analysis:    Component Value Date/Time   COLORURINE YELLOW 09/11/2018  1750   APPEARANCEUR CLEAR 09/11/2018 1750   LABSPEC 1.025 09/11/2018 1750   PHURINE 5.5 09/11/2018 1750   GLUCOSEU >=500 (A) 09/11/2018 1750   HGBUR NEGATIVE 09/11/2018 1750   BILIRUBINUR NEGATIVE 09/11/2018 1750   KETONESUR NEGATIVE 09/11/2018 1750   PROTEINUR NEGATIVE 09/11/2018 1750   UROBILINOGEN 0.2 11/17/2014 1315   NITRITE NEGATIVE 09/11/2018 1750   LEUKOCYTESUR NEGATIVE 09/11/2018 1750   Sepsis Labs: Invalid input(s): PROCALCITONIN, [place]  Microbiology: Recent Results (from the past 240 hour(s))  SARS Coronavirus 2 [facility] order, Performed in [facility] hospital lab) Nasopharyngeal Nasopharyngeal Swab     Status: None   Collection Time: 12/19/18  3:28 PM   Specimen: Nasopharyngeal Swab  Result Value Ref Range Status   SARS Coronavirus 2 NEGATIVE NEGATIVE Final    Comment: (NOTE) If result is NEGATIVE SARS-CoV-2 target nucleic acids are NOT DETECTED. The SARS-CoV-2 RNA is generally detectable in upper and lower  respiratory specimens during the acute phase of infection. The lowest  concentration of SARS-CoV-2 viral copies this assay  can detect is 250  copies / mL. A negative result does not preclude SARS-CoV-2 infection  and should not be used as the sole basis for treatment or other  patient management decisions.  A negative result may occur with  improper specimen collection / handling, submission of specimen other  than nasopharyngeal swab, presence of viral mutation(s) within the  areas targeted by this assay, and inadequate number of viral copies  (<250 copies / mL). A negative result must be combined with clinical  observations, patient history, and epidemiological information. If result is POSITIVE SARS-CoV-2 target nucleic acids are DETECTED. The SARS-CoV-2 RNA is generally detectable in upper and lower  respiratory specimens dur ing the acute phase of infection.  Positive  results are indicative of active infection with SARS-CoV-2.  Clinical   correlation with patient history and other diagnostic information is  necessary to determine patient infection status.  Positive results do  not rule out bacterial infection or co-infection with other viruses. If result is PRESUMPTIVE POSTIVE SARS-CoV-2 nucleic acids MAY BE PRESENT.   A presumptive positive result was obtained on the submitted specimen  and confirmed on repeat testing.  While 2019 novel coronavirus  (SARS-CoV-2) nucleic acids may be present in the submitted sample  additional confirmatory testing may be necessary for epidemiological  and / or clinical management purposes  to differentiate between  SARS-CoV-2 and other Sarbecovirus currently known to infect humans.  If clinically indicated additional testing with an alternate test  methodology [phone]) is advised. The SARS-CoV-2 RNA is generally  detectable in upper and lower respiratory sp ecimens during the acute  phase of infection. The expected result is Negative. Fact Sheet for Patients:  StrictlyIdeas.no Fact Sheet for Healthcare Providers: BankingDealers.co.za This test is not yet approved or cleared by the [place] FDA and has been authorized for detection and/or diagnosis of SARS-CoV-2 by FDA under an Emergency Use Authorization (EUA).  This EUA will remain in effect (meaning this test can be used) for the duration of the COVID-19 declaration under Section 564(b)(1) of the Act, 21 U.S.C. section 360bbb-3(b)(1), unless the authorization is terminated or revoked sooner. Performed at [facility], [address] [place], [place] [address]   MRSA PCR Screening     Status: None   Collection Time: 12/19/18  6:50 PM   Specimen: Nasal Mucosa; Nasopharyngeal  Result Value Ref Range Status   MRSA by PCR NEGATIVE NEGATIVE Final    Comment:        The GeneXpert MRSA Assay (FDA approved for NASAL specimens only), is one component of a comprehensive  MRSA colonization surveillance program. It is not intended to diagnose MRSA infection nor to guide or monitor treatment for MRSA infections. Performed at [facility], [place] [address], [place], [place] [address]     Radiology Studies: No results found.   Romanita Fager T. [place]  If 7PM-7AM, please contact night-coverage [email or web address] Password [email or web address] 12/26/2018, 2:45 PM

## 2018-12-27 LAB — GLUCOSE, CAPILLARY
Glucose-Capillary: 138 mg/dL — ABNORMAL HIGH (ref 70–99)
Glucose-Capillary: 182 mg/dL — ABNORMAL HIGH (ref 70–99)

## 2018-12-27 MED ORDER — TRAMADOL HCL 50 MG PO TABS: 50.0000 mg | ORAL_TABLET | Freq: Four times a day (QID) | ORAL | 0 refills | Status: DC | PRN

## 2018-12-27 MED ORDER — LEVETIRACETAM 500 MG PO TABS: 500.0000 mg | ORAL_TABLET | Freq: Two times a day (BID) | ORAL | 0 refills | Status: DC

## 2018-12-27 NOTE — Progress Notes (Signed)
Pt just got discharged; he was picked up by an Melburn Popper. He reviewed and he was taught about the medication that he is suppose to take. Vital signs stable upon discharged. Pt took his phone with him that was the only belonging he had beside his clothing.

## 2018-12-27 NOTE — Discharge Instructions (Signed)
Sutures will be removed at first post operative appointment. You can shower and wash your hair, but avoid scrubbing area of incision. No swimming or submerging incision in water until cleared by physician.

## 2018-12-27 NOTE — Progress Notes (Signed)
PROGRESS NOTE  Willie [name] [id number] DOB: 1976/03/16 DOA: 12/19/2018 PCP: Debbrah Alar, NP  HPI/Recap of past 30 hours: 43 year old male with subdural hematoma, DM-2, HTN, OSA, alcohol use disorder, alcoholic pancreatitis, depression and chronic pain admitted by neurosurgery with subdural hematoma.  Underwent ventricular drain placement.   On 12/24/2018, hospitalist service was consulted for management of his diabetes/fluctuating CBG and hypertension.    12/27/18: Patient was seen and examined this morning no acute issues.  He wants to go home.  Assessment/Plan: Active Problems:   Diabetes type 2, uncontrolled (HCC)   Essential hypertension   SDH (subdural hematoma) (HCC)  Controlled DM-2 with hyper and hypoglycemia: A1c 6.9% on 11/23/2018.  CBG fairly controlled. -Continue NovoLog to 4 units with meals -Continue modified SSI -Continue statin  Hypertension: Normotensive now. -Continue amlodipine, clonidine -Recommend discontinuing IV fluid   Headache: SDH?  Poor sleep?  Rebound headache?.  -Emphasized sleep hygiene -Minimize medication use  Recurrent subdural hematoma -Per neurosurgery.  ID: On ceftriaxone per primary.  I do not see indication. -Recommend discontinuing  History of depression/anxiety/mood disorder: Stable. -On Seroquel and Lexapro  Thank you for allowing [place] to participate in the care of this patient.  Signing off.   Objective: Vitals:   12/27/18 0739 12/27/18 1224 12/27/18 1225 12/27/18 1315  BP: 126/87 115/86  124/81  Pulse: 77 83 81 87  Resp: 20 20  (!) 25  Temp: 98.4 F (36.9 C) 98.9 F (37.2 C)    TempSrc: Oral Oral    SpO2: 98% 100% 100% 96%  Weight:      Height:        Intake/Output Summary (Last 24 hours) at 12/27/2018 1325 Last data filed at 12/27/2018 1100 Gross per 24 hour  Intake 1141.78 ml  Output 2500 ml  Net -1358.22 ml   Filed Weights   12/19/18 1820  Weight: 79.6 kg    Exam:  . General: 43 y.o.  year-old male well developed well nourished in no acute distress.  Alert and oriented x3. . Cardiovascular: Regular rate and rhythm with no rubs or gallops.  No thyromegaly or JVD noted.   Marland Kitchen Respiratory: Clear to auscultation with no wheezes or rales. Good inspiratory effort. . Abdomen: Soft nontender nondistended with normal bowel sounds x4 quadrants. . Musculoskeletal: No lower extremity edema. 2/4 pulses in all 4 extremities. Marland Kitchen Psychiatry: Mood is appropriate for condition and setting   Data Reviewed: CBC: Recent Labs  Lab 12/22/18 1529  WBC 9.4  NEUTROABS 8.3*  HGB 11.2*  HCT 32.3*  MCV 85.4  PLT 0000000   Basic Metabolic Panel: Recent Labs  Lab 12/22/18 1447  NA 132*  K 4.1  CL 94*  CO2 26  GLUCOSE 80  BUN <5*  CREATININE 0.49*  CALCIUM 8.4*   GFR: Estimated Creatinine Clearance: 132 mL/min (A) (by C-G formula based on SCr of 0.49 mg/dL (L)). Liver Function Tests: No results for input(s): AST, ALT, ALKPHOS, BILITOT, PROT, ALBUMIN in the last 168 hours. No results for input(s): LIPASE, AMYLASE in the last 168 hours. No results for input(s): AMMONIA in the last 168 hours. Coagulation Profile: No results for input(s): INR, PROTIME in the last 168 hours. Cardiac Enzymes: No results for input(s): CKTOTAL, CKMB, CKMBINDEX, TROPONINI in the last 168 hours. BNP (last 3 results) No results for input(s): PROBNP in the last 8760 hours. HbA1C: No results for input(s): HGBA1C in the last 72 hours. CBG: Recent Labs  Lab 12/26/18 1205 12/26/18 1744 12/26/18 2042 12/27/18 [id number]  12/27/18 1226  GLUCAP 188* 180* 166* 138* 182*   Lipid Profile: No results for input(s): CHOL, HDL, LDLCALC, TRIG, CHOLHDL, LDLDIRECT in the last 72 hours. Thyroid Function Tests: No results for input(s): TSH, T4TOTAL, FREET4, T3FREE, THYROIDAB in the last 72 hours. Anemia Panel: No results for input(s): VITAMINB12, FOLATE, FERRITIN, TIBC, IRON, RETICCTPCT in the last 72 hours. Urine analysis:     Component Value Date/Time   COLORURINE YELLOW 09/11/2018 1750   APPEARANCEUR CLEAR 09/11/2018 1750   LABSPEC 1.025 09/11/2018 1750   PHURINE 5.5 09/11/2018 1750   GLUCOSEU >=500 (A) 09/11/2018 1750   HGBUR NEGATIVE 09/11/2018 1750   BILIRUBINUR NEGATIVE 09/11/2018 1750   KETONESUR NEGATIVE 09/11/2018 1750   PROTEINUR NEGATIVE 09/11/2018 1750   UROBILINOGEN 0.2 11/17/2014 1315   NITRITE NEGATIVE 09/11/2018 1750   LEUKOCYTESUR NEGATIVE 09/11/2018 1750   Sepsis Labs: @LABRCNTIP (procalcitonin:4,lacticidven:4)  ) Recent Results (from the past 240 hour(s))  SARS Coronavirus 2 [facility] order, Performed in [facility] hospital lab) Nasopharyngeal Nasopharyngeal Swab     Status: None   Collection Time: 12/19/18  3:28 PM   Specimen: Nasopharyngeal Swab  Result Value Ref Range Status   SARS Coronavirus 2 NEGATIVE NEGATIVE Final    Comment: (NOTE) If result is NEGATIVE SARS-CoV-2 target nucleic acids are NOT DETECTED. The SARS-CoV-2 RNA is generally detectable in upper and lower  respiratory specimens during the acute phase of infection. The lowest  concentration of SARS-CoV-2 viral copies this assay can detect is 250  copies / mL. A negative result does not preclude SARS-CoV-2 infection  and should not be used as the sole basis for treatment or other  patient management decisions.  A negative result may occur with  improper specimen collection / handling, submission of specimen other  than nasopharyngeal swab, presence of viral mutation(s) within the  areas targeted by this assay, and inadequate number of viral copies  (<250 copies / mL). A negative result must be combined with clinical  observations, patient history, and epidemiological information. If result is POSITIVE SARS-CoV-2 target nucleic acids are DETECTED. The SARS-CoV-2 RNA is generally detectable in upper and lower  respiratory specimens dur ing the acute phase of infection.  Positive  results are indicative of active  infection with SARS-CoV-2.  Clinical  correlation with patient history and other diagnostic information is  necessary to determine patient infection status.  Positive results do  not rule out bacterial infection or co-infection with other viruses. If result is PRESUMPTIVE POSTIVE SARS-CoV-2 nucleic acids MAY BE PRESENT.   A presumptive positive result was obtained on the submitted specimen  and confirmed on repeat testing.  While 2019 novel coronavirus  (SARS-CoV-2) nucleic acids may be present in the submitted sample  additional confirmatory testing may be necessary for epidemiological  and / or clinical management purposes  to differentiate between  SARS-CoV-2 and other Sarbecovirus currently known to infect humans.  If clinically indicated additional testing with an alternate test  methodology [phone]) is advised. The SARS-CoV-2 RNA is generally  detectable in upper and lower respiratory sp ecimens during the acute  phase of infection. The expected result is Negative. Fact Sheet for Patients:  StrictlyIdeas.no Fact Sheet for Healthcare Providers: BankingDealers.co.za This test is not yet approved or cleared by the [place] FDA and has been authorized for detection and/or diagnosis of SARS-CoV-2 by FDA under an Emergency Use Authorization (EUA).  This EUA will remain in effect (meaning this test can be used) for the duration of the COVID-19 declaration under  Section 564(b)(1) of the Act, 21 U.S.C. section 360bbb-3(b)(1), unless the authorization is terminated or revoked sooner. Performed at [facility], [address] [place], [place] [address]   MRSA PCR Screening     Status: None   Collection Time: 12/19/18  6:50 PM   Specimen: Nasal Mucosa; Nasopharyngeal  Result Value Ref Range Status   MRSA by PCR NEGATIVE NEGATIVE Final    Comment:        The GeneXpert MRSA Assay (FDA approved for NASAL specimens only), is  one component of a comprehensive MRSA colonization surveillance program. It is not intended to diagnose MRSA infection nor to guide or monitor treatment for MRSA infections. Performed at [facility], [place] [address], [place], [place] [address]       Studies: No results found.  Scheduled Meds: . amLODipine  10 mg Oral Daily  . Chlorhexidine Gluconate Cloth  6 each Topical Daily  . cloNIDine  0.1 mg Oral TID  . docusate sodium  100 mg Oral BID  . escitalopram  20 mg Oral Daily  . feeding supplement (GLUCERNA SHAKE)  237 mL Oral TID BM  . folic acid  1 mg Oral Daily  . insulin aspart  0-3 Units Subcutaneous TID WC  . insulin aspart  4 Units Subcutaneous TID WC  . insulin glargine  10 Units Subcutaneous QHS  .  morphine injection  2 mg Intravenous Once  . pantoprazole  40 mg Oral QHS  . QUEtiapine  300 mg Oral QHS  . thiamine  100 mg Oral Daily    Continuous Infusions: . 0.9 % NaCl with KCl 20 mEq / L 75 mL/hr at 12/27/18 0622  . cefTRIAXone (ROCEPHIN)  IV 1 g (12/27/18 0812)  . levETIRAcetam 500 mg (12/27/18 0434)     LOS: 8 days     Kayleen Memos, MD [facility] Pager [phone]  If 7PM-7AM, please contact night-coverage [email or web address] Password [email or web address] 12/27/2018, 1:25 PM

## 2018-12-27 NOTE — Discharge Summary (Addendum)
Physician Discharge Summary    Providing Compassionate, Quality Care - Together   Patient ID: Willie [name] MRN: [id number] DOB/AGE: [birth year] 43 y.o.  Admit date: 12/19/2018 Discharge date: 12/27/2018  Admission Diagnoses: Subdural hematoma  Discharge Diagnoses:  Active Problems:   Diabetes type 2, uncontrolled (HCC)   Essential hypertension   SDH (subdural hematoma) (HCC)   Discharged Condition: good  Hospital Course: Patient underwent bilateral craniotomies approximately one month ago for SDH evacuation by Dr. Vertell Limber. He presented to the ED on 12/19/2018 with increased confusion. 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 Drain was removed on 12/22/2018. He has worked with therapies and his neurologic status has improved. He is ready to discharge home.  Consults: rehabilitation medicine  Significant Diagnostic Studies: Ct Head Wo Contrast  Result Date: 12/22/2018 CLINICAL DATA:  Altered mental status. Difficulty speaking. Subdural hematoma. EXAM: CT HEAD WITHOUT CONTRAST TECHNIQUE: Contiguous axial images were obtained from the base of the skull through the vertex without intravenous contrast. COMPARISON:  12/22/2018 FINDINGS: Brain: Left subdural drain has been removed. Thickness of the subdural material in the left frontal region is essentially unchanged at about 13 mm. Mass effect is similar with left-to-right shift of 3 mm. No additional bleeding identified. Brain parenchyma itself continues to be normal. No hydrocephalus. Vascular: No abnormal vascular finding. Skull: Bilateral craniotomy changes are stable. Sinuses/Orbits: Clear Other: None IMPRESSION: Left subdural drain removal. Stable left frontal subdural hematoma maximal thickness 13 mm with stable mass effect and 3 mm left-to-right shift. Electronically Signed   By: Nelson Chimes M.D.   On: 12/22/2018 14:02   Ct Head Wo Contrast  Result Date:  12/22/2018 CLINICAL DATA:  Subacute neuro deficit EXAM: CT HEAD WITHOUT CONTRAST TECHNIQUE: Contiguous axial images were obtained from the base of the skull through the vertex without intravenous contrast. COMPARISON:  Two days ago FINDINGS: Brain: Subdural hematoma with evacuation on the left. A subdural drain remains in place. The collection has increased from 11 to 15 mm, with further frontal mass effect, but no interval high-density hematoma. No infarct, hydrocephalus, or herniation. Midline shift measures 3-4 mm. Vascular: No hyperdense vessel or unexpected calcification. Skull: Small bilateral craniotomy. Sinuses/Orbits: Negative IMPRESSION: Increased left subdural hematoma but still low-density. The collection is increased from 11 to 15 mm in thickness and midline shift measures 3-4 mm. Electronically Signed   By: Monte Fantasia M.D.   On: 12/22/2018 05:06   Ct Head Wo Contrast  Result Date: 12/20/2018 CLINICAL DATA:  Follow-up examination for subdural hemorrhage. EXAM: CT HEAD WITHOUT CONTRAST TECHNIQUE: Contiguous axial images were obtained from the base of the skull through the vertex without intravenous contrast. COMPARISON:  Prior CT from 12/19/2018. FINDINGS: Brain: Postoperative changes from interval redo left frontal burr hole craniectomy and subdural drain placement. Tip of the subdural drain well positioned overlying the left frontal convexity. Few scattered postprocedural foci of pneumocephalus noted within the left extra-axial space. Subacute to chronic left subdural collection has been partially evacuated, decreased in size now measuring up to 11 mm in maximal thickness. Persistent small amount of more acute to subacute hyperdense blood products seen within this collection. Associated mass effect on the left cerebral hemisphere is improved with improved left-to-right midline shift now measuring approximately 3 mm. No hydrocephalus or ventricular trapping. Basilar cisterns remain patent. No  other new acute intracranial hemorrhage. No large vessel territory infarct. Vascular: No unexpected hyperdense vessel. Skull: Postoperative swelling and emphysema at  the left frontotemporal scalp from subdural drain placement. Underlying previous bilateral craniotomies noted. Sinuses/Orbits: Globes and orbital soft tissues within normal limits. Paranasal sinuses and mastoid air cells remain clear. Other: None. IMPRESSION: 1. Postoperative changes from interval redo left frontal burr hole craniectomy and subdural drain placement. Tip of the subdural drain well positioned overlying the left frontal convexity. 2. Subacute to chronic left subdural collection has been partially evacuated and is decreased in size, now measuring up to 11 mm in maximal thickness. Improved mass effect with improved left-to-right midline shift now measuring 3 mm. 3. No other new acute intracranial abnormality. Electronically Signed   By: Jeannine Boga M.D.   On: 12/20/2018 02:20   Ct Head Wo Contrast  Result Date: 12/19/2018 CLINICAL DATA:  New seizure, status post craniotomy for subdural hematoma EXAM: CT HEAD WITHOUT CONTRAST TECHNIQUE: Contiguous axial images were obtained from the base of the skull through the vertex without intravenous contrast. COMPARISON:  11/26/2018 FINDINGS: Brain: There is a mostly low-attenuation subdural collection about the left frontal lobe with minimal, layering high attenuation internally and containing tiny locules of internal air (series 2, image 23). Maximum coronal thickness is 2.4 cm and there is effacement of the left-sided sulci and 9 mm left right midline shift. There is no significant right-sided subdural collection. Vascular: No hyperdense vessel or unexpected calcification. Skull: Redemonstrated biparietal craniotomies. Negative for fracture or focal lesion. Sinuses/Orbits: No acute finding. Other: None. IMPRESSION: 1. There is a mostly low-attenuation subdural collection about the left  frontal lobe with minimal, layering high attenuation internally and containing tiny locules of internal air (series 2, image 23). Maximum coronal thickness is 2.4 cm and there is effacement of the left-sided sulci and 9 mm left right midline shift. Findings are consistent with reaccumulation or recurrence of a previously evacuation subdural hematoma. 2. No significant persistent or recurrent right-sided subdural collection. 3. Redemonstrated biparietal craniotomies. Electronically Signed   By: Eddie Candle M.D.   On: 12/19/2018 14:49     Treatments: Subdural drain placement  Discharge Exam: Blood pressure 126/87, pulse 77, temperature 98.4 F (36.9 C), temperature source Oral, resp. rate 20, height 6' (1.829 m), weight 79.6 kg, SpO2 98 %.  Alert and oriented x 4 PERRLA CN II-XII grossly intact MAE, Strength intact No pronator drift observed Incision clean, dry, and intact  Disposition: Discharge disposition: 01-Home or Self Care        Allergies as of 12/27/2018      Reactions   Invokamet [canagliflozin-metformin Hcl] Other (See Comments)   Lactic acidosis   Metformin And Related Other (See Comments)   DRASTIC drop in blood sugar      Medication List    TAKE these medications   amLODipine 10 MG tablet Commonly known as: NORVASC Take 1 tablet (10 mg total) by mouth daily.   blood glucose meter kit and supplies Kit Dispense based on patient and insurance preference. Use up to four times daily as directed. (FOR ICD-9 250.00, 250.01).   escitalopram 20 MG tablet Commonly known as: LEXAPRO Take 1 tablet (20 mg total) by mouth daily.   freestyle lancets 2 (two) times daily   FreeStyle Libre 14 Day Sensor Misc Inject 1 each into the skin every 14 (fourteen) days.   glucose blood test strip Commonly known as: FREESTYLE LITE Use as instructed   insulin lispro 100 UNIT/ML KwikPen Commonly known as: HumaLOG KwikPen Inject subcutaneously 3 times a day (just before each  meal) 09-10-08 units Humalog 5 units with  small meal, 10 units with medium sized meal, and 15 units with large meal. What changed:   how much to take  how to take this  when to take this  additional instructions   Insulin Pen Needle 31G X 8 MM Misc Commonly known as: UltiCare Short Pen Needles Use once daily to inject insulin.   Lantus SoloStar 100 UNIT/ML Solostar Pen Generic drug: Insulin Glargine Inject 20-25 Units into the skin at bedtime. What changed: how much to take   levETIRAcetam 500 MG tablet Commonly known as: KEPPRA Take 1 tablet (500 mg total) by mouth 2 (two) times daily for 14 days.   LORazepam 1 MG tablet Commonly known as: ATIVAN Take 1 tablet (1 mg total) by mouth every 8 (eight) hours as needed for anxiety. What changed: when to take this   multivitamin tablet Take 1 tablet by mouth daily.   ondansetron 4 MG disintegrating tablet Commonly known as: Zofran ODT 59m ODT q4 hours prn nausea/vomit What changed:   how much to take  how to take this  when to take this  reasons to take this  additional instructions   pantoprazole 40 MG tablet Commonly known as: PROTONIX Take 1 tablet (40 mg total) by mouth 2 (two) times daily.   QUEtiapine 300 MG tablet Commonly known as: SEROQUEL Take 1 tablet (300 mg total) by mouth at bedtime.   traMADol 50 MG tablet Commonly known as: Ultram Take 1-2 tablets (50-100 mg total) by mouth every 6 (six) hours as needed for moderate pain or severe pain.      Follow-up Information    SErline Levine MD. Schedule an appointment as soon as possible for a visit in 7 day(s).   Specialty: Neurosurgery Contact information: [address] [phone]          Signed: MPatricia Nettle8/30/2020, 12:08 PM

## 2018-12-27 NOTE — Plan of Care (Signed)
  Problem: Education: Goal: Knowledge of General Education information will improve Description: Including pain rating scale, medication(s)/side effects and non-pharmacologic comfort measures Outcome: Completed/Met   Problem: Health Behavior/Discharge Planning: Goal: Ability to manage health-related needs will improve Outcome: Completed/Met   Problem: Clinical Measurements: Goal: Ability to maintain clinical measurements within normal limits will improve Outcome: Completed/Met Goal: Will remain free from infection Outcome: Completed/Met Goal: Diagnostic test results will improve Outcome: Completed/Met Goal: Respiratory complications will improve Outcome: Completed/Met Goal: Cardiovascular complication will be avoided Outcome: Completed/Met   Problem: Activity: Goal: Risk for activity intolerance will decrease Outcome: Completed/Met   Problem: Nutrition: Goal: Adequate nutrition will be maintained Outcome: Completed/Met   Problem: Coping: Goal: Level of anxiety will decrease Outcome: Completed/Met   Problem: Elimination: Goal: Will not experience complications related to bowel motility Outcome: Completed/Met Goal: Will not experience complications related to urinary retention Outcome: Completed/Met   Problem: Pain Managment: Goal: General experience of comfort will improve Outcome: Completed/Met   Problem: Safety: Goal: Ability to remain free from injury will improve Outcome: Completed/Met   Problem: Skin Integrity: Goal: Risk for impaired skin integrity will decrease Outcome: Completed/Met   Problem: Education: Goal: Knowledge of disease or condition will improve Outcome: Completed/Met Goal: Understanding of discharge needs will improve Outcome: Completed/Met   Problem: Health Behavior/Discharge Planning: Goal: Ability to identify changes in lifestyle to reduce recurrence of condition will improve Outcome: Completed/Met Goal: Identification of resources  available to assist in meeting health care needs will improve Outcome: Completed/Met   Problem: Physical Regulation: Goal: Complications related to the disease process, condition or treatment will be avoided or minimized Outcome: Completed/Met   Problem: Safety: Goal: Ability to remain free from injury will improve Outcome: Completed/Met

## 2018-12-29 DIAGNOSIS — S065X9A Traumatic subdural hemorrhage with loss of consciousness of unspecified duration, initial encounter: Secondary | ICD-10-CM

## 2018-12-29 DIAGNOSIS — S065XAA Traumatic subdural hemorrhage with loss of consciousness status unknown, initial encounter: Secondary | ICD-10-CM

## 2018-12-29 HISTORY — DX: Traumatic subdural hemorrhage with loss of consciousness of unspecified duration, initial encounter: S06.5X9A

## 2018-12-29 HISTORY — DX: Traumatic subdural hemorrhage with loss of consciousness status unknown, initial encounter: S06.5XAA

## 2018-12-31 ENCOUNTER — Telehealth: Payer: Self-pay | Admitting: Family

## 2018-12-31 ENCOUNTER — Other Ambulatory Visit: Payer: Self-pay | Admitting: Family

## 2018-12-31 NOTE — Telephone Encounter (Signed)
Please advise 

## 2018-12-31 NOTE — Telephone Encounter (Signed)
Per patient, wanted to let provider know the oxycodone was given to him while in the hospital, he came out on Monday and still having a lot of pain. His neurosurgery Dr. Krystal Clark him tramadol.

## 2018-12-31 NOTE — Telephone Encounter (Signed)
He should request oxycodone refill from neurosurgery. I do not recommend ambien given his other medications and recent brain trauma.

## 2018-12-31 NOTE — Telephone Encounter (Signed)
Pt is requesting a refill for Ambian and oxycodone.    Pharm[facility]: [facility] [address]

## 2019-01-01 ENCOUNTER — Encounter: Payer: Self-pay | Admitting: Family

## 2019-01-01 MED ORDER — LORAZEPAM 1 MG PO TABS: 1.0000 mg | ORAL_TABLET | Freq: Three times a day (TID) | ORAL | 0 refills | Status: DC | PRN

## 2019-01-01 NOTE — Telephone Encounter (Signed)
Pain meds should come from neurosurgery.  If tramadol is not working he should let his neurosurgeon know.

## 2019-01-01 NOTE — Telephone Encounter (Signed)
Patient was notified to contact his neurosurgeon.

## 2019-01-04 IMAGING — DX DG ABDOMEN 2V
3 series · 3 of 3 positions shown · non-contrast
Comparison: 07/04/2017.

CLINICAL DATA: Mid abdominal pain. History of colonoscopy and
polypectomy on 07/06/2017.

EXAM:
ABDOMEN - 2 VIEW

[abdomen erect]
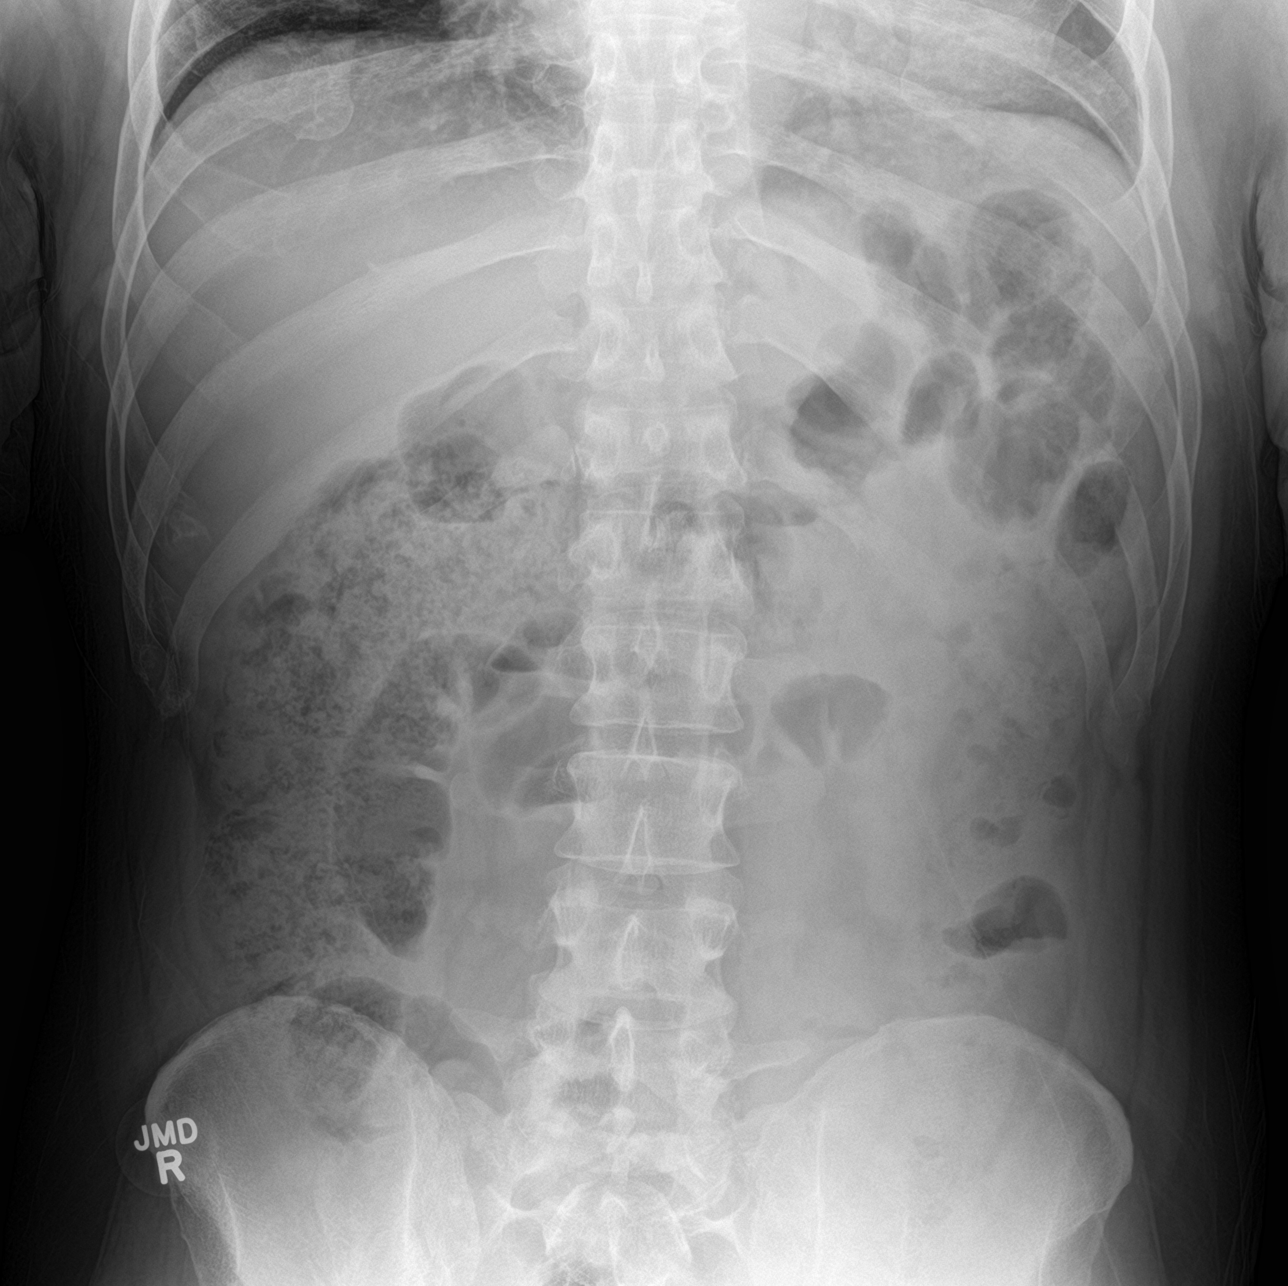

[abdomen supine (1 of 2)]
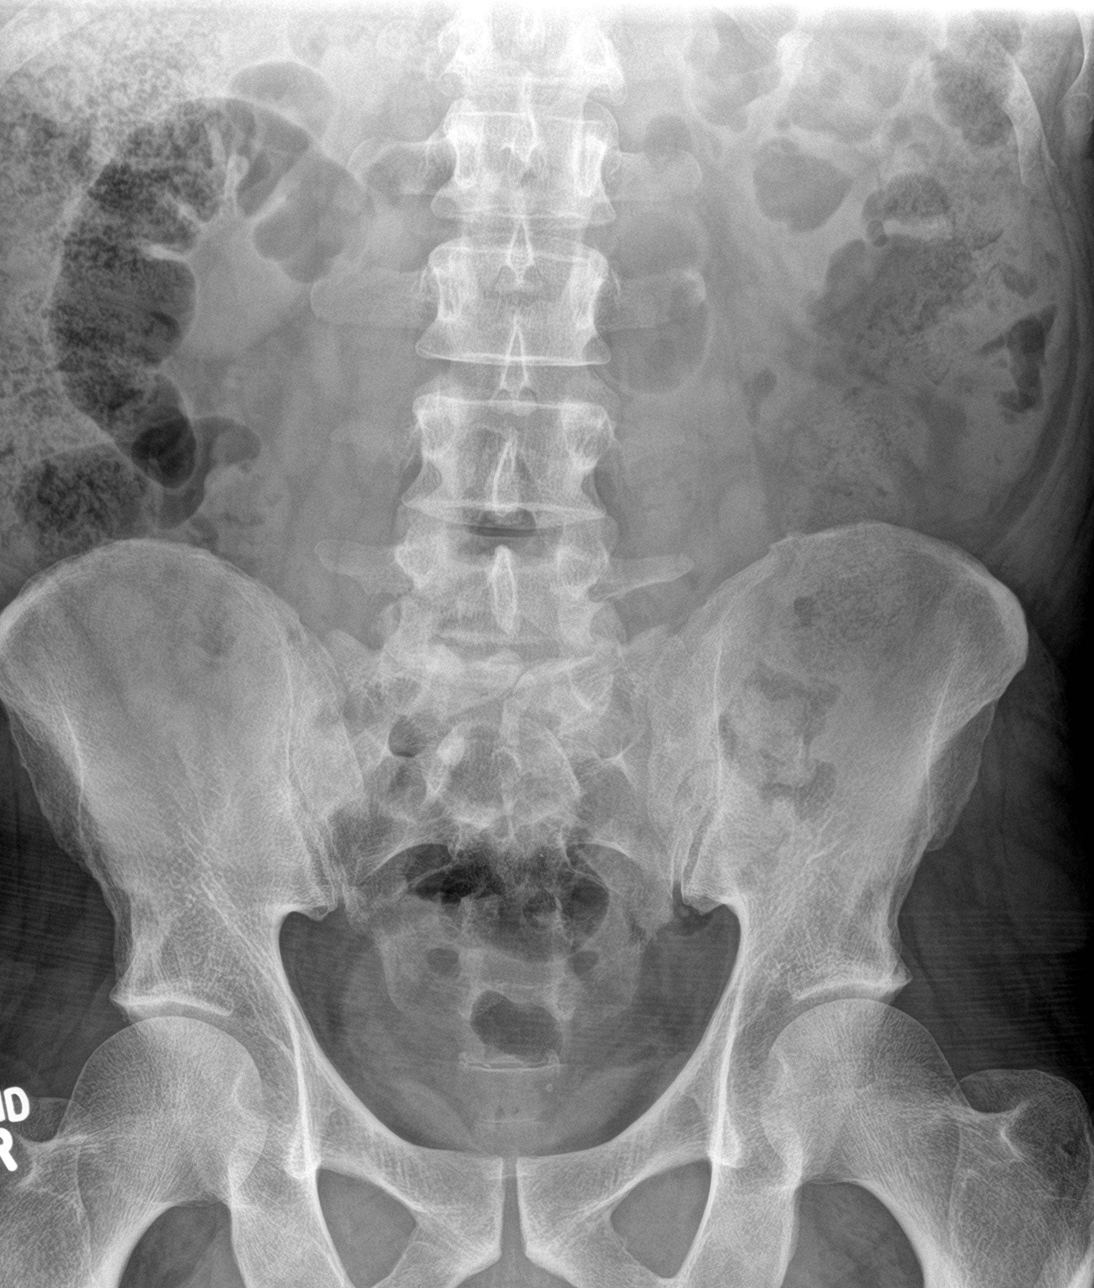

[abdomen supine (2 of 2)]
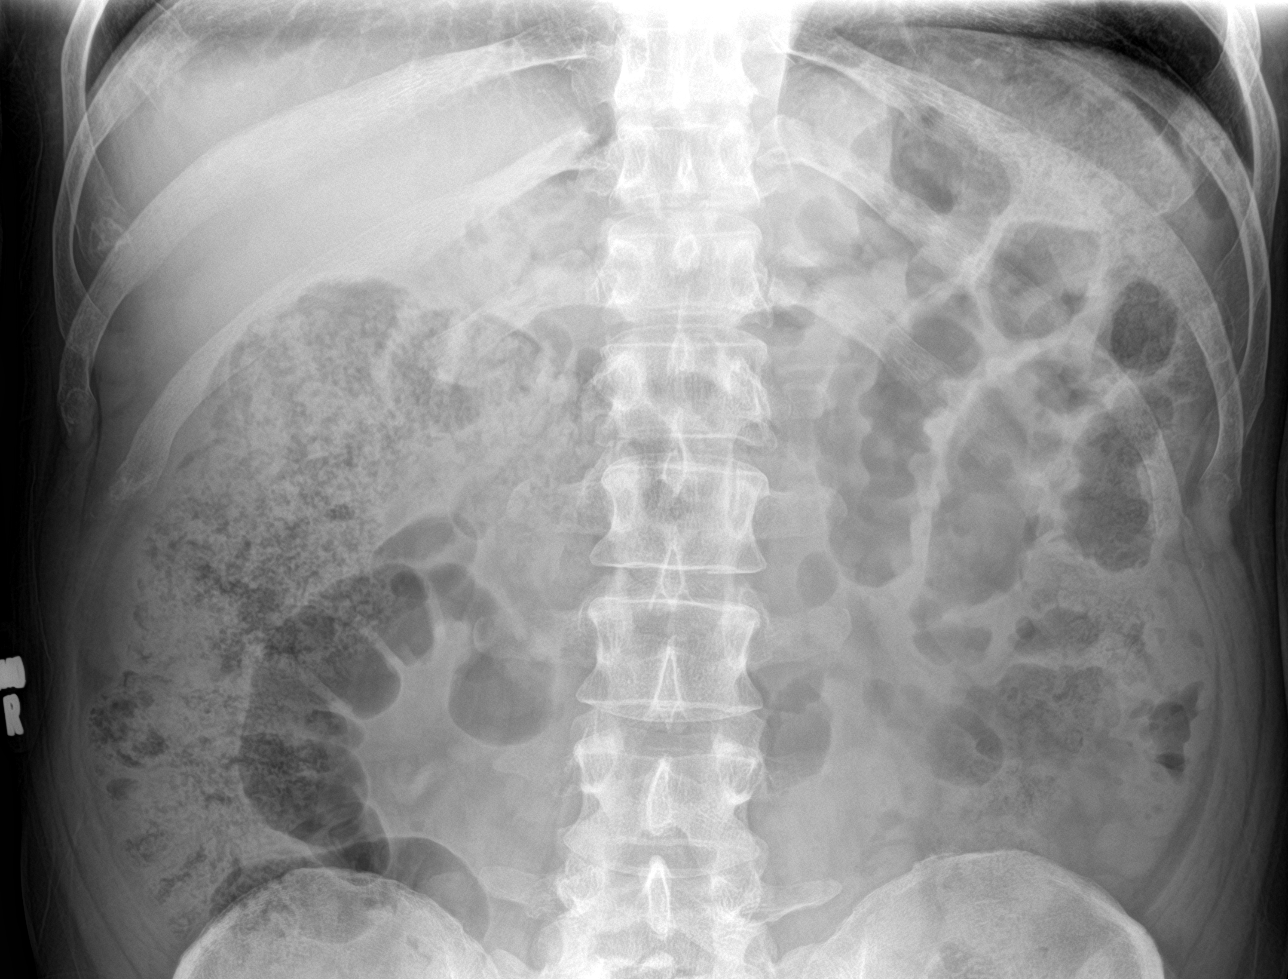

[3 of 3 positions shown; findings below may reference images not displayed]

FINDINGS: On the upright projection of the abdomen, no free intraperitoneal
gas is observed. There is formed stool in the colon. No appreciable
dilated bowel or significant abnormal calcifications.
IMPRESSION: 1. Formed stool is present in the colon. No dilated bowel or free
intraperitoneal gas identified. If atypical pain persists despite
conservative therapy, CT scan may be helpful.

## 2019-01-05 ENCOUNTER — Inpatient Hospital Stay (HOSPITAL_COMMUNITY)
Admission: EM | Admit: 2019-01-05 | Discharge: 2019-01-14 | DRG: 023 | Disposition: A | Discharge status: Home / Self Care | Payer: Self-pay | Source: Ambulatory Visit | Attending: Neurosurgery | Admitting: Neurosurgery

## 2019-01-05 ENCOUNTER — Other Ambulatory Visit: Payer: Self-pay

## 2019-01-05 DIAGNOSIS — I10 Essential (primary) hypertension: Secondary | ICD-10-CM | POA: Diagnosis present

## 2019-01-05 DIAGNOSIS — E7800 Pure hypercholesterolemia, unspecified: Secondary | ICD-10-CM | POA: Diagnosis present

## 2019-01-05 DIAGNOSIS — E785 Hyperlipidemia, unspecified: Secondary | ICD-10-CM | POA: Diagnosis present

## 2019-01-05 DIAGNOSIS — F1010 Alcohol abuse, uncomplicated: Secondary | ICD-10-CM | POA: Diagnosis present

## 2019-01-05 DIAGNOSIS — G4730 Sleep apnea, unspecified: Secondary | ICD-10-CM | POA: Diagnosis present

## 2019-01-05 DIAGNOSIS — R627 Adult failure to thrive: Secondary | ICD-10-CM | POA: Diagnosis present

## 2019-01-05 DIAGNOSIS — Z9289 Personal history of other medical treatment: Secondary | ICD-10-CM

## 2019-01-05 DIAGNOSIS — Z781 Physical restraint status: Secondary | ICD-10-CM

## 2019-01-05 DIAGNOSIS — K861 Other chronic pancreatitis: Secondary | ICD-10-CM | POA: Diagnosis present

## 2019-01-05 DIAGNOSIS — F17210 Nicotine dependence, cigarettes, uncomplicated: Secondary | ICD-10-CM | POA: Diagnosis present

## 2019-01-05 DIAGNOSIS — Z20828 Contact with and (suspected) exposure to other viral communicable diseases: Secondary | ICD-10-CM | POA: Diagnosis present

## 2019-01-05 DIAGNOSIS — E440 Moderate protein-calorie malnutrition: Secondary | ICD-10-CM | POA: Diagnosis present

## 2019-01-05 DIAGNOSIS — Z79899 Other long term (current) drug therapy: Secondary | ICD-10-CM

## 2019-01-05 DIAGNOSIS — K8681 Exocrine pancreatic insufficiency: Secondary | ICD-10-CM | POA: Diagnosis present

## 2019-01-05 DIAGNOSIS — F39 Unspecified mood [affective] disorder: Secondary | ICD-10-CM | POA: Diagnosis present

## 2019-01-05 DIAGNOSIS — K219 Gastro-esophageal reflux disease without esophagitis: Secondary | ICD-10-CM | POA: Diagnosis present

## 2019-01-05 DIAGNOSIS — Z87820 Personal history of traumatic brain injury: Secondary | ICD-10-CM

## 2019-01-05 DIAGNOSIS — J9690 Respiratory failure, unspecified, unspecified whether with hypoxia or hypercapnia: Secondary | ICD-10-CM

## 2019-01-05 DIAGNOSIS — R402232 Coma scale, best verbal response, inappropriate words, at arrival to emergency department: Secondary | ICD-10-CM | POA: Diagnosis present

## 2019-01-05 DIAGNOSIS — R402142 Coma scale, eyes open, spontaneous, at arrival to emergency department: Secondary | ICD-10-CM | POA: Diagnosis present

## 2019-01-05 DIAGNOSIS — Z833 Family history of diabetes mellitus: Secondary | ICD-10-CM

## 2019-01-05 DIAGNOSIS — Z888 Allergy status to other drugs, medicaments and biological substances status: Secondary | ICD-10-CM

## 2019-01-05 DIAGNOSIS — G062 Extradural and subdural abscess, unspecified: Principal | ICD-10-CM

## 2019-01-05 DIAGNOSIS — IMO0001 Reserved for inherently not codable concepts without codable children: Secondary | ICD-10-CM

## 2019-01-05 DIAGNOSIS — I6203 Nontraumatic chronic subdural hemorrhage: Secondary | ICD-10-CM | POA: Diagnosis present

## 2019-01-05 DIAGNOSIS — F329 Major depressive disorder, single episode, unspecified: Secondary | ICD-10-CM | POA: Diagnosis present

## 2019-01-05 DIAGNOSIS — I6201 Nontraumatic acute subdural hemorrhage: Secondary | ICD-10-CM

## 2019-01-05 DIAGNOSIS — Z794 Long term (current) use of insulin: Secondary | ICD-10-CM

## 2019-01-05 DIAGNOSIS — Z6822 Body mass index (BMI) 22.0-22.9, adult: Secondary | ICD-10-CM

## 2019-01-05 DIAGNOSIS — F419 Anxiety disorder, unspecified: Secondary | ICD-10-CM | POA: Diagnosis present

## 2019-01-05 DIAGNOSIS — R402342 Coma scale, best motor response, flexion withdrawal, at arrival to emergency department: Secondary | ICD-10-CM | POA: Diagnosis present

## 2019-01-05 DIAGNOSIS — E119 Type 2 diabetes mellitus without complications: Secondary | ICD-10-CM | POA: Diagnosis present

## 2019-01-05 MED ORDER — LORAZEPAM 2 MG/ML IJ SOLN
2.0000 mg | Freq: Once | INTRAMUSCULAR | Status: AC
  Administered 2019-01-05: 2 mg via INTRAMUSCULAR
  Filled 2019-01-05: qty 1

## 2019-01-05 MED ORDER — HALOPERIDOL LACTATE 5 MG/ML IJ SOLN
5.0000 mg | Freq: Once | INTRAMUSCULAR | Status: DC
  Filled 2019-01-05: qty 1

## 2019-01-05 NOTE — ED Provider Notes (Signed)
[facility] DEPT Provider Note   CSN: [id number] Arrival date & time: 01/05/19  2215     History   Chief Complaint Chief Complaint  Patient presents with  . Failure To Thrive    HPI Willie [name] is a 43 y.o. male.     Patient to ED by EMS who states they were called by his wife for changes in his baseline mental status over the last week described as not taking care of personal hygiene, less able to walk with steady gait. History of TBI, normally combative, which is unchanged per EMS. Wife unavailable for collateral history. Per chart review the patient had a recent SDH in July requiring bilateral craniotomies by Dr. Vertell Limber. He returned to the ED on 12/19/2018 with increased confusion and found to have recurrent chronic left-sided subdural hematoma, had a subdural drain placed that was removed on 12/22/2018. The patient cannot contribute to history.   The history is provided by the patient and the EMS personnel. No language interpreter was used.    Past Medical History:  Diagnosis Date  . Alcohol abuse   . Alcoholic pancreatitis    recurrent  . Depression   . Diabetes mellitus, type II ([place])    New Onset 03/2010  . GERD (gastroesophageal reflux disease)   . High cholesterol   . 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 Hypertension   . Sleep apnea    does not wear a CPAP    Patient Active Problem List   Diagnosis Date Noted  . Subdural bleeding ([place]) 11/24/2018  . Abdominal pain 11/22/2018  . SDH (subdural hematoma) ([place]) 11/22/2018  . Abnormal LFTs 11/22/2018  . IDDM (insulin dependent diabetes mellitus) ([place]) 09/12/2018  . Acute hepatitis 09/11/2018  . Alcohol-induced mood disorder ([place])   . DKA (diabetic ketoacidoses) ([place]) 07/28/2017  . Alcohol withdrawal ([place]) 07/28/2017  . Abnormal CT scan, colon   . Benign neoplasm of descending colon   . Lower GI bleed 07/04/2017  .  Transaminitis 07/04/2017  . Pain   . Bleeding internal hemorrhoids   . Anemia of chronic disease   . Left arm weakness   . Anemia, blood loss   . Vomiting alone 03/27/2017  . Chest pain 03/27/2017  . Nausea and vomiting   . Lactic acidosis 03/26/2017  . History of alcohol abuse 06/25/2016  . Tobacco abuse 06/25/2016  . Anxiety state 11/20/2015  . Dental infection 07/10/2015  . Mood disorder ([place]) 04/18/2015  . Eczema 06/27/2014  . GERD (gastroesophageal reflux disease) 05/24/2014  . Snoring 04/07/2013  . Suicide attempt by substance overdose ([place]) 02/10/2013  . Alcohol abuse 12/31/2012  . Erectile dysfunction 12/09/2012  . Hematuria 06/11/2012  . Vision problem 02/02/2012  . Insomnia 12/20/2011  . Chronic pancreatitis ([place]) 02/05/2011  . Depression with anxiety 11/13/2010  . Hyperlipemia 06/27/2010  . Diabetes type 2, uncontrolled ([place]) 05/22/2010  . Essential hypertension 05/22/2010    Past Surgical History:  Procedure Laterality Date  . COLONOSCOPY W/ BIOPSIES  09/2010   Dr. Ardis Hughs.  for intermittent rectal bleeding.  Mild sigmoid to descending diverticulosis.  Mild left colon erythema, benign biopsy, probably "prep effect"  . COLONOSCOPY WITH PROPOFOL N/A 07/06/2017   Procedure: COLONOSCOPY WITH PROPOFOL;  Surgeon: Jerene Bears, MD;  Location: [facility];  Service: Gastroenterology;  Laterality: N/A;  . CRANIOTOMY Bilateral 11/24/2018   Procedure: Bilateral craniotomy for evacuation of subdural hematoma;  Surgeon: Erline Levine, MD;  Location: [facility];  Service: Neurosurgery;  Laterality: Bilateral;  Bilateral craniotomy for evacuation of subdural hematoma  . LUMBAR MICRODISCECTOMY  ~ 2004   Dr Ellene Route        Home Medications    Prior to Admission medications   Medication Sig Start Date End Date Taking? Authorizing Provider  amLODipine (NORVASC) 10 MG tablet Take 1 tablet (10 mg total) by mouth daily. 11/28/18 12/28/18  Arrien, Jimmy Picket, MD  blood glucose meter  kit and supplies KIT Dispense based on patient and insurance preference. Use up to four times daily as directed. (FOR ICD-9 250.00, 250.01). 07/24/18   Debbrah Alar, NP  Continuous Blood Gluc Sensor (FREESTYLE LIBRE 14 DAY SENSOR) MISC Inject 1 each into the skin every 14 (fourteen) days. 04/27/18   Debbrah Alar, NP  escitalopram (LEXAPRO) 20 MG tablet Take 1 tablet (20 mg total) by mouth daily. 08/17/18   Debbrah Alar, NP  glucose blood (FREESTYLE LITE) test strip Use as instructed 07/24/18   Debbrah Alar, NP  Insulin Glargine (LANTUS SOLOSTAR) 100 UNIT/ML Solostar Pen Inject 20-25 Units into the skin at bedtime. Patient taking differently: Inject 25 Units into the skin at bedtime.  12/04/18   Debbrah Alar, NP  insulin lispro (HUMALOG KWIKPEN) 100 UNIT/ML KwikPen Inject subcutaneously 3 times a day (just before each meal) 09-10-08 units Humalog 5 units with small meal, 10 units with medium sized meal, and 15 units with large meal. Patient taking differently: Inject 5-10 Units into the skin See admin instructions. Inject 5-10 units subcutaneously with each meal - based on CBG 12/04/18   Debbrah Alar, NP  Insulin Pen Needle (ULTICARE SHORT PEN NEEDLES) 31G X 8 MM MISC Use once daily to inject insulin. 04/27/18   Debbrah Alar, NP  Lancets (FREESTYLE) lancets 2 (two) times daily 02/27/17   Debbrah Alar, NP  levETIRAcetam (KEPPRA) 500 MG tablet Take 1 tablet (500 mg total) by mouth 2 (two) times daily for 14 days. 12/27/18 01/10/19  Viona Gilmore D, NP  LORazepam (ATIVAN) 1 MG tablet Take 1 tablet (1 mg total) by mouth every 8 (eight) hours as needed for anxiety. 01/01/19   Debbrah Alar, NP  Multiple Vitamins-Minerals (MULTIVITAMIN) tablet Take 1 tablet by mouth daily. 02/12/16   Palumbo, April, MD  ondansetron (ZOFRAN ODT) 4 MG disintegrating tablet 28m ODT q4 hours prn nausea/vomit Patient taking differently: Take 4 mg by mouth every 4 (four) hours as  needed for nausea or vomiting.  10/13/17   BMalvin Johns MD  pantoprazole (PROTONIX) 40 MG tablet Take 1 tablet (40 mg total) by mouth 2 (two) times daily. 12/04/18   ODebbrah Alar NP  QUEtiapine (SEROQUEL) 300 MG tablet Take 1 tablet (300 mg total) by mouth at bedtime. 12/04/18   ODebbrah Alar NP  traMADol (ULTRAM) 50 MG tablet Take 1-2 tablets (50-100 mg total) by mouth every 6 (six) hours as needed for moderate pain or severe pain. 12/27/18 12/27/19  BViona GilmoreD, NP  traMADol (ULTRAM) 50 MG tablet Take 1-2 tablets (50-100 mg total) by mouth every 6 (six) hours as needed. 12/27/18 12/27/19  BViona GilmoreD, NP    Family History Family History  Problem Relation Age of Onset  . Diabetes Mother   . Lung cancer Brother        twin brother  . Pancreatic cancer Paternal Aunt   . Colon cancer Neg Hx   . Stomach cancer Neg Hx     Social History Social History  Tobacco Use  . Smoking status: Former Smoker    Packs/day: 0.25    Years: 1.00    Pack years: 0.25    Types: Cigarettes    Start date: 04/29/2016  . Smokeless tobacco: Never Used  . Tobacco comment: 4 Cigarettes a day   Substance Use Topics  . Alcohol use: Yes    Comment: 12 pack beer a day  . Drug use: Not Currently    Comment: none in years     Allergies   Invokamet [canagliflozin-metformin hcl] and Metformin and related   Review of Systems Review of Systems  Unable to perform ROS: Mental status change     Physical Exam Updated Vital Signs BP 119/85 (BP Location: Left Arm)   Pulse 87   Temp 97.8 F (36.6 C) (Oral)   Resp 17   SpO2 100%   Physical Exam Constitutional:      Appearance: He is well-developed.  HENT:     Head: Normocephalic.     Comments: Bilateral temporal incisional scars, remnant sutures on left.     Right Ear: Tympanic membrane normal.     Left Ear: Tympanic membrane normal.     Ears:     Comments: No hemotympanum.    Mouth/Throat:     Mouth: Mucous membranes are  moist.  Eyes:     Pupils: Pupils are equal, round, and reactive to light.  Neck:     Musculoskeletal: Normal range of motion and neck supple.  Cardiovascular:     Rate and Rhythm: Normal rate and regular rhythm.     Heart sounds: No murmur.  Pulmonary:     Effort: Pulmonary effort is normal.     Breath sounds: Normal breath sounds. No wheezing, rhonchi or rales.  Chest:     Chest wall: No tenderness.  Abdominal:     General: Bowel sounds are normal.     Palpations: Abdomen is soft.     Tenderness: There is no abdominal tenderness. There is no guarding or rebound.  Musculoskeletal: Normal range of motion.  Skin:    General: Skin is warm and dry.     Findings: No rash.  Neurological:     Mental Status: He is alert.     GCS: GCS eye subscore is 4. GCS verbal subscore is 3. GCS motor subscore is 4.     Cranial Nerves: Dysarthria present. No facial asymmetry.     Motor: No weakness or abnormal muscle tone.     Comments: Patient confused. Does not follow command. Slurred speech, unknown baseline. No facial asymmetry. Moves all extremities with strength. Combative, uncooperative.       ED Treatments / Results  Labs (all labs ordered are listed, but only abnormal results are displayed) Labs Reviewed  CBC WITH DIFFERENTIAL/PLATELET  BASIC METABOLIC PANEL  CBG MONITORING, ED   Results for orders placed or performed during the hospital encounter of 01/05/19  CBC with Differential  Result Value Ref Range   WBC 9.5 4.0 - 10.5 K/uL   RBC 3.98 (L) 4.22 - 5.81 MIL/uL   Hemoglobin 11.3 (L) 13.0 - 17.0 g/dL   HCT 36.2 (L) 39.0 - 52.0 %   MCV 91.0 80.0 - 100.0 fL   MCH 28.4 26.0 - 34.0 pg   MCHC 31.2 30.0 - 36.0 g/dL   RDW 13.6 11.5 - 15.5 %   Platelets 1,312 (HH) 150 - 400 K/uL   nRBC 0.0 0.0 - 0.2 %   Neutrophils Relative % 75 %  Neutro Abs 7.2 1.7 - 7.7 K/uL   Lymphocytes Relative 16 %   Lymphs Abs 1.6 0.7 - 4.0 K/uL   Monocytes Relative 6 %   Monocytes Absolute 0.5 0.1 -  1.0 K/uL   Eosinophils Relative 1 %   Eosinophils Absolute 0.1 0.0 - 0.5 K/uL   Basophils Relative 1 %   Basophils Absolute 0.1 0.0 - 0.1 K/uL   Immature Granulocytes 1 %   Abs Immature Granulocytes 0.08 (H) 0.00 - 0.07 K/uL  Basic metabolic panel  Result Value Ref Range   Sodium 140 135 - 145 mmol/L   Potassium 3.7 3.5 - 5.1 mmol/L   Chloride 104 98 - 111 mmol/L   CO2 24 22 - 32 mmol/L   Glucose, Bld 119 (H) 70 - 99 mg/dL   BUN <5 (L) 6 - 20 mg/dL   Creatinine, Ser 0.58 (L) 0.61 - 1.24 mg/dL   Calcium 8.5 (L) 8.9 - 10.3 mg/dL   GFR calc non Af Amer >60 >60 mL/min   GFR calc Af Amer >60 >60 mL/min   Anion gap 12 5 - 15  CBG monitoring, ED  Result Value Ref Range   Glucose-Capillary 111 (H) 70 - 99 mg/dL     EKG None  Radiology No results found.  Procedures Procedures (including critical care time) CRITICAL CARE Performed by: Dewaine Oats   Total critical care time: 65 minutes  Critical care time was exclusive of separately billable procedures and treating other patients.  Critical care was necessary to treat or prevent imminent or life-threatening deterioration.  Critical care was time spent personally by me on the following activities: development of treatment plan with patient and/or surrogate as well as nursing, discussions with consultants, evaluation of patient's response to treatment, examination of patient, obtaining history from patient or surrogate, ordering and performing treatments and interventions, ordering and review of laboratory studies, ordering and review of radiographic studies, pulse oximetry and re-evaluation of patient's condition.  Medications Ordered in ED Medications  haloperidol lactate (HALDOL) injection 5 mg (has no administration in time range)  LORazepam (ATIVAN) injection 2 mg (2 mg Intramuscular Given 01/05/19 2300)     Initial Impression / Assessment and Plan / ED Course  I have reviewed the triage vital signs and the nursing  notes.  Pertinent labs & imaging results that were available during my care of the patient were reviewed by me and considered in my medical decision making (see chart for details).        Patient to ED with history by EMS of decreased self care at home, unable to manage usual activities. Wife unavailable for additional history.   Chart reviewed. Patient with history of TBI but appears to make calls on his own behalf to his physicians. Here has confused demeanor, slurred speech/dysarthria. He is combative requiring chemical and soft physical restraints. CT head felt important to evaluation but unable to obtain study at this time. Ativan used for sedation as patient has a QT prolongation.   Multiple doses of ativan provided with little calming effect. Benadryl ordered. Will continue to observe. Noted to be tachycardic, mildly elevated pressures. Mental status unchanged.   Platelets found to be significantly elevated at 1312K, normal 2 weeks ago. Normal hgb, no leukocytosis.   3:50 - Patient continues to be combative despite attempts at sedation. Dr. Florina Ou involved in patient care. IV Versed - 10 mg administered and able to obtain CT head.    4:00 - Per radiology, head CT shows  markedly increased midline shift of 11 mm (3-4 on previous study) with large fluid collection - seroma vs subacute bleed vs infection. Discussed with Dr. Annette Stable, neurosurgery, who advises he will take patient to OR as soon as possible.   Patient intubated by Dr. Florina Ou. VSS. Prepping for transfer.    Final Clinical Impressions(s) / ED Diagnoses   Final diagnoses:  None   1. Altered Mental Status 2. Acute on chronic SDH, midline shift  ED Discharge Orders    None       Charlann Lange, PA-C 01/06/19 0450    Shanon Rosser, MD 01/28/19 0410

## 2019-01-05 NOTE — ED Triage Notes (Signed)
Pt arrived via [facility] after being called out by patients girlfriend, pt has a history of TBI and reporting he has not been able to take care of himself over the last week, refusing to clean himself up and difficulty getting around the house. Per EMS pts family stating that he is at his baseline but she needs help taking care of him.

## 2019-01-06 ENCOUNTER — Emergency Department (HOSPITAL_COMMUNITY): Payer: Self-pay | Admitting: Anesthesiology

## 2019-01-06 ENCOUNTER — Emergency Department (HOSPITAL_COMMUNITY): Payer: Self-pay

## 2019-01-06 ENCOUNTER — Inpatient Hospital Stay (HOSPITAL_COMMUNITY): Payer: Self-pay

## 2019-01-06 ENCOUNTER — Inpatient Hospital Stay (HOSPITAL_COMMUNITY)
Admission: EM | Disposition: A | Discharge status: Home / Self Care | Payer: Self-pay | Source: Ambulatory Visit | Attending: Neurosurgery

## 2019-01-06 DIAGNOSIS — G062 Extradural and subdural abscess, unspecified: Principal | ICD-10-CM

## 2019-01-06 DIAGNOSIS — I6201 Nontraumatic acute subdural hemorrhage: Secondary | ICD-10-CM

## 2019-01-06 DIAGNOSIS — I6203 Nontraumatic chronic subdural hemorrhage: Secondary | ICD-10-CM

## 2019-01-06 HISTORY — PX: CRANIOTOMY: SHX93

## 2019-01-06 LAB — GLUCOSE, CAPILLARY
Glucose-Capillary: 63 mg/dL — ABNORMAL LOW (ref 70–99)
Glucose-Capillary: 74 mg/dL (ref 70–99)
Glucose-Capillary: 45 mg/dL — ABNORMAL LOW (ref 70–99)
Glucose-Capillary: 70 mg/dL (ref 70–99)
Glucose-Capillary: 36 mg/dL — CL (ref 70–99)
Glucose-Capillary: 67 mg/dL — ABNORMAL LOW (ref 70–99)
Glucose-Capillary: 85 mg/dL (ref 70–99)
Glucose-Capillary: 57 mg/dL — ABNORMAL LOW (ref 70–99)
Glucose-Capillary: 66 mg/dL — ABNORMAL LOW (ref 70–99)
Glucose-Capillary: 96 mg/dL (ref 70–99)
Glucose-Capillary: 97 mg/dL (ref 70–99)

## 2019-01-06 LAB — CBC
HCT: 30.7 % — ABNORMAL LOW (ref 39.0–52.0)
Hemoglobin: 10.5 g/dL — ABNORMAL LOW (ref 13.0–17.0)
MCH: 28.4 pg (ref 26.0–34.0)
MCHC: 34.2 g/dL (ref 30.0–36.0)
MCV: 83 fL (ref 80.0–100.0)
Platelets: 1053 10*3/uL (ref 150–400)
RBC: 3.7 MIL/uL — ABNORMAL LOW (ref 4.22–5.81)
RDW: 13.3 % (ref 11.5–15.5)
WBC: 9.4 10*3/uL (ref 4.0–10.5)
nRBC: 0 % (ref 0.0–0.2)

## 2019-01-06 LAB — BLOOD GAS, ARTERIAL
Acid-Base Excess: 3.6 mmol/L — ABNORMAL HIGH (ref 0.0–2.0)
Acid-Base Excess: 3.4 mmol/L — ABNORMAL HIGH (ref 0.0–2.0)
Bicarbonate: 26.8 mmol/L (ref 20.0–28.0)
Bicarbonate: 25.5 mmol/L (ref 20.0–28.0)
Drawn by: 225631
Drawn by: 331761
FIO2: 100
FIO2: 60
MECHVT: 0.65 mL
MECHVT: 620 mL
O2 Saturation: 99.2 %
O2 Saturation: 98.2 %
PEEP: 5 cmH2O
PEEP: 5 cmH2O
Patient temperature: 97.8
Patient temperature: 98.6
RATE: 18 resp/min
RATE: 18 resp/min
pCO2 arterial: 35.9 mmHg (ref 32.0–48.0)
pCO2 arterial: 26.6 mmHg — ABNORMAL LOW (ref 32.0–48.0)
pH, Arterial: 7.483 — ABNORMAL HIGH (ref 7.350–7.450)
pH, Arterial: 7.588 — ABNORMAL HIGH (ref 7.350–7.450)
pO2, Arterial: 202 mmHg — ABNORMAL HIGH (ref 83.0–108.0)
pO2, Arterial: 258 mmHg — ABNORMAL HIGH (ref 83.0–108.0)

## 2019-01-06 LAB — HEPATIC FUNCTION PANEL
ALT: 38 U/L (ref 0–44)
AST: 35 U/L (ref 15–41)
Albumin: 2.7 g/dL — ABNORMAL LOW (ref 3.5–5.0)
Alkaline Phosphatase: 193 U/L — ABNORMAL HIGH (ref 38–126)
Bilirubin, Direct: 0.1 mg/dL (ref 0.0–0.2)
Indirect Bilirubin: 0.1 mg/dL — ABNORMAL LOW (ref 0.3–0.9)
Total Bilirubin: 0.2 mg/dL — ABNORMAL LOW (ref 0.3–1.2)
Total Protein: 7.4 g/dL (ref 6.5–8.1)

## 2019-01-06 LAB — BASIC METABOLIC PANEL
Anion gap: 12 (ref 5–15)
Anion gap: 12 (ref 5–15)
BUN: <5 mg/dL — ABNORMAL LOW (ref 6–20)
BUN: <5 mg/dL — ABNORMAL LOW (ref 6–20)
CO2: 23 mmol/L (ref 22–32)
CO2: 24 mmol/L (ref 22–32)
Calcium: 8.1 mg/dL — ABNORMAL LOW (ref 8.9–10.3)
Calcium: 8.5 mg/dL — ABNORMAL LOW (ref 8.9–10.3)
Chloride: 104 mmol/L (ref 98–111)
Chloride: 104 mmol/L (ref 98–111)
Creatinine, Ser: 0.54 mg/dL — ABNORMAL LOW (ref 0.61–1.24)
Creatinine, Ser: 0.58 mg/dL — ABNORMAL LOW (ref 0.61–1.24)
GFR calc Af Amer: >60 mL/min (ref >60)
GFR calc Af Amer: >60 mL/min (ref >60)
GFR calc non Af Amer: >60 mL/min (ref >60)
GFR calc non Af Amer: >60 mL/min (ref >60)
Glucose, Bld: 119 mg/dL — ABNORMAL HIGH (ref 70–99)
Glucose, Bld: 73 mg/dL (ref 70–99)
Potassium: 2.8 mmol/L — ABNORMAL LOW (ref 3.5–5.1)
Potassium: 3.7 mmol/L (ref 3.5–5.1)
Sodium: 139 mmol/L (ref 135–145)
Sodium: 140 mmol/L (ref 135–145)

## 2019-01-06 LAB — CBC WITH DIFFERENTIAL/PLATELET
Abs Immature Granulocytes: 0.08 10*3/uL — ABNORMAL HIGH (ref 0.00–0.07)
Basophils Absolute: 0.1 10*3/uL (ref 0.0–0.1)
Basophils Relative: 1 %
Eosinophils Absolute: 0.1 10*3/uL (ref 0.0–0.5)
Eosinophils Relative: 1 %
HCT: 36.2 % — ABNORMAL LOW (ref 39.0–52.0)
Hemoglobin: 11.3 g/dL — ABNORMAL LOW (ref 13.0–17.0)
Immature Granulocytes: 1 %
Lymphocytes Relative: 16 %
Lymphs Abs: 1.6 10*3/uL (ref 0.7–4.0)
MCH: 28.4 pg (ref 26.0–34.0)
MCHC: 31.2 g/dL (ref 30.0–36.0)
MCV: 91 fL (ref 80.0–100.0)
Monocytes Absolute: 0.5 10*3/uL (ref 0.1–1.0)
Monocytes Relative: 6 %
Neutro Abs: 7.2 10*3/uL (ref 1.7–7.7)
Neutrophils Relative %: 75 %
Platelets: 1312 10*3/uL (ref 150–400)
RBC: 3.98 MIL/uL — ABNORMAL LOW (ref 4.22–5.81)
RDW: 13.6 % (ref 11.5–15.5)
WBC: 9.5 10*3/uL (ref 4.0–10.5)
nRBC: 0 % (ref 0.0–0.2)

## 2019-01-06 LAB — TYPE AND SCREEN
ABO/RH(D): O POS
Antibody Screen: NEGATIVE

## 2019-01-06 LAB — RAPID URINE DRUG SCREEN, HOSP PERFORMED
Amphetamines: NOT DETECTED
Barbiturates: NOT DETECTED
Benzodiazepines: POSITIVE — AB
Cocaine: NOT DETECTED
Opiates: NOT DETECTED
Tetrahydrocannabinol: NOT DETECTED

## 2019-01-06 LAB — PROTIME-INR
INR: 1.2 (ref 0.8–1.2)
Prothrombin Time: 15.3 seconds — ABNORMAL HIGH (ref 11.4–15.2)

## 2019-01-06 LAB — MRSA PCR SCREENING: MRSA by PCR: NEGATIVE

## 2019-01-06 LAB — SARS CORONAVIRUS 2 BY RT PCR (HOSPITAL ORDER, PERFORMED IN ~~LOC~~ HOSPITAL LAB): SARS Coronavirus 2: NEGATIVE

## 2019-01-06 LAB — CBG MONITORING, ED
Glucose-Capillary: 111 mg/dL — ABNORMAL HIGH (ref 70–99)
Glucose-Capillary: 79 mg/dL (ref 70–99)

## 2019-01-06 LAB — APTT: aPTT: 35 seconds (ref 24–36)

## 2019-01-06 LAB — LACTIC ACID, PLASMA: Lactic Acid, Venous: 2.5 mmol/L (ref 0.5–1.9)

## 2019-01-06 SURGERY — CRANIOTOMY HEMATOMA EVACUATION SUBDURAL
Anesthesia: General | Site: Head | Laterality: Left

## 2019-01-06 MED ORDER — THIAMINE HCL 100 MG/ML IJ SOLN
100.0000 mg | Freq: Every day | INTRAMUSCULAR | Status: DC
  Administered 2019-01-06 – 2019-01-11 (×6): 100 mg via INTRAVENOUS
  Filled 2019-01-06 (×6): qty 2

## 2019-01-06 MED ORDER — PANTOPRAZOLE SODIUM 40 MG IV SOLR
40.0000 mg | Freq: Every day | INTRAVENOUS | Status: DC
  Administered 2019-01-06 – 2019-01-10 (×5): 40 mg via INTRAVENOUS
  Filled 2019-01-06 (×6): qty 40

## 2019-01-06 MED ORDER — SODIUM CHLORIDE 0.9 % IV BOLUS
1000.0000 mL | Freq: Once | INTRAVENOUS | Status: AC
  Administered 2019-01-06: 04:00:00 1000 mL via INTRAVENOUS

## 2019-01-06 MED ORDER — LIDOCAINE HCL (CARDIAC) PF 100 MG/5ML IV SOSY
100.0000 mg | PREFILLED_SYRINGE | Freq: Once | INTRAVENOUS | Status: AC
  Administered 2019-01-06: 04:00:00 100 mg via INTRAVENOUS
  Filled 2019-01-06: qty 5

## 2019-01-06 MED ORDER — THROMBIN 5000 UNITS EX SOLR
OROMUCOSAL | Status: DC | PRN
  Administered 2019-01-06: 5 mL via TOPICAL

## 2019-01-06 MED ORDER — ADULT MULTIVITAMIN W/MINERALS CH
1.0000 | ORAL_TABLET | Freq: Every day | ORAL | Status: DC
  Filled 2019-01-06: qty 1

## 2019-01-06 MED ORDER — ONDANSETRON HCL 4 MG/2ML IJ SOLN
INTRAMUSCULAR | Status: DC | PRN
  Administered 2019-01-06: 4 mg via INTRAVENOUS

## 2019-01-06 MED ORDER — SODIUM CHLORIDE 0.9 % IV SOLN
2.0000 g | Freq: Two times a day (BID) | INTRAVENOUS | Status: DC
  Administered 2019-01-06 – 2019-01-07 (×3): 2 g via INTRAVENOUS
  Filled 2019-01-06: qty 2
  Filled 2019-01-06: qty 20
  Filled 2019-01-06 (×2): qty 2

## 2019-01-06 MED ORDER — VANCOMYCIN HCL IN DEXTROSE 1-5 GM/200ML-% IV SOLN
INTRAVENOUS | Status: AC
  Filled 2019-01-06: qty 200

## 2019-01-06 MED ORDER — LEVETIRACETAM 500 MG PO TABS: 500.0000 mg | ORAL_TABLET | Freq: Two times a day (BID) | ORAL | Status: DC

## 2019-01-06 MED ORDER — HYDROMORPHONE HCL 1 MG/ML IJ SOLN
0.5000 mg | INTRAMUSCULAR | Status: DC | PRN
  Administered 2019-01-08 – 2019-01-11 (×13): 1 mg via INTRAVENOUS
  Administered 2019-01-11: 10:00:00 0.5 mg via INTRAVENOUS
  Administered 2019-01-11 – 2019-01-13 (×5): 1 mg via INTRAVENOUS
  Filled 2019-01-06 (×17): qty 1
  Filled 2019-01-06: qty 0.5
  Filled 2019-01-06: qty 1

## 2019-01-06 MED ORDER — ONDANSETRON HCL 4 MG/2ML IJ SOLN
4.0000 mg | INTRAMUSCULAR | Status: DC | PRN
  Filled 2019-01-06: qty 2

## 2019-01-06 MED ORDER — SODIUM CHLORIDE 0.9 % IV SOLN
INTRAVENOUS | Status: DC | PRN
  Administered 2019-01-06: 06:00:00 via INTRAVENOUS

## 2019-01-06 MED ORDER — ACETAMINOPHEN 650 MG RE SUPP: 650.0000 mg | RECTAL | Status: DC | PRN

## 2019-01-06 MED ORDER — ONDANSETRON 4 MG PO TBDP: 4.0000 mg | ORAL_TABLET | ORAL | Status: DC | PRN

## 2019-01-06 MED ORDER — QUETIAPINE FUMARATE 200 MG PO TABS: 300.0000 mg | ORAL_TABLET | Freq: Every day | ORAL | Status: DC

## 2019-01-06 MED ORDER — ESCITALOPRAM OXALATE 10 MG PO TABS: 20.0000 mg | ORAL_TABLET | Freq: Every day | ORAL | Status: DC

## 2019-01-06 MED ORDER — INSULIN ASPART 100 UNIT/ML ~~LOC~~ SOLN
0.0000 [IU] | SUBCUTANEOUS | Status: DC
  Administered 2019-01-07 – 2019-01-08 (×5): 2 [IU] via SUBCUTANEOUS

## 2019-01-06 MED ORDER — FOLIC ACID 5 MG/ML IJ SOLN
1.0000 mg | Freq: Every day | INTRAMUSCULAR | Status: DC
  Administered 2019-01-06 – 2019-01-11 (×5): 1 mg via INTRAVENOUS
  Filled 2019-01-06 (×6): qty 0.2

## 2019-01-06 MED ORDER — LORAZEPAM 2 MG/ML IJ SOLN: 2.0000 mg | INTRAMUSCULAR | Status: DC | PRN

## 2019-01-06 MED ORDER — DIPHENHYDRAMINE HCL 50 MG/ML IJ SOLN
50.0000 mg | Freq: Once | INTRAMUSCULAR | Status: AC
  Administered 2019-01-06: 02:00:00 50 mg via INTRAMUSCULAR
  Filled 2019-01-06: qty 1

## 2019-01-06 MED ORDER — PANTOPRAZOLE SODIUM 40 MG IV SOLR: 40.0000 mg | Freq: Every day | INTRAVENOUS | Status: DC

## 2019-01-06 MED ORDER — MIDAZOLAM HCL 2 MG/2ML IJ SOLN
1.0000 mg | INTRAMUSCULAR | Status: DC | PRN
  Administered 2019-01-06 (×5): 2 mg via INTRAVENOUS
  Filled 2019-01-06 (×5): qty 2

## 2019-01-06 MED ORDER — PROMETHAZINE HCL 25 MG PO TABS: 12.5000 mg | ORAL_TABLET | ORAL | Status: DC | PRN

## 2019-01-06 MED ORDER — SODIUM CHLORIDE 0.9% IV SOLUTION: Freq: Once | INTRAVENOUS | Status: DC

## 2019-01-06 MED ORDER — VITAL HIGH PROTEIN PO LIQD
1000.0000 mL | ORAL | Status: DC
  Administered 2019-01-06: 19:00:00 1000 mL

## 2019-01-06 MED ORDER — CHLORHEXIDINE GLUCONATE CLOTH 2 % EX PADS
6.0000 | MEDICATED_PAD | Freq: Every day | CUTANEOUS | Status: DC
  Administered 2019-01-06 – 2019-01-13 (×8): 6 via TOPICAL

## 2019-01-06 MED ORDER — ROCURONIUM BROMIDE 100 MG/10ML IV SOLN
INTRAVENOUS | Status: DC | PRN
  Administered 2019-01-06: 100 mg via INTRAVENOUS

## 2019-01-06 MED ORDER — PIPERACILLIN-TAZOBACTAM 3.375 G IVPB: 3.3750 g | Freq: Three times a day (TID) | INTRAVENOUS | Status: DC

## 2019-01-06 MED ORDER — DEXTROSE 50 % IV SOLN
25.0000 g | INTRAVENOUS | Status: AC
  Administered 2019-01-06: 16:00:00 25 g via INTRAVENOUS

## 2019-01-06 MED ORDER — VANCOMYCIN HCL 10 G IV SOLR
1500.0000 mg | Freq: Two times a day (BID) | INTRAVENOUS | Status: DC
  Administered 2019-01-06 – 2019-01-12 (×12): 1500 mg via INTRAVENOUS
  Filled 2019-01-06 (×13): qty 1500

## 2019-01-06 MED ORDER — AMLODIPINE BESYLATE 10 MG PO TABS: 10.0000 mg | ORAL_TABLET | Freq: Every day | ORAL | Status: DC

## 2019-01-06 MED ORDER — CHLORHEXIDINE GLUCONATE 0.12% ORAL RINSE (MEDLINE KIT)
15.0000 mL | Freq: Two times a day (BID) | OROMUCOSAL | Status: DC
  Administered 2019-01-06 – 2019-01-14 (×14): 15 mL via OROMUCOSAL

## 2019-01-06 MED ORDER — NALOXONE HCL 0.4 MG/ML IJ SOLN: 0.0800 mg | INTRAMUSCULAR | Status: DC | PRN

## 2019-01-06 MED ORDER — ESCITALOPRAM OXALATE 10 MG PO TABS
20.0000 mg | ORAL_TABLET | Freq: Every day | ORAL | Status: DC
  Administered 2019-01-07 – 2019-01-11 (×5): 20 mg
  Filled 2019-01-06 (×5): qty 2

## 2019-01-06 MED ORDER — ROCURONIUM BROMIDE 50 MG/5ML IV SOLN
45.0000 mg | Freq: Once | INTRAVENOUS | Status: AC
  Administered 2019-01-06: 05:00:00 45 mg via INTRAVENOUS

## 2019-01-06 MED ORDER — CLINDAMYCIN PHOSPHATE 300 MG/50ML IV SOLN
300.0000 mg | Freq: Four times a day (QID) | INTRAVENOUS | Status: AC
  Administered 2019-01-06 – 2019-01-07 (×3): 300 mg via INTRAVENOUS
  Filled 2019-01-06 (×3): qty 50

## 2019-01-06 MED ORDER — PRO-STAT SUGAR FREE PO LIQD
30.0000 mL | Freq: Two times a day (BID) | ORAL | Status: DC
  Administered 2019-01-06 – 2019-01-11 (×10): 30 mL
  Filled 2019-01-06 (×10): qty 30

## 2019-01-06 MED ORDER — SODIUM CHLORIDE 0.9 % IV SOLN
INTRAVENOUS | Status: DC | PRN
  Administered 2019-01-06: 07:00:00 500 mL

## 2019-01-06 MED ORDER — POTASSIUM CHLORIDE 10 MEQ/100ML IV SOLN
10.0000 meq | INTRAVENOUS | Status: AC
  Administered 2019-01-06 (×3): 10 meq via INTRAVENOUS
  Filled 2019-01-06 (×3): qty 100

## 2019-01-06 MED ORDER — FENTANYL 2500MCG IN NS 250ML (10MCG/ML) PREMIX INFUSION
0.0000 ug/h | INTRAVENOUS | Status: DC
  Administered 2019-01-06: 10:00:00 25 ug/h via INTRAVENOUS
  Administered 2019-01-07: 10:00:00 100 ug/h via INTRAVENOUS
  Administered 2019-01-08: 150 ug/h via INTRAVENOUS
  Filled 2019-01-06 (×3): qty 250

## 2019-01-06 MED ORDER — INSULIN DETEMIR 100 UNIT/ML ~~LOC~~ SOLN
10.0000 [IU] | Freq: Every day | SUBCUTANEOUS | Status: DC
  Administered 2019-01-07: 22:00:00 10 [IU] via SUBCUTANEOUS
  Filled 2019-01-06 (×3): qty 0.1

## 2019-01-06 MED ORDER — LEVETIRACETAM IN NACL 1000 MG/100ML IV SOLN
1000.0000 mg | Freq: Once | INTRAVENOUS | Status: AC
  Administered 2019-01-06: 1000 mg via INTRAVENOUS
  Filled 2019-01-06: qty 100

## 2019-01-06 MED ORDER — CEFAZOLIN SODIUM-DEXTROSE 2-3 GM-%(50ML) IV SOLR
INTRAVENOUS | Status: DC | PRN
  Administered 2019-01-06: 2 g via INTRAVENOUS

## 2019-01-06 MED ORDER — LEVETIRACETAM IN NACL 500 MG/100ML IV SOLN
500.0000 mg | Freq: Two times a day (BID) | INTRAVENOUS | Status: DC
  Administered 2019-01-06 – 2019-01-11 (×11): 500 mg via INTRAVENOUS
  Filled 2019-01-06 (×11): qty 100

## 2019-01-06 MED ORDER — PIPERACILLIN-TAZOBACTAM 3.375 G IVPB 30 MIN: 3.3750 g | Freq: Four times a day (QID) | INTRAVENOUS | Status: DC

## 2019-01-06 MED ORDER — THROMBIN 20000 UNITS EX KIT
PACK | CUTANEOUS | Status: DC | PRN
  Administered 2019-01-06: 07:00:00 20 mL via TOPICAL

## 2019-01-06 MED ORDER — FENTANYL CITRATE (PF) 250 MCG/5ML IJ SOLN
INTRAMUSCULAR | Status: DC | PRN
  Administered 2019-01-06: 50 ug via INTRAVENOUS
  Administered 2019-01-06 (×3): 100 ug via INTRAVENOUS

## 2019-01-06 MED ORDER — LORAZEPAM 2 MG/ML IJ SOLN
2.0000 mg | Freq: Once | INTRAMUSCULAR | Status: AC
  Administered 2019-01-06: 2 mg via INTRAMUSCULAR
  Filled 2019-01-06: qty 1

## 2019-01-06 MED ORDER — QUETIAPINE FUMARATE 50 MG PO TABS
300.0000 mg | ORAL_TABLET | Freq: Every day | ORAL | Status: DC
  Administered 2019-01-06 – 2019-01-11 (×4): 300 mg
  Filled 2019-01-06: qty 12
  Filled 2019-01-06 (×2): qty 1
  Filled 2019-01-06: qty 6
  Filled 2019-01-06: qty 1

## 2019-01-06 MED ORDER — LORAZEPAM 1 MG PO TABS
1.0000 mg | ORAL_TABLET | Freq: Three times a day (TID) | ORAL | Status: DC | PRN
  Administered 2019-01-12 – 2019-01-13 (×2): 1 mg via ORAL
  Filled 2019-01-06 (×2): qty 1

## 2019-01-06 MED ORDER — VANCOMYCIN HCL 1000 MG IV SOLR
INTRAVENOUS | Status: DC | PRN
  Administered 2019-01-06: 1000 mg via INTRAVENOUS

## 2019-01-06 MED ORDER — ETOMIDATE 2 MG/ML IV SOLN
20.0000 mg | Freq: Once | INTRAVENOUS | Status: AC
  Administered 2019-01-06: 04:00:00 20 mg via INTRAVENOUS
  Filled 2019-01-06: qty 10

## 2019-01-06 MED ORDER — 0.9 % SODIUM CHLORIDE (POUR BTL) OPTIME
TOPICAL | Status: DC | PRN
  Administered 2019-01-06 (×2): 1000 mL

## 2019-01-06 MED ORDER — SODIUM CHLORIDE 0.9 % IV SOLN
INTRAVENOUS | Status: DC | PRN
  Administered 2019-01-06: 20 ug/min via INTRAVENOUS

## 2019-01-06 MED ORDER — DEXTROSE 50 % IV SOLN
INTRAVENOUS | Status: AC
  Filled 2019-01-06: qty 50

## 2019-01-06 MED ORDER — ADULT MULTIVITAMIN W/MINERALS CH
1.0000 | ORAL_TABLET | Freq: Every day | ORAL | Status: DC
  Administered 2019-01-07 – 2019-01-11 (×4): 1
  Filled 2019-01-06 (×5): qty 1

## 2019-01-06 MED ORDER — ACETAMINOPHEN 325 MG PO TABS
650.0000 mg | ORAL_TABLET | ORAL | Status: DC | PRN
  Administered 2019-01-10 – 2019-01-13 (×2): 650 mg via ORAL
  Filled 2019-01-06 (×2): qty 2

## 2019-01-06 MED ORDER — POTASSIUM CHLORIDE CRYS ER 20 MEQ PO TBCR
40.0000 meq | EXTENDED_RELEASE_TABLET | ORAL | Status: AC
  Administered 2019-01-06 (×2): 40 meq via ORAL
  Filled 2019-01-06 (×2): qty 2

## 2019-01-06 MED ORDER — DEXTROSE 50 % IV SOLN
25.0000 mL | Freq: Once | INTRAVENOUS | Status: AC
  Administered 2019-01-06: 20:00:00 25 mL via INTRAVENOUS

## 2019-01-06 MED ORDER — MAGNESIUM SULFATE 2 GM/50ML IV SOLN
2.0000 g | Freq: Once | INTRAVENOUS | Status: AC
  Administered 2019-01-06: 21:00:00 2 g via INTRAVENOUS
  Filled 2019-01-06: qty 50

## 2019-01-06 MED ORDER — AMLODIPINE BESYLATE 10 MG PO TABS
10.0000 mg | ORAL_TABLET | Freq: Every day | ORAL | Status: DC
  Administered 2019-01-08 – 2019-01-11 (×4): 10 mg
  Filled 2019-01-06 (×4): qty 1

## 2019-01-06 MED ORDER — ONDANSETRON HCL 4 MG PO TABS: 4.0000 mg | ORAL_TABLET | ORAL | Status: DC | PRN

## 2019-01-06 MED ORDER — ORAL CARE MOUTH RINSE
15.0000 mL | OROMUCOSAL | Status: DC
  Administered 2019-01-06 – 2019-01-08 (×17): 15 mL via OROMUCOSAL

## 2019-01-06 MED ORDER — PHENYLEPHRINE 40 MCG/ML (10ML) SYRINGE FOR IV PUSH (FOR BLOOD PRESSURE SUPPORT)
PREFILLED_SYRINGE | INTRAVENOUS | Status: DC | PRN
  Administered 2019-01-06: 120 ug via INTRAVENOUS

## 2019-01-06 MED ORDER — DEXTROSE 50 % IV SOLN
INTRAVENOUS | Status: AC
  Administered 2019-01-06: 20:00:00 25 mL via INTRAVENOUS
  Filled 2019-01-06: qty 50

## 2019-01-06 MED ORDER — INSULIN ASPART 100 UNIT/ML ~~LOC~~ SOLN: 0.0000 [IU] | SUBCUTANEOUS | Status: DC

## 2019-01-06 MED ORDER — SUCCINYLCHOLINE CHLORIDE 20 MG/ML IJ SOLN
100.0000 mg | Freq: Once | INTRAMUSCULAR | Status: AC
  Administered 2019-01-06: 100 mg via INTRAVENOUS
  Filled 2019-01-06: qty 5

## 2019-01-06 MED ORDER — LABETALOL HCL 5 MG/ML IV SOLN
10.0000 mg | INTRAVENOUS | Status: DC | PRN
  Filled 2019-01-06: qty 4

## 2019-01-06 MED ORDER — PROPOFOL 1000 MG/100ML IV EMUL
5.0000 ug/kg/min | INTRAVENOUS | Status: DC
  Administered 2019-01-06: 75 ug/kg/min via INTRAVENOUS
  Administered 2019-01-06: 21:00:00 65 ug/kg/min via INTRAVENOUS
  Administered 2019-01-06: 5 ug/kg/min via INTRAVENOUS
  Administered 2019-01-06: 17:00:00 65 ug/kg/min via INTRAVENOUS
  Administered 2019-01-07 (×2): 45 ug/kg/min via INTRAVENOUS
  Administered 2019-01-07: 04:00:00 40 ug/kg/min via INTRAVENOUS
  Administered 2019-01-07: 65 ug/kg/min via INTRAVENOUS
  Administered 2019-01-07: 18:00:00 45 ug/kg/min via INTRAVENOUS
  Administered 2019-01-07: 10:00:00 50 ug/kg/min via INTRAVENOUS
  Administered 2019-01-08: 03:00:00 45 ug/kg/min via INTRAVENOUS
  Filled 2019-01-06 (×13): qty 100

## 2019-01-06 MED ORDER — SODIUM CHLORIDE 0.9 % IV SOLN
INTRAVENOUS | Status: DC
  Administered 2019-01-06 – 2019-01-13 (×4): via INTRAVENOUS

## 2019-01-06 MED ORDER — LEVETIRACETAM IN NACL 500 MG/100ML IV SOLN: 500.0000 mg | Freq: Two times a day (BID) | INTRAVENOUS | Status: DC

## 2019-01-06 MED ORDER — DEXTROSE-NACL 5-0.9 % IV SOLN
INTRAVENOUS | Status: DC
  Administered 2019-01-06: 17:00:00 via INTRAVENOUS

## 2019-01-06 SURGICAL SUPPLY — 69 items
BAG DECANTER FOR FLEXI CONT (MISCELLANEOUS) ×3 IMPLANT
BIT DRILL WIRE PASS 1.3MM (BIT) ×1 IMPLANT
BLADE SURG 11 STRL SS (BLADE) IMPLANT
BNDG COHESIVE 4X5 TAN NS LF (GAUZE/BANDAGES/DRESSINGS) IMPLANT
BNDG STRETCH 4X75 STRL LF (GAUZE/BANDAGES/DRESSINGS) IMPLANT
BUR ACORN 6.0 PRECISION (BURR) ×2 IMPLANT
BUR ACORN 6.0MM PRECISION (BURR) ×1
BUR SPIRAL ROUTER 2.3 (BUR) ×2 IMPLANT
BUR SPIRAL ROUTER 2.3MM (BUR) ×1
CANISTER SUCT 3000ML PPV (MISCELLANEOUS) ×3 IMPLANT
CARTRIDGE OIL MAESTRO DRILL (MISCELLANEOUS) ×1 IMPLANT
CLIP VESOCCLUDE MED 6/CT (CLIP) IMPLANT
COVER BACK TABLE 60X90IN (DRAPES) ×6 IMPLANT
COVER WAND RF STERILE (DRAPES) ×3 IMPLANT
DERMABOND ADVANCED (GAUZE/BANDAGES/DRESSINGS)
DERMABOND ADVANCED .7 DNX12 (GAUZE/BANDAGES/DRESSINGS) IMPLANT
DIFFUSER DRILL AIR PNEUMATIC (MISCELLANEOUS) ×3 IMPLANT
DRAIN CHANNEL 10M FLAT 3/4 FLT (DRAIN) ×3 IMPLANT
DRAPE MICROSCOPE LEICA (MISCELLANEOUS) IMPLANT
DRAPE NEUROLOGICAL W/INCISE (DRAPES) ×3 IMPLANT
DRAPE SURG 17X23 STRL (DRAPES) IMPLANT
DRAPE WARM FLUID 44X44 (DRAPES) ×3 IMPLANT
DRILL WIRE PASS 1.3MM (BIT) ×3
ELECT CAUTERY BLADE 6.4 (BLADE) ×3 IMPLANT
ELECT REM PT RETURN 9FT ADLT (ELECTROSURGICAL) ×3
ELECTRODE REM PT RTRN 9FT ADLT (ELECTROSURGICAL) ×1 IMPLANT
EVACUATOR SILICONE 100CC (DRAIN) ×3 IMPLANT
GAUZE 4X4 16PLY RFD (DISPOSABLE) IMPLANT
GAUZE SPONGE 4X4 12PLY STRL (GAUZE/BANDAGES/DRESSINGS) ×3 IMPLANT
GLOVE BIO SURGEON STRL SZ7 (GLOVE) ×3 IMPLANT
GLOVE BIOGEL PI IND STRL 7.5 (GLOVE) ×1 IMPLANT
GLOVE BIOGEL PI INDICATOR 7.5 (GLOVE) ×2
GLOVE ECLIPSE 9.0 STRL (GLOVE) ×3 IMPLANT
GLOVE EXAM NITRILE XL STR (GLOVE) IMPLANT
GOWN STRL REUS W/ TWL LRG LVL3 (GOWN DISPOSABLE) IMPLANT
GOWN STRL REUS W/ TWL XL LVL3 (GOWN DISPOSABLE) IMPLANT
GOWN STRL REUS W/TWL 2XL LVL3 (GOWN DISPOSABLE) IMPLANT
GOWN STRL REUS W/TWL LRG LVL3 (GOWN DISPOSABLE)
GOWN STRL REUS W/TWL XL LVL3 (GOWN DISPOSABLE)
HEMOSTAT SURGICEL 2X14 (HEMOSTASIS) IMPLANT
KIT BASIN OR (CUSTOM PROCEDURE TRAY) ×3 IMPLANT
KIT TURNOVER KIT B (KITS) ×3 IMPLANT
NEEDLE HYPO 25X1 1.5 SAFETY (NEEDLE) ×3 IMPLANT
NS IRRIG 1000ML POUR BTL (IV SOLUTION) ×3 IMPLANT
OIL CARTRIDGE MAESTRO DRILL (MISCELLANEOUS) ×3
PACK CRANIOTOMY CUSTOM (CUSTOM PROCEDURE TRAY) ×3 IMPLANT
PAD ARMBOARD 7.5X6 YLW CONV (MISCELLANEOUS) ×3 IMPLANT
PATTIES SURGICAL .25X.25 (GAUZE/BANDAGES/DRESSINGS) IMPLANT
PATTIES SURGICAL .5 X.5 (GAUZE/BANDAGES/DRESSINGS) IMPLANT
PATTIES SURGICAL .5 X3 (DISPOSABLE) IMPLANT
PATTIES SURGICAL 1X1 (DISPOSABLE) IMPLANT
PIN MAYFIELD SKULL DISP (PIN) IMPLANT
SPECIMEN JAR SMALL (MISCELLANEOUS) IMPLANT
SPONGE LAP 18X18 RF (DISPOSABLE) IMPLANT
SPONGE NEURO XRAY DETECT 1X3 (DISPOSABLE) IMPLANT
SPONGE SURGIFOAM ABS GEL 100 (HEMOSTASIS) ×3 IMPLANT
STAPLER VISISTAT 35W (STAPLE) ×3 IMPLANT
STOCKINETTE 6  STRL (DRAPES) ×2
STOCKINETTE 6 STRL (DRAPES) ×1 IMPLANT
STOCKINETTE TUBULAR 6 INCH (GAUZE/BANDAGES/DRESSINGS) ×3 IMPLANT
SUT NURALON 4 0 TR CR/8 (SUTURE) ×6 IMPLANT
SUT VIC AB 2-0 CT2 18 VCP726D (SUTURE) ×6 IMPLANT
SYR CONTROL 10ML LL (SYRINGE) ×3 IMPLANT
TAPE CLOTH SURG 4X10 WHT LF (GAUZE/BANDAGES/DRESSINGS) ×3 IMPLANT
TOWEL GREEN STERILE (TOWEL DISPOSABLE) ×3 IMPLANT
TOWEL GREEN STERILE FF (TOWEL DISPOSABLE) ×3 IMPLANT
TRAY FOLEY MTR SLVR 16FR STAT (SET/KITS/TRAYS/PACK) IMPLANT
UNDERPAD 30X30 (UNDERPADS AND DIAPERS) IMPLANT
WATER STERILE IRR 1000ML POUR (IV SOLUTION) ×3 IMPLANT

## 2019-01-06 NOTE — ED Notes (Signed)
Pt very agitated and attempting to get out of bed. Pt repositioned and safety sitter still at bedside.

## 2019-01-06 NOTE — ED Notes (Signed)
Pt still fighting staff to get out of bed. Staff assisting to keep pt in bed so that pt does not hurt himself or other staff members.

## 2019-01-06 NOTE — ED Notes (Signed)
Pt pulling at monitors and attempting to get out of bed. Pt agitated at this time. EDP made aware.

## 2019-01-06 NOTE — ED Provider Notes (Signed)
.  Sedation  Date/Time: 01/06/2019 2:42 AM Performed by: Favio Moder, Willie [name] Authorized by: Shelli Portilla, Willie [name]   Consent:    Consent obtained:  Emergent situation (patient not competent to give consent) Universal protocol:    Procedure explained and questions answered to patient or proxy's satisfaction: no     Relevant documents present and verified: yes     Required blood products, implants, devices, and special equipment available: yes     Immediately prior to procedure a time out was called: yes     Patient identity confirmation method:  Arm band and hospital-assigned identification number Indications:    Procedure performed:  Imaging studies   Procedure necessitating sedation performed by:  Different physician Pre-sedation assessment:    Time since last food or drink:  Unknown   NPO status caution: unable to specify NPO status     ASA classification: class 3 - patient with severe systemic disease     Neck mobility: normal     Mouth opening:  3 or more finger widths   Mallampati score:  II - soft palate, uvula, fauces visible   Pre-sedation assessments completed and reviewed: airway patency, cardiovascular function, hydration status, mental status, nausea/vomiting, respiratory function and temperature     Pre-sedation assessment completed:  01/06/2019 2:44 AM Immediate pre-procedure details:    Reassessment: Patient reassessed immediately prior to procedure     Reviewed: vital signs     Verified: bag valve mask available, emergency equipment available, intubation equipment available, IV patency confirmed, oxygen available and suction available   Procedure details (see MAR for exact dosages):    Sedation:  Midazolam   Analgesia:  None   Intra-procedure monitoring:  Blood pressure monitoring, cardiac monitor, continuous pulse oximetry, frequent LOC assessments and frequent vital sign checks   Intra-procedure events: none     Total Provider sedation time (minutes):  45 Post-procedure  details:    Post-sedation assessment completed:  01/06/2019 4:21 AM Comments:     Patient was sent dated as noted above to obtain a CT scan of the brain.  CT scan showed significant intracranial bleed or fluid collection with midline shift.  Patient was brought back from the CT scanner and intubated.  See separate note. Procedure Name: Intubation Date/Time: 01/06/2019 4:22 AM Performed by: Rigdon Macomber, Willie [name] Pre-anesthesia Checklist: Patient identified, Emergency Drugs available, Suction available, Patient being monitored and Timeout performed Oxygen Delivery Method: Ambu bag Preoxygenation: Pre-oxygenation with 100% oxygen Induction Type: Rapid sequence Ventilation: Mask ventilation without difficulty Laryngoscope Size: Glidescope and 3 Grade View: Grade II Tube size: 7.5 mm Number of attempts: 1 Placement Confirmation: ETT inserted through vocal cords under direct vision,  Positive ETCO2 and Breath sounds checked- equal and bilateral Secured at: 26 cm Tube secured with: ETT holder Dental Injury: Teeth and Oropharynx as per pre-operative assessment          Willie Maguire, Willie [name] 01/06/19 [id number]

## 2019-01-06 NOTE — Progress Notes (Signed)
Pharmacy Antibiotic Note  Willie [name] is a 43 y.o. male admitted on 01/05/2019 with infected subdural space s/p subdural hematoma evacuation and bilateral craniotomies on 7/28. Wound cultures (9/9) pending. Vancomycin 1g given pre-procedure. Renal function WNL. Pharmacy has been consulted for Vancomycin dosing.   Plan: Vancomycin 1500 mg IV every 12 hours.  Goal trough 15-20 mcg/mL. (AUC 488)  Weight: 165 lb (74.8 kg)  Temp (24hrs), Avg:97.9 F (36.6 C), Min:97 F (36.1 C), Max:99.5 F (37.5 C)  Recent Labs  Lab 01/06/19 0002 01/06/19 0441  WBC 9.5  --   CREATININE 0.58*  --   LATICACIDVEN  --  2.5*    Estimated Creatinine Clearance: 127.3 mL/min (A) (by C-G formula based on SCr of 0.58 mg/dL (L)).    Allergies  Allergen Reactions  . Invokamet [Canagliflozin-Metformin Hcl] Other (See Comments)    Lactic acidosis  . Metformin And Related Other (See Comments)    DRASTIC drop in blood sugar    Antimicrobials this admission: 9/9 Zosyn 3.375g IV every 8 hours 9/9 Vancomycin 1g IV once pre-procedure  Microbiology results: 9/9 WoundCx: Pending 9/9 MRSA PCR: Pending  Thank you for allowing pharmacy to be a part of this patient's care.  [facility] 01/06/2019 12:47 PM

## 2019-01-06 NOTE — Transfer of Care (Signed)
Immediate Anesthesia Transfer of Care Note  Patient: Willie [name]  Procedure(s) Performed: CRANIECTOMY (Left Head)  Patient Location: PACU  Anesthesia Type:General  Level of Consciousness: sedated and Patient remains intubated per anesthesia plan  Airway & Oxygen Therapy: Patient Spontanous Breathing and Patient placed on Ventilator (see vital sign flow sheet for setting)  Post-op Assessment: Report given to RN and Post -op Vital signs reviewed and stable  Post vital signs: Reviewed and stable  Last Vitals:  Vitals Value Taken Time  BP 110/77 01/06/19 0749  Temp    Pulse 76 01/06/19 0753  Resp 18 01/06/19 0753  SpO2 100 % 01/06/19 0753  Vitals shown include unvalidated device data.  Last Pain:  Vitals:   01/05/19 2234  TempSrc: Oral         Complications: No apparent anesthesia complications

## 2019-01-06 NOTE — ED Notes (Addendum)
Pt confused, pulling off all monitors, and attempting to get out of bed. ED provider made aware.

## 2019-01-06 NOTE — Progress Notes (Signed)
Pt is getting a room on [facility]N at some point this AM - will get patient hooked up to LTM EEG once pt is in room.

## 2019-01-06 NOTE — Anesthesia Preprocedure Evaluation (Signed)
Anesthesia Evaluation  Patient identified by MRN, date of birth, ID band Patient unresponsive  Preop documentation limited or incomplete due to emergent nature of procedure.  Airway Mallampati: Intubated       Dental   Pulmonary former smoker,    breath sounds clear to auscultation       Cardiovascular hypertension,  Rate:Tachycardia     Neuro/Psych PSYCHIATRIC DISORDERS Anxiety Depression Recurrent subdural CVA    GI/Hepatic GERD  ,(+) Hepatitis -  Endo/Other  diabetes  Renal/GU      Musculoskeletal   Abdominal   Peds  Hematology  (+) anemia ,   Anesthesia Other Findings   Reproductive/Obstetrics                             Anesthesia Physical Anesthesia Plan  ASA: III and emergent  Anesthesia Plan: General   Post-op Pain Management:    Induction: Inhalational  PONV Risk Score and Plan: 2 and Treatment may vary due to age or medical condition  Airway Management Planned: Oral ETT  Additional Equipment: Arterial line  Intra-op Plan:   Post-operative Plan: Post-operative intubation/ventilation  Informed Consent: I have reviewed the patients History and Physical, chart, labs and discussed the procedure including the risks, benefits and alternatives for the proposed anesthesia with the patient or authorized representative who has indicated his/her understanding and acceptance.     Dental advisory given  Plan Discussed with: CRNA and Surgeon  Anesthesia Plan Comments:         Anesthesia Quick Evaluation

## 2019-01-06 NOTE — ED Notes (Signed)
Pt moved to Res A to be closer to nurses station so that he could be watched closer.

## 2019-01-06 NOTE — ED Notes (Signed)
Writer continually assisting pt in staying in the bed and keeping him from injuring himself or anyone else.

## 2019-01-06 NOTE — Op Note (Signed)
Date of procedure: 01/06/2019  Date of dictation: Same  Service: Neurosurgery  Preoperative diagnosis: Recurrent left subdural hematoma  Postoperative diagnosis: Left postoperative subdural abscess  Procedure Name: Reexploration of left frontal craniotomy with evacuation of subdural abscess, placement of subdural drain  Surgeon:Maki Hege A.Sankalp Ferrell, M.D.  Asst. Surgeon: Reinaldo Meeker, NP  Anesthesia: General  Indication: 43 year old male status post bilateral craniotomies approximately 3 weeks ago.  Patient presented with recurrent left-sided subdural hematoma and underwent reexploration of his craniotomy with placement of subdural drain approximately 10 days ago.  Patient presents now with increasing confusion and agitation.  No history of fever or wound drainage.  CT scan demonstrates evidence of a large recurrent left frontal fluid collection under pressure.  Operative note: After induction of anesthesia, patient position supine with his head turned toward the right.  Patient's left scalp was prepped and draped sterilely.  Incision was reopened using a 10 blade.  Retractor was placed.  Previous craniotomy site was dissected free.  Bone flap was disassembled from the plate and removed.  At this point of being came clear that there was gross purulence pouring from the subdural space.  I incised the dura widely to the limits of the craniotomy site.  I irrigated the subdural space clean pus after taking cultures.  I did not find any evidence of active hemorrhage or bleeding.  There is a tough rind overlying the cortex.  I made a small opening in this membrane and further purulence was allowed to drain.  I did not aggressively stripped this membrane off the underlying brain for fear of severe cortical irritation.  I placed a 10 mm flat Blake drain in the subdural space and exited through a separate incision.  I irrigated the wound one final time and then closed the temporalis and skin using Vicryl sutures and used  staples of the surface.  There were no apparent complications.  The patient was given intraoperative antibiotics.  He returns to the recovery room in critical condition.

## 2019-01-06 NOTE — ED Notes (Signed)
Called mother at [phone] and no answer.

## 2019-01-06 NOTE — H&P (Signed)
Willie [name] is an 43 y.o. male.   Chief Complaint: Subdural hematoma HPI: 43 year old male status post prior bilateral craniotomies for subdural by Dr. Vertell Limber.  Patient with recurrent left-sided subdural hematoma status post drain placement and secondary evacuation by Dr. Saintclair Halsted little over 10 days ago.  The patient now presents with worsening confusion and agitation.  CT scan done at [facility] long emergency room demonstrates a large left frontal subdural hematoma which appears to be subacute in nature.  There is marked mass-effect with significant left to right shift.  There is no evidence of obvious acute hemorrhage.  The patient has no history of fever or other signs of infection.  No documented seizure activity.  Past Medical History:  Diagnosis Date  . Alcohol abuse   . Alcoholic pancreatitis    recurrent  . Depression   . Diabetes mellitus, type II ([place])    New Onset 03/2010  . GERD (gastroesophageal reflux disease)   . High cholesterol   . 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 Hypertension   . Sleep apnea    does not wear a CPAP    Past Surgical History:  Procedure Laterality Date  . COLONOSCOPY W/ BIOPSIES  09/2010   Dr. Ardis Hughs.  for intermittent rectal bleeding.  Mild sigmoid to descending diverticulosis.  Mild left colon erythema, benign biopsy, probably "prep effect"  . COLONOSCOPY WITH PROPOFOL N/A 07/06/2017   Procedure: COLONOSCOPY WITH PROPOFOL;  Surgeon: Jerene Bears, MD;  Location: [facility];  Service: Gastroenterology;  Laterality: N/A;  . CRANIOTOMY Bilateral 11/24/2018   Procedure: Bilateral craniotomy for evacuation of subdural hematoma;  Surgeon: Erline Levine, MD;  Location: [place];  Service: Neurosurgery;  Laterality: Bilateral;  Bilateral craniotomy for evacuation of subdural hematoma  . LUMBAR MICRODISCECTOMY  ~ 2004   Dr Ellene Route    Family History  Problem Relation Age of Onset  . Diabetes Mother   .  Lung cancer Brother        twin brother  . Pancreatic cancer Paternal Aunt   . Colon cancer Neg Hx   . Stomach cancer Neg Hx    Social History:  reports that he has quit smoking. His smoking use included cigarettes. He started smoking about 2 years ago. He has a 0.25 pack-year smoking history. He has never used smokeless tobacco. He reports current alcohol use. He reports previous drug use.  Allergies:  Allergies  Allergen Reactions  . Invokamet [Canagliflozin-Metformin Hcl] Other (See Comments)    Lactic acidosis  . Metformin And Related Other (See Comments)    DRASTIC drop in blood sugar    (Not in a hospital admission)   Results for orders placed or performed during the hospital encounter of 01/05/19 (from the past 48 hour(s))  CBG monitoring, ED     Status: Abnormal   Collection Time: 01/05/19 11:59 PM  Result Value Ref Range   Glucose-Capillary 111 (H) 70 - 99 mg/dL  CBC with Differential     Status: Abnormal   Collection Time: 01/06/19 12:02 AM  Result Value Ref Range   WBC 9.5 4.0 - 10.5 K/uL   RBC 3.98 (L) 4.22 - 5.81 MIL/uL   Hemoglobin 11.3 (L) 13.0 - 17.0 g/dL   HCT 36.2 (L) 39.0 - 52.0 %   MCV 91.0 80.0 - 100.0 fL   MCH 28.4 26.0 - 34.0 pg   MCHC 31.2 30.0 - 36.0 g/dL   RDW  13.6 11.5 - 15.5 %   Platelets 1,312 (HH) 150 - 400 K/uL    Comment: REPEATED TO VERIFY THIS CRITICAL RESULT HAS VERIFIED AND BEEN CALLED TO RN C RYANS BY ALEXIS [place] ON 09 09 2020 AT 0047, AND HAS BEEN READ BACK.     nRBC 0.0 0.0 - 0.2 %   Neutrophils Relative % 75 %   Neutro Abs 7.2 1.7 - 7.7 K/uL   Lymphocytes Relative 16 %   Lymphs Abs 1.6 0.7 - 4.0 K/uL   Monocytes Relative 6 %   Monocytes Absolute 0.5 0.1 - 1.0 K/uL   Eosinophils Relative 1 %   Eosinophils Absolute 0.1 0.0 - 0.5 K/uL   Basophils Relative 1 %   Basophils Absolute 0.1 0.0 - 0.1 K/uL   Immature Granulocytes 1 %   Abs Immature Granulocytes 0.08 (H) 0.00 - 0.07 K/uL    Comment: Performed at [facility], [place] [address], [place], [place] [id number]  Basic metabolic panel     Status: Abnormal   Collection Time: 01/06/19 12:02 AM  Result Value Ref Range   Sodium 140 135 - 145 mmol/L   Potassium 3.7 3.5 - 5.1 mmol/L   Chloride 104 98 - 111 mmol/L   CO2 24 22 - 32 mmol/L   Glucose, Bld 119 (H) 70 - 99 mg/dL   BUN <5 (L) 6 - 20 mg/dL   Creatinine, Ser 0.58 (L) 0.61 - 1.24 mg/dL   Calcium 8.5 (L) 8.9 - 10.3 mg/dL   GFR calc non Af Amer >60 >60 mL/min   GFR calc Af Amer >60 >60 mL/min   Anion gap 12 5 - 15    Comment: Performed at [facility], [place] [address], [place], [place] [address]  Rapid urine drug screen (hospital performed)     Status: Abnormal   Collection Time: 01/06/19  3:23 AM  Result Value Ref Range   Opiates NONE DETECTED NONE DETECTED   Cocaine NONE DETECTED NONE DETECTED   Benzodiazepines POSITIVE (A) NONE DETECTED   Amphetamines NONE DETECTED NONE DETECTED   Tetrahydrocannabinol NONE DETECTED NONE DETECTED   Barbiturates NONE DETECTED NONE DETECTED    Comment: (NOTE) DRUG SCREEN FOR MEDICAL PURPOSES ONLY.  IF CONFIRMATION IS NEEDED FOR ANY PURPOSE, NOTIFY LAB WITHIN 5 DAYS. LOWEST DETECTABLE LIMITS FOR URINE DRUG SCREEN Drug Class                     Cutoff (ng/mL) Amphetamine and metabolites    1000 Barbiturate and metabolites    200 Benzodiazepine                 [id number] Tricyclics and metabolites     300 Opiates and metabolites        300 Cocaine and metabolites        300 THC                            50 Performed at [facility], [place] [address], [name], [place] [address]   Hepatic function panel     Status: Abnormal   Collection Time: 01/06/19  4:01 AM  Result Value Ref Range   Total Protein 7.4 6.5 - 8.1 g/dL   Albumin 2.7 (L) 3.5 - 5.0 g/dL   AST 35 15 - 41 U/L   ALT 38 0 - 44 U/L   Alkaline Phosphatase 193 (H) 38 - 126 U/L  Total Bilirubin 0.2 (L) 0.3 - 1.2 mg/dL   Bilirubin, Direct 0.1 0.0 -  0.2 mg/dL   Indirect Bilirubin 0.1 (L) 0.3 - 0.9 mg/dL    Comment: Performed at [facility], [place] [address], [place], [place] [address]  APTT     Status: None   Collection Time: 01/06/19  4:01 AM  Result Value Ref Range   aPTT 35 24 - 36 seconds    Comment: Performed at [facility], [place] [address], [place], [place] [address]  Protime-INR     Status: Abnormal   Collection Time: 01/06/19  4:01 AM  Result Value Ref Range   Prothrombin Time 15.3 (H) 11.4 - 15.2 seconds   INR 1.2 0.8 - 1.2    Comment: (NOTE) INR goal varies based on device and disease states. Performed at [facility], [place] [address] [place], [place] [address]   Lactic acid, plasma     Status: Abnormal   Collection Time: 01/06/19  4:41 AM  Result Value Ref Range   Lactic Acid, Venous 2.5 (HH) 0.5 - 1.9 mmol/L    Comment: CRITICAL RESULT CALLED TO, READ BACK BY AND VERIFIED WITH: OXENDINE,J @ 0522 ON [id number] BY POTEAT,S Performed at [place] [address], [place], [place] [address]   CBG monitoring, ED     Status: None   Collection Time: 01/06/19  4:43 AM  Result Value Ref Range   Glucose-Capillary 79 70 - 99 mg/dL   Dg Abdomen 1 View  Result Date: 01/06/2019 CLINICAL DATA:  Orogastric tube placement EXAM: ABDOMEN - 1 VIEW COMPARISON:  Abdominal CT 10/13/2017 FINDINGS: The orogastric tube tip is at the proximal to mid stomach. Upper abdominal bowel gas pattern is normal. No abnormal mass effect. IMPRESSION: Orogastric tube in good position with tip at the stomach. Electronically Signed   By: Monte Fantasia M.D.   On: 01/06/2019 05:22   Ct Head Wo Contrast  Result Date: 01/06/2019 CLINICAL DATA:  Altered level consciousness, history of TBI and subdural EXAM: CT HEAD WITHOUT CONTRAST TECHNIQUE: Contiguous axial images were obtained from the base of the skull through the vertex without intravenous contrast. COMPARISON:  Multiple priors,  most recent December 22, 2018 FINDINGS: Brain: There is interval significant worsening in the extra-axial hypodense collection overlying the left frontotemporal lobe measuring 3.4 cm in greatest diameter. There is a probable thickened dura/flap seen overlying the complex collection. The collection causes midline shift measuring 11 mm from left to right. No downward herniation however is noted. Vascular: No hyperdense vessel or unexpected calcification. Skull: Bilateral craniotomy defects are seen overlying the temporal regions. Sinuses/Orbits: The visualized paranasal sinuses and mastoid air cells are clear. The orbits and globes intact. Other: None IMPRESSION: Interval significant worsening in the complex extra-axial collection overlying the left frontotemporal lobe measuring 3.4 cm in greatest diameter with 11 mm of left to rightward shift. Would recommend MRI for further evaluation. These results were called by telephone at the time of interpretation on 01/06/2019 at 3:59 am to provider [facility] , who verbally acknowledged these results. Electronically Signed   By: Prudencio Pair M.D.   On: 01/06/2019 04:03   Dg Chest Portable 1 View  Result Date: 01/06/2019 CLINICAL DATA:  Intubation EXAM: PORTABLE CHEST 1 VIEW COMPARISON:  07/29/2017 FINDINGS: Endotracheal tube tip between the clavicular heads and carina. The orogastric tube at least reaches the stomach. There is no edema, consolidation, effusion, or pneumothorax. Normal heart size and mediastinal  contours. IMPRESSION: Unremarkable hardware positioning. No evidence of acute cardiopulmonary disease. Electronically Signed   By: Monte Fantasia M.D.   On: 01/06/2019 05:22    Pertinent items noted in HPI and remainder of comprehensive ROS otherwise negative.  Blood pressure (!) 138/94, pulse (!) 101, temperature 97.8 F (36.6 C), temperature source Oral, resp. rate 20, weight 74.8 kg, SpO2 100 %.  Patient is intubated.  He is sedated.  He is arousable.   He moves all extremities.  He does not appear toxic.  Examination of his head demonstrates prior surgery bilaterally.  No obvious infection.  Examination of his oropharynx nasopharynx and external auditory canals are clear.  Chest and abdomen are benign.  Extremities are free from injury or deformity. Assessment/Plan Large recurrent left frontal subdural hematoma with marked mass-effect.  I have discussed situation with the family.  Recommended we move forward emergently with re-expiration of his craniotomy and evacuation of subdural hematoma.  Risks and benefits of been explained.  Patient's family wished to proceed.  Mallie Mussel A Abbagail Scaff 01/06/2019, 5:27 AM

## 2019-01-06 NOTE — ED Notes (Signed)
Safety sitter at bedside due to patients confusion

## 2019-01-06 NOTE — ED Notes (Signed)
Patient continues to sit up and pull at cords, attempting to sit up despite sedation, brought to CT with provider to attempt a scan

## 2019-01-06 NOTE — Progress Notes (Signed)
EEG completed, results pending LTM to follow 

## 2019-01-06 NOTE — ED Notes (Signed)
Due to pt pulling at monitors and attempting to get out of bed, pt has a Air cabin crew. Pt unable to hold himself up when attempting to stand. Pt very unsteady.

## 2019-01-06 NOTE — Progress Notes (Signed)
Spoke with Lovena Le RN and made her aware that this patient has already been stuck by IV team twice in pacu for IV starts, once with the ultrasound. Patient already has 3 PIV accesses in place and needs more access. This nurse recommends a central line or PICC line at this time

## 2019-01-06 NOTE — Sedation Documentation (Signed)
Pt intubated, 26 at the teeth

## 2019-01-06 NOTE — Progress Notes (Signed)
Inpatient Diabetes Program Recommendations  AACE/ADA: New Consensus Statement on Inpatient Glycemic Control (2015)  Target Ranges:  Prepandial:   less than 140 mg/dL      Peak postprandial:   less than 180 mg/dL (1-2 hours)      Critically ill patients:  140 - 180 mg/dL   Lab Results  Component Value Date   GLUCAP 70 01/06/2019   HGBA1C 6.9 (H) 11/23/2018    Review of Glycemic Control Results for VIRON, KONRATH (MRN [id number]) as of 01/06/2019 12:07  Ref. Range 01/05/2019 23:59 01/06/2019 04:43 01/06/2019 07:55 01/06/2019 09:01 01/06/2019 10:32 01/06/2019 11:15  Glucose-Capillary Latest Ref Range: 70 - 99 mg/dL 111 (H) 79 63 (L) 74 45 (L) 70   Diabetes history: DM 2 Outpatient Diabetes medications: Lantus 25 units, Humalog 5-10 units tid Current orders for Inpatient glycemic control:  Levemir 10 units Novolog 0-20 units Q4 hours  A1c 6.9% on 7/27  Noted pt not on steroids on Resistant correction and basal insulin  Inpatient Diabetes Program Recommendations:    Hypoglycemia. Pt not currently on steroids. Glucose trends not requiring insulin yet.   May need to reduce correction scale to 0-9 units Q4 hours until glucose is consistently >180 mg/dl.   May also need to hold basal insulin dose.  Thanks,  Tama Headings RN, MSN, BC-ADM Inpatient Diabetes Coordinator Team Pager [phone] (8a-5p)

## 2019-01-06 NOTE — ED Notes (Signed)
Called mom and told her that her son was at the hospital.

## 2019-01-06 NOTE — Brief Op Note (Signed)
01/06/2019  6:56 AM  PATIENT:  Beacher May  43 y.o. male  PRE-OPERATIVE DIAGNOSIS:  Left Subdural Hematoma  POST-OPERATIVE DIAGNOSIS:  * No post-op diagnosis entered *  PROCEDURE:  Procedure(s): CRANIOTOMY HEMATOMA EVACUATION SUBDURAL (Left)  SURGEON:  Surgeon(s) and Role:    Earnie Larsson, MD - Primary  PHYSICIAN ASSISTANT:   ASSISTANTSMearl Latin   ANESTHESIA:   general  EBL:  100 mL   BLOOD ADMINISTERED:none  DRAINS: (10 mm ) Blake drain(s) in the Subdural Drain   LOCAL MEDICATIONS USED:  NONE  SPECIMEN:  No Specimen  DISPOSITION OF SPECIMEN:  N/A  COUNTS:  YES  TOURNIQUET:  * No tourniquets in log *  DICTATION: .Dragon Dictation  PLAN OF CARE: Admit to inpatient   PATIENT DISPOSITION:  PACU - hemodynamically stable.   Delay start of Pharmacological VTE agent (>24hrs) due to surgical blood loss or risk of bleeding: yes

## 2019-01-06 NOTE — ED Notes (Signed)
[facility] in moving pt to stretcher for transport

## 2019-01-06 NOTE — ED Notes (Signed)
Pt attempting to get out of bed. Pt unsafe because he is attempting to get out of bed without assistance. Pt repositioned in bed and safety sitter still at bedside. PA aware of pt's behavior.

## 2019-01-06 NOTE — ED Notes (Signed)
This nurse, NT Sam, and NT Sunday Spillers gave this patient full bed bath due to dried feces on hands, nails, and rectum.

## 2019-01-06 NOTE — Anesthesia Procedure Notes (Signed)
Arterial Line Insertion Start/End9/12/2018 6:01 AM, 01/06/2019 6:08 AM Performed by: Oleta Mouse, MD  Patient location: OR. Preanesthetic checklist: patient identified, IV checked, site marked, risks and benefits discussed, surgical consent, monitors and equipment checked, pre-op evaluation, timeout performed and anesthesia consent Right, radial was placed Catheter size: 20 G Hand hygiene performed   Attempts: 1 Procedure performed without using ultrasound guided technique. Following insertion, dressing applied. Post procedure assessment: normal and unchanged  Patient tolerated the procedure well with no immediate complications.

## 2019-01-06 NOTE — ED Notes (Signed)
[facility] arrived for transport 

## 2019-01-06 NOTE — ED Notes (Signed)
Writer still assisting pt to stay in his bed and keep from getting out of bed to hurt himself or others.

## 2019-01-06 NOTE — ED Notes (Signed)
Writer continually assisting pt from hurting themselves as pt attempting to pull IV lines out and take cardiac monitoring off.

## 2019-01-06 NOTE — ED Notes (Signed)
Writer assisting to keep pt in the bed to keep him from hurting himself.

## 2019-01-06 NOTE — Consult Note (Signed)
NAME:  Willie [name], MRN:  [id number], DOB:  [birth year], LOS: 0 ADMISSION DATE:  01/05/2019, CONSULTATION DATE:  01/06/19 REFERRING MD:  Annette Stable, CHIEF COMPLAINT:  Confusion   Brief History   43 year old man with recent SDH s/p evacuation presenting with subdural abscess.  History of present illness   43 year old man with history of alcohol abuse, TBI, recent SDH requiring bilateral craniotomies presenting with worsening confusion and inability to perform ADLs.  Imaging revealed fluid collection on left requiring emergent drainage in OR.  Intraoperative findings consistent with subdural empyema.  Patient left on vent as he has high chance of seizures and inability to protect airway.  PCCM asked to help manage vent.  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 DM2, A1c 6.9% 11/23/18  Significant Hospital Events   01/06/19: subdural empyema evacuation, blake drain placement  Consults:  ID PCCM NSGY primary  Procedures:  01/06/19 Dr. Annette Stable  Significant Diagnostic Tests:  01/06/19 CT Head IMPRESSION: Interval significant worsening in the complex extra-axial collection overlying the left frontotemporal lobe measuring 3.4 cm in greatest diameter with 11 mm of left to rightward shift. Would recommend MRI for further evaluation.  These results were called by telephone at the time of interpretation on 01/06/2019 at 3:59 am to provider [facility] , who verbally acknowledged these results.  Micro Data:  01/06/19 Left subdural empyema culture>>>  Antimicrobials:  01/06/19 Vanc>>> 01/06/19 Zosyn>>>  Interim history/subjective:   Admitted, in PACU.  Objective   Blood pressure (!) 138/94, pulse (!) 101, temperature 97.8 F (36.6 C), temperature source Oral, resp. rate 20, weight 74.8 kg, SpO2 100 %.    Vent Mode: PRVC FiO2 (%):  [100 %] 100 % Set Rate:  [18 bmp] 18 bmp Vt Set:  [620 mL] 620 mL PEEP:  [5 cmH20] 5 cmH20 Plateau Pressure:  [12 cmH20] 12 cmH20   Intake/Output Summary (Last 24 hours) at 01/06/2019 0718 Last data filed at 01/06/2019 7741 Gross per 24 hour  Intake 500 ml  Output 930 ml  Net -430 ml   Filed Weights   01/06/19 0408 01/06/19 0425  Weight: 74.8 kg 74.8 kg    Examination: General: intubated sedated ill appearing man HENT: ETT in place Lungs: Clear, no accessory muscle use Cardiovascular: RRR, ext warm Abdomen: Soft, hypoactive BS Extremities: No edema Neuro: Heavily sedated Skin: no rashes, head drain in place  Resolved Hospital Problem list   NA  Assessment & Plan:  Infected subdural space- s/p evacuation 01/06/19 Postoperative respiratory failure- due to high risk of seizures and inability to protect airway after frontotemporal washout EtOH abuse presumably in remission as he has been hospitalized most of last month - Vent, VAP precautions, check ABG/CXR - Vanc/zosyn, f/u intra-op cultures, appreciate pharmacy help with dosing - LTVM EEG, AEs per primary, PRN ativan ordered - Fentanyl/propofol titrate to RASS 0 - Thiamine/folate - Will follow with you  Best practice:  Diet: NPO Pain/Anxiety/Delirium protocol (if indicated): In place VAP protocol (if indicated): ordered 01/06/19 DVT prophylaxis: SCDs GI prophylaxis: PPI Glucose control: SSI Mobility: BR Code Status: Full Family Communication: Per primary Disposition: ICU  Labs  CBC: Recent Labs  Lab 01/06/19 0002  WBC 9.5  NEUTROABS 7.2  HGB 11.3*  HCT 36.2*  MCV 91.0  PLT 1,312*    Basic Metabolic Panel: Recent Labs  Lab 01/06/19 0002  NA 140  K 3.7  CL 104  CO2 24  GLUCOSE 119*  BUN <5*   CREATININE 0.58*  CALCIUM 8.5*   GFR: Estimated Creatinine Clearance: 127.3 mL/min (A) (by C-G formula based on SCr of 0.58 mg/dL (L)). Recent Labs  Lab 01/06/19 0002 01/06/19 0441  WBC 9.5  --   LATICACIDVEN  --  2.5*    Liver Function Tests: Recent Labs  Lab 01/06/19 0401  AST 35  ALT 38  ALKPHOS 193*  BILITOT 0.2*  PROT 7.4  ALBUMIN 2.7*   No results for input(s): LIPASE, AMYLASE in the last 168 hours. No results for input(s): AMMONIA in the last 168 hours.  ABG    Component Value Date/Time   PHART 7.483 (H) 01/06/2019 0505   PCO2ART 35.9 01/06/2019 0505   PO2ART 202 (H) 01/06/2019 0505   HCO3 26.8 01/06/2019 0505   TCO2 31 11/24/2018 1458   ACIDBASEDEF 3.0 (H) 07/28/2017 1915   O2SAT 99.2 01/06/2019 0505     Coagulation Profile: Recent Labs  Lab 01/06/19 0401  INR 1.2    Cardiac Enzymes: No results for input(s): CKTOTAL, CKMB, CKMBINDEX, TROPONINI in the last 168 hours.  HbA1C: Hgb A1c MFr Bld  Date/Time Value Ref Range Status  11/23/2018 12:45 AM 6.9 (H) 4.8 - 5.6 % Final    Comment:    (NOTE) Pre diabetes:          5.7%-6.4% Diabetes:              >6.4% Glycemic control for   <7.0% adults with diabetes   04/27/2018 05:28 PM 11.9 (H) 4.6 - 6.5 % Final    Comment:    Glycemic Control Guidelines for People with Diabetes:Non Diabetic:  <6%Goal of Therapy: <7%Additional Action Suggested:  >8%     CBG: Recent Labs  Lab 01/05/19 2359 01/06/19 0443  GLUCAP 111* 79    Review of Systems:   Cannot obtain, intubated/sedated  Past Medical History  He,  has a past medical history of Alcohol abuse, Alcoholic pancreatitis, Depression, Diabetes mellitus, type II ([place]), GERD (gastroesophageal reflux disease), High cholesterol, History of low back pain, Hypertension, and Sleep apnea.   Surgical History    Past Surgical History:  Procedure Laterality Date  . COLONOSCOPY W/ BIOPSIES  09/2010   Dr. Ardis Hughs.  for intermittent rectal bleeding.  Mild  sigmoid to descending diverticulosis.  Mild left colon erythema, benign biopsy, probably "prep effect"  . COLONOSCOPY WITH PROPOFOL N/A 07/06/2017   Procedure: COLONOSCOPY WITH PROPOFOL;  Surgeon: Jerene Bears, MD;  Location: [facility];  Service: Gastroenterology;  Laterality: N/A;  . CRANIOTOMY Bilateral 11/24/2018   Procedure: Bilateral craniotomy for evacuation of subdural hematoma;  Surgeon: Erline Levine, MD;  Location: [place];  Service: Neurosurgery;  Laterality: Bilateral;  Bilateral craniotomy for evacuation of subdural hematoma  . LUMBAR MICRODISCECTOMY  ~ 2004   Dr Ellene Route     Social History   reports that he has quit smoking. His smoking use included cigarettes. He started smoking about 2 years ago. He has a 0.25 pack-year smoking history. He has never used smokeless tobacco. He reports current alcohol use. He reports previous drug use.   Family History   His family history includes Diabetes in his mother; Lung cancer in  his brother; Pancreatic cancer in his paternal aunt. There is no history of Colon cancer or Stomach cancer.   Allergies Allergies  Allergen Reactions  . Invokamet [Canagliflozin-Metformin Hcl] Other (See Comments)    Lactic acidosis  . Metformin And Related Other (See Comments)    DRASTIC drop in blood sugar     Home Medications  Prior to Admission medications   Medication Sig Start Date End Date Taking? Authorizing Provider  amLODipine (NORVASC) 10 MG tablet Take 1 tablet (10 mg total) by mouth daily. 11/28/18 12/28/18  Arrien, Jimmy Picket, MD  blood glucose meter kit and supplies KIT Dispense based on patient and insurance preference. Use up to four times daily as directed. (FOR ICD-9 250.00, 250.01). 07/24/18   Debbrah Alar, NP  Continuous Blood Gluc Sensor (FREESTYLE LIBRE 14 DAY SENSOR) MISC Inject 1 each into the skin every 14 (fourteen) days. 04/27/18   Debbrah Alar, NP  escitalopram (LEXAPRO) 20 MG tablet Take 1 tablet (20 mg total) by  mouth daily. 08/17/18   Debbrah Alar, NP  glucose blood (FREESTYLE LITE) test strip Use as instructed 07/24/18   Debbrah Alar, NP  Insulin Glargine (LANTUS SOLOSTAR) 100 UNIT/ML Solostar Pen Inject 20-25 Units into the skin at bedtime. Patient taking differently: Inject 25 Units into the skin at bedtime.  12/04/18   Debbrah Alar, NP  insulin lispro (HUMALOG KWIKPEN) 100 UNIT/ML KwikPen Inject subcutaneously 3 times a day (just before each meal) 09-10-08 units Humalog 5 units with small meal, 10 units with medium sized meal, and 15 units with large meal. Patient taking differently: Inject 5-10 Units into the skin See admin instructions. Inject 5-10 units subcutaneously with each meal - based on CBG 12/04/18   Debbrah Alar, NP  Insulin Pen Needle (ULTICARE SHORT PEN NEEDLES) 31G X 8 MM MISC Use once daily to inject insulin. 04/27/18   Debbrah Alar, NP  Lancets (FREESTYLE) lancets 2 (two) times daily 02/27/17   Debbrah Alar, NP  levETIRAcetam (KEPPRA) 500 MG tablet Take 1 tablet (500 mg total) by mouth 2 (two) times daily for 14 days. 12/27/18 01/10/19  Viona Gilmore D, NP  LORazepam (ATIVAN) 1 MG tablet Take 1 tablet (1 mg total) by mouth every 8 (eight) hours as needed for anxiety. 01/01/19   Debbrah Alar, NP  Multiple Vitamins-Minerals (MULTIVITAMIN) tablet Take 1 tablet by mouth daily. 02/12/16   Palumbo, April, MD  ondansetron (ZOFRAN ODT) 4 MG disintegrating tablet 45m ODT q4 hours prn nausea/vomit Patient taking differently: Take 4 mg by mouth every 4 (four) hours as needed for nausea or vomiting.  10/13/17   BMalvin Johns MD  pantoprazole (PROTONIX) 40 MG tablet Take 1 tablet (40 mg total) by mouth 2 (two) times daily. 12/04/18   ODebbrah Alar NP  QUEtiapine (SEROQUEL) 300 MG tablet Take 1 tablet (300 mg total) by mouth at bedtime. 12/04/18   ODebbrah Alar NP  traMADol (ULTRAM) 50 MG tablet Take 1-2 tablets (50-100 mg total) by mouth every 6 (six)  hours as needed for moderate pain or severe pain. 12/27/18 12/27/19  BViona GilmoreD, NP  traMADol (ULTRAM) 50 MG tablet Take 1-2 tablets (50-100 mg total) by mouth every 6 (six) hours as needed. 12/27/18 12/27/19  BViona GilmoreD, NP     Critical care time: 35 minutes

## 2019-01-06 NOTE — Progress Notes (Signed)
vLTM EEG started/ notified Neuro/

## 2019-01-06 NOTE — ED Notes (Signed)
Pt continually attempting to pull IV lines out, cardiac monitoring off and grabbing at staff members.

## 2019-01-06 NOTE — Procedures (Signed)
Patient Name: Willie [name]  MRN: [id number]  Epilepsy Attending: Lora Havens  Referring Physician/Provider: Dr Ina Homes Date: 01/06/2019 Duration: 23.03 mins  Patient history: 43yo m with BL craniotomies and recurrent left subdural hematoma. EEG to evaluate for seizure  Level of alertness: comatose  AEDs during EEG study: keppra, propofol  Technical aspects: This EEG study was done with scalp electrodes positioned according to the 10-20 International system of electrode placement. Electrical activity was acquired at a sampling rate of 500Hz  and reviewed with a high frequency filter of 70Hz  and a low frequency filter of 1Hz . EEG data were recorded continuously and digitally stored.   DESCRIPTION: EEG showed continuous generalized low voltage 2-3Hz  delta slowing, maximal left hemisphere. There is also an excessive amount of 15 to 18 Hz, 2-3 uV beta activity with irregular morphology distributed symmetrically and diffusely.  EEG was reactive to tactile stimuliHyperventilation and photic stimulation were not performed.  IMPRESSION: This study is suggestive of cortical dysfunction in left hemisphere likely secondary to underlying hematoma as well as severe diffuse encephalopathy, likely secondary to sedation.  No seizures or epileptiform discharges were seen throughout the recording.  The excessive beta activity seen in the background is most likely due to the effect of benzodiazepine and is a benign EEG pattern.

## 2019-01-06 NOTE — ED Notes (Signed)
Pt given full bed bath by writer, Sunday Spillers, NT and [place], RN due to pt arriving covered in feces. Pt cooperative. Pt given warm blankets and resting at this time.

## 2019-01-07 ENCOUNTER — Inpatient Hospital Stay (HOSPITAL_COMMUNITY): Payer: Self-pay

## 2019-01-07 ENCOUNTER — Encounter (HOSPITAL_COMMUNITY): Payer: Self-pay | Admitting: Neurosurgery

## 2019-01-07 LAB — BASIC METABOLIC PANEL
Anion gap: 8 (ref 5–15)
BUN: <5 mg/dL — ABNORMAL LOW (ref 6–20)
CO2: 21 mmol/L — ABNORMAL LOW (ref 22–32)
Calcium: 7.8 mg/dL — ABNORMAL LOW (ref 8.9–10.3)
Chloride: 107 mmol/L (ref 98–111)
Creatinine, Ser: 0.56 mg/dL — ABNORMAL LOW (ref 0.61–1.24)
GFR calc Af Amer: >60 mL/min (ref >60)
GFR calc non Af Amer: >60 mL/min (ref >60)
Glucose, Bld: 159 mg/dL — ABNORMAL HIGH (ref 70–99)
Potassium: 4 mmol/L (ref 3.5–5.1)
Sodium: 136 mmol/L (ref 135–145)

## 2019-01-07 LAB — GLUCOSE, CAPILLARY
Glucose-Capillary: 156 mg/dL — ABNORMAL HIGH (ref 70–99)
Glucose-Capillary: 131 mg/dL — ABNORMAL HIGH (ref 70–99)
Glucose-Capillary: 160 mg/dL — ABNORMAL HIGH (ref 70–99)
Glucose-Capillary: 111 mg/dL — ABNORMAL HIGH (ref 70–99)
Glucose-Capillary: 144 mg/dL — ABNORMAL HIGH (ref 70–99)
Glucose-Capillary: 126 mg/dL — ABNORMAL HIGH (ref 70–99)

## 2019-01-07 LAB — PHOSPHORUS
Phosphorus: 3.7 mg/dL (ref 2.5–4.6)
Phosphorus: 4.2 mg/dL (ref 2.5–4.6)

## 2019-01-07 LAB — MAGNESIUM
Magnesium: 2 mg/dL (ref 1.7–2.4)
Magnesium: 1.9 mg/dL (ref 1.7–2.4)

## 2019-01-07 MED ORDER — ESCITALOPRAM OXALATE 10 MG PO TABS: 10.0000 mg | ORAL_TABLET | Freq: Every day | ORAL | Status: DC

## 2019-01-07 MED ORDER — VITAL AF 1.2 CAL PO LIQD
1000.0000 mL | ORAL | Status: DC
  Administered 2019-01-07: 1000 mL
  Filled 2019-01-07 (×4): qty 1000

## 2019-01-07 MED ORDER — SODIUM CHLORIDE 0.9 % IV SOLN
2.0000 g | Freq: Three times a day (TID) | INTRAVENOUS | Status: DC
  Administered 2019-01-07 – 2019-01-12 (×14): 2 g via INTRAVENOUS
  Filled 2019-01-07 (×15): qty 2

## 2019-01-07 MED ORDER — QUETIAPINE FUMARATE 50 MG PO TABS
100.0000 mg | ORAL_TABLET | Freq: Two times a day (BID) | ORAL | Status: DC
  Administered 2019-01-07 – 2019-01-14 (×13): 100 mg via ORAL
  Filled 2019-01-07 (×5): qty 2
  Filled 2019-01-07: qty 1
  Filled 2019-01-07 (×2): qty 2
  Filled 2019-01-07: qty 1
  Filled 2019-01-07 (×3): qty 2
  Filled 2019-01-07: qty 1
  Filled 2019-01-07: qty 2

## 2019-01-07 MED ORDER — CLONAZEPAM 0.5 MG PO TABS
1.0000 mg | ORAL_TABLET | Freq: Two times a day (BID) | ORAL | Status: DC
  Administered 2019-01-07 – 2019-01-14 (×15): 1 mg via ORAL
  Filled 2019-01-07 (×3): qty 2
  Filled 2019-01-07: qty 1
  Filled 2019-01-07 (×2): qty 2
  Filled 2019-01-07 (×2): qty 1
  Filled 2019-01-07 (×6): qty 2
  Filled 2019-01-07: qty 1

## 2019-01-07 MED ORDER — METRONIDAZOLE IN NACL 5-0.79 MG/ML-% IV SOLN
500.0000 mg | Freq: Three times a day (TID) | INTRAVENOUS | Status: DC
  Administered 2019-01-07 – 2019-01-12 (×14): 500 mg via INTRAVENOUS
  Filled 2019-01-07 (×14): qty 100

## 2019-01-07 MED FILL — Thrombin For Soln 20000 Unit: CUTANEOUS | Qty: 1 | Status: AC

## 2019-01-07 NOTE — Progress Notes (Signed)
 Initial Nutrition Assessment  DOCUMENTATION CODES:   Non-severe (moderate) malnutrition in context of chronic illness  INTERVENTION:   Tube Feeding:  Vital AF 1.2 at 50 ml/hr Pro-Stat 30 mL BID Provides 120 g of protein, 1640 kcals and 972 mL free water  TF regimen and propofol at current rate providing 2234 total kcal/day (115 % of kcal needs)   NUTRITION DIAGNOSIS:   Moderate Malnutrition related to chronic illness(chronic pancreaitis, EtOH abuse, SDH) as evidenced by mild fat depletion, mild muscle depletion, moderate muscle depletion.  GOAL:   Patient will meet greater than or equal to 90% of their needs  MONITOR:   Vent status, Labs, Weight trends, TF tolerance, Skin  REASON FOR ASSESSMENT:   Consult, Ventilator Enteral/tube feeding initiation and management  ASSESSMENT:   43 yo male with recent SDH s/p evacuation admitted on 9/09 with subdural abscess. PMH includes recent SDH requiring b/l craniotomoies, EtOH abuse with chronic EtOH pancreatitis, DM, HTN, HLD, depression, anxiety   9/09 Left postop sudural abcess s/p reexploration of frontal craniotomy with evacuation of abscess, placement of subdural drain  Patient is currently intubated on ventilator support MV: 9.1 L/min Temp (24hrs), Avg:98.3 F (36.8 C), Min:97.6 F (36.4 C), Max:98.9 F (37.2 C)  Propofol: 22.5 ml/hr (per RN, MD hoping to transition off propofol over the next 24 hours)  Vital High Protein infusing at 40 ml/hr  Current weight 74.8 kg; appears pt may have experienced weight loss over the past year per weight encounters. Pt sedated and unable to obtain diet and weight history at this time. RN does report that prior to this admission, pt's girlfriend indicating pt not caring well for himself, sitting in his own "feces" etx  Labs: reviewed Meds: [id number] at 75 ml/hr, folic acid, ss novolog, MVI with minerals,  Levemir, thiamine   NUTRITION - FOCUSED PHYSICAL EXAM:    Most Recent Value   Orbital Region  Mild depletion  Upper Arm Region  Mild depletion  Thoracic and Lumbar Region  Mild depletion  Buccal Region  No depletion  Temple Region  Moderate depletion  Clavicle Bone Region  Moderate depletion  Clavicle and Acromion Bone Region  Moderate depletion  Scapular Bone Region  Moderate depletion  Dorsal Hand  Unable to assess  Patellar Region  Mild depletion  Anterior Thigh Region  Moderate depletion  Posterior Calf Region  Mild depletion  Edema (RD Assessment)  None       Diet Order:   Diet Order            Diet NPO time specified  Diet effective now              EDUCATION NEEDS:   Not appropriate for education at this time  Skin:  Skin Integrity Issues:: Incisions Incisions: head  Last BM:  no documented BM  Height:   Ht Readings from Last 1 Encounters:  12/19/18 6' (1.829 m)    Weight:   Wt Readings from Last 1 Encounters:  01/06/19 74.8 kg    Ideal Body Weight:  80.9 kg  BMI:  Body mass index is 22.38 kg/m.  Estimated Nutritional Needs:   Kcal:  1950 kcals  Protein:  112-142 g  Fluid:  >/= 2 L    Arrin Ishler MS, RDN, LDN, CNSC [phone] Pager  [phone] Weekend/On-Call Pager

## 2019-01-07 NOTE — Progress Notes (Signed)
maint complete - no skimn breakdown under f7, fz, t5, p3, a1

## 2019-01-07 NOTE — Progress Notes (Addendum)
Subjective: Patient reports (vent)  Objective: Vital signs in last 24 hours: Temp:  [97 F (36.1 C)-99.5 F (37.5 C)] 97.9 F (36.6 C) (09/10 0400) Pulse Rate:  [34-105] 70 (09/10 0615) Resp:  [16-27] 18 (09/10 0615) BP: (84-164)/(64-116) 113/81 (09/10 0615) SpO2:  [99 %-100 %] 100 % (09/10 0615) Arterial Line BP: (93-161)/(70-119) 109/102 (09/09 1630) FiO2 (%):  [30 %-60 %] 30 % (09/10 0400)  Intake/Output from previous day: 09/09 0701 - 09/10 0700 In: [id number] [I.V.:3120.6; NG/GT:530.7; IV Piggyback:1923.7] Out: 2720 [Urine:2450; Emesis/NG output:200; Drains:70] Intake/Output this shift: No intake/output data recorded.  Sedated on vent. Restraints for pt safety due to agitation off sedation. Repeat CT improved with decresed blood, decreased shift. Drain patent 68ml overnight.  Lab Results: Recent Labs    01/06/19 0002 01/06/19 1220  WBC 9.5 9.4  HGB 11.3* 10.5*  HCT 36.2* 30.7*  PLT 1,312* 1,053*   BMET Recent Labs    01/06/19 1220 01/07/19 0442  NA 139 136  K 2.8* 4.0  CL 104 107  CO2 23 21*  GLUCOSE 73 159*  BUN <5* <5*  CREATININE 0.54* 0.56*  CALCIUM 8.1* 7.8*    Studies/Results: Dg Abdomen 1 View  Result Date: 01/06/2019 CLINICAL DATA:  Orogastric tube placement EXAM: ABDOMEN - 1 VIEW COMPARISON:  Abdominal CT 10/13/2017 FINDINGS: The orogastric tube tip is at the proximal to mid stomach. Upper abdominal bowel gas pattern is normal. No abnormal mass effect. IMPRESSION: Orogastric tube in good position with tip at the stomach. Electronically Signed   By: Monte Fantasia M.D.   On: 01/06/2019 05:22   Ct Head Wo Contrast  Result Date: 01/07/2019 CLINICAL DATA:  Subdural empyema drainage EXAM: CT HEAD WITHOUT CONTRAST TECHNIQUE: Contiguous axial images were obtained from the base of the skull through the vertex without intravenous contrast. COMPARISON:  Head CT 01/06/2019 FINDINGS: Brain: A subdural drainage catheter has been placed over the left convexity. The  fluid collection in this location has decreased in size. There is a small amount of acute blood within the collection now, as well as small volume pneumocephalus. The collection measures up to 12 mm in thickness. Rightward midline shift has greatly reduced and now measures 4 mm. Vascular: No abnormal hyperdensity of the major intracranial arteries or dural venous sinuses. No intracranial atherosclerosis. Skull: Left parietal craniectomy. Remote right parietal craniotomy. Sinuses/Orbits: No fluid levels or advanced mucosal thickening of the visualized paranasal sinuses. No mastoid or middle ear effusion. The orbits are normal. IMPRESSION: 1. Decreased size of left convexity fluid collection following subdural drainage catheter placement. There is a small amount of acute blood within the collection, likely postoperative. 2. Decreased rightward midline shift, now measuring 4 mm. Electronically Signed   By: Ulyses Jarred M.D.   On: 01/07/2019 02:32   Ct Head Wo Contrast  Result Date: 01/06/2019 CLINICAL DATA:  Altered level consciousness, history of TBI and subdural EXAM: CT HEAD WITHOUT CONTRAST TECHNIQUE: Contiguous axial images were obtained from the base of the skull through the vertex without intravenous contrast. COMPARISON:  Multiple priors, most recent December 22, 2018 FINDINGS: Brain: There is interval significant worsening in the extra-axial hypodense collection overlying the left frontotemporal lobe measuring 3.4 cm in greatest diameter. There is a probable thickened dura/flap seen overlying the complex collection. The collection causes midline shift measuring 11 mm from left to right. No downward herniation however is noted. Vascular: No hyperdense vessel or unexpected calcification. Skull: Bilateral craniotomy defects are seen overlying the temporal regions. Sinuses/Orbits: The  visualized paranasal sinuses and mastoid air cells are clear. The orbits and globes intact. Other: None IMPRESSION: Interval  significant worsening in the complex extra-axial collection overlying the left frontotemporal lobe measuring 3.4 cm in greatest diameter with 11 mm of left to rightward shift. Would recommend MRI for further evaluation. These results were called by telephone at the time of interpretation on 01/06/2019 at 3:59 am to provider [facility] , who verbally acknowledged these results. Electronically Signed   By: Prudencio Pair M.D.   On: 01/06/2019 04:03   Dg Chest Portable 1 View  Result Date: 01/06/2019 CLINICAL DATA:  Intubation EXAM: PORTABLE CHEST 1 VIEW COMPARISON:  07/29/2017 FINDINGS: Endotracheal tube tip between the clavicular heads and carina. The orogastric tube at least reaches the stomach. There is no edema, consolidation, effusion, or pneumothorax. Normal heart size and mediastinal contours. IMPRESSION: Unremarkable hardware positioning. No evidence of acute cardiopulmonary disease. Electronically Signed   By: Monte Fantasia M.D.   On: 01/06/2019 05:22    Assessment/Plan:   LOS: 1 day  Supportive care continues   Verdis Prime 01/07/2019, 7:19 AM  Patient is moving upper and lower extremities spontaneously.  He is not following commands.  His pupils are reactive.  I discussed treatment planning, sedation and goals for extubation with pt's nurse and CCM service.

## 2019-01-07 NOTE — Progress Notes (Signed)
NAME:  Willie [name], MRN:  [id number], DOB:  [birth year], LOS: 1 ADMISSION DATE:  01/05/2019, CONSULTATION DATE:  01/06/19 REFERRING MD:  Annette Stable, CHIEF COMPLAINT:  Confusion   Brief History   43 year old man with recent SDH s/p evacuation presenting with subdural abscess.  History of present illness   43 year old man with history of alcohol abuse, TBI, recent SDH requiring bilateral craniotomies presenting with worsening confusion and inability to perform ADLs.  Imaging revealed fluid collection on left requiring emergent drainage in OR.  Intraoperative findings consistent with subdural empyema.  Patient left on vent as he has high chance of seizures and inability to protect airway.  PCCM asked to help manage vent.  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 DM2, A1c 6.9% 11/23/18  Significant Hospital Events   01/06/19: subdural empyema evacuation, blake drain placement  Consults:  ID PCCM NSGY primary  Procedures:  01/06/19 Dr. Annette Stable  Significant Diagnostic Tests:  01/06/19 CT Head IMPRESSION: Interval significant worsening in the complex extra-axial collection overlying the left frontotemporal lobe measuring 3.4 cm in greatest diameter with 11 mm of left to rightward shift. Would recommend MRI for further evaluation.  These results were called by telephone at the time of interpretation on 01/06/2019 at 3:59 am to provider [facility] UPSTILL , who verbally acknowledged these results.  Micro Data:  01/06/19 Left subdural empyema culture>>>  Antimicrobials:  01/06/19 Vanc>>> 01/06/19 Zosyn>>> 01/06/2019 clears>>  Interim  history/subjective:  Currently intensive care unit heavily sedated  Objective   Blood pressure 115/80, pulse 74, temperature 98.1 F (36.7 C), temperature source Axillary, resp. rate 18, weight 74.8 kg, SpO2 100 %.    Vent Mode: PRVC FiO2 (%):  [30 %-40 %] 40 % Set Rate:  [18 bmp] 18 bmp Vt Set:  [500 mL] 500 mL PEEP:  [5 cmH20] 5 cmH20 Plateau Pressure:  [14 cmH20-20 cmH20] 20 cmH20   Intake/Output Summary (Last 24 hours) at 01/07/2019 0839 Last data filed at 01/07/2019 0800 Gross per 24 hour  Intake 6003.1 ml  Output 2720 ml  Net 3283.1 ml   Filed Weights   01/06/19 0408 01/06/19 0425  Weight: 74.8 kg 74.8 kg    Examination: General: 43 year old male heavily sedated HEENT: Endotracheal tube gastric tube are in place, pupils equal react to light.  Cranial dressing with drain urine dry and intact Neuro: Currently heavily sedated CV: Heart sounds are regular rate and rhythm PULM: Decreased breath sounds at the bases GI: soft, bsx4 active  Extremities: warm/dry, negative edema  Skin: no rashes or lesions   Resolved Hospital Problem list   NA  Assessment & Plan:  Vent dependent respiratory failure in the setting of craniotomy for evacuation of frontal temporal infection with washout on 01/06/2019. Wean per protocol Neurological status and history of severe combativeness and agitation will impede weaning process See neurological section for additional sedation medications have been given Vent bundle Serial chest x-rays and ABGs  Postoperative drainage of infection frontal temporal of the subdural space on 01/06/2019 per neurosurgery Wound care per neurosurgery Antimicrobial therapy with day 1 of Rocephin, clindamycin, vancomycin Monitor culture data   Combative behavior  with a history of EtOH abuse Continue propofol and fentanyl for now Begin Seroquel, Lexapro, Klonopin with continuation of folic acid thiamine Suspect he will need at least 24 hours of oral cocktail  before we could wean and extubate him.  Diabetes mellitus CBG (last 3)  Recent Labs    01/06/19 2319 01/07/19 0338 01/07/19 0740  GLUCAP 97 156* 131*   Can stop D5 Continue sliding scale insulin every 4 hours    Best practice:  Diet: NPO Pain/Anxiety/Delirium protocol (if indicated): In place VAP protocol (if indicated): ordered 01/06/19 DVT prophylaxis: SCDs GI prophylaxis: PPI Glucose control: SSI Mobility: BR Code Status: Full Family Communication: Per neurosurgery Disposition: ICU  Labs   CBC: Recent Labs  Lab 01/06/19 0002 01/06/19 1220  WBC 9.5 9.4  NEUTROABS 7.2  --   HGB 11.3* 10.5*  HCT 36.2* 30.7*  MCV 91.0 83.0  PLT 1,312* 1,053*    Basic Metabolic Panel: Recent Labs  Lab 01/06/19 0002 01/06/19 1220 01/07/19 0009 01/07/19 0442  NA 140 139  --  136  K 3.7 2.8*  --  4.0  CL 104 104  --  107  CO2 24 23  --  21*  GLUCOSE 119* 73  --  159*  BUN <5* <5*  --  <5*  CREATININE 0.58* 0.54*  --  0.56*  CALCIUM 8.5* 8.1*  --  7.8*  MG  --   --  2.0 1.9  PHOS  --   --  3.7 4.2   GFR: Estimated Creatinine Clearance: 127.3 mL/min (A) (by C-G formula based on SCr of 0.56 mg/dL (L)). Recent Labs  Lab 01/06/19 0002 01/06/19 0441 01/06/19 1220  WBC 9.5  --  9.4  LATICACIDVEN  --  2.5*  --     Liver Function Tests: Recent Labs  Lab 01/06/19 0401  AST 35  ALT 38  ALKPHOS 193*  BILITOT 0.2*  PROT 7.4  ALBUMIN 2.7*   No results for input(s): LIPASE, AMYLASE in the last 168 hours. No results for input(s): AMMONIA in the last 168 hours.  ABG    Component Value Date/Time   PHART 7.588 (H) 01/06/2019 0925   PCO2ART 26.6 (L) 01/06/2019 0925   PO2ART 258 (H) 01/06/2019 0925   HCO3 25.5 01/06/2019 0925   TCO2 31 11/24/2018 1458   ACIDBASEDEF 3.0 (H) 07/28/2017 1915   O2SAT 98.2 01/06/2019 0925     Coagulation Profile: Recent Labs  Lab 01/06/19 0401  INR 1.2    Cardiac Enzymes: No results for input(s): CKTOTAL, CKMB, CKMBINDEX,  TROPONINI in the last 168 hours.  HbA1C: Hgb A1c MFr Bld  Date/Time Value Ref Range Status  11/23/2018 12:45 AM 6.9 (H) 4.8 - 5.6 % Final    Comment:    (NOTE) Pre diabetes:          5.7%-6.4% Diabetes:              >6.4% Glycemic control for   <7.0% adults with diabetes   04/27/2018 05:28 PM 11.9 (H) 4.6 - 6.5 % Final    Comment:    Glycemic Control Guidelines for People with Diabetes:Non Diabetic:  <6%Goal of Therapy: <7%Additional Action Suggested:  >8%     CBG: Recent Labs  Lab 01/06/19 1959 01/06/19 2024 01/06/19 2319 01/07/19 0338 01/07/19 0740  GLUCAP 66* 96 97 156* 131*     App CCT 39 min    Willie [name] ACNP Maryanna Shape PCCM Pager [phone] till 1 pm If no answer page [phone]  01/07/2019, 8:40 AM

## 2019-01-07 NOTE — Progress Notes (Signed)
Inpatient Diabetes Program Recommendations  AACE/ADA: New Consensus Statement on Inpatient Glycemic Control (2015)  Target Ranges:  Prepandial:   less than 140 mg/dL      Peak postprandial:   less than 180 mg/dL (1-2 hours)      Critically ill patients:  140 - 180 mg/dL   Lab Results  Component Value Date   GLUCAP 131 (H) 01/07/2019   HGBA1C 6.9 (H) 11/23/2018    Review of Glycemic Control Results for RAANAN, AUGUSTYNIAK (MRN [id number]) as of 01/06/2019 12:07  Ref. Range 01/05/2019 23:59 01/06/2019 04:43  Glucose-Capillary Latest Ref Range: 70 - 99 mg/dL 111 (H) 79  Results for SERJIO, STROHMAIER (MRN [id number]) as of 01/07/2019 10:34  Ref. Range 01/06/2019 07:55 01/06/2019 09:01 01/06/2019 10:32 01/06/2019 11:15 01/06/2019 16:04 01/06/2019 16:39 01/06/2019 17:35 01/06/2019 19:28 01/06/2019 19:59 01/06/2019 20:24 01/06/2019 23:19 01/07/2019 03:38 01/07/2019 07:40  Glucose-Capillary Latest Ref Range: 70 - 99 mg/dL 63 (L) 74 45 (L) 70 36 (LL) 67 (L) 85 57 (L) 66 (L) 96 97 156 (H) 131 (H)   Diabetes history: DM 2 Outpatient Diabetes medications: Lantus 25 units, Humalog 5-10 units tid Current orders for Inpatient glycemic control:  Levemir 10 units Novolog 0-24 units Q4 hours  A1c 6.9% on 7/27  Noted pt not on steroids on Resistant correction and basal insulin  Inpatient Diabetes Program Recommendations:    Hypoglycemia      Tube Feeds started overnight      Did not get basal insulin last night     Consider:   -   D/c basal insulin   -   D/c Novolog 0-24 correction scale (Thoracic surgery scale only)   -  Order ICU Glycemic control order set phase 1 SQ insulin resistant scale 3-9 units Q4 units  May need to consider Tube Feed coverage if trends still elevated.  Thanks,  Tama Headings RN, MSN, BC-ADM Inpatient Diabetes Coordinator Team Pager [phone] (8a-5p)

## 2019-01-07 NOTE — Procedures (Addendum)
Patient Name: Willie [name]  MRN: [id number]  Epilepsy Attending: Lora Havens  Referring Physician/Provider: Dr Ina Homes Duration: 01/06/2019 1819 to 01/07/2019 1819  Patient history: 43yo m with BL craniotomies and recurrent left subdural hematoma. EEG to evaluate for seizure  Level of alertness: comatose  AEDs during EEG study: keppra, propofol  Technical aspects: This EEG study was done with scalp electrodes positioned according to the 10-20 International system of electrode placement. Electrical activity was acquired at a sampling rate of 500Hz  and reviewed with a high frequency filter of 70Hz  and a low frequency filter of 1Hz . EEG data were recorded continuously and digitally stored.   DESCRIPTION: EEG showed continuous generalized low voltage 2-3Hz  delta slowing, maximal left hemisphere. There is also an excessive amount of 15 to 18 Hz, 2-3 uV beta activity with irregular morphology distributed symmetrically and diffusely.  EEG was reactive to tactile stimuli. Hyperventilation and photic stimulation were not performed.  ABNORMALITY 1. Continuous slow, generalized, maximal left hemisphere 2. Excessive beta, generalized   IMPRESSION: This study is suggestive of cortical dysfunction in left hemisphere likely secondary to underlying hematoma as well as severe diffuse encephalopathy, likely secondary to sedation.  No seizures or epileptiform discharges were seen throughout the recording.  The excessive beta activity seen in the background is most likely due to the effect of benzodiazepine and is a benign EEG pattern.  Karalina Tift Barbra Sarks

## 2019-01-08 ENCOUNTER — Inpatient Hospital Stay (HOSPITAL_COMMUNITY): Payer: Self-pay

## 2019-01-08 LAB — CBC WITH DIFFERENTIAL/PLATELET
Abs Immature Granulocytes: 0.06 10*3/uL (ref 0.00–0.07)
Basophils Absolute: 0.1 10*3/uL (ref 0.0–0.1)
Basophils Relative: 1 %
Eosinophils Absolute: 0.3 10*3/uL (ref 0.0–0.5)
Eosinophils Relative: 3 %
HCT: 31 % — ABNORMAL LOW (ref 39.0–52.0)
Hemoglobin: 9.9 g/dL — ABNORMAL LOW (ref 13.0–17.0)
Immature Granulocytes: 1 %
Lymphocytes Relative: 17 %
Lymphs Abs: 1.7 10*3/uL (ref 0.7–4.0)
MCH: 28 pg (ref 26.0–34.0)
MCHC: 31.9 g/dL (ref 30.0–36.0)
MCV: 87.8 fL (ref 80.0–100.0)
Monocytes Absolute: 0.6 10*3/uL (ref 0.1–1.0)
Monocytes Relative: 6 %
Neutro Abs: 7.4 10*3/uL (ref 1.7–7.7)
Neutrophils Relative %: 72 %
Platelets: 750 10*3/uL — ABNORMAL HIGH (ref 150–400)
RBC: 3.53 MIL/uL — ABNORMAL LOW (ref 4.22–5.81)
RDW: 13.6 % (ref 11.5–15.5)
WBC: 10.1 10*3/uL (ref 4.0–10.5)
nRBC: 0 % (ref 0.0–0.2)

## 2019-01-08 LAB — BASIC METABOLIC PANEL
Anion gap: 7 (ref 5–15)
BUN: 5 mg/dL — ABNORMAL LOW (ref 6–20)
CO2: 21 mmol/L — ABNORMAL LOW (ref 22–32)
Calcium: 8 mg/dL — ABNORMAL LOW (ref 8.9–10.3)
Chloride: 111 mmol/L (ref 98–111)
Creatinine, Ser: 0.53 mg/dL — ABNORMAL LOW (ref 0.61–1.24)
GFR calc Af Amer: >60 mL/min (ref >60)
GFR calc non Af Amer: >60 mL/min (ref >60)
Glucose, Bld: 151 mg/dL — ABNORMAL HIGH (ref 70–99)
Potassium: 3.9 mmol/L (ref 3.5–5.1)
Sodium: 139 mmol/L (ref 135–145)

## 2019-01-08 LAB — GLUCOSE, CAPILLARY
Glucose-Capillary: 97 mg/dL (ref 70–99)
Glucose-Capillary: 190 mg/dL — ABNORMAL HIGH (ref 70–99)
Glucose-Capillary: 96 mg/dL (ref 70–99)
Glucose-Capillary: 195 mg/dL — ABNORMAL HIGH (ref 70–99)
Glucose-Capillary: 111 mg/dL — ABNORMAL HIGH (ref 70–99)
Glucose-Capillary: 134 mg/dL — ABNORMAL HIGH (ref 70–99)

## 2019-01-08 LAB — AEROBIC CULTURE W GRAM STAIN (SUPERFICIAL SPECIMEN): Culture: NO GROWTH

## 2019-01-08 LAB — TRIGLYCERIDES: Triglycerides: 75 mg/dL (ref <150)

## 2019-01-08 LAB — PHOSPHORUS: Phosphorus: 5.2 mg/dL — ABNORMAL HIGH (ref 2.5–4.6)

## 2019-01-08 LAB — PATHOLOGIST SMEAR REVIEW

## 2019-01-08 LAB — MAGNESIUM: Magnesium: 1.8 mg/dL (ref 1.7–2.4)

## 2019-01-08 MED ORDER — INSULIN ASPART 100 UNIT/ML ~~LOC~~ SOLN
0.0000 [IU] | SUBCUTANEOUS | Status: DC
  Administered 2019-01-08: 4 [IU] via SUBCUTANEOUS
  Administered 2019-01-08: 23:00:00 3 [IU] via SUBCUTANEOUS
  Administered 2019-01-08: 09:00:00 4 [IU] via SUBCUTANEOUS
  Administered 2019-01-09 (×2): 3 [IU] via SUBCUTANEOUS
  Administered 2019-01-09: 17:00:00 7 [IU] via SUBCUTANEOUS
  Administered 2019-01-10: 04:00:00 4 [IU] via SUBCUTANEOUS
  Administered 2019-01-10: 17:00:00 3 [IU] via SUBCUTANEOUS
  Administered 2019-01-10: 13:00:00 11 [IU] via SUBCUTANEOUS
  Administered 2019-01-10 – 2019-01-11 (×2): 7 [IU] via SUBCUTANEOUS
  Administered 2019-01-11: 12:00:00 11 [IU] via SUBCUTANEOUS
  Administered 2019-01-11: 09:00:00 3 [IU] via SUBCUTANEOUS

## 2019-01-08 MED ORDER — ORAL CARE MOUTH RINSE
15.0000 mL | Freq: Two times a day (BID) | OROMUCOSAL | Status: DC
  Administered 2019-01-08 (×2): 15 mL via OROMUCOSAL

## 2019-01-08 NOTE — Progress Notes (Signed)
NAME:  Willie [name], MRN:  [id number], DOB:  1976-02-10, LOS: 2 ADMISSION DATE:  01/05/2019, CONSULTATION DATE:  01/06/19 REFERRING MD:  Annette Stable, CHIEF COMPLAINT:  Confusion   Brief History   43 year old man with recent SDH s/p evacuation presenting with subdural abscess.  History of present illness   43 year old man with history of alcohol abuse, TBI, recent SDH requiring bilateral craniotomies presenting with worsening confusion and inability to perform ADLs.  Imaging revealed fluid collection on left requiring emergent drainage in OR.  Intraoperative findings consistent with subdural empyema.  Patient left on vent as he has high chance of seizures and inability to protect airway.  PCCM asked to help manage vent.  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 DM2, A1c 6.9% 11/23/18  Significant Hospital Events   01/06/19: subdural empyema evacuation, blake drain placement  Consults:  ID PCCM NSGY primary  Procedures:  01/06/19 Dr. Annette Stable  Significant Diagnostic Tests:  01/06/19 CT Head IMPRESSION: Interval significant worsening in the complex extra-axial collection overlying the left frontotemporal lobe measuring 3.4 cm in greatest diameter with 11 mm of left to rightward shift. Would recommend MRI for further evaluation.  These results were called by telephone at the time of interpretation on 01/06/2019 at 3:59 am to provider [facility] , who verbally acknowledged these results.  Micro Data:  01/06/19 Left subdural empyema culture>>>  Antimicrobials:  01/06/19 Vanc>>> 01/06/19 Zosyn>>> 01/06/2019 clears>>  Interim  history/subjective:  Currently intensive care unit heavily sedated  Objective   Blood pressure 112/78, pulse 88, temperature (!) 97.5 F (36.4 C), temperature source Axillary, resp. rate 18, weight 74.8 kg, SpO2 100 %.    Vent Mode: PRVC FiO2 (%):  [40 %] 40 % Set Rate:  [18 bmp] 18 bmp Vt Set:  [500 mL] 500 mL PEEP:  [5 cmH20] 5 cmH20 Plateau Pressure:  [12 cmH20-15 cmH20] 14 cmH20   Intake/Output Summary (Last 24 hours) at 01/08/2019 0823 Last data filed at 01/08/2019 0700 Gross per 24 hour  Intake 3970.2 ml  Output 2625 ml  Net 1345.2 ml   Filed Weights   01/06/19 0408 01/06/19 0425  Weight: 74.8 kg 74.8 kg    Examination: General: Middle-aged male who sedated on full mechanical ventilatory support HEENT: Endotracheal tube gastric tube in place.  Jackson-Pratt drain noted left craniotomy Neuro: Currently sedated does not follow commands. CV: Sounds are regular regular rhythm PULM: Diminished in the base GI: soft, bsx4 active  Extremities: warm/dry, negative edema  Skin: no rashes or lesions    Resolved Hospital Problem list   NA  Assessment & Plan:  Vent dependent respiratory failure in the setting of craniotomy for evacuation of frontal temporal infection with washout on 01/06/2019. Wean per protocol Multiple anxiolytics and serotonin inhibitors to quiet and psychological storm Serial chest x-rays and ABGs  Postoperative drainage of infection frontal temporal of the subdural space on 01/06/2019 per neurosurgery Wound care per neurosurgery Day 2 of Rocephin, clindamycin, vancomycin Monitor culture data 01/08/2019 no positive culture data   Combative behavior with a history of EtOH abuse Weaning propofol and fentanyl Continue Seroquel, Lexapro, Klonopin, folic acid and  thiamine We will continue to slowly wean off propofol Neurology's input noted  Diabetes mellitus CBG (last 3)  Recent Labs    01/07/19 1932 01/07/19 2313 01/08/19 0319  GLUCAP 144* 126* 97    Continue sliding scale insulin coverage every 4 hours resistant Basal insulin stopped at this time 01/08/1999     Best practice:  Diet: NPO Pain/Anxiety/Delirium protocol (if indicated): In place VAP protocol (if indicated): ordered 01/06/19 DVT prophylaxis: SCDs GI prophylaxis: PPI Glucose control: SSI 01/08/2019 changed to every 4 hours resistant scale with Levemir discontinued Mobility: BR Code Status: Full Family Communication: Per neurosurgery Disposition: ICU  Labs   CBC: Recent Labs  Lab 01/06/19 0002 01/06/19 1220 01/08/19 0400  WBC 9.5 9.4 10.1  NEUTROABS 7.2  --  7.4  HGB 11.3* 10.5* 9.9*  HCT 36.2* 30.7* 31.0*  MCV 91.0 83.0 87.8  PLT 1,312* 1,053* 750*    Basic Metabolic Panel: Recent Labs  Lab 01/06/19 0002 01/06/19 1220 01/07/19 0009 01/07/19 0442 01/08/19 0400  NA 140 139  --  136 139  K 3.7 2.8*  --  4.0 3.9  CL 104 104  --  107 111  CO2 24 23  --  21* 21*  GLUCOSE 119* 73  --  159* 151*  BUN <5* <5*  --  <5* 5*  CREATININE 0.58* 0.54*  --  0.56* 0.53*  CALCIUM 8.5* 8.1*  --  7.8* 8.0*  MG  --   --  2.0 1.9 1.8  PHOS  --   --  3.7 4.2 5.2*   GFR: Estimated Creatinine Clearance: 127.3 mL/min (A) (by C-G formula based on SCr of 0.53 mg/dL (L)). Recent Labs  Lab 01/06/19 0002 01/06/19 0441 01/06/19 1220 01/08/19 0400  WBC 9.5  --  9.4 10.1  LATICACIDVEN  --  2.5*  --   --     Liver Function Tests: Recent Labs  Lab 01/06/19 0401  AST 35  ALT 38  ALKPHOS 193*  BILITOT 0.2*  PROT 7.4  ALBUMIN 2.7*   No results for input(s): LIPASE, AMYLASE in the last 168 hours. No results for input(s): AMMONIA in the last 168 hours.  ABG    Component Value Date/Time   PHART 7.588 (H) 01/06/2019 0925   PCO2ART 26.6 (L) 01/06/2019 0925   PO2ART 258 (H) 01/06/2019 0925   HCO3 25.5 01/06/2019 0925   TCO2 31 11/24/2018 1458   ACIDBASEDEF 3.0 (H) 07/28/2017 1915   O2SAT 98.2 01/06/2019 0925     Coagulation Profile: Recent Labs  Lab  01/06/19 0401  INR 1.2    Cardiac Enzymes: No results for input(s): CKTOTAL, CKMB, CKMBINDEX, TROPONINI in the last 168 hours.  HbA1C: Hgb A1c MFr Bld  Date/Time Value Ref Range Status  11/23/2018 12:45 AM 6.9 (H) 4.8 - 5.6 % Final    Comment:    (NOTE) Pre diabetes:          5.7%-6.4% Diabetes:              >6.4% Glycemic control for   <7.0% adults with diabetes   04/27/2018 05:28 PM 11.9 (H) 4.6 - 6.5 % Final    Comment:    Glycemic Control Guidelines for People with Diabetes:Non Diabetic:  <6%Goal of Therapy: <7%Additional Action Suggested:  >8%     CBG: Recent Labs  Lab 01/07/19 1122 01/07/19 1554 01/07/19 1932 01/07/19 2313 01/08/19 0319  GLUCAP 160* 111* 144* 126* 97     App CCT 37 min    Richardson Landry  Abagayle Klutts ACNP Maryanna Shape PCCM Pager [phone] till 1 pm If no answer page [phone] 01/08/2019, 8:23 AM

## 2019-01-08 NOTE — Progress Notes (Signed)
Wasted 22ml fentanyl in sink w/ Lianne Bushy, RN.

## 2019-01-08 NOTE — Progress Notes (Addendum)
Subjective: Patient reports responding slowly, states correct year when asked  Objective: Vital signs in last 24 hours: Temp:  [97.5 F (36.4 C)-99.3 F (37.4 C)] 98.6 F (37 C) (09/11 1054) Pulse Rate:  [68-101] 101 (09/11 1054) Resp:  [17-22] 22 (09/11 1054) BP: (90-152)/(62-108) 126/88 (09/11 0800) SpO2:  [100 %] 100 % (09/11 1054) Arterial Line BP: (115-121)/(70-111) 115/111 (09/10 1700) FiO2 (%):  [40 %] 40 % (09/11 0938)  Intake/Output from previous day: 09/10 0701 - 09/11 0700 In: 4298.3 [I.V.:1788; NG/GT:1025.8; IV Piggyback:1484.5] Out: 2625 [Urine:2600; Drains:25] Intake/Output this shift: Total I/O In: 218.4 [I.V.:68.4; NG/GT:50; IV Piggyback:100] Out: 575 [Urine:575]  Bath in progress. Doing well off vent. Cooperates, follows commands. PEARL MAEW  Lab Results: Recent Labs    01/06/19 1220 01/08/19 0400  WBC 9.4 10.1  HGB 10.5* 9.9*  HCT 30.7* 31.0*  PLT 1,053* 750*   BMET Recent Labs    01/07/19 0442 01/08/19 0400  NA 136 139  K 4.0 3.9  CL 107 111  CO2 21* 21*  GLUCOSE 159* 151*  BUN <5* 5*  CREATININE 0.56* 0.53*  CALCIUM 7.8* 8.0*    Studies/Results: Dg Chest 1 View  Result Date: 01/07/2019 CLINICAL DATA:  Check endotracheal tube placement EXAM: CHEST  1 VIEW COMPARISON:  01/06/2019 FINDINGS: Endotracheal tube and gastric catheter are noted in satisfactory position. Cardiac shadow is within normal limits. The lungs are well aerated bilaterally. No focal infiltrate or sizable effusion is seen. No pneumothorax is noted. IMPRESSION: Tubes and lines as described above.  No acute abnormality noted. Electronically Signed   By: Inez Catalina M.D.   On: 01/07/2019 08:29   Ct Head Wo Contrast  Result Date: 01/07/2019 CLINICAL DATA:  Subdural empyema drainage EXAM: CT HEAD WITHOUT CONTRAST TECHNIQUE: Contiguous axial images were obtained from the base of the skull through the vertex without intravenous contrast. COMPARISON:  Head CT 01/06/2019 FINDINGS:  Brain: A subdural drainage catheter has been placed over the left convexity. The fluid collection in this location has decreased in size. There is a small amount of acute blood within the collection now, as well as small volume pneumocephalus. The collection measures up to 12 mm in thickness. Rightward midline shift has greatly reduced and now measures 4 mm. Vascular: No abnormal hyperdensity of the major intracranial arteries or dural venous sinuses. No intracranial atherosclerosis. Skull: Left parietal craniectomy. Remote right parietal craniotomy. Sinuses/Orbits: No fluid levels or advanced mucosal thickening of the visualized paranasal sinuses. No mastoid or middle ear effusion. The orbits are normal. IMPRESSION: 1. Decreased size of left convexity fluid collection following subdural drainage catheter placement. There is a small amount of acute blood within the collection, likely postoperative. 2. Decreased rightward midline shift, now measuring 4 mm. Electronically Signed   By: Ulyses Jarred M.D.   On: 01/07/2019 02:32   Dg Chest Port 1 View  Result Date: 01/08/2019 CLINICAL DATA:  Respiratory failure EXAM: PORTABLE CHEST 1 VIEW COMPARISON:  Portable exam 0535 hours compared to 01/07/2019 FINDINGS: Tip of endotracheal tube projects 2.8 cm above carina. Nasogastric tube extends into stomach. Normal heart size, mediastinal contours, and pulmonary vascularity. Mild RIGHT basilar atelectasis. Remaining lungs clear. No pleural effusion or pneumothorax. Osseous structures unremarkable. IMPRESSION: Mild RIGHT basilar atelectasis. Electronically Signed   By: Lavonia Dana M.D.   On: 01/08/2019 08:30    Assessment/Plan: improving  LOS: 2 days  Supportive care continues   Verdis Prime 01/08/2019, 1:30 PM  Patient is extubated and improving.  Continue  IV ABX.  Awaiting culture results.

## 2019-01-08 NOTE — Procedures (Signed)
Extubation Procedure Note  Patient Details:   Name: OSEPH BREAKIRON DOB: 12/20/1975 MRN: [id number]   Airway Documentation:    Vent end date: 01/08/19 Vent end time: 1049   Evaluation  O2 sats: stable throughout Complications: No apparent complications Patient did tolerate procedure well. Bilateral Breath Sounds: Diminished  Patient able to talk: Yes  Rudene Re 01/08/2019, 10:52 AM

## 2019-01-08 NOTE — Progress Notes (Signed)
vLTM EEG d/c per neuro. No skin breakdown seen.

## 2019-01-08 NOTE — Procedures (Addendum)
[id number] Epilepsy Attending:Ixchel Duck Barbra Sarks Referring Physician/Provider:Dr Ina Homes Duration: 01/07/2019 1819 to 01/08/2019 1035  Patient [id number] m with BL craniotomies and recurrent left subdural hematoma. EEG to evaluate for seizure  Level of alertness:comatose  AEDs during EEG study:keppra, propofol  Technical aspects: This EEG study was done with scalp electrodes positioned according to the 10-20 International system of electrode placement. Electrical activity was acquired at a sampling rate of 500Hz  and reviewed with a high frequency filter of 70Hz  and a low frequency filter of 1Hz . EEG data were recorded continuously and digitally stored.  DESCRIPTION: EEG showed continuous generalized low voltage 2-3Hz  delta slowing, maximal left hemisphere.EEG was reactive to tactile stimuli. Hyperventilation and photic stimulation were not performed.  ABNORMALITY 1. Continuous slow, generalized, maximal left hemisphere  IMPRESSION: This study issuggestive of cortical dysfunction in left hemisphere likely secondary to underlying hematoma as well as severe diffuse encephalopathy, likely secondary to sedation.No seizures or epileptiform discharges were seen throughout the recording. EEG is similar compared to previous study.   Jamesia Linnen Barbra Sarks

## 2019-01-09 ENCOUNTER — Inpatient Hospital Stay (HOSPITAL_COMMUNITY): Payer: Self-pay

## 2019-01-09 DIAGNOSIS — E440 Moderate protein-calorie malnutrition: Secondary | ICD-10-CM | POA: Insufficient documentation

## 2019-01-09 LAB — BASIC METABOLIC PANEL
Anion gap: 9 (ref 5–15)
BUN: <5 mg/dL — ABNORMAL LOW (ref 6–20)
CO2: 22 mmol/L (ref 22–32)
Calcium: 8.1 mg/dL — ABNORMAL LOW (ref 8.9–10.3)
Chloride: 106 mmol/L (ref 98–111)
Creatinine, Ser: 0.57 mg/dL — ABNORMAL LOW (ref 0.61–1.24)
GFR calc Af Amer: >60 mL/min (ref >60)
GFR calc non Af Amer: >60 mL/min (ref >60)
Glucose, Bld: 108 mg/dL — ABNORMAL HIGH (ref 70–99)
Potassium: 3.6 mmol/L (ref 3.5–5.1)
Sodium: 137 mmol/L (ref 135–145)

## 2019-01-09 LAB — CBC WITH DIFFERENTIAL/PLATELET
Abs Immature Granulocytes: 0.02 10*3/uL (ref 0.00–0.07)
Basophils Absolute: 0.1 10*3/uL (ref 0.0–0.1)
Basophils Relative: 1 %
Eosinophils Absolute: 0.2 10*3/uL (ref 0.0–0.5)
Eosinophils Relative: 3 %
HCT: 31.6 % — ABNORMAL LOW (ref 39.0–52.0)
Hemoglobin: 10.3 g/dL — ABNORMAL LOW (ref 13.0–17.0)
Immature Granulocytes: 0 %
Lymphocytes Relative: 12 %
Lymphs Abs: 1 10*3/uL (ref 0.7–4.0)
MCH: 28.1 pg (ref 26.0–34.0)
MCHC: 32.6 g/dL (ref 30.0–36.0)
MCV: 86.3 fL (ref 80.0–100.0)
Monocytes Absolute: 0.4 10*3/uL (ref 0.1–1.0)
Monocytes Relative: 5 %
Neutro Abs: 6.5 10*3/uL (ref 1.7–7.7)
Neutrophils Relative %: 79 %
Platelets: 566 10*3/uL — ABNORMAL HIGH (ref 150–400)
RBC: 3.66 MIL/uL — ABNORMAL LOW (ref 4.22–5.81)
RDW: 13.1 % (ref 11.5–15.5)
WBC: 8.2 10*3/uL (ref 4.0–10.5)
nRBC: 0 % (ref 0.0–0.2)

## 2019-01-09 LAB — GLUCOSE, CAPILLARY
Glucose-Capillary: 127 mg/dL — ABNORMAL HIGH (ref 70–99)
Glucose-Capillary: 115 mg/dL — ABNORMAL HIGH (ref 70–99)
Glucose-Capillary: 122 mg/dL — ABNORMAL HIGH (ref 70–99)
Glucose-Capillary: 239 mg/dL — ABNORMAL HIGH (ref 70–99)
Glucose-Capillary: 102 mg/dL — ABNORMAL HIGH (ref 70–99)

## 2019-01-09 LAB — PHOSPHORUS: Phosphorus: 4.3 mg/dL (ref 2.5–4.6)

## 2019-01-09 LAB — MAGNESIUM: Magnesium: 1.5 mg/dL — ABNORMAL LOW (ref 1.7–2.4)

## 2019-01-09 MED ORDER — ORAL CARE MOUTH RINSE
15.0000 mL | Freq: Two times a day (BID) | OROMUCOSAL | Status: DC
  Administered 2019-01-09 – 2019-01-14 (×11): 15 mL via OROMUCOSAL

## 2019-01-09 MED ORDER — RESOURCE THICKENUP CLEAR PO POWD
ORAL | Status: DC | PRN
  Filled 2019-01-09: qty 125

## 2019-01-09 NOTE — Evaluation (Signed)
Clinical/Bedside Swallow Evaluation Patient Details  Name: Willie [name] MRN: [id number] Date of Birth: [birth year]  Today's Date: 01/09/2019 Time: SLP Start Time (ACUTE ONLY): 1011 SLP Stop Time (ACUTE ONLY): 1030 SLP Time Calculation (min) (ACUTE ONLY): 19 min  Past Medical History:  Past Medical History:  Diagnosis Date  . Alcohol abuse   . Alcoholic pancreatitis    recurrent  . Depression   . Diabetes mellitus, type II ([place])    New Onset 03/2010  . GERD (gastroesophageal reflux disease)   . High cholesterol   . 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 Hypertension   . Sleep apnea    does not wear a CPAP   Past Surgical History:  Past Surgical History:  Procedure Laterality Date  . COLONOSCOPY W/ BIOPSIES  09/2010   Dr. Ardis Hughs.  for intermittent rectal bleeding.  Mild sigmoid to descending diverticulosis.  Mild left colon erythema, benign biopsy, probably "prep effect"  . COLONOSCOPY WITH PROPOFOL N/A 07/06/2017   Procedure: COLONOSCOPY WITH PROPOFOL;  Surgeon: Jerene Bears, MD;  Location: [facility];  Service: Gastroenterology;  Laterality: N/A;  . CRANIOTOMY Bilateral 11/24/2018   Procedure: Bilateral craniotomy for evacuation of subdural hematoma;  Surgeon: Erline Levine, MD;  Location: [place];  Service: Neurosurgery;  Laterality: Bilateral;  Bilateral craniotomy for evacuation of subdural hematoma  . CRANIOTOMY Left 01/06/2019   Procedure: CRANIECTOMY;  Surgeon: Earnie Larsson, MD;  Location: [place];  Service: Neurosurgery;  Laterality: Left;  . LUMBAR MICRODISCECTOMY  ~ 2004   Dr Ellene Route   HPI:  43 year old male status post prior bilateral craniotomies for subdural by Dr. Vertell Limber.  Patient with recurrent left-sided subdural hematoma status post drain placement and secondary evacuation by Dr. Saintclair Halsted little over 10 days ago.  The patient now presents with worsening confusion and agitation.  CT scan done at [facility] long emergency room  demonstrates a large left frontal subdural hematoma which appears to be subacute in nature.  There is marked mass-effect with significant left to right shift.  There is no evidence of obvious acute hemorrhage.  The patient has no history of fever or other signs of infection.  No documented seizure activity CXR 01/09/19 indicated Worsened opacification right base likely atelectasis  Assessment / Plan / Recommendation Clinical Impression  Pt exhibits oropharyngeal dysphagia characterized by immediate cough with tsp/cup sips of thin liquids which improved significantly with nectar-thick sips (no straw given d/t lethargy), delayed throat clearing with puree/solids paired with multiple swallows; pt's alertness level also affecting intake; SLE not completed this date d/t decreased mentation; recommend initiating a Dysphagia 1 conservative diet with nectar-thickened liquids initially with ST f/u for diet tolerance and progression as able; thank you for this consult. SLP Visit Diagnosis: Dysphagia, oropharyngeal phase (R13.12)    Aspiration Risk  Mild aspiration risk    Diet Recommendation     Medication Administration: Crushed with puree    Other  Recommendations Oral Care Recommendations: Oral care BID Other Recommendations: Order thickener from pharmacy   Follow up Recommendations Other (comment)(TBD)      Frequency and Duration min 2x/week  1 week       Prognosis Prognosis for Safe Diet Advancement: Fair      Swallow Study   General Date of Onset: 01/06/19 HPI: 43 year old male status post prior bilateral craniotomies for subdural by Dr. Vertell Limber.  Patient with recurrent left-sided subdural hematoma status post drain placement and secondary evacuation  by Dr. Saintclair Halsted little over 10 days ago.  The patient now presents with worsening confusion and agitation.  CT scan done at [facility] long emergency room demonstrates a large left frontal subdural hematoma which appears to be subacute in nature.  There  is marked mass-effect with significant left to right shift.  There is no evidence of obvious acute hemorrhage.  The patient has no history of fever or other signs of infection.  No documented seizure activity Type of Study: Bedside Swallow Evaluation Previous Swallow Assessment: (BSE ordered 9/11(post-extub) pt placed on diet prior to ST ) Diet Prior to this Study: NPO Temperature Spikes Noted: No Respiratory Status: Nasal cannula History of Recent Intubation: Yes Length of Intubations (days): (2) Date extubated: 01/08/19 Behavior/Cognition: Lethargic/Drowsy;Cooperative Oral Cavity Assessment: Dry Oral Care Completed by SLP: Yes Oral Cavity - Dentition: Adequate natural dentition Self-Feeding Abilities: Able to feed self;Needs assist Patient Positioning: Upright in bed Baseline Vocal Quality: Low vocal intensity Volitional Cough: Strong Volitional Swallow: Able to elicit    Oral/Motor/Sensory Function Overall Oral Motor/Sensory Function: Within functional limits   Ice Chips Ice chips: Within functional limits Presentation: Spoon   Thin Liquid Thin Liquid: Impaired Presentation: Cup;Spoon Pharyngeal  Phase Impairments: Cough - Immediate    Nectar Thick Nectar Thick Liquid: Within functional limits Presentation: Cup;Spoon   Honey Thick Honey Thick Liquid: Not tested   Puree Puree: Impaired Presentation: Spoon Pharyngeal Phase Impairments: Multiple swallows;Throat Clearing - Delayed   Solid     Solid: Impaired Presentation: Self Fed Pharyngeal Phase Impairments: Multiple swallows;Throat Clearing - Delayed      Elvina Sidle, M.S., CCC-SLP 01/09/2019,12:01 PM

## 2019-01-09 NOTE — Progress Notes (Signed)
Pharmacy Antibiotic Note  Willie [name] is a 43 y.o. male admitted on 01/05/2019 with infected subdural space s/p subdural hematoma evacuation and bilateral craniotomies on 7/28. Wound cultures (9/9) pending.   Plan: Continue cefepime 2 g q8h Continue metronidazole 500 mg q8 Continue vanc 1500 mg q12h F/U LOT CX Vanc lvls prn  Weight: 165 lb (74.8 kg)  Temp (24hrs), Avg:98.3 F (36.8 C), Min:98.1 F (36.7 C), Max:98.5 F (36.9 C)  Recent Labs  Lab 01/06/19 0002 01/06/19 0441 01/06/19 1220 01/07/19 0442 01/08/19 0400 01/09/19 0437  WBC 9.5  --  9.4  --  10.1 8.2  CREATININE 0.58*  --  0.54* 0.56* 0.53* 0.57*  LATICACIDVEN  --  2.5*  --   --   --   --     Estimated Creatinine Clearance: 127.3 mL/min (A) (by C-G formula based on SCr of 0.57 mg/dL (L)).    Allergies  Allergen Reactions  . Invokamet [Canagliflozin-Metformin Hcl] Other (See Comments)    Lactic acidosis  . Metformin And Related Other (See Comments)    DRASTIC drop in blood sugar   Levester Fresh, PharmD, BCPS, BCCCP Clinical Pharmacist [phone]  Please check AMION for all [place] numbers  01/09/2019 11:08 AM

## 2019-01-09 NOTE — Progress Notes (Signed)
Subjective: Patient reports Feeling better minimal headache  Objective: Vital signs in last 24 hours: Temp:  [97.7 F (36.5 C)-98.6 F (37 C)] 98.1 F (36.7 C) (09/12 0400) Pulse Rate:  [85-117] 91 (09/12 0400) Resp:  [11-41] 11 (09/12 0400) BP: (97-147)/(67-129) 131/88 (09/12 0400) SpO2:  [95 %-100 %] 100 % (09/12 0400) FiO2 (%):  [40 %] 40 % (09/11 0938)  Intake/Output from previous day: 09/11 0701 - 09/12 0700 In: 834.8 [I.V.:284.8; NG/GT:50; IV Piggyback:500] Out: [id number] [Urine:3025; Drains:15] Intake/Output this shift: No intake/output data recorded.  Awake alert no pronator drift  Lab Results: Recent Labs    01/08/19 0400 01/09/19 0437  WBC 10.1 8.2  HGB 9.9* 10.3*  HCT 31.0* 31.6*  PLT 750* 566*   BMET Recent Labs    01/08/19 0400 01/09/19 0437  NA 139 137  K 3.9 3.6  CL 111 106  CO2 21* 22  GLUCOSE 151* 108*  BUN 5* <5*  CREATININE 0.53* 0.57*  CALCIUM 8.0* 8.1*    Studies/Results: Dg Chest Port 1 View  Result Date: 01/08/2019 CLINICAL DATA:  Respiratory failure EXAM: PORTABLE CHEST 1 VIEW COMPARISON:  Portable exam 0535 hours compared to 01/07/2019 FINDINGS: Tip of endotracheal tube projects 2.8 cm above carina. Nasogastric tube extends into stomach. Normal heart size, mediastinal contours, and pulmonary vascularity. Mild RIGHT basilar atelectasis. Remaining lungs clear. No pleural effusion or pneumothorax. Osseous structures unremarkable. IMPRESSION: Mild RIGHT basilar atelectasis. Electronically Signed   By: Lavonia Dana M.D.   On: 01/08/2019 08:30    Assessment/Plan: Continue to mobilize with physical Occupational Therapy drain looks like it is not but not very much will DC it.  LOS: 3 days     Phoua Hoadley P 01/09/2019, 7:24 AM

## 2019-01-09 NOTE — Evaluation (Signed)
Physical Therapy Evaluation Patient Details Name: Willie [name] MRN: [id number] DOB: [birth year] Today's Date: 01/09/2019   History of Present Illness  Pt is a 43 y.o. M with significant PMH of hypertension, type 2 diabetes mellitus, depression, anxiety, history of alcohol abuse. He is 1 month out from bilateral craniotomies for subdural with a recurrent large chronic on the left. Now presents with some altered mental status, came to ER and had a seizure and work up revealed a large chronic subdural hematoma.Reexploration of left frontal craniotomy with evacuation of subdural abscess, placement of subdural drain on 01-06-19    Clinical Impression  Pt agreeable to OOB to recliner - he is congested so encouraged deep breathing.  Pt groggy from meds but physically did well getting up.  Will continue to assess as he gets stronger - to see if he will need any HH services.  PT to work with pt to help him get stronger while in the hospital.  Vital signs remained stable while getting OOB    Follow Up Recommendations No PT follow up;Home health PT;Supervision for mobility/OOB    Equipment Recommendations  None recommended by PT    Recommendations for Other Services       Precautions / Restrictions Precautions Precautions: Fall Precaution Comments: pt groggy but agreeable to OOB to recliner Restrictions Weight Bearing Restrictions: No      Mobility  Bed Mobility Overal bed mobility: Needs Assistance Bed Mobility: Sidelying to Sit   Sidelying to sit: HOB elevated;Min assist       General bed mobility comments: I had to cue pt on what to do - move legs etc but he could physically get legs OOB and needed min assist to get to sitting with HOB up  Transfers Overall transfer level: Needs assistance Equipment used: None Transfers: Sit to/from [facility] Sit to Stand: Mod assist Stand pivot transfers: Mod assist       General transfer comment: Pt stood from higher bed  - able to take 2 steps to the chiar with upright posture with mod to min assist.  pt sat in chair and scoot backwards with cues only  Ambulation/Gait             General Gait Details: unable  Stairs            Wheelchair Mobility    Modified Rankin (Stroke Patients Only)       Balance Overall balance assessment: Mild deficits observed, not formally tested                                           Pertinent Vitals/Pain Pain Assessment: Faces Faces Pain Scale: Hurts a little bit Pain Location: mild headache Pain Descriptors / Indicators: Headache Pain Intervention(s): Limited activity within patient's tolerance;Monitored during session    Home Living Family/patient expects to be discharged to:: Private residence Living Arrangements: Spouse/significant other Available Help at Discharge: Family;Friend(s)                  Prior Function           Comments: pt reports he lives with fiance. when he was home last week - he was walking OK - just weak. No AD.  finance had to help with cooking etc     Hand Dominance        Extremity/Trunk Assessment  Lower Extremity Assessment Lower Extremity Assessment: (Pt would not participate in AROM once in the chiar - but legs strong enought to hold him up)    Cervical / Trunk Assessment Cervical / Trunk Assessment: Normal  Communication   Communication: No difficulties(pt groggy and harder to understand - the more tired he got)  Cognition Arousal/Alertness: Lethargic;Suspect due to medications Behavior During Therapy: Agitated Overall Cognitive Status: Difficult to assess                                 General Comments: pt started out talking better - mumbling but answering questions.  then when in chair - he was tired and no longer answering my questions - VSS      General Comments General comments (skin integrity, edema, etc.): Pt lungs sounded wet and congested.   Got pt up to sit in chair - encouraged deep breathing.  nursing tech assisted    Exercises     Assessment/Plan    PT Assessment Patient needs continued PT services  PT Problem List Decreased mobility;Decreased safety awareness;Decreased activity tolerance;Decreased balance;Decreased knowledge of use of DME;Cardiopulmonary status limiting activity       PT Treatment Interventions Gait training;Stair training;Functional mobility training;Therapeutic activities;Therapeutic exercise;Balance training;Patient/family education    PT Goals (Current goals can be found in the Care Plan section)  Acute Rehab PT Goals Patient Stated Goal: none stated; agreeable to therapy PT Goal Formulation: With patient Time For Goal Achievement: 01/16/19 Potential to Achieve Goals: Good    Frequency Min 3X/week   Barriers to discharge        Co-evaluation               AM-PAC PT "6 Clicks" Mobility  Outcome Measure Help needed turning from your back to your side while in a flat bed without using bedrails?: A Little Help needed moving from lying on your back to sitting on the side of a flat bed without using bedrails?: A Little Help needed moving to and from a bed to a chair (including a wheelchair)?: A Little Help needed standing up from a chair using your arms (e.g., wheelchair or bedside chair)?: A Lot Help needed to walk in hospital room?: Total Help needed climbing 3-5 steps with a railing? : Total 6 Click Score: 13    End of Session Equipment Utilized During Treatment: Gait belt Activity Tolerance: Patient limited by fatigue;Treatment limited secondary to medical complications (Comment) Patient left: in chair;with chair alarm set;with call bell/phone within reach;with nursing/sitter in room Nurse Communication: Mobility status;Precautions PT Visit Diagnosis: Unsteadiness on feet (R26.81);Pain;Other abnormalities of gait and mobility (R26.89);Muscle weakness (generalized) (M62.81)     Time: 1550-1630 PT Time Calculation (min) (ACUTE ONLY): 40 min   Charges:   PT Evaluation $PT Eval Moderate Complexity: 1 Mod PT Treatments $Therapeutic Activity: 8-22 mins        01/09/2019   Rande Lawman, PT   Loyal Buba 01/09/2019, 4:50 PM

## 2019-01-09 NOTE — Progress Notes (Addendum)
NAME:  Willie [name], MRN:  [id number], DOB:  11/20/1975, LOS: 3 ADMISSION DATE:  01/05/2019, CONSULTATION DATE:  01/06/19 REFERRING MD:  Annette Stable, CHIEF COMPLAINT:  Confusion   Brief History   43 year old man with recent SDH s/p evacuation presenting with subdural abscess.  History of present illness   43 year old man with history of alcohol abuse, TBI, recent SDH requiring bilateral craniotomies presenting with worsening confusion and inability to perform ADLs.  Imaging revealed fluid collection on left requiring emergent drainage in OR.  Intraoperative findings consistent with subdural empyema.  Patient left on vent as he has high chance of seizures and inability to protect airway.  PCCM asked to help manage vent.  November 14, 2018 diagnosed with SDH in [place], left [facility] July 28- Nov 28, 2018 admitted here, found to have now bilateral SDH requiring bilateral craniotomies (7/28) and subdural evacuations Aug 22-30, 2020 readmitted for confusion, chronic left subdural on imaging, drain placed and removed 8/25  Past Medical History  Alcohol abuse with history of withdrawals Subdural hematomas as above TBI and baseline combative? DM2, A1c 6.9% 11/23/18  Significant Hospital Events   01/06/19: subdural empyema evacuation, blake drain placement  Consults:  ID PCCM NSGY primary  Procedures:  01/06/19 Dr. Annette Stable  Significant Diagnostic Tests:  01/06/19 CT Head IMPRESSION: Interval significant worsening in the complex extra-axial collection overlying the left frontotemporal lobe measuring 3.4 cm in greatest diameter with 11 mm of left to rightward shift. Would recommend MRI for further evaluation.  These results were called by telephone at the time of interpretation on 01/06/2019 at 3:59 am to provider [facility] , who verbally acknowledged these results.  Micro Data:  01/06/19 Left subdural empyema culture>>> no growth  Antimicrobials:  01/06/19 Vanc>>> 01/06/19 Zosyn>>>9/11 01/06/2019  cleeo>>9/11 01/08/2019 cefepime>>  01/08/2019 Flagyl>>  Interim history/subjective:  He was extubated 01/08/2019.  Transferred to stepdown unit.  Seen on the floor by pulmonary critical care 01/09/2019 we will sign off at this time.  Objective   Blood pressure (!) 130/92, pulse 85, temperature 98.1 F (36.7 C), temperature source Axillary, resp. rate 16, weight 74.8 kg, SpO2 98 %.        Intake/Output Summary (Last 24 hours) at 01/09/2019 1046 Last data filed at 01/09/2019 0400 Gross per 24 hour  Intake 616.4 ml  Output 2790 ml  Net -2173.6 ml   Filed Weights   01/06/19 0408 01/06/19 0425  Weight: 74.8 kg 74.8 kg    Examination: General: Middle-aged male who sedated on full mechanical ventilatory support HEENT: Endotracheal tube gastric tube in place.  Jackson-Pratt drain noted left craniotomy Neuro: Currently sedated does not follow commands. CV: Sounds are regular regular rhythm PULM: Diminished in the base GI: soft, bsx4 active  Extremities: warm/dry, negative edema  Skin: no rashes or lesions    Resolved Hospital Problem list   NA  Assessment & Plan:  Vent dependent respiratory failure in the setting of craniotomy for evacuation of frontal temporal infection with washout on 01/06/2019. Extubated 01/08/2019 No further pulmonary issues at this time Will need further swallowing evaluation  Postoperative drainage of infection frontal temporal of the subdural space on 01/06/2019 per neurosurgery Wound care per neurosurgery Day 3 of antimicrobial therapy currently on Maxipime, Flagyl, vancomycin Monitor culture data no growth to date   Combative behavior with a history of EtOH abuse Off propofol and fentanyl Continue Seroquel Lexapro Klonopin folic acid thiamine aspirin Less combative on 01/09/2019  Diabetes mellitus CBG (last  3)  Recent Labs    01/08/19 2311 01/09/19 0321 01/09/19 0939  GLUCAP 134* 127* 115*   Continue sliding scale insulin protocol  Pulmonary  critical care will sign off at this time  Best practice:  Diet: NPO Pain/Anxiety/Delirium protocol (if indicated): In place VAP protocol (if indicated): ordered 01/06/19 DVT prophylaxis: SCDs GI prophylaxis: PPI Glucose control: SSI 01/08/2019 changed to every 4 hours resistant scale with Levemir discontinued Mobility: BR Code Status: Full Family Communication: Per neurosurgery Disposition: PCCM will  Sign off, please consult TRH for assistance with medical management if needed.  Labs   CBC: Recent Labs  Lab 01/06/19 0002 01/06/19 1220 01/08/19 0400 01/09/19 0437  WBC 9.5 9.4 10.1 8.2  NEUTROABS 7.2  --  7.4 6.5  HGB 11.3* 10.5* 9.9* 10.3*  HCT 36.2* 30.7* 31.0* 31.6*  MCV 91.0 83.0 87.8 86.3  PLT 1,312* 1,053* 750* 566*    Basic Metabolic Panel: Recent Labs  Lab 01/06/19 0002 01/06/19 1220 01/07/19 0009 01/07/19 0442 01/08/19 0400 01/09/19 0437  NA 140 139  --  136 139 137  K 3.7 2.8*  --  4.0 3.9 3.6  CL 104 104  --  107 111 106  CO2 24 23  --  21* 21* 22  GLUCOSE 119* 73  --  159* 151* 108*  BUN <5* <5*  --  <5* 5* <5*  CREATININE 0.58* 0.54*  --  0.56* 0.53* 0.57*  CALCIUM 8.5* 8.1*  --  7.8* 8.0* 8.1*  MG  --   --  2.0 1.9 1.8 1.5*  PHOS  --   --  3.7 4.2 5.2* 4.3   GFR: Estimated Creatinine Clearance: 127.3 mL/min (A) (by C-G formula based on SCr of 0.57 mg/dL (L)). Recent Labs  Lab 01/06/19 0002 01/06/19 0441 01/06/19 1220 01/08/19 0400 01/09/19 0437  WBC 9.5  --  9.4 10.1 8.2  LATICACIDVEN  --  2.5*  --   --   --     Liver Function Tests: Recent Labs  Lab 01/06/19 0401  AST 35  ALT 38  ALKPHOS 193*  BILITOT 0.2*  PROT 7.4  ALBUMIN 2.7*   No results for input(s): LIPASE, AMYLASE in the last 168 hours. No results for input(s): AMMONIA in the last 168 hours.  ABG    Component Value Date/Time   PHART 7.588 (H) 01/06/2019 0925   PCO2ART 26.6 (L) 01/06/2019 0925   PO2ART 258 (H) 01/06/2019 0925   HCO3 25.5 01/06/2019 0925   TCO2 31  11/24/2018 1458   ACIDBASEDEF 3.0 (H) 07/28/2017 1915   O2SAT 98.2 01/06/2019 0925     Coagulation Profile: Recent Labs  Lab 01/06/19 0401  INR 1.2    Cardiac Enzymes: No results for input(s): CKTOTAL, CKMB, CKMBINDEX, TROPONINI in the last 168 hours.  HbA1C: Hgb A1c MFr Bld  Date/Time Value Ref Range Status  11/23/2018 12:45 AM 6.9 (H) 4.8 - 5.6 % Final    Comment:    (NOTE) Pre diabetes:          5.7%-6.4% Diabetes:              >6.4% Glycemic control for   <7.0% adults with diabetes   04/27/2018 05:28 PM 11.9 (H) 4.6 - 6.5 % Final    Comment:    Glycemic Control Guidelines for People with Diabetes:Non Diabetic:  <6%Goal of Therapy: <7%Additional Action Suggested:  >8%     CBG: Recent Labs  Lab 01/08/19 1553 01/08/19 1921 01/08/19 2311 01/09/19 0321 01/09/19 [id number]  GLUCAP 195* [place] CCT 0    Steve Minor ACNP Maryanna Shape PCCM Pager [phone] till 1 pm If no answer page [phone] 01/09/2019, 10:46 AM  Critical care attending attestation note:  Patient seen and examined and relevant ancillary tests reviewed.  I agree with the assessment and plan of care as outlined by S Minor, NP. This patient was seen as a shared visit.   Synopsis of assessment and plan:  Tolerated extubation yesterday.  Now transferred to floor.  Subdural drain removed. Remains calm. Please consult hospitalist service if further questions arise.  Kipp Brood, MD [facility] ICU Physician [place]  Pager: [phone] Mobile: [phone] After hours: [phone].  01/09/2019, 3:33 PM

## 2019-01-10 LAB — GLUCOSE, CAPILLARY
Glucose-Capillary: 163 mg/dL — ABNORMAL HIGH (ref 70–99)
Glucose-Capillary: 160 mg/dL — ABNORMAL HIGH (ref 70–99)
Glucose-Capillary: 120 mg/dL — ABNORMAL HIGH (ref 70–99)
Glucose-Capillary: 270 mg/dL — ABNORMAL HIGH (ref 70–99)
Glucose-Capillary: 144 mg/dL — ABNORMAL HIGH (ref 70–99)
Glucose-Capillary: 213 mg/dL — ABNORMAL HIGH (ref 70–99)

## 2019-01-10 MED ORDER — SODIUM CHLORIDE 0.9% FLUSH: 10.0000 mL | INTRAVENOUS | Status: DC | PRN

## 2019-01-10 MED ORDER — SODIUM CHLORIDE 0.9% FLUSH
10.0000 mL | Freq: Two times a day (BID) | INTRAVENOUS | Status: DC
  Administered 2019-01-10 – 2019-01-13 (×5): 10 mL

## 2019-01-10 NOTE — Progress Notes (Signed)
Subjective: Patient reports feeling better  Objective: Vital signs in last 24 hours: Temp:  [97.6 F (36.4 C)-99.2 F (37.3 C)] 97.6 F (36.4 C) (09/13 0731) Pulse Rate:  [86-98] 86 (09/13 0731) Resp:  [16-18] 16 (09/13 0731) BP: (118-126)/(80-92) 118/80 (09/13 0731) SpO2:  [96 %-99 %] 99 % (09/13 0353)  Intake/Output from previous day: 09/12 0701 - 09/13 0700 In: -  Out: 5000 [Urine:5000] Intake/Output this shift: Total I/O In: -  Out: 200 [Urine:200]  Physical Exam: No weakness or drift.  Dressings CDI.  Lab Results: Recent Labs    01/08/19 0400 01/09/19 0437  WBC 10.1 8.2  HGB 9.9* 10.3*  HCT 31.0* 31.6*  PLT 750* 566*   BMET Recent Labs    01/08/19 0400 01/09/19 0437  NA 139 137  K 3.9 3.6  CL 111 106  CO2 21* 22  GLUCOSE 151* 108*  BUN 5* <5*  CREATININE 0.53* 0.57*  CALCIUM 8.0* 8.1*    Studies/Results: Dg Chest Port 1 View  Result Date: 01/09/2019 CLINICAL DATA:  Respiratory failure. EXAM: PORTABLE CHEST 1 VIEW COMPARISON:  01/08/2019 FINDINGS: Lungs are hypoinflated with worsening hazy airspace density over the right base likely atelectasis. No effusion. Cardiomediastinal silhouette and remainder of the exam is unchanged. IMPRESSION: Worsened opacification right base likely atelectasis. Electronically Signed   By: Marin Olp M.D.   On: 01/09/2019 10:04    Assessment/Plan: Patient is improving.  Will complete ABX upon removal of drain (7 days).    LOS: 4 days    Peggyann Shoals, MD 01/10/2019, 10:57 AM

## 2019-01-10 NOTE — Progress Notes (Signed)
Occupational Therapy Evaluation (late entry)  Pt admitted with the below listed diagnosis, and see below for details of eval.  He was lethargic during eval, therefore, eval somewhat limited.  He was disoriented to situation, and time.  He follows commands consistently.  He has a Rt gaze preference, with Lt inattention.  He requires min A for ADLs and functional transfers.   He reports he lives with fiancee' and was mod I with ADLs PTA. Recommend 24 hour supervision, at least initially and follow up OT (either [place] or OP - preferably OP if fiancee' able to provide transportation.    No DME recommended.     01/09/19 2000  OT Visit Information  Last OT Received On 01/09/19  Assistance Needed +1  History of Present Illness This 43 y.o. male admitted with worsening confusion and agitation.  CT of brain showed Large Lt frontal SDH which appears to be subacute in nature with marked mass effect with significant Lt to Rt shift.  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  PMH includes:  ETOH abuse, alcoholic pancreatitis, depression, DM, HTN, Sleep apnea, h/o back pain   Precautions  Precautions Fall  Restrictions  Weight Bearing Restrictions No  Home Living  Family/patient expects to be discharged to: Private residence  Living Arrangements Spouse/significant other  Available Help at Discharge Family;Friend(s)  Type of [place] to enter  Entrance Stairs-Rails Right  Home Layout One level  Bathroom Child psychotherapist  Additional Comments Pt fatigued and unable to provide accurate info re: living environment - info gleaned from previous admission.  He does report he lives with fiancee' who is currently working from home, and he previously worked as a Freight forwarder for [employer] pt reports he lives with fiance. when he was home last week - he was walking OK - just weak. No AD.  finance had to help with cooking etc  Communication  Communication No difficulties (limited interactions due to fatigue )  Pain Assessment  Pain Assessment No/denies pain  Cognition  Arousal/Alertness Lethargic  Behavior During Therapy [facility] for tasks assessed/performed;Impulsive  Overall Cognitive Status Impaired/Different from baseline  Area of Impairment Orientation;Attention;Following commands;Safety/judgement;Awareness;Problem solving  Orientation Level Disoriented to;Time;Situation  Current Attention Level Focused;Sustained  Following Commands Follows one step commands with increased time;Follows multi-step commands consistently  Safety/Judgement Decreased awareness of safety;Decreased awareness of deficits  Problem Solving Slow processing;Decreased initiation;Difficulty sequencing;Requires verbal cues;Requires tactile cues  General Comments Pt states it's 9/4, and was perplexed when he was told it's 9/12.  He has no idea why he has been admitted.  He is able to tell me has been in the hospital before, but is unaware why he was previously admitted  - states "I may have had an MI"   Upper Extremity Assessment  Upper Extremity Assessment LUE deficits/detail  LUE Deficits / Details mild weakness Lt UE - difficulty with end range ER   Lower Extremity Assessment  Lower Extremity Assessment Defer to PT evaluation  Cervical / Trunk Assessment  Cervical / Trunk Assessment Normal  ADL  Overall ADL's  Needs assistance/impaired  Eating/Feeding Supervision/ safety;Sitting  Grooming Wash/dry hands;Wash/dry face;Oral care;Brushing hair;Minimal assistance;Sitting  Upper Body Bathing Minimal assistance;Sitting  Lower Body Bathing Sit to/from stand;Minimal assistance  Upper Body Dressing  Set up;Supervision/safety;Sitting  Lower Body Dressing Minimal assistance;Sit to/from  Sales promotion account executive;Minimal assistance  Toileting- Clothing Manipulation and Hygiene Sit to/from stand;Minimal assistance  Functional mobility during ADLs Minimal assistance  General ADL Comments Pt requires assist due to lethargy and attention   Vision- History  Baseline Vision/History No visual deficits  Patient Visual Report No change from baseline  Vision- Assessment  Vision Assessment? Yes  Ocular Range of Motion [facility]  Tracking/Visual Pursuits Other (comment) (Pt looses fixation frequently, and does not track on the Lt )  Visual Fields Other (comment)  Additional Comments Pt with Rt gaze preference.  He has difficulty fully participating in visual field assessment, but field appears intact.   Perception  Perception Tested? Yes  Perception Deficits Inattention/neglect  Inattention/Neglect Does not attend to left visual field  Spatial deficits pt is aware of Lt side, but doesn't attend to objects on Lt unless cued   Praxis  Praxis tested? WFL  Bed Mobility  Overal bed mobility Needs Assistance  Bed Mobility Supine to Sit;Sit to Supine  Sidelying to sit Min guard  Supine to sit Min guard  General bed mobility comments min guard for safety   Transfers  Overall transfer level Needs assistance  Equipment used None  Transfers Sit to/from Stand;Stand Pivot Transfers  Sit to Stand Min assist  Stand pivot transfers Min assist  General transfer comment assist for balance   Balance  Overall balance assessment Mild deficits observed, not formally tested  OT - End of Session  Activity Tolerance Patient limited by lethargy  Patient left in bed;with call bell/phone within reach;with bed alarm set  Nurse Communication Mobility status  OT Assessment  OT Recommendation/Assessment Patient needs continued OT Services  OT Visit Diagnosis Unsteadiness on feet (R26.81);Cognitive communication deficit (R41.841)  OT Problem List Decreased strength;Decreased activity  tolerance;Impaired balance (sitting and/or standing);Impaired vision/perception;Decreased cognition;Decreased safety awareness  OT Plan  OT Frequency (ACUTE ONLY) Min 2X/week  OT Treatment/Interventions (ACUTE ONLY) Self-care/ADL training;Neuromuscular education;Therapeutic activities;Cognitive remediation/compensation;Visual/perceptual remediation/compensation;Patient/family education;Balance training  AM-PAC OT "6 Clicks" Daily Activity Outcome Measure (Version 2)  Help from another person eating meals? 3  Help from another person taking care of personal grooming? 3  Help from another person toileting, which includes using toliet, bedpan, or urinal? 3  Help from another person bathing (including washing, rinsing, drying)? 3  Help from another person to put on and taking off regular upper body clothing? 3  Help from another person to put on and taking off regular lower body clothing? 3  6 Click Score 18  OT Recommendation  Follow Up Recommendations Outpatient OT;Home health OT  OT Equipment None recommended by OT  Individuals Consulted  Consulted and Agree with Results and Recommendations Patient  Acute Rehab OT Goals  Patient Stated Goal to go back to sleep   OT Goal Formulation With patient  Time For Goal Achievement 01/24/19  Potential to Achieve Goals Good  OT Time Calculation  OT Start Time (ACUTE ONLY) 1133  OT Stop Time (ACUTE ONLY) 1150  OT Time Calculation (min) 17 min  OT General Charges  $OT Visit 1 Visit  OT Evaluation  $OT Eval Moderate Complexity 1 Mod  Written Expression  Dominant Hand Right  Lucille Passy, OTR/L Acute Rehabilitation Services Pager [phone] Office [phone]

## 2019-01-10 NOTE — Progress Notes (Signed)
  Speech Language Pathology Treatment: Dysphagia  Patient Details Name: Willie [name] MRN: [id number] DOB: 1975/07/15 Today's Date: 01/10/2019 Time: 0945-1000 SLP Time Calculation (min) (ACUTE ONLY): 15 min  Assessment / Plan / Recommendation Clinical Impression  Pt demonstrates much improved mentation. He is alert, able to self feed, is still impulsive. Pt able to consume puree, nectar and regular solids without any difficulty. Also consumed cold un-thickened ensure without any signs of aspriation despite rapid intake. Consistently pt has immediate cough with thin liquids. Advise regular diet, nectar thick liquids and instrumental assessment to determine need for ongoing thickening or helpful strategies. Pt in agreements.   HPI HPI: 43 year old male status post prior bilateral craniotomies for subdural by Dr. Vertell Limber.  Patient with recurrent left-sided subdural hematoma status post drain placement and secondary evacuation by Dr. Saintclair Halsted little over 10 days ago.  The patient now presents with worsening confusion and agitation.  CT scan done at [facility] long emergency room demonstrates a large left frontal subdural hematoma which appears to be subacute in nature.  There is marked mass-effect with significant left to right shift.  There is no evidence of obvious acute hemorrhage.  The patient has no history of fever or other signs of infection.  No documented seizure activity      SLP Plan  MBS       Recommendations  Diet recommendations: Nectar-thick liquid;Regular Liquids provided via: Cup;Straw Medication Administration: Crushed with puree Supervision: Patient able to self feed                Oral Care Recommendations: Oral care BID Follow up Recommendations: Other (comment) SLP Visit Diagnosis: Dysphagia, oropharyngeal phase (R13.12) Plan: MBS       GO               Herbie Baltimore, MA CCC-SLP  Acute Rehabilitation Services Pager [phone] Office [phone]  Lynann Beaver 01/10/2019, 10:28 AM

## 2019-01-10 NOTE — Anesthesia Postprocedure Evaluation (Signed)
Anesthesia Post Note  Patient: Willie [name]  Procedure(s) Performed: CRANIECTOMY (Left Head)     Patient location during evaluation: SICU Anesthesia Type: General Level of consciousness: sedated Pain management: pain level controlled Vital Signs Assessment: post-procedure vital signs reviewed and stable Respiratory status: patient remains intubated per anesthesia plan Cardiovascular status: stable Postop Assessment: no apparent nausea or vomiting Anesthetic complications: no    Last Vitals:  Vitals:   01/10/19 1237 01/10/19 1528  BP: (!) 138/99 105/73  Pulse: 96 (!) 103  Resp: 16 16  Temp: 37.1 C 37.1 C  SpO2: 97% 96%    Last Pain:  Vitals:   01/10/19 1530  TempSrc:   PainSc: 7                  Elward Nocera

## 2019-01-11 DIAGNOSIS — I10 Essential (primary) hypertension: Secondary | ICD-10-CM

## 2019-01-11 DIAGNOSIS — F39 Unspecified mood [affective] disorder: Secondary | ICD-10-CM

## 2019-01-11 DIAGNOSIS — E119 Type 2 diabetes mellitus without complications: Secondary | ICD-10-CM

## 2019-01-11 DIAGNOSIS — Z794 Long term (current) use of insulin: Secondary | ICD-10-CM

## 2019-01-11 LAB — GLUCOSE, CAPILLARY
Glucose-Capillary: 144 mg/dL — ABNORMAL HIGH (ref 70–99)
Glucose-Capillary: 237 mg/dL — ABNORMAL HIGH (ref 70–99)
Glucose-Capillary: 256 mg/dL — ABNORMAL HIGH (ref 70–99)
Glucose-Capillary: 281 mg/dL — ABNORMAL HIGH (ref 70–99)
Glucose-Capillary: 312 mg/dL — ABNORMAL HIGH (ref 70–99)
Glucose-Capillary: 99 mg/dL (ref 70–99)

## 2019-01-11 LAB — TRIGLYCERIDES: Triglycerides: 28 mg/dL (ref <150)

## 2019-01-11 MED ORDER — FOLIC ACID 1 MG PO TABS
1.0000 mg | ORAL_TABLET | Freq: Every day | ORAL | Status: DC
  Administered 2019-01-12 – 2019-01-14 (×3): 1 mg via ORAL
  Filled 2019-01-11 (×3): qty 1

## 2019-01-11 MED ORDER — AMLODIPINE BESYLATE 10 MG PO TABS
10.0000 mg | ORAL_TABLET | Freq: Every day | ORAL | Status: DC
  Administered 2019-01-13 – 2019-01-14 (×2): 10 mg via ORAL
  Filled 2019-01-11 (×3): qty 1

## 2019-01-11 MED ORDER — LEVETIRACETAM 500 MG PO TABS: 500.0000 mg | ORAL_TABLET | Freq: Two times a day (BID) | ORAL | Status: DC

## 2019-01-11 MED ORDER — INSULIN DETEMIR 100 UNIT/ML ~~LOC~~ SOLN
5.0000 [IU] | Freq: Every day | SUBCUTANEOUS | Status: DC
  Administered 2019-01-11: 5 [IU] via SUBCUTANEOUS
  Filled 2019-01-11: qty 0.05

## 2019-01-11 MED ORDER — ENSURE ENLIVE PO LIQD
237.0000 mL | Freq: Two times a day (BID) | ORAL | Status: DC
  Administered 2019-01-11 – 2019-01-14 (×6): 237 mL via ORAL

## 2019-01-11 MED ORDER — PRO-STAT SUGAR FREE PO LIQD: 30.0000 mL | Freq: Two times a day (BID) | ORAL | Status: DC

## 2019-01-11 MED ORDER — ADULT MULTIVITAMIN W/MINERALS CH
1.0000 | ORAL_TABLET | Freq: Every day | ORAL | Status: DC
  Administered 2019-01-12 – 2019-01-14 (×3): 1 via ORAL
  Filled 2019-01-11 (×3): qty 1

## 2019-01-11 MED ORDER — ESCITALOPRAM OXALATE 10 MG PO TABS
20.0000 mg | ORAL_TABLET | Freq: Every day | ORAL | Status: DC
  Administered 2019-01-12 – 2019-01-14 (×3): 20 mg via ORAL
  Filled 2019-01-11 (×3): qty 2

## 2019-01-11 MED ORDER — VITAMIN B-1 100 MG PO TABS
100.0000 mg | ORAL_TABLET | Freq: Every day | ORAL | Status: DC
  Administered 2019-01-12 – 2019-01-14 (×3): 100 mg via ORAL
  Filled 2019-01-11 (×3): qty 1

## 2019-01-11 MED ORDER — MAGNESIUM SULFATE IN D5W 1-5 GM/100ML-% IV SOLN
1.0000 g | Freq: Once | INTRAVENOUS | Status: AC
  Administered 2019-01-11: 1 g via INTRAVENOUS
  Filled 2019-01-11: qty 100

## 2019-01-11 MED ORDER — LABETALOL HCL 5 MG/ML IV SOLN
5.0000 mg | INTRAVENOUS | Status: DC | PRN
  Administered 2019-01-11: 14:00:00 5 mg via INTRAVENOUS

## 2019-01-11 MED ORDER — INSULIN DETEMIR 100 UNIT/ML ~~LOC~~ SOLN
5.0000 [IU] | Freq: Two times a day (BID) | SUBCUTANEOUS | Status: DC
  Administered 2019-01-11 – 2019-01-12 (×2): 5 [IU] via SUBCUTANEOUS
  Filled 2019-01-11 (×3): qty 0.05

## 2019-01-11 MED ORDER — INSULIN ASPART 100 UNIT/ML ~~LOC~~ SOLN
0.0000 [IU] | Freq: Three times a day (TID) | SUBCUTANEOUS | Status: DC
  Administered 2019-01-11: 17:00:00 11 [IU] via SUBCUTANEOUS
  Administered 2019-01-12 (×2): 5 [IU] via SUBCUTANEOUS
  Administered 2019-01-13 – 2019-01-14 (×3): 8 [IU] via SUBCUTANEOUS

## 2019-01-11 MED ORDER — PANTOPRAZOLE SODIUM 40 MG PO TBEC
40.0000 mg | DELAYED_RELEASE_TABLET | Freq: Every day | ORAL | Status: DC
  Filled 2019-01-11: qty 1

## 2019-01-11 MED ORDER — PANTOPRAZOLE SODIUM 40 MG PO PACK
40.0000 mg | PACK | Freq: Every day | ORAL | Status: DC
  Administered 2019-01-11 – 2019-01-13 (×3): 40 mg via ORAL
  Filled 2019-01-11 (×3): qty 20

## 2019-01-11 MED ORDER — QUETIAPINE FUMARATE 50 MG PO TABS
300.0000 mg | ORAL_TABLET | Freq: Every day | ORAL | Status: DC
  Administered 2019-01-12 – 2019-01-13 (×2): 300 mg via ORAL
  Filled 2019-01-11 (×2): qty 6

## 2019-01-11 MED ORDER — INSULIN ASPART 100 UNIT/ML ~~LOC~~ SOLN
0.0000 [IU] | Freq: Every day | SUBCUTANEOUS | Status: DC
  Administered 2019-01-11 – 2019-01-12 (×2): 2 [IU] via SUBCUTANEOUS

## 2019-01-11 MED ORDER — LEVETIRACETAM 100 MG/ML PO SOLN
500.0000 mg | Freq: Two times a day (BID) | ORAL | Status: DC
  Administered 2019-01-11 – 2019-01-14 (×6): 500 mg via ORAL
  Filled 2019-01-11 (×7): qty 5

## 2019-01-11 NOTE — Progress Notes (Addendum)
Subjective: Patient reports "right in here (motioning to incision) hurts a little"  Objective: Vital signs in last 24 hours: Temp:  [98.7 F (37.1 C)-99.6 F (37.6 C)] 98.7 F (37.1 C) (09/14 0353) Pulse Rate:  [95-107] 95 (09/14 0353) Resp:  [16-18] 18 (09/14 0353) BP: (105-138)/(73-99) 131/89 (09/14 0353) SpO2:  [96 %-99 %] 99 % (09/14 0353)  Intake/Output from previous day: 09/13 0701 - 09/14 0700 In: 10 [I.V.:10] Out: 2600 [Urine:2600] Intake/Output this shift: No intake/output data recorded.  Awake, cooperative. Responds readily to questions. Rambling conversation, anxious re: fiance, bills, unable to utilize his phone.  Incision without erythema, swelling, or drainage. Staples intact. Drain out.  PEARL. MAEW. No drift.  Lab Results: Recent Labs    01/09/19 0437  WBC 8.2  HGB 10.3*  HCT 31.6*  PLT 566*   BMET Recent Labs    01/09/19 0437  NA 137  K 3.6  CL 106  CO2 22  GLUCOSE 108*  BUN <5*  CREATININE 0.57*  CALCIUM 8.1*    Studies/Results: Dg Chest Port 1 View  Result Date: 01/09/2019 CLINICAL DATA:  Respiratory failure. EXAM: PORTABLE CHEST 1 VIEW COMPARISON:  01/08/2019 FINDINGS: Lungs are hypoinflated with worsening hazy airspace density over the right base likely atelectasis. No effusion. Cardiomediastinal silhouette and remainder of the exam is unchanged. IMPRESSION: Worsened opacification right base likely atelectasis. Electronically Signed   By: Marin Olp M.D.   On: 01/09/2019 10:04    Assessment/Plan: improving  LOS: 5 days  Continue supportive care. Mobilize with therapies.   Verdis Prime 01/11/2019, 7:47 AM  Will ask [facility] to resume care for medical issues.

## 2019-01-11 NOTE — Consult Note (Addendum)
Medical Consultation   Willie [name]  YDX:[id number]  DOB: 1976/03/09  DOA: 01/05/2019  PCP: Debbrah Alar, NP     Requesting physician: Dr. Vertell Limber  Reason for consultation: Medical management   History of Present Illness: Willie [name] is an 43 y.o. male with past medical history significant for IDDM, HTN, HLD, EtOH abuse, recurrent EtOH pancreatitis, anxiety, depression, GERD, and subdural hematoma; who presented with altered mental status on 9/8.  Patient had initially sustained a subdural hematoma on July 18 in [place], but left AMA.  Then admitted from July 28 through August 1 requiring bilateral craniotomies by Dr. Vertell Limber.  He was readmitted into the hospital from August 22-30 mile for confusion found to [facility] recurrent left-sided subdural hematoma for which a subdural drain was placed and then removed on 8/25.  Initial imaging studies during this admission revealed fluid collection of the front temporal lobe brain requiring emergent drainage in the OR.  Intraoperative findings consistent with a subdural empyema.  Patient was in the ICU under the care of critical care, but was able to be extubated on 9/11 and transferred to the floor.  Speech therapy evaluate the patient and he was initially placed on a dysphasia diet but able to be transitioned to a regular diet with nectar thick liquids.  Patient has been treated with Maxipime, Flagyl, and vancomycin.   Review of Systems:  Review of Systems  Neurological: Positive for speech change and headaches.  All other systems reviewed and are negative.  As per HPI otherwise 10 point review of systems negative.     Past Medical History: Past Medical History:  Diagnosis Date  . Alcohol abuse   . Alcoholic pancreatitis    recurrent  . Depression   . Diabetes mellitus, type II ([place])    New Onset 03/2010  . GERD (gastroesophageal reflux disease)   . High cholesterol   . History of low back pain    with  herniated disc L5 S1 with right lumbar radiculopathy  . Hypertension   . Sleep apnea    does not wear a CPAP    Past Surgical History: Past Surgical History:  Procedure Laterality Date  . COLONOSCOPY W/ BIOPSIES  09/2010   Dr. Ardis Hughs.  for intermittent rectal bleeding.  Mild sigmoid to descending diverticulosis.  Mild left colon erythema, benign biopsy, probably "prep effect"  . COLONOSCOPY WITH PROPOFOL N/A 07/06/2017   Procedure: COLONOSCOPY WITH PROPOFOL;  Surgeon: Jerene Bears, MD;  Location: [facility];  Service: Gastroenterology;  Laterality: N/A;  . CRANIOTOMY Bilateral 11/24/2018   Procedure: Bilateral craniotomy for evacuation of subdural hematoma;  Surgeon: Erline Levine, MD;  Location: [place];  Service: Neurosurgery;  Laterality: Bilateral;  Bilateral craniotomy for evacuation of subdural hematoma  . CRANIOTOMY Left 01/06/2019   Procedure: CRANIECTOMY;  Surgeon: Earnie Larsson, MD;  Location: [place];  Service: Neurosurgery;  Laterality: Left;  . LUMBAR MICRODISCECTOMY  ~ 2004   Dr Ellene Route     Allergies:   Allergies  Allergen Reactions  . Invokamet [Canagliflozin-Metformin Hcl] Other (See Comments)    Lactic acidosis  . Metformin And Related Other (See Comments)    DRASTIC drop in blood sugar     Social History:  reports that he has quit smoking. His smoking use included cigarettes. He started smoking about 2 years ago. He has a 0.25 pack-year smoking history. He has never used smokeless tobacco. He  reports current alcohol use. He reports previous drug use.   Family History: Family History  Problem Relation Age of Onset  . Diabetes Mother   . Lung cancer Brother        twin brother  . Pancreatic cancer Paternal Aunt   . Colon cancer Neg Hx   . Stomach cancer Neg Hx     Unacceptable: Noncontributory, unremarkable, or negative. Acceptable: Family history reviewed and not pertinent (If you reviewed it)   Physical Exam: Vitals:   01/10/19 1947 01/10/19 2320  01/11/19 0353 01/11/19 0838  BP: (!) 131/95 107/77 131/89 (!) 146/100  Pulse: 100 (!) 107 95 (!) 106  Resp: _0 Temp: 99.6 F (37.6 C) 98.7 F (37.1 C) 98.7 F (37.1 C) 99.9 F (37.7 C)  TempSrc: Oral Oral Oral Oral  SpO2: 97% 96% 99% 98%  Weight:        Constitutional: Middle-age male who appears to be in no acute distress. Eyes: PERLA, EOMI, irises appear normal, anicteric sclera,  ENMT: external ears and nose appear normal,  Neck: neck appears normal, no masses, normal ROM, no thyromegaly, no JVD  CVS: S1-S2 clear, no murmur rubs or gallops, no LE edema, normal pedal pulses  Respiratory:  clear to auscultation bilaterally, no wheezing, rales or rhonchi. Respiratory effort normal. No accessory muscle use.  Abdomen: soft nontender, nondistended, normal bowel sounds, no hepatosplenomegaly, no hernias  Musculoskeletal: : no cyanosis, clubbing or edema noted bilaterally Neuro: Cranial nerves II-XII intact, with expressive aphasia noted. Psych: judgement and insight appear normal, stable mood and affect Skin: Status post craniotomy of the left temple region with staples in place.  No significant erythema noted.  Data reviewed:  I have personally reviewed following labs and imaging studies Labs:  CBC: Recent Labs  Lab 01/06/19 0002 01/06/19 1220 01/08/19 0400 01/09/19 0437  WBC 9.5 9.4 10.1 8.2  NEUTROABS 7.2  --  7.4 6.5  HGB 11.3* 10.5* 9.9* 10.3*  HCT 36.2* 30.7* 31.0* 31.6*  MCV 91.0 83.0 87.8 86.3  PLT 1,312* 1,053* 750* 566*    Basic Metabolic Panel: Recent Labs  Lab 01/06/19 0002 01/06/19 1220 01/07/19 0009 01/07/19 0442 01/08/19 0400 01/09/19 0437  NA 140 139  --  136 139 137  K 3.7 2.8*  --  4.0 3.9 3.6  CL 104 104  --  107 111 106  CO2 24 23  --  21* 21* 22  GLUCOSE 119* 73  --  159* 151* 108*  BUN <5* <5*  --  <5* 5* <5*  CREATININE 0.58* 0.54*  --  0.56* 0.53* 0.57*  CALCIUM 8.5* 8.1*  --  7.8* 8.0* 8.1*  MG  --   --  2.0 1.9 1.8 1.5*   PHOS  --   --  3.7 4.2 5.2* 4.3   GFR Estimated Creatinine Clearance: 127.3 mL/min (A) (by C-G formula based on SCr of 0.57 mg/dL (L)). Liver Function Tests: Recent Labs  Lab 01/06/19 0401  AST 35  ALT 38  ALKPHOS 193*  BILITOT 0.2*  PROT 7.4  ALBUMIN 2.7*   No results for input(s): LIPASE, AMYLASE in the last 168 hours. No results for input(s): AMMONIA in the last 168 hours. Coagulation profile Recent Labs  Lab 01/06/19 0401  INR 1.2    Cardiac Enzymes: No results for input(s): CKTOTAL, CKMB, CKMBINDEX, TROPONINI in the last 168 hours. BNP: Invalid input(s): POCBNP CBG: Recent Labs  Lab 01/10/19 1642 01/10/19 1927 01/11/19 0036 01/11/19 0350 01/11/19 4268  GLUCAP 144* 213* 237* 99 144*   D-Dimer No results for input(s): DDIMER in the last 72 hours. Hgb A1c No results for input(s): HGBA1C in the last 72 hours. Lipid Profile Recent Labs    01/11/19 0648  TRIG 28   Thyroid function studies No results for input(s): TSH, T4TOTAL, T3FREE, THYROIDAB in the last 72 hours.  Invalid input(s): FREET3 Anemia work up No results for input(s): VITAMINB12, FOLATE, FERRITIN, TIBC, IRON, RETICCTPCT in the last 72 hours. Urinalysis    Component Value Date/Time   COLORURINE YELLOW 09/11/2018 1750   APPEARANCEUR CLEAR 09/11/2018 1750   LABSPEC 1.025 09/11/2018 1750   PHURINE 5.5 09/11/2018 1750   GLUCOSEU >=500 (A) 09/11/2018 1750   HGBUR NEGATIVE 09/11/2018 1750   BILIRUBINUR NEGATIVE 09/11/2018 1750   KETONESUR NEGATIVE 09/11/2018 1750   PROTEINUR NEGATIVE 09/11/2018 1750   UROBILINOGEN 0.2 11/17/2014 1315   NITRITE NEGATIVE 09/11/2018 [place] 09/11/2018 1750     Microbiology Recent Results (from the past 240 hour(s))  SARS Coronavirus 2 [facility] order, Performed in [place] hospital lab)     Status: None   Collection Time: 01/06/19  4:04 AM  Result Value Ref Range Status   SARS Coronavirus 2 NEGATIVE NEGATIVE Final    Comment:  (NOTE) If result is NEGATIVE SARS-CoV-2 target nucleic acids are NOT DETECTED. The SARS-CoV-2 RNA is generally detectable in upper and lower  respiratory specimens during the acute phase of infection. The lowest  concentration of SARS-CoV-2 viral copies this assay can detect is 250  copies / mL. A negative result does not preclude SARS-CoV-2 infection  and should not be used as the sole basis for treatment or other  patient management decisions.  A negative result may occur with  improper specimen collection / handling, submission of specimen other  than nasopharyngeal swab, presence of viral mutation(s) within the  areas targeted by this assay, and inadequate number of viral copies  (<250 copies / mL). A negative result must be combined with clinical  observations, patient history, and epidemiological information. If result is POSITIVE SARS-CoV-2 target nucleic acids are DETECTED. The SARS-CoV-2 RNA is generally detectable in upper and lower  respiratory specimens dur ing the acute phase of infection.  Positive  results are indicative of active infection with SARS-CoV-2.  Clinical  correlation with patient history and other diagnostic information is  necessary to determine patient infection status.  Positive results do  not rule out bacterial infection or co-infection with other viruses. If result is PRESUMPTIVE POSTIVE SARS-CoV-2 nucleic acids MAY BE PRESENT.   A presumptive positive result was obtained on the submitted specimen  and confirmed on repeat testing.  While 2019 novel coronavirus  (SARS-CoV-2) nucleic acids may be present in the submitted sample  additional confirmatory testing may be necessary for epidemiological  and / or clinical management purposes  to differentiate between  SARS-CoV-2 and other Sarbecovirus currently known to infect humans.  If clinically indicated additional testing with an alternate test  methodology [phone]) is advised. The SARS-CoV-2 RNA is  generally  detectable in upper and lower respiratory sp ecimens during the acute  phase of infection. The expected result is Negative. Fact Sheet for Patients:  StrictlyIdeas.no Fact Sheet for Healthcare Providers: BankingDealers.co.za This test is not yet approved or cleared by the [place] FDA and has been authorized for detection and/or diagnosis of SARS-CoV-2 by FDA under an Emergency Use Authorization (EUA).  This EUA will remain in effect (meaning this test can  be used) for the duration of the COVID-19 declaration under Section 564(b)(1) of the Act, 21 U.S.C. section 360bbb-3(b)(1), unless the authorization is terminated or revoked sooner. Performed at [facility], [place] [address] [place], [place] [address]   Aerobic Culture (superficial specimen)     Status: None   Collection Time: 01/06/19  6:36 AM   Specimen: Wound; Tissue  Result Value Ref Range Status   Specimen Description ABSCESS  Final   Special Requests LEFT SUBDURAL HEMATOMA  Final   Gram Stain   Final    RARE WBC PRESENT, PREDOMINANTLY PMN NO ORGANISMS SEEN    Culture   Final    NO GROWTH 2 DAYS Performed at [facility], [address] [place], [place] [address]    Report Status 01/08/2019 FINAL  Final  MRSA PCR Screening     Status: None   Collection Time: 01/06/19 12:01 PM   Specimen: Nasal Mucosa; Nasopharyngeal  Result Value Ref Range Status   MRSA by PCR NEGATIVE NEGATIVE Final    Comment:        The GeneXpert MRSA Assay (FDA approved for NASAL specimens only), is one component of a comprehensive MRSA colonization surveillance program. It is not intended to diagnose MRSA infection nor to guide or monitor treatment for MRSA infections. Performed at [facility], [place] [address], [place], [place] [address]        Inpatient Medications:   Scheduled Meds: . sodium chloride   Intravenous Once  . sodium  chloride   Intravenous Once  . amLODipine  10 mg Per Tube Daily  . chlorhexidine gluconate (MEDLINE KIT)  15 mL Mouth Rinse BID  . Chlorhexidine Gluconate Cloth  6 each Topical Daily  . clonazePAM  1 mg Oral BID  . escitalopram  20 mg Per Tube Daily  . feeding supplement (PRO-STAT SUGAR FREE 64)  30 mL Per Tube BID  . folic acid  1 mg Intravenous Daily  . insulin aspart  0-20 Units Subcutaneous Q4H  . mouth rinse  15 mL Mouth Rinse BID  . multivitamin with minerals  1 tablet Per Tube Daily  . pantoprazole (PROTONIX) IV  40 mg Intravenous QHS  . QUEtiapine  100 mg Oral BID  . QUEtiapine  300 mg Per Tube QHS  . sodium chloride flush  10-40 mL Intracatheter Q12H  . thiamine injection  100 mg Intravenous Daily   Continuous Infusions: . sodium chloride 100 mL/hr at 01/09/19 0335  . ceFEPime (MAXIPIME) IV 2 g (01/11/19 0549)  . dextrose 5 % and 0.9% NaCl Stopped (01/08/19 0831)  . feeding supplement (VITAL AF 1.2 CAL) Stopped (01/08/19 0800)  . levETIRAcetam 500 mg (01/11/19 0048)  . metronidazole 500 mg (01/11/19 0427)  . vancomycin 1,500 mg (01/11/19 0541)     Radiological Exams on Admission: No results found.  Impression/Recommendations Subdural empyema: Patient status post operative drainage of the frontal temporal lobe on 9/9 per neurosurgery.  Cultures show  no growth day 2. -Wound care per neurosurgery -Continue to follow-up culture -Continue antibiotics of Maxipime, Flagyl, and vancomycin day 5  -Continue PT/OT -Continue seizure prophylaxis of Keppra and changed from IV to p.o.  Dysphagia and expressive aphasia:Speech therapy diet to regular with nectar thick liquids and medications crushed with pure. -Aspiration precaution -Appreciate speech therapy, we will follow for additional recommendations   Diabetes mellitus type 2: Patient's last hemoglobin A1c noted to be 6.9 on 11/23/2018.  Patient initially had been on CBGs every  4 hours with resistant sliding scale insulin.  -Carb modified diet -Hypoglycemic protocols  -Start Levemir 5 units twice daily -CBGs q. before meals with with moderate SSI -Adjust insulin regimen as needed  Normocytic anemia: Hemoglobin 10.3 last on 9/12. -Recheck CBC in a.m.  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 -Continue Seroquel, Lexapro, Klonopin, folic acid, and thiamine  Thank you for this consultation.  Our [facility] hospitalist team will follow the patient with you.   Time Spent:   Norval Morton M.D. [facility] 01/11/2019, 9:02 AM

## 2019-01-11 NOTE — TOC Initial Note (Signed)
Transition of Care [facility]) - Initial/Assessment Note    Patient Details  Name: Willie [name] MRN: [id number] Date of Birth: 11/06/1975  Transition of Care [facility] CM/SW Contact:    Pollie Friar, RN Phone Number: 01/11/2019, 2:28 PM  Clinical Narrative:                 Recommendations are for [facility] or Outpatient therapy. Pt prefers [place] services. MD please place order for [facility] as needed.  Pt expresses that he has issues affording his home medications. He is interested in seeing a MD from [facility] and using [facility] pharmacy for medication assistance. He denies any transportation barriers.  TOC will follow and get him an appt closer to d/c.   Expected Discharge Plan: [place] Barriers to Discharge: Continued Medical Work up, Inadequate or no insurance   Patient Goals and CMS Choice   CMS Medicare.gov Compare Post Acute Care list provided to:: Patient Choice offered to / list presented to : Patient  Expected Discharge Plan and Services Expected Discharge Plan: [place]   Discharge Planning Services: CM Consult Post Acute Care Choice: Home Health                             HH Arranged: PT, OT          Prior Living Arrangements/Services   Lives with:: Significant Other(fiance) Patient language and need for interpreter reviewed:: Yes(no needs) Do you feel safe going back to the place where you live?: Yes      Need for Family Participation in Patient Care: Yes (Comment)(supervision when OOB) Care giver support system in place?: Yes (comment)(fiance is able to provide needed supervision)   Criminal Activity/Legal Involvement Pertinent to Current Situation/Hospitalization: No - Comment as needed  Activities of Daily Living      Permission Sought/Granted                  Emotional Assessment Appearance:: Appears stated age Attitude/Demeanor/Rapport: Engaged Affect (typically observed): Accepting, Pleasant Orientation: : Oriented to  Place, Oriented to Self, Oriented to Situation   Psych Involvement: No (comment)  Admission diagnosis:  Acute on chronic intracranial subdural hematoma (HCC) [I62.01, I62.03] Patient Active Problem List   Diagnosis Date Noted  . Malnutrition of moderate degree 01/09/2019  . 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 Subdural bleeding ([place]) 11/24/2018  . Abdominal pain 11/22/2018  . SDH (subdural hematoma) ([place]) 11/22/2018  . Abnormal LFTs 11/22/2018  . IDDM (insulin dependent diabetes mellitus) ([place]) 09/12/2018  . Acute hepatitis 09/11/2018  . Alcohol-induced mood disorder ([place])   . DKA (diabetic ketoacidoses) ([place]) 07/28/2017  . Alcohol withdrawal ([place]) 07/28/2017  . Abnormal CT scan, colon   . Benign neoplasm of descending colon   . Lower GI bleed 07/04/2017  . Transaminitis 07/04/2017  . Pain   . Bleeding internal hemorrhoids   . Anemia of chronic disease   . Left arm weakness   . Anemia, blood loss   . Vomiting alone 03/27/2017  . Chest pain 03/27/2017  . Nausea and vomiting   . Lactic acidosis 03/26/2017  . History of alcohol abuse 06/25/2016  . Tobacco abuse 06/25/2016  . Anxiety state 11/20/2015  . Dental infection 07/10/2015  . Mood disorder ([place]) 04/18/2015  . Eczema 06/27/2014  . GERD (gastroesophageal reflux disease) 05/24/2014  . Snoring 04/07/2013  . Suicide attempt by substance overdose ([place]) 02/10/2013  . Alcohol abuse 12/31/2012  .  Erectile dysfunction 12/09/2012  . Hematuria 06/11/2012  . Vision problem 02/02/2012  . Insomnia 12/20/2011  . Chronic pancreatitis ([place]) 02/05/2011  . Depression with anxiety 11/13/2010  . Hyperlipemia 06/27/2010  . Diabetes type 2, uncontrolled ([place]) 05/22/2010  . Essential hypertension 05/22/2010   PCP:  Debbrah Alar, NP Pharmacy:   [facility] [place], [place] - [address] [place] [address] Phone: [phone] Fax: [phone]  [id number] - [place], [place] - [address] AT [place] [address] [place] [address] Phone: [phone] Fax: [phone]     Social Determinants of Health (SDOH) Interventions    Readmission Risk Interventions Readmission Risk Prevention Plan 09/14/2018  Transportation Screening Complete  PCP or Specialist Appt within 3-5 Days Not Complete  Not Complete comments no yet ready for d/c  HRI or [place] Not Complete  HRI or Home Care Consult comments no needs identified at this time  Social Work Consult for [place] Planning/Counseling Not Complete  SW consult not completed comments no needs identified at this time  Palliative Care Screening Not Applicable  Medication Review Press photographer) Complete  Some recent data might be hidden

## 2019-01-11 NOTE — Progress Notes (Signed)
Occupational Therapy Treatment Patient Details Name: Willie [name] MRN: [id number] DOB: 16-Dec-1975 Today's Date: 01/11/2019    History of present illness This 43 y.o. male admitted with worsening confusion and agitation.  CT of brain showed Large Lt frontal SDH which appears to be subacute in nature with marked mass effect with significant Lt to Rt shift.  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  PMH includes:  ETOH abuse, alcoholic pancreatitis, depression, DM, HTN, Sleep apnea, h/o back pain    OT comments  Pt progressing well today standing at sink for grooming and able to visually scan environment for toothpaste and toothbrush. Pt continues to be less aware of deficits, but pt overall minguardA to minA for ADL and minguardA mobility. Pt performing own toilet hygiene with set-upA/supervisionA in standing at toilet. Pt ambulating in room 8' with no LOB episodes. Pt more aware of current situation when looking in mirror at staples on head. Pt performing visual scanning tasks, WFLs- peripheral vision to be re-assessed as pt lost interest in session. Pt progressing and continues to require 24/7 at home and OP OT vs HHOT. OT following acutely.       Follow Up Recommendations  Outpatient OT;Home health OT;Supervision/Assistance - 24 hour(initially 24/7)    Equipment Recommendations  None recommended by OT    Recommendations for Other Services      Precautions / Restrictions Precautions Precautions: Fall Restrictions Weight Bearing Restrictions: No       Mobility Bed Mobility Overal bed mobility: Needs Assistance Bed Mobility: Supine to Sit;Sit to Supine   Sidelying to sit: Supervision Supine to sit: Supervision     General bed mobility comments: supervisionA for safety  Transfers Overall transfer level: Needs assistance Equipment used: None Transfers: Sit to/from  Stand Sit to Stand: Min guard         General transfer comment: assist for balance     Balance Overall balance assessment: Mild deficits observed, not formally tested                                         ADL either performed or assessed with clinical judgement   ADL Overall ADL's : Needs assistance/impaired     Grooming: Min guard;Standing       Lower Body Bathing: Min guard;Sitting/lateral leans;Sit to/from stand;Cueing for safety   Upper Body Dressing : Set up;Supervision/safety;Sitting   Lower Body Dressing: Minimal assistance;Sitting/lateral leans;Sit to/from stand   Toilet Transfer: Min guard;Ambulation;Comfort height toilet;Grab bars   Toileting- Clothing Manipulation and Hygiene: Min guard;Sitting/lateral lean;Sit to/from stand;Cueing for safety Toileting - Clothing Manipulation Details (indicate cue type and reason): stood at commode for own toilet hygiene     Functional mobility during ADLs: Min guard General ADL Comments: Pt able to focus throughout session     Vision   Vision Assessment?: Vision impaired- to be further tested in functional context Ocular Range of Motion: Within Functional Limits Tracking/Visual Pursuits: Able to track stimulus in all quads without difficulty Visual Fields: No apparent deficits Additional Comments: Pt turning head to look to L today and unable to test peripheral as pt losing interest in session   Perception     Praxis      Cognition Arousal/Alertness: Lethargic Behavior During Therapy: [facility] for tasks assessed/performed;Impulsive Overall Cognitive Status: Impaired/Different from baseline  Area of Impairment: Safety/judgement;Awareness;Memory                 Orientation Level: Disoriented to;Situation Current Attention Level: Focused   Following Commands: Follows one step commands with increased time;Follows multi-step commands with increased time Safety/Judgement: Decreased awareness of  deficits   Problem Solving: Requires verbal cues;Slow processing General Comments: Pt looked in mirror and able to remember that he had brain surgery        Exercises     Shoulder Instructions       General Comments Appears to be having a great day today compared to eval yesterday,    Pertinent Vitals/ Pain       Pain Assessment: No/denies pain  Home Living                                          Prior Functioning/Environment              Frequency  Min 2X/week        Progress Toward Goals  OT Goals(current goals can now be found in the care plan section)  Progress towards OT goals: Progressing toward goals  Acute Rehab OT Goals Patient Stated Goal: to go home OT Goal Formulation: With patient Time For Goal Achievement: 01/24/19 Potential to Achieve Goals: Good  Plan Discharge plan remains appropriate    Co-evaluation                 AM-PAC OT "6 Clicks" Daily Activity     Outcome Measure   Help from another person eating meals?: A Little Help from another person taking care of personal grooming?: A Little Help from another person toileting, which includes using toliet, bedpan, or urinal?: A Little Help from another person bathing (including washing, rinsing, drying)?: A Little Help from another person to put on and taking off regular upper body clothing?: None Help from another person to put on and taking off regular lower body clothing?: A Little 6 Click Score: 19    End of Session Equipment Utilized During Treatment: Gait belt  OT Visit Diagnosis: Unsteadiness on feet (R26.81);Cognitive communication deficit (R41.841) Symptoms and signs involving cognitive functions: Cerebral infarction   Activity Tolerance Patient tolerated treatment well   Patient Left in chair;with call bell/phone within reach;with chair alarm set   Nurse Communication Mobility status        Time: [id number] OT Time Calculation (min): 31  min  Charges: OT General Charges $OT Visit: 1 Visit OT Treatments $Self Care/Home Management : 8-22 mins $Neuromuscular Re-education: 8-22 mins  Darryl Nestle) Marsa Aris OTR/L Acute Rehabilitation Services Pager: [phone] Office: [phone]    Audie Pinto 01/11/2019, 4:31 PM

## 2019-01-11 NOTE — Progress Notes (Signed)
PT Cancellation Note  Patient Details Name: CHORD MCCURTY MRN: [id number] DOB: Jun 25, 1975   Cancelled Treatment:    Reason Eval/Treat Not Completed: Other (comment) have made multiple attempts to work with patient today- on first attempt he was eating breakfast, on second attempt EKG tech in room and working with patient. Will attempt to return if time/schedule allow, otherwise will attempt on next day of service.     Deniece Ree PT, DPT, CBIS  Supplemental Physical Therapist [facility]    Pager [phone] Acute Rehab Office [phone]

## 2019-01-11 NOTE — Progress Notes (Signed)
Nutrition Follow-up  DOCUMENTATION CODES:   Non-severe (moderate) malnutrition in context of chronic illness  INTERVENTION:  Provide Ensure Enlive po BID, each supplement provides 350 kcal and 20 grams of protein  Encourage adequate PO intake.   NUTRITION DIAGNOSIS:   Moderate Malnutrition related to chronic illness(chronic pancreaitis, EtOH abuse, SDH) as evidenced by mild fat depletion, mild muscle depletion, moderate muscle depletion; ongoing  GOAL:   Patient will meet greater than or equal to 90% of their needs; progressing  MONITOR:   PO intake, Supplement acceptance, Skin, Weight trends, Labs, I & O's  REASON FOR ASSESSMENT:   Consult, Ventilator Enteral/tube feeding initiation and management  ASSESSMENT:   43 yo male with recent SDH s/p evacuation admitted on 9/09 with subdural abscess. PMH includes recent SDH requiring b/l craniotomoies, EtOH abuse with chronic EtOH pancreatitis, DM, HTN, HLD, depression, anxiety   9/09 Left postop sudural abcess s/p reexploration of frontal craniotomy with evacuation of abscess, placement of subdural drain  Extubated 9/11. Pt is currently on a carbohydrate modified diet with nectar thick liquids. Meal completion has been 75%. RD to order nutritional supplements to aid in caloric and protein needs. Pt encouraged to eat his food at meals and to consume his supplements.   Labs and medications reviewed.  Diet Order:   Diet Order            Diet Carb Modified Fluid consistency: Nectar Thick; Room service appropriate? No  Diet effective now              EDUCATION NEEDS:   Not appropriate for education at this time  Skin:  Skin Assessment: Skin Integrity Issues: Skin Integrity Issues:: Incisions Incisions: head  Last BM:  9/14  Height:   Ht Readings from Last 1 Encounters:  12/19/18 6' (1.829 m)    Weight:   Wt Readings from Last 1 Encounters:  01/06/19 74.8 kg    Ideal Body Weight:  80.9 kg  BMI:  Body mass  index is 22.38 kg/m.  Estimated Nutritional Needs:   Kcal:  2100-2300  Protein:  110-125 grams  Fluid:  >/= 2 L    Corrin Parker, MS, RD, LDN Pager # [phone] After hours/ weekend pager # [phone]

## 2019-01-12 ENCOUNTER — Inpatient Hospital Stay (HOSPITAL_COMMUNITY): Payer: Self-pay

## 2019-01-12 LAB — CBC WITH DIFFERENTIAL/PLATELET
Abs Immature Granulocytes: 0.02 10*3/uL (ref 0.00–0.07)
Basophils Absolute: 0.1 10*3/uL (ref 0.0–0.1)
Basophils Relative: 1 %
Eosinophils Absolute: 0.3 10*3/uL (ref 0.0–0.5)
Eosinophils Relative: 4 %
HCT: 28.9 % — ABNORMAL LOW (ref 39.0–52.0)
Hemoglobin: 9.8 g/dL — ABNORMAL LOW (ref 13.0–17.0)
Immature Granulocytes: 0 %
Lymphocytes Relative: 19 %
Lymphs Abs: 1.4 10*3/uL (ref 0.7–4.0)
MCH: 28.9 pg (ref 26.0–34.0)
MCHC: 33.9 g/dL (ref 30.0–36.0)
MCV: 85.3 fL (ref 80.0–100.0)
Monocytes Absolute: 0.7 10*3/uL (ref 0.1–1.0)
Monocytes Relative: 10 %
Neutro Abs: 5 10*3/uL (ref 1.7–7.7)
Neutrophils Relative %: 66 %
Platelets: 599 10*3/uL — ABNORMAL HIGH (ref 150–400)
RBC: 3.39 MIL/uL — ABNORMAL LOW (ref 4.22–5.81)
RDW: 12.8 % (ref 11.5–15.5)
WBC: 7.5 10*3/uL (ref 4.0–10.5)
nRBC: 0 % (ref 0.0–0.2)

## 2019-01-12 LAB — BASIC METABOLIC PANEL
Anion gap: 10 (ref 5–15)
BUN: <5 mg/dL — ABNORMAL LOW (ref 6–20)
CO2: 28 mmol/L (ref 22–32)
Calcium: 8.5 mg/dL — ABNORMAL LOW (ref 8.9–10.3)
Chloride: 101 mmol/L (ref 98–111)
Creatinine, Ser: 0.48 mg/dL — ABNORMAL LOW (ref 0.61–1.24)
GFR calc Af Amer: >60 mL/min (ref >60)
GFR calc non Af Amer: >60 mL/min (ref >60)
Glucose, Bld: 105 mg/dL — ABNORMAL HIGH (ref 70–99)
Potassium: 3.2 mmol/L — ABNORMAL LOW (ref 3.5–5.1)
Sodium: 139 mmol/L (ref 135–145)

## 2019-01-12 LAB — MAGNESIUM: Magnesium: 1.7 mg/dL (ref 1.7–2.4)

## 2019-01-12 LAB — GLUCOSE, CAPILLARY
Glucose-Capillary: 69 mg/dL — ABNORMAL LOW (ref 70–99)
Glucose-Capillary: 87 mg/dL (ref 70–99)

## 2019-01-12 MED ORDER — POTASSIUM CHLORIDE CRYS ER 20 MEQ PO TBCR
40.0000 meq | EXTENDED_RELEASE_TABLET | Freq: Once | ORAL | Status: AC
  Administered 2019-01-12: 16:00:00 40 meq via ORAL
  Filled 2019-01-12: qty 2

## 2019-01-12 MED ORDER — INSULIN DETEMIR 100 UNIT/ML ~~LOC~~ SOLN
5.0000 [IU] | Freq: Every day | SUBCUTANEOUS | Status: DC
  Administered 2019-01-13 – 2019-01-14 (×2): 5 [IU] via SUBCUTANEOUS
  Filled 2019-01-12 (×2): qty 0.05

## 2019-01-12 NOTE — Progress Notes (Signed)
PROGRESS NOTE  Willie [name]  DOB: 1976-03-28  PCP: Debbrah Alar, NP [id number]  DOA: 01/05/2019  LOS: 6 days   Brief narrative: Willie [name] is an 43 y.o. male with past medical history significant for IDDM, HTN, HLD, EtOH abuse, recurrent EtOH pancreatitis, anxiety, depression, GERD, and subdural hematoma; who presented with altered mental status on 9/8.  Patient had initially sustained a subdural hematoma on July 18 in [place],  for which he was admitted but left AMA.  Then admitted from July 28 through August 1 requiring bilateral craniotomies by Dr. Vertell Limber.  He was readmitted into the hospital from August 22-30 for confusion, found to [facility] recurrent left-sided subdural hematoma for which a subdural drain was placed and then removed on 8/25.  Initial imaging studies during this admission revealed fluid collection of the front temporal lobe brain requiring emergent drainage in the OR.  Intraoperative findings consistent with a subdural empyema.  Patient was in the ICU under the care of critical care, but was able to be extubated on 9/11 and transferred to the floor.  Speech therapy evaluate the patient and he was initially placed on a dysphasia diet but able to be transitioned to a regular diet with nectar thick liquids.  Patient has been treated with Maxipime, Flagyl, and vancomycin. Medical consultation was called on 9/14 for comanagement of medical issues.  Subjective: Patient was seen and examined this morning.  Young male.  Sleeping, opens eyes on verbal command.  Slow to respond but oriented to place and person, says it is March 2020.  Easily reoriented.  Able to follow commands.  Assessment/Plan: Subdural empyema:  Patient status post operative drainage of the frontal temporal lobe on 9/9 per neurosurgery.  Cultures show  no growth day 2. -Wound care per neurosurgery -Continue to follow-up culture -Continue antibiotics of Maxipime, Flagyl, and vancomycin day 5   -Continue PT/OT -Continue seizure prophylaxis of Keppra and changed from IV to p.o.  Dysphagia and expressive aphasia: Speech therapy  switched diet to regular with nectar thick liquids and medications crushed with pure. -Aspiration precaution   Diabetes mellitus type 2:  -Patient's last hemoglobin A1c noted to be 6.9 on 11/23/2018.   -On sliding scale insulin with Accu-Cheks.  Blood sugar trended up to 200s. Apparently Levemir 5 mg twice daily was started on 9/14.  Blood sugar fell down as low as 69 this morning. I will scale back Levemir to 5 mg daily.   -Carb modified diet -Hypoglycemic protocols   Normocytic anemia:  -Hemoglobin 10.3 last on 9/12. -Recheck CBC in a.m.  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 -Continue Seroquel, Lexapro, Klonopin, folic acid, and thiamine  Hypokalemia -Potassium low at 3.2 today.  Replace 40 mg oral potassium.  Repeat BMP tomorrow.  Body mass index is 22.38 kg/m. Mobility: PT eval DVT prophylaxis:  SCDs Code Status:   Code Status: Full Code  Family Communication:  Expected Discharge:  Defer to primary team  Antimicrobials: Anti-infectives (From admission, onward)   Start     Dose/Rate Route Frequency Ordered Stop   01/07/19 2200  ceFEPIme (MAXIPIME) 2 g in sodium chloride 0.9 % 100 mL IVPB  Status:  Discontinued     2 g 200 mL/hr over 30 Minutes Intravenous Every 8 hours 01/07/19 1618 01/12/19 1107   01/07/19 2100  metroNIDAZOLE (FLAGYL) IVPB 500 mg  Status:  Discontinued     500 mg 100 mL/hr over 60 Minutes Intravenous Every 8  hours 01/07/19 1618 01/12/19 1107   01/06/19 1600  vancomycin (VANCOCIN) 1,500 mg in sodium chloride 0.9 % 500 mL IVPB  Status:  Discontinued     1,500 mg 250 mL/hr over 120 Minutes Intravenous Every 12 hours 01/06/19 1355 01/12/19 1107   01/06/19 1215  piperacillin-tazobactam (ZOSYN) IVPB 3.375 g  Status:  Discontinued     3.375 g 12.5 mL/hr  over 240 Minutes Intravenous Every 8 hours 01/06/19 1207 01/06/19 1526   01/06/19 1200  clindamycin (CLEOCIN) IVPB 300 mg     300 mg 100 mL/hr over 30 Minutes Intravenous Every 6 hours 01/06/19 1155 01/07/19 0318   01/06/19 1200  cefTRIAXone (ROCEPHIN) 2 g in sodium chloride 0.9 % 100 mL IVPB  Status:  Discontinued     2 g 200 mL/hr over 30 Minutes Intravenous Every 12 hours 01/06/19 1155 01/07/19 1618   01/06/19 0745  piperacillin-tazobactam (ZOSYN) IVPB 3.375 g  Status:  Discontinued     3.375 g 100 mL/hr over 30 Minutes Intravenous Every 6 hours 01/06/19 0734 01/06/19 1206   01/06/19 0630  bacitracin 50,000 Units in sodium chloride 0.9 % 500 mL irrigation  Status:  Discontinued       As needed 01/06/19 0630 01/06/19 0741      Diet Order            Diet Carb Modified Fluid consistency: Nectar Thick; Room service appropriate? No  Diet effective now              Infusions:  . sodium chloride 100 mL/hr at 01/09/19 0335    Scheduled Meds: . sodium chloride   Intravenous Once  . sodium chloride   Intravenous Once  . amLODipine  10 mg Oral Daily  . chlorhexidine gluconate (MEDLINE KIT)  15 mL Mouth Rinse BID  . Chlorhexidine Gluconate Cloth  6 each Topical Daily  . clonazePAM  1 mg Oral BID  . escitalopram  20 mg Oral Daily  . feeding supplement (ENSURE ENLIVE)  237 mL Oral BID BM  . folic acid  1 mg Oral Daily  . insulin aspart  0-15 Units Subcutaneous TID WC  . insulin aspart  0-5 Units Subcutaneous QHS  . insulin detemir  5 Units Subcutaneous BID  . levETIRAcetam  500 mg Oral BID  . mouth rinse  15 mL Mouth Rinse BID  . multivitamin with minerals  1 tablet Oral Daily  . pantoprazole sodium  40 mg Oral QHS  . QUEtiapine  100 mg Oral BID  . QUEtiapine  300 mg Oral QHS  . sodium chloride flush  10-40 mL Intracatheter Q12H  . thiamine  100 mg Oral Daily    PRN meds: acetaminophen **OR** acetaminophen, HYDROmorphone (DILAUDID) injection, labetalol, LORazepam, LORazepam,  naLOXone (NARCAN)  injection, ondansetron **OR** ondansetron (ZOFRAN) IV, promethazine, Resource ThickenUp Clear, sodium chloride flush   Objective: Vitals:   01/12/19 1028 01/12/19 1235  BP: 91/66 (!) 144/100  Pulse:  88  Resp:  19  Temp:  98 F (36.7 C)  SpO2:  100%    Intake/Output Summary (Last 24 hours) at 01/12/2019 1357 Last data filed at 01/12/2019 0730 Gross per 24 hour  Intake 3034.02 ml  Output 1800 ml  Net 1234.02 ml   Filed Weights   01/06/19 0408 01/06/19 0425  Weight: 74.8 kg 74.8 kg   Weight change:  Body mass index is 22.38 kg/m.   Physical Exam: General exam: Appears calm and comfortable.  Lying down in bed.  Opens eyes on verbal  command Skin: No rashes, lesions or ulcers. HEENT: Surgical scar seen in the scalp on left temporal region  Lungs: Clear to auscultation bilaterally CVS: Regular rate and rhythm, no murmur GI/Abd soft, nontender, nondistended, bowel sound present CNS: Alert, awake, oriented to place and person, states it is March 2020, easily reoriented Psychiatry: Mood appropriate at this time.  Not restless or violent Extremities: No pedal edema, no calf tenderness  Data Review: I have personally reviewed the laboratory data and studies available.  Recent Labs  Lab 01/06/19 0002 01/06/19 1220 01/08/19 0400 01/09/19 0437 01/12/19 0332  WBC 9.5 9.4 10.1 8.2 7.5  NEUTROABS 7.2  --  7.4 6.5 5.0  HGB 11.3* 10.5* 9.9* 10.3* 9.8*  HCT 36.2* 30.7* 31.0* 31.6* 28.9*  MCV 91.0 83.0 87.8 86.3 85.3  PLT 1,312* 1,053* 750* 566* 599*   Recent Labs  Lab 01/06/19 1220 01/07/19 0009 01/07/19 0442 01/08/19 0400 01/09/19 0437 01/12/19 0332  NA 139  --  136 139 137 139  K 2.8*  --  4.0 3.9 3.6 3.2*  CL 104  --  107 111 106 101  CO2 23  --  21* 21* 22 28  GLUCOSE 73  --  159* 151* 108* 105*  BUN <5*  --  <5* 5* <5* <5*  CREATININE 0.54*  --  0.56* 0.53* 0.57* 0.48*  CALCIUM 8.1*  --  7.8* 8.0* 8.1* 8.5*  MG  --  2.0 1.9 1.8 1.5* 1.7  PHOS   --  3.7 4.2 5.2* 4.3  --     Terrilee Croak, MD  [facility] 01/12/2019

## 2019-01-12 NOTE — Progress Notes (Addendum)
Physical Therapy Treatment Patient Details Name: Willie [name] MRN: [id number] DOB: August 01, 1975 Today's Date: 01/12/2019    History of Present Illness This 43 y.o. male admitted with worsening confusion and agitation.  CT of brain showed Large Lt frontal SDH which appears to be subacute in nature with marked mass effect with significant Lt to Rt shift.  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  PMH includes:  ETOH abuse, alcoholic pancreatitis, depression, DM, HTN, Sleep apnea, h/o back pain     PT Comments    Patient seen for mobility progression. Pt requires min guard/min A for gait training and with posterior LOB X 1. Pt with DGI score of 17/24 indicating risk for falls. Pt will need 24 hour supervision/assistance upon d/c given pt's cognitive deficits and impaired balance. Recommend outpatient PT for further skilled PT services to maximize independence and safety with mobility.   Follow Up Recommendations  Outpatient PT;Supervision/Assistance - 24 hour     Equipment Recommendations  None recommended by PT    Recommendations for Other Services       Precautions / Restrictions Precautions Precautions: Fall Restrictions Weight Bearing Restrictions: No    Mobility  Bed Mobility               General bed mobility comments: pt up in bathroom with RN present  Transfers Overall transfer level: Needs assistance Equipment used: None Transfers: Sit to/from Stand Sit to Stand: Min guard         General transfer comment: min guard for safety  Ambulation/Gait Ambulation/Gait assistance: Min guard;Min assist Gait Distance (Feet): 250 Feet Assistive device: None Gait Pattern/deviations: Step-through pattern;Decreased stride length;Drifts right/left Gait velocity: decreased   General Gait Details: pt with LOB X 1 posteriorly with directional change; grossly min guard for  safety   Stairs             Wheelchair Mobility    Modified Rankin (Stroke Patients Only) Modified Rankin (Stroke Patients Only) Pre-Morbid Rankin Score: No symptoms Modified Rankin: Moderately severe disability     Balance Overall balance assessment: Needs assistance                               Standardized Balance Assessment Standardized Balance Assessment : Dynamic Gait Index   Dynamic Gait Index Level Surface: Mild Impairment Change in Gait Speed: Mild Impairment Gait with Horizontal Head Turns: Mild Impairment Gait with Vertical Head Turns: Mild Impairment Gait and Pivot Turn: Mild Impairment Step Over Obstacle: Normal Step Around Obstacles: Mild Impairment Steps: Mild Impairment Total Score: 17      Cognition Arousal/Alertness: Awake/alert Behavior During Therapy: WFL for tasks assessed/performed;Impulsive Overall Cognitive Status: No family/caregiver present to determine baseline cognitive functioning Area of Impairment: Safety/judgement;Awareness;Problem solving                       Following Commands: Follows one step commands with increased time;Follows multi-step commands inconsistently Safety/Judgement: Decreased awareness of deficits;Decreased awareness of safety   Problem Solving: Requires verbal cues;Difficulty sequencing        Exercises      General Comments        Pertinent Vitals/Pain Pain Assessment: No/denies pain    Home Living  Prior Function            PT Goals (current goals can now be found in the care plan section) Acute Rehab PT Goals Patient Stated Goal: to go home Progress towards PT goals: Progressing toward goals    Frequency    Min 3X/week      PT Plan Current plan remains appropriate    Co-evaluation              AM-PAC PT "6 Clicks" Mobility   Outcome Measure  Help needed turning from your back to your side while in a flat bed without  using bedrails?: None Help needed moving from lying on your back to sitting on the side of a flat bed without using bedrails?: None Help needed moving to and from a bed to a chair (including a wheelchair)?: A Little Help needed standing up from a chair using your arms (e.g., wheelchair or bedside chair)?: A Little Help needed to walk in hospital room?: A Little Help needed climbing 3-5 steps with a railing? : A Little 6 Click Score: 20    End of Session Equipment Utilized During Treatment: Gait belt Activity Tolerance: Patient tolerated treatment well Patient left: in chair;with chair alarm set;with call bell/phone within reach Nurse Communication: Mobility status PT Visit Diagnosis: Unsteadiness on feet (R26.81);Pain;Other abnormalities of gait and mobility (R26.89);Muscle weakness (generalized) (M62.81)     Time: [id number] PT Time Calculation (min) (ACUTE ONLY): 18 min  Charges:  $Gait Training: 8-22 mins                     Willie [name], PTA Acute Rehabilitation Services Pager: [phone] Office: [phone]     Darliss Cheney 01/12/2019, 4:57 PM

## 2019-01-12 NOTE — Progress Notes (Addendum)
Subjective: Patient reports no problems this morning, smiling  Objective: Vital signs in last 24 hours: Temp:  [98.2 F (36.8 C)-99.9 F (37.7 C)] 98.2 F (36.8 C) (09/15 0400) Pulse Rate:  [85-106] 88 (09/15 0600) Resp:  [16-21] 21 (09/15 0600) BP: (117-146)/(80-100) 137/92 (09/15 0600) SpO2:  [96 %-99 %] 97 % (09/15 0600)  Intake/Output from previous day: 09/14 0701 - 09/15 0700 In: 2924 [P.O.:240; I.V.:1784; IV Piggyback:900] Out: 1800 [Urine:1800] Intake/Output this shift: No intake/output data recorded.  Awake, cooperative. Denies pain or discomfort. Nursing reports two episodes of pain overnight treated successfully with Dilaudid. MAEW. PEARL. No drift. Incision healing nicely with staples present.   Lab Results: Recent Labs    01/12/19 0332  WBC 7.5  HGB 9.8*  HCT 28.9*  PLT 599*   BMET Recent Labs    01/12/19 0332  NA 139  K 3.2*  CL 101  CO2 28  GLUCOSE 105*  BUN <5*  CREATININE 0.48*  CALCIUM 8.5*    Studies/Results: No results found.  Assessment/Plan: improving  LOS: 6 days  Supportive care continues. Mobilize with therapies. Appreciate Medicine Service assistance.   Verdis Prime 01/12/2019, 7:26 AM   Will need disposition planning.

## 2019-01-12 NOTE — Progress Notes (Signed)
PT Cancellation Note  Patient Details Name: Willie [name] MRN: [id number] DOB: 1975/10/13   Cancelled Treatment:    Reason Eval/Treat Not Completed: Patient at procedure or test/unavailable Pt leaving unit for swallow study. PT will continue to follow acutely.    Earney Navy, PTA Acute Rehabilitation Services Pager: [phone] Office: [phone]   01/12/2019, 10:59 AM

## 2019-01-12 NOTE — Progress Notes (Signed)
Modified Barium Swallow Progress Note  Patient Details  Name: Willie [name] MRN: [id number] Date of Birth: Feb 26, 1976  Today's Date: 01/12/2019  Modified Barium Swallow completed.  Full report located under Chart Review in the Imaging Section.  Brief recommendations include the following:  Clinical Impression     Pt has a mild oropharyngeal dysphagia with timing and sensory components. He has mildly reduced A/P transit orally, leaving mild oral residuals that he clears spontaneously with a second swallow. He has good airway protection regardless of consistency when he takes single boluses - even large ones. However, when he is allowed to self-regulate and begins drinking more continuously, he has silent aspiration of thin liquids during the swallow. A cued cough does not eject aspirates from the airway. Given the impulsivity that has been observed at bedside, would continue with nectar thick liquids for now, as it increases his independence with self-feeding. SLP will f/u for trials of thin liquids with good potential to advance if he can self-regulate pace.    Swallow Evaluation Recommendations       SLP Diet Recommendations: Regular solids;Nectar thick liquid   Liquid Administration via: Cup;Straw   Medication Administration: Whole meds with puree   Supervision: Patient able to self feed;Intermittent supervision to cue for compensatory strategies   Compensations: Small sips/bites;Slow rate   Postural Changes: Seated upright at 90 degrees   Oral Care Recommendations: Oral care BID        Venita Sheffield Suly Vukelich 01/12/2019,12:49 PM   Pollyann Glen, M.A. [place] [facility] Environmental education officer [phone] Office [phone]

## 2019-01-12 NOTE — Plan of Care (Signed)
  Problem: Activity: Goal: Risk for activity intolerance will decrease Outcome: Progressing   

## 2019-01-12 NOTE — Progress Notes (Signed)
Inpatient Diabetes Program Recommendations  AACE/ADA: New Consensus Statement on Inpatient Glycemic Control (2015)  Target Ranges:  Prepandial:   less than 140 mg/dL      Peak postprandial:   less than 180 mg/dL (1-2 hours)      Critically ill patients:  140 - 180 mg/dL   Lab Results  Component Value Date   GLUCAP 87 01/12/2019   HGBA1C 6.9 (H) 11/23/2018    Review of Glycemic Control Results for Willie [name], Willie [name] (MRN [id number]) as of 01/12/2019 09:01  Ref. Range 01/11/2019 08:35 01/11/2019 12:07 01/11/2019 17:09 01/11/2019 21:41 01/12/2019 06:26 01/12/2019 07:21  Glucose-Capillary Latest Ref Range: 70 - 99 mg/dL 144 (H) 281 (H) 312 (H) 256 (H) 69 (L) 87    Diabetes history: DM 2 Outpatient Diabetes medications: Lantus 25 units, Humalog 5-10 units tid Current orders for Inpatient glycemic control:  Levemir 5 units bid Novolog 0-15 units tid Novolog 0-5 units qhs  A1c 6.9% on 7/27  Inpatient Diabetes Program Recommendations:    Glucose trends increase after meals. Consider Novolog 4 units tid meal coverage. If patient consumes at least 50% of meals.   (note patient takes at least Humalog 5 units with meals at home)   Thanks,  Tama Headings RN, MSN, BC-ADM Inpatient Diabetes Coordinator Team Pager [phone] (8a-5p)

## 2019-01-12 NOTE — Progress Notes (Signed)
Pt has refused to follow any safety measures all day. PT refuses to stay in bed, pt takes off telemetry, pt gets naked . PT has been re-oriented several of times and still refuses to stay in bed. Mittens have been tried and he bites them off. Pt has been put in a low bed for safety , along with safety matts. Pt also  continues to disconnect his Midline IV. Will notify provider.

## 2019-01-13 LAB — GLUCOSE, CAPILLARY
Glucose-Capillary: 239 mg/dL — ABNORMAL HIGH (ref 70–99)
Glucose-Capillary: 246 mg/dL — ABNORMAL HIGH (ref 70–99)
Glucose-Capillary: 218 mg/dL — ABNORMAL HIGH (ref 70–99)
Glucose-Capillary: 106 mg/dL — ABNORMAL HIGH (ref 70–99)
Glucose-Capillary: 293 mg/dL — ABNORMAL HIGH (ref 70–99)
Glucose-Capillary: 261 mg/dL — ABNORMAL HIGH (ref 70–99)
Glucose-Capillary: 165 mg/dL — ABNORMAL HIGH (ref 70–99)

## 2019-01-13 LAB — BASIC METABOLIC PANEL
Anion gap: 7 (ref 5–15)
BUN: <5 mg/dL — ABNORMAL LOW (ref 6–20)
CO2: 26 mmol/L (ref 22–32)
Calcium: 8.7 mg/dL — ABNORMAL LOW (ref 8.9–10.3)
Chloride: 106 mmol/L (ref 98–111)
Creatinine, Ser: 0.47 mg/dL — ABNORMAL LOW (ref 0.61–1.24)
GFR calc Af Amer: >60 mL/min (ref >60)
GFR calc non Af Amer: >60 mL/min (ref >60)
Glucose, Bld: 108 mg/dL — ABNORMAL HIGH (ref 70–99)
Potassium: 3.6 mmol/L (ref 3.5–5.1)
Sodium: 139 mmol/L (ref 135–145)

## 2019-01-13 NOTE — Progress Notes (Signed)
Willie [name]  DOB: 09/12/1975  PCP: Debbrah Alar, NP [id number]  DOA: 01/05/2019  LOS: 7 days   Brief narrative: DREAM NODAL is an 43 y.o. male with past medical history significant for IDDM, HTN, HLD, EtOH abuse, recurrent EtOH pancreatitis, anxiety, depression, GERD, and subdural hematoma; who presented with altered mental status on 9/8.  Patient had initially sustained a subdural hematoma on July 18 in [place],  for which he was admitted but left AMA.  Then admitted from July 28 through August 1 requiring bilateral craniotomies by Dr. Vertell Limber.  He was readmitted into the hospital from August 22-30 for confusion, found to have recurrent left-sided subdural hematoma for which a subdural drain was placed and then removed on 8/25.  Initial imaging studies during this admission revealed fluid collection of the fronto-temporal lobe of the brain requiring emergent drainage in the OR. Intraoperative findings consistent with a subdural empyema.  Patient was in the ICU under the care of critical care, but was able to be extubated on 9/11 and transferred to the floor.  Speech therapy evaluate the patient and he was initially placed on a dysphasia diet but able to be transitioned to a regular diet with nectar thick liquids.  Medical consultation was called on 9/14 for comanagement of medical issues.  Subjective: Patient was seen and examined at the bedside.  Denies any complaints.  Denies any headache nausea vomiting.  Blood sugars better now.  He says he lives with his fiance and has no problem going home. Has some trouble sleeping at night otherwise no new complaints.  Assessment/Plan: Subdural empyema:  Patient status post operative drainage of the frontal temporal lobe on 9/9 per neurosurgery.   -Negative cultures -Clinically improved. -Finish 7 days of antibiotic therapy. -Continue PT/OT -Continue seizure prophylaxis of Keppra  Dysphagia and expressive  aphasia: Speech therapy  switched diet to regular with nectar thick liquids and medications crushed with pure. -Aspiration precautions.   Diabetes mellitus type 2:  -Patient's last hemoglobin A1c noted to be 6.9 on 11/23/2018.   -On sliding scale insulin with Accu-Cheks.  Blood sugars slightly unpredictable.  -We will keep on lower doses of insulin until has good appetite.   Normocytic anemia:  -Hemoglobin is stable.  Essential hypertension -Continue amlodipine.  Is stable  Combative behavior with history of alcohol abuse:  Stabilizing now. -Continue Seroquel, Lexapro, Klonopin, folic acid, and thiamine  Hypokalemia -Replaced and improved.  Body mass index is 22.38 kg/m. Mobility: PT eval DVT prophylaxis:  SCDs Code Status:   Code Status: Full Code  Family Communication:  None. Expected Discharge:  Defer to primary team.  Probably ready to be discharged.  Antimicrobials: Anti-infectives (From admission, onward)   Start     Dose/Rate Route Frequency Ordered Stop   01/07/19 2200  ceFEPIme (MAXIPIME) 2 g in sodium chloride 0.9 % 100 mL IVPB  Status:  Discontinued     2 g 200 mL/hr over 30 Minutes Intravenous Every 8 hours 01/07/19 1618 01/12/19 1107   01/07/19 2100  metroNIDAZOLE (FLAGYL) IVPB 500 mg  Status:  Discontinued     500 mg 100 mL/hr over 60 Minutes Intravenous Every 8 hours 01/07/19 1618 01/12/19 1107   01/06/19 1600  vancomycin (VANCOCIN) 1,500 mg in sodium chloride 0.9 % 500 mL IVPB  Status:  Discontinued     1,500 mg 250 mL/hr over 120 Minutes Intravenous Every 12 hours 01/06/19 1355 01/12/19 1107   01/06/19 1215  piperacillin-tazobactam (ZOSYN) IVPB 3.375  g  Status:  Discontinued     3.375 g 12.5 mL/hr over 240 Minutes Intravenous Every 8 hours 01/06/19 1207 01/06/19 1526   01/06/19 1200  clindamycin (CLEOCIN) IVPB 300 mg     300 mg 100 mL/hr over 30 Minutes Intravenous Every 6 hours 01/06/19 1155 01/07/19 0318   01/06/19 1200  cefTRIAXone (ROCEPHIN) 2 g  in sodium chloride 0.9 % 100 mL IVPB  Status:  Discontinued     2 g 200 mL/hr over 30 Minutes Intravenous Every 12 hours 01/06/19 1155 01/07/19 1618   01/06/19 0745  piperacillin-tazobactam (ZOSYN) IVPB 3.375 g  Status:  Discontinued     3.375 g 100 mL/hr over 30 Minutes Intravenous Every 6 hours 01/06/19 0734 01/06/19 1206   01/06/19 0630  bacitracin 50,000 Units in sodium chloride 0.9 % 500 mL irrigation  Status:  Discontinued       As needed 01/06/19 0630 01/06/19 0741      Diet Order            Diet Carb Modified Fluid consistency: Nectar Thick; Room service appropriate? No  Diet effective now              Infusions:    Scheduled Meds: . sodium chloride   Intravenous Once  . sodium chloride   Intravenous Once  . amLODipine  10 mg Oral Daily  . chlorhexidine gluconate (MEDLINE KIT)  15 mL Mouth Rinse BID  . Chlorhexidine Gluconate Cloth  6 each Topical Daily  . clonazePAM  1 mg Oral BID  . escitalopram  20 mg Oral Daily  . feeding supplement (ENSURE ENLIVE)  237 mL Oral BID BM  . folic acid  1 mg Oral Daily  . insulin aspart  0-15 Units Subcutaneous TID WC  . insulin aspart  0-5 Units Subcutaneous QHS  . insulin detemir  5 Units Subcutaneous Daily  . levETIRAcetam  500 mg Oral BID  . mouth rinse  15 mL Mouth Rinse BID  . multivitamin with minerals  1 tablet Oral Daily  . pantoprazole sodium  40 mg Oral QHS  . QUEtiapine  100 mg Oral BID  . QUEtiapine  300 mg Oral QHS  . sodium chloride flush  10-40 mL Intracatheter Q12H  . thiamine  100 mg Oral Daily    PRN meds: acetaminophen **OR** acetaminophen, HYDROmorphone (DILAUDID) injection, labetalol, LORazepam, LORazepam, naLOXone (NARCAN)  injection, ondansetron **OR** ondansetron (ZOFRAN) IV, promethazine, Resource ThickenUp Clear, sodium chloride flush   Objective: Vitals:   01/13/19 0329 01/13/19 0836  BP: (!) 126/97 121/84  Pulse: 83 (!) 106  Resp: 15 16  Temp: 97.7 F (36.5 C) (!) 97.5 F (36.4 C)  SpO2:  99% 99%    Intake/Output Summary (Last 24 hours) at 01/13/2019 1127 Last data filed at 01/13/2019 0900 Gross per 24 hour  Intake 1576.7 ml  Output 800 ml  Net 776.7 ml   Filed Weights   01/06/19 0408 01/06/19 0425  Weight: 74.8 kg 74.8 kg   Weight change:  Body mass index is 22.38 kg/m.   Physical Exam: General exam: Appears calm and comfortable.  Walking to bathroom with supervision.  Skin: No rashes, lesions or ulcers. HEENT: Surgical scar seen in the scalp on left temporal region , stable and nontender. Lungs: Clear to auscultation bilaterally CVS: Regular rate and rhythm, no murmur GI/Abd soft, nontender, nondistended, bowel sound present CNS: Alert, awake, oriented to place and person, he has some cognitive delay. Psychiatry: Mood appropriate at this time.  Not  restless or violent Extremities: No pedal edema, no calf tenderness  Data Review: I have personally reviewed the laboratory data and studies available.  Recent Labs  Lab 01/06/19 1220 01/08/19 0400 01/09/19 0437 01/12/19 0332  WBC 9.4 10.1 8.2 7.5  NEUTROABS  --  7.4 6.5 5.0  HGB 10.5* 9.9* 10.3* 9.8*  HCT 30.7* 31.0* 31.6* 28.9*  MCV 83.0 87.8 86.3 85.3  PLT 1,053* 750* 566* 599*   Recent Labs  Lab 01/07/19 0009 01/07/19 0442 01/08/19 0400 01/09/19 0437 01/12/19 0332 01/13/19 0632  NA  --  136 139 137 139 139  K  --  4.0 3.9 3.6 3.2* 3.6  CL  --  107 111 106 101 106  CO2  --  21* 21* '22 28 26  ' GLUCOSE  --  159* 151* 108* 105* 108*  BUN  --  <5* 5* <5* <5* <5*  CREATININE  --  0.56* 0.53* 0.57* 0.48* 0.47*  CALCIUM  --  7.8* 8.0* 8.1* 8.5* 8.7*  MG 2.0 1.9 1.8 1.5* 1.7  --   PHOS 3.7 4.2 5.2* 4.3  --   --    Total time spent: 28 minutes.   Barb Merino, MD  [facility] 01/13/2019

## 2019-01-13 NOTE — Progress Notes (Signed)
Pt refused labdraw

## 2019-01-13 NOTE — Evaluation (Addendum)
Speech Language Pathology Evaluation Patient Details Name: Willie [name] MRN: [id number] DOB: 08/28/1975 Today's Date: 01/13/2019 Time: 1420-1450 SLP Time Calculation (min) (ACUTE ONLY): 30 min  Problem List:  Patient Active Problem List   Diagnosis Date Noted  . Malnutrition of moderate degree 01/09/2019  . 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 Subdural bleeding ([place]) 11/24/2018  . Abdominal pain 11/22/2018  . SDH (subdural hematoma) ([place]) 11/22/2018  . Abnormal LFTs 11/22/2018  . IDDM (insulin dependent diabetes mellitus) ([place]) 09/12/2018  . Acute hepatitis 09/11/2018  . Alcohol-induced mood disorder ([place])   . DKA (diabetic ketoacidoses) ([place]) 07/28/2017  . Alcohol withdrawal ([place]) 07/28/2017  . Abnormal CT scan, colon   . Benign neoplasm of descending colon   . Lower GI bleed 07/04/2017  . Transaminitis 07/04/2017  . Pain   . Bleeding internal hemorrhoids   . Anemia of chronic disease   . Left arm weakness   . Anemia, blood loss   . Vomiting alone 03/27/2017  . Chest pain 03/27/2017  . Nausea and vomiting   . Lactic acidosis 03/26/2017  . History of alcohol abuse 06/25/2016  . Tobacco abuse 06/25/2016  . Anxiety state 11/20/2015  . Dental infection 07/10/2015  . Mood disorder ([place]) 04/18/2015  . Eczema 06/27/2014  . GERD (gastroesophageal reflux disease) 05/24/2014  . Snoring 04/07/2013  . Suicide attempt by substance overdose ([place]) 02/10/2013  . Alcohol abuse 12/31/2012  . Erectile dysfunction 12/09/2012  . Hematuria 06/11/2012  . Vision problem 02/02/2012  . Insomnia 12/20/2011  . Chronic pancreatitis ([place]) 02/05/2011  . Depression with anxiety 11/13/2010  . Hyperlipemia 06/27/2010  . Diabetes type 2, uncontrolled ([place]) 05/22/2010  . Essential hypertension 05/22/2010   Past Medical History:  Past Medical History:  Diagnosis Date  . Alcohol abuse   . Alcoholic pancreatitis    recurrent  . Depression   . Diabetes mellitus, type II ([place])    [place]  03/2010  . GERD (gastroesophageal reflux disease)   . High cholesterol   . 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 Hypertension   . Sleep apnea    does not wear a CPAP   Past Surgical History:  Past Surgical History:  Procedure Laterality Date  . COLONOSCOPY W/ BIOPSIES  09/2010   Dr. Ardis Hughs.  for intermittent rectal bleeding.  Mild sigmoid to descending diverticulosis.  Mild left colon erythema, benign biopsy, probably "prep effect"  . COLONOSCOPY WITH PROPOFOL N/A 07/06/2017   Procedure: COLONOSCOPY WITH PROPOFOL;  Surgeon: Jerene Bears, MD;  Location: [facility];  Service: Gastroenterology;  Laterality: N/A;  . CRANIOTOMY Bilateral 11/24/2018   Procedure: Bilateral craniotomy for evacuation of subdural hematoma;  Surgeon: Erline Levine, MD;  Location: [facility];  Service: Neurosurgery;  Laterality: Bilateral;  Bilateral craniotomy for evacuation of subdural hematoma  . CRANIOTOMY Left 01/06/2019   Procedure: CRANIECTOMY;  Surgeon: Earnie Larsson, MD;  Location: [facility];  Service: Neurosurgery;  Laterality: Left;  . LUMBAR MICRODISCECTOMY  ~ 2004   Dr Ellene Route   HPI:  43 year old male status post prior bilateral craniotomies for subdural by Dr. Vertell Limber.  Patient with recurrent left-sided subdural hematoma status post drain placement and secondary evacuation by Dr. Saintclair Halsted little over 10 days ago.  The patient now presents with worsening confusion and agitation.  CT scan done at [facility] long emergency room demonstrates a large left frontal subdural hematoma which appears to be subacute in nature.  There is marked  mass-effect with significant left to right shift.  There is no evidence of obvious acute hemorrhage.  The patient has no history of fever or other signs of infection.  No documented seizure activity   Assessment / Plan / Recommendation Clinical Impression  Portions of the Cognistat and informal assessment were completed to assess pt speech,  language, and cognitive status. Cognition was dificult to fully assess, due to verbal apraxia and expressive language errors, however, clock drawing task indicates difficulty with planning, organization, attention, and executive functions. Receptively, Pt was able to follow one step verbal commands, and answer yes/no questions correctly. Expressively, pt exhibited errors during production of automatic sequences (changing order of days and months, difficulty saying the alphabet). He was able to repeat multisyllabic words but had difficulty with phrase length material. Sentence completion and responsive naming were accurate, however, pt had difficulty with confrontational naming, switching or altering letters in words ("mike" for bike, p-mim for PM). Pt presents with verbal apraxia, characterized by deterioration with increasing word length and repetition of the same word multiple times. Initial mispronunciation of words was largely corrected upon request for repetition during the eval. Frequent errors were noted during a short phone conversation.   Pt would benefit from continued ST intervention to maximize effective communication. 24 hour supervision is highly encouraged given the extent of deficits. Pt is hopeful to be discharged soon. Recommend continued skilled ST intervention at next level of care.    SLP Assessment  SLP Recommendation/Assessment: Patient needs continued Speech Language Pathology Services SLP Visit Diagnosis: Cognitive communication deficit (R41.841)    Follow Up Recommendations  Outpatient SLP;Home health SLP;24 hour supervision/assistance    Frequency and Duration min 2x/week  2 weeks      SLP Evaluation Cognition  Overall Cognitive Status: No family/caregiver present to determine baseline cognitive functioning Arousal/Alertness: Awake/alert Orientation Level: Oriented to person;Oriented to time       Comprehension  Auditory Comprehension Overall Auditory Comprehension:  Appears within functional limits for tasks assessed    Expression Expression Primary Mode of Expression: Verbal Verbal Expression Overall Verbal Expression: Impaired Initiation: No impairment Automatic Speech: Name;Social Response;Counting;Day of week;Month of year(errors noted with production of automatic sequences.) Level of Generative/Spontaneous Verbalization: Conversation;Sentence Repetition: Impaired Level of Impairment: Sentence level;Phrase level Naming: Impairment Responsive: 76-100% accurate Confrontation: Impaired Convergent: 50-74% accurate Divergent: 50-74% accurate Verbal Errors: Neologisms;Aware of errors;Phonemic paraphasias Pragmatics: No impairment Non-Verbal Means of Communication: Not applicable Written Expression Dominant Hand: Right Written Expression: Not tested   Oral / Motor  Oral Motor/Sensory Function Overall Oral Motor/Sensory Function: Within functional limits Motor Speech Overall Motor Speech: Impaired Respiration: Within functional limits Phonation: Normal Resonance: Within functional limits Articulation: Impaired Level of Impairment: Sentence Intelligibility: Intelligible Motor Planning: Impaired Level of Impairment: Word Motor Speech Errors: Aware   GO                   Celia B. Quentin [facility]re [facility], CCC-SLP Speech Language Pathologist [phone]  Shonna Chock 01/13/2019, 3:15 PM

## 2019-01-13 NOTE — Progress Notes (Signed)
Subjective: Patient reports doing well.  Wants to go home.  Objective: Vital signs in last 24 hours: Temp:  [97.5 F (36.4 C)-98.3 F (36.8 C)] 97.5 F (36.4 C) (09/16 0836) Pulse Rate:  [83-106] 106 (09/16 0836) Resp:  [15-19] 16 (09/16 0836) BP: (91-144)/(66-100) 121/84 (09/16 0836) SpO2:  [97 %-100 %] 99 % (09/16 0836)  Intake/Output from previous day: 09/15 0701 - 09/16 0700 In: 1696.7 [P.O.:840; I.V.:856.7] Out: -  Intake/Output this shift: No intake/output data recorded.  Physical Exam: Impulsive, ambulating, not following instructions.  MAEW.  Alert, conversant.  Lab Results: Recent Labs    01/12/19 0332  WBC 7.5  HGB 9.8*  HCT 28.9*  PLT 599*   BMET Recent Labs    01/12/19 0332 01/13/19 0632  NA 139 139  K 3.2* 3.6  CL 101 106  CO2 28 26  GLUCOSE 105* 108*  BUN <5* <5*  CREATININE 0.48* 0.47*  CALCIUM 8.5* 8.7*    Studies/Results: Dg Swallowing Func-speech Pathology  Result Date: 01/12/2019 Objective Swallowing Evaluation: Type of Study: MBS-Modified Barium Swallow Study  Patient Details Name: Willie [name] MRN: [id number] Date of Birth: 24-May-1975 Today's Date: 01/12/2019 Time: SLP Start Time (ACUTE ONLY): 1130 -SLP Stop Time (ACUTE ONLY): 1142 SLP Time Calculation (min) (ACUTE ONLY): 12 min Past Medical History: Past Medical History: Diagnosis Date . Alcohol abuse  . Alcoholic pancreatitis   recurrent . Depression  . Diabetes mellitus, type II ([place])   New Onset 03/2010 . GERD (gastroesophageal reflux disease)  . High cholesterol  . 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 Hypertension  . Sleep apnea   does not wear a CPAP Past Surgical History: Past Surgical History: Procedure Laterality Date . COLONOSCOPY W/ BIOPSIES  09/2010  Dr. Ardis Hughs.  for intermittent rectal bleeding.  Mild sigmoid to descending diverticulosis.  Mild left colon erythema, benign biopsy, probably "prep effect" . COLONOSCOPY WITH PROPOFOL  N/A 07/06/2017  Procedure: COLONOSCOPY WITH PROPOFOL;  Surgeon: Jerene Bears, MD;  Location: [facility];  Service: Gastroenterology;  Laterality: N/A; . CRANIOTOMY Bilateral 11/24/2018  Procedure: Bilateral craniotomy for evacuation of subdural hematoma;  Surgeon: Erline Levine, MD;  Location: [place];  Service: Neurosurgery;  Laterality: Bilateral;  Bilateral craniotomy for evacuation of subdural hematoma . CRANIOTOMY Left 01/06/2019  Procedure: CRANIECTOMY;  Surgeon: Earnie Larsson, MD;  Location: [place];  Service: Neurosurgery;  Laterality: Left; . LUMBAR MICRODISCECTOMY  ~ 2004  Dr Ellene Route HPI: 43 year old male status post prior bilateral craniotomies for subdural by Dr. Vertell Limber.  Patient with recurrent left-sided subdural hematoma status post drain placement and secondary evacuation by Dr. Saintclair Halsted little over 10 days ago.  The patient now presents with worsening confusion and agitation.  CT scan done at [facility] long emergency room demonstrates a large left frontal subdural hematoma which appears to be subacute in nature.  There is marked mass-effect with significant left to right shift.  There is no evidence of obvious acute hemorrhage.  The patient has no history of fever or other signs of infection.  No documented seizure activity  Subjective: alert, cooperative Assessment / Plan / Recommendation CHL IP CLINICAL IMPRESSIONS 01/12/2019 Clinical Impression Pt has a mild oropharyngeal dysphagia with timing and sensory components. He has mildly reduced A/P transit orally, leaving mild oral residuals that he clears spontaneously with a second swallow. He has good airway protection regardless of consistency when he takes single boluses - even large ones. However, when he  is allowed to self-regulate and begins drinking more continuously, he has silent aspiration of thin liquids during the swallow. A cued cough does not eject aspirates from the airway. Given the impulsivity that has been observed at bedside, would continue with  nectar thick liquids for now, as it increases his independence with self-feeding. SLP will f/u for trials of thin liquids with good potential to advance if he can self-regulate pace.  SLP Visit Diagnosis Dysphagia, oropharyngeal phase (R13.12) Attention and concentration deficit following -- Frontal lobe and executive function deficit following -- Impact on safety and function Mild aspiration risk   CHL IP TREATMENT RECOMMENDATION 01/12/2019 Treatment Recommendations Therapy as outlined in treatment plan below   Prognosis 01/12/2019 Prognosis for Safe Diet Advancement Good Barriers to Reach Goals -- Barriers/Prognosis Comment -- CHL IP DIET RECOMMENDATION 01/12/2019 SLP Diet Recommendations Regular solids;Nectar thick liquid Liquid Administration via Cup;Straw Medication Administration Whole meds with puree Compensations Small sips/bites;Slow rate Postural Changes Seated upright at 90 degrees   CHL IP OTHER RECOMMENDATIONS 01/12/2019 Recommended Consults -- Oral Care Recommendations Oral care BID Other Recommendations --   CHL IP FOLLOW UP RECOMMENDATIONS 01/12/2019 Follow up Recommendations Outpatient SLP;Home health SLP;24 hour supervision/assistance   CHL IP FREQUENCY AND DURATION 01/12/2019 Speech Therapy Frequency (ACUTE ONLY) min 2x/week Treatment Duration 2 weeks      CHL IP ORAL PHASE 01/12/2019 Oral Phase Impaired Oral - Pudding Teaspoon -- Oral - Pudding Cup -- Oral - Honey Teaspoon -- Oral - Honey Cup -- Oral - Nectar Teaspoon -- Oral - Nectar Cup -- Oral - Nectar Straw Reduced posterior propulsion;Lingual/palatal residue Oral - Thin Teaspoon -- Oral - Thin Cup Reduced posterior propulsion;Lingual/palatal residue Oral - Thin Straw Reduced posterior propulsion;Lingual/palatal residue Oral - Puree Reduced posterior propulsion;Lingual/palatal residue Oral - Mech Soft -- Oral - Regular -- Oral - Multi-Consistency -- Oral - Pill Reduced posterior propulsion Oral Phase - Comment --  CHL IP PHARYNGEAL PHASE 01/12/2019  Pharyngeal Phase Impaired Pharyngeal- Pudding Teaspoon -- Pharyngeal -- Pharyngeal- Pudding Cup -- Pharyngeal -- Pharyngeal- Honey Teaspoon -- Pharyngeal -- Pharyngeal- Honey Cup -- Pharyngeal -- Pharyngeal- Nectar Teaspoon -- Pharyngeal -- Pharyngeal- Nectar Cup -- Pharyngeal -- Pharyngeal- Nectar Straw WFL Pharyngeal -- Pharyngeal- Thin Teaspoon -- Pharyngeal -- Pharyngeal- Thin Cup Penetration/Aspiration during swallow Pharyngeal Material enters airway, passes BELOW cords without attempt by patient to eject out (silent aspiration) Pharyngeal- Thin Straw Penetration/Aspiration during swallow Pharyngeal Material enters airway, passes BELOW cords without attempt by patient to eject out (silent aspiration) Pharyngeal- Puree WFL Pharyngeal -- Pharyngeal- Mechanical Soft WFL Pharyngeal -- Pharyngeal- Regular -- Pharyngeal -- Pharyngeal- Multi-consistency -- Pharyngeal -- Pharyngeal- Pill WFL Pharyngeal -- Pharyngeal Comment --  CHL IP CERVICAL ESOPHAGEAL PHASE 01/12/2019 Cervical Esophageal Phase WFL Pudding Teaspoon -- Pudding Cup -- Honey Teaspoon -- Honey Cup -- Nectar Teaspoon -- Nectar Cup -- Nectar Straw -- Thin Teaspoon -- Thin Cup -- Thin Straw -- Puree -- Mechanical Soft -- Regular -- Multi-consistency -- Pill -- Cervical Esophageal Comment -- Venita Sheffield Nix 01/12/2019, 12:50 PM  Pollyann Glen, M.A. [place] Acute Rehabilitation Services Pager [phone] Office [phone]              Assessment/Plan: Neurosurgical issues resolved.  Now concern is disposition, diabetes management,  and safety of patient.  Need to involve case manager, hospitalist.  OK to transfer from [place] level to Med Surg status.    LOS: 7 days    Peggyann Shoals, MD 01/13/2019, 8:39 AM

## 2019-01-13 NOTE — Progress Notes (Signed)
  Speech Language Pathology Treatment: Dysphagia  Patient Details Name: Willie [name] MRN: [id number] DOB: 11-21-1975 Today's Date: 01/13/2019 Time: 1400-1420 SLP Time Calculation (min) (ACUTE ONLY): 20 min  Assessment / Plan / Recommendation Clinical Impression  Pt was seen at bedside for follow up after MBS completed 01/12/2019. Results and recommendations reviewed with pt. Trials of thin liquid given via cup sip with encouragement to take one small sip at a time. Consecutive boluses of thin liquid taken via straw. Straw was removed and pt was encouraged to take small individual sips via cup only. Safe swallow precautions were updated and reviewed with pt; reposted at [facility]. SLP will continue to follow to provide education.    HPI HPI: 43 year old male status post prior bilateral craniotomies for subdural by Dr. Vertell Limber.  Patient with recurrent left-sided subdural hematoma status post drain placement and secondary evacuation by Dr. Saintclair Halsted little over 10 days ago.  The patient now presents with worsening confusion and agitation.  CT scan done at [facility] long emergency room demonstrates a large left frontal subdural hematoma which appears to be subacute in nature.  There is marked mass-effect with significant left to right shift.  There is no evidence of obvious acute hemorrhage.  The patient has no history of fever or other signs of infection.  No documented seizure activity      SLP Plan  Continue with current plan of care       Recommendations  Diet recommendations: Nectar-thick liquid;Thin liquid;Regular(thin liquids via individual cup sip, only when supervised) Liquids provided via: Cup;No straw Medication Administration: Whole meds with puree Supervision: Patient able to self feed Compensations: Small sips/bites;Slow rate;Minimize environmental distractions Postural Changes and/or Swallow Maneuvers: Seated upright 90 degrees                Oral Care Recommendations: Oral care  BID Follow up Recommendations: Outpatient SLP;Home health SLP;24 hour supervision/assistance SLP Visit Diagnosis: Dysphagia, oropharyngeal phase (R13.12) Plan: Continue with current plan of care       GO               Anapaola Kinsel B. Quentin Ore [facility], CCC-SLP Speech Language Pathologist [phone]  Shonna Chock 01/13/2019, 2:54 PM

## 2019-01-13 NOTE — Progress Notes (Addendum)
Physical Therapy Treatment Patient Details Name: Willie [name] MRN: [id number] DOB: 09/16/1975 Today's Date: 01/13/2019    History of Present Illness This 43 y.o. male admitted with worsening confusion and agitation.  CT of brain showed Large Lt frontal SDH which appears to be subacute in nature with marked mass effect with significant Lt to Rt shift.  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  PMH includes:  ETOH abuse, alcoholic pancreatitis, depression, DM, HTN, Sleep apnea, h/o back pain     PT Comments    Patient seen for mobility progression. Current plan remains appropriate.    Follow Up Recommendations  Outpatient PT;Supervision/Assistance - 24 hour     Equipment Recommendations  None recommended by PT    Recommendations for Other Services       Precautions / Restrictions Precautions Precautions: Fall Restrictions Weight Bearing Restrictions: No    Mobility  Bed Mobility Overal bed mobility: Needs Assistance Bed Mobility: Supine to Sit;Sit to Supine   Sidelying to sit: Supervision Supine to sit: Supervision     General bed mobility comments: supervision for safety  Transfers Overall transfer level: Needs assistance Equipment used: None Transfers: Sit to/from Stand Sit to Stand: Supervision            Ambulation/Gait Ambulation/Gait assistance: Min guard   Assistive device: None Gait Pattern/deviations: Step-through pattern;Decreased stride length;Drifts right/left Gait velocity: decreased   General Gait Details: min guard for safety given instability;no LOB this session   Stairs             Wheelchair Mobility    Modified Rankin (Stroke Patients Only) Modified Rankin (Stroke Patients Only) Pre-Morbid Rankin Score: No symptoms Modified Rankin: Moderately severe disability     Balance Overall balance assessment: Needs assistance    Sitting balance-Leahy Scale: Good     Standing balance support: During functional activity;No upper extremity supported Standing balance-Leahy Scale: Fair               High level balance activites: Side stepping;Backward walking;Direction changes;Turns;Head turns High Level Balance Comments: pt with most difficulty stepping backwards; attempted single leg, tandem, and [place] however pt unable to follow directions            Cognition Arousal/Alertness: Awake/alert Behavior During Therapy: [facility] for tasks assessed/performed;Impulsive Overall Cognitive Status: No family/caregiver present to determine baseline cognitive functioning Area of Impairment: Safety/judgement;Awareness;Problem solving                       Following Commands: Follows one step commands with increased time;Follows multi-step commands inconsistently Safety/Judgement: Decreased awareness of deficits;Decreased awareness of safety   Problem Solving: Requires verbal cues;Difficulty sequencing General Comments: pt climbing out of bed with side rail still up despite cues for safety      Exercises Other Exercises Other Exercises: alternating step ups     General Comments        Pertinent Vitals/Pain Pain Assessment: Faces Faces Pain Scale: Hurts a little bit Pain Descriptors / Indicators: Headache    Home Living     Available Help at Discharge: Family;Friend(s) Type of Home: Apartment              Prior Function            PT Goals (current goals can now be found in the care plan section) Acute Rehab PT Goals Patient Stated Goal: to go home  Progress towards PT goals: Progressing toward goals    Frequency    Min 3X/week      PT Plan Current plan remains appropriate    Co-evaluation              AM-PAC PT "6 Clicks" Mobility   Outcome Measure  Help needed turning from your back to your side while in a flat bed without using bedrails?: None Help needed  moving from lying on your back to sitting on the side of a flat bed without using bedrails?: None Help needed moving to and from a bed to a chair (including a wheelchair)?: A Little Help needed standing up from a chair using your arms (e.g., wheelchair or bedside chair)?: A Little Help needed to walk in hospital room?: A Little Help needed climbing 3-5 steps with a railing? : A Little 6 Click Score: 20    End of Session Equipment Utilized During Treatment: Gait belt Activity Tolerance: Patient tolerated treatment well Patient left: with call bell/phone within reach;in bed;with bed alarm set Nurse Communication: Mobility status PT Visit Diagnosis: Unsteadiness on feet (R26.81);Pain;Other abnormalities of gait and mobility (R26.89);Muscle weakness (generalized) (M62.81)     Time: [id number] PT Time Calculation (min) (ACUTE ONLY): 19 min  Charges:  Neuromuscular re-ed: 8-22 mins                     Earney Navy, PTA Acute Rehabilitation Services Pager: [phone] Office: [phone]     Darliss Cheney 01/13/2019, 4:41 PM

## 2019-01-14 LAB — GLUCOSE, CAPILLARY
Glucose-Capillary: 180 mg/dL — ABNORMAL HIGH (ref 70–99)
Glucose-Capillary: 254 mg/dL — ABNORMAL HIGH (ref 70–99)

## 2019-01-14 LAB — TRIGLYCERIDES: Triglycerides: 81 mg/dL (ref <150)

## 2019-01-14 MED ORDER — LEVETIRACETAM 500 MG PO TABS: 500.0000 mg | ORAL_TABLET | Freq: Two times a day (BID) | ORAL | 0 refills | Status: DC

## 2019-01-14 MED ORDER — ESCITALOPRAM OXALATE 20 MG PO TABS: 20.0000 mg | ORAL_TABLET | Freq: Every day | ORAL | 0 refills | Status: DC

## 2019-01-14 MED ORDER — FOLIC ACID 1 MG PO TABS: 1.0000 mg | ORAL_TABLET | Freq: Every day | ORAL | 1 refills | Status: DC

## 2019-01-14 MED ORDER — PANTOPRAZOLE SODIUM 40 MG PO TBEC: 40.0000 mg | DELAYED_RELEASE_TABLET | Freq: Two times a day (BID) | ORAL | 5 refills | Status: AC

## 2019-01-14 MED ORDER — AMLODIPINE BESYLATE 10 MG PO TABS: 10.0000 mg | ORAL_TABLET | Freq: Every day | ORAL | 1 refills | Status: DC

## 2019-01-14 MED ORDER — CLONAZEPAM 1 MG PO TABS: 1.0000 mg | ORAL_TABLET | Freq: Two times a day (BID) | ORAL | 0 refills | Status: DC

## 2019-01-14 MED ORDER — QUETIAPINE FUMARATE 100 MG PO TABS: 100.0000 mg | ORAL_TABLET | Freq: Two times a day (BID) | ORAL | 1 refills | Status: DC

## 2019-01-14 MED ORDER — THERA VITAL M PO TABS: 1.0000 | ORAL_TABLET | Freq: Every day | ORAL | 0 refills | Status: AC

## 2019-01-14 MED FILL — PANTOPRAZOLE SOD DR 40 MG T: 40 | 30 days supply | Qty: 60 | Fill #0

## 2019-01-14 MED FILL — FOLIC ACID 1 MG TABS: 1 | 30 days supply | Qty: 30 | Fill #0

## 2019-01-14 MED FILL — AMLODIPINE BESYLATE 10 MG T: 10 | 30 days supply | Qty: 30 | Fill #0

## 2019-01-14 MED FILL — levETIRAcetam 500 MG TABS: 500 | 30 days supply | Qty: 60 | Fill #0

## 2019-01-14 MED FILL — CLONAZEPAM 1 MG TABS: 1 | 30 days supply | Qty: 60 | Fill #0

## 2019-01-14 MED FILL — ESCITALOPRAM 20 MG TABLET: 20 | 30 days supply | Qty: 30 | Fill #0

## 2019-01-14 MED FILL — QUETIAPINE FUMARATE 100 MG: 100 | 30 days supply | Qty: 60 | Fill #0

## 2019-01-14 NOTE — Progress Notes (Signed)
Patient at this time no longer needs PIV patient is being discharged

## 2019-01-14 NOTE — Progress Notes (Signed)
Nurse noticed bright red blood on bed pad after patient stood. Another nurse noticed blood in his stool while he was using the bathroom. Dr. Sloan Leiter notified. Orders to hold discharge for now until he can do a rectal exam. Nurse will continue to monitor. [place]

## 2019-01-14 NOTE — Progress Notes (Signed)
Pt. Discharged home via [place] taxi. Patient educated on discharge packet, questions answered. Belongings returned to patient, clothes and phone.

## 2019-01-14 NOTE — TOC Transition Note (Signed)
Transition of Care [facility]) - CM/SW Discharge Note   Patient Details  Name: Willie [name] MRN: [id number] Date of Birth: 1975/10/27  [facility] of [facility]) CM/SW Contact:  Pollie Friar, RN Phone Number: 01/14/2019, 1:36 PM   Clinical Narrative:    Pt discharged home with outpatient therapy. Pt interested in attending [facility]. Orders in Epic and information on the AVS.  CM was able to obtain him an appointment at the [facility]. Information on the AVS. CM also encouraged him to use the [facility] pharmacy for medication assistance.  CM provided him a MATCH letter to assist with the cost of discharge meds.  Family and fiance unable to provide transport home. CM provided him a cab voucher and fiance to meet him at their home.    Final next level of care: OP Rehab Barriers to Discharge: Inadequate or no insurance, Barriers Unresolved (comment)   Patient Goals and CMS Choice   CMS Medicare.gov Compare Post Acute Care list provided to:: Patient Choice offered to / list presented to : Patient  Discharge Placement                       Discharge Plan and Services   Discharge Planning Services: CM Consult Post Acute Care Choice: Home Health                    HH Arranged: PT, OT          Social Determinants of Health (SDOH) Interventions     Readmission Risk Interventions Readmission Risk Prevention Plan 09/14/2018  Transportation Screening Complete  PCP or Specialist Appt within 3-5 Days Not Complete  Not Complete comments no yet ready for d/c  HRI or [place] Not Complete  HRI or Home Care Consult comments no needs identified at this time  Social Work Consult for [place] Planning/Counseling Not Complete  SW consult not completed comments no needs identified at this time  Palliative Care Screening Not Applicable  Medication Review Press photographer) Complete  Some recent data might be hidden

## 2019-01-14 NOTE — Discharge Summary (Signed)
Physician Discharge Summary  Patient ID: Willie [name] MRN: [id number] DOB/AGE: Apr 16, 1976 43 y.o.  Admit date: 01/05/2019 Discharge date: 01/13/2019  Admission Diagnoses: Recurrent subdural hematoma/ abscess  Discharge Diagnoses:  Recurrent SDH/abscess,  Status post re-exploration  Left frontal craniotomy  Active Problems:   Essential hypertension   Mood disorder (HCC)   IDDM (insulin dependent diabetes mellitus) ([place])   Subdural abscess   Malnutrition of moderate degree   Discharged Condition: good  Hospital Course: Willie [name]  Was readmitted September 8th through the ER after his fiancee called reporting decline in self-care.  Approximately 1 month ago he underwent bilateral craniotomy for subdural hematomas and was readmitted August 22nd for left recurrent subdural hematoma which required twist-drill crani and drainage.  Following most recent  Left craniotomy  September 9th,  Patient has recovered,  Improving steadily.   He is ambulating,  Moving all extremities well.   He remains impulsive, not following instructions.   Consults: pulmonary/intensive care  Significant Diagnostic Studies: Head CT  Treatments: surgery: Reexploration of left frontal craniotomy with evacuation of subdural abscess, placement of subdural drain    Discharge Exam: Blood pressure 121/84, pulse (!) 106, temperature (!) 97.5 F (36.4 C), temperature source Axillary, resp. rate 16, weight 74.8 kg, SpO2 99 %.   He is ambulating, moving all extremities well.  He remains impulsive, not following instructions.  He is receiving instruction and recommendations by the diabetes care coordinator and therapies.  Disposition:     Discharge to home with outpatient physical therapy and occupational therapy.   Celesta Gentile has agreed to provide 24 hour care and monitoring.  He will need to return to the office in 2 weeks for staple removal.  Keppra 500 mg b.i.d. will continue.  Seroquel 100 mg b.i.d. with  300 mg at bedtime will continue.      Discharge Instructions    Ambulatory referral to Occupational Therapy   Complete by: As directed    Ambulatory referral to Physical Therapy   Complete by: As directed      Discharge Instructions    Ambulatory referral to Occupational Therapy   Complete by: As directed    Ambulatory referral to Physical Therapy   Complete by: As directed    Diet - low sodium heart healthy   Complete by: As directed    Face-to-face encounter (required for Medicare/Medicaid patients)   Complete by: As directed    I Peggyann Shoals certify that this patient is under my care and that I, or a nurse practitioner or physician's assistant working with me, had a face-to-face encounter that meets the physician face-to-face encounter requirements with this patient on 01/14/2019. The encounter with the patient was in whole, or in part for the following medical condition(s) which is the primary reason for home health care (List medical condition): subdural hematoma   The encounter with the patient was in whole, or in part, for the following medical condition, which is the primary reason for home health care: subdural hematoma   I certify that, based on my findings, the following services are medically necessary home health services: Physical therapy   Reason for Medically Necessary Home Health Services: Therapy- Instruction on use of Assistive Device for Ambulation on all Surfaces   My clinical findings support the need for the above services: Unable to leave home safely without assistance and/or assistive device   Further, I certify that my clinical findings support that this patient is homebound due to: Mental confusion   Home  Health   Complete by: As directed    To provide the following care/treatments:  PT OT     Increase activity slowly   Complete by: As directed      Allergies as of 01/14/2019      Reactions   Invokamet [canagliflozin-metformin Hcl] Other (See  Comments)   Lactic acidosis   Metformin And Related Other (See Comments)   DRASTIC drop in blood sugar      Medication List    TAKE these medications   amLODipine 10 MG tablet Commonly known as: NORVASC Take 1 tablet (10 mg total) by mouth daily.   blood glucose meter kit and supplies Kit Dispense based on patient and insurance preference. Use up to four times daily as directed. (FOR ICD-9 250.00, 250.01).   clonazePAM 1 MG tablet Commonly known as: KLONOPIN Take 1 tablet (1 mg total) by mouth 2 (two) times daily.   escitalopram 20 MG tablet Commonly known as: LEXAPRO Take 1 tablet (20 mg total) by mouth daily.   folic acid 1 MG tablet Commonly known as: FOLVITE Take 1 tablet (1 mg total) by mouth daily.   freestyle lancets 2 (two) times daily   FreeStyle Libre 14 Day Sensor Misc Inject 1 each into the skin every 14 (fourteen) days.   glucose blood test strip Commonly known as: FREESTYLE LITE Use as instructed   ibuprofen 400 MG tablet Commonly known as: ADVIL Take 400 mg by mouth.   insulin lispro 100 UNIT/ML KwikPen Commonly known as: HumaLOG KwikPen Inject subcutaneously 3 times a day (just before each meal) 09-10-08 units Humalog 5 units with small meal, 10 units with medium sized meal, and 15 units with large meal. What changed:   how much to take  how to take this  when to take this  additional instructions   Insulin Pen Needle 31G X 8 MM Misc Commonly known as: UltiCare Short Pen Needles Use once daily to inject insulin.   Lantus SoloStar 100 UNIT/ML Solostar Pen Generic drug: Insulin Glargine Inject 20-25 Units into the skin at bedtime. What changed: how much to take   levETIRAcetam 500 MG tablet Commonly known as: KEPPRA Take 1 tablet (500 mg total) by mouth 2 (two) times daily.   LORazepam 1 MG tablet Commonly known as: ATIVAN Take 1 tablet (1 mg total) by mouth every 8 (eight) hours as needed for anxiety.   multivitamin tablet Take 1  tablet by mouth daily.   ondansetron 4 MG disintegrating tablet Commonly known as: Zofran ODT '4mg'$  ODT q4 hours prn nausea/vomit What changed:   how much to take  how to take this  when to take this  reasons to take this  additional instructions   pantoprazole 40 MG tablet Commonly known as: PROTONIX Take 1 tablet (40 mg total) by mouth 2 (two) times daily.   QUEtiapine 300 MG tablet Commonly known as: SEROQUEL Take 1 tablet (300 mg total) by mouth at bedtime. What changed: Another medication with the same name was added. Make sure you understand how and when to take each.   QUEtiapine 100 MG tablet Commonly known as: SEROQUEL Take 1 tablet (100 mg total) by mouth 2 (two) times daily. What changed: You were already taking a medication with the same name, and this prescription was added. Make sure you understand how and when to take each.   traMADol 50 MG tablet Commonly known as: Ultram Take 1-2 tablets (50-100 mg total) by mouth every 6 (six) hours as needed.  zolpidem 5 MG tablet Commonly known as: AMBIEN Take 5 mg by mouth.      Follow-up Information    [facility].   Specialty: Rehabilitation Why: The outpatient rehab will contact you for the first appointment Contact information: [place] [phone] [place] [address] [place] [facility] up.   Why: Please use this location for your pharmacy needs.  Contact information: [address] [place] [address] [phone]          Signed: Peggyann Shoals, MD 01/14/2019, 8:25 AM

## 2019-01-14 NOTE — Progress Notes (Signed)
PROGRESS NOTE  Willie [name]  DOB: March 11, 1976  PCP: Debbrah Alar, NP FVC:[id number]  DOA: 01/05/2019  LOS: 8 days   Brief narrative: Willie [name] is an 43 y.o. male with past medical history significant for IDDM, HTN, HLD, EtOH abuse, recurrent EtOH pancreatitis, anxiety, depression, GERD, and subdural hematoma; who presented with altered mental status on 9/8.  Patient had initially sustained a subdural hematoma on July 18 in [place],  for which he was admitted but left AMA.  Then admitted from July 28 through August 1 requiring bilateral craniotomies by Dr. Vertell Limber.  He was readmitted into the hospital from August 22-30 for confusion, found to have recurrent left-sided subdural hematoma for which a subdural drain was placed and then removed on 8/25.  Initial imaging studies during this admission revealed fluid collection of the fronto-temporal lobe of the brain requiring emergent drainage in the OR. Intraoperative findings consistent with a subdural empyema.  Patient was in the ICU under the care of critical care, but was able to be extubated on 9/11 and transferred to the floor.  Speech therapy evaluate the patient and he was initially placed on a dysphasia diet but able to be transitioned to a regular diet with nectar thick liquids.  Medical consultation was called on 9/14 for comanagement of medical issues.  Subjective: Patient was seen and examined at the bedside.  Denies any complaints. He is willing to go home. He will take uber to go home.  Suggested to use tylenol for pain   Assessment/Plan: Subdural empyema:  Patient status post operative drainage of the frontal temporal lobe on 9/9 per neurosurgery.   -Negative cultures -Clinically improved. -Finish 7 days of antibiotic therapy. -Continue PT/OT  Dysphagia and expressive aphasia: Speech therapy  switched diet to regular with nectar thick liquids and medications crushed with pure. -Aspiration precautions.  Improved.    Diabetes mellitus type 2:  -Patient's last hemoglobin A1c noted to be 6.9 on 11/23/2018.   -On sliding scale insulin with Accu-Cheks.  Blood sugars slightly unpredictable.  Have advised to take half dose of long acting insulin until he has a good appetite and blood sugars are consistently in good range.  Normocytic anemia:  -Hemoglobin is stable.  Essential hypertension -Continue amlodipine.  Is stable  Combative behavior with history of alcohol abuse:  Stabilizing now. -Continue Seroquel, Lexapro, Klonopin, folic acid, and thiamine  Hypokalemia -Replaced and improved.  He is medically stable . Updated surgery team.   Body mass index is 22.38 kg/m. Mobility: PT eval DVT prophylaxis:  SCDs Code Status:   Code Status: Full Code  Family Communication:  None. Expected Discharge:  home today   Antimicrobials: Anti-infectives (From admission, onward)   Start     Dose/Rate Route Frequency Ordered Stop   01/07/19 2200  ceFEPIme (MAXIPIME) 2 g in sodium chloride 0.9 % 100 mL IVPB  Status:  Discontinued     2 g 200 mL/hr over 30 Minutes Intravenous Every 8 hours 01/07/19 1618 01/12/19 1107   01/07/19 2100  metroNIDAZOLE (FLAGYL) IVPB 500 mg  Status:  Discontinued     500 mg 100 mL/hr over 60 Minutes Intravenous Every 8 hours 01/07/19 1618 01/12/19 1107   01/06/19 1600  vancomycin (VANCOCIN) 1,500 mg in sodium chloride 0.9 % 500 mL IVPB  Status:  Discontinued     1,500 mg 250 mL/hr over 120 Minutes Intravenous Every 12 hours 01/06/19 1355 01/12/19 1107   01/06/19 1215  piperacillin-tazobactam (ZOSYN) IVPB 3.375 g  Status:  Discontinued     3.375 g 12.5 mL/hr over 240 Minutes Intravenous Every 8 hours 01/06/19 1207 01/06/19 1526   01/06/19 1200  clindamycin (CLEOCIN) IVPB 300 mg     300 mg 100 mL/hr over 30 Minutes Intravenous Every 6 hours 01/06/19 1155 01/07/19 0318   01/06/19 1200  cefTRIAXone (ROCEPHIN) 2 g in sodium chloride 0.9 % 100 mL IVPB  Status:   Discontinued     2 g 200 mL/hr over 30 Minutes Intravenous Every 12 hours 01/06/19 1155 01/07/19 1618   01/06/19 0745  piperacillin-tazobactam (ZOSYN) IVPB 3.375 g  Status:  Discontinued     3.375 g 100 mL/hr over 30 Minutes Intravenous Every 6 hours 01/06/19 0734 01/06/19 1206   01/06/19 0630  bacitracin 50,000 Units in sodium chloride 0.9 % 500 mL irrigation  Status:  Discontinued       As needed 01/06/19 0630 01/06/19 0741      Diet Order            Diet - low sodium heart healthy        Diet Carb Modified Fluid consistency: Nectar Thick; Room service appropriate? No  Diet effective now              Infusions:    Scheduled Meds: . sodium chloride   Intravenous Once  . sodium chloride   Intravenous Once  . amLODipine  10 mg Oral Daily  . chlorhexidine gluconate (MEDLINE KIT)  15 mL Mouth Rinse BID  . Chlorhexidine Gluconate Cloth  6 each Topical Daily  . clonazePAM  1 mg Oral BID  . escitalopram  20 mg Oral Daily  . feeding supplement (ENSURE ENLIVE)  237 mL Oral BID BM  . folic acid  1 mg Oral Daily  . insulin aspart  0-15 Units Subcutaneous TID WC  . insulin aspart  0-5 Units Subcutaneous QHS  . insulin detemir  5 Units Subcutaneous Daily  . levETIRAcetam  500 mg Oral BID  . mouth rinse  15 mL Mouth Rinse BID  . multivitamin with minerals  1 tablet Oral Daily  . pantoprazole sodium  40 mg Oral QHS  . QUEtiapine  100 mg Oral BID  . QUEtiapine  300 mg Oral QHS  . sodium chloride flush  10-40 mL Intracatheter Q12H  . thiamine  100 mg Oral Daily    PRN meds: acetaminophen **OR** acetaminophen, labetalol, LORazepam, LORazepam, naLOXone (NARCAN)  injection, ondansetron **OR** ondansetron (ZOFRAN) IV, promethazine, Resource ThickenUp Clear, sodium chloride flush   Objective: Vitals:   01/14/19 0417 01/14/19 0743  BP: 121/88 (!) 130/99  Pulse: 79 94  Resp: 18 15  Temp: 98.4 F (36.9 C) 98 F (36.7 C)  SpO2: 100% 100%    Intake/Output Summary (Last 24 hours)  at 01/14/2019 0952 Last data filed at 01/14/2019 0739 Gross per 24 hour  Intake 478 ml  Output -  Net 478 ml   Filed Weights   01/06/19 0408 01/06/19 0425  Weight: 74.8 kg 74.8 kg   Weight change:  Body mass index is 22.38 kg/m.   Physical Exam: General exam: Appears calm and comfortable.  On room air. Skin: No rashes, lesions or ulcers. HEENT: Surgical scar seen in the scalp on left temporal region , stable and nontender. Lungs: Clear to auscultation bilaterally CVS: Regular rate and rhythm, no murmur GI/Abd soft, nontender, nondistended, bowel sound present CNS: Alert, awake, oriented to place and person, he has some cognitive delay. Psychiatry: Mood appropriate at this time.  Not  restless or violent Extremities: No pedal edema, no calf tenderness  Data Review: I have personally reviewed the laboratory data and studies available.  Recent Labs  Lab 01/08/19 0400 01/09/19 0437 01/12/19 0332  WBC 10.1 8.2 7.5  NEUTROABS 7.4 6.5 5.0  HGB 9.9* 10.3* 9.8*  HCT 31.0* 31.6* 28.9*  MCV 87.8 86.3 85.3  PLT 750* 566* 599*   Recent Labs  Lab 01/08/19 0400 01/09/19 0437 01/12/19 0332 01/13/19 0632  NA 139 137 139 139  K 3.9 3.6 3.2* 3.6  CL 111 106 101 106  CO2 21* _0 GLUCOSE 151* 108* 105* 108*  BUN 5* <5* <5* <5*  CREATININE 0.53* 0.57* 0.48* 0.47*  CALCIUM 8.0* 8.1* 8.5* 8.7*  MG 1.8 1.5* 1.7  --   PHOS 5.2* 4.3  --   --    Total time spent: 20 minutes.   Barb Merino, MD  [facility] 01/14/2019

## 2019-01-14 NOTE — Progress Notes (Signed)
I reevaluated patient with complaint of streaks of blood noted on the bedsheet.  Patient stated history of internal hemorrhoids and had colonoscopy 2012 with some nonbleeding diverticulosis. Patient does get intermittent streaks of blood with bowel movement especially when he has multiple bowel movements and this has been going for many years now. On rectal exam: No visible mass, no tenderness, no visible hemorrhoids. Some streaks of blood smeared on glove fingers with loose stool. This is probably outlet bleeding which is usually self-limiting.  Asked patient to monitor for it and discussed with his gastroenterologist if continues to have problem.  No further intervention needed.

## 2019-01-14 NOTE — Progress Notes (Signed)
Pt refused morning labs.  Lab will attempt at a later time. Will continue to monitor for safety.

## 2019-01-14 NOTE — Progress Notes (Signed)
Subjective: Patient reports doing OK.  Wants to go home.  Objective: Vital signs in last 24 hours: Temp:  [97.5 F (36.4 C)-98.5 F (36.9 C)] 98 F (36.7 C) (09/17 0743) Pulse Rate:  [79-109] 94 (09/17 0743) Resp:  [15-19] 15 (09/17 0743) BP: (116-130)/(82-99) 130/99 (09/17 0743) SpO2:  [95 %-100 %] 100 % (09/17 0743)  Intake/Output from previous day: 09/16 0701 - 09/17 0700 In: 358 [P.O.:355; I.V.:3] Out: 800 [Urine:800] Intake/Output this shift: Total I/O In: 120 [P.O.:120] Out: -   Physical Exam: Awake, alert, conversant.  MAEW.  Lab Results: Recent Labs    01/12/19 0332  WBC 7.5  HGB 9.8*  HCT 28.9*  PLT 599*   BMET Recent Labs    01/12/19 0332 01/13/19 0632  NA 139 139  K 3.2* 3.6  CL 101 106  CO2 28 26  GLUCOSE 105* 108*  BUN <5* <5*  CREATININE 0.48* 0.47*  CALCIUM 8.5* 8.7*    Studies/Results: Dg Swallowing Func-speech Pathology  Result Date: 01/12/2019 Objective Swallowing Evaluation: Type of Study: MBS-Modified Barium Swallow Study  Patient Details Name: QUANG KUBES MRN: [id number] Date of Birth: [birth year] Today's Date: 01/12/2019 Time: SLP Start Time (ACUTE ONLY): 1130 -SLP Stop Time (ACUTE ONLY): 1142 SLP Time Calculation (min) (ACUTE ONLY): 12 min Past Medical History: Past Medical History: Diagnosis Date . Alcohol abuse  . Alcoholic pancreatitis   recurrent . Depression  . Diabetes mellitus, type II ([place])   New Onset 03/2010 . GERD (gastroesophageal reflux disease)  . High cholesterol  . 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 Hypertension  . Sleep apnea   does not wear a CPAP Past Surgical History: Past Surgical History: Procedure Laterality Date . COLONOSCOPY W/ BIOPSIES  09/2010  Dr. Ardis Hughs.  for intermittent rectal bleeding.  Mild sigmoid to descending diverticulosis.  Mild left colon erythema, benign biopsy, probably "prep effect" . COLONOSCOPY WITH PROPOFOL N/A 07/06/2017  Procedure: COLONOSCOPY  WITH PROPOFOL;  Surgeon: Jerene Bears, MD;  Location: [facility];  Service: Gastroenterology;  Laterality: N/A; . CRANIOTOMY Bilateral 11/24/2018  Procedure: Bilateral craniotomy for evacuation of subdural hematoma;  Surgeon: Erline Levine, MD;  Location: [facility];  Service: Neurosurgery;  Laterality: Bilateral;  Bilateral craniotomy for evacuation of subdural hematoma . CRANIOTOMY Left 01/06/2019  Procedure: CRANIECTOMY;  Surgeon: Earnie Larsson, MD;  Location: [facility];  Service: Neurosurgery;  Laterality: Left; . LUMBAR MICRODISCECTOMY  ~ 2004  Dr Ellene Route HPI: 43 year old male status post prior bilateral craniotomies for subdural by Dr. Vertell Limber.  Patient with recurrent left-sided subdural hematoma status post drain placement and secondary evacuation by Dr. Saintclair Halsted little over 10 days ago.  The patient now presents with worsening confusion and agitation.  CT scan done [facility] emergency room demonstrates a large left frontal subdural hematoma which appears to be subacute in nature.  There is marked mass-effect with significant left to right shift.  There is no evidence of obvious acute hemorrhage.  The patient has no history of fever or other signs of infection.  No documented seizure activity  Subjective: alert, cooperative Assessment / Plan / Recommendation CHL IP CLINICAL IMPRESSIONS 01/12/2019 Clinical Impression Pt has a mild oropharyngeal dysphagia with timing and sensory components. He has mildly reduced A/P transit orally, leaving mild oral residuals that he clears spontaneously with a second swallow. He has good airway protection regardless of consistency when he takes single boluses - even large ones. However, when he is  allowed to self-regulate and begins drinking more continuously, he has silent aspiration of thin liquids during the swallow. A cued cough does not eject aspirates from the airway. Given the impulsivity that has been observed at bedside, would continue with nectar thick liquids for now, as it  increases his independence with self-feeding. SLP will f/u for trials of thin liquids with good potential to advance if he can self-regulate pace.  SLP Visit Diagnosis Dysphagia, oropharyngeal phase (R13.12) Attention and concentration deficit following -- Frontal lobe and executive function deficit following -- Impact on safety and function Mild aspiration risk   CHL IP TREATMENT RECOMMENDATION 01/12/2019 Treatment Recommendations Therapy as outlined in treatment plan below   Prognosis 01/12/2019 Prognosis for Safe Diet Advancement Good Barriers to Reach Goals -- Barriers/Prognosis Comment -- CHL IP DIET RECOMMENDATION 01/12/2019 SLP Diet Recommendations Regular solids;Nectar thick liquid Liquid Administration via Cup;Straw Medication Administration Whole meds with puree Compensations Small sips/bites;Slow rate Postural Changes Seated upright at 90 degrees   CHL IP OTHER RECOMMENDATIONS 01/12/2019 Recommended Consults -- Oral Care Recommendations Oral care BID Other Recommendations --   CHL IP FOLLOW UP RECOMMENDATIONS 01/12/2019 Follow up Recommendations Outpatient SLP;Home health SLP;24 hour supervision/assistance   CHL IP FREQUENCY AND DURATION 01/12/2019 Speech Therapy Frequency (ACUTE ONLY) min 2x/week Treatment Duration 2 weeks      CHL IP ORAL PHASE 01/12/2019 Oral Phase Impaired Oral - Pudding Teaspoon -- Oral - Pudding Cup -- Oral - Honey Teaspoon -- Oral - Honey Cup -- Oral - Nectar Teaspoon -- Oral - Nectar Cup -- Oral - Nectar Straw Reduced posterior propulsion;Lingual/palatal residue Oral - Thin Teaspoon -- Oral - Thin Cup Reduced posterior propulsion;Lingual/palatal residue Oral - Thin Straw Reduced posterior propulsion;Lingual/palatal residue Oral - Puree Reduced posterior propulsion;Lingual/palatal residue Oral - Mech Soft -- Oral - Regular -- Oral - Multi-Consistency -- Oral - Pill Reduced posterior propulsion Oral Phase - Comment --  CHL IP PHARYNGEAL PHASE 01/12/2019 Pharyngeal Phase Impaired Pharyngeal-  Pudding Teaspoon -- Pharyngeal -- Pharyngeal- Pudding Cup -- Pharyngeal -- Pharyngeal- Honey Teaspoon -- Pharyngeal -- Pharyngeal- Honey Cup -- Pharyngeal -- Pharyngeal- Nectar Teaspoon -- Pharyngeal -- Pharyngeal- Nectar Cup -- Pharyngeal -- Pharyngeal- Nectar Straw WFL Pharyngeal -- Pharyngeal- Thin Teaspoon -- Pharyngeal -- Pharyngeal- Thin Cup Penetration/Aspiration during swallow Pharyngeal Material enters airway, passes BELOW cords without attempt by patient to eject out (silent aspiration) Pharyngeal- Thin Straw Penetration/Aspiration during swallow Pharyngeal Material enters airway, passes BELOW cords without attempt by patient to eject out (silent aspiration) Pharyngeal- Puree WFL Pharyngeal -- Pharyngeal- Mechanical Soft WFL Pharyngeal -- Pharyngeal- Regular -- Pharyngeal -- Pharyngeal- Multi-consistency -- Pharyngeal -- Pharyngeal- Pill WFL Pharyngeal -- Pharyngeal Comment --  CHL IP CERVICAL ESOPHAGEAL PHASE 01/12/2019 Cervical Esophageal Phase WFL Pudding Teaspoon -- Pudding Cup -- Honey Teaspoon -- Honey Cup -- Nectar Teaspoon -- Nectar Cup -- Nectar Straw -- Thin Teaspoon -- Thin Cup -- Thin Straw -- Puree -- Mechanical Soft -- Regular -- Multi-consistency -- Pill -- Cervical Esophageal Comment -- Venita Sheffield Nix 01/12/2019, 12:50 PM  Pollyann Glen, M.A. [facility] Pager [phone] Office [phone]              Assessment/Plan: Patient is stable.  Home with 24 hour supervision by girlfriend and with visiting PT and OT.  Diabetes coordinator to coordinate home insulin dosing and sliding scale.    LOS: 8 days    Peggyann Shoals, MD 01/14/2019, 8:22 AM

## 2019-01-19 ENCOUNTER — Inpatient Hospital Stay: Payer: Self-pay | Admitting: Family Medicine

## 2019-02-03 ENCOUNTER — Encounter: Payer: Self-pay | Admitting: Occupational Therapy

## 2019-02-03 ENCOUNTER — Other Ambulatory Visit: Payer: Self-pay

## 2019-02-03 ENCOUNTER — Telehealth: Payer: Self-pay | Admitting: Family

## 2019-02-03 ENCOUNTER — Ambulatory Visit: Payer: Self-pay | Admitting: Occupational Therapy

## 2019-02-03 ENCOUNTER — Ambulatory Visit: Payer: Self-pay | Attending: Neurosurgery | Admitting: Rehabilitative and Restorative Service Providers"

## 2019-02-03 DIAGNOSIS — R41844 Frontal lobe and executive function deficit: Secondary | ICD-10-CM | POA: Insufficient documentation

## 2019-02-03 DIAGNOSIS — R41840 Attention and concentration deficit: Secondary | ICD-10-CM | POA: Insufficient documentation

## 2019-02-03 DIAGNOSIS — M6281 Muscle weakness (generalized): Secondary | ICD-10-CM

## 2019-02-03 DIAGNOSIS — R29818 Other symptoms and signs involving the nervous system: Secondary | ICD-10-CM | POA: Insufficient documentation

## 2019-02-03 DIAGNOSIS — R41842 Visuospatial deficit: Secondary | ICD-10-CM | POA: Insufficient documentation

## 2019-02-03 DIAGNOSIS — R2681 Unsteadiness on feet: Secondary | ICD-10-CM

## 2019-02-03 DIAGNOSIS — R2689 Other abnormalities of gait and mobility: Secondary | ICD-10-CM | POA: Insufficient documentation

## 2019-02-03 MED ORDER — FOLIC ACID 1 MG PO TABS: 1.0000 mg | ORAL_TABLET | Freq: Every day | ORAL | 0 refills | Status: DC

## 2019-02-03 NOTE — Telephone Encounter (Addendum)
Pt is requesting a refill for :   Sildenafil citrate 20 MG   Pharmacy: [facility] on [address]  And then,   folic acid (FOLVITE) 1 MG tablet  Pharmacy:  [facility] [address] [place], [place] - [address] [phone] (Phone) [phone] (Fax)     ALSO, pt says that he has had 3 brain surgeries and was given Advil for the pain, pt would like to know if PCP would prescribe something stronger to help his pain? If so, pt would like to have it sent to the [facility] as well.    CB: [phone]

## 2019-02-03 NOTE — Telephone Encounter (Signed)
Folic acid refilled.  Can you address the refill for sildenafil and the question about something stronger for pain?

## 2019-02-04 ENCOUNTER — Telehealth: Payer: Self-pay | Admitting: Rehabilitative and Restorative Service Providers"

## 2019-02-04 NOTE — Therapy (Signed)
[facility] [address] [name], [place], [address] Phone: [phone]   Fax:  [phone]  Occupational Therapy Evaluation  Patient Details  Name: Willie [name] MRN: [id number] Date of Birth: January 22, 1976 Referring Provider (OT): Dr. Macarthur Critchley Date: 02/03/2019  OT End of Session - 02/03/19 1029    Visit Number  1    Number of Visits  17    Date for OT Re-Evaluation  04/05/19    Authorization Type  self pay    OT Start Time  1025    OT Stop Time  1100    OT Time Calculation (min)  35 min    Activity Tolerance  Patient limited by fatigue    Behavior During Therapy  Flat affect       Past Medical History:  Diagnosis Date  . Alcohol abuse   . Alcoholic pancreatitis    recurrent  . Depression   . Diabetes mellitus, type II ([place])    New Onset 03/2010  . GERD (gastroesophageal reflux disease)   . High cholesterol   . 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 Hypertension   . Sleep apnea    does not wear a CPAP    Past Surgical History:  Procedure Laterality Date  . COLONOSCOPY W/ BIOPSIES  09/2010   Dr. Ardis Hughs.  for intermittent rectal bleeding.  Mild sigmoid to descending diverticulosis.  Mild left colon erythema, benign biopsy, probably "prep effect"  . COLONOSCOPY WITH PROPOFOL N/A 07/06/2017   Procedure: COLONOSCOPY WITH PROPOFOL;  Surgeon: Jerene Bears, MD;  Location: [facility];  Service: Gastroenterology;  Laterality: N/A;  . CRANIOTOMY Bilateral 11/24/2018   Procedure: Bilateral craniotomy for evacuation of subdural hematoma;  Surgeon: Erline Levine, MD;  Location: [facility];  Service: Neurosurgery;  Laterality: Bilateral;  Bilateral craniotomy for evacuation of subdural hematoma  . CRANIOTOMY Left 01/06/2019   Procedure: CRANIECTOMY;  Surgeon: Earnie Larsson, MD;  Location: [facility];  Service: Neurosurgery;  Laterality: Left;  . LUMBAR MICRODISCECTOMY  ~  2004   Dr Ellene Route    There were no vitals filed for this visit.  Subjective Assessment - 02/03/19 1027    Subjective   Pt reports he drove himself today    Pertinent History  This 43 y.o male s/p  frontal SDH underwent L frontal craniotomy with evacuation of subdural abcess on 01/06/2019.  PMH includes s/p bil. craniotomies for SDH 05/2018) with recurrent Lt sided SDH s/p drain placement and secondary evacuation   PMH includes:  ETOH abuse, alcoholic pancreatitis, DM, sleep apnea, h/o back pain    Patient Stated Goals  to be able speak, strength in R side    Currently in Pain?  Yes    Pain Score  3     Pain Location  Back    Pain Descriptors / Indicators  Aching    Pain Type  Acute pain    Pain Onset  More than a month ago    Pain Frequency  Intermittent    Aggravating Factors   standing    Pain Relieving Factors  rest        [facility] OT Assessment - 02/04/19 0001      Assessment   Medical Diagnosis  subdural hematoma    Referring Provider (OT)  Dr. Vertell Limber    Onset Date/Surgical Date  01/06/19    Hand Dominance  Right    Prior  Therapy  prior therapy on acute care      Precautions   Precautions  Fall      Balance Screen   Has the patient fallen in the past 6 months  Yes    How many times?  20    Has the patient had a decrease in activity level because of a fear of falling?   Yes    Is the patient reluctant to leave their home because of a fear of falling?   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      ADL   Eating/Feeding  Modified independent    Grooming  Modified independent    Upper Body Bathing  Supervision/safety    Lower Body Bathing  Supervision/safety    Upper Body Dressing  Supervision/safety    Lower  Body Dressing  Supervision/safety    Toilet Transfer  Modified independent    Tub/Shower Transfer  Modified independent      IADL   Meal Prep  --   reports cooking, supervision recommended   Medication Management  Has difficulty remembering to take medication      Mobility   Mobility Status  History of falls   supervision   Mobility Status Comments  supervision      Written Expression   Handwriting  100% legible   for writing name     [place] not tested    Alignment/Gaze Preference  Gaze Right    Comment  L inattention, reports bumping items on L side, 100% with number cancellation task      Cognition   Overall Cognitive Status  Impaired/Different from baseline    Memory  Impaired    Memory Impairment  Decreased recall of new information;Decreased short term memory   2/5 words recalled   Awareness  Impaired    Problem Solving  Impaired    Cognition Comments  Delayed response time, decreased safety awareness      Sensation   Light Touch  Appears Intact      Coordination   9 Hole Peg Test  --    Left 9 Hole Peg Test  --      Perception   Perception  Impaired    Inattention/Neglect  Does not attend to left visual field      ROM / Strength   AROM / PROM / Strength  AROM      AROM   Overall AROM   Deficits    Overall AROM Comments  mildly decrease RUE shoulder flexion      Strength   Overall Strength  Deficits    Overall Strength Comments  RUE 3+/5, LUE 4+/5-5/5      Hand Function   Right Hand Grip (lbs)  39    Left Hand Grip (lbs)  55                      OT Education - 02/04/19 1619    Education Details  Recommendation that pt does not drive due to cognitve deficits and delayed reaction time. Pt drove himself to therapy today.  Therapist offered transportation home by taxi, however pt reports he called an [employer], [employer].(Pt was later witnessed by another staff member getting into his car and driving away).     Person(s) Educated  Patient    Methods  Explanation    Comprehension  Verbalized understanding;Need further instruction       OT Short Term Goals - 02/04/19 1641      OT SHORT TERM GOAL #1   Title  I with inital HEP.    Time  4    Period  Weeks    Status  New      OT SHORT TERM GOAL #2   Title  Pt will verbalzied understanding of compensatory strategies for short term memory deficits.    Time  4    Period  Weeks      OT SHORT TERM GOAL #3   Title  Pt will perfrom basic environmental scanning tasks with 90% or better accuracy.    Time  4    Period  Weeks    Status  New      OT SHORT TERM GOAL #4   Title  Pt will perform functional activity requiring sequencing with no more than min v.c    Time  4    Period  Weeks    Status  New      OT SHORT TERM GOAL #5   Title  Pt will perfrom basic cooking task with supervision and min v.c    Time  4    Period  Weeks    Status  New        OT Long Term Goals - 02/04/19 1643      OT LONG TERM GOAL #1   Title  I with updated HEP.    Time  8    Period  Weeks    Status  New      OT LONG TERM GOAL #2   Title  Pt will perfrom mod complex environmental scanning task with 90% or better accuracy.    Time  8    Period  Weeks    Status  New      OT LONG TERM GOAL #3   Title  Pt will perform a physical  and cognitive task simultaneously with  no more than min v.c    Time  8    Period  Weeks    Status  New      OT LONG TERM GOAL #4   Title  Pt will perform basic cooking modified independently demonstrating good safety awareness.    Time  8    Period  Weeks    Status  New      OT LONG TERM GOAL #5   Title  Pt will increase RUE grip strength by 5 lbs for increased ease with functional use.    Time  8    Period  Weeks    Status  New            Plan - 02/04/19 1636    Clinical Impression Statement  This 42 y.o male s/p  frontal SDH underwent L frontal craniotomy with evacuation of subdural abcess on 01/06/2019.  PMH  includes s/p bil. craniotomies for SDH 05/2018) with recurrent Lt sided SDH s/p drain placement and secondary evacuation   PMH includes:  ETOH abuse, alcoholic pancreatitis, DM, sleep apnea, h/o back pain. Pt presetns to occupational therapy with the following deficits: decreased UE strength, L inattention, cognitive deficits, decreased coordination, decreased balance,  decreased functional mobility which impedes performance of ADLs/ IADLs. Pt can benefit from skilled occupational therapy to address these deficitis in order to maximize pt's safety and independence with ADLs/ IADLs. Of note,  pt drove himself to therapy today, despite significant cogntive deficits.    OT Occupational Profile and History  Detailed Assessment- Review of Records and additional review of physical, cognitive, psychosocial history related to current functional performance    Occupational performance deficits (Please refer to evaluation for details):  ADL's;IADL's;Work;Leisure;Social Participation    Body Structure / Function / Physical Skills  ADL;UE functional use;FMC;ROM;Gait;GMC;Decreased knowledge of precautions;Decreased knowledge of use of DME;IADL;Dexterity;Strength;Mobility    Cognitive Skills  Attention;Memory;Problem Solve;Safety Awareness;Sequencing;Thought    Rehab Potential  Fair    Clinical Decision Making  Several treatment options, min-mod task modification necessary    Comorbidities Affecting Occupational Performance:  May have comorbidities impacting occupational performance    Modification or Assistance to Complete Evaluation   Min-Moderate modification of tasks or assist with assess necessary to complete eval    OT Frequency  2x / week    OT Duration  8 weeks   plus eval   OT Treatment/Interventions  Self-care/ADL training;Visual/perceptual remediation/compensation;Patient/family education;DME and/or AE instruction;Paraffin;Gait Training;Passive range of motion;Balance training;Fluidtherapy;Wellsite geologist;Therapeutic activities;Manual Therapy;Therapeutic exercise;Moist Heat;Neuromuscular education;Cognitive remediation/compensation    Plan  environmental scanning, putty exercises RUE, functional activities requiring visual perceptual skills(pipe tree)    Consulted and Agree with Plan of Care  Patient       Patient will benefit from skilled therapeutic intervention in order to improve the following deficits and impairments:   Body Structure / Function / Physical Skills: ADL, UE functional use, FMC, ROM, Gait, GMC, Decreased knowledge of precautions, Decreased knowledge of use of DME, IADL, Dexterity, Strength, Mobility Cognitive Skills: Attention, Memory, Problem Solve, Safety Awareness, Sequencing, Thought     Visit Diagnosis: Muscle weakness (generalized) - Plan: Ot plan of care cert/re-cert  Frontal lobe and executive function deficit - Plan: Ot plan of care cert/re-cert  Attention and concentration deficit - Plan: Ot plan of care cert/re-cert  Other abnormalities of gait and mobility - Plan: Ot plan of care cert/re-cert  Unsteadiness on feet - Plan: Ot plan of care cert/re-cert  Visuospatial deficit - Plan: Ot plan of care cert/re-cert    Problem List Patient Active Problem List   Diagnosis Date Noted  . Malnutrition of moderate degree 01/09/2019  . 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 Subdural bleeding ([place]) 11/24/2018  . Abdominal pain 11/22/2018  . SDH (subdural hematoma) ([place]) 11/22/2018  . Abnormal LFTs 11/22/2018  . IDDM (insulin dependent diabetes mellitus) 09/12/2018  . Acute hepatitis 09/11/2018  . Alcohol-induced mood disorder ([place])   . DKA (diabetic ketoacidoses) ([place]) 07/28/2017  . Alcohol withdrawal ([place]) 07/28/2017  . Abnormal CT scan, colon   . Benign neoplasm of descending colon   . Lower GI bleed 07/04/2017  . Transaminitis 07/04/2017  . Pain   . Bleeding internal hemorrhoids   . Anemia of chronic disease   . Left arm weakness   . Anemia, blood  loss   . Vomiting alone 03/27/2017  . Chest pain 03/27/2017  . Nausea and vomiting   . Lactic acidosis 03/26/2017  . History of alcohol abuse 06/25/2016  . Tobacco abuse 06/25/2016  . Anxiety state 11/20/2015  . Dental infection 07/10/2015  . Mood disorder ([place]) 04/18/2015  . Eczema 06/27/2014  . GERD (gastroesophageal reflux disease) 05/24/2014  . Snoring 04/07/2013  . Suicide attempt by substance overdose ([place])  02/10/2013  . Alcohol abuse 12/31/2012  . Erectile dysfunction 12/09/2012  . Hematuria 06/11/2012  . Vision problem 02/02/2012  . Insomnia 12/20/2011  . Chronic pancreatitis ([place]) 02/05/2011  . Depression with anxiety 11/13/2010  . Hyperlipemia 06/27/2010  . Diabetes type 2, uncontrolled ([place]) 05/22/2010  . Essential hypertension 05/22/2010    , 02/04/2019, 4:57 PM Theone Murdoch, OTR/L [phone] Phone: [phone] 4:57 PM 02/04/19 [facility] [address] [place], [place], [address] Phone: [phone]   Fax:  [phone]  Name: Willie [name] MRN: [id number] Date of Birth: 1975-08-04

## 2019-02-04 NOTE — Telephone Encounter (Signed)
Called but no answer, information left about folic acid refill sent to pharmacy. Unable to change pain medication to something stronger.

## 2019-02-04 NOTE — Telephone Encounter (Signed)
Dr. Vertell Limber, Herold Harms was evaluated by PT on 02/03/19.  The patient would benefit from ST evaluation s/p craniotomy..   If you agree, please place an order in [facility]que in [facility] or fax the order to [phone]. Thank you, [place], [place] [address] [place], [place]  [address] Phone:  [phone] Fax:  [phone]

## 2019-02-04 NOTE — Telephone Encounter (Signed)
I am unable to give him anything stronger for pain. He should contact his neurosurgeon if pain is uncontrolled. Typically pain medicine is only used for 5 days after surgery though due to risk of being habit forming.

## 2019-02-04 NOTE — Therapy (Signed)
[facility] [address] [place], [place], [address] Phone: [phone]   Fax:  [phone]  Physical Therapy Evaluation  Patient Details  Name: Willie [name] MRN: [id number] Date of Birth: 26-Jun-1975 Referring Provider (PT): Erline Levine, MD / Primary care Debbrah Alar, NP   Encounter Date: 02/03/2019  PT End of Session - 02/03/19 1109    Visit Number  1    Number of Visits  12    Date for PT Re-Evaluation  04/05/19    Authorization Type  medicaid pending (currently self care per notes)    PT Start Time  0938    PT Stop Time  1020    PT Time Calculation (min)  42 min    Activity Tolerance  Patient tolerated treatment well    Behavior During Therapy  [facility] for tasks assessed/performed       Past Medical History:  Diagnosis Date  . Alcohol abuse   . Alcoholic pancreatitis    recurrent  . Depression   . Diabetes mellitus, type II ([place])    New Onset 03/2010  . GERD (gastroesophageal reflux disease)   . High cholesterol   . 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 Hypertension   . Sleep apnea    does not wear a CPAP    Past Surgical History:  Procedure Laterality Date  . COLONOSCOPY W/ BIOPSIES  09/2010   Dr. Ardis Hughs.  for intermittent rectal bleeding.  Mild sigmoid to descending diverticulosis.  Mild left colon erythema, benign biopsy, probably "prep effect"  . COLONOSCOPY WITH PROPOFOL N/A 07/06/2017   Procedure: COLONOSCOPY WITH PROPOFOL;  Surgeon: Jerene Bears, MD;  Location: [facility];  Service: Gastroenterology;  Laterality: N/A;  . CRANIOTOMY Bilateral 11/24/2018   Procedure: Bilateral craniotomy for evacuation of subdural hematoma;  Surgeon: Erline Levine, MD;  Location: [facility];  Service: Neurosurgery;  Laterality: Bilateral;  Bilateral craniotomy for evacuation of subdural hematoma  . CRANIOTOMY Left 01/06/2019   Procedure: CRANIECTOMY;  Surgeon:  Earnie Larsson, MD;  Location: [facility];  Service: Neurosurgery;  Laterality: Left;  . LUMBAR MICRODISCECTOMY  ~ 2004   Dr Ellene Route    There were no vitals filed for this visit.   Subjective Assessment - 02/03/19 0944    Subjective  The patient has h/o SDH 2/20 secondary to a fall.  He had another fall in 11/2018 and reports refusing medical treatment while in [facility].  He presented to [facility] in September due to altered mental status and seizures.  He had a recent subdural abcess with evacuation 01/06/2019.  Since d/c home on 9/17, he reports balance and walking are "hit or miss".  He is not using an assistive device.    Pertinent History  subdural hematoma, DM-2, HTN, OSA, alcohol use disorder, alcoholic pancreatitis, depression and chronic pain    Patient Stated Goals  Being able to recall items and speech therapy.    Currently in Pain?  Yes   "extreme pain" L side of head at night   Pain Score  0-No pain   currently without pain   Pain Location  Head    Pain Orientation  Left    Pain Type  Chronic pain    Pain Frequency  Intermittent    Aggravating Factors   Lying down at night    Pain Relieving Factors  improved during the day/ has Advil for pain mgmt (not  helping)         [facility] PT Assessment - 02/03/19 0953      Assessment   Medical Diagnosis  subdural hematoma    Referring Provider (PT)  Erline Levine, MD / Primary care Debbrah Alar, NP    Onset Date/Surgical Date  01/06/19    Hand Dominance  Right    Prior Therapy  prior therapy on acute care      Precautions   Precautions  Fall      Balance Screen   Has the patient fallen in the past 6 months  Yes    How many times?  The patient describes "blacking out" when getting out of bed;   Unable to recall # of falls describing "a lot".    Has the patient had a decrease in activity level because of a fear of falling?   Yes   due to recent change in medical status   Is the patient reluctant to leave their home because of a  fear of falling?   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      Strength   Overall Strength  Deficits    Overall Strength Comments  Gross R UE strength is 3+/5, L UE strength is 5/5.  R LE is 3/5 R hip flexion, 3+/5 knee flexion/extension and 3/5 R ankle dorsiflexion.  L UE is 5/5 LEs.      Transfers   Transfers  Sit to Stand    Sit to Stand  6: Modified independent (Device/Increase time);With upper extremity assist      Ambulation/Gait   Ambulation/Gait  Yes    Ambulation/Gait Assistance  5: Supervision    Ambulation/Gait Assistance Details  The patient walks mod indep in the home, however he has some unsteadiness and lateral stepping to recover from loss of balance.  He reaches intermittently for walls for support.    Ambulation Distance (Feet)  150 Feet    Assistive device  None    Gait Pattern  Decreased stride length   slowed pace   Ambulation Surface  Level;Indoor    Gait velocity  1.67 ft/sec or 0.51 m/s     Stairs  Yes  Stairs Assistance  5: Supervision    Stair Management Technique  Alternating pattern;Two rails    Number of Stairs  4      Standardized Balance Assessment   Standardized Balance Assessment  Berg Balance Test      Berg Balance Test   Sit to Stand  Able to stand  independently using hands    Standing Unsupported  Able to stand safely 2 minutes    Sitting with Back Unsupported but Feet Supported on Floor or Stool  Able to sit safely and securely 2 minutes    Stand to Sit  Sits safely with minimal use of hands    Transfers  Able to transfer safely, definite need of hands    Standing Unsupported with Eyes Closed  Able to stand 10 seconds safely    Standing Unsupported with Feet Together  Able to place feet together independently and stand for 1 minute with supervision    From Standing, Reach Forward with Outstretched Arm  Can reach forward >5 cm safely (2")    From Standing Position, Pick up Object from Floor  Able to pick up shoe, needs supervision    From Standing Position, Turn to Look Behind Over each Shoulder  Needs supervision when turning    Turn 360 Degrees  Needs close supervision or verbal cueing    Standing Unsupported, Alternately Place Feet on Step/Stool  Able to complete >2 steps/needs minimal assist    Standing Unsupported, One Foot in Front  Able to plae foot ahead of the other independently and hold 30 seconds    Standing on One Leg  Unable to try or needs assist to prevent fall   needs help for balance   Total Score  36    Berg comment:  36/56 indicating high fall risk                Objective measurements completed on examination: See above findings.              PT Education - 02/04/19 0708    Education Details  PT plan of care    Person(s) Educated  Patient    Methods  Explanation    Comprehension  Verbalized understanding       PT Short Term Goals - 02/04/19 0710      PT SHORT TERM GOAL #1   Title  The patient will perform  HEP with written cues for balance, LE strengthening.    Baseline  no current HEP    Time  4    Period  Weeks    Target Date  03/06/19      PT SHORT TERM GOAL #2   Title  The patient will ambulate in clinic x 300 ft without using walls for external support and without loss of balance mod indep to demo improved safety with household ambulation.    Baseline  The patient uses walls intermittently and steps laterally to recover from LOB.    Time  4    Period  Weeks    Target Date  03/06/19      PT SHORT TERM GOAL #3   Title  The patient will improve Berg score from 36/56 to > or equal to 42/56 to demo dec'ing risk for falls.    Baseline  36/56    Time  4    Period  Weeks    Target Date  03/06/19      PT SHORT TERM GOAL #4   Title  The  patient will move sit>stand without UE support to demo improved LE strength for functional mobility.    Baseline  Needs UEs for support.    Time  4    Period  Weeks    Target Date  03/06/19      PT SHORT TERM GOAL #5   Title  The patient will improve gait speed to > 0.67 m/s (2.2 ft/sec) to demo dec'd risk for falls and improving mobility.    Baseline  0.51    Time  4    Period  Weeks    Target Date  03/06/19        PT Long Term Goals - 02/04/19 0715      PT LONG TERM GOAL #1   Title  The patient will be able to perform HEP for post d/c program.    Baseline  no HEP.    Time  8    Period  Weeks    Target Date  04/05/19      PT LONG TERM GOAL #2   Title  The patient will improve Berg balance score to > or equal to 46/56 to demo dec'd risk for falls.    Baseline  36/56.    Time  8    Period  Weeks    Target Date  04/05/19      PT LONG TERM GOAL #3   Title  The patient will negotiate 12 steps with one rail and reciprocal pattern mod indep for safe entrance/exit to apartment.    Baseline  Needs supervision on steps.    Time  8    Period  Weeks    Target Date  04/05/19      PT LONG TERM GOAL #4   Title  The patient will improve gait  speed from 0.51 m/s to > or equal to 0.44m/s to demo improved community mobility.    Baseline  0.51 m/s    Time  8    Period  Weeks    Target Date  04/05/19      PT LONG TERM GOAL #5   Title  The patient will move floor<>stand with UE support mod indep due to h/o falls and "blacking out" episdoes.    Baseline  due to h/o falls, will need to be able to demo safety getting up from the floor.    Time  8    Period  Weeks    Target Date  04/05/19             Plan - 02/04/19 [id number]    Clinical Impression Statement  The patient is a 43 yo male s/p craniotomy 01/06/19 due to SDH.  He presents to OP PT with h/o falls reporting "blacking out" episodes.  Per Merrilee Jansky and gait speed, patient is at a high risk for falls. PT to address deficits to promote improved safety during functional mobility.    Personal Factors and Comorbidities  Behavior Pattern;Comorbidity 1;Comorbidity 2;Comorbidity 3+    Comorbidities  h/o falls, DM-2, HTN, OSA, alcohol use disorder, depression and chronic pain    Examination-Activity Limitations  Locomotion Level;Transfers;Stairs    Examination-Participation Restrictions  Cleaning;Community Activity;Driving;Shop    Stability/Clinical Decision Making  Unstable/Unpredictable    Clinical Decision Making  High    Rehab Potential  Good    PT Frequency  --   2x/week for 4 weeks followed by 1x/week for 4 weeks   PT Treatment/Interventions  ADLs/Self Care Home Management;Neuromuscular re-education;Patient/family education;Gait training;Stair training;Functional mobility training;Therapeutic activities;Therapeutic  exercise;Balance training;Manual techniques;Passive range of motion    PT Next Visit Plan  Begin HEP (sit<>stand, standing balance, LE strengthening), safety with walking.  Further assess vision during gait activities.  Balance activities.  CHECK FOR MEDICAID STATUS (ask patient if he has heard back-- frequency established today is not what we would ask medicaid for.  Will  need to discuss distribution of visits with OT/ST. Anticipate PT would use 6 visits to allow ST and OT more visits).    Recommended Other Services  Requesting ST referral    Consulted and Agree with Plan of Care  Patient       Patient will benefit from skilled therapeutic intervention in order to improve the following deficits and impairments:  Abnormal gait, Decreased activity tolerance, Decreased strength, Decreased balance, Decreased mobility, Decreased safety awareness  Visit Diagnosis: Other abnormalities of gait and mobility  Unsteadiness on feet  Muscle weakness (generalized)  Other symptoms and signs involving the nervous system     Problem List Patient Active Problem List   Diagnosis Date Noted  . Malnutrition of moderate degree 01/09/2019  . 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 Subdural bleeding ([place]) 11/24/2018  . Abdominal pain 11/22/2018  . SDH (subdural hematoma) ([place]) 11/22/2018  . Abnormal LFTs 11/22/2018  . IDDM (insulin dependent diabetes mellitus) 09/12/2018  . Acute hepatitis 09/11/2018  . Alcohol-induced mood disorder ([place])   . DKA (diabetic ketoacidoses) ([place]) 07/28/2017  . Alcohol withdrawal ([place]) 07/28/2017  . Abnormal CT scan, colon   . Benign neoplasm of descending colon   . Lower GI bleed 07/04/2017  . Transaminitis 07/04/2017  . Pain   . Bleeding internal hemorrhoids   . Anemia of chronic disease   . Left arm weakness   . Anemia, blood loss   . Vomiting alone 03/27/2017  . Chest pain 03/27/2017  . Nausea and vomiting   . Lactic acidosis 03/26/2017  . History of alcohol abuse 06/25/2016  . Tobacco abuse 06/25/2016  . Anxiety state 11/20/2015  . Dental infection 07/10/2015  . Mood disorder ([place]) 04/18/2015  . Eczema 06/27/2014  . GERD (gastroesophageal reflux disease) 05/24/2014  . Snoring 04/07/2013  . Suicide attempt by substance overdose ([place]) 02/10/2013  . Alcohol abuse 12/31/2012  . Erectile dysfunction 12/09/2012  . Hematuria  06/11/2012  . Vision problem 02/02/2012  . Insomnia 12/20/2011  . Chronic pancreatitis ([place]) 02/05/2011  . Depression with anxiety 11/13/2010  . Hyperlipemia 06/27/2010  . Diabetes type 2, uncontrolled ([place]) 05/22/2010  . Essential hypertension 05/22/2010    Exodus Kutzer, PT 02/04/2019, 8:20 AM  [name] [address] [place], [place], [address] Phone: [phone]   Fax:  [phone]  Name: Willie [name] MRN: [id number] Date of Birth: 10-17-1975

## 2019-02-08 ENCOUNTER — Telehealth: Payer: Self-pay | Admitting: Family

## 2019-02-08 NOTE — Telephone Encounter (Signed)
SILDENAFIL not on patient current medication list. Please advise

## 2019-02-08 NOTE — Telephone Encounter (Signed)
Copied from [place] [phone]. Topic: Quick Communication - Rx Refill/Question >> Feb 08, 2019  9:30 AM Rainey Pines A wrote: Medication: SILDENAFIL (Patient stated that pharmacy still does not have medication.)  Has the patient contacted their pharmacy? {Yes (Agent: If no, request that the patient contact the pharmacy for the refill.) (Agent: If yes, when and what did the pharmacy advise?)Contact PCP  Preferred Pharmacy (with phone number or street name): [facility] [address] [place], [place], [place] [address] [phone]   Agent: Please be advised that RX refills may take up to 3 business days. We ask that you follow-up with your pharmacy.

## 2019-02-09 ENCOUNTER — Ambulatory Visit: Payer: Self-pay

## 2019-02-09 MED ORDER — SILDENAFIL CITRATE 20 MG PO TABS: ORAL_TABLET | ORAL | 1 refills | Status: DC

## 2019-02-09 NOTE — Telephone Encounter (Signed)
Pt called to check status. Pt would like a call back from the nurse. Pt states that someone told him on the 8th that we had sent in medication. Please advise

## 2019-02-09 NOTE — Telephone Encounter (Signed)
Patient advised rx was sent in for him ?

## 2019-02-09 NOTE — Telephone Encounter (Signed)
rx has been sent. Please notify pt.

## 2019-02-09 NOTE — Telephone Encounter (Signed)
Pt would like medication sent to   [facility] [address] [place], [place] - [address] [phone] (Phone) [phone] (Fax)

## 2019-02-10 ENCOUNTER — Other Ambulatory Visit: Payer: Self-pay

## 2019-02-10 ENCOUNTER — Ambulatory Visit: Payer: Self-pay | Admitting: Occupational Therapy

## 2019-02-10 DIAGNOSIS — R2689 Other abnormalities of gait and mobility: Secondary | ICD-10-CM

## 2019-02-10 DIAGNOSIS — R41840 Attention and concentration deficit: Secondary | ICD-10-CM

## 2019-02-10 DIAGNOSIS — R41844 Frontal lobe and executive function deficit: Secondary | ICD-10-CM

## 2019-02-10 DIAGNOSIS — M6281 Muscle weakness (generalized): Secondary | ICD-10-CM

## 2019-02-10 DIAGNOSIS — R29818 Other symptoms and signs involving the nervous system: Secondary | ICD-10-CM

## 2019-02-10 DIAGNOSIS — R41842 Visuospatial deficit: Secondary | ICD-10-CM

## 2019-02-10 DIAGNOSIS — R2681 Unsteadiness on feet: Secondary | ICD-10-CM

## 2019-02-10 NOTE — Patient Instructions (Signed)
11. Grip Strengthening (Resistive Putty)   Squeeze putty using thumb and all fingers. Repeat 20 times. Do 1-2 sessions per day.   Extension (Assistive Putty)   Roll putty back and forth, being sure to use all fingertips. Repeat 3 times. Do 1-2 sessions per day.  Then pinch as below.   Palmar Pinch Strengthening (Resistive Putty)   Pinch putty between thumb and each fingertip in turn after rolling out

## 2019-02-10 NOTE — Therapy (Addendum)
[facility] [address] [place], [place], [address] Phone: [phone]   Fax:  [phone]  Occupational Therapy Treatment  Patient Details  Name: Willie [name] MRN: [id number] Date of Birth: 05/17/1975 Referring Provider (OT): Dr. Macarthur Critchley Date: 02/10/2019  OT End of Session - 02/10/19 0730    Visit Number  2    Number of Visits  17    Date for OT Re-Evaluation  04/05/19    Authorization Type  self pay    OT Start Time  0723    OT Stop Time  0801    OT Time Calculation (min)  38 min    Activity Tolerance  Patient limited by fatigue    Behavior During Therapy  Flat affect       Past Medical History:  Diagnosis Date  . Alcohol abuse   . Alcoholic pancreatitis    recurrent  . Depression   . Diabetes mellitus, type II ([place])    New Onset 03/2010  . GERD (gastroesophageal reflux disease)   . High cholesterol   . 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 Hypertension   . Sleep apnea    does not wear a CPAP    Past Surgical History:  Procedure Laterality Date  . COLONOSCOPY W/ BIOPSIES  09/2010   Dr. Ardis Hughs.  for intermittent rectal bleeding.  Mild sigmoid to descending diverticulosis.  Mild left colon erythema, benign biopsy, probably "prep effect"  . COLONOSCOPY WITH PROPOFOL N/A 07/06/2017   Procedure: COLONOSCOPY WITH PROPOFOL;  Surgeon: Jerene Bears, MD;  Location: [facility];  Service: Gastroenterology;  Laterality: N/A;  . CRANIOTOMY Bilateral 11/24/2018   Procedure: Bilateral craniotomy for evacuation of subdural hematoma;  Surgeon: Erline Levine, MD;  Location: [facility];  Service: Neurosurgery;  Laterality: Bilateral;  Bilateral craniotomy for evacuation of subdural hematoma  . CRANIOTOMY Left 01/06/2019   Procedure: CRANIECTOMY;  Surgeon: Earnie Larsson, MD;  Location: [facility];  Service: Neurosurgery;  Laterality: Left;  . LUMBAR MICRODISCECTOMY  ~  2004   Dr Ellene Route    There were no vitals filed for this visit.  Subjective Assessment - 02/10/19 0728    Subjective   Pt reports that he is cooking with his girlfriend.  Reports reminders needed and difficulty chopping and carrying pots    Patient is accompanied by:  Family member   significant other   Pertinent History  This 43 y.o male s/p  frontal SDH underwent L frontal craniotomy with evacuation of subdural abcess on 01/06/2019.  PMH includes s/p bil. craniotomies for SDH 05/2018) with recurrent Lt sided SDH s/p drain placement and secondary evacuation   PMH includes:  ETOH abuse, alcoholic pancreatitis, DM, sleep apnea, h/o back pain    Patient Stated Goals  to be able speak, strength in R side    Currently in Pain?  No/denies    Pain Onset  More than a month ago          Simple environmental scanning in min distracting environment with 14/15 items found initially with remaining item found upon 2nd pass.  Copying PVC designs x2 for visual scanning and problem solving/sequencing.  Completed first with incr time and 2 questioning cues for accuracy.  Completed 2nd with good accuracy with incr time and self-corrected all errors.   Divided attention activity: ambulating with tossing ball between hands and answering questions about his weekend.  Pt  with 2 LOB with min A to recover.  Pt able to answer questions and continue with tossing ball without stopping.      OT Education - 02/10/19 0733    Education Details  Red putty HEP--see pt instructions (grip and pinch).  Recommended that pt not cook unless someone is with him for safety and due to decr balance he should not carry hot pots/pans (reports spilling hot water on him/girlfriend) and that someone should be with him in community for safety due to decr reaction time and balance    Person(s) Educated  Patient;Caregiver(s)    Methods  Explanation;Demonstration;Verbal cues;Handout    Comprehension  Verbalized understanding;Returned  demonstration;Verbal cues required       OT Short Term Goals - 02/04/19 1641      OT SHORT TERM GOAL #1   Title  I with inital HEP.    Time  4    Period  Weeks    Status  New      OT SHORT TERM GOAL #2   Title  Pt will verbalzied understanding of compensatory strategies for short term memory deficits.    Time  4    Period  Weeks      OT SHORT TERM GOAL #3   Title  Pt will perfrom basic environmental scanning tasks with 90% or better accuracy.    Time  4    Period  Weeks    Status  New      OT SHORT TERM GOAL #4   Title  Pt will perform functional activity requiring sequencing with no more than min v.c    Time  4    Period  Weeks    Status  New      OT SHORT TERM GOAL #5   Title  Pt will perfrom basic cooking task with supervision and min v.c    Time  4    Period  Weeks    Status  New        OT Long Term Goals - 02/04/19 1643      OT LONG TERM GOAL #1   Title  I with updated HEP.    Time  8    Period  Weeks    Status  New      OT LONG TERM GOAL #2   Title  Pt will perfrom mod complex environmental scanning task with 90% or better accuracy.    Time  8    Period  Weeks    Status  New      OT LONG TERM GOAL #3   Title  Pt will perform a physical  and cognitive task simultaneously with  no more than min v.c    Time  8    Period  Weeks    Status  New      OT LONG TERM GOAL #4   Title  Pt will perform basic cooking modified independently demonstrating good safety awareness.    Time  8    Period  Weeks    Status  New      OT LONG TERM GOAL #5   Title  Pt will increase RUE grip strength by 5 lbs for increased ease with functional use.    Time  8    Period  Weeks    Status  New            Plan - 02/10/19 0730    Clinical Impression Statement  Pt verbalized understanding of initial putty HEP.  Pt demo difficulty with balance with divided attention activities.    OT Occupational Profile and History  Detailed Assessment- Review of Records and  additional review of physical, cognitive, psychosocial history related to current functional performance    Occupational performance deficits (Please refer to evaluation for details):  ADL's;IADL's;Work;Leisure;Social Participation    Body Structure / Function / Physical Skills  ADL;UE functional use;FMC;ROM;Gait;GMC;Decreased knowledge of precautions;Decreased knowledge of use of DME;IADL;Dexterity;Strength;Mobility    Cognitive Skills  Attention;Memory;Problem Solve;Safety Awareness;Sequencing;Thought    Rehab Potential  Fair    Clinical Decision Making  Several treatment options, min-mod task modification necessary    Comorbidities Affecting Occupational Performance:  May have comorbidities impacting occupational performance    Modification or Assistance to Complete Evaluation   Min-Moderate modification of tasks or assist with assess necessary to complete eval    OT Frequency  2x / week    OT Duration  8 weeks   plus eval   OT Treatment/Interventions  Self-care/ADL training;Visual/perceptual remediation/compensation;Patient/family education;DME and/or AE instruction;Paraffin;Gait Training;Passive range of motion;Balance training;Fluidtherapy;Therapist, nutritional;Therapeutic activities;Manual Therapy;Therapeutic exercise;Moist Heat;Neuromuscular education;Cognitive remediation/compensation    Plan  memory compensation strategies, simple cooking task, check on ST referral and schedule    Consulted and Agree with Plan of Care  Patient       Patient will benefit from skilled therapeutic intervention in order to improve the following deficits and impairments:   Body Structure / Function / Physical Skills: ADL, UE functional use, FMC, ROM, Gait, GMC, Decreased knowledge of precautions, Decreased knowledge of use of DME, IADL, Dexterity, Strength, Mobility Cognitive Skills: Attention, Memory, Problem Solve, Safety Awareness, Sequencing, Thought     Visit Diagnosis: Attention and  concentration deficit  Muscle weakness (generalized)  Frontal lobe and executive function deficit  Other abnormalities of gait and mobility  Unsteadiness on feet  Visuospatial deficit  Other symptoms and signs involving the nervous system    Problem List Patient Active Problem List   Diagnosis Date Noted  . Malnutrition of moderate degree 01/09/2019  . 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 Subdural bleeding ([place]) 11/24/2018  . Abdominal pain 11/22/2018  . SDH (subdural hematoma) ([place]) 11/22/2018  . Abnormal LFTs 11/22/2018  . IDDM (insulin dependent diabetes mellitus) 09/12/2018  . Acute hepatitis 09/11/2018  . Alcohol-induced mood disorder ([place])   . DKA (diabetic ketoacidoses) ([place]) 07/28/2017  . Alcohol withdrawal ([place]) 07/28/2017  . Abnormal CT scan, colon   . Benign neoplasm of descending colon   . Lower GI bleed 07/04/2017  . Transaminitis 07/04/2017  . Pain   . Bleeding internal hemorrhoids   . Anemia of chronic disease   . Left arm weakness   . Anemia, blood loss   . Vomiting alone 03/27/2017  . Chest pain 03/27/2017  . Nausea and vomiting   . Lactic acidosis 03/26/2017  . History of alcohol abuse 06/25/2016  . Tobacco abuse 06/25/2016  . Anxiety state 11/20/2015  . Dental infection 07/10/2015  . Mood disorder ([place]) 04/18/2015  . Eczema 06/27/2014  . GERD (gastroesophageal reflux disease) 05/24/2014  . Snoring 04/07/2013  . Suicide attempt by substance overdose ([place]) 02/10/2013  . Alcohol abuse 12/31/2012  . Erectile dysfunction 12/09/2012  . Hematuria 06/11/2012  . Vision problem 02/02/2012  . Insomnia 12/20/2011  . Chronic pancreatitis ([place]) 02/05/2011  . Depression with anxiety 11/13/2010  . Hyperlipemia 06/27/2010  . Diabetes type 2, uncontrolled ([place]) 05/22/2010  . Essential hypertension 05/22/2010    [facility] 02/10/2019, 3:14 PM  [facility] [phone]  [place], [place],  [address] Phone: [phone]   Fax:  [phone]  Name: Willie [name] MRN: [id number] Date of Birth: Feb 05, 1976   Vianne Bulls, OTR/L [facility] [address] [place], [place]  [address] [phone] phone [phone] 02/10/19 3:14 PM

## 2019-02-11 ENCOUNTER — Ambulatory Visit: Payer: Self-pay | Admitting: Physical Therapy

## 2019-02-11 ENCOUNTER — Ambulatory Visit: Payer: Medicaid Other

## 2019-02-11 ENCOUNTER — Encounter: Payer: Self-pay | Admitting: Physical Therapy

## 2019-02-11 DIAGNOSIS — R2689 Other abnormalities of gait and mobility: Secondary | ICD-10-CM

## 2019-02-11 DIAGNOSIS — R2681 Unsteadiness on feet: Secondary | ICD-10-CM

## 2019-02-11 DIAGNOSIS — R29818 Other symptoms and signs involving the nervous system: Secondary | ICD-10-CM

## 2019-02-11 DIAGNOSIS — M6281 Muscle weakness (generalized): Secondary | ICD-10-CM

## 2019-02-11 NOTE — Patient Instructions (Signed)
    Home exercise program created by Mackenzye Mackel, PT.  For questions, please contact Resean Brander via phone at [phone] or email at [email or web address]  [facility] [place] [address]  [address] [place], [place], [address] Phone: [phone]   Fax:  [phone]    

## 2019-02-11 NOTE — Therapy (Signed)
[facility] [place] [address]  [place], [place], [address] Phone: [phone]   Fax:  [phone]  Physical Therapy Treatment  Patient Details  Name: Willie [name] MRN: [id number] Date of Birth: 1975-09-20 Referring Provider (PT): Erline Levine, MD / Primary care Debbrah Alar, NP   Encounter Date: 02/11/2019  PT End of Session - 02/11/19 1108    Visit Number  2    Number of Visits  12    Date for PT Re-Evaluation  04/05/19    Authorization Type  Medicaid pending (currently self pay per notes)    PT Start Time  1102    PT Stop Time  1142    PT Time Calculation (min)  40 min    Activity Tolerance  Patient tolerated treatment well    Behavior During Therapy  [facility] for tasks assessed/performed;Flat affect       Past Medical History:  Diagnosis Date  . Alcohol abuse   . Alcoholic pancreatitis    recurrent  . Depression   . Diabetes mellitus, type II ([place])    New Onset 03/2010  . GERD (gastroesophageal reflux disease)   . High cholesterol   . 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 Hypertension   . Sleep apnea    does not wear a CPAP    Past Surgical History:  Procedure Laterality Date  . COLONOSCOPY W/ BIOPSIES  09/2010   Dr. Ardis Hughs.  for intermittent rectal bleeding.  Mild sigmoid to descending diverticulosis.  Mild left colon erythema, benign biopsy, probably "prep effect"  . COLONOSCOPY WITH PROPOFOL N/A 07/06/2017   Procedure: COLONOSCOPY WITH PROPOFOL;  Surgeon: Jerene Bears, MD;  Location: [facility];  Service: Gastroenterology;  Laterality: N/A;  . CRANIOTOMY Bilateral 11/24/2018   Procedure: Bilateral craniotomy for evacuation of subdural hematoma;  Surgeon: Erline Levine, MD;  Location: [facility];  Service: Neurosurgery;  Laterality: Bilateral;  Bilateral craniotomy for evacuation of subdural hematoma  . CRANIOTOMY Left 01/06/2019   Procedure:  CRANIECTOMY;  Surgeon: Earnie Larsson, MD;  Location: [facility];  Service: Neurosurgery;  Laterality: Left;  . LUMBAR MICRODISCECTOMY  ~ 2004   Dr Ellene Route    There were no vitals filed for this visit.  Subjective Assessment - 02/11/19 1107    Subjective  Pt reports he doesn't "typically do too much" - just sits at home using the putty.    Pertinent History  subdural hematoma, DM-2, HTN, OSA, alcohol use disorder, alcoholic pancreatitis, depression and chronic pain    Patient Stated Goals  Being able to recall items and speech therapy.    Currently in Pain?  No/denies                       [facility] Adult PT Treatment/Exercise - 02/11/19 1102      Exercises   Exercises  Knee/Hip      Knee/Hip Exercises: Aerobic   Nustep  L4 x 6 min (UE/LE)      Knee/Hip Exercises: Standing   Heel Raises  Both;10 reps;5 seconds    Heel Raises Limitations  UE support on counter    Functional Squat  10 reps;5 seconds    Functional Squat Limitations  counter squat with chair behind for safety - cues to push hips back as if going to sit in chair, avoiding knees fwd of toes      Knee/Hip Exercises:  Seated   Long Arc Quad  Right;10 reps;Strengthening    Long Arc Quad Limitations  looped red TB at ankles    [facility]  10 x 5"    Clamshell with TheraBand  Red   10 x 3" - alt hip ABD/ER    Marching  Both;10 reps;Strengthening    Marching Limitations  looped red TB at knees    Hamstring Curl  Right;10 reps;Strengthening    Hamstring Limitations  looped red TB at ankles; L foot propped on stool    Sit to Sand  10 reps;with UE support   single UE assist            PT Education - 02/11/19 1142    Education Details  Initial PT HEP - LE strengthening    Person(s) Educated  Patient    Methods  Explanation;Demonstration;Handout;Verbal cues    Comprehension  Verbalized understanding;Returned demonstration;Verbal cues required;Need further instruction       PT Short Term Goals - 02/11/19  1109      PT SHORT TERM GOAL #1   Title  The patient will perform HEP with written cues for balance, LE strengthening.    Baseline  initial LE strengthening HEP provided 02/11/19    Time  4    Period  Weeks    Status  On-going    Target Date  03/06/19      PT SHORT TERM GOAL #2   Title  The patient will ambulate in clinic x 300 ft without using walls for external support and without loss of balance mod indep to demo improved safety with household ambulation.    Baseline  The patient uses walls intermittently and steps laterally to recover from LOB.    Time  4    Period  Weeks    Status  On-going    Target Date  03/06/19      PT SHORT TERM GOAL #3   Title  The patient will improve Berg score from 36/56 to > or equal to 42/56 to demo dec'ing risk for falls.    Baseline  36/56    Time  4    Period  Weeks    Status  On-going    Target Date  03/06/19      PT SHORT TERM GOAL #4   Title  The patient will move sit>stand without UE support to demo improved LE strength for functional mobility.    Baseline  Needs UEs for support.    Time  4    Period  Weeks    Status  On-going    Target Date  03/06/19      PT SHORT TERM GOAL #5   Title  The patient will improve gait speed to > 0.67 m/s (2.2 ft/sec) to demo dec'd risk for falls and improving mobility.    Baseline  0.51    Time  4    Period  Weeks    Status  On-going    Target Date  03/06/19        PT Long Term Goals - 02/11/19 1110      PT LONG TERM GOAL #1   Title  The patient will be able to perform HEP for post d/c program.    Baseline  no HEP.    Time  8    Period  Weeks    Status  On-going    Target Date  04/05/19      PT LONG TERM GOAL #2  Title  The patient will improve Berg balance score to > or equal to 46/56 to demo dec'd risk for falls.    Baseline  36/56.    Time  8    Period  Weeks    Status  On-going    Target Date  04/05/19      PT LONG TERM GOAL #3   Title  The patient will negotiate 12 steps with  one rail and reciprocal pattern mod indep for safe entrance/exit to apartment.    Baseline  Needs supervision on steps.    Time  8    Period  Weeks    Status  On-going    Target Date  04/05/19      PT LONG TERM GOAL #4   Title  The patient will improve gait speed from 0.51 m/s to > or equal to 0.53m/s to demo improved community mobility.    Baseline  0.51 m/s    Time  8    Period  Weeks    Status  On-going    Target Date  04/05/19      PT LONG TERM GOAL #5   Title  The patient will move floor<>stand with UE support mod indep due to h/o falls and "blacking out" episdoes.    Baseline  due to h/o falls, will need to be able to demo safety getting up from the floor.    Time  8    Period  Weeks    Status  On-going    Target Date  04/05/19            Plan - 02/11/19 1110    Clinical Impression Statement  Provided instruction in initial HEP targeting LE strengthening in standing and sitting with red TB resistance. Patient able to provide good return demonstration of all exercises and verbalizes good understanding with no concerns regarding ability to replicate exercises at home, therefore provide written instructions for HEP. Will plan to intermittently monitor progress with HEP, updating accordingly and adding balance activities as appropriate.    Comorbidities  h/o falls, DM-2, HTN, OSA, alcohol use disorder, depression and chronic pain    Rehab Potential  Good    PT Frequency  --   2x/week for 4 weeks followed by 1x/week for 4 weeks   PT Duration  8 weeks    PT Treatment/Interventions  ADLs/Self Care Home Management;Neuromuscular re-education;Patient/family education;Gait training;Stair training;Functional mobility training;Therapeutic activities;Therapeutic exercise;Balance training;Manual techniques;Passive range of motion    PT Next Visit Plan  Review & update LE strengthening HEP as indicated, adding balance activities as appropriate; Balance training & safety with walking.   Further assess vision during gait activities.  Balance activities.  CHECK FOR MEDICAID STATUS (Ask patient if he has heard back - current frequency is not what we would ask Medicaid for.  Will need to discuss distribution of visits with OT/ST. Anticipate PT would use 6 visits to allow ST and OT more visits).    PT Home Exercise Plan  02/11/19 - sit <> stand (1 UE assist), heel raises, counter squat (chair for safety), seated hip ADD pillow squeeze, marching, clams, LAQ & HS curls with red TB    Consulted and Agree with Plan of Care  Patient       Patient will benefit from skilled therapeutic intervention in order to improve the following deficits and impairments:  Abnormal gait, Decreased activity tolerance, Decreased strength, Decreased balance, Decreased mobility, Decreased safety awareness  Visit Diagnosis: Other abnormalities of gait and  mobility  Unsteadiness on feet  Muscle weakness (generalized)  Other symptoms and signs involving the nervous system     Problem List Patient Active Problem List   Diagnosis Date Noted  . Malnutrition of moderate degree 01/09/2019  . 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 Subdural bleeding ([place]) 11/24/2018  . Abdominal pain 11/22/2018  . SDH (subdural hematoma) ([place]) 11/22/2018  . Abnormal LFTs 11/22/2018  . IDDM (insulin dependent diabetes mellitus) 09/12/2018  . Acute hepatitis 09/11/2018  . Alcohol-induced mood disorder ([place])   . DKA (diabetic ketoacidoses) ([place]) 07/28/2017  . Alcohol withdrawal ([place]) 07/28/2017  . Abnormal CT scan, colon   . Benign neoplasm of descending colon   . Lower GI bleed 07/04/2017  . Transaminitis 07/04/2017  . Pain   . Bleeding internal hemorrhoids   . Anemia of chronic disease   . Left arm weakness   . Anemia, blood loss   . Vomiting alone 03/27/2017  . Chest pain 03/27/2017  . Nausea and vomiting   . Lactic acidosis 03/26/2017  . History of alcohol abuse 06/25/2016  . Tobacco abuse 06/25/2016  . Anxiety  state 11/20/2015  . Dental infection 07/10/2015  . Mood disorder ([place]) 04/18/2015  . Eczema 06/27/2014  . GERD (gastroesophageal reflux disease) 05/24/2014  . Snoring 04/07/2013  . Suicide attempt by substance overdose ([place]) 02/10/2013  . Alcohol abuse 12/31/2012  . Erectile dysfunction 12/09/2012  . Hematuria 06/11/2012  . Vision problem 02/02/2012  . Insomnia 12/20/2011  . Chronic pancreatitis ([place]) 02/05/2011  . Depression with anxiety 11/13/2010  . Hyperlipemia 06/27/2010  . Diabetes type 2, uncontrolled ([place]) 05/22/2010  . Essential hypertension 05/22/2010    Percival Spanish, PT, MPT 02/11/2019, 12:01 PM  [facility] [address]  [place], [place], [address] Phone: [phone]   Fax:  [phone]  Name: Willie [name] MRN: [id number] Date of Birth: Sep 14, 1975

## 2019-02-15 ENCOUNTER — Encounter: Payer: Self-pay | Admitting: Physical Therapy

## 2019-02-15 ENCOUNTER — Ambulatory Visit (HOSPITAL_BASED_OUTPATIENT_CLINIC_OR_DEPARTMENT_OTHER)
Admission: RE | Admit: 2019-02-15 | Discharge: 2019-02-15 | Disposition: A | Discharge status: Home / Self Care | Payer: Self-pay | Source: Ambulatory Visit | Attending: Family | Admitting: Family

## 2019-02-15 ENCOUNTER — Ambulatory Visit: Payer: Self-pay | Admitting: Physical Therapy

## 2019-02-15 ENCOUNTER — Telehealth: Payer: Self-pay | Admitting: Family

## 2019-02-15 ENCOUNTER — Other Ambulatory Visit: Payer: Self-pay

## 2019-02-15 ENCOUNTER — Ambulatory Visit (INDEPENDENT_AMBULATORY_CARE_PROVIDER_SITE_OTHER): Payer: Medicaid Other | Admitting: Family

## 2019-02-15 VITALS — BP 150/96 | HR 107

## 2019-02-15 DIAGNOSIS — R29818 Other symptoms and signs involving the nervous system: Secondary | ICD-10-CM

## 2019-02-15 DIAGNOSIS — I10 Essential (primary) hypertension: Secondary | ICD-10-CM

## 2019-02-15 DIAGNOSIS — G44319 Acute post-traumatic headache, not intractable: Secondary | ICD-10-CM | POA: Insufficient documentation

## 2019-02-15 DIAGNOSIS — R2681 Unsteadiness on feet: Secondary | ICD-10-CM

## 2019-02-15 DIAGNOSIS — R2689 Other abnormalities of gait and mobility: Secondary | ICD-10-CM

## 2019-02-15 DIAGNOSIS — M6281 Muscle weakness (generalized): Secondary | ICD-10-CM

## 2019-02-15 DIAGNOSIS — E119 Type 2 diabetes mellitus without complications: Secondary | ICD-10-CM

## 2019-02-15 MED ORDER — AMLODIPINE BESYLATE 5 MG PO TABS: 5.0000 mg | ORAL_TABLET | Freq: Every day | ORAL | 3 refills | Status: DC

## 2019-02-15 MED ORDER — HYDROCODONE-ACETAMINOPHEN 5-300 MG PO TABS: 1.0000 | ORAL_TABLET | Freq: Four times a day (QID) | ORAL | 0 refills | Status: DC | PRN

## 2019-02-15 NOTE — Therapy (Addendum)
[facility] [address]  [place], [place], [address] Phone: [phone]   Fax:  [phone]  Physical Therapy Treatment / Discharge Summary  Patient Details  Name: Willie [name] MRN: [id number] Date of Birth: 20-Dec-1975 Referring Provider (PT): Erline Levine, MD / Primary care Debbrah Alar, NP   Encounter Date: 02/15/2019  PT End of Session - 02/15/19 1019    Visit Number  3    Number of Visits  12    Date for PT Re-Evaluation  04/05/19    Authorization Type  Medicaid pending (currently self pay per notes)    PT Start Time  1019    PT Stop Time  1048    PT Time Calculation (min)  29 min    Activity Tolerance  Patient tolerated treatment well    Behavior During Therapy  [facility] for tasks assessed/performed;Flat affect       Past Medical History:  Diagnosis Date  . Alcohol abuse   . Alcoholic pancreatitis    recurrent  . Depression   . Diabetes mellitus, type II ([place])    New Onset 03/2010  . GERD (gastroesophageal reflux disease)   . High cholesterol   . 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 Hypertension   . Sleep apnea    does not wear a CPAP    Past Surgical History:  Procedure Laterality Date  . COLONOSCOPY W/ BIOPSIES  09/2010   Dr. Ardis Hughs.  for intermittent rectal bleeding.  Mild sigmoid to descending diverticulosis.  Mild left colon erythema, benign biopsy, probably "prep effect"  . COLONOSCOPY WITH PROPOFOL N/A 07/06/2017   Procedure: COLONOSCOPY WITH PROPOFOL;  Surgeon: Jerene Bears, MD;  Location: [facility];  Service: Gastroenterology;  Laterality: N/A;  . CRANIOTOMY Bilateral 11/24/2018   Procedure: Bilateral craniotomy for evacuation of subdural hematoma;  Surgeon: Erline Levine, MD;  Location: [facility];  Service: Neurosurgery;  Laterality: Bilateral;  Bilateral craniotomy for evacuation of subdural hematoma  . CRANIOTOMY Left 01/06/2019    Procedure: CRANIECTOMY;  Surgeon: Earnie Larsson, MD;  Location: [facility];  Service: Neurosurgery;  Laterality: Left;  . LUMBAR MICRODISCECTOMY  ~ 2004   Dr Ellene Route    Vitals:   02/15/19 1034 02/15/19 1037 02/15/19 1050  SpO2: 91% 95% 94%    Subjective Assessment - 02/15/19 1022    Subjective  Pt reporting he has been able to do the LE HEP every morning and reports no concerns. Pt reporting he fell 2 days ago - got up too fast from bed and blacked out, hitting his head on the nightstand (R frontal). Still has a headache from this but denies other neuro symptoms. Has not contacted MD or been seen in ED.    Pertinent History  subdural hematoma, DM-2, HTN, OSA, alcohol use disorder, alcoholic pancreatitis, depression and chronic pain    Patient Stated Goals  Being able to recall items and speech therapy.    Currently in Pain?  Yes    Pain Score  4     Pain Location  Head    Pain Descriptors / Indicators  Headache    Pain Type  Acute pain    Pain Onset  In the past 7 days                       [facility] Adult PT Treatment/Exercise - 02/15/19 1019  Knee/Hip Exercises: Aerobic   Nustep  L5 x 6 min (UE/LE)               PT Short Term Goals - 02/11/19 1109      PT SHORT TERM GOAL #1   Title  The patient will perform HEP with written cues for balance, LE strengthening.    Baseline  initial LE strengthening HEP provided 02/11/19    Time  4    Period  Weeks    Status  On-going    Target Date  03/06/19      PT SHORT TERM GOAL #2   Title  The patient will ambulate in clinic x 300 ft without using walls for external support and without loss of balance mod indep to demo improved safety with household ambulation.    Baseline  The patient uses walls intermittently and steps laterally to recover from LOB.    Time  4    Period  Weeks    Status  On-going    Target Date  03/06/19      PT SHORT TERM GOAL #3   Title  The patient will improve Berg score from 36/56 to > or  equal to 42/56 to demo dec'ing risk for falls.    Baseline  36/56    Time  4    Period  Weeks    Status  On-going    Target Date  03/06/19      PT SHORT TERM GOAL #4   Title  The patient will move sit>stand without UE support to demo improved LE strength for functional mobility.    Baseline  Needs UEs for support.    Time  4    Period  Weeks    Status  On-going    Target Date  03/06/19      PT SHORT TERM GOAL #5   Title  The patient will improve gait speed to > 0.67 m/s (2.2 ft/sec) to demo dec'd risk for falls and improving mobility.    Baseline  0.51    Time  4    Period  Weeks    Status  On-going    Target Date  03/06/19        PT Long Term Goals - 02/11/19 1110      PT LONG TERM GOAL #1   Title  The patient will be able to perform HEP for post d/c program.    Baseline  no HEP.    Time  8    Period  Weeks    Status  On-going    Target Date  04/05/19      PT LONG TERM GOAL #2   Title  The patient will improve Berg balance score to > or equal to 46/56 to demo dec'd risk for falls.    Baseline  36/56.    Time  8    Period  Weeks    Status  On-going    Target Date  04/05/19      PT LONG TERM GOAL #3   Title  The patient will negotiate 12 steps with one rail and reciprocal pattern mod indep for safe entrance/exit to apartment.    Baseline  Needs supervision on steps.    Time  8    Period  Weeks    Status  On-going    Target Date  04/05/19      PT LONG TERM GOAL #4   Title  The patient will improve gait speed from  0.51 m/s to > or equal to 0.78ms to demo improved community mobility.    Baseline  0.51 m/s    Time  8    Period  Weeks    Status  On-going    Target Date  04/05/19      PT LONG TERM GOAL #5   Title  The patient will move floor<>stand with UE support mod indep due to h/o falls and "blacking out" episdoes.    Baseline  due to h/o falls, will need to be able to demo safety getting up from the floor.    Time  8    Period  Weeks    Status  On-going     Target Date  04/05/19            Plan - 02/15/19 1048    Clinical Impression Statement  During warm-up, patient reporting a fall 2 days ago secondary to "blacking out" while getting up out of bed. Patient hit his head (R frontal) on the nightstand and continues to report headache today but denies any other new neuro changes or deficits. When attempting to assess patient for orthostatic hypotension, patient also noted to have increased HR >100 which increased with orthostatic positional changes along with orthostatic BP drop, although no orthostatic symptoms reported. Educated patient on signs/symptoms of orthostatic hypotension as well as need for pause with positional changes to ensure no symptoms before continuing with mobility. Given ongoing headache since fall, increased HR and orthostatic changes, deferred further PT today and encouraged patient f/o with Dr. SVertell Limberand/or MDebbrah Alar NP (PCP).    Comorbidities  h/o falls, DM-2, HTN, OSA, alcohol use disorder, depression and chronic pain    Rehab Potential  Good    PT Frequency  --   2x/week for 4 weeks followed by 1x/week for 4 weeks   PT Duration  8 weeks    PT Treatment/Interventions  ADLs/Self Care Home Management;Neuromuscular re-education;Patient/family education;Gait training;Stair training;Functional mobility training;Therapeutic activities;Therapeutic exercise;Balance training;Manual techniques;Passive range of motion    PT Next Visit Plan  Assess VS prior to PT session & PRN during exercises; Review & update LE strengthening HEP as indicated, adding balance activities as appropriate; Balance training & safety with walking.  Further assess vision during gait activities.  Balance activities.  CHECK FOR MEDICAID STATUS (Ask patient if he has heard back - current frequency is not what we would ask Medicaid for.  Will need to discuss distribution of visits with OT/ST. Anticipate PT would use 6 visits to allow ST and OT more  visits).    PT Home Exercise Plan  02/11/19 - sit <> stand (1 UE assist), heel raises, counter squat (chair for safety), seated hip ADD pillow squeeze, marching, clams, LAQ & HS curls with red TB    Consulted and Agree with Plan of Care  Patient       Patient will benefit from skilled therapeutic intervention in order to improve the following deficits and impairments:  Abnormal gait, Decreased activity tolerance, Decreased strength, Decreased balance, Decreased mobility, Decreased safety awareness  Visit Diagnosis: Other abnormalities of gait and mobility  Unsteadiness on feet  Muscle weakness (generalized)  Other symptoms and signs involving the nervous system     Problem List Patient Active Problem List   Diagnosis Date Noted  . Malnutrition of moderate degree 01/09/2019  . 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 Subdural bleeding ([place] 11/24/2018  . Abdominal pain 11/22/2018  . SDH (subdural hematoma) ([place] 11/22/2018  . Abnormal LFTs  11/22/2018  . IDDM (insulin dependent diabetes mellitus) 09/12/2018  . Acute hepatitis 09/11/2018  . Alcohol-induced mood disorder ([place])   . DKA (diabetic ketoacidoses) ([place]) 07/28/2017  . Alcohol withdrawal ([place]) 07/28/2017  . Abnormal CT scan, colon   . Benign neoplasm of descending colon   . Lower GI bleed 07/04/2017  . Transaminitis 07/04/2017  . Pain   . Bleeding internal hemorrhoids   . Anemia of chronic disease   . Left arm weakness   . Anemia, blood loss   . Vomiting alone 03/27/2017  . Chest pain 03/27/2017  . Nausea and vomiting   . Lactic acidosis 03/26/2017  . History of alcohol abuse 06/25/2016  . Tobacco abuse 06/25/2016  . Anxiety state 11/20/2015  . Dental infection 07/10/2015  . Mood disorder ([place]) 04/18/2015  . Eczema 06/27/2014  . GERD (gastroesophageal reflux disease) 05/24/2014  . Snoring 04/07/2013  . Suicide attempt by substance overdose ([place]) 02/10/2013  . Alcohol abuse 12/31/2012  . Erectile dysfunction  12/09/2012  . Hematuria 06/11/2012  . Vision problem 02/02/2012  . Insomnia 12/20/2011  . Chronic pancreatitis ([place]) 02/05/2011  . Depression with anxiety 11/13/2010  . Hyperlipemia 06/27/2010  . Diabetes type 2, uncontrolled ([place]) 05/22/2010  . Essential hypertension 05/22/2010    Percival Spanish, PT, MPT 02/15/2019, 11:00 AM  [facility] [address]  [place], [place], [address] Phone: [phone]   Fax:  [phone]  Name: Willie [name] MRN: [id number] Date of Birth: 1976/02/09   PHYSICAL THERAPY DISCHARGE SUMMARY  Visits from Start of Care: 3  Current functional level related to goals / functional outcomes:   Refer to above clinical impression for status as of last visit on 02/15/2019. Patient cancelled next scheduled appointment due to thyroid issues and has not returned to PT in >30 days, therefore will proceed with discharge from PT for this episode.   Remaining deficits:   As above. Unable to assess status at discharge due to failure to return to PT.   Education / Equipment:   Initial HEP  Plan: Patient agrees to discharge.  Patient goals were not met. Patient is being discharged due to not returning since the last visit.  ?????     Percival Spanish, PT, MPT 04/08/19, 2:49 PM  [facility] [address]  [place], [place], [address] Phone: [phone]   Fax:  [phone]

## 2019-02-15 NOTE — Telephone Encounter (Signed)
check blood pressure prior to taking amlodipine.  Do not take BP med if BP is <150/100.  Please schedule a 1 month follow up visit with me.

## 2019-02-15 NOTE — Progress Notes (Signed)
Virtual Visit via Video Note  I connected with Willie [name] on 02/15/19 at 12:20 PM EDT by a video enabled telemedicine application and verified that I am speaking with the correct person using two identifiers.  Location: Patient: home Provider: home   I discussed the limitations of evaluation and management by telemedicine and the availability of in person appointments. The patient expressed understanding and agreed to proceed.  History of Present Illness:  Patient is a 43 yr old male who presents today for follow up after fall on 10/18.  He reports that he got up from bed and suddenly fell.  "I got up too fast and I blacked out."  States that he hit his head on a corner table.  Had some swelling. Notes worsening headache since the fall. He reports that he has had no nausea/vomitting or changes in his vision.  Denies any UE/LE weakness. States that his PT came today and checked his blood pressure and heart rate and that it was too low to do PT.  He denies any recent alcohol uses, states "I am scared to" drink alcohol.  Orthostatic BP: 112/74 100/70 92/76   Patient Position: Supine Sitting Standing   BP Location: Left Arm Left Arm Left Arm   Cuff Size: Normal - -   Orthostatic Pulse: 105 115 124    He reports bp now of 150/96.  He is not taking any blood pressure medication currently.    BP Readings from Last 3 Encounters:  02/15/19 (!) 150/96  12/27/18 124/81  11/24/17 130/75   Prior to this fall he had an admission 11/22/18-11/28/18 following Bilateral SDH on 7/18 while in [place].  He underwent bilateral craniotomy with SD evacuation on 11/24/18. He was readmitted on 01/05/19-01/13/19 due to recurrent SDH/ascess.  He underwent reexploration of left frontal craniotomy with evacuation of subdural abscess and placement of subdural drain. He is followed by Dr. Vertell Limber for neurosurgery.  He reports that out pt PT/OT/ST have been ordered.  He has residual expressive aphasia which he states is at  his baseline. States that he no longer has insurance, but that the hospital has applied for emergency medicaid for him.   DM2- reports home sugars have been stable. He is maintained on lantus and sliding scale humalog.  Lab Results  Component Value Date   HGBA1C 6.9 (H) 11/23/2018   HGBA1C 11.9 (H) 04/27/2018   HGBA1C 8.4 (H) 06/08/2017   Lab Results  Component Value Date   MICROALBUR CANCELED 11/12/2016   [place] [address] 06/08/2017   CREATININE 0.47 (L) 01/13/2019     Past Medical History:  Diagnosis Date  . Alcohol abuse   . Alcoholic pancreatitis    recurrent  . Depression   . Diabetes mellitus, type II ([place])    New Onset 03/2010  . GERD (gastroesophageal reflux disease)   . High cholesterol   . 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 Hypertension   . Sleep apnea    does not wear a CPAP     Social History   Socioeconomic History  . Marital status: Married    Spouse name: Not on file  . Number of children: 2  . Years of education: Not on file  . Highest education level: Not on file  Occupational History  . Occupation: Landscape architect: [employer]  . Financial resource strain: Not on file  .  Food insecurity    Worry: Not on file    Inability: Not on file  . Transportation needs    Medical: Not on file    Non-medical: Not on file  Tobacco Use  . Smoking status: Former Smoker    Packs/day: 0.25    Years: 1.00    Pack years: 0.25    Types: Cigarettes    Start date: 04/29/2016  . Smokeless tobacco: Never Used  . Tobacco comment: 4 Cigarettes a day   Substance and Sexual Activity  . Alcohol use: Yes    Comment: 12 pack beer a day  . Drug use: Not Currently    Comment: none in years  . Sexual activity: Not on file  Lifestyle  . Physical activity    Days per week: Not on file    Minutes per session: Not on file  . Stress: Not on file  Relationships  . Social  Herbalist on phone: Not on file    Gets together: Not on file    Attends religious service: Not on file    Active member of club or organization: Not on file    Attends meetings of clubs or organizations: Not on file    Relationship status: Not on file  . Intimate partner violence    Fear of current or ex partner: Not on file    Emotionally abused: Not on file    Physically abused: Not on file    Forced sexual activity: Not on file  Other Topics Concern  . 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 COLONOSCOPY W/ BIOPSIES  09/2010   Dr. Ardis Hughs.  for intermittent rectal bleeding.  Mild sigmoid to descending diverticulosis.  Mild left colon erythema, benign biopsy, probably "prep effect"  . COLONOSCOPY WITH PROPOFOL N/A 07/06/2017   Procedure: COLONOSCOPY WITH PROPOFOL;  Surgeon: Jerene Bears, MD;  [facility]: [facility];  Service: Gastroenterology;  Laterality: N/A;  . CRANIOTOMY Bilateral 11/24/2018   Procedure: Bilateral craniotomy for evacuation of subdural hematoma;  Surgeon: Erline Levine, MD;  Location: [facility];  Service: Neurosurgery;  Laterality: Bilateral;  Bilateral craniotomy for evacuation of subdural hematoma  . CRANIOTOMY Left 01/06/2019   Procedure: CRANIECTOMY;  Surgeon: Earnie Larsson, MD;  Location: [facility];  Service: Neurosurgery;  Laterality: Left;  . LUMBAR MICRODISCECTOMY  ~ 2004   Dr Ellene Route    Family History  Problem Relation Age of Onset  . Diabetes Mother   . Lung cancer Brother        twin brother  . Pancreatic cancer Paternal Aunt   . Colon cancer Neg Hx   . Stomach cancer Neg Hx     Allergies  Allergen Reactions  . Invokamet [Canagliflozin-Metformin Hcl] Other (See Comments)    Lactic acidosis  . Metformin And  Related Other (See Comments)    DRASTIC drop in blood sugar    Current Outpatient Medications on File Prior to Visit  Medication Sig Dispense Refill  . blood glucose meter kit and supplies KIT Dispense based on patient and insurance preference. Use up to four times daily as directed. (FOR ICD-9 250.00, 250.01). 1 each  0  . Continuous Blood Gluc Sensor (FREESTYLE LIBRE 14 DAY SENSOR) MISC Inject 1 each into the skin every 14 (fourteen) days. 6 each 3  . escitalopram (LEXAPRO) 20 MG tablet Take 1 tablet (20 mg total) by mouth daily. 90 tablet 0  . folic acid (FOLVITE) 1 MG tablet Take 1 tablet (1 mg total) by mouth daily. 90 tablet 0  . glucose blood (FREESTYLE LITE) test strip Use as instructed 100 each 12  . ibuprofen (ADVIL) 400 MG tablet Take 400 mg by mouth.    . Insulin Glargine (LANTUS SOLOSTAR) 100 UNIT/ML Solostar Pen Inject 20-25 Units into the skin at bedtime. (Patient taking differently: Inject 25 Units into the skin at bedtime. ) 6 mL 2  . insulin lispro (HUMALOG KWIKPEN) 100 UNIT/ML KwikPen Inject subcutaneously 3 times a day (just before each meal) 09-10-08 units Humalog 5 units with small meal, 10 units with medium sized meal, and 15 units with large meal. (Patient taking differently: Inject 5-10 Units into the skin See admin instructions. Inject 5-10 units subcutaneously with each meal - based on CBG) 10 mL 2  . Insulin Pen Needle (ULTICARE SHORT PEN NEEDLES) 31G X 8 MM MISC Use once daily to inject insulin. 100 each 1  . Lancets (FREESTYLE) lancets 2 (two) times daily 200 each 5  . LORazepam (ATIVAN) 1 MG tablet Take 1 tablet (1 mg total) by mouth every 8 (eight) hours as needed for anxiety. 30 tablet 0  . Multiple Vitamins-Minerals (MULTIVITAMIN) tablet Take 1 tablet by mouth daily. 30 tablet 0  . ondansetron (ZOFRAN ODT) 4 MG disintegrating tablet '4mg'$  ODT q4 hours prn nausea/vomit (Patient taking differently: Take 4 mg by mouth every 4 (four) hours as needed for nausea or vomiting.  ) 4 tablet 0  . pantoprazole (PROTONIX) 40 MG tablet Take 1 tablet (40 mg total) by mouth 2 (two) times daily. 60 tablet 5  . QUEtiapine (SEROQUEL) 100 MG tablet Take 1 tablet (100 mg total) by mouth 2 (two) times daily. 60 tablet 1  . QUEtiapine (SEROQUEL) 300 MG tablet Take 1 tablet (300 mg total) by mouth at bedtime. 90 tablet 0  . sildenafil (REVATIO) 20 MG tablet 1-2 tablets by mouth prior to sexual activity. 50 tablet 1  . traMADol (ULTRAM) 50 MG tablet Take 1-2 tablets (50-100 mg total) by mouth every 6 (six) hours as needed. 30 tablet 0  . levETIRAcetam (KEPPRA) 500 MG tablet Take 1 tablet (500 mg total) by mouth 2 (two) times daily. 60 tablet 0   No current facility-administered medications on file prior to visit.     BP (!) 150/96   Pulse (!) 107       Observations/Objective:   Gen: Awake, alert, no acute distress Head: ecchymosis noted right parietal region Resp: Breathing is even and non-labored Psych: calm/pleasant demeanor Neuro: Alert and Oriented x 3, + facial symmetry, speech is clear but pt has some expressive aphasia   Assessment and Plan:  Head Trauma- advised pt that I would like for him to complete a CT head today to rule out any progressive bleed.  He is agreeable to proceed to imaging now and the imaging department with work him in.  He is complaining about HA 4/10 despite tylenol and tramadol.  Reviewed [place] controlled substance registry. I advised him that I would provide a 3 day prn course of hydrocodone but that this should not be mixed with lorazepam.  Clonazepam is on his med list but he states he  is not taking- I advised him to remain off of clonazepam. I also recommended that he only use lorazepam on a prn basis. In addition he is not using ambien.   HTN- amlodipine was discontinued during his last hospitalization. He wonders if he should restart amlodipine.  Previous dose was 79m daily.  His BP readings were low this AM but higher this afternoon.  I will  give him the following parameters for his BP medication- check blood pressure prior to taking amlodipine.  Do not take BP med if BP is <150/100.    DM2- clinically stable on current regimen. Continue same.   Follow Up Instructions:    I discussed the assessment and treatment plan with the patient. The patient was provided an opportunity to ask questions and all were answered. The patient agreed with the plan and demonstrated an understanding of the instructions.   The patient was advised to call back or seek an in-person evaluation if the symptoms worsen or if the condition fails to improve as anticipated.  MNance Pear NP

## 2019-02-16 ENCOUNTER — Encounter: Payer: Self-pay | Admitting: Family

## 2019-02-16 ENCOUNTER — Ambulatory Visit: Payer: Medicaid Other

## 2019-02-16 ENCOUNTER — Ambulatory Visit: Payer: Self-pay | Admitting: Occupational Therapy

## 2019-02-17 ENCOUNTER — Ambulatory Visit: Payer: Self-pay | Admitting: Occupational Therapy

## 2019-02-17 ENCOUNTER — Ambulatory Visit: Payer: Self-pay

## 2019-02-17 ENCOUNTER — Encounter: Payer: Self-pay | Admitting: Family

## 2019-02-17 NOTE — Telephone Encounter (Signed)
Pt was advised as below.

## 2019-02-18 ENCOUNTER — Other Ambulatory Visit: Payer: Self-pay | Admitting: Family

## 2019-02-18 ENCOUNTER — Encounter: Payer: Self-pay | Admitting: Family

## 2019-02-18 NOTE — Telephone Encounter (Signed)
Medication Refill - Medication: LORazepam (ATIVAN) 1 MG tablet  Preferred Pharmacy:  [facility] [place] [place] [address] [phone] (Phone) [phone] (Fax)     Pt was advised that RX refills may take up to 3 business days. We ask that you follow-up with your pharmacy.

## 2019-02-18 NOTE — Telephone Encounter (Signed)
Requested medication (s) are due for refill today:yes  Requested medication (s) are on the active medication list: yes  Last refill:  01/01/2019  Future visit scheduled: no  Notes to clinic:  Refill cannot be delegated   Requested Prescriptions  Pending Prescriptions Disp Refills   LORazepam (ATIVAN) 1 MG tablet 30 tablet 0    Sig: Take 1 tablet (1 mg total) by mouth every 8 (eight) hours as needed for anxiety.     Not Delegated - Psychiatry:  Anxiolytics/Hypnotics Failed - 02/18/2019  9:20 AM      Failed - This refill cannot be delegated      Passed - Urine Drug Screen completed in last 360 days.      Passed - Valid encounter within last 6 months    Recent Outpatient Visits          3 days ago Acute post-traumatic headache, not intractable   [facility] [place], NP   2 months ago SDH (subdural hematoma) [facility])   [facility] [place], NP   4 months ago No-show for appointment   [facility] [place], NP   6 months ago Unilateral inguinal hernia without obstruction or gangrene, recurrence not specified   [facility] at [place], NP   9 months ago Uncontrolled type 2 diabetes mellitus with hyperglycemia ([place])   [facility] at [place], NP      Future Appointments            In 5 days Dorena Dew, [place]

## 2019-02-19 ENCOUNTER — Ambulatory Visit: Payer: Medicaid Other

## 2019-02-19 MED ORDER — LORAZEPAM 1 MG PO TABS: 1.0000 mg | ORAL_TABLET | Freq: Three times a day (TID) | ORAL | 0 refills | Status: DC | PRN

## 2019-02-20 ENCOUNTER — Encounter: Payer: Self-pay | Admitting: Family

## 2019-02-22 ENCOUNTER — Ambulatory Visit: Payer: Self-pay

## 2019-02-23 ENCOUNTER — Inpatient Hospital Stay: Payer: Medicaid Other | Admitting: Family Medicine

## 2019-02-23 ENCOUNTER — Other Ambulatory Visit: Payer: Self-pay

## 2019-02-23 ENCOUNTER — Ambulatory Visit: Payer: Medicaid Other

## 2019-02-23 ENCOUNTER — Ambulatory Visit: Payer: Self-pay | Admitting: Occupational Therapy

## 2019-02-25 ENCOUNTER — Ambulatory Visit: Payer: Medicaid Other

## 2019-03-01 ENCOUNTER — Ambulatory Visit: Payer: Self-pay | Attending: Neurosurgery | Admitting: Occupational Therapy

## 2019-03-01 ENCOUNTER — Ambulatory Visit: Payer: Medicaid Other

## 2019-03-05 ENCOUNTER — Ambulatory Visit: Payer: Self-pay | Admitting: Occupational Therapy

## 2019-03-05 ENCOUNTER — Ambulatory Visit: Payer: Medicaid Other

## 2019-03-10 ENCOUNTER — Other Ambulatory Visit: Payer: Self-pay | Admitting: Family

## 2019-03-19 ENCOUNTER — Other Ambulatory Visit: Payer: Self-pay | Admitting: Family

## 2019-03-21 ENCOUNTER — Other Ambulatory Visit: Payer: Self-pay | Admitting: Family

## 2019-03-22 ENCOUNTER — Encounter: Payer: Self-pay | Admitting: Family

## 2019-03-22 ENCOUNTER — Telehealth: Payer: Self-pay | Admitting: Medical

## 2019-03-22 ENCOUNTER — Ambulatory Visit (HOSPITAL_BASED_OUTPATIENT_CLINIC_OR_DEPARTMENT_OTHER)
Admission: RE | Admit: 2019-03-22 | Discharge: 2019-03-22 | Disposition: A | Discharge status: Home / Self Care | Payer: Self-pay | Source: Ambulatory Visit | Attending: Medical | Admitting: Medical

## 2019-03-22 ENCOUNTER — Other Ambulatory Visit: Payer: Self-pay

## 2019-03-22 ENCOUNTER — Telehealth: Payer: Self-pay | Admitting: Family

## 2019-03-22 ENCOUNTER — Ambulatory Visit (INDEPENDENT_AMBULATORY_CARE_PROVIDER_SITE_OTHER): Payer: Self-pay | Admitting: Medical

## 2019-03-22 VITALS — BP 118/80 | HR 103 | Temp 96.4°F | Ht 72.0 in | Wt 189.0 lb

## 2019-03-22 DIAGNOSIS — R000 Tachycardia, unspecified: Secondary | ICD-10-CM

## 2019-03-22 DIAGNOSIS — I10 Essential (primary) hypertension: Secondary | ICD-10-CM

## 2019-03-22 DIAGNOSIS — M545 Low back pain, unspecified: Secondary | ICD-10-CM

## 2019-03-22 DIAGNOSIS — E119 Type 2 diabetes mellitus without complications: Secondary | ICD-10-CM

## 2019-03-22 DIAGNOSIS — R519 Headache, unspecified: Secondary | ICD-10-CM | POA: Insufficient documentation

## 2019-03-22 DIAGNOSIS — R55 Syncope and collapse: Secondary | ICD-10-CM

## 2019-03-22 DIAGNOSIS — E876 Hypokalemia: Secondary | ICD-10-CM

## 2019-03-22 LAB — CBC WITH DIFFERENTIAL/PLATELET
Basophils Absolute: 0.1 10*3/uL (ref 0.0–0.1)
Basophils Relative: 0.8 % (ref 0.0–3.0)
Eosinophils Absolute: 0 10*3/uL (ref 0.0–0.7)
Eosinophils Relative: 0.5 % (ref 0.0–5.0)
HCT: 43.7 % (ref 39.0–52.0)
Hemoglobin: 14.3 g/dL (ref 13.0–17.0)
Lymphocytes Relative: 14.4 % (ref 12.0–46.0)
Lymphs Abs: 1 10*3/uL (ref 0.7–4.0)
MCHC: 32.7 g/dL (ref 30.0–36.0)
MCV: 87.9 fl (ref 78.0–100.0)
Monocytes Absolute: 0.7 10*3/uL (ref 0.1–1.0)
Monocytes Relative: 9.4 % (ref 3.0–12.0)
Neutro Abs: 5.2 10*3/uL (ref 1.4–7.7)
Neutrophils Relative %: 74.9 % (ref 43.0–77.0)
Platelets: 205 10*3/uL (ref 150.0–400.0)
RBC: 4.98 Mil/uL (ref 4.22–5.81)
RDW: 16.2 % — ABNORMAL HIGH (ref 11.5–15.5)
WBC: 7 10*3/uL (ref 4.0–10.5)

## 2019-03-22 LAB — COMPREHENSIVE METABOLIC PANEL
ALT: 28 U/L (ref 0–53)
AST: 40 U/L — ABNORMAL HIGH (ref 0–37)
Albumin: 4.1 g/dL (ref 3.5–5.2)
Alkaline Phosphatase: 124 U/L — ABNORMAL HIGH (ref 39–117)
BUN: 6 mg/dL (ref 6–23)
CO2: 26 mEq/L (ref 19–32)
Calcium: 9.9 mg/dL (ref 8.4–10.5)
Chloride: 95 mEq/L — ABNORMAL LOW (ref 96–112)
Creatinine, Ser: 0.8 mg/dL (ref 0.40–1.50)
GFR: 128.01 mL/min (ref >60.00)
Glucose, Bld: 221 mg/dL — ABNORMAL HIGH (ref 70–99)
Potassium: 3.9 mEq/L (ref 3.5–5.1)
Sodium: 131 mEq/L — ABNORMAL LOW (ref 135–145)
Total Bilirubin: 0.5 mg/dL (ref 0.2–1.2)
Total Protein: 7.8 g/dL (ref 6.0–8.3)

## 2019-03-22 LAB — MAGNESIUM: Magnesium: 1.8 mg/dL (ref 1.5–2.5)

## 2019-03-22 LAB — HEMOGLOBIN A1C: Hgb A1c MFr Bld: 8.7 % — ABNORMAL HIGH (ref 4.6–6.5)

## 2019-03-22 MED ORDER — HYDROCODONE-ACETAMINOPHEN 5-325 MG PO TABS: 1.0000 | ORAL_TABLET | Freq: Four times a day (QID) | ORAL | 0 refills | Status: DC | PRN

## 2019-03-22 NOTE — Patient Instructions (Addendum)
You have history complicated head injury following trauma in July.  Subsequent surgery with infections reported.  Recent fall last night with head contusion type injury.  Unclear whether or not you just lost her balance or had syncope getting up from sleep quickly.  With your overall history I do recommend best place to be evaluated in the emergency department.  On discussion you declined evaluation in the ED.  In light of this ordered CT of your head stat with stat labs.  Would asked that you wait for the stat CT results and not leave.  This is very important.  I will get the CT results and discuss with you the findings.  In the event of negative CT result if your symptoms worsen or change then would still recommend ED evaluation.  In that event you might need MRI.  So if CT is negative and symptoms worsen then would recommend emergency department evaluation at [facility] or at [facility].  This way MRI could be done.  We will follow labs and let you know those results as well.  Your EKG showed sinus rhythm.  You do have history of diabetes and you expressed not knowing how to dose Humalog sliding scale insulin.  Some giving you sliding scale to use.  Recommending that you use a low dose scale.  Check your sugars before meals and dose accordingly.  Make sure that you you eat with dosing.   For back pain will get lumbar spine xray. After ct head back will decide on medication to potentially give.  Educated on diabetes and how to use humalog sliding scale. Gave him scale and advised use low dose scale.  We will send copy of this note to your PCP.  Follow-up in 7 days or as needed.

## 2019-03-22 NOTE — Telephone Encounter (Signed)
Patient scheduled to see Mackie Pai PA at 11:20

## 2019-03-22 NOTE — Telephone Encounter (Signed)
Would you try to call pt tomorrow early afternoon and see how his ha is after fall? Or remind me and I will call him. If I am busy would like you to call and update me and then I can call him later.

## 2019-03-22 NOTE — Addendum Note (Signed)
Addended by: Anabel Halon on: 03/22/2019 02:37 PM   Modules accepted: Orders

## 2019-03-22 NOTE — Telephone Encounter (Signed)
Copied from [place] [phone]. Topic: General - Other >> Mar 22, 2019  1:33 PM Keene Breath wrote: Reason for CRM: Called to ask the ordering provider, PA Saguier to call regarding a stat report for the patient.  CB# [phone]

## 2019-03-22 NOTE — Telephone Encounter (Signed)
Radiologist called. Requesting call back.

## 2019-03-22 NOTE — Progress Notes (Addendum)
Subjective:    Patient ID: Willie [name], male    DOB: 28-Aug-1975, 43 y.o.   MRN: [id number]  HPI  Pt in for evaluation.  States last night getting up and going to bathroom he fell and hit his head. He is not sure if fell/slipped or passed out?   Pt does not have insurance and he states he does not want to go to ED although explained this would be recommended. He still declines after counseling.   Pt first ct after fall was 11/22/2018. Has history of Bilateral subdural hematomas, traumatic, craniotomy and evacuation.  Patient was admitted to the progressive care unit, he had frequent neuro checks, started on Keppra for seizure prophylaxis and was consulted by Neurosurgery.  Patient underwent bilateral craniotomy with subdural evacuation on July 28 with no major complications.  Postoperatively he was monitored in the neuro intensive care unit.  Patient will be discharged home, follow-up with Dr. Vertell Limber in 2 weeks, continue Keppra for 2 more weeks.  Pt states later  he has 3 surgeries following complications from subdural hemotama surgery.   Past ct report in October 23, 2018  Intermediate to low attenuation RIGHT frontal parietal subdural hematoma up to 8 mm thick with mild mass effect upon RIGHT Hemisphere.  No intraparenchymal hemorrhage or infarction.  1 mm LEFT-to-RIGHT midline shift.   Pt denies no alcohol use since June, 2020.   Pt ct by pcp 02-15-2019 report shows. IMPRESSION: 1. There has been interval removal of a left-sided subdural drain previously placed through a left frontal craniotomy. There is persistent left-sided subdural blood product which is substantially decreased when compared to prior examination dated 01/07/2019 with resolution of previously seen left pneumocephalus. Greatest coronal thickness of left-sided subdural collection is 5 mm, previously 12 mm.  2. There is no evidence of a new left-sided intracranial hemorrhage nor right-sided  hemorrhage.  3. Reduced left hemispheric edema and sulcal effacement compared to prior examination.   Pt sugar was 303 this morning. But last night 420. He states he guesses on amount of humalog. He often checks sugars after he eats.   Last night before the fall states got out of bed. He got up quickly. He is not sure if tripped or lost balance.  Current ha level 8/10.       Review of Systems  Constitutional: Negative for chills, fatigue and unexpected weight change.  HENT: Negative for dental problem.   Respiratory: Negative for cough, chest tightness, shortness of breath and wheezing.   Cardiovascular: Negative for chest pain and palpitations.  Gastrointestinal: Negative for abdominal distention, abdominal pain, nausea and vomiting.  Musculoskeletal: Negative for back pain.  Skin: Negative for rash.  Neurological: Positive for headaches. Negative for dizziness, syncope, weakness and numbness.       Maybe syncope last night. Not clear.  Hematological: Negative for adenopathy. Does not bruise/bleed easily.  Psychiatric/Behavioral: Negative for behavioral problems, dysphoric mood, sleep disturbance and suicidal ideas. The patient is not nervous/anxious.        Objective:   Physical Exam  General Mental Status- Alert. General Appearance- Not in acute distress.   Skin General: Color- Normal Color. Moisture- Normal Moisture.  Neck Carotid Arteries- Normal color. Moisture- Normal Moisture. No carotid bruits. No JVD.no pain on palpation.  Chest and Lung Exam Auscultation: Breath Sounds:-Normal.  Cardiovascular Auscultation:Rythm- Regular. Murmurs & Other Heart Sounds:Auscultation of the heart reveals- No Murmurs.  Abdomen Inspection:-Inspeection Normal. Palpation/Percussion:Note:No mass. Palpation and Percussion of the abdomen reveal- Non Tender,  Non Distended + BS, no rebound or guarding.    Neurologic Cranial Nerve exam:- CN III-XII intact(No nystagmus).  Strength:- 5/5 equal and symmetric strength both upper and lower extremities. Except for 3/5 left upper ext weakness but he states this is his baseline since surgery. Ambulates well walking down hall.  Cranium- left side old scar present. No indication of new trauma on inspection.       Assessment & Plan:  You have history complicated head injury following trauma in July.  Subsequent surgery with infections reported.  Recent fall last night with head contusion type injury.  Unclear whether or not you just lost  balance or had syncope getting up from sleep quickly.  With your overall history I do recommend best place to be evaluated in the emergency department.  On discussion you declined evaluation in the ED.  In light of this ordered CT of your head stat with stat labs.  Would asked that you wait for the stat CT results and not leave.  This is very important.  I will get the CT results and discuss with you the findings.  In the event of negative CT result if your symptoms worsen or change then would still recommend ED evaluation.  In that event you might need MRI.  So if CT is negative and symptoms worsen then would recommend emergency department evaluation at [facility] or at [facility].  This way MRI could be done.  We will follow labs and let you know those results as well.  Your EKG showed sinus rhythm.  You do have history of diabetes and you expressed not knowing how to dose Humalog sliding scale insulin.  Some giving you sliding scale to use.  Recommending that you use a low dose scale.  Check your sugars before meals and dose accordingly.  Make sure that you you eat with dosing.   For back pain will get lumbar spine xray. After ct head back will decide on medication to potentially give.  Educated on diabetes and how to use humalog sliding scale. Gave him scale and advised use low dose scale.  We will send copy of this note to your PCP.  40 minutes spent with pt. 50% of time  spent counseling on plan going forward. Extra time spent in light of concern that ED likely best place to be evaluated but he declined.  [phone] wants girlfriend to be on hippa)  Will only give 4 tab of norco for pain.(pt took 1200 mg motrin already today). Explained did not want to give more/explained in detail. Would defer any more to pcp. shredded rx. Pt changes his mind. His girlfriend does not want him on.

## 2019-03-22 NOTE — Telephone Encounter (Signed)
Can you please schedule pt a face to face visit today with somebody?  He will likely need a repeat head CT.

## 2019-03-23 NOTE — Telephone Encounter (Signed)
Discussed with radiologist. See result note.

## 2019-03-24 NOTE — Telephone Encounter (Signed)
I called pt today to see how he was doing. He states feels better in general with less ha. Not reporting any neurologic type signs or symptoms. Since ha less advised he could try combination of just one tylenol and one ibuprofen together every 6-8 hours if needed. I don't see need to repeat ct if head or advise ED presently. But based on hx and ct report finding did want to follow up with him today so called.

## 2019-04-06 ENCOUNTER — Emergency Department (HOSPITAL_BASED_OUTPATIENT_CLINIC_OR_DEPARTMENT_OTHER): Payer: Self-pay

## 2019-04-06 ENCOUNTER — Emergency Department (HOSPITAL_COMMUNITY): Payer: Self-pay

## 2019-04-06 ENCOUNTER — Other Ambulatory Visit: Payer: Self-pay

## 2019-04-06 ENCOUNTER — Emergency Department (HOSPITAL_BASED_OUTPATIENT_CLINIC_OR_DEPARTMENT_OTHER)
Admission: EM | Admit: 2019-04-06 | Discharge: 2019-04-07 | Disposition: A | Discharge status: Home / Self Care | Payer: Self-pay | Attending: Emergency Medicine | Admitting: Emergency Medicine

## 2019-04-06 ENCOUNTER — Encounter (HOSPITAL_BASED_OUTPATIENT_CLINIC_OR_DEPARTMENT_OTHER): Payer: Self-pay | Admitting: Emergency Medicine

## 2019-04-06 DIAGNOSIS — I10 Essential (primary) hypertension: Secondary | ICD-10-CM | POA: Insufficient documentation

## 2019-04-06 DIAGNOSIS — R519 Headache, unspecified: Secondary | ICD-10-CM | POA: Insufficient documentation

## 2019-04-06 DIAGNOSIS — R748 Abnormal levels of other serum enzymes: Secondary | ICD-10-CM | POA: Insufficient documentation

## 2019-04-06 DIAGNOSIS — R200 Anesthesia of skin: Secondary | ICD-10-CM | POA: Insufficient documentation

## 2019-04-06 DIAGNOSIS — Z79899 Other long term (current) drug therapy: Secondary | ICD-10-CM | POA: Insufficient documentation

## 2019-04-06 DIAGNOSIS — R4781 Slurred speech: Secondary | ICD-10-CM | POA: Insufficient documentation

## 2019-04-06 DIAGNOSIS — Z20828 Contact with and (suspected) exposure to other viral communicable diseases: Secondary | ICD-10-CM | POA: Insufficient documentation

## 2019-04-06 DIAGNOSIS — Z794 Long term (current) use of insulin: Secondary | ICD-10-CM | POA: Insufficient documentation

## 2019-04-06 DIAGNOSIS — E119 Type 2 diabetes mellitus without complications: Secondary | ICD-10-CM | POA: Insufficient documentation

## 2019-04-06 DIAGNOSIS — Z87891 Personal history of nicotine dependence: Secondary | ICD-10-CM | POA: Insufficient documentation

## 2019-04-06 LAB — COMPREHENSIVE METABOLIC PANEL
ALT: 198 U/L — ABNORMAL HIGH (ref 0–44)
AST: 676 U/L — ABNORMAL HIGH (ref 15–41)
Albumin: 3.4 g/dL — ABNORMAL LOW (ref 3.5–5.0)
Alkaline Phosphatase: 128 U/L — ABNORMAL HIGH (ref 38–126)
Anion gap: 12 (ref 5–15)
BUN: 11 mg/dL (ref 6–20)
CO2: 24 mmol/L (ref 22–32)
Calcium: 8.5 mg/dL — ABNORMAL LOW (ref 8.9–10.3)
Chloride: 97 mmol/L — ABNORMAL LOW (ref 98–111)
Creatinine, Ser: 0.67 mg/dL (ref 0.61–1.24)
GFR calc Af Amer: >60 mL/min (ref >60)
GFR calc non Af Amer: >60 mL/min (ref >60)
Glucose, Bld: 155 mg/dL — ABNORMAL HIGH (ref 70–99)
Potassium: 3.4 mmol/L — ABNORMAL LOW (ref 3.5–5.1)
Sodium: 133 mmol/L — ABNORMAL LOW (ref 135–145)
Total Bilirubin: 1 mg/dL (ref 0.3–1.2)
Total Protein: 6.5 g/dL (ref 6.5–8.1)

## 2019-04-06 LAB — CBC WITH DIFFERENTIAL/PLATELET
Abs Immature Granulocytes: 0 10*3/uL (ref 0.00–0.07)
Basophils Absolute: 0.1 10*3/uL (ref 0.0–0.1)
Basophils Relative: 1 %
Eosinophils Absolute: 0 10*3/uL (ref 0.0–0.5)
Eosinophils Relative: 1 %
HCT: 45.1 % (ref 39.0–52.0)
Hemoglobin: 15.6 g/dL (ref 13.0–17.0)
Immature Granulocytes: 0 %
Lymphocytes Relative: 26 %
Lymphs Abs: 1.1 10*3/uL (ref 0.7–4.0)
MCH: 28.4 pg (ref 26.0–34.0)
MCHC: 34.6 g/dL (ref 30.0–36.0)
MCV: 82.1 fL (ref 80.0–100.0)
Monocytes Absolute: 0.3 10*3/uL (ref 0.1–1.0)
Monocytes Relative: 8 %
Neutro Abs: 2.7 10*3/uL (ref 1.7–7.7)
Neutrophils Relative %: 64 %
Platelets: 222 10*3/uL (ref 150–400)
RBC: 5.49 MIL/uL (ref 4.22–5.81)
RDW: 14.1 % (ref 11.5–15.5)
WBC: 4.2 10*3/uL (ref 4.0–10.5)
nRBC: 0 % (ref 0.0–0.2)

## 2019-04-06 LAB — ETHANOL: Alcohol, Ethyl (B): 97 mg/dL — ABNORMAL HIGH (ref <10)

## 2019-04-06 LAB — LIPASE, BLOOD: Lipase: 18 U/L (ref 11–51)

## 2019-04-06 LAB — PROTIME-INR
INR: 1.1 (ref 0.8–1.2)
Prothrombin Time: 14.1 seconds (ref 11.4–15.2)

## 2019-04-06 LAB — SARS CORONAVIRUS 2 (TAT 6-24 HRS): SARS Coronavirus 2: NEGATIVE

## 2019-04-06 MED ORDER — METOCLOPRAMIDE HCL 5 MG/ML IJ SOLN
10.0000 mg | Freq: Once | INTRAMUSCULAR | Status: AC
  Administered 2019-04-06: 11:00:00 10 mg via INTRAVENOUS
  Filled 2019-04-06: qty 2

## 2019-04-06 MED ORDER — FENTANYL CITRATE (PF) 100 MCG/2ML IJ SOLN
INTRAMUSCULAR | Status: AC
  Filled 2019-04-06: qty 2

## 2019-04-06 MED ORDER — LORAZEPAM 1 MG PO TABS: 0.0000 mg | ORAL_TABLET | Freq: Two times a day (BID) | ORAL | Status: DC

## 2019-04-06 MED ORDER — FENTANYL CITRATE (PF) 100 MCG/2ML IJ SOLN
50.0000 ug | Freq: Once | INTRAMUSCULAR | Status: AC
  Administered 2019-04-06: 13:00:00 50 ug via INTRAVENOUS

## 2019-04-06 MED ORDER — VITAMIN B-1 100 MG PO TABS: 100.0000 mg | ORAL_TABLET | Freq: Every day | ORAL | Status: DC

## 2019-04-06 MED ORDER — GADOBUTROL 1 MMOL/ML IV SOLN
10.0000 mL | Freq: Once | INTRAVENOUS | Status: AC | PRN
  Administered 2019-04-06: 18:00:00 10 mL via INTRAVENOUS

## 2019-04-06 MED ORDER — LORAZEPAM 2 MG/ML IJ SOLN
0.0000 mg | Freq: Four times a day (QID) | INTRAMUSCULAR | Status: DC
  Administered 2019-04-06 (×3): 1 mg via INTRAVENOUS
  Filled 2019-04-06 (×3): qty 1

## 2019-04-06 MED ORDER — LORAZEPAM 1 MG PO TABS: 0.0000 mg | ORAL_TABLET | Freq: Four times a day (QID) | ORAL | Status: DC

## 2019-04-06 MED ORDER — THIAMINE HCL 100 MG/ML IJ SOLN
100.0000 mg | Freq: Every day | INTRAMUSCULAR | Status: DC
  Administered 2019-04-06: 11:00:00 100 mg via INTRAVENOUS
  Filled 2019-04-06: qty 2

## 2019-04-06 MED ORDER — LORAZEPAM 2 MG/ML IJ SOLN: 0.0000 mg | Freq: Two times a day (BID) | INTRAMUSCULAR | Status: DC

## 2019-04-06 MED ORDER — SODIUM CHLORIDE 0.9 % IV BOLUS
1000.0000 mL | Freq: Once | INTRAVENOUS | Status: AC
  Administered 2019-04-06: 11:00:00 1000 mL via INTRAVENOUS

## 2019-04-06 NOTE — ED Notes (Signed)
Spoke to [employer], Agricultural consultant at [facility] ED, made aware about pt's transfer and near transport via [facility]  to their facility.

## 2019-04-06 NOTE — ED Provider Notes (Cosign Needed)
  Physical Exam  BP (!) 140/108 (BP Location: Right Arm)   Pulse 99   Temp 98.3 F (36.8 C) (Oral)   Resp 16   Ht 6' (1.829 m)   Wt 82.7 kg   SpO2 100%   BMI 24.73 kg/m   Physical Exam Gen: appears nontoxic CV: no tachycarida Msk: no tremors Foot: Pulses intact bilaterally.  Equal color and cap refill.  Equal warmth.  Decreased sensation of the dorsal and plantar left foot   ED Course/Procedures     Procedures  MDM    Pt seen initially by A Harris, PA-C, please see previous notes for further history.  In brief, patient presenting for evaluation of left foot numbness, tingling, and clonus.  Began 3 days ago.  No other neurologic deficits noted on exam.  No signs of ischemia.  Case was discussed with Dr. Cheral Marker who recommended MRI with and without.  If negative and patient is ambulatory, can likely be discharged. Of note, patient's LFTs are elevated.  This has been elevated in the past.  He is currently without nausea, vomiting, abdominal pain.  Likely related to his alcohol intake.  Discussed with patient.  He can likely follow-up with GI outpatient unless abdominal exam changes significantly.  Pt signed out to Willie Rona Ravens, PA-C for f/u on MRI.        Willie Heidelberg, PA-C 04/06/19 1530

## 2019-04-06 NOTE — ED Provider Notes (Signed)
Received sign out at beginning of shift.  Please refer to previous provider's note for full H&P. L foot tingling/numbness x 3 days.  Hx of alcohol abuse.  Awaits brain MRI.  No evidence of limb ischemia.  transaminitis likely 2/2 alcohol abuse.  outpt GI f/u.  Check PT/INR.  Can d/c if MRI normal.  On CIWA protocol.   7:23 PM Brain right has finally resulted.  Motion degraded examination however no evidence of acute infarct and no significant concerning changes.  Patient however have eloped prior to receiving MRI result.    BP (!) 140/108 (BP Location: Right Arm)   Pulse 99   Temp 98.3 F (36.8 C) (Oral)   Resp 16   Ht 6' (1.829 m)   Wt 82.7 kg   SpO2 100%   BMI 24.73 kg/m   Results for orders placed or performed during the hospital encounter of 04/06/19  SARS CORONAVIRUS 2 (TAT 6-24 HRS) Nasopharyngeal Nasopharyngeal Swab   Specimen: Nasopharyngeal Swab  Result Value Ref Range   SARS Coronavirus 2 NEGATIVE NEGATIVE  Ethanol  Result Value Ref Range   Alcohol, Ethyl (B) 97 (H) <10 mg/dL  Comprehensive metabolic panel  Result Value Ref Range   Sodium 133 (L) 135 - 145 mmol/L   Potassium 3.4 (L) 3.5 - 5.1 mmol/L   Chloride 97 (L) 98 - 111 mmol/L   CO2 24 22 - 32 mmol/L   Glucose, Bld 155 (H) 70 - 99 mg/dL   BUN 11 6 - 20 mg/dL   Creatinine, Ser 0.67 0.61 - 1.24 mg/dL   Calcium 8.5 (L) 8.9 - 10.3 mg/dL   Total Protein 6.5 6.5 - 8.1 g/dL   Albumin 3.4 (L) 3.5 - 5.0 g/dL   AST 676 (H) 15 - 41 U/L   ALT 198 (H) 0 - 44 U/L   Alkaline Phosphatase 128 (H) 38 - 126 U/L   Total Bilirubin 1.0 0.3 - 1.2 mg/dL   GFR calc non Af Amer >60 >60 mL/min   GFR calc Af Amer >60 >60 mL/min   Anion gap 12 5 - 15  CBC with Differential  Result Value Ref Range   WBC 4.2 4.0 - 10.5 K/uL   RBC 5.49 4.22 - 5.81 MIL/uL   Hemoglobin 15.6 13.0 - 17.0 g/dL   HCT 45.1 39.0 - 52.0 %   MCV 82.1 80.0 - 100.0 fL   MCH 28.4 26.0 - 34.0 pg   MCHC 34.6 30.0 - 36.0 g/dL   RDW 14.1 11.5 - 15.5 %   Platelets  222 150 - 400 K/uL   nRBC 0.0 0.0 - 0.2 %   Neutrophils Relative % 64 %   Neutro Abs 2.7 1.7 - 7.7 K/uL   Lymphocytes Relative 26 %   Lymphs Abs 1.1 0.7 - 4.0 K/uL   Monocytes Relative 8 %   Monocytes Absolute 0.3 0.1 - 1.0 K/uL   Eosinophils Relative 1 %   Eosinophils Absolute 0.0 0.0 - 0.5 K/uL   Basophils Relative 1 %   Basophils Absolute 0.1 0.0 - 0.1 K/uL   Immature Granulocytes 0 %   Abs Immature Granulocytes 0.00 0.00 - 0.07 K/uL  Lipase, blood  Result Value Ref Range   Lipase 18 11 - 51 U/L  Protime-INR  Result Value Ref Range   Prothrombin Time 14.1 11.4 - 15.2 seconds   INR 1.1 0.8 - 1.2   Dg Lumbar Spine 2-3 Views  Result Date: 03/22/2019 CLINICAL DATA:  43 year old male with fall and  back pain. EXAM: LUMBAR SPINE - 2-3 VIEW COMPARISON:  CT of the abdomen pelvis dated 10/13/2017 and intraoperative lumbar radiograph dated 12/19/2005. FINDINGS: There is no acute fracture or subluxation of the lumbar spine. Grade 1 L5-S1 retrolisthesis. The vertebral body heights and disc spaces are maintained. The visualized posterior elements are intact. The soft tissues are unremarkable. IMPRESSION: No acute/traumatic lumbar spine pathology. Electronically Signed   By: Anner Crete M.D.   On: 03/22/2019 13:24   Ct Head Wo Contrast  Result Date: 04/06/2019 CLINICAL DATA:  Focal neural deficit with stroke suspected. Headache for 1 week with left foot numbness and tingling for 3 days. EXAM: CT HEAD WITHOUT CONTRAST TECHNIQUE: Contiguous axial images were obtained from the base of the skull through the vertex without intravenous contrast. COMPARISON:  03/22/2019 FINDINGS: Brain: No evidence of acute infarction, hemorrhage, hydrocephalus, extra-axial collection or mass lesion/mass effect. Sequela of bilateral subdural drainage with chronic membrane and subdural low-density along the left frontal hemisphere, without mass effect or interval change. Age advanced cerebral volume loss. Vascular: No  hyperdense vessel or unexpected calcification. Skull: Stable changes of bilateral craniotomy for subdural drainage. There is left more than right scalp scarring which is stable. Sinuses/Orbits: Negative IMPRESSION: 1. No acute finding or change from CT last month. 2. Sequela of bilateral subdural hemorrhage and drainage with chronic collection on the left not causing cerebral mass effect. Electronically Signed   By: Monte Fantasia M.D.   On: 04/06/2019 10:09   Ct Head Wo Contrast  Addendum Date: 03/22/2019   ADDENDUM REPORT: 03/22/2019 14:05 ADDENDUM: These results were called by telephone at the time of interpretation on 03/22/2019 at 2:04 pm to provider Mackie Pai , who verbally acknowledged these results. Electronically Signed   By: Anner Crete M.D.   On: 03/22/2019 14:05   Result Date: 03/22/2019 CLINICAL DATA:  43 year old male with recent fall. EXAM: CT HEAD WITHOUT CONTRAST TECHNIQUE: Contiguous axial images were obtained from the base of the skull through the vertex without intravenous contrast. COMPARISON:  Head CT dated 02/15/2019. FINDINGS: Brain: Left frontal subdural collection measures approximately 6 mm in thickness on coronal series 4, image 31 (previously 7 mm). Thin crescentic high attenuating density within this collection appears similar or minimally increased since the prior CT. A recurrent bleed is not excluded. Clinical correlation is recommended. No other acute intracranial hemorrhage identified. No significant mass effect. No midline shift. Vascular: No hyperdense vessel or unexpected calcification. Skull: Postsurgical changes of bilateral frontal craniotomy. No acute calvarial pathology. Sinuses/Orbits: The visualized paranasal sinuses and mastoid air cells are clear. Other: None IMPRESSION: No significant interval change in the size of the left frontal subdural collection since the study of 02/15/2019. However, there may be minimal increase in the high attenuating content  within this collection. Recurrent bleed is not excluded. Clinical correlation is recommended. Electronically Signed: By: Anner Crete M.D. On: 03/22/2019 13:45   Mr Jeri Cos And Wo Contrast  Result Date: 04/06/2019 CLINICAL DATA:  Focal neuro deficit, greater than 6 hours, stroke suspected. Additional history provided: Left foot numbness, tingling and clonus which began 3 days ago. EXAM: MRI HEAD WITHOUT AND WITH CONTRAST TECHNIQUE: Multiplanar, multiecho pulse sequences of the brain and surrounding structures were obtained without and with intravenous contrast. CONTRAST:  59mL GADAVIST GADOBUTROL 1 MMOL/ML IV SOLN COMPARISON:  CT head performed earlier the same day 04/06/2019, MRI/MRA head 06/08/2017 FINDINGS: Brain: Multiple sequences are motion degraded. Most notably, there is moderate motion degradation of the axial T2  FLAIR sequence and coronal acquired T2 weighted sequence. There is no convincing evidence of acute infarct. No evidence of intracranial mass.No midline shift. No focal parenchymal signal abnormality. Again demonstrated is a thin holohemispheric left subdural collection, measuring up to 7 mm in thickness. No significant mass effect upon the underlying left cerebral hemisphere. There is enhancement associated with the left-sided subdural collection. There is also mild nonspecific pachymeningeal dural thickening and enhancement on the right, likely related to previous right subdural collection. Otherwise, no abnormal intracranial enhancement is demonstrated. Mild generalized parenchymal atrophy. Vascular: Flow voids maintained within the proximal large arterial vessels. Expected enhancement within the dural venous sinuses. Skull and upper cervical spine: Bilateral craniotomies from previous subdural drainage. Sinuses/Orbits: Visualized orbits demonstrate no acute abnormality. Trace left maxillary sinus mucosal thickening. No significant mastoid effusion. IMPRESSION: 1. Motion degraded  examination. 2. No evidence of acute infarct. 3. Unchanged thin left holohemispheric subdural collection without significant mass effect upon the underlying left cerebral hemisphere. 4. Mild right-sided pachymeningeal dural thickening and enhancement, likely related to previous right subdural collection. 5. Age advanced generalized parenchymal atrophy. Electronically Signed   By: Kellie Simmering DO   On: 04/06/2019 18:10   US Abdomen Limited Ruq  Result Date: 04/06/2019 CLINICAL DATA:  Elevated liver enzymes EXAM: ULTRASOUND ABDOMEN LIMITED RIGHT UPPER QUADRANT COMPARISON:  CT abdomen 10/13/2017 FINDINGS: Gallbladder: No gallstones or wall thickening visualized. No sonographic Murphy sign noted by sonographer. Common bile duct: Diameter: 4.7 mm Liver: Increased hepatic parenchymal echogenicity. 8 mm anechoic hepatic mass likely reflecting a cyst. Portal vein is patent on color Doppler imaging with normal direction of blood flow towards the liver. Other: None. IMPRESSION: 1. Increased hepatic echogenicity consistent with hepatic steatosis. Electronically Signed   By: Kathreen Devoid   On: 04/06/2019 12:03      Domenic Moras, PA-C 04/06/19 2023    Carmin Muskrat, MD 04/07/19 1743

## 2019-04-06 NOTE — ED Provider Notes (Signed)
[facility] EMERGENCY DEPARTMENT Provider Note   CSN: [id number] Arrival date & time: 04/06/19  2409     History   Chief Complaint Chief Complaint  Patient presents with  . multiple complaints    HPI Willie [name] is a 43 y.o. male with past medical history of alcohol abuse and dependence, insulin-dependent diabetes mellitus, recurrent alcoholic pancreatitis, mood disorder and hypertension.  His history is complex and complicated. patient initially developed bilateral hematomas after a suppose it fall in [place] in July 2020.  He underwent bilateral craniectomy with Dr. Vertell Limber 1 month ago he underwent bilateral craniotomy for subdural hematomas and was readmitted August 22nd for left recurrent subdural hematoma which required twist-drill crani and drainage. 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  Patient states that he has had daily headaches however 4 days ago his headache worsened significantly.  He has had associated nausea but denies vomiting.  He has not been unable to drink any alcohol for the past 2 days and has been unable to take his medications.  He does admit to feeling agitated and tremulous which is normal for him when he does not drink alcohol.  Patient states that 4 days ago he also noticed the development of paresthesia of the left foot which has been progressively worsening over the past 4 days.  Nothing seems to make it worse or better.  He is also had slowed, difficult speech for the past 4 days which is apparently new.  He has chronic right upper extremity weakness from previous subdurals.  He denies any weakness in the lower extremities .  He denies back pain, fevers, diarrhea.  This morning the patient's blood sugar at home was 232.  HPI  Past Medical History:  Diagnosis Date  . Alcohol abuse   .  Alcoholic pancreatitis    recurrent  . Depression   . Diabetes mellitus, type II ([place])    New [place]set 03/2010  . GERD (gastroesophageal reflux disease)   . High cholesterol   . 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 Hypertension   . Sleep apnea    does not wear a CPAP    Patient Active Problem List   Diagnosis Date Noted  . Malnutrition of moderate degree 01/09/2019  . 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 Subdural bleeding ([place]) 11/24/2018  . Abdominal pain 11/22/2018  . SDH (subdural hematoma) ([place]) 11/22/2018  . Abnormal LFTs 11/22/2018  . IDDM (insulin dependent diabetes mellitus) 09/12/2018  . Acute hepatitis 09/11/2018  . Alcohol-induced mood disorder ([place])   . DKA (diabetic ketoacidoses) ([place]) 07/28/2017  . Alcohol withdrawal ([place]) 07/28/2017  . Abnormal CT scan, colon   . Benign neoplasm of descending colon   . Lower GI bleed 07/04/2017  . Transaminitis 07/04/2017  . Pain   . Bleeding internal hemorrhoids   . Anemia of chronic disease   . Left arm weakness   . Anemia, blood loss   . Vomiting alone 03/27/2017  . Chest pain 03/27/2017  . Nausea and vomiting   . Lactic acidosis 03/26/2017  . History of alcohol abuse 06/25/2016  . Tobacco abuse 06/25/2016  . Anxiety state 11/20/2015  . Dental infection 07/10/2015  . Mood disorder ([place]) 04/18/2015  . Eczema 06/27/2014  . GERD (gastroesophageal reflux disease) 05/24/2014  .  Snoring 04/07/2013  . Suicide attempt by substance overdose ([place]) 02/10/2013  . Alcohol abuse 12/31/2012  . Erectile dysfunction 12/09/2012  . Hematuria 06/11/2012  . Vision problem 02/02/2012  . Insomnia 12/20/2011  . Chronic pancreatitis ([place]) 02/05/2011  . Depression with anxiety 11/13/2010  . Hyperlipemia 06/27/2010  . Diabetes type 2, uncontrolled ([place]) 05/22/2010  . Essential hypertension 05/22/2010    Past Surgical History:  Procedure Laterality Date  . COLONOSCOPY W/ BIOPSIES  09/2010    Dr. Ardis Hughs.  for intermittent rectal bleeding.  Mild sigmoid to descending diverticulosis.  Mild left colon erythema, benign biopsy, probably "prep effect"  . COLONOSCOPY WITH PROPOFOL N/A 07/06/2017   Procedure: COLONOSCOPY WITH PROPOFOL;  Surgeon: Jerene Bears, MD;  Location: [facility];  Service: Gastroenterology;  Laterality: N/A;  . CRANIOTOMY Bilateral 11/24/2018   Procedure: Bilateral craniotomy for evacuation of subdural hematoma;  Surgeon: Erline Levine, MD;  Location: [facility];  Service: Neurosurgery;  Laterality: Bilateral;  Bilateral craniotomy for evacuation of subdural hematoma  . CRANIOTOMY Left 01/06/2019   Procedure: CRANIECTOMY;  Surgeon: Earnie Larsson, MD;  Location: [facility];  Service: Neurosurgery;  Laterality: Left;  . LUMBAR MICRODISCECTOMY  ~ 2004   Dr Ellene Route        Home Medications    Prior to Admission medications   Medication Sig Start Date End Date Taking? Authorizing Provider  amLODipine (NORVASC) 5 MG tablet Take 1 tablet (5 mg total) by mouth daily. 02/15/19   Debbrah Alar, NP  blood glucose meter kit and supplies KIT Dispense based on patient and insurance preference. Use up to four times daily as directed. (FOR ICD-9 250.00, 250.01). 07/24/18   Debbrah Alar, NP  Continuous Blood Gluc Sensor (FREESTYLE LIBRE 14 DAY SENSOR) MISC Inject 1 each into the skin every 14 (fourteen) days. 04/27/18   Debbrah Alar, NP  escitalopram (LEXAPRO) 20 MG tablet TAKE ONE TABLET BY MOUTH DAILY 03/19/19   Debbrah Alar, NP  folic acid (FOLVITE) 1 MG tablet Take 1 tablet (1 mg total) by mouth daily. 02/03/19   Debbrah Alar, NP  glucose blood (FREESTYLE LITE) test strip Use as instructed 07/24/18   Debbrah Alar, NP  HYDROcodone-acetaminophen (NORCO) 5-325 MG tablet Take 1 tablet by mouth every 6 (six) hours as needed for moderate pain. 03/22/19   Saguier, Percell Miller, PA-C  Insulin Glargine (LANTUS SOLOSTAR) 100 UNIT/ML Solostar Pen Inject 20-25 Units  into the skin at bedtime. Patient taking differently: Inject 25 Units into the skin at bedtime.  12/04/18   Debbrah Alar, NP  insulin lispro (HUMALOG KWIKPEN) 100 UNIT/ML KwikPen Inject subcutaneously 3 times a day (just before each meal) 09-10-08 units Humalog 5 units with small meal, 10 units with medium sized meal, and 15 units with large meal. Patient taking differently: Inject 5-10 Units into the skin See admin instructions. Inject 5-10 units subcutaneously with each meal - based on CBG 12/04/18   Debbrah Alar, NP  Insulin Pen Needle (ULTICARE SHORT PEN NEEDLES) 31G X 8 MM MISC Use once daily to inject insulin. 04/27/18   Debbrah Alar, NP  Lancets (FREESTYLE) lancets 2 (two) times daily 02/27/17   Debbrah Alar, NP  levETIRAcetam (KEPPRA) 500 MG tablet Take 1 tablet (500 mg total) by mouth 2 (two) times daily. 01/14/19 02/13/19  Erline Levine, MD  LORazepam (ATIVAN) 1 MG tablet TAKE ONE TABLET BY MOUTH EVERY 8 HOURS AS NEEDED FOR ANXIETY 03/10/19   Debbrah Alar, NP  Multiple Vitamins-Minerals (MULTIVITAMIN) tablet Take 1 tablet by mouth daily. 01/14/19  Erline Levine, MD  ondansetron (ZOFRAN ODT) 4 MG disintegrating tablet '4mg'$  ODT q4 hours prn nausea/vomit Patient taking differently: Take 4 mg by mouth every 4 (four) hours as needed for nausea or vomiting.  10/13/17   Malvin Johns, MD  pantoprazole (PROTONIX) 40 MG tablet Take 1 tablet (40 mg total) by mouth 2 (two) times daily. 01/14/19   Erline Levine, MD  QUEtiapine (SEROQUEL) 300 MG tablet TAKE ONE TABLET BY MOUTH AT BEDTIME 03/22/19   Debbrah Alar, NP  sildenafil (REVATIO) 20 MG tablet TAKE 1-2 TABLETS BY MOUTH PRIOR TO SEXUAL ACTIVITY 03/22/19   Debbrah Alar, NP    Family History Family History  Problem Relation Age of Onset  . Diabetes Mother   . Lung cancer Brother        twin brother  . Pancreatic cancer Paternal Aunt   . Colon cancer Neg Hx   . Stomach cancer Neg Hx     Social History  Social History   Tobacco Use  . Smoking status: Former Smoker    Packs/day: 0.25    Years: 1.00    Pack years: 0.25    Types: Cigarettes    Start date: 04/29/2016  . Smokeless tobacco: Never Used  . Tobacco comment: 4 Cigarettes a day   Substance Use Topics  . Alcohol use: Yes    Comment: 12 pack beer a day  . Drug use: Not Currently    Comment: none in years     Allergies   Invokamet [canagliflozin-metformin hcl] and Metformin and related   Review of Systems Review of Systems  Ten systems reviewed and are negative for acute change, except as noted in the HPI.   Physical Exam Updated Vital Signs BP (!) 153/119 (BP Location: Right Arm) Comment: Not taken his BP med this morning  Pulse (!) 114   Temp 98.3 F (36.8 C) (Oral)   Resp 18   Ht 6' (1.829 m)   Wt 82.7 kg   SpO2 100%   BMI 24.73 kg/m   Physical Exam Vitals signs and nursing note reviewed.  Constitutional:      General: He is not in acute distress.    Appearance: He is well-developed. He is not diaphoretic.  HENT:     Head: Normocephalic and atraumatic.  Eyes:     General: No scleral icterus.    Extraocular Movements: Extraocular movements intact.     Conjunctiva/sclera: Conjunctivae normal.     Pupils: Pupils are equal, round, and reactive to light.  Neck:     Musculoskeletal: Normal range of motion and neck supple.  Cardiovascular:     Rate and Rhythm: Normal rate and regular rhythm.     Heart sounds: Normal heart sounds.  Pulmonary:     Effort: Pulmonary effort is normal. No respiratory distress.     Breath sounds: Normal breath sounds.  Abdominal:     Palpations: Abdomen is soft.     Tenderness: There is no abdominal tenderness.  Skin:    General: Skin is warm and dry.  Neurological:     Mental Status: He is alert and oriented to person, place, and time.     GCS: GCS eye subscore is 4. GCS verbal subscore is 5. GCS motor subscore is 6.     Cranial Nerves: Cranial nerves are intact.      Sensory: Sensory deficit (left foot with diminished sensation to light touch- does not pass the ankle) present.     Motor: Weakness (RUE) and tremor present.  No pronator drift.     Coordination: Coordination is intact. Finger-Nose-Finger Test normal.     Deep Tendon Reflexes: Reflexes are normal and symmetric.     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      ED Treatments / Results  Labs (all labs ordered are listed, but only abnormal results are displayed) Labs Reviewed - No data to display  EKG None  Radiology No results found.  Procedures Procedures (including critical care time)  Medications Ordered in ED Medications - No data to display   Initial Impression / Assessment and Plan / ED Course  I have reviewed the triage vital signs and the nursing notes.  Pertinent labs & imaging results that were available during my care of the patient were reviewed by me and considered in my medical decision making (see chart for details).        43 year old male here with complaint of headache, slurred speech, left foot numbness. Differential diagnosis includes peripheral neuropathy, recurrent subdural versus brain abscess, stroke.  I personally reviewed the patient's labs which show low sodium at 133 of insignificant value in the setting of elevated blood glucose.  Patient's AST ALT and alkaline phosphatase are significantly more elevated than previous values.  He has been admitted in the past for acute hepatitis.  CBC without elevated white blood cell count or other abnormality.  Patient's alcohol level at 97.  This is also in the setting of presumed alcohol withdrawal symptoms including anxiety, tremulousness, tachycardia and hypertension which have improved with use of benzodiazepines.  I personally reviewed the patient's CT head which shows no acute intracranial pathology.  His ultrasound of the  right upper quadrant is also negative for acute abnormality.  I discussed the case with Dr. Cheral Marker who asked that the patient be transferred for MRI with and without contrast.  Patient is stable and appears appropriate for transfer at this time.  I discussed the findings with the patient is agreement with the plan.  Final Clinical Impressions(s) / ED Diagnoses   Final diagnoses:  None    ED Discharge Orders    None       Margarita Mail, PA-C 04/06/19 [place], DO 04/06/19 1524

## 2019-04-06 NOTE — ED Triage Notes (Signed)
Pt reports headache x 1 week, left foot numbness/ tingling x 3 days, nausea this Am, CBG this morning 268 per pt. Ambulated to room with steady gait.

## 2019-04-07 ENCOUNTER — Other Ambulatory Visit: Payer: Self-pay | Admitting: Family

## 2019-04-21 ENCOUNTER — Emergency Department (HOSPITAL_COMMUNITY)
Admission: EM | Admit: 2019-04-21 | Discharge: 2019-04-21 | Disposition: A | Discharge status: Home / Self Care | Payer: Self-pay | Attending: Emergency Medicine | Admitting: Emergency Medicine

## 2019-04-21 ENCOUNTER — Other Ambulatory Visit: Payer: Self-pay

## 2019-04-21 ENCOUNTER — Encounter (HOSPITAL_COMMUNITY): Payer: Self-pay | Admitting: *Deleted

## 2019-04-21 ENCOUNTER — Emergency Department (HOSPITAL_COMMUNITY): Payer: Self-pay

## 2019-04-21 DIAGNOSIS — R7401 Elevation of levels of liver transaminase levels: Secondary | ICD-10-CM | POA: Insufficient documentation

## 2019-04-21 DIAGNOSIS — Z794 Long term (current) use of insulin: Secondary | ICD-10-CM | POA: Insufficient documentation

## 2019-04-21 DIAGNOSIS — Z87891 Personal history of nicotine dependence: Secondary | ICD-10-CM | POA: Insufficient documentation

## 2019-04-21 DIAGNOSIS — I10 Essential (primary) hypertension: Secondary | ICD-10-CM | POA: Insufficient documentation

## 2019-04-21 DIAGNOSIS — E119 Type 2 diabetes mellitus without complications: Secondary | ICD-10-CM | POA: Insufficient documentation

## 2019-04-21 DIAGNOSIS — F10920 Alcohol use, unspecified with intoxication, uncomplicated: Secondary | ICD-10-CM | POA: Insufficient documentation

## 2019-04-21 DIAGNOSIS — Z79899 Other long term (current) drug therapy: Secondary | ICD-10-CM | POA: Insufficient documentation

## 2019-04-21 DIAGNOSIS — E876 Hypokalemia: Secondary | ICD-10-CM | POA: Insufficient documentation

## 2019-04-21 LAB — URINALYSIS, ROUTINE W REFLEX MICROSCOPIC
Bacteria, UA: NONE SEEN
Bilirubin Urine: NEGATIVE
Glucose, UA: >=500 mg/dL — AB
Hgb urine dipstick: NEGATIVE
Ketones, ur: NEGATIVE mg/dL
Leukocytes,Ua: NEGATIVE
Nitrite: NEGATIVE
Protein, ur: NEGATIVE mg/dL
Specific Gravity, Urine: 1.012 (ref 1.005–1.030)
pH: 6 (ref 5.0–8.0)

## 2019-04-21 LAB — CBC WITH DIFFERENTIAL/PLATELET
Abs Immature Granulocytes: 0.01 10*3/uL (ref 0.00–0.07)
Basophils Absolute: 0 10*3/uL (ref 0.0–0.1)
Basophils Relative: 1 %
Eosinophils Absolute: 0 10*3/uL (ref 0.0–0.5)
Eosinophils Relative: 1 %
HCT: 41.1 % (ref 39.0–52.0)
Hemoglobin: 14.1 g/dL (ref 13.0–17.0)
Immature Granulocytes: 0 %
Lymphocytes Relative: 23 %
Lymphs Abs: 1.2 10*3/uL (ref 0.7–4.0)
MCH: 29.4 pg (ref 26.0–34.0)
MCHC: 34.3 g/dL (ref 30.0–36.0)
MCV: 85.6 fL (ref 80.0–100.0)
Monocytes Absolute: 0.6 10*3/uL (ref 0.1–1.0)
Monocytes Relative: 12 %
Neutro Abs: 3.4 10*3/uL (ref 1.7–7.7)
Neutrophils Relative %: 63 %
Platelets: 370 10*3/uL (ref 150–400)
RBC: 4.8 MIL/uL (ref 4.22–5.81)
RDW: 15 % (ref 11.5–15.5)
WBC: 5.3 10*3/uL (ref 4.0–10.5)
nRBC: 0 % (ref 0.0–0.2)

## 2019-04-21 LAB — COMPREHENSIVE METABOLIC PANEL
ALT: 86 U/L — ABNORMAL HIGH (ref 0–44)
AST: 107 U/L — ABNORMAL HIGH (ref 15–41)
Albumin: 3.9 g/dL (ref 3.5–5.0)
Alkaline Phosphatase: 145 U/L — ABNORMAL HIGH (ref 38–126)
Anion gap: 10 (ref 5–15)
BUN: <5 mg/dL — ABNORMAL LOW (ref 6–20)
CO2: 25 mmol/L (ref 22–32)
Calcium: 8.5 mg/dL — ABNORMAL LOW (ref 8.9–10.3)
Chloride: 101 mmol/L (ref 98–111)
Creatinine, Ser: 0.9 mg/dL (ref 0.61–1.24)
GFR calc Af Amer: >60 mL/min (ref >60)
GFR calc non Af Amer: >60 mL/min (ref >60)
Glucose, Bld: 303 mg/dL — ABNORMAL HIGH (ref 70–99)
Potassium: 3 mmol/L — ABNORMAL LOW (ref 3.5–5.1)
Sodium: 136 mmol/L (ref 135–145)
Total Bilirubin: 0.4 mg/dL (ref 0.3–1.2)
Total Protein: 7.5 g/dL (ref 6.5–8.1)

## 2019-04-21 LAB — RAPID URINE DRUG SCREEN, HOSP PERFORMED
Amphetamines: NOT DETECTED
Barbiturates: NOT DETECTED
Benzodiazepines: NOT DETECTED
Cocaine: NOT DETECTED
Opiates: NOT DETECTED
Tetrahydrocannabinol: POSITIVE — AB

## 2019-04-21 LAB — MAGNESIUM: Magnesium: 2.1 mg/dL (ref 1.7–2.4)

## 2019-04-21 LAB — CBG MONITORING, ED: Glucose-Capillary: 289 mg/dL — ABNORMAL HIGH (ref 70–99)

## 2019-04-21 LAB — TROPONIN I (HIGH SENSITIVITY)
Troponin I (High Sensitivity): 5 ng/L (ref <18)
Troponin I (High Sensitivity): 6 ng/L (ref <18)

## 2019-04-21 LAB — ETHANOL: Alcohol, Ethyl (B): 368 mg/dL (ref <10)

## 2019-04-21 MED ORDER — STERILE WATER FOR INJECTION IJ SOLN
INTRAMUSCULAR | Status: AC
  Administered 2019-04-21: 1.2 mL
  Filled 2019-04-21: qty 10

## 2019-04-21 MED ORDER — ZIPRASIDONE MESYLATE 20 MG IM SOLR
10.0000 mg | Freq: Once | INTRAMUSCULAR | Status: AC
  Administered 2019-04-21: 10 mg via INTRAMUSCULAR
  Filled 2019-04-21: qty 20

## 2019-04-21 MED ORDER — POTASSIUM CHLORIDE CRYS ER 20 MEQ PO TBCR
40.0000 meq | EXTENDED_RELEASE_TABLET | Freq: Once | ORAL | Status: AC
  Administered 2019-04-21: 40 meq via ORAL
  Filled 2019-04-21: qty 2

## 2019-04-21 MED ORDER — LORAZEPAM 2 MG/ML IJ SOLN
1.0000 mg | Freq: Once | INTRAMUSCULAR | Status: AC
  Administered 2019-04-21: 1 mg via INTRAVENOUS
  Filled 2019-04-21: qty 1

## 2019-04-21 MED ORDER — SODIUM CHLORIDE 0.9 % IV BOLUS
1000.0000 mL | Freq: Once | INTRAVENOUS | Status: AC
  Administered 2019-04-21: 1000 mL via INTRAVENOUS

## 2019-04-21 NOTE — ED Notes (Signed)
Date and time results received: 04/21/19  (use smartphrase ".now" to insert current time)  Test: 368 Critical Value: alcohol  Name of Provider Notified: Roxanne Mins  Orders Received? Or Actions Taken?: Dr Roxanne Mins notified

## 2019-04-21 NOTE — ED Provider Notes (Signed)
Pt is awake and more alert - has had recent SDH - feeling better now that he is sober - he is slightly off balance walking but states that he has been for a while - endorsees excessive ETOH intake yesterday - no SI / AVH and no CP . SOB and no ha at this time. Seems stable for discharge with sober ride.  Orthostatics normal   Noemi Chapel, MD 04/21/19 1112

## 2019-04-21 NOTE — ED Notes (Signed)
Contact for discharge transportation: [facility]

## 2019-04-21 NOTE — ED Notes (Signed)
Patient transported to CT 

## 2019-04-21 NOTE — Discharge Instructions (Signed)
Your alcohol level was significantly elevated.  This is now come back down to a more normal range.  You were also slightly dehydrated and had a low potassium.  We have given you potassium, IV fluids and have made sure that there was no problems with recurrent bleeding in your brain.  All of these are reassuring.  Please drink plenty of clear liquids today, rest, seek medical exam for severe or worsening symptoms.

## 2019-04-21 NOTE — ED Notes (Signed)
Care assumed at this time. Patient resting in bed at this time in NAD. Patient is not on a monitor per prior RN due to agitation and patient ripping on leads. Vitals obtained. Patient is arousable with light stimulation and voice. Patient continues to rest with eyes closed between care. Respirations equal and unlabored with symmetric expansion. Will continue to monitor.
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
## 2019-04-21 NOTE — ED Notes (Signed)
Patient just pulled his IV out.
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
## 2019-05-05 ENCOUNTER — Other Ambulatory Visit: Payer: Self-pay | Admitting: Family

## 2019-05-11 MED FILL — HUMALOG 100 UNITS/ML KWIKPE: 100 | 30 days supply | Qty: 15 | Fill #0

## 2019-05-11 MED FILL — LANTUS SOLOSTAR 100 UNITS/M: 100 | 24 days supply | Qty: 6 | Fill #0

## 2019-05-24 ENCOUNTER — Other Ambulatory Visit: Payer: Self-pay | Admitting: Family

## 2019-06-09 ENCOUNTER — Telehealth: Payer: Self-pay | Admitting: Family

## 2019-06-09 ENCOUNTER — Telehealth: Payer: Self-pay

## 2019-06-09 NOTE — Telephone Encounter (Signed)
I was notified by Dorothe Pea pt's fiance had called the office stating pt was in some sort of distress and she had been advised by pt's mother and sister to call our office for help. I went to the front office area to find out more about the situation and the pt's fiance had called again this time speaking with Phillis Knack. I asked Tiffany to place her on hold so I could get to my desk to speak with her.  Upon speaking to her, I could hear the pt in the background and I asked her if she could tell the pt his doctor's office was on the phone and we would like to speak with him so we could offer help to him.  She stated he did not want to come to the phone.  I asked her if she had call 911.  She said she did, but I could not understand her beyond that point.  All she said was pt could not get up from the toilet.  The call was then disconnected by her.  I then went to [facility] to advise her on the situation.  [name] stated she would f/u with the pt.

## 2019-06-09 NOTE — Telephone Encounter (Signed)
Per front desk staff pt was in the background call stating that he "didn't need help."   I called number listed for fiance, went straight to voicemail. Called pt's number- went straight to voicemail.  Called mother who is emergency contact and she stated she is not with the patient right now and does not live near him but he has been having increased issues with speech and now is having trouble moving his leg.   I then called pt's number again advising him that he should call 911 and proceed to the ED.

## 2019-06-09 NOTE — Telephone Encounter (Signed)
Received call from woman stating she is pts fiance. She was slurring her speech during conversation stating that pt is "out of his mind" and that he is diabetic. I asked if pt had checked his blood sugar. She said she did not know but he isn't right. She is not on pt DPR and I asked what specifically is going on with the pt. She said she didn't know but he keeps saying he is not going anywhere. I asked if she would be willing to call 911. She said the pts mother and sister told her to call the office. The she yelled multiple profanities. What the f--- am I supposed to do then. F--- you, you f------ b----. She continued on and I advised her I would have to end the call if she continued. She proceeding swearing at me and I told her I was ending the call before disconnecting.  She called from [phone]

## 2019-06-25 ENCOUNTER — Emergency Department (HOSPITAL_COMMUNITY)
Admission: EM | Admit: 2019-06-25 | Discharge: 2019-06-25 | Disposition: A | Discharge status: Home / Self Care | Payer: No Typology Code available for payment source | Attending: Emergency Medicine | Admitting: Emergency Medicine

## 2019-06-25 ENCOUNTER — Other Ambulatory Visit: Payer: Self-pay

## 2019-06-25 ENCOUNTER — Encounter (HOSPITAL_COMMUNITY): Payer: Self-pay

## 2019-06-25 ENCOUNTER — Emergency Department (HOSPITAL_COMMUNITY): Payer: No Typology Code available for payment source

## 2019-06-25 DIAGNOSIS — Z79899 Other long term (current) drug therapy: Secondary | ICD-10-CM | POA: Insufficient documentation

## 2019-06-25 DIAGNOSIS — I10 Essential (primary) hypertension: Secondary | ICD-10-CM | POA: Insufficient documentation

## 2019-06-25 DIAGNOSIS — Z87891 Personal history of nicotine dependence: Secondary | ICD-10-CM | POA: Insufficient documentation

## 2019-06-25 DIAGNOSIS — E119 Type 2 diabetes mellitus without complications: Secondary | ICD-10-CM | POA: Insufficient documentation

## 2019-06-25 DIAGNOSIS — R569 Unspecified convulsions: Secondary | ICD-10-CM | POA: Insufficient documentation

## 2019-06-25 DIAGNOSIS — Z5320 Procedure and treatment not carried out because of patient's decision for unspecified reasons: Secondary | ICD-10-CM | POA: Insufficient documentation

## 2019-06-25 DIAGNOSIS — F10230 Alcohol dependence with withdrawal, uncomplicated: Secondary | ICD-10-CM | POA: Insufficient documentation

## 2019-06-25 DIAGNOSIS — Z794 Long term (current) use of insulin: Secondary | ICD-10-CM | POA: Insufficient documentation

## 2019-06-25 LAB — BASIC METABOLIC PANEL
Anion gap: 13 (ref 5–15)
BUN: <5 mg/dL — ABNORMAL LOW (ref 6–20)
CO2: 27 mmol/L (ref 22–32)
Calcium: 9.1 mg/dL (ref 8.9–10.3)
Chloride: 93 mmol/L — ABNORMAL LOW (ref 98–111)
Creatinine, Ser: 0.92 mg/dL (ref 0.61–1.24)
GFR calc Af Amer: >60 mL/min (ref >60)
GFR calc non Af Amer: >60 mL/min (ref >60)
Glucose, Bld: 28 mg/dL — CL (ref 70–99)
Potassium: 3.1 mmol/L — ABNORMAL LOW (ref 3.5–5.1)
Sodium: 133 mmol/L — ABNORMAL LOW (ref 135–145)

## 2019-06-25 LAB — CBC
HCT: 35.4 % — ABNORMAL LOW (ref 39.0–52.0)
Hemoglobin: 12.4 g/dL — ABNORMAL LOW (ref 13.0–17.0)
MCH: 33.3 pg (ref 26.0–34.0)
MCHC: 35 g/dL (ref 30.0–36.0)
MCV: 95.2 fL (ref 80.0–100.0)
Platelets: 203 10*3/uL (ref 150–400)
RBC: 3.72 MIL/uL — ABNORMAL LOW (ref 4.22–5.81)
RDW: 13.7 % (ref 11.5–15.5)
WBC: 4.8 10*3/uL (ref 4.0–10.5)
nRBC: 0 % (ref 0.0–0.2)

## 2019-06-25 LAB — RAPID URINE DRUG SCREEN, HOSP PERFORMED
Amphetamines: NOT DETECTED
Barbiturates: NOT DETECTED
Benzodiazepines: POSITIVE — AB
Cocaine: NOT DETECTED
Opiates: NOT DETECTED
Tetrahydrocannabinol: NOT DETECTED

## 2019-06-25 LAB — CBG MONITORING, ED
Glucose-Capillary: 189 mg/dL — ABNORMAL HIGH (ref 70–99)
Glucose-Capillary: 141 mg/dL — ABNORMAL HIGH (ref 70–99)

## 2019-06-25 LAB — URINALYSIS, ROUTINE W REFLEX MICROSCOPIC
Bilirubin Urine: NEGATIVE
Glucose, UA: 150 mg/dL — AB
Hgb urine dipstick: NEGATIVE
Ketones, ur: NEGATIVE mg/dL
Leukocytes,Ua: NEGATIVE
Nitrite: NEGATIVE
Protein, ur: NEGATIVE mg/dL
Specific Gravity, Urine: 1.012 (ref 1.005–1.030)
pH: 6 (ref 5.0–8.0)

## 2019-06-25 LAB — HEPATIC FUNCTION PANEL
ALT: 116 U/L — ABNORMAL HIGH (ref 0–44)
AST: 313 U/L — ABNORMAL HIGH (ref 15–41)
Albumin: 3.3 g/dL — ABNORMAL LOW (ref 3.5–5.0)
Alkaline Phosphatase: 184 U/L — ABNORMAL HIGH (ref 38–126)
Bilirubin, Direct: 0.3 mg/dL — ABNORMAL HIGH (ref 0.0–0.2)
Indirect Bilirubin: 0.9 mg/dL (ref 0.3–0.9)
Total Bilirubin: 1.2 mg/dL (ref 0.3–1.2)
Total Protein: 6.8 g/dL (ref 6.5–8.1)

## 2019-06-25 LAB — SALICYLATE LEVEL: Salicylate Lvl: <7 mg/dL — ABNORMAL LOW (ref 7.0–30.0)

## 2019-06-25 LAB — ETHANOL: Alcohol, Ethyl (B): <10 mg/dL (ref <10)

## 2019-06-25 LAB — ACETAMINOPHEN LEVEL: Acetaminophen (Tylenol), Serum: <10 ug/mL — ABNORMAL LOW (ref 10–30)

## 2019-06-25 LAB — LIPASE, BLOOD: Lipase: 15 U/L (ref 11–51)

## 2019-06-25 MED ORDER — DEXTROSE 50 % IV SOLN: 2.0000 | Freq: Once | INTRAVENOUS | Status: AC

## 2019-06-25 MED ORDER — LORAZEPAM 2 MG/ML IJ SOLN
0.0000 mg | Freq: Four times a day (QID) | INTRAMUSCULAR | Status: DC
  Administered 2019-06-25: 12:00:00 1 mg via INTRAVENOUS
  Filled 2019-06-25: qty 1

## 2019-06-25 MED ORDER — LEVETIRACETAM IN NACL 1000 MG/100ML IV SOLN
1000.0000 mg | Freq: Once | INTRAVENOUS | Status: AC
  Administered 2019-06-25: 17:00:00 1000 mg via INTRAVENOUS
  Filled 2019-06-25: qty 100

## 2019-06-25 MED ORDER — DEXTROSE 50 % IV SOLN
INTRAVENOUS | Status: AC
  Filled 2019-06-25: qty 50

## 2019-06-25 MED ORDER — SODIUM CHLORIDE 0.9 % IV BOLUS
500.0000 mL | Freq: Once | INTRAVENOUS | Status: AC
  Administered 2019-06-25: 12:00:00 500 mL via INTRAVENOUS

## 2019-06-25 MED ORDER — DEXTROSE 50 % IV SOLN
INTRAVENOUS | Status: AC
  Administered 2019-06-25: 12:00:00 100 mL via INTRAVENOUS
  Filled 2019-06-25: qty 100

## 2019-06-25 MED ORDER — LEVETIRACETAM 500 MG PO TABS: 500.0000 mg | ORAL_TABLET | Freq: Two times a day (BID) | ORAL | 0 refills | Status: DC

## 2019-06-25 MED ORDER — THIAMINE HCL 100 MG/ML IJ SOLN
100.0000 mg | Freq: Every day | INTRAMUSCULAR | Status: DC
  Administered 2019-06-25: 12:00:00 100 mg via INTRAVENOUS
  Filled 2019-06-25: qty 2

## 2019-06-25 MED ORDER — POTASSIUM CHLORIDE CRYS ER 20 MEQ PO TBCR
40.0000 meq | EXTENDED_RELEASE_TABLET | Freq: Once | ORAL | Status: AC
  Administered 2019-06-25: 12:00:00 40 meq via ORAL
  Filled 2019-06-25: qty 2
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
## 2019-06-25 NOTE — ED Notes (Signed)
Patient verbalizes understanding of discharge instructions. Opportunity for questioning and answers were provided. Armband removed by staff, pt discharged from ED ambulatory. Pt left AMA.

## 2019-06-25 NOTE — ED Notes (Signed)
Pt stated to this tech, "yeah [place] was [place] the cour[place] with [place] friend."

## 2019-06-25 NOTE — ED Provider Notes (Signed)
[facility] EMERGENCY DEPARTMENT Provider Note   CSN: [id number] Arrival date & time: 06/25/19  1038     History Chief Complaint  Patient presents with  . Seizures    Willie [name] is a 44 y.o. 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 [place] when he had a witnessed seizure.  He was reportedly sitting in his chair and assisted to the ground where he had full body convulsions, loss of bladder control.  According to EMS he was not actively confused and postictal.  Patient denies any recent trauma. He does state that he has not had any alcohol for 2 days.  He does not have a history of prior alcohol withdrawal seizures. Chart review does show that he had a bilateral craniotomy in July 2020 for subdural hematoma with a repeat left-sided craniotomy and November after he had a subdural abscess.  He denies any new medications or changes in medications.  He does report that he has not stopped any medications.    He does report that today when he went to [facility] he initially made his coffee resulting in him having to medium size coffees.    HPI     Past Medical History:  Diagnosis Date  . Alcohol abuse   . Alcoholic pancreatitis    recurrent  . Depression   . Diabetes mellitus, type II ([place])    [place]set 03/2010  . GERD (gastroesophageal reflux disease)   . High cholesterol   . 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 Hypertension   . Sleep apnea    does not wear a CPAP    Patient Active Problem List   Diagnosis Date Noted  . Malnutrition of moderate degree 01/09/2019  . 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 Subdural bleeding ([place]) 11/24/2018  . Abdominal pain  11/22/2018  . SDH (subdural hematoma) ([place]) 11/22/2018  . Abnormal LFTs 11/22/2018  . IDDM (insulin dependent diabetes mellitus) 09/12/2018  . Acute hepatitis 09/11/2018  . Alcohol-induced mood disorder ([place])   . DKA (diabetic ketoacidoses) ([place]) 07/28/2017  . Alcohol withdrawal ([place]) 07/28/2017  . Abnormal CT scan, colon   . Benign neoplasm of descending colon   . Lower GI bleed 07/04/2017  . Transaminitis 07/04/2017  . Pain   . Bleeding internal hemorrhoids   . Anemia of chronic disease   . Left arm weakness   . Anemia, blood loss   . Vomiting alone 03/27/2017  . Chest pain 03/27/2017  . Nausea and vomiting   . Lactic acidosis 03/26/2017  . History of alcohol abuse 06/25/2016  . Tobacco abuse 06/25/2016  . Anxiety state 11/20/2015  . Dental infection 07/10/2015  . Mood disorder ([place]) 04/18/2015  . Eczema 06/27/2014  . GERD (gastroesophageal reflux disease) 05/24/2014  . Snoring 04/07/2013  . Suicide attempt by substance overdose ([place]) 02/10/2013  . Alcohol abuse 12/31/2012  . Erectile dysfunction 12/09/2012  . Hematuria 06/11/2012  . Vision problem 02/02/2012  . Insomnia 12/20/2011  . Chronic pancreatitis ([place]) 02/05/2011  . Depression with anxiety 11/13/2010  . Hyperlipemia 06/27/2010  . Diabetes type 2, uncontrolled ([place]) 05/22/2010  . Essential hypertension 05/22/2010    Past Surgical History:  Procedure Laterality Date  .  COLONOSCOPY W/ BIOPSIES  09/2010   Dr. Ardis Hughs.  for intermittent rectal bleeding.  Mild sigmoid to descending diverticulosis.  Mild left colon erythema, benign biopsy, probably "prep effect"  . COLONOSCOPY WITH PROPOFOL N/A 07/06/2017   Procedure: COLONOSCOPY WITH PROPOFOL;  Surgeon: Jerene Bears, MD;  Location: [facility];  Service: Gastroenterology;  Laterality: N/A;  . CRANIOTOMY Bilateral 11/24/2018   Procedure: Bilateral craniotomy for evacuation of subdural hematoma;  Surgeon: Erline Levine, MD;  Location: [facility];  Service: Neurosurgery;   Laterality: Bilateral;  Bilateral craniotomy for evacuation of subdural hematoma  . CRANIOTOMY Left 01/06/2019   Procedure: CRANIECTOMY;  Surgeon: Earnie Larsson, MD;  Location: [facility];  Service: Neurosurgery;  Laterality: Left;  . LUMBAR MICRODISCECTOMY  ~ 2004   Dr Ellene Route       Family History  Problem Relation Age of Onset  . Diabetes Mother   . Lung cancer Brother        twin brother  . Pancreatic cancer Paternal Aunt   . Colon cancer Neg Hx   . Stomach cancer Neg Hx     Social History   Tobacco Use  . Smoking status: Former Smoker    Packs/day: 0.25    Years: 1.00    Pack years: 0.25    Types: Cigarettes    Start date: 04/29/2016  . Smokeless tobacco: Never Used  . Tobacco comment: 4 Cigarettes a day   Substance Use Topics  . Alcohol use: Yes    Comment: 12 pack beer a day  . Drug use: Not Currently    Comment: none in years    Home Medications Prior to Admission medications   Medication Sig Start Date End Date Taking? Authorizing Provider  amLODipine (NORVASC) 5 MG tablet Take 1 tablet (5 mg total) by mouth daily. 02/15/19   Debbrah Alar, NP  blood glucose meter kit and supplies KIT Dispense based on patient and insurance preference. Use up to four times daily as directed. (FOR ICD-9 250.00, 250.01). 07/24/18   Debbrah Alar, NP  Continuous Blood Gluc Sensor (FREESTYLE LIBRE 14 DAY SENSOR) MISC Inject 1 each into the skin every 14 (fourteen) days. 04/27/18   Debbrah Alar, NP  escitalopram (LEXAPRO) 20 MG tablet TAKE ONE TABLET BY MOUTH DAILY 03/19/19   Debbrah Alar, NP  folic acid (FOLVITE) 1 MG tablet Take 1 tablet (1 mg total) by mouth daily. 02/03/19   Debbrah Alar, NP  glucose blood (FREESTYLE LITE) test strip Use as instructed 07/24/18   Debbrah Alar, NP  HYDROcodone-acetaminophen (NORCO) 5-325 MG tablet Take 1 tablet by mouth every 6 (six) hours as needed for moderate pain. Patient not taking: Reported on 04/21/2019 03/22/19    Saguier, Percell Miller, PA-C  Insulin Glargine (LANTUS SOLOSTAR) 100 UNIT/ML Solostar Pen Inject 25 Units into the skin at bedtime. 05/05/19   Debbrah Alar, NP  insulin lispro (HUMALOG KWIKPEN) 100 UNIT/ML KwikPen INJECT INTO THE SKIN 3 TIMES DAILY JUST BEFORE A MEAL (09-10-08 UNITS) 05/05/19   Debbrah Alar, NP  Insulin Pen Needle (ULTICARE SHORT PEN NEEDLES) 31G X 8 MM MISC Use once daily to inject insulin. 04/27/18   Debbrah Alar, NP  Lancets (FREESTYLE) lancets 2 (two) times daily 02/27/17   Debbrah Alar, NP  levETIRAcetam (KEPPRA) 500 MG tablet Take 1 tablet (500 mg total) by mouth 2 (two) times daily. 06/25/19 07/25/19  Lorin Glass, PA-C  LORazepam (ATIVAN) 1 MG tablet TAKE ONE TABLET BY MOUTH EVERY 8 HOURS AS NEEDED FOR ANXIETY 05/24/19  Debbrah Alar, NP  Multiple Vitamins-Minerals (MULTIVITAMIN) tablet Take 1 tablet by mouth daily. 01/14/19   Erline Levine, MD  ondansetron (ZOFRAN ODT) 4 MG disintegrating tablet 42m ODT q4 hours prn nausea/vomit Patient taking differently: Take 4 mg by mouth every 4 (four) hours as needed for nausea or vomiting.  10/13/17   BMalvin Johns MD  pantoprazole (PROTONIX) 40 MG tablet Take 1 tablet (40 mg total) by mouth 2 (two) times daily. 01/14/19   SErline Levine MD  QUEtiapine (SEROQUEL) 300 MG tablet TAKE ONE TABLET BY MOUTH AT BEDTIME 03/22/19   ODebbrah Alar NP  sildenafil (REVATIO) 20 MG tablet TAKE 1-2 TABLETS BY MOUTH PRIOR TO SEXUAL ACTIVITY 03/22/19   ODebbrah Alar NP    Allergies    Invokamet [canagliflozin-metformin hcl] and Metformin and related  Review of Systems   Review of Systems  Constitutional: Negative for chills and fever.  HENT: Negative for congestion.   Respiratory: Negative for chest tightness and shortness of breath.   Cardiovascular: Negative for chest pain.  Gastrointestinal: Negative for abdominal pain, diarrhea, nausea and vomiting.  Genitourinary: Negative for dysuria.    Musculoskeletal: Negative for back pain and neck pain.  Neurological: Positive for seizures and headaches. Negative for dizziness, tremors and weakness.  Psychiatric/Behavioral: Negative for confusion.  All other systems reviewed and are negative.   Physical Exam Updated Vital Signs BP (!) 131/91   Pulse 98   Temp 98.7 F (37.1 C) (Oral)   Resp 17   Ht 6' (1.829 m)   Wt 72.6 kg   SpO2 100%   BMI 21.70 kg/m   Physical Exam Vitals and nursing note reviewed.  Constitutional:      Appearance: He is well-developed.  HENT:     Head: Normocephalic and atraumatic.  Eyes:     Conjunctiva/sclera: Conjunctivae normal.  Cardiovascular:     Rate and Rhythm: Normal rate and regular rhythm.     Heart sounds: No murmur.  Pulmonary:     Effort: Pulmonary effort is normal. No respiratory distress.     Breath sounds: Normal breath sounds.  Abdominal:     Palpations: Abdomen is soft.     Tenderness: There is no abdominal tenderness.  Musculoskeletal:     Cervical back: Neck supple.  Skin:    General: Skin is warm and dry.     Comments: He has a wound that is triangular shaped on his left superior shoulder that is scabbed and healing.  He has multiple wounds on his scalp on the left side that do not have secondary induration swelling or abnormal redness. He reports that he picks at them and simply has not allowed them to heal.  Neurological:     Mental Status: He is alert.     ED Results / Procedures / Treatments   Labs (all labs ordered are listed, but only abnormal results are displayed) Labs Reviewed  BASIC METABOLIC PANEL - Abnormal; Notable for the following components:      Result Value   Sodium 133 (*)    Potassium 3.1 (*)    Chloride 93 (*)    Glucose, Bld 28 (*)    BUN <5 (*)    All other components within normal limits  CBC - Abnormal; Notable for the following components:   RBC 3.72 (*)    Hemoglobin 12.4 (*)    HCT 35.4 (*)    All other components within normal  limits  RAPID URINE DRUG SCREEN, HOSP PERFORMED - Abnormal; Notable for  the following components:   Benzodiazepines POSITIVE (*)    All other components within normal limits  URINALYSIS, ROUTINE W REFLEX MICROSCOPIC - Abnormal; Notable for the following components:   Glucose, UA 150 (*)    All other components within normal limits  ACETAMINOPHEN LEVEL - Abnormal; Notable for the following components:   Acetaminophen (Tylenol), Serum <10 (*)    All other components within normal limits  SALICYLATE LEVEL - Abnormal; Notable for the following components:   Salicylate Lvl <0.1 (*)    All other components within normal limits  HEPATIC FUNCTION PANEL - Abnormal; Notable for the following components:   Albumin 3.3 (*)    AST 313 (*)    ALT 116 (*)    Alkaline Phosphatase 184 (*)    Bilirubin, Direct 0.3 (*)    All other components within normal limits  CBG MONITORING, ED - Abnormal; Notable for the following components:   Glucose-Capillary 189 (*)    All other components within normal limits  CBG MONITORING, ED - Abnormal; Notable for the following components:   Glucose-Capillary 141 (*)    All other components within normal limits  ETHANOL  LIPASE, BLOOD    EKG EKG Interpretation  Date/Time:  Friday June 25 2019 10:49:28 EST Ventricular Rate:  100 PR Interval:    QRS Duration: 83 QT Interval:  355 QTC Calculation: 458 R Axis:   42 Text Interpretation: Sinus tachycardia Interpretation limited secondary to artifact No significant change was found Confirmed by Ezequiel Essex [phone]) on 06/25/2019 11:12:19 AM   Radiology CT Head Wo Contrast  Result Date: 06/25/2019 CLINICAL DATA:  Seizure EXAM: CT HEAD WITHOUT CONTRAST TECHNIQUE: Contiguous axial images were obtained from the base of the skull through the vertex without intravenous contrast. COMPARISON:  CT head 04/21/2019 FINDINGS: Brain: Chronic dural thickening in the left frontal region with small chronic extra-axial  fluid collection is unchanged. No acute hemorrhage or mass. Ventricle size normal. No acute infarct. Vascular: Negative for hyperdense vessel Skull: Chronic bilateral craniotomy. Sinuses/Orbits: Negative Other: None IMPRESSION: No acute abnormality no interval change. Chronic dural thickening on the left is stable. Electronically Signed   By: Franchot Gallo M.D.   On: 06/25/2019 12:03    Procedures Procedures (including critical care time)  Medications Ordered in ED Medications  LORazepam (ATIVAN) injection 0-4 mg (1 mg Intravenous Given 06/25/19 1137)  thiamine (B-1) injection 100 mg (100 mg Intravenous Given 06/25/19 1137)  dextrose 50 % solution 100 mL (100 mLs Intravenous Given 06/25/19 1216)  potassium chloride SA (KLOR-CON) CR tablet 40 mEq (40 mEq Oral Given 06/25/19 1219)  sodium chloride 0.9 % bolus 500 mL (0 mLs Intravenous Stopped 06/25/19 1255)  levETIRAcetam (KEPPRA) IVPB 1000 mg/100 mL premix (0 mg Intravenous Stopped 06/25/19 1705)    ED Course  I have reviewed the triage vital signs and the nursing notes.  Pertinent labs & imaging results that were available during my care of the patient were reviewed by me and considered in my medical decision making (see chart for details).  Clinical Course as of Jun 24 2056  Fri Jun 25, 2019  1210 Alcohol, Ethyl (B): <10 [EH]  1214 2 Amps d50 ordered, was reportedly normal with EMS  Glucose(!!): 28 [EH]  1215 Called lab regarding hepatic function panel as it has been listed is in process for 3 hours.  There are going to follow-up on it.  Hepatic function panel [EH]  1218 Kdur ordered  Potassium(!): 3.1 [EH]  1241 After d50.  Glucose-Capillary(!): 189 [EH]    Clinical Course User Index [EH] Ollen Gross   MDM Rules/Calculators/A&P                     Patient is a 44 year old man who presents today for evaluation of witnessed seizure-like activity. He reportedly had a full body seizure and was postictal after. Chart  review shows that he used to be on Keppra and has had multiple brain surgeries within the past year from subdural hematomas/hemorrhage and subdural abscesses. On my evaluation he is awake and alert.  With EMS his blood sugar was recorded in the 70s.  He does drink alcohol according to chart review.  CT head was obtained without evidence of subdural hematoma, abscess, or other acute abnormalities. Salicylate, acetaminophen levels are undetectable.  Ethanol is undetected.  BMP shows hypokalemia with a potassium of 3.1, p.o. potassium replacement was ordered.  BMP significant for glucose of 28.  Given that his sugar was in the 70s with EMS I do not suspect that this is the cause of his seizures.  He was treated with IV D50 and allowed to eat after which his blood sugar normalized.  Suspicion for alcohol withdrawal seizures.  He was given Ativan for a CIWA of 5.  Hepatic function panel appears consistent with his baseline with transaminitis.  He does not have a significant leukocytosis.  UDS is positive for benzodiazepines, chart review shows he has a prescription for these and he denies stopping taking any. Given that this is concern for alcohol withdrawal seizure I recommended admission. Patient refused this.  We discussed that leaving the hospital would be [place].  He did not have any family members present to help convince him to stay.  We discussed risks of this decision including but not limited to recurrent seizure, aspiration, death, disability that may be severe.  He is advised not to drive. It appears that after his multiple brain surgeries he was on Keppra.  He is Keppra loaded and given a prescription for continued Keppra at home. I did discuss Librium taper with him, however he plans to continue drinking after discharge therefore I do not think a Librium taper would be safe for him.  We discussed the importance of following up with his outpatient team to discuss the continued use  of Keppra.  His Keppra was restarted at at 50 mg twice daily.  He was observed in the emergency room for 6-1/2 hours without repeat seizure.  Patient left AGAINST MEDICAL ADVICE.  He was able to repeat to me the risks that we discussed of this decision and states his understanding.  Note: Portions of this report may have been transcribed using voice recognition software. Every effort was made to ensure accuracy; however, inadvertent computerized transcription errors may be present  Final Clinical Impression(s) / ED Diagnoses Final diagnoses:  Seizure-like activity ([place])  Alcohol withdrawal seizure without complication [facility])    Rx / DC Orders ED Discharge Orders         Ordered    levETIRAcetam (KEPPRA) 500 MG tablet  2 times daily     06/25/19 1652           Ollen Gross 06/25/19 2104    Ezequiel Essex, MD 06/26/19 1002

## 2019-06-25 NOTE — Discharge Instructions (Addendum)
Today you had what sounds like a seizure. This is most likely a combination of your previous brain injuries/surgeries and alcohol withdrawal. You were given keppra today and re-started on keppra for at home.   You may not drive, operate heavy machinery, work on heights, hold children or perform any other task that would cause harm to you or anyone else if you were to have a seizure.  Please make sure you are not swimming. Please do not bathe in the tub.  You may take showers however please make sure someone is home and around in case you are to have a seizure.  While in the emergency room your blood sugar was low, however as your blood sugar was normal when you are in the ambulance this does not appear to be the cause of your seizure today.  Today I recommended admission given your multiple risk factors for recurrent seizures which you refused.  You are leaving [place].  As you stated you will be continuing to drink alcohol you were not given a librium taper.  Suddenly stopping alcohol can be life-threatening.  Also stopping benzodiazepines such as xanax can cause life threatening seizures.   We discussed risks of this and you are able to repeat those risks back to me, including the risk of disability or death.   If you have recurrent seizures, develop any new or concerning symptoms

## 2019-06-25 NOTE — ED Notes (Signed)
Pt states that he will receive Keppra loading dose IV and wants to leave AMA. PA aware of situation.

## 2019-06-28 ENCOUNTER — Telehealth: Payer: Self-pay | Admitting: Family

## 2019-06-28 NOTE — Telephone Encounter (Signed)
Please contact pt to schedule an ED follow up visit with me in person.

## 2019-06-29 NOTE — Telephone Encounter (Signed)
Called pt left msg  °

## 2019-07-03 ENCOUNTER — Other Ambulatory Visit: Payer: Self-pay | Admitting: Family

## 2019-07-06 ENCOUNTER — Emergency Department (HOSPITAL_COMMUNITY): Payer: No Typology Code available for payment source

## 2019-07-06 ENCOUNTER — Inpatient Hospital Stay (HOSPITAL_COMMUNITY)
Admission: EM | Admit: 2019-07-06 | Discharge: 2019-07-08 | DRG: 896 | Disposition: A | Discharge status: Home / Self Care | Payer: No Typology Code available for payment source | Attending: Student | Admitting: Student

## 2019-07-06 ENCOUNTER — Observation Stay (HOSPITAL_COMMUNITY): Payer: No Typology Code available for payment source

## 2019-07-06 ENCOUNTER — Encounter (HOSPITAL_COMMUNITY): Payer: Self-pay | Admitting: Internal Medicine

## 2019-07-06 DIAGNOSIS — Z833 Family history of diabetes mellitus: Secondary | ICD-10-CM

## 2019-07-06 DIAGNOSIS — F1010 Alcohol abuse, uncomplicated: Secondary | ICD-10-CM | POA: Diagnosis not present

## 2019-07-06 DIAGNOSIS — R569 Unspecified convulsions: Secondary | ICD-10-CM | POA: Diagnosis not present

## 2019-07-06 DIAGNOSIS — E1110 Type 2 diabetes mellitus with ketoacidosis without coma: Secondary | ICD-10-CM | POA: Diagnosis not present

## 2019-07-06 DIAGNOSIS — R32 Unspecified urinary incontinence: Secondary | ICD-10-CM | POA: Diagnosis present

## 2019-07-06 DIAGNOSIS — F10939 Alcohol use, unspecified with withdrawal, unspecified: Secondary | ICD-10-CM

## 2019-07-06 DIAGNOSIS — F10239 Alcohol dependence with withdrawal, unspecified: Secondary | ICD-10-CM | POA: Diagnosis not present

## 2019-07-06 DIAGNOSIS — R000 Tachycardia, unspecified: Secondary | ICD-10-CM | POA: Diagnosis present

## 2019-07-06 DIAGNOSIS — I10 Essential (primary) hypertension: Secondary | ICD-10-CM | POA: Diagnosis present

## 2019-07-06 DIAGNOSIS — F17210 Nicotine dependence, cigarettes, uncomplicated: Secondary | ICD-10-CM | POA: Diagnosis present

## 2019-07-06 DIAGNOSIS — G4733 Obstructive sleep apnea (adult) (pediatric): Secondary | ICD-10-CM | POA: Diagnosis present

## 2019-07-06 DIAGNOSIS — Y906 Blood alcohol level of 120-199 mg/100 ml: Secondary | ICD-10-CM | POA: Diagnosis present

## 2019-07-06 DIAGNOSIS — E7800 Pure hypercholesterolemia, unspecified: Secondary | ICD-10-CM | POA: Diagnosis present

## 2019-07-06 DIAGNOSIS — Z801 Family history of malignant neoplasm of trachea, bronchus and lung: Secondary | ICD-10-CM

## 2019-07-06 DIAGNOSIS — Z794 Long term (current) use of insulin: Secondary | ICD-10-CM

## 2019-07-06 DIAGNOSIS — E872 Acidosis, unspecified: Secondary | ICD-10-CM | POA: Diagnosis present

## 2019-07-06 DIAGNOSIS — Z23 Encounter for immunization: Secondary | ICD-10-CM

## 2019-07-06 DIAGNOSIS — F329 Major depressive disorder, single episode, unspecified: Secondary | ICD-10-CM | POA: Diagnosis present

## 2019-07-06 DIAGNOSIS — K859 Acute pancreatitis without necrosis or infection, unspecified: Secondary | ICD-10-CM | POA: Diagnosis present

## 2019-07-06 DIAGNOSIS — E11649 Type 2 diabetes mellitus with hypoglycemia without coma: Secondary | ICD-10-CM | POA: Diagnosis not present

## 2019-07-06 DIAGNOSIS — F10920 Alcohol use, unspecified with intoxication, uncomplicated: Secondary | ICD-10-CM

## 2019-07-06 DIAGNOSIS — S065X9S Traumatic subdural hemorrhage with loss of consciousness of unspecified duration, sequela: Secondary | ICD-10-CM

## 2019-07-06 DIAGNOSIS — I6203 Nontraumatic chronic subdural hemorrhage: Secondary | ICD-10-CM | POA: Diagnosis present

## 2019-07-06 DIAGNOSIS — Z800 Family history of malignant neoplasm of digestive organs: Secondary | ICD-10-CM

## 2019-07-06 DIAGNOSIS — G4089 Other seizures: Secondary | ICD-10-CM | POA: Diagnosis present

## 2019-07-06 DIAGNOSIS — F10229 Alcohol dependence with intoxication, unspecified: Secondary | ICD-10-CM | POA: Diagnosis present

## 2019-07-06 DIAGNOSIS — Z20822 Contact with and (suspected) exposure to covid-19: Secondary | ICD-10-CM | POA: Diagnosis present

## 2019-07-06 DIAGNOSIS — K219 Gastro-esophageal reflux disease without esophagitis: Secondary | ICD-10-CM | POA: Diagnosis present

## 2019-07-06 DIAGNOSIS — Z79899 Other long term (current) drug therapy: Secondary | ICD-10-CM

## 2019-07-06 DIAGNOSIS — E876 Hypokalemia: Secondary | ICD-10-CM | POA: Diagnosis present

## 2019-07-06 DIAGNOSIS — E785 Hyperlipidemia, unspecified: Secondary | ICD-10-CM | POA: Diagnosis present

## 2019-07-06 DIAGNOSIS — Z9114 Patient's other noncompliance with medication regimen: Secondary | ICD-10-CM

## 2019-07-06 DIAGNOSIS — S065XAA Traumatic subdural hemorrhage with loss of consciousness status unknown, initial encounter: Secondary | ICD-10-CM | POA: Diagnosis present

## 2019-07-06 DIAGNOSIS — G9349 Other encephalopathy: Secondary | ICD-10-CM | POA: Diagnosis present

## 2019-07-06 DIAGNOSIS — S065X9A Traumatic subdural hemorrhage with loss of consciousness of unspecified duration, initial encounter: Secondary | ICD-10-CM

## 2019-07-06 DIAGNOSIS — F1024 Alcohol dependence with alcohol-induced mood disorder: Secondary | ICD-10-CM | POA: Diagnosis present

## 2019-07-06 DIAGNOSIS — Z888 Allergy status to other drugs, medicaments and biological substances status: Secondary | ICD-10-CM

## 2019-07-06 DIAGNOSIS — E1165 Type 2 diabetes mellitus with hyperglycemia: Secondary | ICD-10-CM

## 2019-07-06 HISTORY — DX: Unspecified convulsions: R56.9

## 2019-07-06 LAB — URINALYSIS, ROUTINE W REFLEX MICROSCOPIC
Bacteria, UA: NONE SEEN
Bilirubin Urine: NEGATIVE
Glucose, UA: >=500 mg/dL — AB
Hgb urine dipstick: NEGATIVE
Ketones, ur: NEGATIVE mg/dL
Leukocytes,Ua: NEGATIVE
Nitrite: NEGATIVE
Protein, ur: NEGATIVE mg/dL
Specific Gravity, Urine: 1.025 (ref 1.005–1.030)
pH: 5 (ref 5.0–8.0)

## 2019-07-06 LAB — COMPREHENSIVE METABOLIC PANEL
ALT: 69 U/L — ABNORMAL HIGH (ref 0–44)
AST: 111 U/L — ABNORMAL HIGH (ref 15–41)
Albumin: 3.7 g/dL (ref 3.5–5.0)
Alkaline Phosphatase: 250 U/L — ABNORMAL HIGH (ref 38–126)
Anion gap: 33 — ABNORMAL HIGH (ref 5–15)
BUN: 5 mg/dL — ABNORMAL LOW (ref 6–20)
CO2: 13 mmol/L — ABNORMAL LOW (ref 22–32)
Calcium: 9.2 mg/dL (ref 8.9–10.3)
Chloride: 90 mmol/L — ABNORMAL LOW (ref 98–111)
Creatinine, Ser: 1.1 mg/dL (ref 0.61–1.24)
GFR calc Af Amer: >60 mL/min (ref >60)
GFR calc non Af Amer: >60 mL/min (ref >60)
Glucose, Bld: 603 mg/dL (ref 70–99)
Potassium: 3.7 mmol/L (ref 3.5–5.1)
Sodium: 136 mmol/L (ref 135–145)
Total Bilirubin: 0.9 mg/dL (ref 0.3–1.2)
Total Protein: 7.6 g/dL (ref 6.5–8.1)

## 2019-07-06 LAB — BASIC METABOLIC PANEL
Anion gap: 22 — ABNORMAL HIGH (ref 5–15)
Anion gap: 15 (ref 5–15)
BUN: 5 mg/dL — ABNORMAL LOW (ref 6–20)
BUN: <5 mg/dL — ABNORMAL LOW (ref 6–20)
CO2: 22 mmol/L (ref 22–32)
CO2: 23 mmol/L (ref 22–32)
Calcium: 9.2 mg/dL (ref 8.9–10.3)
Calcium: 8.1 mg/dL — ABNORMAL LOW (ref 8.9–10.3)
Chloride: 101 mmol/L (ref 98–111)
Chloride: 100 mmol/L (ref 98–111)
Creatinine, Ser: 0.9 mg/dL (ref 0.61–1.24)
Creatinine, Ser: 0.73 mg/dL (ref 0.61–1.24)
GFR calc Af Amer: >60 mL/min (ref >60)
GFR calc Af Amer: >60 mL/min (ref >60)
GFR calc non Af Amer: >60 mL/min (ref >60)
GFR calc non Af Amer: >60 mL/min (ref >60)
Glucose, Bld: 120 mg/dL — ABNORMAL HIGH (ref 70–99)
Glucose, Bld: 190 mg/dL — ABNORMAL HIGH (ref 70–99)
Potassium: 5.6 mmol/L — ABNORMAL HIGH (ref 3.5–5.1)
Potassium: 4.2 mmol/L (ref 3.5–5.1)
Sodium: 145 mmol/L (ref 135–145)
Sodium: 138 mmol/L (ref 135–145)

## 2019-07-06 LAB — LACTIC ACID, PLASMA
Lactic Acid, Venous: >11 mmol/L (ref 0.5–1.9)
Lactic Acid, Venous: >11 mmol/L (ref 0.5–1.9)
Lactic Acid, Venous: 4.5 mmol/L (ref 0.5–1.9)

## 2019-07-06 LAB — RAPID URINE DRUG SCREEN, HOSP PERFORMED
Amphetamines: NOT DETECTED
Barbiturates: NOT DETECTED
Benzodiazepines: POSITIVE — AB
Cocaine: NOT DETECTED
Opiates: NOT DETECTED
Tetrahydrocannabinol: NOT DETECTED

## 2019-07-06 LAB — POCT I-STAT EG7
Acid-base deficit: 12 mmol/L — ABNORMAL HIGH (ref 0.0–2.0)
Bicarbonate: 13.8 mmol/L — ABNORMAL LOW (ref 20.0–28.0)
Calcium, Ion: 1.03 mmol/L — ABNORMAL LOW (ref 1.15–1.40)
HCT: 45 % (ref 39.0–52.0)
Hemoglobin: 15.3 g/dL (ref 13.0–17.0)
O2 Saturation: 100 %
Potassium: 3.7 mmol/L (ref 3.5–5.1)
Sodium: 134 mmol/L — ABNORMAL LOW (ref 135–145)
TCO2: 15 mmol/L — ABNORMAL LOW (ref 22–32)
pCO2, Ven: 30.5 mmHg — ABNORMAL LOW (ref 44.0–60.0)
pH, Ven: 7.265 (ref 7.250–7.430)
pO2, Ven: 206 mmHg — ABNORMAL HIGH (ref 32.0–45.0)

## 2019-07-06 LAB — CBC WITH DIFFERENTIAL/PLATELET
Abs Immature Granulocytes: 0.04 10*3/uL (ref 0.00–0.07)
Basophils Absolute: 0.1 10*3/uL (ref 0.0–0.1)
Basophils Relative: 1 %
Eosinophils Absolute: 0 10*3/uL (ref 0.0–0.5)
Eosinophils Relative: 0 %
HCT: 41.8 % (ref 39.0–52.0)
Hemoglobin: 14.1 g/dL (ref 13.0–17.0)
Immature Granulocytes: 0 %
Lymphocytes Relative: 4 %
Lymphs Abs: 0.4 10*3/uL — ABNORMAL LOW (ref 0.7–4.0)
MCH: 32.7 pg (ref 26.0–34.0)
MCHC: 33.7 g/dL (ref 30.0–36.0)
MCV: 97 fL (ref 80.0–100.0)
Monocytes Absolute: 0.3 10*3/uL (ref 0.1–1.0)
Monocytes Relative: 3 %
Neutro Abs: 9.2 10*3/uL — ABNORMAL HIGH (ref 1.7–7.7)
Neutrophils Relative %: 92 %
Platelets: 337 10*3/uL (ref 150–400)
RBC: 4.31 MIL/uL (ref 4.22–5.81)
RDW: 12.6 % (ref 11.5–15.5)
WBC: 10 10*3/uL (ref 4.0–10.5)
nRBC: 0 % (ref 0.0–0.2)

## 2019-07-06 LAB — CBG MONITORING, ED
Glucose-Capillary: 589 mg/dL (ref 70–99)
Glucose-Capillary: 421 mg/dL — ABNORMAL HIGH (ref 70–99)
Glucose-Capillary: 135 mg/dL — ABNORMAL HIGH (ref 70–99)
Glucose-Capillary: 107 mg/dL — ABNORMAL HIGH (ref 70–99)
Glucose-Capillary: 118 mg/dL — ABNORMAL HIGH (ref 70–99)
Glucose-Capillary: 190 mg/dL — ABNORMAL HIGH (ref 70–99)
Glucose-Capillary: 184 mg/dL — ABNORMAL HIGH (ref 70–99)
Glucose-Capillary: 68 mg/dL — ABNORMAL LOW (ref 70–99)
Glucose-Capillary: 78 mg/dL (ref 70–99)
Glucose-Capillary: 68 mg/dL — ABNORMAL LOW (ref 70–99)
Glucose-Capillary: 110 mg/dL — ABNORMAL HIGH (ref 70–99)

## 2019-07-06 LAB — BETA-HYDROXYBUTYRIC ACID
Beta-Hydroxybutyric Acid: 0.22 mmol/L (ref 0.05–0.27)
Beta-Hydroxybutyric Acid: 0.18 mmol/L (ref 0.05–0.27)

## 2019-07-06 LAB — ACETAMINOPHEN LEVEL: Acetaminophen (Tylenol), Serum: <10 ug/mL — ABNORMAL LOW (ref 10–30)

## 2019-07-06 LAB — ETHANOL: Alcohol, Ethyl (B): 143 mg/dL — ABNORMAL HIGH (ref <10)

## 2019-07-06 LAB — GLUCOSE, CAPILLARY: Glucose-Capillary: 130 mg/dL — ABNORMAL HIGH (ref 70–99)

## 2019-07-06 LAB — SALICYLATE LEVEL: Salicylate Lvl: <7 mg/dL — ABNORMAL LOW (ref 7.0–30.0)

## 2019-07-06 LAB — HEMOGLOBIN A1C
Hgb A1c MFr Bld: 8.9 % — ABNORMAL HIGH (ref 4.8–5.6)
Mean Plasma Glucose: 208.73 mg/dL

## 2019-07-06 LAB — SARS CORONAVIRUS 2 (TAT 6-24 HRS): SARS Coronavirus 2: NEGATIVE

## 2019-07-06 MED ORDER — POLYETHYLENE GLYCOL 3350 17 G PO PACK: 17.0000 g | PACK | Freq: Every day | ORAL | Status: DC | PRN

## 2019-07-06 MED ORDER — ESCITALOPRAM OXALATE 10 MG PO TABS
20.0000 mg | ORAL_TABLET | Freq: Every day | ORAL | Status: DC
  Administered 2019-07-06 – 2019-07-08 (×3): 20 mg via ORAL
  Filled 2019-07-06 (×3): qty 2

## 2019-07-06 MED ORDER — THIAMINE HCL 100 MG/ML IJ SOLN
100.0000 mg | Freq: Every day | INTRAMUSCULAR | Status: DC
  Administered 2019-07-06: 100 mg via INTRAVENOUS
  Filled 2019-07-06: qty 2

## 2019-07-06 MED ORDER — THIAMINE HCL 100 MG/ML IJ SOLN
Freq: Once | INTRAVENOUS | Status: DC
  Filled 2019-07-06: qty 1000

## 2019-07-06 MED ORDER — DEXTROSE-NACL 5-0.45 % IV SOLN
INTRAVENOUS | Status: DC
  Administered 2019-07-06: 1000 mL via INTRAVENOUS

## 2019-07-06 MED ORDER — POTASSIUM CHLORIDE 10 MEQ/100ML IV SOLN
10.0000 meq | INTRAVENOUS | Status: AC
  Administered 2019-07-06: 10 meq via INTRAVENOUS
  Filled 2019-07-06 (×2): qty 100

## 2019-07-06 MED ORDER — THIAMINE HCL 100 MG PO TABS
100.0000 mg | ORAL_TABLET | Freq: Every day | ORAL | Status: DC
  Administered 2019-07-07 – 2019-07-08 (×2): 100 mg via ORAL
  Filled 2019-07-06 (×2): qty 1

## 2019-07-06 MED ORDER — INSULIN REGULAR(HUMAN) IN NACL 100-0.9 UT/100ML-% IV SOLN
INTRAVENOUS | Status: DC
  Administered 2019-07-06: 15 [IU]/h via INTRAVENOUS
  Filled 2019-07-06: qty 100

## 2019-07-06 MED ORDER — LORAZEPAM 1 MG PO TABS: 0.0000 mg | ORAL_TABLET | Freq: Four times a day (QID) | ORAL | Status: DC

## 2019-07-06 MED ORDER — INSULIN GLARGINE 100 UNIT/ML ~~LOC~~ SOLN
28.0000 [IU] | Freq: Every day | SUBCUTANEOUS | Status: DC
  Administered 2019-07-06: 28 [IU] via SUBCUTANEOUS
  Filled 2019-07-06 (×2): qty 0.28

## 2019-07-06 MED ORDER — LORAZEPAM 1 MG PO TABS: 0.0000 mg | ORAL_TABLET | Freq: Two times a day (BID) | ORAL | Status: DC

## 2019-07-06 MED ORDER — SODIUM CHLORIDE 0.9 % IV BOLUS
1000.0000 mL | Freq: Once | INTRAVENOUS | Status: AC
  Administered 2019-07-06: 1000 mL via INTRAVENOUS

## 2019-07-06 MED ORDER — INSULIN ASPART 100 UNIT/ML ~~LOC~~ SOLN: 0.0000 [IU] | Freq: Three times a day (TID) | SUBCUTANEOUS | Status: DC

## 2019-07-06 MED ORDER — FOLIC ACID 1 MG PO TABS
1.0000 mg | ORAL_TABLET | Freq: Every day | ORAL | Status: DC
  Administered 2019-07-06 – 2019-07-08 (×3): 1 mg via ORAL
  Filled 2019-07-06 (×3): qty 1

## 2019-07-06 MED ORDER — ONDANSETRON HCL 4 MG/2ML IJ SOLN
4.0000 mg | Freq: Four times a day (QID) | INTRAMUSCULAR | Status: DC | PRN
  Administered 2019-07-06: 4 mg via INTRAVENOUS
  Filled 2019-07-06: qty 2

## 2019-07-06 MED ORDER — INSULIN REGULAR(HUMAN) IN NACL 100-0.9 UT/100ML-% IV SOLN
INTRAVENOUS | Status: DC
  Administered 2019-07-06: 8 [IU]/h via INTRAVENOUS

## 2019-07-06 MED ORDER — QUETIAPINE FUMARATE 50 MG PO TABS
300.0000 mg | ORAL_TABLET | Freq: Every day | ORAL | Status: DC
  Administered 2019-07-06 – 2019-07-07 (×2): 300 mg via ORAL
  Filled 2019-07-06 (×2): qty 6
  Filled 2019-07-06: qty 1

## 2019-07-06 MED ORDER — ACETAMINOPHEN 325 MG PO TABS: 650.0000 mg | ORAL_TABLET | ORAL | Status: DC | PRN

## 2019-07-06 MED ORDER — LACTATED RINGERS IV BOLUS
1000.0000 mL | Freq: Once | INTRAVENOUS | Status: AC
  Administered 2019-07-06: 1000 mL via INTRAVENOUS

## 2019-07-06 MED ORDER — THIAMINE HCL 100 MG/ML IJ SOLN: 100.0000 mg | Freq: Every day | INTRAMUSCULAR | Status: DC

## 2019-07-06 MED ORDER — DEXTROSE 50 % IV SOLN: 0.0000 mL | INTRAVENOUS | Status: DC | PRN

## 2019-07-06 MED ORDER — POTASSIUM CHLORIDE 10 MEQ/100ML IV SOLN
10.0000 meq | INTRAVENOUS | Status: AC
  Administered 2019-07-06 (×2): 10 meq via INTRAVENOUS
  Filled 2019-07-06 (×2): qty 100

## 2019-07-06 MED ORDER — PANTOPRAZOLE SODIUM 40 MG PO TBEC
40.0000 mg | DELAYED_RELEASE_TABLET | Freq: Two times a day (BID) | ORAL | Status: DC
  Administered 2019-07-06 – 2019-07-08 (×5): 40 mg via ORAL
  Filled 2019-07-06 (×5): qty 1

## 2019-07-06 MED ORDER — LORAZEPAM 1 MG PO TABS
1.0000 mg | ORAL_TABLET | ORAL | Status: DC | PRN
  Administered 2019-07-08: 2 mg via ORAL
  Filled 2019-07-06: qty 2

## 2019-07-06 MED ORDER — LORAZEPAM 2 MG/ML IJ SOLN: 0.0000 mg | Freq: Two times a day (BID) | INTRAMUSCULAR | Status: DC

## 2019-07-06 MED ORDER — LEVETIRACETAM IN NACL 1000 MG/100ML IV SOLN
1000.0000 mg | Freq: Once | INTRAVENOUS | Status: AC
  Administered 2019-07-06: 1000 mg via INTRAVENOUS
  Filled 2019-07-06: qty 100

## 2019-07-06 MED ORDER — AMLODIPINE BESYLATE 5 MG PO TABS
5.0000 mg | ORAL_TABLET | Freq: Every day | ORAL | Status: DC
  Administered 2019-07-06 – 2019-07-07 (×2): 5 mg via ORAL
  Filled 2019-07-06 (×2): qty 1

## 2019-07-06 MED ORDER — ONDANSETRON HCL 4 MG PO TABS: 4.0000 mg | ORAL_TABLET | Freq: Four times a day (QID) | ORAL | Status: DC | PRN

## 2019-07-06 MED ORDER — ADULT MULTIVITAMIN W/MINERALS CH
1.0000 | ORAL_TABLET | Freq: Every day | ORAL | Status: DC
  Administered 2019-07-06 – 2019-07-08 (×3): 1 via ORAL
  Filled 2019-07-06 (×3): qty 1

## 2019-07-06 MED ORDER — SODIUM CHLORIDE 0.9 % IV SOLN: INTRAVENOUS | Status: DC

## 2019-07-06 MED ORDER — INSULIN ASPART 100 UNIT/ML ~~LOC~~ SOLN: 0.0000 [IU] | Freq: Every day | SUBCUTANEOUS | Status: DC

## 2019-07-06 MED ORDER — LORAZEPAM 2 MG/ML IJ SOLN
1.0000 mg | INTRAMUSCULAR | Status: DC | PRN
  Administered 2019-07-07: 2 mg via INTRAVENOUS

## 2019-07-06 MED ORDER — LORAZEPAM 2 MG/ML IJ SOLN
1.0000 mg | INTRAMUSCULAR | Status: DC | PRN
  Administered 2019-07-06 – 2019-07-07 (×2): 2 mg via INTRAVENOUS
  Administered 2019-07-07: 1 mg via INTRAVENOUS
  Filled 2019-07-06 (×4): qty 1

## 2019-07-06 MED ORDER — LORAZEPAM 2 MG/ML IJ SOLN: 0.0000 mg | Freq: Four times a day (QID) | INTRAMUSCULAR | Status: DC

## 2019-07-06 MED ORDER — INSULIN ASPART 100 UNIT/ML ~~LOC~~ SOLN
10.0000 [IU] | Freq: Once | SUBCUTANEOUS | Status: AC
  Administered 2019-07-06: 10 [IU] via INTRAVENOUS

## 2019-07-06 MED ORDER — THIAMINE HCL 100 MG PO TABS: 100.0000 mg | ORAL_TABLET | Freq: Every day | ORAL | Status: DC

## 2019-07-06 MED ORDER — SODIUM CHLORIDE 0.9 % IV BOLUS
1000.0000 mL | INTRAVENOUS | Status: AC
  Administered 2019-07-06 (×2): 1000 mL via INTRAVENOUS

## 2019-07-06 MED ORDER — LORAZEPAM 2 MG/ML IJ SOLN
INTRAMUSCULAR | Status: AC
  Filled 2019-07-06: qty 1

## 2019-07-06 MED ORDER — ACETAMINOPHEN 650 MG RE SUPP: 650.0000 mg | RECTAL | Status: DC | PRN

## 2019-07-06 MED ORDER — DEXTROSE-NACL 5-0.45 % IV SOLN: INTRAVENOUS | Status: DC

## 2019-07-06 MED ORDER — SODIUM CHLORIDE 0.9 % IV SOLN
INTRAVENOUS | Status: DC
  Administered 2019-07-06: 1000 mL via INTRAVENOUS

## 2019-07-06 MED ORDER — ENOXAPARIN SODIUM 40 MG/0.4ML ~~LOC~~ SOLN
40.0000 mg | SUBCUTANEOUS | Status: DC
  Administered 2019-07-06 – 2019-07-07 (×2): 40 mg via SUBCUTANEOUS
  Filled 2019-07-06 (×2): qty 0.4

## 2019-07-06 MED ORDER — LEVETIRACETAM 500 MG PO TABS
500.0000 mg | ORAL_TABLET | Freq: Two times a day (BID) | ORAL | Status: DC
  Administered 2019-07-06 – 2019-07-08 (×4): 500 mg via ORAL
  Filled 2019-07-06 (×4): qty 1

## 2019-07-06 MED ORDER — DOCUSATE SODIUM 100 MG PO CAPS
100.0000 mg | ORAL_CAPSULE | Freq: Two times a day (BID) | ORAL | Status: DC
  Administered 2019-07-06 – 2019-07-07 (×4): 100 mg via ORAL
  Filled 2019-07-06 (×4): qty 1

## 2019-07-06 MED ORDER — DIAZEPAM 5 MG PO TABS
5.0000 mg | ORAL_TABLET | Freq: Four times a day (QID) | ORAL | Status: DC
  Administered 2019-07-06 – 2019-07-08 (×8): 5 mg via ORAL
  Filled 2019-07-06 (×8): qty 1

## 2019-07-06 MED ORDER — DEXTROSE 50 % IV SOLN
0.0000 mL | INTRAVENOUS | Status: DC | PRN
  Administered 2019-07-07: 50 mL via INTRAVENOUS
  Filled 2019-07-06: qty 50

## 2019-07-06 NOTE — H&P (Signed)
History and Physical    Willie [name] NID:[id number] DOB: January 22, 1976 DOA: 07/06/2019  PCP: Debbrah Alar, NP Consultants:  [facility] - neurosurgery Patient coming from:  Home - lives with girlfriend, Willie [name]; Willie [name]: [place], [phone]  Chief Complaint:  Seizure  HPI: Willie [name] is a 44 y.o. 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  He reports that he fell on the floor 2 days ago.  He was drinking at the time.  He reports that he has been asleep since then.  He last took his insulin yesterday AM.  He has been eating and drinking.  He thinks he bit his tongue and had urinary incontinence.   His last drink was 2 days ago.   ED Course:   Seizure at home x 5 min; repeat with EMS and given Versed.  Post-ictal but able to answer questions.  Had another seizure, given Ativan.  Glucose 589, appears to be in DKA with pH 7.265, HCO3 14, gap 33.  K+ 3.3.  Normal beta-hydroxybutyrate.  Lactate >11, unlikely sepsis, likely due to seizures.  Loaded with Keppra.  Awake, alert, taking now.  ETOH 143.  Will consult Dr. Hortense Ramal.  Review of Systems: As per HPI; otherwise review of systems reviewed and negative.   Ambulatory Status:  Ambulates without assistance  Past Medical History:  Diagnosis Date  . Alcohol abuse   . Alcoholic pancreatitis    recurrent  . Depression   . Diabetes mellitus, type II ([place])    New Onset 03/2010  . GERD (gastroesophageal reflux disease)   . High cholesterol   . 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 Hypertension   . SDH (subdural hematoma) ([place]) 12/2018   crainotomy  . Seizures ([place])   . Sleep apnea    does not wear a CPAP    Past Surgical History:  Procedure Laterality Date  . COLONOSCOPY W/ BIOPSIES  09/2010   Dr. Ardis Hughs.  for intermittent rectal  bleeding.  Mild sigmoid to descending diverticulosis.  Mild left colon erythema, benign biopsy, probably "prep effect"  . COLONOSCOPY WITH PROPOFOL N/A 07/06/2017   Procedure: COLONOSCOPY WITH PROPOFOL;  Surgeon: Jerene Bears, MD;  Location: [facility];  Service: Gastroenterology;  Laterality: N/A;  . CRANIOTOMY Bilateral 11/24/2018   Procedure: Bilateral craniotomy for evacuation of subdural hematoma;  Surgeon: Erline Levine, MD;  Location: [place] [place];  Service: Neurosurgery;  Laterality: Bilateral;  Bilateral craniotomy for evacuation of subdural hematoma  . CRANIOTOMY Left 01/06/2019   Procedure: CRANIECTOMY;  Surgeon: Earnie Larsson, MD;  Location: [place];  Service: Neurosurgery;  Laterality: Left;  . LUMBAR MICRODISCECTOMY  ~ 2004   Dr Ellene Route    Social History   Socioeconomic History  . Marital status: Divorced    Spouse name: Not on file  . Number of children: 2  . Years of education: Not on file  . Highest education level: Not on file  Occupational History  . Occupation: unemployed    Fish farm manager: Biomedical [employer]  Tobacco Use  . Smoking status: Current Every Day Smoker    Packs/day: 0.50    Years: 2.00    Pack years: 1.00    Types: Cigarettes    Start date: 04/29/2016  . Smokeless tobacco: Never Used  . Tobacco comment: 4 Cigarettes  a day   Substance and Sexual Activity  . Alcohol use: Yes    Comment: vodka daily  . Drug use: Not Currently    Comment: none in years  . Sexual activity: Not on file  Other Topics Concern  . 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 Difficulty of Paying Living Expenses: Not on file  Food Insecurity:   . Worried About Charity fundraiser in the Last Year: Not on file  . Ran Out of Food in  the Last Year: Not on file  Transportation Needs:   . Lack of Transportation (Medical): Not on file  . Lack of Transportation (Non-Medical): Not on file  Physical Activity:   . Days of Exercise per Week: Not on file  . Minutes of Exercise per Session: Not on file  Stress:   . Feeling of Stress : Not on file  Social Connections:   . Frequency of Communication with Friends and Family: Not on file  . Frequency of Social Gatherings with Friends and Family: Not on file  . Attends Religious Services: Not on file  . Active Member of Clubs or Organizations: Not on file  . Attends Archivist Meetings: Not on file  . Marital Status: Not on file  Intimate Partner Violence:   . Fear of Current or Ex-Partner: Not on file  . Emotionally Abused: Not on file  . Physically Abused: Not on file  . Sexually Abused: Not on file    Allergies  Allergen Reactions  . Invokamet [Canagliflozin-Metformin Hcl] Other (See Comments)    Lactic acidosis  . Metformin And Related Other (See Comments)    DRASTIC drop in blood sugar    Family History  Problem Relation Age of Onset  . Diabetes Mother   . Lung cancer Brother        twin brother  . Pancreatic cancer Paternal Aunt   . Colon cancer Neg Hx   . Stomach cancer Neg Hx     Prior to Admission medications   Medication Sig Start Date End Date Taking? Authorizing Provider  amLODipine (NORVASC) 5 MG tablet Take 1 tablet (5 mg total) by mouth daily. 02/15/19   Debbrah Alar, NP  blood glucose meter kit and supplies KIT Dispense based on patient and insurance preference. Use up to four times daily as directed. (FOR ICD-9 250.00, 250.01). 07/24/18   Debbrah Alar, NP  Continuous Blood Gluc Sensor (FREESTYLE LIBRE 14 DAY SENSOR) MISC Inject 1 each into the skin every 14 (fourteen) days. 04/27/18   Debbrah Alar, NP  escitalopram (LEXAPRO) 20 MG tablet TAKE ONE TABLET BY MOUTH DAILY 03/19/19   Debbrah Alar, NP  folic acid  (FOLVITE) 1 MG tablet TAKE ONE TABLET BY MOUTH DAILY 07/05/19   Debbrah Alar, NP  glucose blood (FREESTYLE LITE) test strip Use as instructed 07/24/18   Debbrah Alar, NP  HYDROcodone-acetaminophen (NORCO) 5-325 MG tablet Take 1 tablet by mouth every 6 (six) hours as needed for moderate pain. Patient not taking: Reported on 04/21/2019 03/22/19   Saguier, Percell Miller, PA-C  Insulin Glargine (LANTUS SOLOSTAR) 100 UNIT/ML Solostar Pen Inject 25 Units into the  skin at bedtime. 05/05/19   Debbrah Alar, NP  insulin lispro (HUMALOG KWIKPEN) 100 UNIT/ML KwikPen INJECT INTO THE SKIN 3 TIMES DAILY JUST BEFORE A MEAL (09-10-08 UNITS) 05/05/19   Debbrah Alar, NP  Insulin Pen Needle (ULTICARE SHORT PEN NEEDLES) 31G X 8 MM MISC Use once daily to inject insulin. 04/27/18   Debbrah Alar, NP  Lancets (FREESTYLE) lancets 2 (two) times daily 02/27/17   Debbrah Alar, NP  levETIRAcetam (KEPPRA) 500 MG tablet Take 1 tablet (500 mg total) by mouth 2 (two) times daily. 06/25/19 07/25/19  Lorin Glass, PA-C  LORazepam (ATIVAN) 1 MG tablet TAKE ONE TABLET BY MOUTH EVERY 8 HOURS AS NEEDED FOR ANXIETY 05/24/19   Debbrah Alar, NP  Multiple Vitamins-Minerals (MULTIVITAMIN) tablet Take 1 tablet by mouth daily. 01/14/19   Erline Levine, MD  ondansetron (ZOFRAN ODT) 4 MG disintegrating tablet 56m ODT q4 hours prn nausea/vomit Patient taking differently: Take 4 mg by mouth every 4 (four) hours as needed for nausea or vomiting.  10/13/17   BMalvin Johns MD  pantoprazole (PROTONIX) 40 MG tablet Take 1 tablet (40 mg total) by mouth 2 (two) times daily. 01/14/19   SErline Levine MD  QUEtiapine (SEROQUEL) 300 MG tablet TAKE ONE TABLET BY MOUTH AT BEDTIME 03/22/19   ODebbrah Alar NP  sildenafil (REVATIO) 20 MG tablet TAKE 1-2 TABLETS BY MOUTH PRIOR TO SEXUAL ACTIVITY 03/22/19   ODebbrah Alar NP    Physical Exam: Vitals:   07/06/19 1530 07/06/19 1545 07/06/19 1615 07/06/19 1645  BP:  130/88 (!) 133/95 (!) 140/102 138/90  Pulse: (!) 145 (!) 139 (!) 142 (!) 130  Resp: 13     Temp:      TempSrc:      SpO2: 98% 99% 97% 97%     . General:  Appears mildly confused, slow to answer questions . Eyes:  PERRL, EOMI, normal lids, iris . ENT:  grossly normal hearing, lips & tongue, mmm . Neck:  no LAD, masses or thyromegaly . Cardiovascular:  RR with tachycardia, no m/r/g. No LE edema.  .Marland KitchenRespiratory:   CTA bilaterally with no wheezes/rales/rhonchi.  Normal respiratory effort. . Abdomen:  soft, NT, ND, NABS . Skin:  no rash or induration seen on limited exam . Musculoskeletal:  grossly normal tone BUE/BLE, good ROM, no bony abnormality . Lower extremity:  No LE edema.  Limited foot exam with no ulcerations.  2+ distal pulses. .Marland KitchenPsychiatric:  eccentric mood and affect, speech slowed but generally appropriate . Neurologic:  CN 2-12 grossly intact, moves all extremities in coordinated fashion    Radiological Exams on Admission: EEG  Result Date: 07/06/2019 YLora Havens MD     07/06/2019  4:38 PM Patient Name: JKIERRE HINTZMRN: [id number] Attending: PLora HavensReferring Physician/Provider: Dr PZeb ComfortDate: 07/06/2019 Duration: 27.54 mins Patient history: 44year old male with history of alcohol abuse, uncontrolled diabetes and prior bilateral subdurals who presented with seizures x3. EEG to evaluate for seizure. Level of alertness: awake AEDs during EEG study: ativan, keppra Technical aspects: This EEG study was done with scalp electrodes positioned according to the 10-20 International system of electrode placement. Electrical activity was acquired at a sampling rate of 500Hz and reviewed with a high frequency filter of 70Hz and a low frequency filter of 1Hz. EEG data were recorded continuously and digitally stored. DESCRIPTION: No clear posterior dominant rhythm was seen. EEG showed contiguous generalized 5-7hz theta slowing admixed with 13-15hz generalized  beta activity. Hyperventilation and  photic stimulation were not performed. ABNORMALITY - Continuous slow, generalized - Excessive beta, generalized IMPRESSION: This study is suggestive of mild diffuse encepalopathy, non specific to etiology. The excessive beta activity seen in the background is most likely due to the effect of benzodiazepine and is a benign EEG pattern. No seizures or epileptiform discharges were seen throughout the recording. Lora Havens   CT Head Wo Contrast  Result Date: 07/06/2019 CLINICAL DATA:  Seizure. Alcohol/drug related. History of craniotomy EXAM: CT HEAD WITHOUT CONTRAST TECHNIQUE: Contiguous axial images were obtained from the base of the skull through the vertex without intravenous contrast. COMPARISON:  CT head 06/25/2019 FINDINGS: Brain: Chronic dural thickening left frontal region is unchanged due to chronic subdural hematoma. No acute hemorrhage. No acute infarct or mass. Mild atrophy and mild chronic white matter changes. Vascular: Negative for hyperdense vessel. Skull: Bilateral chronic craniotomy.  No acute skeletal abnormality. Sinuses/Orbits: Negative Other: None IMPRESSION: No acute abnormality no change from the prior CT Atrophy and chronic left frontal dural thickening due to prior subdural hematoma. Electronically Signed   By: Franchot Gallo M.D.   On: 07/06/2019 13:10   DG Chest Port 1 View  Result Date: 07/06/2019 CLINICAL DATA:  Seizure EXAM: PORTABLE CHEST 1 VIEW COMPARISON:  01/09/2019 FINDINGS: Single frontal view of the chest demonstrates an unremarkable cardiac silhouette. No airspace disease, effusion, or pneumothorax. No acute bony abnormality. IMPRESSION: No active disease. Electronically Signed   By: Randa Ngo M.D.   On: 07/06/2019 13:40    EKG: Independently reviewed.  Sinus tachcyardia with rate 139; LVH;  nonspecific ST changes with no evidence of acute ischemia   Labs on Admission: I have personally reviewed the available labs and  imaging studies at the time of the admission.  Pertinent labs:   ABG 7.265/30.5/206/13.8 CO2 13 Glucose 306 Anion gap 33 AP 250 AST 111/ALT 69 Lactate >11  Normal CBC APAP <10 ASA <7 UA: >500 glucose USD + BZD Beta-hydroxybutyrate 0.22 COVID pending   Assessment/Plan Principal Problem:   DKA (diabetic ketoacidosis) ([place]) Active Problems:   Essential hypertension   Dyslipidemia   Alcohol abuse   Lactic acidosis   Alcohol withdrawal seizure with complication, with unspecified complication ([facility])   SDH (subdural hematoma) ([facility])   OSA (obstructive sleep apnea)   DKA -Patient with poor baseline control (A1c 8.9) -He has not been eating/drinking well and likely has not been compliant with insulin due to persistent ETOH -No indication of illness as source -Otherwise no apparent reason for DKA -Moderate DKA on admission  -Will observe in SDU with DKA protocol -Would recommend continuing insulin drip at least until morning regardless of rapidity of closure of gap and normalization of labs -K+ slightly increased after potassium supplementation added, this is held for now -IVF at 150 cc/hr, NS until glucose <250 and then decrease rate to 125 and change to D51/2NS -Diabetes coordinator consulted  Seizure -Patient with significant h/o ETOH depedence -He also has h/o ETOH withdrawal seizures -However, he also has h/o SDH and chronic persistent SDH and this increases his likelihood of ongoing seizures -EEG obtained with mild diffuse encephalopathy but no frank seizure activity -Dr. Hortense Ramal has consulted and recommends ongoing Keppra 500 mg BID after initial loading dose -He also will need driving restriction for at least 6 months -Seizure precautions -Ativan prn  ETOH dependence -Patient with chronic ETOH dependence -He reports not drinking in 2 days but his ETOH level is still quite elevated -He is at very high risk  for complications of withdrawal including recurrent seizures,  DTs -Will observe -SDU CIWA protocol -TOC team consult for possible inpatient treatment -Elevated LFTs are likely related to alcoholism  HTN -Continue Norvasc  HLD -He does not appear to be taking medication for this issue at this time  SDH -H/o craniotomy in 12/2018 -Appears to have chronic SDH on imaging at this time -Does not appear to require intervention -However, ongoing falls from ETOH intoxication should be highly discouraged  OSA -Does not wear CPAP  Lactic acidosis -Likely due to a combination of DKA, seizure, and ETOH -Will continue to trend to ensure ongoing clearance -Currently not suspected to have sepsis, no indication of infection at this time  Depression -Continue Lexapro, Seroquel   Note: This patient has been tested and is pending for the novel coronavirus COVID-19.  DVT prophylaxis:  Lovenox  Code Status:  Full  Family Communication: None present; I attempted to reach his girlfriend at the time of admission and was unable to reach her Disposition Plan: He is anticipated to d/c to home without [facility] services once his DKA and ETOH-related issues have been resolved. Consults called: Neurology; DM coordinator; [facility] team  Admission status: It is my clinical opinion that referral for OBSERVATION is reasonable and necessary in this patient based on the above information provided. The aforementioned taken together are felt to place the patient at high risk for further clinical deterioration. However it is anticipated that the patient may be medically stable for discharge from the hospital within 24 to 48 hours.    Karmen Bongo MD [facility]   How to contact the [facility] Attending or Consulting provider [place] or covering provider during after hours [place], for this patient?  1. Check the care team in [facility] and look for a) attending/consulting TR[facility] provider listed and b) the [facility] team listed 2. Log into [email or web address] and use [facility]'s universal password to  access. If you do not have the password, please contact the hospital operator. 3. Locate the [facility] provider you are looking for under [facility] and page to a number that you can be directly reached. 4. If you still have difficulty reaching the provider, please page the [facility] (Director on Call) for the Hospitalists listed on amion for assistance.   07/06/2019, 4:48 PM

## 2019-07-06 NOTE — Procedures (Signed)
Patient Name: Willie [name]  MRN: [id number]  Epilepsy Attending: Lora Havens  Referring Physician/Provider: Dr Zeb Comfort Date: 07/06/2019  Duration: 27.54 mins  Patient history: 44 year old male with history of alcohol abuse, uncontrolled diabetes and prior bilateral subdurals who presented with seizures x3. EEG to evaluate for seizure.  Level of alertness: awake  AEDs during EEG study: ativan, keppra  Technical aspects: This EEG study was done with scalp electrodes positioned according to the 10-20 International system of electrode placement. Electrical activity was acquired at a sampling rate of 500Hz  and reviewed with a high frequency filter of 70Hz  and a low frequency filter of 1Hz . EEG data were recorded continuously and digitally stored.   DESCRIPTION: No clear posterior dominant rhythm was seen. EEG showed contiguous generalized 5-7hz  theta slowing admixed with 13-15hz  generalized beta activity. Hyperventilation and photic stimulation were not performed.  ABNORMALITY - Continuous slow, generalized - Excessive beta, generalized   IMPRESSION: This study is suggestive of mild diffuse encepalopathy, non specific to etiology. The excessive beta activity seen in the background is most likely due to the effect of benzodiazepine and is a benign EEG pattern. No seizures or epileptiform discharges were seen throughout the recording.  Haizlee Henton Barbra Sarks

## 2019-07-06 NOTE — ED Provider Notes (Signed)
[facility] EMERGENCY DEPARTMENT Provider Note   CSN: [id number] Arrival date & time: 07/06/19  1121     History No chief complaint on file.  LEVEL 5 CAVEAT - POST ICTAL S/P SEIZURE  Willie [name] is a 43 y.o. male with PMhx alcohol abuse, alcohol withdrawal seizure, type 2 diabetes, HTN, high cholesterol , depression, hx of subdural hematoma who presents to the ED today via EMS for seizure.  Per triage report patient was called out after he had a 5-minute full body seizure earlier this morning.  He had another seizure with EMS and was given 5 mg of Versed IM.  Also the ED patient is postictal however nurse reports that during her exam patient turn on TV turned on volume and rolled over to go to sleep.  EMS patient CBG 417.   Unable to get a hold of family for more information.   Pt most recently seen in the ED on 06/25/19 for seizure like activity with concern for alcohol withdrawal seizure. Work up in the ED with potassium 3.1 and glucose 28. Pt given keppra load as he was on this in the past with his hx of multiple brain surgeries. Admission was discussed with pt however he left AMA.   The history is provided by the EMS personnel and medical records.       Past Medical History:  Diagnosis Date  . Alcohol abuse   . Alcoholic pancreatitis    recurrent  . Depression   . Diabetes mellitus, type II (HCC)    New Onset 03/2010  . GERD (gastroesophageal reflux disease)   . High cholesterol   . 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 Hypertension   . SDH (subdural hematoma) (HCC) 12/2018   crainotomy  . Seizures (HCC)   . Sleep apnea    does not wear a CPAP    Patient Active Problem List   Diagnosis Date Noted  . DKA (diabetic ketoacidosis) (HCC) 07/06/2019  . Malnutrition of moderate degree 01/09/2019  . 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 Subdural bleeding (HCC) 11/24/2018  . Abdominal pain 11/22/2018  .  SDH (subdural hematoma) (HCC) 11/22/2018  . Abnormal LFTs 11/22/2018  . IDDM (insulin dependent diabetes mellitus) 09/12/2018  . Acute hepatitis 09/11/2018  . Alcohol-induced mood disorder (HCC)   . DKA (diabetic ketoacidoses) (HCC) 07/28/2017  . Alcohol withdrawal (HCC) 07/28/2017  . Abnormal CT scan, colon   . Benign neoplasm of descending colon   . Lower GI bleed 07/04/2017  . Transaminitis 07/04/2017  . Pain   . Bleeding internal hemorrhoids   . Anemia of chronic disease   . Left arm weakness   . Anemia, blood loss   . Vomiting alone 03/27/2017  . Chest pain 03/27/2017  . Nausea and vomiting   . Lactic acidosis 03/26/2017  . History of alcohol abuse 06/25/2016  . Tobacco abuse 06/25/2016  . Anxiety state 11/20/2015  . Dental infection 07/10/2015  . Mood disorder (HCC) 04/18/2015  . Eczema 06/27/2014  . GERD (gastroesophageal reflux disease) 05/24/2014  . Snoring 04/07/2013  . Suicide attempt by substance overdose (HCC) 02/10/2013  . Alcohol abuse 12/31/2012  . Erectile dysfunction 12/09/2012  . Hematuria 06/11/2012  . Vision problem 02/02/2012  . Insomnia 12/20/2011  . Chronic pancreatitis (HCC) 02/05/2011  . Depression with anxiety 11/13/2010  . Hyperlipemia 06/27/2010  . Diabetes type 2, uncontrolled (HCC) 05/22/2010  . Essential hypertension 05/22/2010      Past Surgical History:  Procedure Laterality Date  . COLONOSCOPY W/ BIOPSIES  09/2010   Dr. Jacobs.  for intermittent rectal bleeding.  Mild sigmoid to descending diverticulosis.  Mild left colon erythema, benign biopsy, probably "prep effect"  . COLONOSCOPY WITH PROPOFOL N/A 07/06/2017   Procedure: COLONOSCOPY WITH PROPOFOL;  Surgeon: Pyrtle, Jay M, MD;  Location: [facility];  Service: Gastroenterology;  Laterality: N/A;  . CRANIOTOMY Bilateral 11/24/2018   Procedure: Bilateral craniotomy for evacuation of subdural hematoma;  Surgeon: Stern, Joseph, MD;  Location: [facility] OR;  Service: Neurosurgery;  Laterality:  Bilateral;  Bilateral craniotomy for evacuation of subdural hematoma  . CRANIOTOMY Left 01/06/2019   Procedure: CRANIECTOMY;  Surgeon: Pool, Henry, MD;  Location: [facility] OR;  Service: Neurosurgery;  Laterality: Left;  . LUMBAR MICRODISCECTOMY  ~ 2004   Dr Elsner       Family History  Problem Relation Age of Onset  . Diabetes Mother   . Lung cancer Brother        twin brother  . Pancreatic cancer Paternal Aunt   . Colon cancer Neg Hx   . Stomach cancer Neg Hx     Social History   Tobacco Use  . Smoking status: Current Every Day Smoker    Packs/day: 0.50    Years: 2.00    Pack years: 1.00    Types: Cigarettes    Start date: 04/29/2016  . Smokeless tobacco: Never Used  . Tobacco comment: 4 Cigarettes a day   Substance Use Topics  . Alcohol use: Yes    Comment: vodka daily  . Drug use: Not Currently    Comment: none in years    Home Medications Prior to Admission medications   Medication Sig Start Date End Date Taking? Authorizing Provider  amLODipine (NORVASC) 5 MG tablet Take 1 tablet (5 mg total) by mouth daily. 02/15/19  Yes O'Sullivan, [name], NP  blood glucose meter kit and supplies KIT Dispense based on patient and insurance preference. Use up to four times daily as directed. (FOR ICD-9 250.00, 250.01). 07/24/18  Yes O'Sullivan, [name], NP  Continuous Blood Gluc Sensor (FREESTYLE LIBRE 14 DAY SENSOR) MISC Inject 1 each into the skin every 14 (fourteen) days. 04/27/18  Yes O'Sullivan, [name], NP  escitalopram (LEXAPRO) 20 MG tablet TAKE ONE TABLET BY MOUTH DAILY Patient taking differently: Take 20 mg by mouth daily.  03/19/19  Yes O'Sullivan, [name], NP  folic acid (FOLVITE) 1 MG tablet TAKE ONE TABLET BY MOUTH DAILY Patient taking differently: Take 1 mg by mouth daily.  07/05/19  Yes O'Sullivan, [name], NP  glucose blood (FREESTYLE LITE) test strip Use as instructed 07/24/18  Yes O'Sullivan, [name], NP  Insulin Glargine (LANTUS SOLOSTAR) 100 UNIT/ML Solostar Pen Inject 25  Units into the skin at bedtime. 05/05/19  Yes O'Sullivan, [name], NP  insulin lispro (HUMALOG KWIKPEN) 100 UNIT/ML KwikPen INJECT INTO THE SKIN 3 TIMES DAILY JUST BEFORE A MEAL (09-10-08 UNITS) Patient taking differently: Inject 5-15 Units into the skin See admin instructions. INJECT INTO THE SKIN 3 TIMES DAILY JUST BEFORE A MEAL (5 units breakfast-15 units lunch-10 units dinner) 05/05/19  Yes O'Sullivan, [name], NP  Insulin Pen Needle (ULTICARE SHORT PEN NEEDLES) 31G X 8 MM MISC Use once daily to inject insulin. 04/27/18  Yes O'Sullivan, [name], NP  Lancets (FREESTYLE) lancets 2 (two) times daily 02/27/17  Yes O'Sullivan, [name], NP  levETIRAcetam (KEPPRA) 500 MG tablet Take 1 tablet (500 mg total) by mouth 2 (two) times daily. 06/25/19 07/25/19 Yes Hammond, Elizabeth   W, PA-C  LORazepam (ATIVAN) 1 MG tablet TAKE ONE TABLET BY MOUTH EVERY 8 HOURS AS NEEDED FOR ANXIETY Patient taking differently: Take 1 mg by mouth every 8 (eight) hours as needed for anxiety.  05/24/19  Yes Debbrah Alar, NP  Multiple Vitamins-Minerals (MULTIVITAMIN) tablet Take 1 tablet by mouth daily. 01/14/19  Yes Erline Levine, MD  pantoprazole (PROTONIX) 40 MG tablet Take 1 tablet (40 mg total) by mouth 2 (two) times daily. 01/14/19  Yes Erline Levine, MD  QUEtiapine (SEROQUEL) 300 MG tablet TAKE ONE TABLET BY MOUTH AT BEDTIME Patient taking differently: Take 300 mg by mouth at bedtime.  03/22/19  Yes Debbrah Alar, NP  sildenafil (REVATIO) 20 MG tablet TAKE 1-2 TABLETS BY MOUTH PRIOR TO SEXUAL ACTIVITY Patient taking differently: Take 20-40 mg by mouth See admin instructions. Prior to sexual acivity 03/22/19  Yes Debbrah Alar, NP  ondansetron (ZOFRAN ODT) 4 MG disintegrating tablet 50m ODT q4 hours prn nausea/vomit Patient not taking: Reported on 07/06/2019 10/13/17   BMalvin Johns MD    Allergies    Invokamet [canagliflozin-metformin hcl] and Metformin and related  Review of Systems   Review of Systems  Unable  to perform ROS: Mental status change  Neurological: Positive for seizures.    Physical Exam Updated Vital Signs BP (!) 155/106   Pulse (!) 153   Temp 98.3 F (36.8 C) (Oral)   Resp 19   SpO2 100%   Physical Exam Vitals and nursing note reviewed.  Constitutional:      Appearance: He is not ill-appearing or diaphoretic.  HENT:     Head: Normocephalic.  Eyes:     Extraocular Movements: Extraocular movements intact.     Conjunctiva/sclera: Conjunctivae normal.     Pupils: Pupils are equal, round, and reactive to light.  Cardiovascular:     Rate and Rhythm: Normal rate and regular rhythm.  Pulmonary:     Effort: Pulmonary effort is normal.     Breath sounds: Normal breath sounds.  Abdominal:     Palpations: Abdomen is soft.     Tenderness: There is no guarding or rebound.  Musculoskeletal:     Cervical back: Neck supple.  Skin:    General: Skin is warm and dry.  Neurological:     Mental Status: He is alert.     Comments: Post ictal during exam. Able to follow some commands. Responds to painful stimuli. Moving all extremities.      ED Results / Procedures / Treatments   Labs (all labs ordered are listed, but only abnormal results are displayed) Labs Reviewed  COMPREHENSIVE METABOLIC PANEL - Abnormal; Notable for the following components:      Result Value   Chloride 90 (*)    CO2 13 (*)    Glucose, Bld 603 (*)    BUN 5 (*)    AST 111 (*)    ALT 69 (*)    Alkaline Phosphatase 250 (*)    Anion gap 33 (*)    All other components within normal limits  CBC WITH DIFFERENTIAL/PLATELET - Abnormal; Notable for the following components:   Neutro Abs 9.2 (*)    Lymphs Abs 0.4 (*)    All other components within normal limits  LACTIC ACID, PLASMA - Abnormal; Notable for the following components:   Lactic Acid, Venous >11.0 (*)    All other components within normal limits  URINALYSIS, ROUTINE W REFLEX MICROSCOPIC - Abnormal; Notable for the following components:   Color,  Urine STRAW (*)  Glucose, UA >=500 (*)    All other components within normal limits  RAPID URINE DRUG SCREEN, HOSP PERFORMED - Abnormal; Notable for the following components:   Benzodiazepines POSITIVE (*)    All other components within normal limits  ETHANOL - Abnormal; Notable for the following components:   Alcohol, Ethyl (B) 143 (*)    All other components within normal limits  SALICYLATE LEVEL - Abnormal; Notable for the following components:   Salicylate Lvl <7.0 (*)    All other components within normal limits  ACETAMINOPHEN LEVEL - Abnormal; Notable for the following components:   Acetaminophen (Tylenol), Serum <10 (*)    All other components within normal limits  CBG MONITORING, ED - Abnormal; Notable for the following components:   Glucose-Capillary 589 (*)    All other components within normal limits  POCT I-STAT EG7 - Abnormal; Notable for the following components:   pCO2, Ven 30.5 (*)    pO2, Ven 206.0 (*)    Bicarbonate 13.8 (*)    TCO2 15 (*)    Acid-base deficit 12.0 (*)    Sodium 134 (*)    Calcium, Ion 1.03 (*)    All other components within normal limits  CBG MONITORING, ED - Abnormal; Notable for the following components:   Glucose-Capillary 421 (*)    All other components within normal limits  SARS CORONAVIRUS 2 (TAT 6-24 HRS)  BETA-HYDROXYBUTYRIC ACID  LACTIC ACID, PLASMA  BASIC METABOLIC PANEL  BASIC METABOLIC PANEL  BASIC METABOLIC PANEL  BASIC METABOLIC PANEL  BETA-HYDROXYBUTYRIC ACID  BETA-HYDROXYBUTYRIC ACID  HEMOGLOBIN A1C  I-STAT VENOUS BLOOD GAS, ED    EKG EKG Interpretation  Date/Time:  Tuesday July 06 2019 11:31:50 EST Ventricular Rate:  139 PR Interval:    QRS Duration: 87 QT Interval:  311 QTC Calculation: 473 R Axis:   39 Text Interpretation: Sinus tachycardia Probable left ventricular hypertrophy Non-specific ST-t changes Baseline wander Confirmed by Steinl, Kevin ([id number]) on 07/06/2019 11:37:21 AM   Radiology CT Head Wo  Contrast  Result Date: 07/06/2019 CLINICAL DATA:  Seizure. Alcohol/drug related. History of craniotomy EXAM: CT HEAD WITHOUT CONTRAST TECHNIQUE: Contiguous axial images were obtained from the base of the skull through the vertex without intravenous contrast. COMPARISON:  CT head 06/25/2019 FINDINGS: Brain: Chronic dural thickening left frontal region is unchanged due to chronic subdural hematoma. No acute hemorrhage. No acute infarct or mass. Mild atrophy and mild chronic white matter changes. Vascular: Negative for hyperdense vessel. Skull: Bilateral chronic craniotomy.  No acute skeletal abnormality. Sinuses/Orbits: Negative Other: None IMPRESSION: No acute abnormality no change from the prior CT Atrophy and chronic left frontal dural thickening due to prior subdural hematoma. Electronically Signed   By: Charles  Clark M.D.   On: 07/06/2019 13:10   DG Chest Port 1 View  Result Date: 07/06/2019 CLINICAL DATA:  Seizure EXAM: PORTABLE CHEST 1 VIEW COMPARISON:  01/09/2019 FINDINGS: Single frontal view of the chest demonstrates an unremarkable cardiac silhouette. No airspace disease, effusion, or pneumothorax. No acute bony abnormality. IMPRESSION: No active disease. Electronically Signed   By: Michael  Brown M.D.   On: 07/06/2019 13:40    Procedures .Critical Care Performed by: , , PA-C Authorized by: , , PA-C   Critical care provider statement:    Critical care time (minutes):  45   Critical care was necessary to treat or prevent imminent or life-threatening deterioration of the following conditions:  Endocrine crisis, CNS failure or compromise, toxidrome and metabolic crisis   Critical care was   time spent personally by me on the following activities:  Discussions with consultants, evaluation of patient's response to treatment, examination of patient, ordering and performing treatments and interventions, ordering and review of laboratory studies, ordering and review of  radiographic studies, pulse oximetry, re-evaluation of patient's condition, obtaining history from patient or surrogate and review of old charts   (including critical care time)  Medications Ordered in ED Medications  potassium chloride 10 mEq in 100 mL IVPB (10 mEq Intravenous New Bag/Given 07/06/19 1412)  amLODipine (NORVASC) tablet 5 mg (has no administration in time range)  escitalopram (LEXAPRO) tablet 20 mg (has no administration in time range)  QUEtiapine (SEROQUEL) tablet 300 mg (has no administration in time range)  pantoprazole (PROTONIX) EC tablet 40 mg (has no administration in time range)  LORazepam (ATIVAN) tablet 1-4 mg ( Oral See Alternative 07/06/19 1453)    Or  LORazepam (ATIVAN) injection 1-4 mg (2 mg Intravenous Given 07/06/19 1453)  thiamine tablet 100 mg (has no administration in time range)    Or  thiamine (B-1) injection 100 mg (has no administration in time range)  folic acid (FOLVITE) tablet 1 mg (has no administration in time range)  multivitamin with minerals tablet 1 tablet (has no administration in time range)  enoxaparin (LOVENOX) injection 40 mg (has no administration in time range)  LORazepam (ATIVAN) injection 1-2 mg (has no administration in time range)  acetaminophen (TYLENOL) tablet 650 mg (has no administration in time range)    Or  acetaminophen (TYLENOL) suppository 650 mg (has no administration in time range)  docusate sodium (COLACE) capsule 100 mg (has no administration in time range)  polyethylene glycol (MIRALAX / GLYCOLAX) packet 17 g (has no administration in time range)  ondansetron (ZOFRAN) tablet 4 mg ( Oral See Alternative 07/06/19 1453)    Or  ondansetron (ZOFRAN) injection 4 mg (4 mg Intravenous Given 07/06/19 1453)  diazepam (VALIUM) tablet 5 mg (has no administration in time range)  insulin regular, human (MYXREDLIN) 100 units/ 100 mL infusion (8 Units/hr Intravenous New Bag/Given 07/06/19 1436)  0.9 %  sodium chloride infusion (1,000 mLs  Intravenous New Bag/Given 07/06/19 1437)  dextrose 5 %-0.45 % sodium chloride infusion (has no administration in time range)  dextrose 50 % solution 0-50 mL (has no administration in time range)  sodium chloride 0.9 % bolus 1,000 mL (has no administration in time range)  potassium chloride 10 mEq in 100 mL IVPB (has no administration in time range)  levETIRAcetam (KEPPRA) tablet 500 mg (has no administration in time range)  insulin aspart (novoLOG) injection 10 Units (10 Units Intravenous Given 07/06/19 1241)  sodium chloride 0.9 % bolus 1,000 mL (0 mLs Intravenous Stopped 07/06/19 1453)  levETIRAcetam (KEPPRA) IVPB 1000 mg/100 mL premix (0 mg Intravenous Stopped 07/06/19 1312)    ED Course  I have reviewed the triage vital signs and the nursing notes.  Pertinent labs & imaging results that were available during my care of the patient were reviewed by me and considered in my medical decision making (see chart for details).  Clinical Course as of Jul 05 1505  Tue Jul 06, 2019  1248 Lactic Acid, Venous(!!): >11.0 [MV]    Clinical Course User Index [MV] Eustaquio Maize, [place]   44 year old male who presents to the ED today via EMS for seizure-like activity.  History of alcohol withdrawal and alcohol withdrawal seizures.  Had another seizure with EMS and received 5 mg IV Versed.  On arrival to the ED patient is postictal.  Arrival to the ED patient is afebrile, tachycardic in the 140s, nontachypneic.  He is able to follow some commands.  Able to track my finger with his eyes.  Moving all extremities without difficulty.  During exam patient had another seizure lasting approximately 2 minutes.  1 mg IV Ativan given at patient immediately stopped seizing.  Placed on CIWA protocol at this time.  Will obtain screening labs.  Will load with Keppra.   Glucose with EMS 417. CBG in the ED 589. 10 units IV insulin ordered. VBG ordered to rule out DKA.   I stat VBG with Ph 7.265. Bicarb 13.8. Will start on  insulin drip.   Nursing staff informed that lactic acid  > 11.0. Again pt afebrile. Suspect elevation most likely due to persistent seizures. Will add on CXR to rule out infection. Awaiting U/A and CBC.   CBC without leukocytosis. Hgb stable.  U/A without infection.  UDS positive for benzos.   Alcohol level elevated 143.   CT head negative at this time.   Beta hydroxybutyric acid within normal limits. Based on pH it does not appear pt is in HHS. Strong suspicion for DKA. Receiving treatment.   Pt continues to be tachycardic. He began getting more agitated; Ativan ordered. Pt will need admission at this time for DKA and alcohol withdrawal seizures.   Discussed case with Dr. Yates with [facility] who agrees to accept patient for admission. Recommends touching base with epileptologist - will consult.   Dr. Yadav epileptologist will evaluate patient.   This note was prepared using Dragon voice recognition software and may include unintentional dictation errors due to the inherent limitations of voice recognition software.   MDM Rules/Calculators/A&P                      Final Clinical Impression(s) / ED Diagnoses Final diagnoses:  Alcohol withdrawal seizure with complication (HCC)  Diabetic ketoacidosis without coma associated with type 2 diabetes mellitus (HCC)  Lactic acidosis  Alcoholic intoxication without complication (HCC)    Rx / DC Orders ED Discharge Orders    None       , , PA-C 07/06/19 1507    Steinl, Kevin, MD 07/08/19 0822  

## 2019-07-06 NOTE — ED Notes (Signed)
It should be noted that insulin was stopped on the last reading and has been clamped off per endo tool. MD has been paged.

## 2019-07-06 NOTE — ED Notes (Signed)
Pt given 1 IM ativan for seizure that occurred while PA was assessing pt. Seizure began localized and progressed to full body but stopped about thirty seconds after ativan was given. Pt had questionable ability to follow commands at beginning of seizure.

## 2019-07-06 NOTE — ED Notes (Signed)
RN attempted IV x3, unsuccessful. IV team consult placed

## 2019-07-06 NOTE — ED Notes (Signed)
cbg 68, nurse aware.

## 2019-07-06 NOTE — Progress Notes (Signed)
EEG complete - results pending 

## 2019-07-06 NOTE — ED Triage Notes (Signed)
Pt arrives following seizure, last seen normal at 8:30 yesterday morning. Found by girlfriend, stated she startled him and he had five minute full body seizure. Had another seizure for EMS, was given 5mg  Versed IM. Postictal and does not answer questions to assess orientation buyt while typing this note pt turned on TV, turned down volume, and rolled over to go to sleep.  Answers "No" when PA asks him if he remembers what happened.  CBG 417 for EMS, diabetic, not compliant with medications per EMS.

## 2019-07-06 NOTE — Consult Note (Signed)
Neurology Consultation Reason for Consult: Seizure Referring Physician: Dr. Karmen Bongo  CC: Seizure  History is obtained from: Patient and chart review  HPI: Willie [name] is a 44 y.o. male with past medical history of diabetes, alcohol use, bilateral subdurals and seizure who presented with seizures.  Patient states he had 3 seizures today, unable to elaborate any further.  Per review of ED notes, patient was found to have a generalized tonic-clonic seizure by his girlfriend this morning.  She called EMS.  Patient had another seizure in front of EMS and was given 5 mg IM Versed.  He was brought to [facility] health ED where he had another generalized tonic-clonic seizure and was given IV Ativan.  Of note, on arrival CBG was 417, alcohol level was 143.  Patient states he has been compliant with his medications.  He does report drinking half a bottle of vodka daily but states his last drink was 2 days ago.   Seizure history: Patient states this is his second time having seizures.  Reports having generalized tonic-clonic seizure few days ago.  Per review of records, patient was evaluated in our ED on 06/25/2019.  Per ED note, patient was at a friend's house and had generalized tonic-clonic seizure.  On arrival to ED, he reported not having alcohol for 2 days.  With EMS his blood glucose was in the 70s, blood glucose 28 on BMP and alcohol level less than 10.  Patient was diagnosed with likely alcohol withdrawal seizures and recommended admission but patient decided to leave [place].   Of note, per review of chart patient is supposed to be on Keppra 500 mg twice daily.  However on inquiring with the patient, he denies taking any antiseizure medications.  ROS: A 14 point ROS was performed and is negative except as noted in the HPI.    Past Medical History:  Diagnosis Date  . Alcohol abuse   . Alcoholic pancreatitis    recurrent  . Depression   . Diabetes mellitus, type II ([place])     New Onset 03/2010  . GERD (gastroesophageal reflux disease)   . High cholesterol   . 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 Hypertension   . Sleep apnea    does not wear a CPAP    Family History  Problem Relation Age of Onset  . Diabetes Mother   . Lung cancer Brother        twin brother  . Pancreatic cancer Paternal Aunt   . Colon cancer Neg Hx   . Stomach cancer Neg Hx    Social History:  reports that he smokes half a pack per day.  He also reports drinking half a bottle of vodka daily  Exam: Current vital signs: BP (!) 135/101   Pulse (!) 140   Temp 98.3 F (36.8 C) (Oral)   Resp 14   SpO2 96%  Vital signs in last 24 hours: Temp:  [98.3 F (36.8 C)] 98.3 F (36.8 C) (03/09 1136) Pulse Rate:  [44-158] 140 (03/09 1412) Resp:  [12-19] 14 (03/09 1345) BP: (120-155)/(100-106) 135/101 (03/09 1412) SpO2:  [77 %-100 %] 96 % (03/09 1345)   Physical Exam  Constitutional: Appears well-developed and well-nourished.  Psych: Affect appropriate to situation Eyes: No scleral injection HENT: No OP obstrucion Head: Normocephalic, atraumatic Cardiovascular: Normal rate and regular rhythm.  Respiratory: Effort normal, non-labored breathing GI: Soft.  No distension. There  is no tenderness.  Skin: warm, no apparent ulcers  Neuro: Mental Status: Patient is awake, alert, oriented to person, place and time ( only to month, not year) No signs of aphasia or neglect Cranial Nerves: II: Visual Fields are full. Pupils are equal, round, and reactive to light.  III,IV, VI: EOMI without ptosis or diploplia.  V: Facial sensation is symmetric to temperature VII: Facial movement is symmetric.  VIII: hearing is intact to voice X: Uvula elevates symmetrically XI: Shoulder shrug is symmetric. XII: tongue is midline without atrophy or fasciculations.  Motor: Tone is normal. Bulk is normal. 5/5 strength was present in all four  extremities. Mild action tremor bilaterally Sensory: Sensation is symmetric to light touch  in the arms and legs. Deep Tendon Reflexes: 2+ and symmetric in the biceps and patellae.  Cerebellar: FNF are intact bilaterally  I have reviewed labs in epic and the results pertinent to this consultation are: Alcohol level 143 Lactic acid more than 11, anion gap 33 No leukocytosis, AST 111, ALT 69, alkaline phosphatase 250 Urine drug screen positive for benzodiazepines Capillary glucose 421  I have reviewed the images obtained: CT head without contrast: No acute abnormality no change from the prior CT. Atrophy and chronic left frontal dural thickening due to prior subdural hematoma.  ASSESSMENT/PLAN: 44 year old male with history of alcohol abuse, uncontrolled diabetes and prior bilateral subdurals who presented with seizures x3.   Suspected provoked seizures Hyperglycemia Alcohol intoxication -Most likely etiology for seizures today is hyperglycemia and alcohol intoxication.  However patient does have bilateral subdurals which increases his risk of seizure recurrence especially in the setting of blood sugar fluctuations (reported noncompliance with medication) and alcohol abuse which also lowers seizure threshold   Recommendations -Start patient on Keppra 500 mg twice daily -Will obtain routine EEG to assess for potential epileptogenicity -Counseled patient on alcohol cessation, discussed effects of alcohol on lowering seizure threshold -Also counseled patient on medication compliance including strict blood glucose management and its effect on lowering seizure threshold -Agree with thiamine and CIWA protocol to monitor for alcohol withdrawal -Continue home dose of benzodiazepine -Continue seizure precautions -As needed IV Ativan 2 mg for generalized tonic-clonic seizure lasting more than 2 minutes -Management of rest of the comorbidities per primary team  Thank you for allowing [place] to  participate in the care of this patient.  Neurology will follow.  Please page neuro hospitalist for any further questions after 5 PM  [place]

## 2019-07-07 ENCOUNTER — Other Ambulatory Visit: Payer: Self-pay

## 2019-07-07 ENCOUNTER — Encounter (HOSPITAL_COMMUNITY): Payer: Self-pay | Admitting: Internal Medicine

## 2019-07-07 DIAGNOSIS — F10239 Alcohol dependence with withdrawal, unspecified: Secondary | ICD-10-CM

## 2019-07-07 DIAGNOSIS — E872 Acidosis: Secondary | ICD-10-CM | POA: Diagnosis not present

## 2019-07-07 DIAGNOSIS — F10920 Alcohol use, unspecified with intoxication, uncomplicated: Secondary | ICD-10-CM | POA: Diagnosis not present

## 2019-07-07 DIAGNOSIS — E876 Hypokalemia: Secondary | ICD-10-CM

## 2019-07-07 DIAGNOSIS — E1110 Type 2 diabetes mellitus with ketoacidosis without coma: Secondary | ICD-10-CM | POA: Diagnosis not present

## 2019-07-07 DIAGNOSIS — R569 Unspecified convulsions: Secondary | ICD-10-CM

## 2019-07-07 LAB — GLUCOSE, CAPILLARY
Glucose-Capillary: 123 mg/dL — ABNORMAL HIGH (ref 70–99)
Glucose-Capillary: 29 mg/dL — CL (ref 70–99)
Glucose-Capillary: 54 mg/dL — ABNORMAL LOW (ref 70–99)
Glucose-Capillary: 134 mg/dL — ABNORMAL HIGH (ref 70–99)
Glucose-Capillary: 119 mg/dL — ABNORMAL HIGH (ref 70–99)
Glucose-Capillary: 248 mg/dL — ABNORMAL HIGH (ref 70–99)
Glucose-Capillary: 213 mg/dL — ABNORMAL HIGH (ref 70–99)

## 2019-07-07 LAB — PHOSPHORUS: Phosphorus: 2.4 mg/dL — ABNORMAL LOW (ref 2.5–4.6)

## 2019-07-07 LAB — CBC
HCT: 31.1 % — ABNORMAL LOW (ref 39.0–52.0)
Hemoglobin: 10.7 g/dL — ABNORMAL LOW (ref 13.0–17.0)
MCH: 32.8 pg (ref 26.0–34.0)
MCHC: 34.4 g/dL (ref 30.0–36.0)
MCV: 95.4 fL (ref 80.0–100.0)
Platelets: 196 10*3/uL (ref 150–400)
RBC: 3.26 MIL/uL — ABNORMAL LOW (ref 4.22–5.81)
RDW: 12.4 % (ref 11.5–15.5)
WBC: 9.4 10*3/uL (ref 4.0–10.5)
nRBC: 0 % (ref 0.0–0.2)

## 2019-07-07 LAB — COMPREHENSIVE METABOLIC PANEL
ALT: UNDETERMINED U/L (ref 0–44)
AST: 250 U/L — ABNORMAL HIGH (ref 15–41)
Albumin: 2.7 g/dL — ABNORMAL LOW (ref 3.5–5.0)
Alkaline Phosphatase: 155 U/L — ABNORMAL HIGH (ref 38–126)
Anion gap: 15 (ref 5–15)
BUN: <5 mg/dL — ABNORMAL LOW (ref 6–20)
CO2: 23 mmol/L (ref 22–32)
Calcium: 8.4 mg/dL — ABNORMAL LOW (ref 8.9–10.3)
Chloride: 101 mmol/L (ref 98–111)
Creatinine, Ser: 0.81 mg/dL (ref 0.61–1.24)
GFR calc Af Amer: >60 mL/min (ref >60)
GFR calc non Af Amer: >60 mL/min (ref >60)
Glucose, Bld: 67 mg/dL — ABNORMAL LOW (ref 70–99)
Potassium: 3.2 mmol/L — ABNORMAL LOW (ref 3.5–5.1)
Sodium: 139 mmol/L (ref 135–145)
Total Bilirubin: UNDETERMINED mg/dL (ref 0.3–1.2)
Total Protein: 5.4 g/dL — ABNORMAL LOW (ref 6.5–8.1)

## 2019-07-07 LAB — MAGNESIUM: Magnesium: 1.3 mg/dL — ABNORMAL LOW (ref 1.7–2.4)

## 2019-07-07 LAB — LACTIC ACID, PLASMA
Lactic Acid, Venous: 3.4 mmol/L (ref 0.5–1.9)
Lactic Acid, Venous: 3.5 mmol/L (ref 0.5–1.9)
Lactic Acid, Venous: 3.4 mmol/L (ref 0.5–1.9)
Lactic Acid, Venous: 2.8 mmol/L (ref 0.5–1.9)

## 2019-07-07 MED ORDER — INSULIN ASPART 100 UNIT/ML ~~LOC~~ SOLN
0.0000 [IU] | Freq: Every day | SUBCUTANEOUS | Status: DC
  Administered 2019-07-07: 2 [IU] via SUBCUTANEOUS

## 2019-07-07 MED ORDER — INFLUENZA VAC SPLIT QUAD 0.5 ML IM SUSY
0.5000 mL | PREFILLED_SYRINGE | INTRAMUSCULAR | Status: AC
  Administered 2019-07-08: 0.5 mL via INTRAMUSCULAR
  Filled 2019-07-07: qty 0.5

## 2019-07-07 MED ORDER — MAGNESIUM SULFATE 4 GM/100ML IV SOLN
4.0000 g | Freq: Once | INTRAVENOUS | Status: AC
  Administered 2019-07-07: 4 g via INTRAVENOUS
  Filled 2019-07-07: qty 100

## 2019-07-07 MED ORDER — POTASSIUM CHLORIDE CRYS ER 20 MEQ PO TBCR
40.0000 meq | EXTENDED_RELEASE_TABLET | ORAL | Status: AC
  Administered 2019-07-07 (×2): 40 meq via ORAL
  Filled 2019-07-07 (×2): qty 2

## 2019-07-07 MED ORDER — CLONIDINE HCL 0.1 MG PO TABS
0.1000 mg | ORAL_TABLET | Freq: Two times a day (BID) | ORAL | Status: DC
  Administered 2019-07-07 – 2019-07-08 (×3): 0.1 mg via ORAL
  Filled 2019-07-07 (×3): qty 1

## 2019-07-07 MED ORDER — INSULIN ASPART 100 UNIT/ML ~~LOC~~ SOLN: 0.0000 [IU] | Freq: Three times a day (TID) | SUBCUTANEOUS | Status: DC

## 2019-07-07 MED ORDER — LABETALOL HCL 5 MG/ML IV SOLN: 10.0000 mg | INTRAVENOUS | Status: DC | PRN

## 2019-07-07 MED ORDER — LACTATED RINGERS IV BOLUS
1000.0000 mL | Freq: Once | INTRAVENOUS | Status: AC
  Administered 2019-07-07: 1000 mL via INTRAVENOUS

## 2019-07-07 NOTE — Care Plan (Signed)
Reviewed chart, received 2mg  ativan. Per RN its for elevated heart rate per CIWA protocol. No seizures overnight.   - Continue keppra 500mg  BID - DO not drive for 6 months/until cleared by physician  Per [facility] statutes, patients with seizures are not allowed to drive until they have been seizure-free for six months. Patient acknowledged,repeated, and understands.    Use caution when using heavy equipment or power tools. Avoid working on ladders or at heights. Take showers instead of baths. Ensure the water temperature is not too high on the home water heater. Do not go swimming alone. Do not lock yourself in a room alone (i.e. bathroom). When caring for infants or small children, sit down when holding, feeding, or changing them to minimize risk of injury to the child in the event you have a seizure. Maintain good sleep hygiene. Avoid alcohol.    If patient has another seizure, call 911 and bring them back to the ED if: A.  Any seizure-like activity or alteration of mentation or LOC - especially if > 5 mins.      B.  The patient doesn't wake shortly after the seizure or has new problems such as difficulty seeing, speaking or moving following the seizure C.  The patient was injured during the seizure D.  The patient has a temperature over 102 F (39C) E.  The patient vomited during the seizure and now is having trouble breathing   During the Seizure   - First, ensure adequate ventilation and place patients on the floor on their left side  Loosen clothing around the neck and ensure the airway is patent. If the patient is clenching the teeth, do not force the mouth open with any object as this can cause severe damage - Remove all items from the surrounding that can be hazardous. The patient may be oblivious to what's happening and may not even know what he or she is doing. If the patient is confused and wandering, either gently guide him/her away and block access to outside areas -  Reassure the individual and be comforting - Call 911. In most cases, the seizure ends before EMS arrives. However, there are cases when seizures may last over 3 to 5 minutes. Or the individual may have developed breathing difficulties or severe injuries. If a pregnant patient or a person with diabetes develops a seizure, it is prudent to call an ambulance. - Finally, if the patient does not regain full consciousness, then call EMS. Most patients will remain confused for about 45 to 90 minutes after a seizure, so you must use judgment in calling for help. - Avoid restraints but make sure the patient is in a bed with padded side rails - Place the individual in a lateral position with the neck slightly flexed; this will help the saliva drain from the mouth and prevent the tongue from falling backward - Remove all nearby furniture and other hazards from the area - Provide verbal assurance as the individual is regaining consciousness - Provide the patient with privacy if possible - Call for help and start treatment as ordered by the caregiver    After the Seizure (Postictal Stage)   After a seizure, most patients experience confusion, fatigue, muscle pain and/or a headache. Thus, one should permit the individual to sleep. For the next few days, reassurance is essential. Being calm and helping reorient the person is also of importance.   Most seizures are painless and end spontaneously. Seizures are not harmful to  others but can lead to complications such as stress on the lungs, brain and the heart. Individuals with prior lung problems may develop labored breathing and respiratory distress.   Kristien Salatino Barbra Sarks

## 2019-07-07 NOTE — Plan of Care (Signed)

## 2019-07-07 NOTE — Progress Notes (Signed)
Inpatient Diabetes Program Recommendations  AACE/ADA: New Consensus Statement on Inpatient Glycemic Control (2015)  Target Ranges:  Prepandial:   less than 140 mg/dL      Peak postprandial:   less than 180 mg/dL (1-2 hours)      Critically ill patients:  140 - 180 mg/dL   Lab Results  Component Value Date   GLUCAP 213 (H) 07/07/2019   HGBA1C 8.9 (H) 07/06/2019    Review of Glycemic Control  Diabetes history: DM2 since 2011 Outpatient Diabetes medications: Lantus 25 units QHS, Novolog 09-10-08 units tidwc Current orders for Inpatient glycemic control: Novolog 0-6 units tidwc and 0-5 units QHS  HgbA1C - 8.9% - up from 6.9% on 11/23/18. DKA on admission. Had not taken insulin in 2 days. Transitioned off insulin drip to Lantus 28 units QHS. Hypo this am: 67, 29, 123, 54 mg/dL. Hyperglycemia at HS: 248, 213 mg/dL  Inpatient Diabetes Program Recommendations:     If FBS > 180 mg/dL, add Lantus 12 units QHS Need daily titration for goal of 140-180 mg/dL.  Spoke with pt about HgbA1C of 8.9%. Pt was groggy, but said he always takes his insulin at home. Did not reply as to whether he was checking his blood sugars each day. Said blood sugars were usually high, not low. Instructed to f/u with PCP for close diabetes management.  Continue to follow.  Thank you. Lorenda Peck, RD, LDN, CDE Inpatient Diabetes Coordinator [phone]

## 2019-07-07 NOTE — Progress Notes (Signed)
CRITICAL VALUE ALERT  Critical Value:  Lactic acid 3.4  Date & Time Notied:  07/07/19 0150  Provider Notified: Lamar Laundry, RN

## 2019-07-07 NOTE — Progress Notes (Signed)
PROGRESS NOTE  Willie [name] [id number] DOB: Aug 23, 1975   PCP: Debbrah Alar, NP  Patient is from: Home.  DOA: 07/06/2019 LOS: 0  Brief Narrative / Interim history: 44 year old male with history of OSA not on CPAP, HTN, DM-2, SDH s/p craniectomy in 12/2018, seizure, EtOH abuse and depression presented with seizure, and admitted with DKA, seizure and lactic acidosis.  Reportedly reportedly had seizure about 2 days prior to arrival.  He was drinking at that time.  Witnessed seizure by EMS and in ED and was given Versed and Ativan respectively.  Labs consistent with DKA but negative BHA.  LA > 11.  K3.3.  EtOH level > 143.  Loaded with Keppra.  Neurology consulted and admitted for DKA and seizure.  Subjective: Has had hypoglycemic events last night and this morning.  CBG down to 29 requiring D50.  Also tachycardic and hypertensive.  No complaints.  He is oriented x4 except date but is slow.  He denies headache, neck stiffness, chest pain, dyspnea, GI or UTI symptoms.  Objective: Vitals:   07/07/19 0743 07/07/19 0900 07/07/19 1000 07/07/19 1108  BP:    (!) 131/94  Pulse: (!) 111 (!) 108 (!) 103 (!) 116  Resp:   15   Temp: 99.2 F (37.3 C)   99 F (37.2 C)  TempSrc: Oral   Oral  SpO2: 99% 98% 99% 97%    Intake/Output Summary (Last 24 hours) at 07/07/2019 1124 Last data filed at 07/07/2019 1027 Gross per 24 hour  Intake 5476.08 ml  Output 2550 ml  Net 2926.08 ml   There were no vitals filed for this visit.  Examination:  GENERAL: No acute distress.  Appears well.  HEENT: MMM.  Vision and hearing grossly intact.  NECK: Supple.  No apparent JVD.  RESP:  No IWOB. Good air movement bilaterally. CVS:  RRR. Heart sounds normal.  ABD/GI/GU: Bowel sounds present. Soft. Non tender.  MSK/EXT:  Moves extremities. No apparent deformity. No edema.  SKIN: no apparent skin lesion or wound NEURO: Awake, alert and oriented appropriately but is slow.  No apparent focal neuro  deficit. PSYCH: Calm. Normal affect.  Procedures:  3/9-EEG-mild diffuse encephalopathy and "excessive beta, generalized" but no seizure or epileptiform discharge.  Assessment & Plan: DKA/uncontrolled DM-2 with hypoglycemia and hyperglycemia: Likely due to noncompliance with meds and diet.  Heavy alcohol use.  A1c 8.9.  Has been transitioned to subcu insulin. Recent Labs    07/07/19 0740 07/07/19 0805 07/07/19 1105  GLUCAP 29* 123* 54*  -Reduce SSI to very thin.  Discontinue basal insulin -D50 as needed -Continue statin  Seizure activity-could be due to alcohol and noncompliance with his Keppra.  Has history of SDH which would also increase his likelihood of ongoing seizure.  EEG without seizure or epileptiform discharge. -Appreciate neurology guidance-no driving for 6 months, seizure precautions and Keppra 500 mg twice daily -He is on as needed Ativan for alcohol withdrawal. -Continue CIWA precautions  Alcohol use disorder/abuse/withdrawal: EtOH level 143 on admission.  Now with tachycardia and elevated blood pressures.  -Add clonidine 0.1 mg twice daily -Continue CIWA with as needed Ativan -Continue vitamins including thiamine -Monitor electrolytes and replenish aggressively  Essential hypertension: BP elevated. -Continue Norvasc -Added clonidine and as needed labetalol.  History of SDH s/p craniotomy in 12/2018-CT head without acute finding but chronic changes due to prior subdural hematoma. -Describes drinking -Encourage compliance with his medications  OSA not on CPAP  Lactic acidosis: Likely due to DKA seizure and alcohol.  Improving. -Recheck until it resolves  Depression: Stable. -Continue Lexapro and Seroquel  Hypokalemia/hypomagnesemia -Replenish and recheck               DVT prophylaxis: Subcu Lovenox Code Status: Full code Family Communication: Patient and/or RN. Available if any question.   Discharge barrier: Significant electrolyte derangement,  hemodynamic instability and hypoglycemia Patient is from: Home Final disposition: Likely home when medically stable  Consultants: Neurology   Microbiology summarized: COVID-19 negative  Sch Meds:  Scheduled Meds: . amLODipine  5 mg Oral Daily  . cloNIDine  0.1 mg Oral BID  . diazepam  5 mg Oral Q6H  . docusate sodium  100 mg Oral BID  . enoxaparin (LOVENOX) injection  40 mg Subcutaneous Q24H  . escitalopram  20 mg Oral Daily  . folic acid  1 mg Oral Daily  . [START ON 07/08/2019] influenza vac split quadrivalent PF  0.5 mL Intramuscular Tomorrow-1000  . insulin aspart  0-5 Units Subcutaneous QHS  . insulin aspart  0-6 Units Subcutaneous TID WC  . levETIRAcetam  500 mg Oral BID  . multivitamin with minerals  1 tablet Oral Daily  . pantoprazole  40 mg Oral BID  . potassium chloride  40 mEq Oral Q4H  . QUEtiapine  300 mg Oral QHS  . thiamine  100 mg Oral Daily   Or  . thiamine  100 mg Intravenous Daily   Continuous Infusions: . sodium chloride 100 mL/hr at 07/07/19 0853   PRN Meds:.acetaminophen **OR** acetaminophen, dextrose, labetalol, LORazepam, LORazepam **OR** LORazepam, ondansetron **OR** ondansetron (ZOFRAN) IV, polyethylene glycol  Antimicrobials: Anti-infectives (From admission, onward)   None       I have personally reviewed the following labs and images: CBC: Recent Labs  Lab 07/06/19 1205 07/06/19 1212 07/07/19 0327  WBC 10.0  --  9.4  NEUTROABS 9.2*  --   --   HGB 14.1 15.3 10.7*  HCT 41.8 45.0 31.1*  MCV 97.0  --  95.4  PLT 337  --  196   BMP &GFR Recent Labs  Lab 07/06/19 1205 07/06/19 1212 07/06/19 1534 07/06/19 1825 07/07/19 0327 07/07/19 0806  NA 136 134* 145 138 139  --   K 3.7 3.7 5.6* 4.2 3.2*  --   CL 90*  --  101 100 101  --   CO2 13*  --  22 23 23   --   GLUCOSE 603*  --  120* 190* 67*  --   BUN 5*  --  5* <5* <5*  --   CREATININE 1.10  --  0.90 0.73 0.81  --   CALCIUM 9.2  --  9.2 8.1* 8.4*  --   MG  --   --   --   --   --   1.3*   Estimated Creatinine Clearance: 122 mL/min (by C-G formula based on SCr of 0.81 mg/dL). Liver & Pancreas: Recent Labs  Lab 07/06/19 1205 07/07/19 0327  AST 111* 250*  ALT 69* QUANTITY NOT SUFFICIENT, UNABLE TO PERFORM TEST  ALKPHOS 250* 155*  BILITOT 0.9 QUANTITY NOT SUFFICIENT, UNABLE TO PERFORM TEST  PROT 7.6 5.4*  ALBUMIN 3.7 2.7*   No results for input(s): LIPASE, AMYLASE in the last 168 hours. No results for input(s): AMMONIA in the last 168 hours. Diabetic: Recent Labs    07/06/19 1535  HGBA1C 8.9*   Recent Labs  Lab 07/06/19 2239 07/06/19 2328 07/07/19 0740 07/07/19 0805 07/07/19 1105  GLUCAP 110* 130* 29* 123* 54*  Cardiac Enzymes: No results for input(s): CKTOTAL, CKMB, CKMBINDEX, TROPONINI in the last 168 hours. No results for input(s): PROBNP in the last 8760 hours. Coagulation Profile: No results for input(s): INR, PROTIME in the last 168 hours. Thyroid Function Tests: No results for input(s): TSH, T4TOTAL, FREET4, T3FREE, THYROIDAB in the last 72 hours. Lipid Profile: No results for input(s): CHOL, HDL, LDLCALC, TRIG, CHOLHDL, LDLDIRECT in the last 72 hours. Anemia Panel: No results for input(s): VITAMINB12, FOLATE, FERRITIN, TIBC, IRON, RETICCTPCT in the last 72 hours. Urine analysis:    Component Value Date/Time   COLORURINE STRAW (A) 07/06/2019 1216   APPEARANCEUR CLEAR 07/06/2019 1216   LABSPEC 1.025 07/06/2019 1216   PHURINE 5.0 07/06/2019 1216   GLUCOSEU >=500 (A) 07/06/2019 1216   HGBUR NEGATIVE 07/06/2019 1216   BILIRUBINUR NEGATIVE 07/06/2019 1216   KETONESUR NEGATIVE 07/06/2019 1216   PROTEINUR NEGATIVE 07/06/2019 1216   UROBILINOGEN 0.2 11/17/2014 1315   NITRITE NEGATIVE 07/06/2019 1216   LEUKOCYTESUR NEGATIVE 07/06/2019 1216   Sepsis Labs: Invalid input(s): PROCALCITONIN, [place]  Microbiology: Recent Results (from the past 240 hour(s))  SARS CORONAVIRUS 2 (TAT 6-24 HRS) Nasopharyngeal Nasopharyngeal Swab     Status:  None   Collection Time: 07/06/19 12:37 PM   Specimen: Nasopharyngeal Swab  Result Value Ref Range Status   SARS Coronavirus 2 NEGATIVE NEGATIVE Final    Comment: (NOTE) SARS-CoV-2 target nucleic acids are NOT DETECTED. The SARS-CoV-2 RNA is generally detectable in upper and lower respiratory specimens during the acute phase of infection. Negative results do not preclude SARS-CoV-2 infection, do not rule out co-infections with other pathogens, and should not be used as the sole basis for treatment or other patient management decisions. Negative results must be combined with clinical observations, patient history, and epidemiological information. The expected result is Negative. Fact Sheet for Patients: SugarRoll.be Fact Sheet for Healthcare Providers: https://www.woods-mathews.com/ This test is not yet approved or cleared by the [place] FDA and  has been authorized for detection and/or diagnosis of SARS-CoV-2 by FDA under an Emergency Use Authorization (EUA). This EUA will remain  in effect (meaning this test can be used) for the duration of the COVID-19 declaration under Section 56 4(b)(1) of the Act, 21 U.S.C. section 360bbb-3(b)(1), unless the authorization is terminated or revoked sooner. Performed at [facility], [place] [address], [place], [place] [address]     Radiology Studies: EEG  Result Date: 07/06/2019 Lora Havens, MD     07/06/2019  4:38 PM Patient Name: KEIGEN PLAUT MRN: [id number] Epilepsy Attending: Lora Havens Referring Physician/Provider: Dr Zeb Comfort Date: 07/06/2019 Duration: 27.54 mins Patient history: 44 year old male with history of alcohol abuse, uncontrolled diabetes and prior bilateral subdurals who presented with seizures x3. EEG to evaluate for seizure. Level of alertness: awake AEDs during EEG study: ativan, keppra Technical aspects: This EEG study was done with scalp electrodes positioned  according to the 10-20 International system of electrode placement. Electrical activity was acquired at a sampling rate of 500Hz  and reviewed with a high frequency filter of 70Hz  and a low frequency filter of 1Hz . EEG data were recorded continuously and digitally stored. DESCRIPTION: No clear posterior dominant rhythm was seen. EEG showed contiguous generalized 5-7hz  theta slowing admixed with 13-15hz  generalized beta activity. Hyperventilation and photic stimulation were not performed. ABNORMALITY - Continuous slow, generalized - Excessive beta, generalized IMPRESSION: This study is suggestive of mild diffuse encepalopathy, non specific to etiology. The excessive beta activity seen in the background is most  likely due to the effect of benzodiazepine and is a benign EEG pattern. No seizures or epileptiform discharges were seen throughout the recording. Lora Havens   CT Head Wo Contrast  Result Date: 07/06/2019 CLINICAL DATA:  Seizure. Alcohol/drug related. History of craniotomy EXAM: CT HEAD WITHOUT CONTRAST TECHNIQUE: Contiguous axial images were obtained from the base of the skull through the vertex without intravenous contrast. COMPARISON:  CT head 06/25/2019 FINDINGS: Brain: Chronic dural thickening left frontal region is unchanged due to chronic subdural hematoma. No acute hemorrhage. No acute infarct or mass. Mild atrophy and mild chronic white matter changes. Vascular: Negative for hyperdense vessel. Skull: Bilateral chronic craniotomy.  No acute skeletal abnormality. Sinuses/Orbits: Negative Other: None IMPRESSION: No acute abnormality no change from the prior CT Atrophy and chronic left frontal dural thickening due to prior subdural hematoma. Electronically Signed   By: Franchot Gallo M.D.   On: 07/06/2019 13:10   DG Chest Port 1 View  Result Date: 07/06/2019 CLINICAL DATA:  Seizure EXAM: PORTABLE CHEST 1 VIEW COMPARISON:  01/09/2019 FINDINGS: Single frontal view of the chest demonstrates an  unremarkable cardiac silhouette. No airspace disease, effusion, or pneumothorax. No acute bony abnormality. IMPRESSION: No active disease. Electronically Signed   By: Randa Ngo M.D.   On: 07/06/2019 13:40   35 minutes with more than 50% spent in reviewing records, counseling patient/family and coordinating care.   Rashod Gougeon T. [place]  If 7PM-7AM, please contact night-coverage [email or web address] Password [facility] 07/07/2019, 11:24 AM

## 2019-07-08 DIAGNOSIS — E1110 Type 2 diabetes mellitus with ketoacidosis without coma: Secondary | ICD-10-CM | POA: Diagnosis not present

## 2019-07-08 DIAGNOSIS — I10 Essential (primary) hypertension: Secondary | ICD-10-CM | POA: Diagnosis not present

## 2019-07-08 DIAGNOSIS — G4733 Obstructive sleep apnea (adult) (pediatric): Secondary | ICD-10-CM | POA: Diagnosis not present

## 2019-07-08 DIAGNOSIS — F10239 Alcohol dependence with withdrawal, unspecified: Secondary | ICD-10-CM | POA: Diagnosis not present

## 2019-07-08 LAB — CBC
HCT: 31.1 % — ABNORMAL LOW (ref 39.0–52.0)
Hemoglobin: 10.7 g/dL — ABNORMAL LOW (ref 13.0–17.0)
MCH: 32.7 pg (ref 26.0–34.0)
MCHC: 34.4 g/dL (ref 30.0–36.0)
MCV: 95.1 fL (ref 80.0–100.0)
Platelets: 175 10*3/uL (ref 150–400)
RBC: 3.27 MIL/uL — ABNORMAL LOW (ref 4.22–5.81)
RDW: 12.4 % (ref 11.5–15.5)
WBC: 9.4 10*3/uL (ref 4.0–10.5)
nRBC: 0 % (ref 0.0–0.2)

## 2019-07-08 LAB — RENAL FUNCTION PANEL
Albumin: 2.6 g/dL — ABNORMAL LOW (ref 3.5–5.0)
Anion gap: 10 (ref 5–15)
BUN: <5 mg/dL — ABNORMAL LOW (ref 6–20)
CO2: 24 mmol/L (ref 22–32)
Calcium: 8.3 mg/dL — ABNORMAL LOW (ref 8.9–10.3)
Chloride: 102 mmol/L (ref 98–111)
Creatinine, Ser: 0.72 mg/dL (ref 0.61–1.24)
GFR calc Af Amer: >60 mL/min (ref >60)
GFR calc non Af Amer: >60 mL/min (ref >60)
Glucose, Bld: 163 mg/dL — ABNORMAL HIGH (ref 70–99)
Phosphorus: 3.2 mg/dL (ref 2.5–4.6)
Potassium: 3.6 mmol/L (ref 3.5–5.1)
Sodium: 136 mmol/L (ref 135–145)

## 2019-07-08 LAB — MAGNESIUM: Magnesium: 1.7 mg/dL (ref 1.7–2.4)

## 2019-07-08 LAB — GLUCOSE, CAPILLARY
Glucose-Capillary: 110 mg/dL — ABNORMAL HIGH (ref 70–99)
Glucose-Capillary: 233 mg/dL — ABNORMAL HIGH (ref 70–99)

## 2019-07-08 MED ORDER — AMLODIPINE BESYLATE 10 MG PO TABS
10.0000 mg | ORAL_TABLET | Freq: Every day | ORAL | Status: DC
  Administered 2019-07-08: 10 mg via ORAL
  Filled 2019-07-08: qty 1

## 2019-07-08 MED ORDER — CHLORDIAZEPOXIDE HCL 25 MG PO CAPS
25.0000 mg | ORAL_CAPSULE | Freq: Three times a day (TID) | ORAL | Status: DC
  Administered 2019-07-08 (×2): 25 mg via ORAL
  Filled 2019-07-08 (×2): qty 1

## 2019-07-08 MED ORDER — CHLORDIAZEPOXIDE HCL 25 MG PO CAPS: 25.0000 mg | ORAL_CAPSULE | ORAL | Status: DC

## 2019-07-08 MED ORDER — INSULIN LISPRO (1 UNIT DIAL) 100 UNIT/ML (KWIKPEN): 9.0000 [IU] | PEN_INJECTOR | Freq: Three times a day (TID) | SUBCUTANEOUS | 3 refills | Status: AC

## 2019-07-08 MED ORDER — MAGNESIUM SULFATE 2 GM/50ML IV SOLN
2.0000 g | Freq: Once | INTRAVENOUS | Status: AC
  Administered 2019-07-08: 2 g via INTRAVENOUS
  Filled 2019-07-08: qty 50

## 2019-07-08 MED ORDER — LEVETIRACETAM 500 MG PO TABS: 500.0000 mg | ORAL_TABLET | Freq: Two times a day (BID) | ORAL | 1 refills | Status: AC

## 2019-07-08 MED ORDER — LANTUS SOLOSTAR 100 UNIT/ML ~~LOC~~ SOPN: 15.0000 [IU] | PEN_INJECTOR | Freq: Every day | SUBCUTANEOUS | 1 refills | Status: DC

## 2019-07-08 MED ORDER — CHLORDIAZEPOXIDE HCL 25 MG PO CAPS: 25.0000 mg | ORAL_CAPSULE | Freq: Every day | ORAL | Status: DC

## 2019-07-08 MED ORDER — AMLODIPINE BESYLATE 10 MG PO TABS: 10.0000 mg | ORAL_TABLET | Freq: Every day | ORAL | 1 refills | Status: AC

## 2019-07-08 MED ORDER — POTASSIUM CHLORIDE CRYS ER 20 MEQ PO TBCR
40.0000 meq | EXTENDED_RELEASE_TABLET | Freq: Once | ORAL | Status: AC
  Administered 2019-07-08: 40 meq via ORAL
  Filled 2019-07-08: qty 2

## 2019-07-08 MED ORDER — ATORVASTATIN CALCIUM 20 MG PO TABS: 20.0000 mg | ORAL_TABLET | Freq: Every day | ORAL | 1 refills | Status: AC

## 2019-07-08 MED ORDER — GLUCAGON (RDNA) 1 MG IJ KIT: PACK | INTRAMUSCULAR | 1 refills | Status: AC

## 2019-07-08 MED ORDER — THIAMINE HCL 100 MG PO TABS: 100.0000 mg | ORAL_TABLET | Freq: Every day | ORAL | 1 refills | Status: AC

## 2019-07-08 MED ORDER — CHLORDIAZEPOXIDE HCL 25 MG PO CAPS: ORAL_CAPSULE | ORAL | 0 refills | Status: AC

## 2019-07-08 MED FILL — CHLORDIAZEPOXIDE 25 MG CAP: 25 | 6 days supply | Qty: 12 | Fill #0

## 2019-07-08 MED FILL — AMLODIPINE BESYLATE 10 MG T: 10 | 30 days supply | Qty: 30 | Fill #0

## 2019-07-08 MED FILL — VITAMIN B-1 100 MG TABS: 100 | 30 days supply | Qty: 30 | Fill #0

## 2019-07-08 MED FILL — ATORVASTATIN CALCIUM 20 MG: 20 | 30 days supply | Qty: 30 | Fill #0

## 2019-07-08 NOTE — Discharge Summary (Signed)
Physician Discharge Summary  DAVARIS YOUTSEY GLO:[id number] DOB: Sep 21, 1975 DOA: 07/06/2019  PCP: Debbrah Alar, NP  Admit date: 07/06/2019 Discharge date: 07/08/2019  Admitted From: Home Disposition: Home  Recommendations for Outpatient Follow-up:  1. Follow ups as below. 2. Please obtain CBC/BMP/Mag at follow up 3. Please follow up on the following pending results: None  Home Health: None Equipment/Devices: None  Discharge Condition: Stable CODE STATUS: Full code  Follow-up Information    Debbrah Alar, NP. Schedule an appointment as soon as possible for a visit in 1 week(s).   Specialty: Internal Medicine Contact information: [address] [place] [address] [phone]           Hospital Course: 44 year old male with history of OSA not on CPAP, HTN, DM-2, SDH s/p craniectomy in 12/2018, seizure, EtOH abuse and depression presented with seizure, and admitted with DKA, seizure and lactic acidosis.  Reportedly reportedly had seizure about 2 days prior to arrival.  He was drinking at that time.  Witnessed seizure by EMS and in ED and was given Versed and Ativan respectively.  Labs consistent with DKA but negative BHA.  LA > 11.  K3.3.  EtOH level > 143.  Loaded with Keppra.  Neurology consulted and admitted for DKA and seizure.  DKA resolved.  Transitioned to subcu insulin.  Had some hypoglycemic events that has resolved after adjustment to his insulin regimen.  In regards to his seizure, EEG with mild diffuse encephalopathy and "excessive beta, generalized" but no seizure or epileptiform discharge. Evaluated by neurology for seizure who recommended Keppra 500 mg twice daily.  He was advised not to drive for 6 months and counseled on seizure precaution.  He was also advised to quit drinking alcohol.   In regards to his alcohol, he is CIWA remains low.  Discharge on Librium taper.  He already has as needed Ativan.   Patient was evaluated by  PT and no need was identified.  Discharge Diagnoses:  DKA/uncontrolled DM-2 with hypoglycemia and hyperglycemia: Likely due to noncompliance with meds and diet.  Heavy alcohol use.  A1c 8.9.  Has been transitioned to subcu insulin. Recent Labs    07/07/19 2127 07/08/19 0758 07/08/19 1124  GLUCAP 213* 110* 233*  -Discharged on Lantus 15 units nightly and NovoLog 9 units AC -Continue statin -Was given prescription for glucagon -Adjust insulin as appropriate at follow-up.  Seizure activity-could be due to alcohol and noncompliance with his Keppra.  Has history of SDH which would also increase his likelihood of ongoing seizure.  EEG without seizure or epileptiform discharge. -No driving for 6 months, seizure precautions and Keppra 500 mg twice daily  Alcohol use disorder/abuse/withdrawal: EtOH level 143 on admission. Tachycardia and blood pressure improved. -Counseled on cessation. -Discharged on Librium taper as above.  Essential hypertension: BP within fair range. -Increase home amlodipine to 10 mg daily.  History of SDH s/p craniotomy in 12/2018-CT head without acute finding but chronic changes due to prior subdural hematoma. -Counseled on alcohol cessation -Encourage compliance with his medications  OSA not on CPAP  Lactic acidosis: Likely due to DKA seizure and alcohol.   Depression: Stable. -Continue Lexapro and Seroquel  Hypokalemia/hypomagnesemia: Replenished have resolved.   Discharge Instructions  Discharge Instructions    Call MD for:  difficulty breathing, headache or visual disturbances   Complete by: As directed    Call MD for:  extreme fatigue   Complete by: As directed    Call MD for:  persistant  dizziness or light-headedness   Complete by: As directed    Call MD for:  persistant nausea and vomiting   Complete by: As directed    Diet - low sodium heart healthy   Complete by: As directed    Diet Carb Modified   Complete by: As directed     Discharge instructions   Complete by: As directed    It has been a pleasure taking care of you! You were hospitalized due to seizure, alcohol intoxication and withdrawal and diabetic ketoacidosis.  We believe the seizure and diabetic ketoacidosis are likely due to alcohol.  We strongly recommend you quit drinking alcohol.  We have given you prescription to prevent alcohol withdrawal.  We also encourage you to discuss treatment options with your primary care doctor to remain sober from alcohol.  We encourage you to utilize the resources to be given.  In regards to seizure, please take you Keppra as prescribed.  We recommend not driving for the next 6 months until you are cleared by your primary care doctor or neurologist.  We also recommend not swimming alone.  See separate instruction about seizure. In regards to your diabetes, we have made adjustments to your home insulin.  You may contact your primary care doctor for further direction of your insulin if your blood glucose is too high or too low.   Please review your new medication list and the directions before you take your medications.  Please follow-up with your primary care doctor in 1 week.   Take care,   Increase activity slowly   Complete by: As directed      Allergies as of 07/08/2019      Reactions   Invokamet [canagliflozin-metformin Hcl] Other (See Comments)   Lactic acidosis   Metformin And Related Other (See Comments)   DRASTIC drop in blood sugar      Medication List    STOP taking these medications   ondansetron 4 MG disintegrating tablet Commonly known as: Zofran ODT     TAKE these medications   amLODipine 10 MG tablet Commonly known as: NORVASC Take 1 tablet (10 mg total) by mouth daily. What changed:   medication strength  how much to take   atorvastatin 20 MG tablet Commonly known as: Lipitor Take 1 tablet (20 mg total) by mouth daily.   blood glucose meter kit and supplies Kit Dispense based on  patient and insurance preference. Use up to four times daily as directed. (FOR ICD-9 250.00, 250.01).   chlordiazePOXIDE 25 MG capsule Commonly known as: LIBRIUM Take 1 capsule 3 times a day for 2 days, then 2 times a day for 2 days and then 1 times a day for 2 days   escitalopram 20 MG tablet Commonly known as: LEXAPRO TAKE ONE TABLET BY MOUTH DAILY   folic acid 1 MG tablet Commonly known as: FOLVITE TAKE ONE TABLET BY MOUTH DAILY   freestyle lancets 2 (two) times daily   FreeStyle Libre 14 Day Sensor Misc Inject 1 each into the skin every 14 (fourteen) days.   glucagon 1 MG injection Follow package directions for low blood sugar.   glucose blood test strip Commonly known as: FREESTYLE LITE Use as instructed   insulin lispro 100 UNIT/ML KwikPen Commonly known as: HumaLOG KwikPen Inject 0.09 mLs (9 Units total) into the skin 3 (three) times daily before meals. What changed:   how much to take  how to take this  when to take this  additional instructions  Insulin Pen Needle 31G X 8 MM Misc Commonly known as: UltiCare Short Pen Needles Use once daily to inject insulin.   Lantus SoloStar 100 UNIT/ML Solostar Pen Generic drug: insulin glargine Inject 15 Units into the skin at bedtime. What changed: how much to take   levETIRAcetam 500 MG tablet Commonly known as: KEPPRA Take 1 tablet (500 mg total) by mouth 2 (two) times daily.   LORazepam 1 MG tablet Commonly known as: ATIVAN TAKE ONE TABLET BY MOUTH EVERY 8 HOURS AS NEEDED FOR ANXIETY What changed:   reasons to take this  additional instructions   multivitamin tablet Take 1 tablet by mouth daily.   pantoprazole 40 MG tablet Commonly known as: PROTONIX Take 1 tablet (40 mg total) by mouth 2 (two) times daily.   QUEtiapine 300 MG tablet Commonly known as: SEROQUEL TAKE ONE TABLET BY MOUTH AT BEDTIME   sildenafil 20 MG tablet Commonly known as: REVATIO TAKE 1-2 TABLETS BY MOUTH PRIOR TO SEXUAL  ACTIVITY What changed:   how much to take  how to take this  when to take this  additional instructions   thiamine 100 MG tablet Take 1 tablet (100 mg total) by mouth daily. Start taking on: July 09, 2019       Consultations:  Neurology  Procedures/Studies:  3/9-EEG-mild diffuse encephalopathy and "excessive beta, generalized" but no seizure or epileptiform discharge.   EEG  Result Date: 07/06/2019 Lora Havens, MD     07/06/2019  4:38 PM Patient Name: Willie [name] MRN: [id number] Epilepsy Attending: Lora Havens Referring Physician/Provider: Dr Zeb Comfort Date: 07/06/2019 Duration: 27.54 mins Patient history: 44 year old male with history of alcohol abuse, uncontrolled diabetes and prior bilateral subdurals who presented with seizures x3. EEG to evaluate for seizure. Level of alertness: awake AEDs during EEG study: ativan, keppra Technical aspects: This EEG study was done with scalp electrodes positioned according to the 10-20 International system of electrode placement. Electrical activity was acquired at a sampling rate of '500Hz'  and reviewed with a high frequency filter of '70Hz'  and a low frequency filter of '1Hz' . EEG data were recorded continuously and digitally stored. DESCRIPTION: No clear posterior dominant rhythm was seen. EEG showed contiguous generalized 5-'7hz'  theta slowing admixed with 13-'15hz'  generalized beta activity. Hyperventilation and photic stimulation were not performed. ABNORMALITY - Continuous slow, generalized - Excessive beta, generalized IMPRESSION: This study is suggestive of mild diffuse encepalopathy, non specific to etiology. The excessive beta activity seen in the background is most likely due to the effect of benzodiazepine and is a benign EEG pattern. No seizures or epileptiform discharges were seen throughout the recording. Lora Havens   CT Head Wo Contrast  Result Date: 07/06/2019 CLINICAL DATA:  Seizure. Alcohol/drug related. History  of craniotomy EXAM: CT HEAD WITHOUT CONTRAST TECHNIQUE: Contiguous axial images were obtained from the base of the skull through the vertex without intravenous contrast. COMPARISON:  CT head 06/25/2019 FINDINGS: Brain: Chronic dural thickening left frontal region is unchanged due to chronic subdural hematoma. No acute hemorrhage. No acute infarct or mass. Mild atrophy and mild chronic white matter changes. Vascular: Negative for hyperdense vessel. Skull: Bilateral chronic craniotomy.  No acute skeletal abnormality. Sinuses/Orbits: Negative Other: None IMPRESSION: No acute abnormality no change from the prior CT Atrophy and chronic left frontal dural thickening due to prior subdural hematoma. Electronically Signed   By: Franchot Gallo M.D.   On: 07/06/2019 13:10   CT Head Wo Contrast  Result Date: 06/25/2019 CLINICAL DATA:  Seizure EXAM: CT HEAD WITHOUT CONTRAST TECHNIQUE: Contiguous axial images were obtained from the base of the skull through the vertex without intravenous contrast. COMPARISON:  CT head 04/21/2019 FINDINGS: Brain: Chronic dural thickening in the left frontal region with small chronic extra-axial fluid collection is unchanged. No acute hemorrhage or mass. Ventricle size normal. No acute infarct. Vascular: Negative for hyperdense vessel Skull: Chronic bilateral craniotomy. Sinuses/Orbits: Negative Other: None IMPRESSION: No acute abnormality no interval change. Chronic dural thickening on the left is stable. Electronically Signed   By: Franchot Gallo M.D.   On: 06/25/2019 12:03   DG Chest Port 1 View  Result Date: 07/06/2019 CLINICAL DATA:  Seizure EXAM: PORTABLE CHEST 1 VIEW COMPARISON:  01/09/2019 FINDINGS: Single frontal view of the chest demonstrates an unremarkable cardiac silhouette. No airspace disease, effusion, or pneumothorax. No acute bony abnormality. IMPRESSION: No active disease. Electronically Signed   By: Randa Ngo M.D.   On: 07/06/2019 13:40       Discharge  Exam: Vitals:   07/07/19 1605 07/07/19 2022  BP: (!) 152/108 (!) 129/99  Pulse: 75 (!) 103  Resp:  19  Temp: 99.6 F (37.6 C) 99.1 F (37.3 C)  SpO2:  100%    GENERAL: No acute distress.  Appears well.  HEENT: MMM.  Vision and hearing grossly intact.  NECK: Supple.  No apparent JVD.  RESP:  No IWOB. Good air movement bilaterally. CVS:  RRR. Heart sounds normal.  ABD/GI/GU: Bowel sounds present. Soft. Non tender.  MSK/EXT:  Moves extremities. No apparent deformity or edema.  SKIN: no apparent skin lesion or wound NEURO: Awake, alert and oriented appropriately.  No apparent focal neuro deficit. PSYCH: Calm. Normal affect.    The results of significant diagnostics from this hospitalization (including imaging, microbiology, ancillary and laboratory) are listed below for reference.     Microbiology: Recent Results (from the past 240 hour(s))  SARS CORONAVIRUS 2 (TAT 6-24 HRS) Nasopharyngeal Nasopharyngeal Swab     Status: None   Collection Time: 07/06/19 12:37 PM   Specimen: Nasopharyngeal Swab  Result Value Ref Range Status   SARS Coronavirus 2 NEGATIVE NEGATIVE Final    Comment: (NOTE) SARS-CoV-2 target nucleic acids are NOT DETECTED. The SARS-CoV-2 RNA is generally detectable in upper and lower respiratory specimens during the acute phase of infection. Negative results do not preclude SARS-CoV-2 infection, do not rule out co-infections with other pathogens, and should not be used as the sole basis for treatment or other patient management decisions. Negative results must be combined with clinical observations, patient history, and epidemiological information. The expected result is Negative. Fact Sheet for Patients: SugarRoll.be Fact Sheet for Healthcare Providers: https://www.woods-mathews.com/ This test is not yet approved or cleared by the [place] FDA and  has been authorized for detection and/or diagnosis of SARS-CoV-2  by FDA under an Emergency Use Authorization (EUA). This EUA will remain  in effect (meaning this test can be used) for the duration of the COVID-19 declaration under Section 56 4(b)(1) of the Act, 21 U.S.C. section 360bbb-3(b)(1), unless the authorization is terminated or revoked sooner. Performed at [facility], [place] [address], [place], [place] [address]      Labs: BNP (last 3 results) No results for input(s): BNP in the last 8760 hours. Basic Metabolic Panel: Recent Labs  Lab 07/06/19 1205 07/06/19 1205 07/06/19 1212 07/06/19 1534 07/06/19 1825 07/07/19 0327 07/07/19 0806 07/07/19 1200 07/08/19 0454  NA 136   < > 134* 145 138 139  --   --  136  K 3.7   < > 3.7 5.6* 4.2 3.2*  --   --  3.6  CL 90*  --   --  101 100 101  --   --  102  CO2 13*  --   --  '22 23 23  ' --   --  24  GLUCOSE 603*  --   --  120* 190* 67*  --   --  163*  BUN 5*  --   --  5* <5* <5*  --   --  <5*  CREATININE 1.10  --   --  0.90 0.73 0.81  --   --  0.72  CALCIUM 9.2  --   --  9.2 8.1* 8.4*  --   --  8.3*  MG  --   --   --   --   --   --  1.3*  --  1.7  PHOS  --   --   --   --   --   --   --  2.4* 3.2   < > = values in this interval not displayed.   Liver Function Tests: Recent Labs  Lab 07/06/19 1205 07/07/19 0327 07/08/19 0454  AST 111* 250*  --   ALT 69* QUANTITY NOT SUFFICIENT, UNABLE TO PERFORM TEST  --   ALKPHOS 250* 155*  --   BILITOT 0.9 QUANTITY NOT SUFFICIENT, UNABLE TO PERFORM TEST  --   PROT 7.6 5.4*  --   ALBUMIN 3.7 2.7* 2.6*   No results for input(s): LIPASE, AMYLASE in the last 168 hours. No results for input(s): AMMONIA in the last 168 hours. CBC: Recent Labs  Lab 07/06/19 1205 07/06/19 1212 07/07/19 0327 07/08/19 0454  WBC 10.0  --  9.4 9.4  NEUTROABS 9.2*  --   --   --   HGB 14.1 15.3 10.7* 10.7*  HCT 41.8 45.0 31.1* 31.1*  MCV 97.0  --  95.4 95.1  PLT 337  --  196 175   Cardiac Enzymes: No results for input(s): CKTOTAL, CKMB, CKMBINDEX, TROPONINI in the  last 168 hours. BNP: Invalid input(s): POCBNP CBG: Recent Labs  Lab 07/07/19 1604 07/07/19 2019 07/07/19 2127 07/08/19 0758 07/08/19 1124  GLUCAP 119* 248* 213* 110* 233*   D-Dimer No results for input(s): DDIMER in the last 72 hours. Hgb A1c Recent Labs    07/06/19 1535  HGBA1C 8.9*   Lipid Profile No results for input(s): CHOL, HDL, LDLCALC, TRIG, CHOLHDL, LDLDIRECT in the last 72 hours. Thyroid function studies No results for input(s): TSH, T4TOTAL, T3FREE, THYROIDAB in the last 72 hours.  Invalid input(s): FREET3 Anemia work up No results for input(s): VITAMINB12, FOLATE, FERRITIN, TIBC, IRON, RETICCTPCT in the last 72 hours. Urinalysis    Component Value Date/Time   COLORURINE STRAW (A) 07/06/2019 1216   APPEARANCEUR CLEAR 07/06/2019 1216   LABSPEC 1.025 07/06/2019 1216   PHURINE 5.0 07/06/2019 1216   GLUCOSEU >=500 (A) 07/06/2019 1216   HGBUR NEGATIVE 07/06/2019 1216   BILIRUBINUR NEGATIVE 07/06/2019 1216   KETONESUR NEGATIVE 07/06/2019 1216   PROTEINUR NEGATIVE 07/06/2019 1216   UROBILINOGEN 0.2 11/17/2014 1315   NITRITE NEGATIVE 07/06/2019 1216   LEUKOCYTESUR NEGATIVE 07/06/2019 1216   Sepsis Labs Invalid input(s): PROCALCITONIN,  WBC,  LACTICIDVEN   Time coordinating discharge: 40 minutes  SIGNED:  Mercy Riding, MD  [facility] 07/08/2019, 12:21 PM  If 7PM-7AM, please contact night-coverage [email or web address] Password [email or web address]

## 2019-07-08 NOTE — Plan of Care (Signed)
  Problem: Clinical Measurements: Goal: Respiratory complications will improve Outcome: Progressing Goal: Cardiovascular complication will be avoided Outcome: Progressing   Problem: Coping: Goal: Level of anxiety will decrease Outcome: Progressing   Problem: Pain Managment: Goal: General experience of comfort will improve Outcome: Progressing   

## 2019-07-08 NOTE — Evaluation (Signed)
Physical Therapy Evaluation Patient Details Name: Willie [name] MRN: [id number] DOB: 1975-12-08 Today's Date: 07/08/2019   History of Present Illness  44 y/o male w/ hx of sleep apnea, seizures, acute on chronic SDH w/ craniotomies (B 7/20, L 9/20), HTN, GERD, DM II, depression, alcohol abuse with acohol pancreatitis, low back pain w/ remote hx of lumbar microdiscectomy. Presented to hospital w/ c/o fall few days prior, had been drinking, and had slept since (two days prior 3/7). Reports 5 seizures at home, witnessed by EMS.  Pt dx with DKA and seizure.  Clinical Impression   Pt admitted with above dx and above hx. PTA was living home with girlfriend, states was independent with all ADLs and IADLs, if even he needed girlfriend will help with tasks around home, denied having nor using DME/AD. This am pt seems to be quite flat. States he is to go home today. Functionally did well with mobility was able to complete transfers etc with stand by assist after set up, line management. He was quite unaware of deficits and also lines etc around him. He was able to ambulate 249ft with min a on gait belt, noted listing to L (which pt did not) needing cues to correct this, pt was able to ascend/descend 3 steps with B rails and min guard assist, cues again needed for safety. Overall pt shows declines in balance and coordination, independence, activity tolerance and also safety awareness. He will benefit from continued PT tx while in hospital to address these and d/c to below recommended state.     Follow Up Recommendations No PT follow up    Equipment Recommendations  None recommended by PT    Recommendations for Other Services OT consult     Precautions / Restrictions Precautions Precautions: Fall Precaution Comments: seizures new onset Restrictions Weight Bearing Restrictions: No      Mobility  Bed Mobility Overal bed mobility: Needs Assistance Bed Mobility: Supine to Sit;Sit to Supine      Supine to sit: Supervision Sit to supine: Supervision   General bed mobility comments: needs line management, set up and cues   Transfers Overall transfer level: Needs assistance Equipment used: None Transfers: Sit to/from Stand;Stand Pivot Transfers Sit to Stand: Supervision;Min guard Stand pivot transfers: Supervision;Min guard          Ambulation/Gait Ambulation/Gait assistance: Min guard Gait Distance (Feet): 200 Feet Assistive device: None Gait Pattern/deviations: Step-through pattern;Drifts right/left Gait velocity: fair   General Gait Details: lists to L with ambulation, unaware of deficits  Stairs Stairs: Yes Stairs assistance: Min guard Stair Management: Two rails Number of Stairs: 3 General stair comments: able to descend with step to pattern ascends with reciprocal, min LOB noted  Wheelchair Mobility    Modified Rankin (Stroke Patients Only)       Balance Overall balance assessment: Needs assistance Sitting-balance support: Feet supported Sitting balance-Leahy Scale: Good     Standing balance support: During functional activity Standing balance-Leahy Scale: Fair Standing balance comment: lists to L with ambulation some minor balance loses noted also                             Pertinent Vitals/Pain Pain Assessment: No/denies pain    Home Living Family/patient expects to be discharged to:: Private residence Living Arrangements: Spouse/significant other Available Help at Discharge: Friend(s) Type of Home: Apartment Home Access: Stairs to enter Entrance Stairs-Rails: Can reach both Entrance Stairs-Number of Steps: 10 Home Layout: One  level Home Equipment: Grab bars - tub/shower      Prior Function Level of Independence: Independent               Hand Dominance   Dominant Hand: Right    Extremity/Trunk Assessment   Upper Extremity Assessment Upper Extremity Assessment: Defer to OT evaluation    Lower Extremity  Assessment Lower Extremity Assessment: Generalized weakness    Cervical / Trunk Assessment Cervical / Trunk Assessment: Normal  Communication   Communication: No difficulties  Cognition Arousal/Alertness: Awake/alert Behavior During Therapy: Flat affect Overall Cognitive Status: No family/caregiver present to determine baseline cognitive functioning                                 General Comments: does not seem to be quite at baseline functioning, not sure how much of this  is him vs still detoxing      General Comments General comments (skin integrity, edema, etc.): on RA with VSS stable IV site LUE    Exercises     Assessment/Plan    PT Assessment Patient needs continued PT services  PT Problem List Decreased activity tolerance;Decreased balance;Decreased mobility;Decreased coordination;Decreased cognition;Decreased safety awareness       PT Treatment Interventions Gait training;DME instruction;Stair training;Functional mobility training;Therapeutic activities;Therapeutic exercise;Balance training;Neuromuscular re-education;Cognitive remediation;Patient/family education    PT Goals (Current goals can be found in the Care Plan section)  Acute Rehab PT Goals Patient Stated Goal: states wants to go home PT Goal Formulation: With patient Time For Goal Achievement: 07/22/19 Potential to Achieve Goals: Fair    Frequency Min 3X/week   Barriers to discharge        Co-evaluation               AM-PAC PT "6 Clicks" Mobility  Outcome Measure Help needed turning from your back to your side while in a flat bed without using bedrails?: None Help needed moving from lying on your back to sitting on the side of a flat bed without using bedrails?: None Help needed moving to and from a bed to a chair (including a wheelchair)?: A Little Help needed standing up from a chair using your arms (e.g., wheelchair or bedside chair)?: A Little Help needed to walk in  hospital room?: A Little Help needed climbing 3-5 steps with a railing? : A Little 6 Click Score: 20    End of Session Equipment Utilized During Treatment: Gait belt Activity Tolerance: Patient limited by fatigue Patient left: in bed;with call bell/phone within reach;with bed alarm set Nurse Communication: Mobility status PT Visit Diagnosis: Other abnormalities of gait and mobility (R26.89);Unsteadiness on feet (R26.81)    Time: [id number] PT Time Calculation (min) (ACUTE ONLY): 19 min   Charges:   PT Evaluation $PT Eval Moderate Complexity: [place], PT   Delford Field 07/08/2019, 10:25 AM

## 2019-07-08 NOTE — Discharge Instructions (Signed)
- DO not drive for 6 months/until cleared by physician   Per [facility] statutes, patients with seizures are not allowed to drive until they have been seizure-free for six months. Patient acknowledged,repeated, and understands.    Use caution when using heavy equipment or power tools. Avoid working on ladders or at heights. Take showers instead of baths. Ensure the water temperature is not too high on the home water heater. Do not go swimming alone. Do not lock yourself in a room alone (i.e. bathroom). When caring for infants or small children, sit down when holding, feeding, or changing them to minimize risk of injury to the child in the event you have a seizure. Maintain good sleep hygiene. Avoid alcohol.    If patient has another seizure, call 911 and bring them back to the ED if: A.  Any seizure-like activity or alteration of mentation or LOC - especially if > 5 mins.      B.  The patient doesn't wake shortly after the seizure or has new problems such as difficulty seeing, speaking or moving following the seizure C.  The patient was injured during the seizure D.  The patient has a temperature over 102 F (39C) E.  The patient vomited during the seizure and now is having trouble breathing   During the Seizure   - First, ensure adequate ventilation and place patients on the floor on their left side  Loosen clothing around the neck and ensure the airway is patent. If the patient is clenching the teeth, do not force the mouth open with any object as this can cause severe damage - Remove all items from the surrounding that can be hazardous. The patient may be oblivious to what's happening and may not even know what he or she is doing. If the patient is confused and wandering, either gently guide him/her away and block access to outside areas - Reassure the individual and be comforting - Call 911. In most cases, the seizure ends before EMS arrives. However, there are cases when seizures may  last over 3 to 5 minutes. Or the individual may have developed breathing difficulties or severe injuries. If a pregnant patient or a person with diabetes develops a seizure, it is prudent to call an ambulance. - Finally, if the patient does not regain full consciousness, then call EMS. Most patients will remain confused for about 45 to 90 minutes after a seizure, so you must use judgment in calling for help. - Avoid restraints but make sure the patient is in a bed with padded side rails - Place the individual in a lateral position with the neck slightly flexed; this will help the saliva drain from the mouth and prevent the tongue from falling backward - Remove all nearby furniture and other hazards from the area - Provide verbal assurance as the individual is regaining consciousness - Provide the patient with privacy if possible - Call for help and start treatment as ordered by the caregiver    After the Seizure (Postictal Stage)   After a seizure, most patients experience confusion, fatigue, muscle pain and/or a headache. Thus, one should permit the individual to sleep. For the next few days, reassurance is essential. Being calm and helping reorient the person is also of importance.   Most seizures are painless and end spontaneously. Seizures are not harmful to others but can lead to complications such as stress on the lungs, brain and the heart. Individuals with prior lung problems may develop labored  breathing and respiratory distress.

## 2019-07-08 NOTE — Plan of Care (Signed)

## 2019-07-08 NOTE — Evaluation (Signed)
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 Presented to hospital w/ c/o fall few days prior, had been drinking, and had slept since (two days prior 3/7). Reports 5 seizures at home, witnessed by EMS.  Pt dx with DKA and seizure.   Clinical Impression   Pt admitted with the above diagnoses and presents with below problem list. Pt will benefit from continued acute OT to address the below listed deficits and maximize independence with basic ADLs prior to d/c to venue below. PTA pt reports he was independent with basic ADLs, lives with girlfriend. Pt presents with decreased cognition, most notably decreased awareness, impulsivity and decreased safety awareness. Pt with 2x LOB during toileting needing mod A to prevent fall. Needing max cues for safety and management of IV line with OOB activity. Unclear how this compares to his baseline and how much to attribute to current ETOH status. Pt does not appear safe to d/c home today from OT standpoint given current high fall risk. May need to consider speech therapy consult for cognitive eval if limited improvement by tomorrow.      Follow Up Recommendations  Supervision/Assistance - 24 hour;Home health OT    Equipment Recommendations  Other (comment)(to be determined next session)    Recommendations for Other Services       Precautions / Restrictions Precautions Precautions: Fall Precaution Comments: seizures new onset Restrictions Weight Bearing Restrictions: No Other Position/Activity Restrictions: impulsive and decreased safety awareness      Mobility Bed Mobility Overal bed mobility: Needs Assistance Bed  Mobility: Sit to Supine       Sit to supine: Supervision   General bed mobility comments: needs line management, set up and cues   Transfers Overall transfer level: Needs assistance Equipment used: None Transfers: Sit to/from Stand Sit to Stand: Min guard;Min assist;Mod assist         General transfer comment: 2x LOB needing mod A during toileting tasks/toilet transfer. Impulsive and needing max cues for safety.     Balance Overall balance assessment: Needs assistance Sitting-balance support: Feet supported Sitting balance-Leahy Scale: Good     Standing balance support: During functional activity Standing balance-Leahy Scale: Poor Standing balance comment: fair static standing balance, poor dynamic standing balance. noted to reach for at least single extremity support during dynamic standing tasks.                            ADL either performed or assessed with clinical judgement   ADL Overall ADL's : Needs assistance/impaired Eating/Feeding: Set up;Sitting Eating/Feeding Details (indicate cue type and reason): appears to have an increased appetite Grooming: Minimal assistance;Min guard;Standing   Upper Body Bathing: Set up;Sitting   Lower Body Bathing: Moderate assistance;Sit to/from stand   Upper Body Dressing : Set up;Sitting   Lower Body Dressing: Moderate assistance;Sit to/from stand   Toilet Transfer: Moderate assistance;Ambulation;Regular Toilet;Grab bars Toilet Transfer Details (indicate cue type and reason): mod A to steady due to LOB. decreased awareness and impulsive. Max cues for management of lines  Toileting- Clothing Manipulation and Hygiene: Moderate assistance;Sit to/from stand Toileting - Clothing Manipulation Details (indicate cue type and reason): LOB while  attempting pericare in standing position needing mod A to prevent a fall. Decreased awareness impacting functional performance and safety with this task Tub/ Shower Transfer: Tub  transfer;Moderate assistance;Ambulation   Functional mobility during ADLs: Minimal assistance General ADL Comments: Pt ambulating in the room close min guard to min A around obstacles and noted to reach for single extremity support. Pt suddenly stating he needed to go to bathroom, assist to manage IV lines due to decreased awareness. Once in the bathroom pt noted to have 2x LOB needing mod A to prevent a fall, Pt impulsive and with decreased safety awareness and decreased dyamic standing balance. Cognition and balance deficits impacting safety with ADLs and functional transfers/mobility.      Vision         Perception     Praxis      Pertinent Vitals/Pain Pain Assessment: No/denies pain     Hand Dominance Right   Extremity/Trunk Assessment Upper Extremity Assessment Upper Extremity Assessment: Generalized weakness   Lower Extremity Assessment Lower Extremity Assessment: Defer to PT evaluation       Communication Communication Communication: No difficulties   Cognition Arousal/Alertness: Awake/alert Behavior During Therapy: Impulsive;Flat affect Overall Cognitive Status: No family/caregiver present to determine baseline cognitive functioning                                 General Comments: Intelluctual awareness level. Flat affect, impulsive and decreased safety awareness. 2x LOB during toileting needing mod A to prevent a fall. Pt attempting to clean stool off of floor in front of toilet (bowel incontinence while sitting onto toilet?).  Was able to correctly state place, month and year (extra time needed).  total A for managment of IV lines as pt presents decreased awareness despite attempts to reorient him to presence of IV lines.    General Comments       Exercises     Shoulder Instructions      Home Living Family/patient expects to be discharged to:: Private residence Living Arrangements: Spouse/significant other Available Help at Discharge:  Friend(s) Type of Home: Apartment Home Access: Stairs to enter [employer] of Steps: 10 Entrance Stairs-Rails: Can reach both Home Layout: One level     Bathroom Shower/Tub: Teacher, early years/pre: Standard     Home Equipment: Grab bars - tub/shower          Prior Functioning/Environment Level of Independence: Independent        Comments: mostly ambulates around the home, not currently working.         OT Problem List: Impaired balance (sitting and/or standing)      OT Treatment/Interventions: Self-care/ADL training;DME and/or AE instruction;Therapeutic activities;Cognitive remediation/compensation;Patient/family education;Balance training    OT Goals(Current goals can be found in the care plan section) Acute Rehab OT Goals Patient Stated Goal: states wants to go home OT Goal Formulation: With patient Time For Goal Achievement: 07/22/19 Potential to Achieve Goals: Good ADL Goals Pt Will Perform Grooming: Independently;standing Pt Will Perform Lower Body Bathing: with modified independence;sit to/from stand Pt Will Perform Lower Body Dressing: with modified independence;sit to/from stand Pt Will Transfer to Toilet: with modified independence;ambulating Pt Will Perform Toileting - Clothing Manipulation and hygiene: with modified independence;sit to/from stand Pt Will Perform Tub/Shower Transfer: Tub transfer;with modified independence;ambulating  OT Frequency: Min 2X/week   Barriers to D/C:    unclear how much assist he has at home  Co-evaluation              AM-PAC OT "6 Clicks" Daily Activity     Outcome Measure Help from another person eating meals?: None Help from another person taking care of personal grooming?: A Little Help from another person toileting, which includes using toliet, bedpan, or urinal?: A Lot Help from another person bathing (including washing, rinsing, drying)?: A Lot Help from another person to put on  and taking off regular upper body clothing?: A Little Help from another person to put on and taking off regular lower body clothing?: A Lot 6 Click Score: 16   End of Session Nurse Communication: Mobility status;Precautions;Other (comment)(poor balance, impulsive, decreased awareness)  Activity Tolerance: Patient tolerated treatment well;Patient limited by fatigue Patient left: in bed;with call bell/phone within reach;with bed alarm set  OT Visit Diagnosis: Unsteadiness on feet (R26.81);Muscle weakness (generalized) (M62.81);Other abnormalities of gait and mobility (R26.89);Other symptoms and signs involving cognitive function                Time: [id number] OT Time Calculation (min): 21 min Charges:  OT General Charges $OT Visit: 1 Visit OT Evaluation $OT Eval Low Complexity: [place], OT Acute Rehabilitation Services Pager: [phone] Office: [phone]   Hortencia Pilar 07/08/2019, 12:35 PM

## 2019-07-08 NOTE — TOC Initial Note (Signed)
Transition of Care [facility]) - Initial/Assessment Note    Patient Details  Name: Willie [name] MRN: [id number] Date of Birth: 12/06/1975  Transition of [facility]) CM/SW Contact:    Bethena Roys, RN Phone Number: 07/08/2019, 12:45 PM  Clinical Narrative: Case Manager received referral for outpatient substance abuse resources. Case Manager provided patient with the community resources. Patient has a primary care provider and the hospital follow up appointment was scheduled and placed on the AVS. No further home needs identified for this patient at this time.     Expected Discharge Plan: Home/Self Care Barriers to Discharge: No Barriers Identified   Patient Goals and CMS Choice Patient states their goals for this hospitalization and ongoing recovery are:: "to return home"   Choice offered to / list presented to : NA  Expected Discharge Plan and Services Expected Discharge Plan: Home/Self Care In-house Referral: NA Discharge Planning Services: CM Consult Post Acute Care Choice: NA Living arrangements for the past 2 months: Single Family Home Expected Discharge Date: 07/08/19               DME Arranged: N/A DME Agency: NA       HH Arranged: NA HH Agency: NA        Prior Living Arrangements/Services Living arrangements for the past 2 months: Single Family Home Lives with:: Relatives Patient language and need for interpreter reviewed:: Yes Do you feel safe going back to the place where you live?: Yes      Need for Family Participation in Patient Care: Yes (Comment) Care giver support system in place?: Yes (comment)   Criminal Activity/Legal Involvement Pertinent to Current Situation/Hospitalization: No - Comment as needed  Activities of Daily Living Home Assistive Devices/Equipment: None ADL Screening (condition at time of admission) Patient's cognitive ability adequate to safely complete daily activities?: Yes Is the patient deaf or have difficulty  hearing?: No Does the patient have difficulty seeing, even when wearing glasses/contacts?: No Does the patient have difficulty concentrating, remembering, or making decisions?: No Patient able to express need for assistance with ADLs?: Yes Does the patient have difficulty dressing or bathing?: No Independently performs ADLs?: Yes (appropriate for developmental age) Does the patient have difficulty walking or climbing stairs?: No Weakness of Legs: None Weakness of Arms/Hands: None  Permission Sought/Granted Permission sought to share information with : Family Supports                Emotional Assessment Appearance:: Appears stated age Attitude/Demeanor/Rapport: Unable to Assess Affect (typically observed): Unable to Assess Orientation: : Oriented to Self, Oriented to Place, Oriented to  Time, Oriented to Situation Alcohol / Substance Use: Alcohol Use Psych Involvement: No (comment)  Admission diagnosis:  Lactic acidosis [E87.2] DKA (diabetic ketoacidosis) ([place]) [E11.10] Alcohol withdrawal seizure with complication ([place]) 0000000, [id number] Alcoholic intoxication without complication ([place]) [id number] Diabetic ketoacidosis without coma associated with type 2 diabetes mellitus ([place]) [E11.10] Alcohol withdrawal seizure ([place]) [id number], R56.9] Patient Active Problem List   Diagnosis Date Noted  . Alcohol withdrawal seizure ([place]) 07/07/2019  . DKA (diabetic ketoacidosis) ([place]) 07/06/2019  . OSA (obstructive sleep apnea) 07/06/2019  . Malnutrition of moderate degree 01/09/2019  . 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 Subdural bleeding ([place]) 11/24/2018  . Abdominal pain 11/22/2018  . SDH (subdural hematoma) ([place]) 11/22/2018  . Abnormal LFTs 11/22/2018  . IDDM (insulin dependent diabetes mellitus) 09/12/2018  . Acute hepatitis 09/11/2018  . Alcohol-induced mood disorder ([place])   . DKA (diabetic ketoacidoses) ([place]) 07/28/2017  . Alcohol withdrawal  seizure with complication, with unspecified  complication ([place]) 123456  . Abnormal CT scan, colon   . Benign neoplasm of descending colon   . Lower GI bleed 07/04/2017  . Transaminitis 07/04/2017  . Pain   . Bleeding internal hemorrhoids   . Anemia of chronic disease   . Left arm weakness   . Anemia, blood loss   . Vomiting alone 03/27/2017  . Chest pain 03/27/2017  . Nausea and vomiting   . Lactic acidosis 03/26/2017  . History of alcohol abuse 06/25/2016  . Tobacco abuse 06/25/2016  . Anxiety state 11/20/2015  . Dental infection 07/10/2015  . Mood disorder ([place]) 04/18/2015  . Eczema 06/27/2014  . GERD (gastroesophageal reflux disease) 05/24/2014  . Snoring 04/07/2013  . Suicide attempt by substance overdose ([place]) 02/10/2013  . Alcohol abuse 12/31/2012  . Erectile dysfunction 12/09/2012  . Hematuria 06/11/2012  . Vision problem 02/02/2012  . Insomnia 12/20/2011  . Chronic pancreatitis ([place]) 02/05/2011  . Depression with anxiety 11/13/2010  . Dyslipidemia 06/27/2010  . Diabetes type 2, uncontrolled ([place]) 05/22/2010  . Essential hypertension 05/22/2010   PCP:  Debbrah Alar, NP Pharmacy:   [facility] - [place], [address] [place] [address] Phone: [phone] Fax: [place] [id number] - [place], [place] - [address] [place] [address] [place] [address] Phone: [phone] Fax: [phone]  [facility] [place], [place] - [address] [place] [address] Phone: [phone] Fax: [place], [address] [place] [address] Phone: [phone] Fax: [phone]     Social Determinants of Health (SDOH) Interventions    Readmission Risk Interventions Readmission Risk Prevention Plan 07/08/2019   Transportation Screening Complete  Medication Review ([place]) Complete  PCP or Specialist appointment within 3-5 days of discharge Complete  HRI or [place] Complete  SW Recovery Care/Counseling Consult Complete  [employer] Not Applicable  Some encounter information is confidential and restricted. Go to Review Flowsheets activity to see all data.  Some recent data might be hidden

## 2019-07-08 NOTE — Plan of Care (Signed)
  Problem: Education: Goal: Knowledge of General Education information will improve Description: Including pain rating scale, medication(s)/side effects and non-pharmacologic comfort measures 07/08/2019 1318 by Camillia Herter, RN Outcome: Adequate for Discharge 07/08/2019 0737 by Camillia Herter, RN Outcome: Progressing   Problem: Health Behavior/Discharge Planning: Goal: Ability to manage health-related needs will improve 07/08/2019 1318 by Camillia Herter, RN Outcome: Adequate for Discharge 07/08/2019 0737 by Camillia Herter, RN Outcome: Progressing   Problem: Clinical Measurements: Goal: Ability to maintain clinical measurements within normal limits will improve 07/08/2019 1318 by Camillia Herter, RN Outcome: Adequate for Discharge 07/08/2019 0737 by Camillia Herter, RN Outcome: Progressing Goal: Will remain free from infection 07/08/2019 1318 by Camillia Herter, RN Outcome: Adequate for Discharge 07/08/2019 0737 by Camillia Herter, RN Outcome: Progressing Goal: Diagnostic test results will improve 07/08/2019 1318 by Camillia Herter, RN Outcome: Adequate for Discharge 07/08/2019 0737 by Camillia Herter, RN Outcome: Progressing Goal: Respiratory complications will improve 07/08/2019 1318 by Camillia Herter, RN Outcome: Adequate for Discharge 07/08/2019 0737 by Camillia Herter, RN Outcome: Progressing Goal: Cardiovascular complication will be avoided 07/08/2019 1318 by Camillia Herter, RN Outcome: Adequate for Discharge 07/08/2019 0737 by Camillia Herter, RN Outcome: Progressing   Problem: Activity: Goal: Risk for activity intolerance will decrease 07/08/2019 1318 by Camillia Herter, RN Outcome: Adequate for Discharge 07/08/2019 0737 by Camillia Herter, RN Outcome: Progressing   Problem: Nutrition: Goal: Adequate nutrition will be maintained 07/08/2019 1318 by Camillia Herter, RN Outcome: Adequate for Discharge 07/08/2019 0737 by Camillia Herter, RN Outcome: Progressing   Problem:  Coping: Goal: Level of anxiety will decrease 07/08/2019 1318 by Camillia Herter, RN Outcome: Adequate for Discharge 07/08/2019 0737 by Camillia Herter, RN Outcome: Progressing   Problem: Elimination: Goal: Will not experience complications related to bowel motility 07/08/2019 1318 by Camillia Herter, RN Outcome: Adequate for Discharge 07/08/2019 0737 by Camillia Herter, RN Outcome: Progressing Goal: Will not experience complications related to urinary retention 07/08/2019 1318 by Camillia Herter, RN Outcome: Adequate for Discharge 07/08/2019 0737 by Camillia Herter, RN Outcome: Progressing   Problem: Pain Managment: Goal: General experience of comfort will improve 07/08/2019 1318 by Camillia Herter, RN Outcome: Adequate for Discharge 07/08/2019 0737 by Camillia Herter, RN Outcome: Progressing   Problem: Safety: Goal: Ability to remain free from injury will improve 07/08/2019 1318 by Camillia Herter, RN Outcome: Adequate for Discharge 07/08/2019 0737 by Camillia Herter, RN Outcome: Progressing   Problem: Skin Integrity: Goal: Risk for impaired skin integrity will decrease 07/08/2019 1318 by Camillia Herter, RN Outcome: Adequate for Discharge 07/08/2019 0737 by Camillia Herter, RN Outcome: Progressing

## 2019-07-08 NOTE — Care Management (Signed)
07-08-19 MATCH completed for this patient- No further needs from Case Manager at this time. Bethena Roys, RN,BSN Case Manager [phone]

## 2019-07-09 ENCOUNTER — Telehealth: Payer: Self-pay | Admitting: *Deleted

## 2019-07-09 NOTE — Telephone Encounter (Signed)
Unable to reach pt. 1st attempt. Hosp f/u scheduled 07/13/19

## 2019-07-12 NOTE — Telephone Encounter (Signed)
I have made two attempts and have been unable to reach patient. Pt has hospital follow up appt. W/ PCP 07/13/19

## 2019-07-13 ENCOUNTER — Other Ambulatory Visit: Payer: Self-pay | Admitting: Family

## 2019-07-13 ENCOUNTER — Encounter: Payer: Self-pay | Admitting: Family

## 2019-07-13 ENCOUNTER — Ambulatory Visit: Payer: No Typology Code available for payment source | Admitting: Family

## 2019-07-20 ENCOUNTER — Other Ambulatory Visit: Payer: Self-pay

## 2019-07-20 ENCOUNTER — Encounter: Payer: Self-pay | Admitting: Family

## 2019-07-20 ENCOUNTER — Ambulatory Visit (INDEPENDENT_AMBULATORY_CARE_PROVIDER_SITE_OTHER): Payer: No Typology Code available for payment source | Admitting: Family

## 2019-07-20 VITALS — BP 115/67 | HR 110

## 2019-07-20 DIAGNOSIS — Z0000 Encounter for general adult medical examination without abnormal findings: Secondary | ICD-10-CM

## 2019-07-20 DIAGNOSIS — D649 Anemia, unspecified: Secondary | ICD-10-CM

## 2019-07-20 DIAGNOSIS — E119 Type 2 diabetes mellitus without complications: Secondary | ICD-10-CM

## 2019-07-20 DIAGNOSIS — R569 Unspecified convulsions: Secondary | ICD-10-CM

## 2019-07-20 NOTE — Progress Notes (Signed)
Virtual Visit via Video Note  I connected with Willie [name] on 07/20/19 at  2:20 PM EDT by a video enabled telemedicine application and verified that I am speaking with the correct person using two identifiers.  Location: Patient: home Provider: work    I discussed the limitations of evaluation and management by telemedicine and the availability of in person appointments. The patient expressed understanding and agreed to proceed.  History of Present Illness:  Patient is a 44 year old male who presents today for hospital follow-up.  Patient has history of uncontrolled diabetes type 2, SDH status post craniectomy, and alcohol abuse.  The patient was hospitalized from 3/9-3/11.  He reports a series of seizures. 2 occurred at home, 1 in the ambulance and one at the hospital.  He was brought to the emergency department following a witnessed seizure.  He was noted to be in DKA.  His DKA was treated and he was transitioned to subcutaneous insulin.  He was evaluated by neurology in the hospital and was placed on Keppra 500 mg twice daily.  He was discharged home on a Librium taper.  Was started on Keppra. Reports no seizure since returning home but does note generalized weakness.   He reports that his sugars at home have ranged 100-400.  Sugar sugar was 357 this morning prior to insulin.  He denies any alcohol use since returning home.  He reports that he is using lantus 15-20 units on "sliding scale."  States that he uses 15 units unless his sugar is "very high" at which time he will give himself 20 units.  He is also  using humalog AC meals.  Reports that he uses the sliding scale as below to dose his Humalog.   131-80   0 units 81-240             4 units 241-300           6 units 300-350           8 units 351-400           10 units >400                12 units and call doctor.    Lab Results  Component Value Date   HGBA1C 8.9 (H) 07/06/2019    Observations/Objective:   Gen: Awake,  alert, no acute distress Resp: Breathing is even and non-labored Psych: calm/pleasant demeanor Neuro: Alert and Oriented x 3, + facial symmetry, speech is somewhat slowed, cognition appears slowed   Assessment and Plan:  Seizure- recommended that pt schedule a lab visit for follow up [place] level. No seizures since discharge. Will arrange outpatient Neurology consult.  DM2- uncontrolled. Will refer to Endocrinology.  Anemia-(macrocytic) check follow up cbc, b12/folate  30 minutes spent on today's visit. Time was spent reviewing chart, counseling pt on diabetes treatment and seizure treatment.    Follow Up Instructions:    I discussed the assessment and treatment plan with the patient. The patient was provided an opportunity to ask questions and all were answered. The patient agreed with the plan and demonstrated an understanding of the instructions.   The patient was advised to call back or seek an in-person evaluation if the symptoms worsen or if the condition fails to improve as anticipated.  Nance Pear, NP

## 2019-07-21 ENCOUNTER — Other Ambulatory Visit: Payer: Self-pay | Admitting: *Deleted

## 2019-07-21 NOTE — Patient Outreach (Signed)
[facility]) Care Management  07/21/2019  Willie [name] 09-Feb-1976 [id number]   Transition of care telephone call  Referral received:07/20/19 Initial outreach:07/21/19 Insurance: [place]  Initial unsuccessful telephone call to patient's preferred number in order to complete transition of care assessment;  Explained reason for the call, name and DOB confirmed ,patient states unable to talk at this time declined later call today  , stating he is busy, "call back next week"     Objective: Per the electronic medical record, Willie [name]   was hospitalized at [facility]  from 3/9-3/11/21 for Diabetic ketoacidosis,  Seizures, Alcohol withdrawal, Comorbidities include: Alcohol Abuse, Diabetes type 2 insulin dependant , subdural hematoma,craniotomy 12/2018, Hypertension .   He was discharged to home on 3/11/21without the need for home health services or durable medical equipment per the discharge summary. Noted patient had post discharge visit with PCP on 07/21/19.  Plan: This RNCM will route unsuccessful outreach letter with [place] Management pamphlet and 24 hour Nurse Advice Line Magnet to [facility] clinical pool to be mailed to patient's home address. This RNCM will attempt another outreach within 4 business days.   Joylene Draft, RN, BSN  [facility] Management Coordinator  [phone]- Mobile [phone]- Toll Free Main Office

## 2019-07-23 NOTE — Telephone Encounter (Signed)
error 

## 2019-07-26 ENCOUNTER — Encounter: Payer: Self-pay | Admitting: *Deleted

## 2019-07-26 ENCOUNTER — Telehealth: Payer: Self-pay | Admitting: Family

## 2019-07-26 ENCOUNTER — Other Ambulatory Visit: Payer: Self-pay | Admitting: *Deleted

## 2019-07-26 NOTE — Patient Outreach (Signed)
[facility]) Care Management  07/26/2019  Willie [name] 12-11-1975 [id number]   Transition of care call   Referral received:07/20/19 Initial outreach:07/21/19 Insurance: [place] [employer]   Subjective: 2nd attempt successful telephone call to patient's preferred number in order to complete transition of care assessment; 2 HIPAA identifiers verified. Explained purpose of call and completed transition of care assessment.  Patient states that he is doing okay still just tired, he placed his phone on speaker so that I could speak with his friend Lowella Grip that is staying with him. She reports helping provide care to patient , checking his blood sugar, making sure he that is eating and taking his insulin. She reports his blood sugar range has been in the 200 to 300's reading this morning was 222, she reports giving patient 4 units of Novolog this am, he reports using a sliding scale with Humalog , she is unable is states that current list, but states he reviewed it with his doctor at visit.   He is able to state  She and Wille Glaser denies  having low blood episode of 70 or below, review of symptoms and how to treat.   She voiced being concern due to patient being tired and sleeping a lot more. She reports him having a seizure 2 days ago, she states that it only lasted less than 4 minutes, they did not call MD or 911.  She reports that the patient has not been drinking alcohol. Patient states that he filled his pill organizer with 2 week supply of medications,he is unable to locate discharge AVS for medication review and unable to locate medication bottles for review of medications  but states that he is taking his seizure medication all medications  Patient partner states patient was evicted from his apartment while  hospital and they are now staying at a hotel, she is able to provide transportation. Patient states that he has received a voicemail regarding contacting endocrine office to set up  an appointment. He states that he will call, offered assistance to placing call to office at this time, he declined stating that he would do it.Discussed with  patient regarding follow up on resources provided to him by inpatient case manager on outpatient resources for substance abuse states he has not made contact yet.  He  States that  he no longer has [employer] plan as he and wife subscriber are now divorced, he states that he seeking medicaid and has disability.    Objective:  Willie [name]   was hospitalized at [facility]  from 3/9-3/11/21 for Diabetic ketoacidosis,  Seizures, Alcohol withdrawal, Comorbidities include: Alcohol Abuse, Diabetes type 2 insulin dependant , subdural hematoma,craniotomy 12/2018, Hypertension .   He was discharged to home on 3/11/21without the need for home health services or durable medical equipment per the discharge summary. Noted patient had post discharge visit with PCP on 07/21/19. Assessment:  Patient voices good understanding of all discharge instructions.  See transition of care flowsheet for assessment details.Patient will benefit from ongoing care coordination and education support.    Plan:  Reviewed hospital discharge diagnosis of Seizure, Diabetic ketoacidosis, alcohol withdrawal.    and discharge treatment plan using hospital discharge instructions, assessing medication adherence, reviewing problems requiring provider notification, and discussing the importance of follow up with  primary care provider and/or specialists as directed. Placed call to Debbrah Alar , NP office spoke with representative Sherlyn Hay, reported concern regarding patient caregiver report of patient with report of  seizure 2 days ago  and patient concern regarding continued complaint of tiredness and reports recent blood sugars.  Noted patient does not have insurance under [employer] plan, reports having disability and medicaid pending.Reviewed case with  [facility] care management assistant director. Transition of care call completed and following any communication with PCP office will follow up with patient within the next 4 business days  and close transition of care case.     Joylene Draft, RN, BSN  [facility] Management Coordinator  [phone]- Mobile [phone]- Toll Free Main Office

## 2019-07-26 NOTE — Telephone Encounter (Signed)
Caller :Renato Gails Call Back # [phone]  Patient states that patient is still weak and blood sugar was 222,  Has been in  200-300 range.

## 2019-07-28 NOTE — Telephone Encounter (Signed)
Attempted to reach pt to follow up. Left detailed message advising pt to schedule a face to face visit if sugars are still running high and/or if he is still feeling weak.

## 2019-07-29 ENCOUNTER — Other Ambulatory Visit: Payer: Self-pay | Admitting: *Deleted

## 2019-07-29 NOTE — Patient Outreach (Signed)
[facility]) Care Management  07/29/2019  Willie [name] [birth year] [id number]   Transition of care follow up/Case Closure Referral received:07/20/19 Initial outreach:07/21/19 Insurance: [place] ( Patient reports no longer on [facility] Health insurance plan reports having Medicaid.    Subjective  Successful outreach call to patient he reports doing okay. He states that he has been moving, currently at Extended stay and plans to move into new apartment just received message apartment ready.  Discussed with patient follow up with PCP as recommended , he acknowledges  receiving message from office and will call . His girlfriend Lowella Grip is with patient and phone on speaker, she reports blood sugars are in 200 range, she is making sure he checks blood sugar at least 3 times a day and helps with giving insulin . Reinforced importance of MD follow up for concern of elevated blood sugars at 200 range.   She reports patient having recent fall while getting out of bed at night , but did not hit his head or have injury. She states has encouraged him to slow down when getting up.   Objective: [name] hospitalized at [facility] from 3/9-3/11/21 for Diabetic ketoacidosis, Seizures, Alcohol withdrawal, Comorbidities include:Alcohol Abuse, Diabetes type 2 insulin dependant , subdural hematoma,craniotomy 12/2018, Hypertension .He was discharged to home on 3/11/21without the need for home health services or durable medical equipment per the discharge summary.  Plan Will plan case closure, patient no longer on [place] insurance plan. Reinforced follow up with PCP as recommended and  if new concerns, recurrent falls, continued weakness, elevated blood sugars, or seeking medical attention for recurrent seizure activity.    Joylene Draft, RN, BSN  [place] Management Coordinator  [phone]- Mobile [phone]- Toll Free Main  Office .

## 2019-08-11 ENCOUNTER — Telehealth: Payer: Self-pay | Admitting: Family

## 2019-08-11 ENCOUNTER — Emergency Department (HOSPITAL_COMMUNITY)
Admission: EM | Admit: 2019-08-11 | Discharge: 2019-08-11 | Disposition: A | Discharge status: Home / Self Care | Payer: Medicaid Other | Attending: Emergency Medicine | Admitting: Emergency Medicine

## 2019-08-11 ENCOUNTER — Encounter (HOSPITAL_COMMUNITY): Payer: Self-pay

## 2019-08-11 ENCOUNTER — Telehealth: Payer: Self-pay

## 2019-08-11 ENCOUNTER — Other Ambulatory Visit: Payer: Self-pay

## 2019-08-11 ENCOUNTER — Emergency Department (HOSPITAL_COMMUNITY): Payer: Medicaid Other

## 2019-08-11 DIAGNOSIS — R4182 Altered mental status, unspecified: Secondary | ICD-10-CM | POA: Insufficient documentation

## 2019-08-11 DIAGNOSIS — F17210 Nicotine dependence, cigarettes, uncomplicated: Secondary | ICD-10-CM | POA: Insufficient documentation

## 2019-08-11 DIAGNOSIS — Z7901 Long term (current) use of anticoagulants: Secondary | ICD-10-CM | POA: Insufficient documentation

## 2019-08-11 DIAGNOSIS — E119 Type 2 diabetes mellitus without complications: Secondary | ICD-10-CM | POA: Insufficient documentation

## 2019-08-11 DIAGNOSIS — K7010 Alcoholic hepatitis without ascites: Secondary | ICD-10-CM | POA: Insufficient documentation

## 2019-08-11 DIAGNOSIS — F10920 Alcohol use, unspecified with intoxication, uncomplicated: Secondary | ICD-10-CM

## 2019-08-11 LAB — COMPREHENSIVE METABOLIC PANEL
ALT: 135 U/L — ABNORMAL HIGH (ref 0–44)
AST: 227 U/L — ABNORMAL HIGH (ref 15–41)
Albumin: 2.9 g/dL — ABNORMAL LOW (ref 3.5–5.0)
Alkaline Phosphatase: 328 U/L — ABNORMAL HIGH (ref 38–126)
Anion gap: 10 (ref 5–15)
BUN: <5 mg/dL — ABNORMAL LOW (ref 6–20)
CO2: 30 mmol/L (ref 22–32)
Calcium: 8.6 mg/dL — ABNORMAL LOW (ref 8.9–10.3)
Chloride: 99 mmol/L (ref 98–111)
Creatinine, Ser: 0.63 mg/dL (ref 0.61–1.24)
GFR calc Af Amer: >60 mL/min (ref >60)
GFR calc non Af Amer: >60 mL/min (ref >60)
Glucose, Bld: 144 mg/dL — ABNORMAL HIGH (ref 70–99)
Potassium: 3.4 mmol/L — ABNORMAL LOW (ref 3.5–5.1)
Sodium: 139 mmol/L (ref 135–145)
Total Bilirubin: 0.7 mg/dL (ref 0.3–1.2)
Total Protein: 6.7 g/dL (ref 6.5–8.1)

## 2019-08-11 LAB — CBC WITH DIFFERENTIAL/PLATELET
Abs Immature Granulocytes: 0.01 10*3/uL (ref 0.00–0.07)
Basophils Absolute: 0 10*3/uL (ref 0.0–0.1)
Basophils Relative: 1 %
Eosinophils Absolute: 0.1 10*3/uL (ref 0.0–0.5)
Eosinophils Relative: 2 %
HCT: 38.3 % — ABNORMAL LOW (ref 39.0–52.0)
Hemoglobin: 13.2 g/dL (ref 13.0–17.0)
Immature Granulocytes: 0 %
Lymphocytes Relative: 22 %
Lymphs Abs: 1 10*3/uL (ref 0.7–4.0)
MCH: 32 pg (ref 26.0–34.0)
MCHC: 34.5 g/dL (ref 30.0–36.0)
MCV: 92.7 fL (ref 80.0–100.0)
Monocytes Absolute: 0.2 10*3/uL (ref 0.1–1.0)
Monocytes Relative: 5 %
Neutro Abs: 3.3 10*3/uL (ref 1.7–7.7)
Neutrophils Relative %: 70 %
Platelets: 221 10*3/uL (ref 150–400)
RBC: 4.13 MIL/uL — ABNORMAL LOW (ref 4.22–5.81)
RDW: 12.5 % (ref 11.5–15.5)
WBC: 4.7 10*3/uL (ref 4.0–10.5)
nRBC: 0 % (ref 0.0–0.2)

## 2019-08-11 LAB — RAPID URINE DRUG SCREEN, HOSP PERFORMED
Amphetamines: NOT DETECTED
Barbiturates: NOT DETECTED
Benzodiazepines: POSITIVE — AB
Cocaine: NOT DETECTED
Opiates: NOT DETECTED
Tetrahydrocannabinol: NOT DETECTED

## 2019-08-11 LAB — LACTIC ACID, PLASMA
Lactic Acid, Venous: 2.2 mmol/L (ref 0.5–1.9)
Lactic Acid, Venous: 2.3 mmol/L (ref 0.5–1.9)

## 2019-08-11 LAB — CBG MONITORING, ED: Glucose-Capillary: 141 mg/dL — ABNORMAL HIGH (ref 70–99)

## 2019-08-11 LAB — URINALYSIS, ROUTINE W REFLEX MICROSCOPIC
Bilirubin Urine: NEGATIVE
Glucose, UA: 150 mg/dL — AB
Hgb urine dipstick: NEGATIVE
Ketones, ur: NEGATIVE mg/dL
Leukocytes,Ua: NEGATIVE
Nitrite: NEGATIVE
Protein, ur: NEGATIVE mg/dL
Specific Gravity, Urine: 1.006 (ref 1.005–1.030)
pH: 7 (ref 5.0–8.0)

## 2019-08-11 LAB — BETA-HYDROXYBUTYRIC ACID: Beta-Hydroxybutyric Acid: 0.08 mmol/L (ref 0.05–0.27)

## 2019-08-11 LAB — ETHANOL: Alcohol, Ethyl (B): 302 mg/dL (ref <10)

## 2019-08-11 LAB — MAGNESIUM: Magnesium: 1.6 mg/dL — ABNORMAL LOW (ref 1.7–2.4)

## 2019-08-11 MED ORDER — LACTATED RINGERS IV BOLUS
1000.0000 mL | Freq: Once | INTRAVENOUS | Status: AC
  Administered 2019-08-11: 1000 mL via INTRAVENOUS

## 2019-08-11 MED ORDER — ACETAMINOPHEN 325 MG PO TABS
650.0000 mg | ORAL_TABLET | Freq: Once | ORAL | Status: AC
  Administered 2019-08-11: 650 mg via ORAL
  Filled 2019-08-11: qty 2

## 2019-08-11 MED ORDER — LANTUS SOLOSTAR 100 UNIT/ML ~~LOC~~ SOPN: 15.0000 [IU] | PEN_INJECTOR | Freq: Every day | SUBCUTANEOUS | 1 refills | Status: AC

## 2019-08-11 MED ORDER — LEVETIRACETAM IN NACL 1000 MG/100ML IV SOLN
1000.0000 mg | Freq: Once | INTRAVENOUS | Status: AC
  Administered 2019-08-11: 1000 mg via INTRAVENOUS
  Filled 2019-08-11: qty 100

## 2019-08-11 NOTE — Telephone Encounter (Signed)
Patient friend called in to report that the patients blood sugar was very high 588 patient kept passing out and keeps falling a sleep patient friend is saying that the patient do not  have an address so I [name] [name] advised to call 911 because the patient was not doing well. Please follow up with the patient at [phone]

## 2019-08-11 NOTE — ED Triage Notes (Signed)
Pt arrives to ED via [facility] d/t his room mate calling 911 with reports of pt having AMS, calling her the wrong name, urinating when he sleeps & his CBG was 535. EMS reports his room mate gave an unknown amt of insulin & his CBG was 120 upon their arrival on scene & his BP was 70/40. They gave pt 500cc bolus of NS & his SBP when up to 98 by the time they arrived to ED. Upon arrival pt reports being out of insulin for 4 days, A/Ox4, slow to speak/respond with a slight slur. Hx of brain surgery aeb sutures to the Lt side of cranium, pt states he has new pain in his head rated 8/10.

## 2019-08-11 NOTE — ED Notes (Signed)
Patient verbalizes understanding of discharge instructions. Opportunity for questioning and answers were provided. Armband removed by staff, pt discharged from ED ambulatory.   

## 2019-08-11 NOTE — Discharge Instructions (Signed)
You were evaluated in the emergency department for being altered and having slurred speech.  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  Your liver enzymes and alcohol level were also elevated.  Your confusion improved while you were here.  It will be important that you cut back on alcohol and potentially seek assistance to helping stop alcohol completely.  Please continue your regular medications.  Please follow-up with your primary care doctor and return to the emergency department if any worsening or concerning symptoms

## 2019-08-11 NOTE — Telephone Encounter (Signed)
Called PT left message to call in for an in person appointment.

## 2019-08-11 NOTE — Telephone Encounter (Signed)
He should schedule an in office visit please.

## 2019-08-11 NOTE — ED Provider Notes (Signed)
[facility] EMERGENCY DEPARTMENT Provider Note   CSN: [id number] Arrival date & time: 08/11/19  1351     History Chief Complaint  Patient presents with  . Altered Mental Status    Willie [name] is a 44 y.o. male.  HPI   44 year old male with altered mental status.  Patient's roommate called EMS after patient seemed confused.  He was calling her the wrong name.  Very sleepy and urinating himself and sleeping.  Apparently she checked his glucose and it was in the 500s.  She gave him insulin but is unclear how much.  On EMS arrival his glucose was 120.  His initial blood pressure 70/40 but improved to almost 100 with 500 cc of normal saline.  Patient reports to me that he has been without his medications including insulin for the past 4 days.  He cannot tell me where the insulin his roommate reportedly gave him came from..  Patient states that over the past several days been extremely tired.  His sugars have been running in the 400s+.  Past Medical History:  Diagnosis Date  . Alcohol abuse   . Alcoholic pancreatitis    recurrent  . Depression   . Diabetes mellitus, type II ([place])    New Onset 03/2010  . GERD (gastroesophageal reflux disease)   . High cholesterol   . 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 Hypertension   . SDH (subdural hematoma) ([place]) 12/2018   crainotomy  . Seizures ([place])   . Sleep apnea    does not wear a CPAP    Patient Active Problem List   Diagnosis Date Noted  . Alcohol withdrawal seizure ([place]) 07/07/2019  . DKA (diabetic ketoacidosis) ([place]) 07/06/2019  . OSA (obstructive sleep apnea) 07/06/2019  . Malnutrition of moderate degree 01/09/2019  . 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 Subdural bleeding ([place]) 11/24/2018  . Abdominal pain 11/22/2018  . SDH (subdural hematoma) ([place]) 11/22/2018  . Abnormal LFTs 11/22/2018  . IDDM (insulin dependent diabetes mellitus) 09/12/2018  . Acute  hepatitis 09/11/2018  . Alcohol-induced mood disorder ([place])   . DKA (diabetic ketoacidoses) ([place]) 07/28/2017  . Alcohol withdrawal seizure with complication, with unspecified complication ([place]) 46/56/8127  . Abnormal CT scan, colon   . Benign neoplasm of descending colon   . Lower GI bleed 07/04/2017  . Transaminitis 07/04/2017  . Pain   . Bleeding internal hemorrhoids   . Anemia of chronic disease   . Left arm weakness   . Anemia, blood loss   . Vomiting alone 03/27/2017  . Chest pain 03/27/2017  . Nausea and vomiting   . Lactic acidosis 03/26/2017  . History of alcohol abuse 06/25/2016  . Tobacco abuse 06/25/2016  . Anxiety state 11/20/2015  . Dental infection 07/10/2015  . Mood disorder ([place]) 04/18/2015  . Eczema 06/27/2014  . GERD (gastroesophageal reflux disease) 05/24/2014  . Snoring 04/07/2013  . Suicide attempt by substance overdose ([place]) 02/10/2013  . Alcohol abuse 12/31/2012  . Erectile dysfunction 12/09/2012  . Hematuria 06/11/2012  . Vision problem 02/02/2012  . Insomnia 12/20/2011  . Chronic pancreatitis ([place]) 02/05/2011  . Depression with anxiety 11/13/2010  . Dyslipidemia 06/27/2010  . Diabetes type 2, uncontrolled ([place]) 05/22/2010  . Essential hypertension 05/22/2010    Past Surgical History:  Procedure Laterality Date  . COLONOSCOPY W/ BIOPSIES  09/2010   Dr. Ardis Hughs.  for intermittent rectal bleeding.  Mild sigmoid to descending  diverticulosis.  Mild left colon erythema, benign biopsy, probably "prep effect"  . COLONOSCOPY WITH PROPOFOL N/A 07/06/2017   Procedure: COLONOSCOPY WITH PROPOFOL;  Surgeon: Jerene Bears, MD;  Location: [facility];  Service: Gastroenterology;  Laterality: N/A;  . CRANIOTOMY Bilateral 11/24/2018   Procedure: Bilateral craniotomy for evacuation of subdural hematoma;  Surgeon: Erline Levine, MD;  Location: [facility];  Service: Neurosurgery;  Laterality: Bilateral;  Bilateral craniotomy for evacuation of subdural hematoma  . CRANIOTOMY  Left 01/06/2019   Procedure: CRANIECTOMY;  Surgeon: Earnie Larsson, MD;  Location: [facility];  Service: Neurosurgery;  Laterality: Left;  . LUMBAR MICRODISCECTOMY  ~ 2004   Dr Ellene Route       Family History  Problem Relation Age of Onset  . Diabetes Mother   . Lung cancer Brother        twin brother  . Pancreatic cancer Paternal Aunt   . Colon cancer Neg Hx   . Stomach cancer Neg Hx     Social History   Tobacco Use  . Smoking status: Current Every Day Smoker    Packs/day: 0.50    Years: 2.00    Pack years: 1.00    Types: Cigarettes    Start date: 04/29/2016  . Smokeless tobacco: Never Used  . Tobacco comment: 4 Cigarettes a day   Substance Use Topics  . Alcohol use: Yes    Comment: vodka daily  . Drug use: Not Currently    Comment: none in years    Home Medications Prior to Admission medications   Medication Sig Start Date End Date Taking? Authorizing Provider  amLODipine (NORVASC) 10 MG tablet Take 1 tablet (10 mg total) by mouth daily. 07/08/19   Mercy Riding, MD  atorvastatin (LIPITOR) 20 MG tablet Take 1 tablet (20 mg total) by mouth daily. 07/08/19 01/04/20  Mercy Riding, MD  blood glucose meter kit and supplies KIT Dispense based on patient and insurance preference. Use up to four times daily as directed. (FOR ICD-9 250.00, 250.01). 07/24/18   Debbrah Alar, NP  chlordiazePOXIDE (LIBRIUM) 25 MG capsule Take 1 capsule 3 times a day for 2 days, then 2 times a day for 2 days and then 1 times a day for 2 days 07/08/19   Mercy Riding, MD  Continuous Blood Gluc Sensor (FREESTYLE LIBRE 14 DAY SENSOR) MISC Inject 1 each into the skin every 14 (fourteen) days. 04/27/18   Debbrah Alar, NP  escitalopram (LEXAPRO) 20 MG tablet TAKE ONE TABLET BY MOUTH DAILY 07/14/19   Debbrah Alar, NP  folic acid (FOLVITE) 1 MG tablet TAKE ONE TABLET BY MOUTH DAILY Patient taking differently: Take 1 mg by mouth daily.  07/05/19   Debbrah Alar, NP  glucagon 1 MG injection Follow package  directions for low blood sugar. 07/08/19 07/07/20  Mercy Riding, MD  glucose blood (FREESTYLE LITE) test strip Use as instructed 07/24/18   Debbrah Alar, NP  insulin glargine (LANTUS SOLOSTAR) 100 UNIT/ML Solostar Pen Inject 15 Units into the skin at bedtime. 07/08/19   Mercy Riding, MD  insulin lispro (HUMALOG KWIKPEN) 100 UNIT/ML KwikPen Inject 0.09 mLs (9 Units total) into the skin 3 (three) times daily before meals. 07/08/19   Mercy Riding, MD  Insulin Pen Needle (ULTICARE SHORT PEN NEEDLES) 31G X 8 MM MISC Use once daily to inject insulin. 04/27/18   Debbrah Alar, NP  Lancets (FREESTYLE) lancets 2 (two) times daily 02/27/17   Debbrah Alar, NP  levETIRAcetam (KEPPRA) 500 MG  tablet Take 1 tablet (500 mg total) by mouth 2 (two) times daily. 07/08/19 01/04/20  Mercy Riding, MD  LORazepam (ATIVAN) 1 MG tablet TAKE ONE TABLET BY MOUTH EVERY 8 HOURS AS NEEDED FOR ANXIETY 07/14/19   Debbrah Alar, NP  Multiple Vitamins-Minerals (MULTIVITAMIN) tablet Take 1 tablet by mouth daily. 01/14/19   Erline Levine, MD  pantoprazole (PROTONIX) 40 MG tablet Take 1 tablet (40 mg total) by mouth 2 (two) times daily. 01/14/19   Erline Levine, MD  QUEtiapine (SEROQUEL) 300 MG tablet TAKE ONE TABLET BY MOUTH AT BEDTIME 07/14/19   Debbrah Alar, NP  sildenafil (REVATIO) 20 MG tablet TAKE 1-2 TABLETS BY MOUTH PRIOR TO SEXUAL ACTIVITY Patient taking differently: Take 20-40 mg by mouth See admin instructions. Prior to sexual acivity 03/22/19   Debbrah Alar, NP  thiamine 100 MG tablet Take 1 tablet (100 mg total) by mouth daily. 07/09/19   Mercy Riding, MD    Allergies    Invokamet [canagliflozin-metformin hcl] and Metformin and related  Review of Systems   Review of Systems All systems reviewed and negative, other than as noted in HPI.  Physical Exam Updated Vital Signs There were no vitals taken for this visit.  Physical Exam Vitals and nursing note reviewed.  Constitutional:       General: He is not in acute distress.    Appearance: He is well-developed.  HENT:     Head: Normocephalic.     Comments:  left craniotomy scar Eyes:     General:        Right eye: No discharge.        Left eye: No discharge.     Conjunctiva/sclera: Conjunctivae normal.  Cardiovascular:     Rate and Rhythm: Normal rate and regular rhythm.     Heart sounds: Normal heart sounds. No murmur. No friction rub. No gallop.   Pulmonary:     Effort: Pulmonary effort is normal. No respiratory distress.     Breath sounds: Normal breath sounds.  Abdominal:     General: There is no distension.     Palpations: Abdomen is soft.     Tenderness: There is no abdominal tenderness.  Musculoskeletal:        General: No tenderness.     Cervical back: Neck supple.  Skin:    General: Skin is warm and dry.  Neurological:     Mental Status: He is alert.  Psychiatric:     Comments: Answering question and provide the correct month and year.  Uncertain of the exact day of the week though.  Is able to tell me that he is in the emergency room.  Cranial nerves intact.  Strength is normal in all extremities.     ED Results / Procedures / Treatments   Labs (all labs ordered are listed, but only abnormal results are displayed) Labs Reviewed  CBC WITH DIFFERENTIAL/PLATELET - Abnormal; Notable for the following components:      Result Value   RBC 4.13 (*)    HCT 38.3 (*)    All other components within normal limits  COMPREHENSIVE METABOLIC PANEL - Abnormal; Notable for the following components:   Potassium 3.4 (*)    Glucose, Bld 144 (*)    BUN <5 (*)    Calcium 8.6 (*)    Albumin 2.9 (*)    AST 227 (*)    ALT 135 (*)    Alkaline Phosphatase 328 (*)    All other components within normal limits  URINALYSIS, ROUTINE W REFLEX MICROSCOPIC - Abnormal; Notable for the following components:   Color, Urine STRAW (*)    Glucose, UA 150 (*)    All other components within normal limits  ETHANOL - Abnormal;  Notable for the following components:   Alcohol, Ethyl (B) 302 (*)    All other components within normal limits  LACTIC ACID, PLASMA - Abnormal; Notable for the following components:   Lactic Acid, Venous 2.2 (*)    All other components within normal limits  LACTIC ACID, PLASMA - Abnormal; Notable for the following components:   Lactic Acid, Venous 2.3 (*)    All other components within normal limits  RAPID URINE DRUG SCREEN, HOSP PERFORMED - Abnormal; Notable for the following components:   Benzodiazepines POSITIVE (*)    All other components within normal limits  MAGNESIUM - Abnormal; Notable for the following components:   Magnesium 1.6 (*)    All other components within normal limits  CBG MONITORING, ED - Abnormal; Notable for the following components:   Glucose-Capillary 141 (*)    All other components within normal limits  BETA-HYDROXYBUTYRIC ACID    EKG EKG Interpretation  Date/Time:  Wednesday August 11 2019 15:14:16 EDT Ventricular Rate:  85 PR Interval:  176 QRS Duration: 94 QT Interval:  432 QTC Calculation: 514 R Axis:   11 Text Interpretation: Normal sinus rhythm Cannot rule out Anterior infarct , age undetermined Prolonged QT Abnormal ECG new QTc prolongation Confirmed by Aletta Edouard [phone]) on 08/11/2019 3:39:04 PM   Radiology No results found.   CT Head Wo Contrast  Result Date: 08/11/2019 CLINICAL DATA:  Encephalopathy EXAM: CT HEAD WITHOUT CONTRAST TECHNIQUE: Contiguous axial images were obtained from the base of the skull through the vertex without intravenous contrast. COMPARISON:  CT 07/06/2019 FINDINGS: Brain: Chronic dural thickening along the left frontal convexity is unchanged on multiple prior exams and likely reflects a chronic subdural hematoma measuring up to 5 mm in maximal thickness (3/21). No evidence of acute infarction, hemorrhage, hydrocephalus, new extra-axial collection or mass lesion/mass effect. Symmetric prominence of the ventricles,  cisterns and sulci compatible with mild parenchymal volume loss. Patchy areas of white matter hypoattenuation are most compatible with chronic microvascular angiopathy. Vascular: Atherosclerotic calcification of the carotid siphons. No hyperdense vessel. Skull: Bilateral craniotomies are similar to prior study. No calvarial fracture or suspicious osseous lesion. No scalp swelling or hematoma. Sinuses/Orbits: Minimal mural thickening in the left maxillary sinus, possibly odontogenic in origin given the periapical lucency of the left first maxillary molar. Remaining paranasal sinuses are predominantly clear. Included orbital structures are unremarkable. Other: None IMPRESSION: 1. No acute intracranial abnormality. 2. 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 Prior bilateral craniotomies. 3. Mild parenchymal volume loss and chronic microvascular angiopathy. Electronically Signed   By: Lovena Le M.D.   On: 08/11/2019 16:02    Procedures Procedures (including critical care time)  Medications Ordered in ED Medications  lactated ringers bolus 1,000 mL (has no administration in time range)    ED Course  I have reviewed the triage vital signs and the nursing notes.  Pertinent labs & imaging results that were available during my care of the patient were reviewed by me and considered in my medical decision making (see chart for details).  Clinical Course as of Aug 17 736  Wed Aug 11, 2019  1817 Total Protein: 6.7 [MB]    Clinical Course User Index [MB] Hayden Rasmussen, MD  MDM Rules/Calculators/A&P                      44 year old male with altered mental status.  Currently improved from what was reported prior to arrival although he still remains drowsy.  Reports blood sugars in the 400-500s over the past several days.  100s here although his roommate reportedly given him an unknown amount of insulin before EMS arrived.  He denies any  drug or alcohol use to me but, upon review of records, he has had prior admission for alcohol withdrawal seizures and hyperglycemia as recently as last month.  Could be metabolic. IV established. Will check labs. Consider seizure/post-ictal. Prescribed keppra but reports hasn been on any meds in days. IV dose given. With hx of etoh abuse (although he denies currently) and prior SDH, will also CT head.   Final Clinical Impression(s) / ED Diagnoses Final diagnoses:  Altered mental status, unspecified altered mental status type  Alcoholic intoxication without complication ([place])  Alcoholic hepatitis, unspecified whether ascites present    Rx / DC Orders ED Discharge Orders    None       Virgel Manifold, MD 08/18/19 [phone]

## 2019-08-11 NOTE — Telephone Encounter (Signed)
PT called stated he has been urinating on himself while sleeping. At least  5 times now. Patient was concerned. Please call back and advise what concerns they should have. Thanks   Lowella Grip is calling patient gave verbal auth to speak to her.

## 2019-08-11 NOTE — Telephone Encounter (Signed)
Thank you [name] for your help.

## 2019-08-11 NOTE — ED Provider Notes (Signed)
Signout from Dr. Wilson Singer.  44 year old male here with altered mental status.  Supposedly the roommate thought his blood sugar was high and so dosed him with some insulin.  Unclear amount.  EMS found him to be hypotensive and given some fluids.  He is getting lab work head CT.  Plan is to admit if mental status does not continue to improve.  But if he is better he can be discharged. Physical Exam  BP 110/72   Pulse 85   Resp 12   SpO2 100%   Physical Exam  ED Course/Procedures   Clinical Course as of Aug 10 1816  Wed Aug 11, 2019  1817 Total Protein: 6.7 [MB]    Clinical Course User Index [MB] Hayden Rasmussen, MD    Procedures  MDM  His last few admissions were in the setting of alcohol intoxication alcohol withdrawal and seizures.  It does not sound like he is compliant with medications.  Labs coming back with elevated LFTs.  They have been elevated in the past although this seems a little higher.  Normal white count.  Alcohol markedly elevated at 302.  CT head showing chronic subdural but no acute findings.  6:20 PM-more alert and conversant.  States he is hungry.  Not slurring his speech.  Will p.o. trial and likely discharge.  730 patient has eaten and drank.  He feels back to baseline and would like to be discharged.  I reviewed his findings with him including his elevation in liver enzymes.  Recommended that he consider detox.  We will give him a list of resources.      Hayden Rasmussen, MD 08/12/19 1011

## 2019-08-13 NOTE — Telephone Encounter (Signed)
lvm for patient to call back to set up appointment, ED follow up with [name].

## 2019-08-16 NOTE — Telephone Encounter (Signed)
Patient scheduled for a mychart video visit tomorrow afternoon.

## 2019-08-17 ENCOUNTER — Other Ambulatory Visit: Payer: Self-pay

## 2019-08-17 ENCOUNTER — Telehealth: Payer: Self-pay | Admitting: Family

## 2019-08-17 DIAGNOSIS — Z91199 Patient's noncompliance with other medical treatment and regimen due to unspecified reason: Secondary | ICD-10-CM

## 2019-08-17 DIAGNOSIS — Z5329 Procedure and treatment not carried out because of patient's decision for other reasons: Secondary | ICD-10-CM

## 2019-08-17 NOTE — Progress Notes (Signed)
CMA and myself tried to connect with the patient by phone and by video. We were unable to get in touch with the patient. I left a message on his voicemail advising him to call our office to reschedule.

## 2019-08-19 ENCOUNTER — Other Ambulatory Visit: Payer: Self-pay | Admitting: Family

## 2019-08-19 NOTE — Telephone Encounter (Signed)
Requesting:ativan Contract: N.A UDS:4.14.21 Last Visit:3.23.21 Next Visit:n/a Last Refill:3.17.21  Please Advise

## 2019-09-19 ENCOUNTER — Emergency Department (HOSPITAL_COMMUNITY)
Admission: EM | Admit: 2019-09-19 | Discharge: 2019-09-19 | Disposition: A | Discharge status: Home / Self Care | Payer: Medicaid Other | Attending: Emergency Medicine | Admitting: Emergency Medicine

## 2019-09-19 ENCOUNTER — Other Ambulatory Visit: Payer: Self-pay

## 2019-09-19 DIAGNOSIS — F17210 Nicotine dependence, cigarettes, uncomplicated: Secondary | ICD-10-CM | POA: Insufficient documentation

## 2019-09-19 DIAGNOSIS — Z794 Long term (current) use of insulin: Secondary | ICD-10-CM | POA: Insufficient documentation

## 2019-09-19 DIAGNOSIS — Z79899 Other long term (current) drug therapy: Secondary | ICD-10-CM | POA: Insufficient documentation

## 2019-09-19 DIAGNOSIS — R569 Unspecified convulsions: Secondary | ICD-10-CM | POA: Insufficient documentation

## 2019-09-19 DIAGNOSIS — I10 Essential (primary) hypertension: Secondary | ICD-10-CM | POA: Insufficient documentation

## 2019-09-19 DIAGNOSIS — F1010 Alcohol abuse, uncomplicated: Secondary | ICD-10-CM

## 2019-09-19 DIAGNOSIS — E119 Type 2 diabetes mellitus without complications: Secondary | ICD-10-CM | POA: Insufficient documentation

## 2019-09-19 DIAGNOSIS — F10120 Alcohol abuse with intoxication, uncomplicated: Secondary | ICD-10-CM | POA: Insufficient documentation

## 2019-09-19 DIAGNOSIS — Y907 Blood alcohol level of 200-239 mg/100 ml: Secondary | ICD-10-CM | POA: Insufficient documentation

## 2019-09-19 DIAGNOSIS — E876 Hypokalemia: Secondary | ICD-10-CM | POA: Insufficient documentation

## 2019-09-19 LAB — CBC WITH DIFFERENTIAL/PLATELET
Abs Immature Granulocytes: 0.01 10*3/uL (ref 0.00–0.07)
Basophils Absolute: 0 10*3/uL (ref 0.0–0.1)
Basophils Relative: 0 %
Eosinophils Absolute: 0 10*3/uL (ref 0.0–0.5)
Eosinophils Relative: 1 %
HCT: 29.9 % — ABNORMAL LOW (ref 39.0–52.0)
Hemoglobin: 10.3 g/dL — ABNORMAL LOW (ref 13.0–17.0)
Immature Granulocytes: 0 %
Lymphocytes Relative: 25 %
Lymphs Abs: 1.3 10*3/uL (ref 0.7–4.0)
MCH: 31.2 pg (ref 26.0–34.0)
MCHC: 34.4 g/dL (ref 30.0–36.0)
MCV: 90.6 fL (ref 80.0–100.0)
Monocytes Absolute: 0.4 10*3/uL (ref 0.1–1.0)
Monocytes Relative: 7 %
Neutro Abs: 3.5 10*3/uL (ref 1.7–7.7)
Neutrophils Relative %: 67 %
Platelets: 197 10*3/uL (ref 150–400)
RBC: 3.3 MIL/uL — ABNORMAL LOW (ref 4.22–5.81)
RDW: 13.2 % (ref 11.5–15.5)
WBC: 5.3 10*3/uL (ref 4.0–10.5)
nRBC: 0 % (ref 0.0–0.2)

## 2019-09-19 LAB — RAPID URINE DRUG SCREEN, HOSP PERFORMED
Amphetamines: NOT DETECTED
Barbiturates: NOT DETECTED
Benzodiazepines: NOT DETECTED
Cocaine: NOT DETECTED
Opiates: NOT DETECTED
Tetrahydrocannabinol: NOT DETECTED

## 2019-09-19 LAB — ETHANOL: Alcohol, Ethyl (B): 218 mg/dL — ABNORMAL HIGH (ref <10)

## 2019-09-19 LAB — BASIC METABOLIC PANEL
Anion gap: 10 (ref 5–15)
BUN: <5 mg/dL — ABNORMAL LOW (ref 6–20)
CO2: 24 mmol/L (ref 22–32)
Calcium: 7.3 mg/dL — ABNORMAL LOW (ref 8.9–10.3)
Chloride: 102 mmol/L (ref 98–111)
Creatinine, Ser: 0.66 mg/dL (ref 0.61–1.24)
GFR calc Af Amer: >60 mL/min (ref >60)
GFR calc non Af Amer: >60 mL/min (ref >60)
Glucose, Bld: 152 mg/dL — ABNORMAL HIGH (ref 70–99)
Potassium: 2.3 mmol/L — CL (ref 3.5–5.1)
Sodium: 136 mmol/L (ref 135–145)

## 2019-09-19 LAB — CBG MONITORING, ED: Glucose-Capillary: 164 mg/dL — ABNORMAL HIGH (ref 70–99)

## 2019-09-19 MED ORDER — MAGNESIUM SULFATE 2 GM/50ML IV SOLN
2.0000 g | Freq: Once | INTRAVENOUS | Status: AC
  Administered 2019-09-19: 2 g via INTRAVENOUS
  Filled 2019-09-19: qty 50

## 2019-09-19 MED ORDER — POTASSIUM CHLORIDE CRYS ER 20 MEQ PO TBCR: 40.0000 meq | EXTENDED_RELEASE_TABLET | Freq: Every day | ORAL | 0 refills | Status: AC

## 2019-09-19 MED ORDER — LEVETIRACETAM IN NACL 1000 MG/100ML IV SOLN
1000.0000 mg | Freq: Once | INTRAVENOUS | Status: AC
  Administered 2019-09-19: 1000 mg via INTRAVENOUS
  Filled 2019-09-19: qty 100

## 2019-09-19 MED ORDER — POTASSIUM CHLORIDE CRYS ER 20 MEQ PO TBCR
40.0000 meq | EXTENDED_RELEASE_TABLET | Freq: Once | ORAL | Status: AC
  Administered 2019-09-19: 40 meq via ORAL
  Filled 2019-09-19: qty 2

## 2019-09-19 MED ORDER — POTASSIUM CHLORIDE 10 MEQ/100ML IV SOLN
10.0000 meq | INTRAVENOUS | Status: AC
  Administered 2019-09-19 (×3): 10 meq via INTRAVENOUS
  Filled 2019-09-19 (×4): qty 100

## 2019-09-19 NOTE — ED Notes (Signed)
Patient ambulated to toilet unassisted /steady.

## 2019-09-19 NOTE — ED Triage Notes (Signed)
Patient arrived with EMS from a local motel girlfriend called EMS due to seizure this evening , received D50% 18 grams IV by EMS prior to arrival due to hypoglycenia 68. Patient somnolent at arrival .

## 2019-09-19 NOTE — Discharge Instructions (Signed)
You were seen today for seizure.  Your potassium was significantly low.  This can lower your seizure threshold as well as alcohol use and abuse.  Continue to take your Keppra.  Take potassium for the next 5 days and follow-up with your primary doctor for recheck.

## 2019-09-19 NOTE — ED Provider Notes (Signed)
[facility] EMERGENCY DEPARTMENT Provider Note   CSN: [id number] Arrival date & time: 09/19/19  0140     History Chief Complaint  Patient presents with  . Seizures    Willie [name] is a 44 y.o. male.  HPI     This a 44 year old male with history of seizures, alcohol abuse, diabetes, subdural hematoma who presents with seizure.  Per EMS, they were called to a local hotel after his girlfriend called.  He was noted to be having an active seizure.  By the time EMS arrived, he was not actively seizing.  He had a glucose of 68 and received an amp of D50.  He did not receive any benzodiazepines.  Reported history of seizures.  Level 5 caveat for altered mental status.  Chart review has Keppra listed as a seizure medication.  Also noted in the chart to likely have history of alcohol withdrawal seizures.  Past Medical History:  Diagnosis Date  . Alcohol abuse   . Alcoholic pancreatitis    recurrent  . Depression   . Diabetes mellitus, type II ([place])    New Onset 03/2010  . GERD (gastroesophageal reflux disease)   . High cholesterol   . 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 Hypertension   . SDH (subdural hematoma) ([place]) 12/2018   crainotomy  . Seizures ([place])   . Sleep apnea    does not wear a CPAP    Patient Active Problem List   Diagnosis Date Noted  . Alcohol withdrawal seizure ([place]) 07/07/2019  . DKA (diabetic ketoacidosis) ([place]) 07/06/2019  . OSA (obstructive sleep apnea) 07/06/2019  . Malnutrition of moderate degree 01/09/2019  . 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 Subdural bleeding ([place]) 11/24/2018  . Abdominal pain 11/22/2018  . SDH (subdural hematoma) ([place]) 11/22/2018  . Abnormal LFTs 11/22/2018  . IDDM (insulin dependent diabetes mellitus) 09/12/2018  . Acute hepatitis 09/11/2018  . Alcohol-induced mood disorder ([place])   . DKA (diabetic ketoacidoses) ([place]) 07/28/2017  . Alcohol withdrawal  seizure with complication, with unspecified complication ([place]) 52/77/8242  . Abnormal CT scan, colon   . Benign neoplasm of descending colon   . Lower GI bleed 07/04/2017  . Transaminitis 07/04/2017  . Pain   . Bleeding internal hemorrhoids   . Anemia of chronic disease   . Left arm weakness   . Anemia, blood loss   . Vomiting alone 03/27/2017  . Chest pain 03/27/2017  . Nausea and vomiting   . Lactic acidosis 03/26/2017  . History of alcohol abuse 06/25/2016  . Tobacco abuse 06/25/2016  . Anxiety state 11/20/2015  . Dental infection 07/10/2015  . Mood disorder ([place]) 04/18/2015  . Eczema 06/27/2014  . GERD (gastroesophageal reflux disease) 05/24/2014  . Snoring 04/07/2013  . Suicide attempt by substance overdose ([place]) 02/10/2013  . Alcohol abuse 12/31/2012  . Erectile dysfunction 12/09/2012  . Hematuria 06/11/2012  . Vision problem 02/02/2012  . Insomnia 12/20/2011  . Chronic pancreatitis ([place]) 02/05/2011  . Depression with anxiety 11/13/2010  . Dyslipidemia 06/27/2010  . Diabetes type 2, uncontrolled ([place]) 05/22/2010  . Essential hypertension 05/22/2010    Past Surgical History:  Procedure Laterality Date  . COLONOSCOPY W/ BIOPSIES  09/2010   Dr. Ardis Hughs.  for intermittent rectal bleeding.  Mild sigmoid to descending diverticulosis.  Mild left colon erythema, benign biopsy, probably "prep effect"  . COLONOSCOPY WITH PROPOFOL N/A 07/06/2017   Procedure: COLONOSCOPY WITH PROPOFOL;  Surgeon: Jerene Bears, MD;  Location: [facility];  Service: Gastroenterology;  Laterality: N/A;  . CRANIOTOMY Bilateral 11/24/2018   Procedure: Bilateral craniotomy for evacuation of subdural hematoma;  Surgeon: Erline Levine, MD;  Location: [facility];  Service: Neurosurgery;  Laterality: Bilateral;  Bilateral craniotomy for evacuation of subdural hematoma  . CRANIOTOMY Left 01/06/2019   Procedure: CRANIECTOMY;  Surgeon: Earnie Larsson, MD;  Location: [facility];  Service: Neurosurgery;  Laterality: Left;  .  LUMBAR MICRODISCECTOMY  ~ 2004   Dr Ellene Route       Family History  Problem Relation Age of Onset  . Diabetes Mother   . Lung cancer Brother        twin brother  . Pancreatic cancer Paternal Aunt   . Colon cancer Neg Hx   . Stomach cancer Neg Hx     Social History   Tobacco Use  . Smoking status: Current Every Day Smoker    Packs/day: 0.50    Years: 2.00    Pack years: 1.00    Types: Cigarettes    Start date: 04/29/2016  . Smokeless tobacco: Never Used  . Tobacco comment: 4 Cigarettes a day   Substance Use Topics  . Alcohol use: Yes    Comment: vodka daily  . Drug use: Not Currently    Comment: none in years    Home Medications Prior to Admission medications   Medication Sig Start Date End Date Taking? Authorizing Provider  amLODipine (NORVASC) 10 MG tablet Take 1 tablet (10 mg total) by mouth daily. 07/08/19   Mercy Riding, MD  atorvastatin (LIPITOR) 20 MG tablet Take 1 tablet (20 mg total) by mouth daily. 07/08/19 01/04/20  Mercy Riding, MD  blood glucose meter kit and supplies KIT Dispense based on patient and insurance preference. Use up to four times daily as directed. (FOR ICD-9 250.00, 250.01). 07/24/18   Debbrah Alar, NP  chlordiazePOXIDE (LIBRIUM) 25 MG capsule Take 1 capsule 3 times a day for 2 days, then 2 times a day for 2 days and then 1 times a day for 2 days 07/08/19   Mercy Riding, MD  Continuous Blood Gluc Sensor (FREESTYLE LIBRE 14 DAY SENSOR) MISC Inject 1 each into the skin every 14 (fourteen) days. 04/27/18   Debbrah Alar, NP  escitalopram (LEXAPRO) 20 MG tablet TAKE ONE TABLET BY MOUTH DAILY 07/14/19   Debbrah Alar, NP  folic acid (FOLVITE) 1 MG tablet TAKE ONE TABLET BY MOUTH DAILY Patient taking differently: Take 1 mg by mouth daily.  07/05/19   Debbrah Alar, NP  glucagon 1 MG injection Follow package directions for low blood sugar. 07/08/19 07/07/20  Mercy Riding, MD  glucose blood (FREESTYLE LITE) test strip Use as instructed  07/24/18   Debbrah Alar, NP  insulin glargine (LANTUS SOLOSTAR) 100 UNIT/ML Solostar Pen Inject 15 Units into the skin at bedtime. 08/11/19   Hayden Rasmussen, MD  insulin lispro (HUMALOG KWIKPEN) 100 UNIT/ML KwikPen Inject 0.09 mLs (9 Units total) into the skin 3 (three) times daily before meals. 07/08/19   Mercy Riding, MD  Insulin Pen Needle (ULTICARE SHORT PEN NEEDLES) 31G X 8 MM MISC Use once daily to inject insulin. 04/27/18   Debbrah Alar, NP  Lancets (FREESTYLE) lancets 2 (two) times daily 02/27/17   Debbrah Alar, NP  levETIRAcetam (KEPPRA) 500 MG tablet Take 1 tablet (500 mg total) by mouth 2 (two) times daily. 07/08/19 01/04/20  Mercy Riding, MD  LORazepam (ATIVAN) 1 MG  tablet TAKE ONE TABLET BY MOUTH EVERY 8 HOURS AS NEEDED FOR ANXIETY 08/23/19   Debbrah Alar, NP  Multiple Vitamins-Minerals (MULTIVITAMIN) tablet Take 1 tablet by mouth daily. 01/14/19   Erline Levine, MD  pantoprazole (PROTONIX) 40 MG tablet Take 1 tablet (40 mg total) by mouth 2 (two) times daily. 01/14/19   Erline Levine, MD  potassium chloride SA (KLOR-CON) 20 MEQ tablet Take 2 tablets (40 mEq total) by mouth daily. 09/19/19   Hardy Harcum, Barbette Hair, MD  QUEtiapine (SEROQUEL) 300 MG tablet TAKE ONE TABLET BY MOUTH AT BEDTIME 07/14/19   Debbrah Alar, NP  sildenafil (REVATIO) 20 MG tablet TAKE 1-2 TABLETS BY MOUTH PRIOR TO SEXUAL ACTIVITY Patient taking differently: Take 20-40 mg by mouth See admin instructions. Prior to sexual acivity 03/22/19   Debbrah Alar, NP  thiamine 100 MG tablet Take 1 tablet (100 mg total) by mouth daily. 07/09/19   Mercy Riding, MD    Allergies    Invokamet [canagliflozin-metformin hcl] and Metformin and related  Review of Systems   Review of Systems  Unable to perform ROS: Mental status change    Physical Exam Updated Vital Signs BP 118/89 (BP Location: Right Arm)   Pulse 92   Temp (!) 97.5 F (36.4 C) (Oral)   Resp 18   Ht 1.829 m (6')   Wt 80 kg    SpO2 100%   BMI 23.92 kg/m   Physical Exam Vitals and nursing note reviewed.  Constitutional:      Appearance: He is well-developed.     Comments: Somnolent, arousable to painful stimuli  HENT:     Head: Normocephalic and atraumatic.     Nose: Nose normal.     Mouth/Throat:     Mouth: Mucous membranes are moist.  Eyes:     Pupils: Pupils are equal, round, and reactive to light.     Comments: Pupils 3 mm reactive bilaterally  Cardiovascular:     Rate and Rhythm: Normal rate and regular rhythm.     Heart sounds: Normal heart sounds. No murmur.  Pulmonary:     Effort: Pulmonary effort is normal. No respiratory distress.     Breath sounds: Normal breath sounds. No wheezing.  Abdominal:     General: Bowel sounds are normal.     Palpations: Abdomen is soft.     Tenderness: There is no abdominal tenderness. There is no rebound.  Musculoskeletal:     Cervical back: Neck supple.     Right lower leg: No edema.     Left lower leg: No edema.  Skin:    General: Skin is warm and dry.  Neurological:     Comments: Appears to move all 4 extremities spontaneously, does not follow commands  Psychiatric:     Comments: Unable to assess     ED Results / Procedures / Treatments   Labs (all labs ordered are listed, but only abnormal results are displayed) Labs Reviewed  CBC WITH DIFFERENTIAL/PLATELET - Abnormal; Notable for the following components:      Result Value   RBC 3.30 (*)    Hemoglobin 10.3 (*)    HCT 29.9 (*)    All other components within normal limits  BASIC METABOLIC PANEL - Abnormal; Notable for the following components:   Potassium 2.3 (*)    Glucose, Bld 152 (*)    BUN <5 (*)    Calcium 7.3 (*)    All other components within normal limits  ETHANOL - Abnormal; Notable for the  following components:   Alcohol, Ethyl (B) 218 (*)    All other components within normal limits  CBG MONITORING, ED - Abnormal; Notable for the following components:   Glucose-Capillary 164  (*)    All other components within normal limits  RAPID URINE DRUG SCREEN, HOSP PERFORMED    EKG EKG Interpretation  Date/Time:  Sunday Sep 19 2019 01:43:47 EDT Ventricular Rate:  89 PR Interval:    QRS Duration: 104 QT Interval:  411 QTC Calculation: 501 R Axis:   42 Text Interpretation: Sinus rhythm Prolonged QT interval Confirmed by Thayer Jew [phone]) on 09/19/2019 3:02:49 AM   Radiology No results found.  Procedures Procedures (including critical care time)  CRITICAL CARE Performed by: Merryl Hacker   Total critical care time: 50 minutes  Critical care time was exclusive of separately billable procedures and treating other patients.  Critical care was necessary to treat or prevent imminent or life-threatening deterioration.  Critical care was time spent personally by me on the following activities: development of treatment plan with patient and/or surrogate as well as nursing, discussions with consultants, evaluation of patient's response to treatment, examination of patient, obtaining history from patient or surrogate, ordering and performing treatments and interventions, ordering and review of laboratory studies, ordering and review of radiographic studies, pulse oximetry and re-evaluation of patient's condition.   Medications Ordered in ED Medications  levETIRAcetam (KEPPRA) IVPB 1000 mg/100 mL premix (1,000 mg Intravenous New Bag/Given 09/19/19 0659)  potassium chloride 10 mEq in 100 mL IVPB (0 mEq Intravenous Stopped 09/19/19 0626)  magnesium sulfate IVPB 2 g 50 mL (0 g Intravenous Stopped 09/19/19 0337)  potassium chloride SA (KLOR-CON) CR tablet 40 mEq (40 mEq Oral Given 09/19/19 4163)    ED Course  I have reviewed the triage vital signs and the nursing notes.  Pertinent labs & imaging results that were available during my care of the patient were reviewed by me and considered in my medical decision making (see chart for details).  Clinical Course as  of Sep 19 707  Sun Sep 19, 2019  0543 Patient receiving potassium.  Still clinically intoxicated.   [CH]    Clinical Course User Index [CH] Vonnie Spagnolo, Barbette Hair, MD   MDM Rules/Calculators/A&P                       Patient presents following seizure activity.  History of seizure and reportedly on Keppra.  On my initial evaluation he is unable provide history and appears postictal.  He also has a significant history of alcohol use and abuse.  Lab work obtained including blood alcohol level.  Lab work notable for a potassium of 2.3.  No significant EKG changes.  This certainly could lower the patient's seizure threshold as good ongoing alcohol use and abuse.  Patient was given 3 rounds of IV potassium as well as oral potassium and IV magnesium.  Blood alcohol level 218.  Altered mental status likely accommodation of being postictal and intoxicated.  7:19 AM Patient is now ambulatory independently.  He received a loading dose of Keppra.  He has been able to eat without difficulty.  Discussed with him that he needs to take oral potassium for the next 5 days and get a recheck by his primary physician.  Additionally he needs to continue with his antiepileptics and avoid anything that would lower his seizure threshold.  After history, exam, and medical workup I feel the patient has been appropriately medically screened and  is safe for discharge home. Pertinent diagnoses were discussed with the patient. Patient was given return precautions.   Final Clinical Impression(s) / ED Diagnoses Final diagnoses:  Seizure ([place])  Hypokalemia  Alcohol abuse    Rx / DC Orders ED Discharge Orders         Ordered    potassium chloride SA (KLOR-CON) 20 MEQ tablet  Daily     09/19/19 0707           Merryl Hacker, MD 09/19/19 [phone]

## 2019-10-01 ENCOUNTER — Ambulatory Visit: Payer: Medicaid Other | Admitting: Family

## 2019-10-10 ENCOUNTER — Other Ambulatory Visit: Payer: Self-pay | Admitting: Family

## 2019-10-11 ENCOUNTER — Ambulatory Visit: Payer: Medicaid Other | Admitting: Family

## 2019-10-13 ENCOUNTER — Telehealth: Payer: Self-pay | Admitting: Family

## 2019-10-13 NOTE — Telephone Encounter (Signed)
I contacted the family Mother Rod Holler and Eugene Gavia to inquire about manner of death. Willie [name] told me that the patient was found "unresponsive" at home and they, "worked on him for an hour."  I gave my condolences.

## 2019-10-13 NOTE — Telephone Encounter (Signed)
[employer] dropped off death certificate [id number].

## 2019-10-28 DEATH — deceased

## 2020-01-13 ENCOUNTER — Other Ambulatory Visit: Payer: Self-pay | Admitting: Family

## 2020-01-15 ENCOUNTER — Other Ambulatory Visit: Payer: Self-pay | Admitting: Family

## 2020-01-16 ENCOUNTER — Other Ambulatory Visit: Payer: Self-pay | Admitting: Family
